# Patient Record
Sex: Male | Born: 1940
Health system: Southern US, Community
[De-identification: ages and names within clinical notes are randomized; demographics above are authoritative.]

## PROBLEM LIST (undated history)

## (undated) DIAGNOSIS — M1710 Unilateral primary osteoarthritis, unspecified knee: Secondary | ICD-10-CM

## (undated) DIAGNOSIS — Q2112 Patent foramen ovale: Secondary | ICD-10-CM

## (undated) DIAGNOSIS — K5900 Constipation, unspecified: Secondary | ICD-10-CM

## (undated) DIAGNOSIS — K922 Gastrointestinal hemorrhage, unspecified: Secondary | ICD-10-CM

## (undated) DIAGNOSIS — M19019 Primary osteoarthritis, unspecified shoulder: Secondary | ICD-10-CM

## (undated) DIAGNOSIS — R55 Syncope and collapse: Secondary | ICD-10-CM

## (undated) DIAGNOSIS — IMO0002 Reserved for concepts with insufficient information to code with codable children: Secondary | ICD-10-CM

## (undated) DIAGNOSIS — C801 Malignant (primary) neoplasm, unspecified: Secondary | ICD-10-CM

## (undated) DIAGNOSIS — G5600 Carpal tunnel syndrome, unspecified upper limb: Secondary | ICD-10-CM

## (undated) DIAGNOSIS — Q211 Atrial septal defect: Secondary | ICD-10-CM

## (undated) DIAGNOSIS — D509 Iron deficiency anemia, unspecified: Secondary | ICD-10-CM

## (undated) DIAGNOSIS — M6281 Muscle weakness (generalized): Secondary | ICD-10-CM

## (undated) DIAGNOSIS — I10 Essential (primary) hypertension: Secondary | ICD-10-CM

## (undated) DIAGNOSIS — G894 Chronic pain syndrome: Secondary | ICD-10-CM

## (undated) DIAGNOSIS — M4712 Other spondylosis with myelopathy, cervical region: Secondary | ICD-10-CM

## (undated) DIAGNOSIS — M47817 Spondylosis without myelopathy or radiculopathy, lumbosacral region: Secondary | ICD-10-CM

## (undated) DIAGNOSIS — E785 Hyperlipidemia, unspecified: Secondary | ICD-10-CM

## (undated) DIAGNOSIS — H409 Unspecified glaucoma: Secondary | ICD-10-CM

## (undated) DIAGNOSIS — F329 Major depressive disorder, single episode, unspecified: Secondary | ICD-10-CM

## (undated) DIAGNOSIS — T884XXA Failed or difficult intubation, initial encounter: Secondary | ICD-10-CM

## (undated) DIAGNOSIS — Z9889 Other specified postprocedural states: Secondary | ICD-10-CM

## (undated) DIAGNOSIS — F32A Depression, unspecified: Secondary | ICD-10-CM

## (undated) DIAGNOSIS — I4821 Permanent atrial fibrillation: Secondary | ICD-10-CM

## (undated) DIAGNOSIS — K2100 Gastro-esophageal reflux disease with esophagitis, without bleeding: Secondary | ICD-10-CM

## (undated) DIAGNOSIS — I639 Cerebral infarction, unspecified: Secondary | ICD-10-CM

## (undated) DIAGNOSIS — M4800 Spinal stenosis, site unspecified: Secondary | ICD-10-CM

## (undated) DIAGNOSIS — M5090 Cervical disc disorder, unspecified, unspecified cervical region: Secondary | ICD-10-CM

## (undated) DIAGNOSIS — I350 Nonrheumatic aortic (valve) stenosis: Secondary | ICD-10-CM

## (undated) DIAGNOSIS — R1313 Dysphagia, pharyngeal phase: Secondary | ICD-10-CM

## (undated) HISTORY — DX: Gastro-esophageal reflux disease with esophagitis, without bleeding: K21.00

## (undated) HISTORY — DX: Unilateral primary osteoarthritis, unspecified knee: M17.10

## (undated) HISTORY — DX: Depression, unspecified: F32.A

## (undated) HISTORY — DX: Hyperlipidemia, unspecified: E78.5

## (undated) HISTORY — DX: Primary osteoarthritis, unspecified shoulder: M19.019

## (undated) HISTORY — DX: Spinal stenosis, site unspecified: M48.00

## (undated) HISTORY — DX: Spondylosis without myelopathy or radiculopathy, lumbosacral region: M47.817

## (undated) HISTORY — DX: Chronic pain syndrome: G89.4

## (undated) HISTORY — DX: Gastrointestinal hemorrhage, unspecified: K92.2

## (undated) HISTORY — DX: Cervical disc disorder, unspecified, unspecified cervical region: M50.90

## (undated) HISTORY — PX: OTHER SURGICAL HISTORY: SHX169

## (undated) HISTORY — DX: Other spondylosis with myelopathy, cervical region: M47.12

## (undated) HISTORY — DX: Muscle weakness (generalized): M62.81

## (undated) HISTORY — DX: Essential (primary) hypertension: I10

## (undated) HISTORY — PX: JOINT REPLACEMENT: SHX530

## (undated) HISTORY — DX: Syncope and collapse: R55

## (undated) HISTORY — DX: Carpal tunnel syndrome, unspecified upper limb: G56.00

## (undated) HISTORY — DX: Reserved for concepts with insufficient information to code with codable children: IMO0002

## (undated) HISTORY — DX: Iron deficiency anemia, unspecified: D50.9

## (undated) HISTORY — PX: SPINE SURGERY: SHX786

## (undated) HISTORY — DX: Constipation, unspecified: K59.00

## (undated) HISTORY — DX: Dysphagia, pharyngeal phase: R13.13

## (undated) HISTORY — DX: Major depressive disorder, single episode, unspecified: F32.9

## (undated) HISTORY — DX: Gastro-esophageal reflux disease with esophagitis: K21.0

## (undated) HISTORY — DX: Unspecified glaucoma: H40.9

---

## 1998-04-06 ENCOUNTER — Encounter: Admission: RE | Admit: 1998-04-06 | Discharge: 1998-04-06 | Payer: Self-pay | Admitting: Family Medicine

## 1998-04-20 ENCOUNTER — Encounter: Admission: RE | Admit: 1998-04-20 | Discharge: 1998-04-20 | Payer: Self-pay | Admitting: Family Medicine

## 1998-06-15 ENCOUNTER — Encounter: Admission: RE | Admit: 1998-06-15 | Discharge: 1998-06-15 | Payer: Self-pay | Admitting: Family Medicine

## 1998-06-24 ENCOUNTER — Encounter: Admission: RE | Admit: 1998-06-24 | Discharge: 1998-06-24 | Payer: Self-pay | Admitting: Family Medicine

## 1998-07-08 ENCOUNTER — Encounter: Admission: RE | Admit: 1998-07-08 | Discharge: 1998-07-08 | Payer: Self-pay | Admitting: Family Medicine

## 1998-07-11 ENCOUNTER — Encounter: Admission: RE | Admit: 1998-07-11 | Discharge: 1998-10-09 | Payer: Self-pay | Admitting: Family Medicine

## 1998-10-07 ENCOUNTER — Encounter: Admission: RE | Admit: 1998-10-07 | Discharge: 1998-10-07 | Payer: Self-pay | Admitting: Family Medicine

## 1998-10-24 ENCOUNTER — Encounter: Admission: RE | Admit: 1998-10-24 | Discharge: 1998-10-24 | Payer: Self-pay | Admitting: Family Medicine

## 1999-07-11 ENCOUNTER — Encounter: Admission: RE | Admit: 1999-07-11 | Discharge: 1999-07-11 | Payer: Self-pay | Admitting: Family Medicine

## 1999-12-18 HISTORY — PX: OTHER SURGICAL HISTORY: SHX169

## 2000-02-02 ENCOUNTER — Encounter: Admission: RE | Admit: 2000-02-02 | Discharge: 2000-02-02 | Payer: Self-pay | Admitting: Family Medicine

## 2000-02-05 ENCOUNTER — Ambulatory Visit (HOSPITAL_COMMUNITY): Admission: RE | Admit: 2000-02-05 | Discharge: 2000-02-05 | Payer: Self-pay | Admitting: Family Medicine

## 2000-02-08 ENCOUNTER — Encounter: Admission: RE | Admit: 2000-02-08 | Discharge: 2000-02-08 | Payer: Self-pay | Admitting: Sports Medicine

## 2000-02-26 ENCOUNTER — Encounter: Payer: Self-pay | Admitting: Sports Medicine

## 2000-02-26 ENCOUNTER — Encounter: Admission: RE | Admit: 2000-02-26 | Discharge: 2000-02-26 | Payer: Self-pay

## 2000-02-28 ENCOUNTER — Encounter: Payer: Self-pay | Admitting: Family Medicine

## 2000-02-28 ENCOUNTER — Emergency Department (HOSPITAL_COMMUNITY): Admission: EM | Admit: 2000-02-28 | Discharge: 2000-02-28 | Payer: Self-pay | Admitting: Emergency Medicine

## 2000-03-01 ENCOUNTER — Encounter: Admission: RE | Admit: 2000-03-01 | Discharge: 2000-03-01 | Payer: Self-pay | Admitting: Family Medicine

## 2000-03-13 ENCOUNTER — Encounter: Payer: Self-pay | Admitting: Family Medicine

## 2000-03-13 ENCOUNTER — Encounter: Admission: RE | Admit: 2000-03-13 | Discharge: 2000-03-13 | Payer: Self-pay | Admitting: Family Medicine

## 2000-04-19 ENCOUNTER — Encounter: Admission: RE | Admit: 2000-04-19 | Discharge: 2000-04-19 | Payer: Self-pay | Admitting: Family Medicine

## 2001-02-28 ENCOUNTER — Encounter: Payer: Self-pay | Admitting: Internal Medicine

## 2001-02-28 ENCOUNTER — Encounter: Admission: RE | Admit: 2001-02-28 | Discharge: 2001-02-28 | Payer: Self-pay | Admitting: Internal Medicine

## 2001-04-15 ENCOUNTER — Encounter: Admission: RE | Admit: 2001-04-15 | Discharge: 2001-04-15 | Payer: Self-pay | Admitting: Sports Medicine

## 2001-04-30 ENCOUNTER — Encounter: Admission: RE | Admit: 2001-04-30 | Discharge: 2001-04-30 | Payer: Self-pay | Admitting: Family Medicine

## 2001-05-14 ENCOUNTER — Encounter: Admission: RE | Admit: 2001-05-14 | Discharge: 2001-05-14 | Payer: Self-pay | Admitting: Family Medicine

## 2002-02-25 ENCOUNTER — Encounter: Admission: RE | Admit: 2002-02-25 | Discharge: 2002-02-25 | Payer: Self-pay | Admitting: Family Medicine

## 2003-03-08 ENCOUNTER — Encounter: Admission: RE | Admit: 2003-03-08 | Discharge: 2003-03-08 | Payer: Self-pay | Admitting: Emergency Medicine

## 2003-03-08 ENCOUNTER — Encounter: Payer: Self-pay | Admitting: Emergency Medicine

## 2003-03-29 ENCOUNTER — Encounter: Payer: Self-pay | Admitting: Emergency Medicine

## 2003-03-29 ENCOUNTER — Encounter: Admission: RE | Admit: 2003-03-29 | Discharge: 2003-03-29 | Payer: Self-pay | Admitting: Emergency Medicine

## 2003-06-09 ENCOUNTER — Encounter: Admission: RE | Admit: 2003-06-09 | Discharge: 2003-06-09 | Payer: Self-pay | Admitting: Family Medicine

## 2003-11-17 ENCOUNTER — Encounter: Admission: RE | Admit: 2003-11-17 | Discharge: 2003-11-17 | Payer: Self-pay | Admitting: Family Medicine

## 2003-11-17 ENCOUNTER — Encounter: Admission: RE | Admit: 2003-11-17 | Discharge: 2003-11-17 | Payer: Self-pay | Admitting: Sports Medicine

## 2003-11-30 ENCOUNTER — Encounter: Admission: RE | Admit: 2003-11-30 | Discharge: 2003-11-30 | Payer: Self-pay | Admitting: Sports Medicine

## 2003-11-30 ENCOUNTER — Ambulatory Visit (HOSPITAL_COMMUNITY): Admission: RE | Admit: 2003-11-30 | Discharge: 2003-11-30 | Payer: Self-pay | Admitting: Sports Medicine

## 2003-12-01 ENCOUNTER — Encounter: Admission: RE | Admit: 2003-12-01 | Discharge: 2003-12-01 | Payer: Self-pay | Admitting: Sports Medicine

## 2003-12-07 ENCOUNTER — Encounter: Admission: RE | Admit: 2003-12-07 | Discharge: 2003-12-07 | Payer: Self-pay | Admitting: Sports Medicine

## 2003-12-08 ENCOUNTER — Encounter: Admission: RE | Admit: 2003-12-08 | Discharge: 2003-12-08 | Payer: Self-pay | Admitting: Sports Medicine

## 2003-12-15 ENCOUNTER — Ambulatory Visit (HOSPITAL_COMMUNITY): Admission: RE | Admit: 2003-12-15 | Discharge: 2003-12-15 | Payer: Self-pay | Admitting: Sports Medicine

## 2003-12-29 ENCOUNTER — Encounter: Admission: RE | Admit: 2003-12-29 | Discharge: 2003-12-29 | Payer: Self-pay | Admitting: Family Medicine

## 2004-02-09 ENCOUNTER — Encounter: Admission: RE | Admit: 2004-02-09 | Discharge: 2004-02-09 | Payer: Self-pay | Admitting: Family Medicine

## 2004-02-17 ENCOUNTER — Encounter: Admission: RE | Admit: 2004-02-17 | Discharge: 2004-02-17 | Payer: Self-pay | Admitting: Neurosurgery

## 2004-04-18 ENCOUNTER — Inpatient Hospital Stay (HOSPITAL_COMMUNITY): Admission: RE | Admit: 2004-04-18 | Discharge: 2004-04-21 | Payer: Self-pay | Admitting: Neurosurgery

## 2004-12-04 ENCOUNTER — Inpatient Hospital Stay (HOSPITAL_COMMUNITY): Admission: EM | Admit: 2004-12-04 | Discharge: 2004-12-07 | Payer: Self-pay | Admitting: Emergency Medicine

## 2004-12-04 ENCOUNTER — Ambulatory Visit: Payer: Self-pay | Admitting: Family Medicine

## 2004-12-13 ENCOUNTER — Ambulatory Visit: Payer: Self-pay | Admitting: Family Medicine

## 2004-12-17 HISTORY — PX: SHOULDER OPEN ROTATOR CUFF REPAIR: SHX2407

## 2005-03-07 ENCOUNTER — Ambulatory Visit: Payer: Self-pay | Admitting: Family Medicine

## 2005-03-22 ENCOUNTER — Ambulatory Visit: Payer: Self-pay | Admitting: Family Medicine

## 2005-03-28 ENCOUNTER — Encounter: Admission: RE | Admit: 2005-03-28 | Discharge: 2005-03-28 | Payer: Self-pay | Admitting: Neurosurgery

## 2005-04-11 ENCOUNTER — Ambulatory Visit: Payer: Self-pay | Admitting: Family Medicine

## 2005-04-23 ENCOUNTER — Ambulatory Visit: Payer: Self-pay | Admitting: Family Medicine

## 2005-05-18 ENCOUNTER — Inpatient Hospital Stay (HOSPITAL_COMMUNITY): Admission: RE | Admit: 2005-05-18 | Discharge: 2005-05-28 | Payer: Self-pay | Admitting: Neurosurgery

## 2005-06-20 ENCOUNTER — Ambulatory Visit: Payer: Self-pay | Admitting: Family Medicine

## 2005-08-15 ENCOUNTER — Ambulatory Visit: Payer: Self-pay | Admitting: Family Medicine

## 2005-09-07 ENCOUNTER — Ambulatory Visit (HOSPITAL_COMMUNITY): Admission: RE | Admit: 2005-09-07 | Discharge: 2005-09-08 | Payer: Self-pay | Admitting: Orthopaedic Surgery

## 2005-09-12 ENCOUNTER — Ambulatory Visit: Payer: Self-pay | Admitting: Family Medicine

## 2005-10-09 ENCOUNTER — Ambulatory Visit (HOSPITAL_COMMUNITY): Admission: RE | Admit: 2005-10-09 | Discharge: 2005-10-09 | Payer: Self-pay | Admitting: Orthopaedic Surgery

## 2005-11-21 ENCOUNTER — Ambulatory Visit: Payer: Self-pay | Admitting: Family Medicine

## 2005-12-03 ENCOUNTER — Ambulatory Visit: Payer: Self-pay | Admitting: Family Medicine

## 2005-12-17 HISTORY — PX: COLONOSCOPY: SHX174

## 2005-12-19 ENCOUNTER — Ambulatory Visit: Payer: Self-pay | Admitting: Family Medicine

## 2006-01-16 ENCOUNTER — Ambulatory Visit: Payer: Self-pay | Admitting: Family Medicine

## 2006-02-06 ENCOUNTER — Ambulatory Visit (HOSPITAL_COMMUNITY): Admission: RE | Admit: 2006-02-06 | Discharge: 2006-02-06 | Payer: Self-pay | Admitting: Gastroenterology

## 2006-02-20 ENCOUNTER — Ambulatory Visit: Payer: Self-pay | Admitting: Family Medicine

## 2006-03-13 ENCOUNTER — Ambulatory Visit: Payer: Self-pay | Admitting: Family Medicine

## 2006-05-09 ENCOUNTER — Ambulatory Visit (HOSPITAL_COMMUNITY): Admission: RE | Admit: 2006-05-09 | Discharge: 2006-05-09 | Payer: Self-pay | Admitting: Anesthesiology

## 2006-05-20 ENCOUNTER — Encounter: Admission: RE | Admit: 2006-05-20 | Discharge: 2006-05-20 | Payer: Self-pay | Admitting: Sports Medicine

## 2006-05-20 ENCOUNTER — Ambulatory Visit: Payer: Self-pay | Admitting: Family Medicine

## 2006-05-22 ENCOUNTER — Ambulatory Visit: Payer: Self-pay | Admitting: Family Medicine

## 2006-06-17 ENCOUNTER — Ambulatory Visit: Payer: Self-pay | Admitting: Family Medicine

## 2006-06-18 ENCOUNTER — Encounter: Admission: RE | Admit: 2006-06-18 | Discharge: 2006-06-18 | Payer: Self-pay | Admitting: Family Medicine

## 2006-07-01 ENCOUNTER — Ambulatory Visit: Payer: Self-pay | Admitting: Family Medicine

## 2006-07-31 ENCOUNTER — Ambulatory Visit: Payer: Self-pay | Admitting: Family Medicine

## 2006-08-21 ENCOUNTER — Ambulatory Visit: Payer: Self-pay | Admitting: Family Medicine

## 2006-08-26 ENCOUNTER — Encounter: Admission: RE | Admit: 2006-08-26 | Discharge: 2006-11-24 | Payer: Self-pay | Admitting: Family Medicine

## 2006-09-12 ENCOUNTER — Encounter: Admission: RE | Admit: 2006-09-12 | Discharge: 2006-09-12 | Payer: Self-pay | Admitting: Neurosurgery

## 2006-10-17 ENCOUNTER — Ambulatory Visit: Payer: Self-pay | Admitting: Sports Medicine

## 2006-10-17 ENCOUNTER — Ambulatory Visit (HOSPITAL_COMMUNITY): Admission: RE | Admit: 2006-10-17 | Discharge: 2006-10-17 | Payer: Self-pay | Admitting: Family Medicine

## 2006-10-25 ENCOUNTER — Encounter: Admission: RE | Admit: 2006-10-25 | Discharge: 2006-10-25 | Payer: Self-pay | Admitting: Neurosurgery

## 2006-11-06 ENCOUNTER — Encounter: Admission: RE | Admit: 2006-11-06 | Discharge: 2006-11-06 | Payer: Self-pay | Admitting: Neurosurgery

## 2006-11-18 ENCOUNTER — Inpatient Hospital Stay (HOSPITAL_COMMUNITY): Admission: RE | Admit: 2006-11-18 | Discharge: 2006-11-22 | Payer: Self-pay | Admitting: Orthopedic Surgery

## 2007-01-01 ENCOUNTER — Encounter (INDEPENDENT_AMBULATORY_CARE_PROVIDER_SITE_OTHER): Payer: Self-pay | Admitting: Family Medicine

## 2007-01-01 ENCOUNTER — Ambulatory Visit: Payer: Self-pay | Admitting: Sports Medicine

## 2007-01-01 LAB — CONVERTED CEMR LAB
BUN: 20 mg/dL (ref 6–23)
CO2: 30 meq/L (ref 19–32)
Glucose, Bld: 77 mg/dL (ref 70–99)
Potassium: 4.8 meq/L (ref 3.5–5.3)
TSH: 1.571 microintl units/mL (ref 0.350–5.50)

## 2007-01-28 ENCOUNTER — Inpatient Hospital Stay (HOSPITAL_COMMUNITY): Admission: AD | Admit: 2007-01-28 | Discharge: 2007-01-31 | Payer: Self-pay | Admitting: Neurosurgery

## 2007-02-06 ENCOUNTER — Ambulatory Visit: Payer: Self-pay | Admitting: Family Medicine

## 2007-02-19 ENCOUNTER — Telehealth: Payer: Self-pay | Admitting: *Deleted

## 2007-03-07 ENCOUNTER — Encounter (INDEPENDENT_AMBULATORY_CARE_PROVIDER_SITE_OTHER): Payer: Self-pay | Admitting: Family Medicine

## 2007-03-07 ENCOUNTER — Ambulatory Visit: Payer: Self-pay | Admitting: Family Medicine

## 2007-03-07 DIAGNOSIS — I10 Essential (primary) hypertension: Secondary | ICD-10-CM

## 2007-03-07 DIAGNOSIS — E7800 Pure hypercholesterolemia, unspecified: Secondary | ICD-10-CM

## 2007-03-07 LAB — CONVERTED CEMR LAB
ALT: 13 units/L (ref 0–53)
BUN: 22 mg/dL (ref 6–23)
CO2: 27 meq/L (ref 19–32)
Cholesterol: 151 mg/dL (ref 0–200)
Creatinine, Ser: 0.77 mg/dL (ref 0.40–1.50)
HDL: 45 mg/dL (ref >39)
Total Bilirubin: 0.2 mg/dL — ABNORMAL LOW (ref 0.3–1.2)
Total CHOL/HDL Ratio: 3.4
Triglycerides: 127 mg/dL (ref <150)
VLDL: 25 mg/dL (ref 0–40)

## 2007-03-13 ENCOUNTER — Telehealth: Payer: Self-pay | Admitting: *Deleted

## 2007-04-08 ENCOUNTER — Ambulatory Visit: Payer: Self-pay | Admitting: Family Medicine

## 2007-04-08 ENCOUNTER — Encounter (INDEPENDENT_AMBULATORY_CARE_PROVIDER_SITE_OTHER): Payer: Self-pay | Admitting: Family Medicine

## 2007-04-08 DIAGNOSIS — Q762 Congenital spondylolisthesis: Secondary | ICD-10-CM

## 2007-04-08 DIAGNOSIS — G8929 Other chronic pain: Secondary | ICD-10-CM | POA: Insufficient documentation

## 2007-04-08 DIAGNOSIS — F411 Generalized anxiety disorder: Secondary | ICD-10-CM

## 2007-04-08 DIAGNOSIS — M159 Polyosteoarthritis, unspecified: Secondary | ICD-10-CM | POA: Insufficient documentation

## 2007-04-08 DIAGNOSIS — N3941 Urge incontinence: Secondary | ICD-10-CM

## 2007-04-08 LAB — CONVERTED CEMR LAB
Bilirubin Urine: NEGATIVE
Nitrite: NEGATIVE
Protein, U semiquant: NEGATIVE
Urobilinogen, UA: 0.2
WBC Urine, dipstick: NEGATIVE

## 2007-04-09 ENCOUNTER — Encounter (INDEPENDENT_AMBULATORY_CARE_PROVIDER_SITE_OTHER): Payer: Self-pay | Admitting: Family Medicine

## 2007-04-09 ENCOUNTER — Telehealth (INDEPENDENT_AMBULATORY_CARE_PROVIDER_SITE_OTHER): Payer: Self-pay | Admitting: Family Medicine

## 2007-04-14 ENCOUNTER — Telehealth: Payer: Self-pay | Admitting: *Deleted

## 2007-04-18 ENCOUNTER — Telehealth (INDEPENDENT_AMBULATORY_CARE_PROVIDER_SITE_OTHER): Payer: Self-pay | Admitting: Family Medicine

## 2007-04-25 ENCOUNTER — Telehealth (INDEPENDENT_AMBULATORY_CARE_PROVIDER_SITE_OTHER): Payer: Self-pay | Admitting: Family Medicine

## 2007-05-05 ENCOUNTER — Encounter (INDEPENDENT_AMBULATORY_CARE_PROVIDER_SITE_OTHER): Payer: Self-pay | Admitting: Family Medicine

## 2007-05-05 ENCOUNTER — Ambulatory Visit: Payer: Self-pay | Admitting: Sports Medicine

## 2007-05-05 ENCOUNTER — Telehealth (INDEPENDENT_AMBULATORY_CARE_PROVIDER_SITE_OTHER): Payer: Self-pay | Admitting: Family Medicine

## 2007-05-05 DIAGNOSIS — IMO0002 Reserved for concepts with insufficient information to code with codable children: Secondary | ICD-10-CM | POA: Insufficient documentation

## 2007-05-06 ENCOUNTER — Ambulatory Visit: Payer: Self-pay | Admitting: Family Medicine

## 2007-05-08 ENCOUNTER — Encounter: Payer: Self-pay | Admitting: *Deleted

## 2007-05-09 ENCOUNTER — Inpatient Hospital Stay (HOSPITAL_COMMUNITY): Admission: RE | Admit: 2007-05-09 | Discharge: 2007-05-12 | Payer: Self-pay | Admitting: Orthopedic Surgery

## 2007-05-22 ENCOUNTER — Telehealth (INDEPENDENT_AMBULATORY_CARE_PROVIDER_SITE_OTHER): Payer: Self-pay | Admitting: Family Medicine

## 2007-05-23 ENCOUNTER — Telehealth: Payer: Self-pay | Admitting: *Deleted

## 2007-06-04 ENCOUNTER — Ambulatory Visit: Payer: Self-pay | Admitting: Family Medicine

## 2007-06-04 DIAGNOSIS — Z96659 Presence of unspecified artificial knee joint: Secondary | ICD-10-CM

## 2007-07-10 ENCOUNTER — Encounter: Admission: RE | Admit: 2007-07-10 | Discharge: 2007-07-10 | Payer: Self-pay | Admitting: Neurosurgery

## 2007-07-14 ENCOUNTER — Telehealth (INDEPENDENT_AMBULATORY_CARE_PROVIDER_SITE_OTHER): Payer: Self-pay | Admitting: Family Medicine

## 2007-07-22 ENCOUNTER — Emergency Department (HOSPITAL_COMMUNITY): Admission: EM | Admit: 2007-07-22 | Discharge: 2007-07-22 | Payer: Self-pay | Admitting: Emergency Medicine

## 2007-08-22 ENCOUNTER — Inpatient Hospital Stay (HOSPITAL_COMMUNITY): Admission: RE | Admit: 2007-08-22 | Discharge: 2007-08-27 | Payer: Self-pay | Admitting: Neurosurgery

## 2007-12-18 HISTORY — PX: OTHER SURGICAL HISTORY: SHX169

## 2008-01-06 ENCOUNTER — Ambulatory Visit (HOSPITAL_BASED_OUTPATIENT_CLINIC_OR_DEPARTMENT_OTHER): Admission: RE | Admit: 2008-01-06 | Discharge: 2008-01-06 | Payer: Self-pay | Admitting: Orthopedic Surgery

## 2008-01-06 HISTORY — PX: OTHER SURGICAL HISTORY: SHX169

## 2008-01-14 ENCOUNTER — Encounter: Admission: RE | Admit: 2008-01-14 | Discharge: 2008-01-14 | Payer: Self-pay | Admitting: Gastroenterology

## 2008-01-29 ENCOUNTER — Encounter: Admission: RE | Admit: 2008-01-29 | Discharge: 2008-02-17 | Payer: Self-pay | Admitting: Neurology

## 2008-03-04 ENCOUNTER — Inpatient Hospital Stay (HOSPITAL_COMMUNITY): Admission: EM | Admit: 2008-03-04 | Discharge: 2008-03-09 | Payer: Self-pay | Admitting: Emergency Medicine

## 2008-03-26 ENCOUNTER — Inpatient Hospital Stay (HOSPITAL_COMMUNITY): Admission: EM | Admit: 2008-03-26 | Discharge: 2008-04-02 | Payer: Self-pay | Admitting: Emergency Medicine

## 2008-03-30 ENCOUNTER — Encounter: Payer: Self-pay | Admitting: Neurosurgery

## 2008-03-31 ENCOUNTER — Ambulatory Visit: Payer: Self-pay | Admitting: Physical Medicine & Rehabilitation

## 2008-04-02 ENCOUNTER — Inpatient Hospital Stay (HOSPITAL_COMMUNITY)
Admission: RE | Admit: 2008-04-02 | Discharge: 2008-04-28 | Payer: Self-pay | Admitting: Physical Medicine & Rehabilitation

## 2008-04-02 ENCOUNTER — Ambulatory Visit: Payer: Self-pay | Admitting: Physical Medicine & Rehabilitation

## 2008-05-18 ENCOUNTER — Ambulatory Visit (HOSPITAL_COMMUNITY): Admission: RE | Admit: 2008-05-18 | Discharge: 2008-05-18 | Payer: Self-pay | Admitting: Neurosurgery

## 2008-05-28 ENCOUNTER — Emergency Department (HOSPITAL_COMMUNITY): Admission: EM | Admit: 2008-05-28 | Discharge: 2008-05-28 | Payer: Self-pay | Admitting: Emergency Medicine

## 2008-09-16 ENCOUNTER — Encounter: Admission: RE | Admit: 2008-09-16 | Discharge: 2008-09-16 | Payer: Self-pay | Admitting: Neurosurgery

## 2008-11-13 ENCOUNTER — Emergency Department (HOSPITAL_COMMUNITY): Admission: EM | Admit: 2008-11-13 | Discharge: 2008-11-13 | Payer: Self-pay | Admitting: Emergency Medicine

## 2008-12-21 ENCOUNTER — Encounter: Admission: RE | Admit: 2008-12-21 | Discharge: 2008-12-21 | Payer: Self-pay | Admitting: *Deleted

## 2009-01-21 ENCOUNTER — Encounter: Admission: RE | Admit: 2009-01-21 | Discharge: 2009-01-21 | Payer: Self-pay | Admitting: *Deleted

## 2009-02-21 ENCOUNTER — Encounter: Admission: RE | Admit: 2009-02-21 | Discharge: 2009-02-21 | Payer: Self-pay | Admitting: *Deleted

## 2009-09-14 ENCOUNTER — Encounter: Admission: RE | Admit: 2009-09-14 | Discharge: 2009-09-14 | Payer: Self-pay | Admitting: Gastroenterology

## 2009-11-04 ENCOUNTER — Encounter: Admission: RE | Admit: 2009-11-04 | Discharge: 2009-11-04 | Payer: Self-pay | Admitting: Internal Medicine

## 2009-12-30 ENCOUNTER — Encounter: Admission: RE | Admit: 2009-12-30 | Discharge: 2009-12-30 | Payer: Self-pay | Admitting: Otolaryngology

## 2010-03-13 ENCOUNTER — Emergency Department (HOSPITAL_COMMUNITY): Admission: EM | Admit: 2010-03-13 | Discharge: 2010-03-13 | Payer: Self-pay | Admitting: Emergency Medicine

## 2010-04-24 ENCOUNTER — Inpatient Hospital Stay (HOSPITAL_COMMUNITY): Admission: EM | Admit: 2010-04-24 | Discharge: 2010-04-26 | Payer: Self-pay | Admitting: Emergency Medicine

## 2010-04-25 ENCOUNTER — Encounter (INDEPENDENT_AMBULATORY_CARE_PROVIDER_SITE_OTHER): Payer: Self-pay | Admitting: Internal Medicine

## 2010-06-17 ENCOUNTER — Encounter: Admission: RE | Admit: 2010-06-17 | Discharge: 2010-06-17 | Payer: Self-pay | Admitting: Internal Medicine

## 2010-12-05 ENCOUNTER — Encounter
Admission: RE | Admit: 2010-12-05 | Discharge: 2010-12-05 | Payer: Self-pay | Source: Home / Self Care | Attending: Neurosurgery | Admitting: Neurosurgery

## 2011-01-06 ENCOUNTER — Other Ambulatory Visit (HOSPITAL_BASED_OUTPATIENT_CLINIC_OR_DEPARTMENT_OTHER): Payer: Self-pay | Admitting: Internal Medicine

## 2011-01-06 DIAGNOSIS — R918 Other nonspecific abnormal finding of lung field: Secondary | ICD-10-CM

## 2011-01-07 ENCOUNTER — Encounter: Payer: Self-pay | Admitting: Neurosurgery

## 2011-01-12 ENCOUNTER — Encounter
Admission: RE | Admit: 2011-01-12 | Discharge: 2011-01-12 | Payer: Self-pay | Source: Home / Self Care | Attending: Otolaryngology | Admitting: Otolaryngology

## 2011-01-31 ENCOUNTER — Other Ambulatory Visit: Payer: Self-pay

## 2011-01-31 ENCOUNTER — Ambulatory Visit
Admission: RE | Admit: 2011-01-31 | Discharge: 2011-01-31 | Disposition: A | Discharge status: Home / Self Care | Payer: PRIVATE HEALTH INSURANCE | Source: Ambulatory Visit | Attending: Internal Medicine | Admitting: Internal Medicine

## 2011-01-31 DIAGNOSIS — R918 Other nonspecific abnormal finding of lung field: Secondary | ICD-10-CM

## 2011-01-31 MED ORDER — IOHEXOL 300 MG/ML  SOLN
75.0000 mL | Freq: Once | INTRAMUSCULAR | Status: AC | PRN
  Administered 2011-01-31: 75 mL via INTRAVENOUS

## 2011-02-05 ENCOUNTER — Encounter: Payer: Self-pay | Admitting: *Deleted

## 2011-03-06 LAB — BLOOD GAS, ARTERIAL
Acid-Base Excess: 4.1 mmol/L — ABNORMAL HIGH (ref 0.0–2.0)
O2 Saturation: 95.9 %
TCO2: 28.2 mmol/L (ref 0–100)
pCO2 arterial: 51.1 mmHg — ABNORMAL HIGH (ref 35.0–45.0)
pO2, Arterial: 112 mmHg — ABNORMAL HIGH (ref 80.0–100.0)

## 2011-03-06 LAB — TYPE AND SCREEN: Antibody Screen: NEGATIVE

## 2011-03-06 LAB — CBC
HCT: 26.9 % — ABNORMAL LOW (ref 39.0–52.0)
HCT: 26.9 % — ABNORMAL LOW (ref 39.0–52.0)
HCT: 27.2 % — ABNORMAL LOW (ref 39.0–52.0)
HCT: 27.6 % — ABNORMAL LOW (ref 39.0–52.0)
HCT: 29.4 % — ABNORMAL LOW (ref 39.0–52.0)
Hemoglobin: 8.5 g/dL — ABNORMAL LOW (ref 13.0–17.0)
Hemoglobin: 9.1 g/dL — ABNORMAL LOW (ref 13.0–17.0)
MCHC: 31.2 g/dL (ref 30.0–36.0)
MCV: 74.3 fL — ABNORMAL LOW (ref 78.0–100.0)
MCV: 74.8 fL — ABNORMAL LOW (ref 78.0–100.0)
MCV: 76.1 fL — ABNORMAL LOW (ref 78.0–100.0)
Platelets: 271 10*3/uL (ref 150–400)
Platelets: 272 10*3/uL (ref 150–400)
Platelets: 297 10*3/uL (ref 150–400)
Platelets: 300 10*3/uL (ref 150–400)
RBC: 3.62 MIL/uL — ABNORMAL LOW (ref 4.22–5.81)
RBC: 3.85 MIL/uL — ABNORMAL LOW (ref 4.22–5.81)
RDW: 17.8 % — ABNORMAL HIGH (ref 11.5–15.5)
RDW: 18.3 % — ABNORMAL HIGH (ref 11.5–15.5)
RDW: 18.3 % — ABNORMAL HIGH (ref 11.5–15.5)
WBC: 5.9 10*3/uL (ref 4.0–10.5)
WBC: 5.9 10*3/uL (ref 4.0–10.5)
WBC: 6.4 10*3/uL (ref 4.0–10.5)

## 2011-03-06 LAB — URINALYSIS, ROUTINE W REFLEX MICROSCOPIC
Bilirubin Urine: NEGATIVE
Nitrite: NEGATIVE
Specific Gravity, Urine: 1.013 (ref 1.005–1.030)
Urobilinogen, UA: 0.2 mg/dL (ref 0.0–1.0)
pH: 5.5 (ref 5.0–8.0)

## 2011-03-06 LAB — DIFFERENTIAL
Eosinophils Relative: 6 % — ABNORMAL HIGH (ref 0–5)
Lymphocytes Relative: 17 % (ref 12–46)
Lymphs Abs: 1 10*3/uL (ref 0.7–4.0)
Monocytes Relative: 10 % (ref 3–12)

## 2011-03-06 LAB — CARDIAC PANEL(CRET KIN+CKTOT+MB+TROPI)
CK, MB: 1.8 ng/mL (ref 0.3–4.0)
Troponin I: 0.01 ng/mL (ref 0.00–0.06)

## 2011-03-06 LAB — POCT CARDIAC MARKERS
Myoglobin, poc: 116 ng/mL (ref 12–200)
Troponin i, poc: <0.05 ng/mL (ref 0.00–0.09)
Troponin i, poc: <0.05 ng/mL (ref 0.00–0.09)

## 2011-03-06 LAB — BASIC METABOLIC PANEL
BUN: 8 mg/dL (ref 6–23)
CO2: 31 mEq/L (ref 19–32)
Calcium: 9.3 mg/dL (ref 8.4–10.5)
Chloride: 102 mEq/L (ref 96–112)
GFR calc Af Amer: >60 mL/min (ref >60)
GFR calc non Af Amer: >60 mL/min (ref >60)
Glucose, Bld: 81 mg/dL (ref 70–99)
Potassium: 3.6 mEq/L (ref 3.5–5.1)
Potassium: 4.7 mEq/L (ref 3.5–5.1)
Sodium: 137 mEq/L (ref 135–145)

## 2011-03-06 LAB — CULTURE, BLOOD (ROUTINE X 2)

## 2011-03-06 LAB — LACTIC ACID, PLASMA: Lactic Acid, Venous: 1.1 mmol/L (ref 0.5–2.2)

## 2011-03-06 LAB — RETICULOCYTES
Retic Count, Absolute: 50.1 10*3/uL (ref 19.0–186.0)
Retic Ct Pct: 1.4 % (ref 0.4–3.1)

## 2011-03-06 LAB — BRAIN NATRIURETIC PEPTIDE: Pro B Natriuretic peptide (BNP): 40.5 pg/mL (ref 0.0–100.0)

## 2011-03-06 LAB — MRSA PCR SCREENING: MRSA by PCR: NEGATIVE

## 2011-03-06 LAB — ETHANOL: Alcohol, Ethyl (B): <5 mg/dL (ref 0–10)

## 2011-03-06 LAB — CK TOTAL AND CKMB (NOT AT ARMC)
CK, MB: 2.3 ng/mL (ref 0.3–4.0)
Relative Index: 2.1 (ref 0.0–2.5)

## 2011-03-06 LAB — HEMOCCULT GUIAC POC 1CARD (OFFICE): Fecal Occult Bld: POSITIVE

## 2011-03-06 LAB — D-DIMER, QUANTITATIVE: D-Dimer, Quant: 0.73 ug/mL-FEU — ABNORMAL HIGH (ref 0.00–0.48)

## 2011-03-06 LAB — FOLATE: Folate: >20 ng/mL

## 2011-04-10 ENCOUNTER — Encounter: Payer: Medicare Other | Attending: Physical Medicine & Rehabilitation | Admitting: Physical Medicine & Rehabilitation

## 2011-04-10 DIAGNOSIS — Z981 Arthrodesis status: Secondary | ICD-10-CM | POA: Insufficient documentation

## 2011-04-10 DIAGNOSIS — M19019 Primary osteoarthritis, unspecified shoulder: Secondary | ICD-10-CM

## 2011-04-10 DIAGNOSIS — H409 Unspecified glaucoma: Secondary | ICD-10-CM | POA: Insufficient documentation

## 2011-04-10 DIAGNOSIS — M5137 Other intervertebral disc degeneration, lumbosacral region: Secondary | ICD-10-CM | POA: Insufficient documentation

## 2011-04-10 DIAGNOSIS — R5381 Other malaise: Secondary | ICD-10-CM | POA: Insufficient documentation

## 2011-04-10 DIAGNOSIS — M51379 Other intervertebral disc degeneration, lumbosacral region without mention of lumbar back pain or lower extremity pain: Secondary | ICD-10-CM | POA: Insufficient documentation

## 2011-04-10 DIAGNOSIS — F3289 Other specified depressive episodes: Secondary | ICD-10-CM | POA: Insufficient documentation

## 2011-04-10 DIAGNOSIS — G894 Chronic pain syndrome: Secondary | ICD-10-CM | POA: Insufficient documentation

## 2011-04-10 DIAGNOSIS — Z79899 Other long term (current) drug therapy: Secondary | ICD-10-CM | POA: Insufficient documentation

## 2011-04-10 DIAGNOSIS — M47817 Spondylosis without myelopathy or radiculopathy, lumbosacral region: Secondary | ICD-10-CM | POA: Insufficient documentation

## 2011-04-10 DIAGNOSIS — M1710 Unilateral primary osteoarthritis, unspecified knee: Secondary | ICD-10-CM

## 2011-04-10 DIAGNOSIS — Z7982 Long term (current) use of aspirin: Secondary | ICD-10-CM | POA: Insufficient documentation

## 2011-04-10 DIAGNOSIS — K219 Gastro-esophageal reflux disease without esophagitis: Secondary | ICD-10-CM | POA: Insufficient documentation

## 2011-04-10 DIAGNOSIS — M545 Low back pain, unspecified: Secondary | ICD-10-CM | POA: Insufficient documentation

## 2011-04-10 DIAGNOSIS — I10 Essential (primary) hypertension: Secondary | ICD-10-CM | POA: Insufficient documentation

## 2011-04-10 DIAGNOSIS — M159 Polyosteoarthritis, unspecified: Secondary | ICD-10-CM | POA: Insufficient documentation

## 2011-04-10 DIAGNOSIS — F329 Major depressive disorder, single episode, unspecified: Secondary | ICD-10-CM | POA: Insufficient documentation

## 2011-04-10 DIAGNOSIS — M47812 Spondylosis without myelopathy or radiculopathy, cervical region: Secondary | ICD-10-CM | POA: Insufficient documentation

## 2011-04-10 DIAGNOSIS — E785 Hyperlipidemia, unspecified: Secondary | ICD-10-CM | POA: Insufficient documentation

## 2011-04-10 NOTE — Group Therapy Note (Signed)
CHIEF COMPLAINT:  Diffuse body pain.  HISTORY OF PRESENT ILLNESS:  This is a 70 year old white male referred to [place] by Dr. Baltazar Najjar regarding his chronic pain syndrome.  The patient has a prolonged history of problems.  He has had 4 neck surgeries by Dr. Channing Mutters, culminating in a cervical fusion from occiput to T1 on March 13, 2010.  He has had prolonged low back pain with a lumbar spine film from 2010, showing multiple level spondylosis and degenerative disk disease with central stenosis L3-L4 and L4-L5 without gross nerve root compression.  He has chronic extraforaminal nerve encroachment bilaterally at L5 and possibly right L4-L3.  There is no change from a prior MRI in 2009.  He has had multiple surgical procedures to the shoulders by Dr. Rennis Chris apparently.  He has had left knee replacement by Dr. Sherlean Foot as well as right knee surgery.  He has been on narcotics for some time.  Most recently, he has been on OxyContin which is now 40 mg q.6 h.  He uses oxycodone/Percocet one q.4 h. p.r.n.  The patient describes the oxycodone listed on our sheet today, but Percocet's noted on his list from [facility].  He is taking Cymbalta currently for pain and mood as well as Valium for spasms p.r.n. as well as to help with sleep.  The patient rates his pain at 5-6/10, described as sharp and tingling.  The pain interferes with general activities, relation with others, enjoyment of life on a moderate-to-severe level.  Pain is worse with walking and any type of daytime activities.  He has problems with sleeping at night also.  The patient is generally a bit of a poor historian during her visit today.  REVIEW OF SYSTEMS:  Notable for the above.  He denies any shortness of breath, chest pain.  He has had reflux in the past, but states it is no longer a problem.  Full 12-point reviews in the written health and history section of the chart.  PAST MEDICAL HISTORY:  Notable for the above as well  as hyperlipidemia, iron deficiency anemia, depression, carpal tunnel syndrome with a study done in 2008, showing severe bilateral carpal tunnel syndrome and I believe he has had surgery for that.  Glaucoma, hypertension, chronic sinusitis, reflux, history of upper GI bleed which is remote for the patient and dysuria.  ALLERGIES:  None.  MEDICATIONS: 1. Aspirin 81 mg daily. 2. Wellbutrin 150 mg SR 1 pill 2 times a day. 3. Cymbalta 60 mg once a day. 4. Diazepam 5 mg twice a day p.r.n. 5. OxyContin 40 mg q.6 h. 6. Simvastatin 10 mg daily. 7. Oxycodone 10 one q.6 h. p.r.n. 8. Tizanidine 4 mg twice a day p.r.n.  SOCIAL HISTORY:  The patient lives alone.  He denies any smoking or drinking.  Currently, he is a previous smoker.  The patient does state that he does try to stretch and do some exercises on a twice a week basis.  FAMILY HISTORY:  Notable for father with hypertension, but otherwise unremarkable.  PHYSICAL EXAMINATION:  VITAL SIGNS:  Blood pressure is 142/93, pulse 86, respiratory rate 18 and he is satting 93% on room air. GENERAL:  The patient is sitting in the chair.  Upon my entering, fairly comfortable, although posture is quite rigid.  He had difficulty standing from a seated position.  His neck range is limited and he is in a fixed forward position.  Low back range of motion is limited to about 40 degrees  of flexion, about 10 degrees of rotation and lateral bending in either size as well as lumbar extension.  All movements seem to cause some pain.  He had little pain with lumbar palpation.  Left hemipelvis may have been a bit elevated over the right.  There was no gross scoliosis, although it had flattening of the lordotic curve. EXTREMITIES:  Strength appeared to be generally 4/5 in the upper extremities, proximal and distal.  He is limited with shoulder movements due to pain and had perhaps a bit of intrinsic muscle weakness on either side.  He complained of palmar  numbness on either hand today.  There was some mild thenar wasting.  Lower extremity strength is grossly 4/5 with some pain inhibition proximally.  Reflexes are diffusely 1+ throughout the upper and lower extremities to absent.  No focal sensory loss in the legs.  The patient definitely had tightness with hamstring stretching and seated slump and the straight leg testing revealed hamstring tightness this morning anything else today.  The patient had some mild crepitus in the knees with flexion and extension, but minimal pain with rotational forces and valgus and varus stresses.  The patient walks very rigid posture with slightly wide base and waddling.  NEUROLOGIC: Cognitively, other then being a bit tangential and losing focus at times, cognition was fair.  He was appropriate behaviorally.  Cranial nerve exam is grossly intact.  ASSESSMENT: 1. Chronic pain syndrome related to multiple issues including     degenerative joint disease of the bilateral shoulders, bilateral     knees as well as significant substantial cervical spondylosis and     stenosis as well as lumbar spondylosis with some stenosis.     Frankly, the patient has become quite deconditioned due to his pain     issues and lack of overall mobility.  Mood I am sure has played a     role here as well. 2. History of depression.  PLAN: 1. I am not left a great deal of options here considering the     extraordinary amount of surgery and procedures as well as treatment     he has been provided already.  The OxyContin CR at a q.6 h.     schedule is less than ideal.  It will be difficult transitioning to     another long-acting opiate.  I think the way to start is to change     his OxyContin to 60 mg q.8 h. in effort to stretch out the interval     period.  At that point, we can look at going to a 12-hour regimen     with another medication.  I told Mr. Rowlette that there certainly     will be some bumps in the road if he wants  to move off the     OxyContin.  He states he does not like how the OxyContin  makes him     feel and he does want to come off of it.  We can stay with the     oxycodone or Percocet for breakthrough pain at the moment.  No     medications were prescribed today as per our policy.  A urine drug     screen was collected and we will review that first.  We will have     him come back prospectively in about 2 weeks to see a nurse     practitioner to initiate the OxyContin as above as well as his  breakthrough pain medication and oxycodone. 2. I did initiate Mobic 7.5 mg daily watching closely for any GI side     effects.  The patient states that he has changed his diet and     reflux is no longer a problem.  He will take the Mobic with food     daily. 3. We will increase tizanidine to 4 mg one q.6 h. p.r.n. 4. I would like him to get into physical therapy as well. 5. We will see him back per above.     Ranelle Oyster, M.D. Electronically Signed    ZTS/MedQ D:  04/10/2011 11:35:07  T:  04/10/2011 23:48:15  Job #:  [id number]  cc:   Maxwell Caul, M.D.

## 2011-04-24 ENCOUNTER — Encounter: Payer: Medicare Other | Attending: Physical Medicine & Rehabilitation | Admitting: Neurosurgery

## 2011-04-24 DIAGNOSIS — R5381 Other malaise: Secondary | ICD-10-CM | POA: Insufficient documentation

## 2011-04-24 DIAGNOSIS — Z7982 Long term (current) use of aspirin: Secondary | ICD-10-CM | POA: Insufficient documentation

## 2011-04-24 DIAGNOSIS — M545 Low back pain, unspecified: Secondary | ICD-10-CM | POA: Insufficient documentation

## 2011-04-24 DIAGNOSIS — G894 Chronic pain syndrome: Secondary | ICD-10-CM | POA: Insufficient documentation

## 2011-04-24 DIAGNOSIS — E785 Hyperlipidemia, unspecified: Secondary | ICD-10-CM | POA: Insufficient documentation

## 2011-04-24 DIAGNOSIS — M51379 Other intervertebral disc degeneration, lumbosacral region without mention of lumbar back pain or lower extremity pain: Secondary | ICD-10-CM | POA: Insufficient documentation

## 2011-04-24 DIAGNOSIS — M47812 Spondylosis without myelopathy or radiculopathy, cervical region: Secondary | ICD-10-CM | POA: Insufficient documentation

## 2011-04-24 DIAGNOSIS — F3289 Other specified depressive episodes: Secondary | ICD-10-CM | POA: Insufficient documentation

## 2011-04-24 DIAGNOSIS — F329 Major depressive disorder, single episode, unspecified: Secondary | ICD-10-CM | POA: Insufficient documentation

## 2011-04-24 DIAGNOSIS — M159 Polyosteoarthritis, unspecified: Secondary | ICD-10-CM | POA: Insufficient documentation

## 2011-04-24 DIAGNOSIS — I10 Essential (primary) hypertension: Secondary | ICD-10-CM | POA: Insufficient documentation

## 2011-04-24 DIAGNOSIS — K219 Gastro-esophageal reflux disease without esophagitis: Secondary | ICD-10-CM | POA: Insufficient documentation

## 2011-04-24 DIAGNOSIS — H409 Unspecified glaucoma: Secondary | ICD-10-CM | POA: Insufficient documentation

## 2011-04-24 DIAGNOSIS — M5137 Other intervertebral disc degeneration, lumbosacral region: Secondary | ICD-10-CM | POA: Insufficient documentation

## 2011-04-24 DIAGNOSIS — M47817 Spondylosis without myelopathy or radiculopathy, lumbosacral region: Secondary | ICD-10-CM | POA: Insufficient documentation

## 2011-04-24 DIAGNOSIS — Z79899 Other long term (current) drug therapy: Secondary | ICD-10-CM | POA: Insufficient documentation

## 2011-04-24 DIAGNOSIS — Z981 Arthrodesis status: Secondary | ICD-10-CM | POA: Insufficient documentation

## 2011-04-25 NOTE — Assessment & Plan Note (Signed)
[name] is a patient who was seen initially last month by Dr. Riley Kill. This is a gentleman who is referred over by his primary care doctor, Dr. Baltazar Najjar for chronic pain syndrome.  The patient has got a long history of surgical problems.  He has had multiple surgeries by Dr. Channing Mutters. He has also had problems with his lumbar spine and he has had multiple surgeries with Dr. Rennis Chris as far as his orthopedic problems.  The patient could not provide a UDS at his last appointment and again today after close to 30 minutes attempts and multiple drinks, he has not been able to provide a urine specimen.  Review of systems is notable for those difficulties described above, otherwise unremarkable.  Past medical history is unchanged.  Currently his medication list is unchanged. 1. Aspirin 81 mg a day. 2. Wellbutrin 150 SR twice a day. 3. Cymbalta 60 mg a day. 4. Diazepam 5 mg twice a day as needed. 5. OxyContin 40 mg q.6 h. 6. Simvastatin 10 mg a day. 7. Oxycodone 10 mg every 6 hours p.r.n. 8. Tizanidine 4 mg twice daily.  Nothing prescribed through this office except for Mobic that Dr. Riley Kill initiated last visit, the patient states he is taking.  The patient has not been to physical therapy, which was one of the recommendations Dr. Riley Kill made at the last appointment.  The patient states he may return to a doctor in [place] as for pain control.  He will attempt to provide a UDS today again before he leaves.  Physical exam shows blood pressure to be 141/84, pulse 93, respirations 18, O2 sats 92% on room air.  His physical exam has not changed.  He cannot flex to more than about 45 degrees or extend.  Rotation is poor. His strength is 4/5 in the upper and lower extremities at best and sensation seems to be somewhat diminished.  His reflexes are 1 to 1+ throughout his upper and lower extremities.  The patient is very rigid especially in cervical spine status post all surgeries.   He is a poor historian and attempts to mumble quite a bit.  ASSESSMENT: 1. Chronic pain syndrome, multiple issues with degenerative joint     disease as well as cervical surgeries and orthopedic surgeries. 2. History of depression.  PLAN:  We will wait and see if the patient can provide a UDS. 1. If so once his return, Dr. Riley Kill may try some medication options     for the patient at the next appointment in a month if he returns to     this clinic. 2. He is recommended continuous Mobic if it tends to help. 3. Again I recommendation for Physical Therapy, the patient declined. 4. We will follow the patient back in 1 month and Dr. Rosalyn Charters clinic     for counseling and possible medication recommendations.     Lineth Thielke L. Blima Dessert    RLW/MedQ D:  04/24/2011 11:13:52  T:  04/25/2011 00:11:24  Job #:  [id number]

## 2011-05-01 NOTE — Discharge Summary (Signed)
[name], [name]                 ACCOUNT NO.:  [id number]   MEDICAL RECORD NO.:  [id number]          PATIENT TYPE:  INP   LOCATION:  3009                         FACILITY:  [facility]   PHYSICIAN:  Danae Orleans. Venetia Maxon, M.D.  DATE OF BIRTH:  [birth year]   DATE OF ADMISSION:  03/26/2008  DATE OF DISCHARGE:  04/02/2008                               DISCHARGE SUMMARY   REASON FOR ADMISSION:  The patient was admitted through the emergency  room with cervical radiculopathy.   HISTORY OF PRESENT ILLNESS:  He is a 70 year old man who underwent  previous cervical surgery and had cervical spondylotic myelopathy.  The  patient was admitted to the hospital with cervical stenosis and  progressive myelopathy.  He underwent a posterior cervical fusion by Dr.  Channing Mutters from C4 to T1 levels and was better on March 31, 2008, and was  transferred to the Inpatient Rehabilitation Service for postoperative  rehabilitation.   ADMISSION DIAGNOSES:  1. Malfunction of neuro device.  2. Cervical spondylotic myelopathy.  3. Status post prior arthrodesis.  4. Hypertension.  5. Status post knee joint replacement.  6. Depressive disorder.  7. Peripheral neuropathy.  8. Obstructive sleep apnea.  9. History of tobacco use.   FINAL DIAGNOSES:  1. Malfunction of neurodevice.  2. Cervical spondylotic myelopathy.  3. Status post prior arthrodesis.  4. Hypertension.  5. Status post knee and joint replacement.  6. Depressive disorder.  7. Peripheral neuropathy.  8. Obstructive sleep apnea.  9. History of tobacco use.      Danae Orleans. Venetia Maxon, M.D.  Electronically Signed     JDS/MEDQ  D:  05/07/2008  T:  05/08/2008  Job:  [id number]

## 2011-05-01 NOTE — Op Note (Signed)
[name], [name]                 ACCOUNT NO.:  [id number]   MEDICAL RECORD NO.:  [id number]          PATIENT TYPE:  INP   LOCATION:  3307                         FACILITY:  [facility]   PHYSICIAN:  Payton Doughty, M.D.      DATE OF BIRTH:  [birth year]   DATE OF PROCEDURE:  03/30/2008  DATE OF DISCHARGE:                               OPERATIVE REPORT   PREOPERATIVE DIAGNOSIS:  Cervical spondylitic myelopathy at C5-C6 and C6-  C7.   POSTOPERATIVE DIAGNOSIS:  Cervical spondylitic myelopathy at C5-C6 and  C6-C7.   PROCEDURE:  C4-T1 posterior cervical fusion and C6-C7 laminectomy.   ANESTHESIA:  General endotracheal, prepped and draped with alcohol wipe.   COMPLICATIONS:  None.   NURSES:  [place].   ASSISTANT:  Stefani Dama, MD   INDICATION FOR PROCEDURE:  This is a 70 year old gentleman who is fused  from the occiput to C4.  He is also fused at C6-C7.  He developed  cervical myelopathy and evidence of loosening at C6-C7, as well as edema  in his cord at C5-C6.  Taken to the operating room, smoothly  anesthetized, intubated, placed supine in the Stryker bed and then  turned prone in the Stryker bed in the Gardner-Wells traction 5 pounds.  Following shave, prep, and drape in usual sterile fashion, skin was  incised from C2 down to T1 and the old construct extending down to C4  was exposed as well as the C6-C7 construct and the fusion that extended  on to C5.  The rod between C3 and C4 was cut and the C4 screws removed  and replaced with Oasis screws.  This Oasis screws at C6-C7 were also  replaced with 4 mm x 12 mm screws and a T1 pedicle screw was placed 3.5  mm x 12 mm.  These were then connected with a rod on each side.  The C5  screws were not placed because the landmarks from C5 were so indistinct  from the bone graft that I did not feel safe to try and place a screw  there.  Once the rod was connected and final tightener applied, total  laminectomy of C6 and C7 was carried  out with undercutting of C5 to  relieve the pressure on that area that had been identified on the MRI.  The lateral masses were then decorticated with a high-speed drill and  packed with BMP on the extender matrix.  This was done bilaterally.  Intraoperative films showed only the top of the construct because of the  patient's body habitus.  Successive layers of 0 Vicryl, 2-0 Vicryl, and  3-0 nylon were used to close.  Betadine and Telfa dressing was applied,  made occlusive with OpSite and the patient returned to the recovery room  in good condition.           ______________________________  Payton Doughty, M.D.     MWR/MEDQ  D:  03/30/2008  T:  03/31/2008  Job:  [id number]

## 2011-05-01 NOTE — H&P (Signed)
[name], [name]                 ACCOUNT NO.:  [id number]   MEDICAL RECORD NO.:  [id number]          PATIENT TYPE:  INP   LOCATION:  NA                           FACILITY:  [facility]   PHYSICIAN:  Ollen Gross, M.D.    DATE OF BIRTH:  June 02, 1941   DATE OF ADMISSION:  DATE OF DISCHARGE:  05/09/2007                              HISTORY & PHYSICAL   DATE OF OFFICE VISIT HISTORY AND PHYSICAL:  Apr 29, 2007.   CHIEF COMPLAINT:  Left knee pain.   HISTORY OF PRESENT ILLNESS:  The patient is a 70 year old male who has  been seen by Dr. Lequita Halt for ongoing knee pain and previously underwent  a right total knee back in December of 2007, doing well with this.  He  presents now to have the opposite knee done which has been on  conservative treatment. Patient is subsequently admitted to the hospital  to undergo left total knee arthroplasty.   ALLERGIES:  No known drug allergies.   CURRENT MEDICATIONS:  1. OxyContin.  2. Percocet.  3. Budeprion.  4. Celebrex.  5. Enalapril/hydrochlorothiazide.  6. Simvastatin.  7. Clonazepam.   PAST MEDICAL HISTORY:  1. Arthritis.  2. Carpal tunnel.  3. Hypercholesterolemia.  4. History of cervical fracture.  5. Depression.  6. Chronic pain.  7. Hypertension.   PAST SURGICAL HISTORY:  Right total knee, December, 2007; right shoulder  surgery x2; left shoulder surgery x1; cervical surgery x2; hydrocele  surgery.   FAMILY HISTORY:  Mother deceased at age 5 with cervical cancer.  Father  deceased at age 17 with lung cancer.   SOCIAL HISTORY:  Single, no children. Denies use of tobacco products or  alcohol products. Stopped smoking in 1986.   REVIEW OF SYSTEMS:  GENERAL:  No fevers, chills or night sweats.  NEURO:  No seizures, syncope, paralysis. RESPIRATORY:  No shortness of breath,  productive cough or hemoptysis.  CARDIOVASCULAR:  No chest pain,  orthopnea.  GI:  No nausea, vomiting, diarrhea or constipation.  GU:  No  dysuria, hematuria,  discharge.  MUSCULOSKELETAL:  Left knee.   PHYSICAL EXAMINATION:  VITAL SIGNS:  Pulse 56, respirations 14, blood  pressure 158/74.  GENERAL:  A 70 year old white male, well-nourished, well-developed, in  no acute distress. He is alert and oriented and cooperative. Overweight.  HEENT:  Normocephalic, atraumatic.  Pupils are equal, round and  reactive.  Oropharynx clear.  NECK:  Very little motion. No bruits.  CHEST:  Clear anteriorly and posteriorly. Chest is without rhonchi,  rales or wheezing.  HEART:  Regular rate and rhythm, no murmur, S1and S2.  ABDOMEN:  Soft, nontender, slightly round, bowel sounds present.  RECTAL/BREAST AND GENITALIA:  Not done. Not pertinent to present  illness.  EXTREMITIES:  Left knee - no effusion, marked crepitus is noted. Tender  along medial and lateral.   IMPRESSION:  Osteoarthritis of the left knee.   PLAN:  Patient admitted to [facility] to undergo a left total  knee replacement and arthroplasty. Surgery will be performed by Dr.  Ollen Gross.  Alexzandrew L. Perkins, P.A.C.      Ollen Gross, M.D.  Electronically Signed    ALP/MEDQ  D:  05/08/2007  T:  05/08/2007  Job:  [id number]   cc:   Alanson Puls, M.D.

## 2011-05-01 NOTE — H&P (Signed)
[name], [name]                 ACCOUNT NO.:  [id number]   MEDICAL RECORD NO.:  [id number]          PATIENT TYPE:  INP   LOCATION:  2860                         FACILITY:  [facility]   PHYSICIAN:  Payton Doughty, M.D.      DATE OF BIRTH:  01-28-1941   DATE OF ADMISSION:  08/22/2007  DATE OF DISCHARGE:                              HISTORY & PHYSICAL   ADMISSION DIAGNOSIS:  Spondylosis C6-7.   This is a now 70 year old gentleman who has had a history of C1  fracture.  He has had C1-C2 instability, occiput C1 instability and has  had a fusion from the occiput to C4.  This was done several years ago.  He has had increasing pain in his neck, attempted an anterior  decompression and fusion at C6-7, could not be accessed, and he is now  admitted for posterior cervical decompression, left-sided foraminotomy  at C6-7 and posterior fusion at 6-7.   MEDICAL HISTORY:  Is remarkable for arthritic disease.  He is on  Pravachol, OxyContin, oxycodone, simvastatin.  He has had a history of  substance difficulties in the past.   SOCIAL HISTORY:  Does not smoke or drink and is on disability from the  city.   His HEENT exam is within normal limits.  He has limited range of motion  in his neck owing to his past fusion.  CHEST:  Is clear.  CARDIAC EXAM:  Is regular rate and rhythm.  ABDOMEN:  Is large but nontender.  No hepatosplenomegaly.  EXTREMITIES:  Without clubbing or cyanosis.  GENITOURINARY EXAM:  Is deferred.  Peripheral pulses are good.  NEUROLOGICALLY:  He is awake, alert and oriented.  Cranial nerves are  intact.  Motor exam shows 5/5 strength throughout the upper extremities.  He does have a lot of give-way weakness, some of it related to shoulder  pathology.  Lower extremity strength is full.  Reflexes are absent in  the upper and lower extremities.  Hoffman's is negative.   Studies have been reviewed above and basically has spondylitic change,  chronic disk at 6-7.   CLINICAL  IMPRESSION:  Cervical spondylosis, and the plan is for a  posterior cervical decompression and fusion at C6-7.  The risks and  benefits of this have been discussed with him, and he wishes to proceed.    .           ______________________________  Payton Doughty, M.D.     MWR/MEDQ  D:  08/22/2007  T:  08/22/2007  Job:  [id number]

## 2011-05-01 NOTE — H&P (Signed)
NAMEIZEYAH, DEIKE                 ACCOUNT NO.:  [id number]   MEDICAL RECORD NO.:  [id number]          PATIENT TYPE:  INP   LOCATION:  5511                         FACILITY:  [facility]   PHYSICIAN:  Lucita Ferrara, MD         DATE OF BIRTH:  October 24, 1941   DATE OF ADMISSION:  03/03/2008  DATE OF DISCHARGE:                              HISTORY & PHYSICAL   PRIMARY CARE PHYSICIAN:  Lenon Curt. Chilton Si, M.D.   CHIEF COMPLAINT:  I cannot move my lower extremities.   HISTORY OF PRESENT ILLNESS:  Mr. Schnelle is a 70 year old gentleman with  chronic back pain who presents to [facility] with a  complaint of progressively worsening lower extremity weakness to the  point where he cannot move his lower extremities bilaterally today.  He  does have a past medical history significant for cervical back pain.  He  has undergone numerous orthopedic procedures with the last one being a  C6-7 laminectomy on admission during August 22, 2007.  Currently, he  states that his weakness is not really accompanied by any pain in his  back, rather his lower extremity muscles have progressively getting  weaker.  He denies any urinary incontinence.  He denies any saddle  numbness.  His sensation in lower extremities are otherwise okay.  Of  note, the patient underwent a colonoscopy today and was ordered  routinely by his primary care doctor.  After the colonoscopy, the  patient fell backwards x1 and forwards x1.  The patient did not injure  his head and did not have any loss of consciousness.  Note, that he has  no other focal neurological deficits in his upper extremities.  He also  denies any deterioration in his speech.  He denies fevers, chills,  myalgias.  Otherwise, a 12-point review of systems is negative.   PAST MEDICAL HISTORY:  1. Osteoarthritic disease.  2. History of substance abuse in the past.  3. Carpal tunnel syndrome.  4. Hypercholesteremia.  5. History of cervical fracture.  6.  Depression.  7. Hypertension.  8. History of chronic pain syndrome.   PAST SURGICAL HISTORY:  1. Laminectomy.  2. Right total knee in December 2007.  3. Right shoulder surgery x2.  4. Left shoulder surgery x1.  5. Cervical surgery x2.  6. Hydrocele surgery.   FAMILY HISTORY:  Mother deceased at age 66 due to cervical cancer.  Father deceased at age 31 due to lung cancer.   SOCIAL HISTORY:  Denies drugs alcohol.  Stopped smoking in 1986.   PHYSICAL EXAMINATION:  VITAL SIGNS:  Blood pressure 121/72, pulse 67,  respirations 20, pulse oximetry 95% on room air.  HEENT:  Normocephalic, atraumatic.  Sclerae anicteric.  PERRLA.  Extraocular movements intact.  NECK:  Supple.  No JVD.  CARDIOVASCULAR:  S1 and S2, regular rate and rhythm.  No murmurs, rubs,  clicks.  ABDOMEN:  Soft, nontender, nondistended.  Positive bowel sounds.  LUNGS:  Clear to auscultation bilaterally.  No rhonchi, rales or  wheezes.  EXTREMITIES:  The patient completely cannot move his lower  extremity.  Upper extremity strength 5/5 bilaterally.  NEUROLOGIC:  The patient is alert and oriented x3.  Otherwise,  sensations are completely intact in lower extremities.   LABORATORY DATA AND X-RAY FINDINGS:  The patient had a total CK which  was normal at 178, creatinine 1.0.  His basic metabolic panel is  unremarkable.  BUN at 23, creatinine 1.0.  Hemoglobin 14.6, hematocrit  43.   The patient had a C-spine which showed multiple previous effusions.  No  definite fracture, but incomplete visualization of the cervicothoracic  junction.  CT of concern for fracture.  Figure B showed advanced  degenerative changes, minimal wedging of the L1, question acute, not  present in the MRI of 2008.   ASSESSMENT:  A 70 year old with subacute, but progressively worsening,  lower extremity weakness to the point where the patient could no longer  ambulate or move his lower extremities.  This was incidentally at the  same time that  the patient underwent a colonoscopy and fell.  He did not  have any head trauma.   PLAN:  Rule out neurogenic causes of weakness.  Rule out myasthenia  gravis or other neuropathic conditions.  MRI of the lumbar thoracic  spine to rule out compression fracture.  Aldolase level, TSH, ESR.  Consider neurology consult for further evaluation ie  tenselin testing.  PT/OT consult.  Interventional radiology consult if truly there is a  compression fracture.  DVT prophylaxis.      Lucita Ferrara, MD  Electronically Signed     RR/MEDQ  D:  03/03/2008  T:  03/04/2008  Job:  [id number]

## 2011-05-01 NOTE — Op Note (Signed)
NAMEJACY, [name] NO.:  [id number]   MEDICAL RECORD NO.:  [id number]          PATIENT TYPE:  INP   LOCATION:  3172                         FACILITY:  [facility]   PHYSICIAN:  Payton Doughty, M.D.      DATE OF BIRTH:  08-05-1941   DATE OF PROCEDURE:  08/22/2007  DATE OF DISCHARGE:                               OPERATIVE REPORT   PREOPERATIVE DIAGNOSIS:  Spondylosis at C6-7.   POSTOPERATIVE DIAGNOSIS:  Spondylosis at C6-7.   OPERATIVE PROCEDURE:  C6-7 laminotomy and foraminotomy on the left side  and 6-7 posterior cervical fusion with Oasis instrumentation.   SURGEON:  Payton Doughty, M.D.   SERVICE:  Neurosurgery.   ANESTHESIA:  General endotracheal.   PREPARATION:  Prepped with sterile Betadine prep and scrubbed with  alcohol wipe.   COMPLICATIONS:  None.   NURSE ASSISTANT:  [place].   DOCTOR ASSISTANT:  Cristi Loron, M.D.   BODY OF TEXT:  This is a 70 year old gentleman who has had an occiput-to-  C4 fusion and has degenerative changes at 6-7.  He was taken to the  operating room and smoothly anesthetized and intubated, placed on the  Stryker bed with the Gardner-Wells tongs and flipped prone on the bed  and pressure points padded.  Following shaving, prepped and draped in  the usual sterile fashion.  The skin was incised from the mid T1 to mid  C4 and the lamina of C7, C6, C5 and the hardware at C4 were exposed;  this exposure was necessary because the patient's body habitus  prohibited imaging much below the C4 level.  By marking C4 with the  instrument and counting down, it was feasible to correctly identify C6  and C7.  Once correctly identified, the lamina were completely dissected  in the subperiosteal plane.  The laminotomy and foraminotomy were  carried out on the left side with a high-speed drill.  Lateral mass  screws were then placed in C6 and C7 using the standard landmarks.  No  cutouts were encountered.  The screws were placed,  connected with a rod  and the final tightener completed.  The lamina were decorticated with  the high-speed drill and BMP on the Actifuse extender matrix was placed  over them.  Successive layers of 0 Vicryl, 2-0 Vicryl and 3-0 nylon were  used to close.  Benzoin and Betadine Telfa dressing were applied and  made occlusive with OpSite.  The patient returned to the recovery room  in good condition.           ______________________________  Payton Doughty, M.D.     MWR/MEDQ  D:  08/22/2007  T:  08/22/2007  Job:  [id number]

## 2011-05-01 NOTE — Op Note (Signed)
NAMEJAEGAR, CROFT                 ACCOUNT NO.:  [id number]   MEDICAL RECORD NO.:  [id number]          PATIENT TYPE:  AMB   LOCATION:  DSC                          FACILITY:  [facility]   PHYSICIAN:  Katy Fitch. Sypher, M.D. DATE OF BIRTH:  1941-10-22   DATE OF PROCEDURE:  01/06/2008  DATE OF DISCHARGE:                               OPERATIVE REPORT   PREOPERATIVE DIAGNOSIS:  Chronic entrapment neuropathy, left median  nerve at carpal tunnel.   POSTOPERATIVE DIAGNOSIS:  Chronic entrapment neuropathy, left median  nerve at carpal tunnel.   OPERATION:  Release of left transverse carpal ligament.   OPERATIONS:  Katy Fitch. Sypher, MD   ASSISTANT:  Annye Rusk PA-C   ANESTHESIA:  A 2% lidocaine median nerve block and palmar block  supplemented by IV sedation, supervising anesthesiologist is Dr.  Sampson Goon.   INDICATIONS:  Brown Dunlap is a very complex 70 year old gentleman who  has had profound spinal degenerative disk disease and multiple spinal  procedures as well as a history of bilateral total knee replacement and  prior left carpal tunnel release.  He has a history of significant hand  numbness.  This was recently worked up with contemporary  electrodiagnostic studies revealing profound median neuropathy on the  right and median neuropathy on the left.   Clinical examination revealed signs of a prior left carpal tunnel  release.  However, there was no change in the contour of the volar  wrist, suggesting that he had not had a proximal ligament release.   The electrodiagnostic studies completed preoperatively at [facility] revealed signs of significant left median neuropathy at the  wrist level.   Due to a failure to respond to nonoperative measures, he is brought to  the operating room at this time for re-exploration of his left carpal  canal.   PROCEDURE:  Vic Esco was brought to the operating room and placed in  a supine position on the operating table.   Following sedation with IV Diprivan and fentanyl, the left arm was  prepped with Betadine soap and solution and  sterilely draped.  Lidocaine 2% was infiltrated the path of the intended incision and into  the volar aspect of the wrist surrounding the median nerve.   After 10 minutes, excellent anesthesia was achieved.   The left arm was then exsanguinated with an Esmarch bandage and an  arterial tourniquet inflated to 250 mmHg due to mild systolic  hypertension.   The procedure commenced with a 3-cm incision paralleling the prior  thenar incision.  The subcutaneous tissues were carefully divided,  taking care to identify the palmar fascia.  Virginal palmar fascia  fibers were incised longitudinally, revealing the common sensory branch  of the median nerve.  These were followed back to the transverse carpal  ligament.  There was a fully intact distal margin of the transverse  carpal ligament that was deeply indenting the median nerve and common  sensory branches.   The confines of the carpal canal were palpated with a Penfield #4  elevator, followed by release of the transverse carpal ligament along  its ulnar border extending into the distal forearm.   This widely opened the carpal canal.  The tenosynovium of the ulnar  bursa was noted to be quite edematous and inflammatory.  The median  nerve was under clear compression at the level of the distal margin of  the transverse carpal ligament.  In the volar forearm, the forearm  fascia had been released previously.  There was no sign of forearm-level  compression.   Bleeding points along the margin of the released ligament were  electrocauterized with bipolar current, followed by repair of the skin  with intradermal 3-0 Prolene suture.   A compressive dressing was applied with a volar plaster splint  maintaining the wrist in 15 degrees dorsiflexion.   There were no apparent complications.      Katy Fitch Sypher, M.D.   Electronically Signed     RVS/MEDQ  D:  01/06/2008  T:  01/06/2008  Job:  [id number]   cc:   Frederica Kuster, MD  Ollen Gross, M.D.

## 2011-05-01 NOTE — Discharge Summary (Signed)
[name], [name]                 ACCOUNT NO.:  [id number]   MEDICAL RECORD NO.:  [id number]          PATIENT TYPE:  IPS   LOCATION:  4009                         FACILITY:  [facility]   PHYSICIAN:  Ranelle Oyster, M.D.DATE OF BIRTH:  Feb 21, 1941   DATE OF ADMISSION:  04/02/2008  DATE OF DISCHARGE:  04/28/2008                               DISCHARGE SUMMARY   DISCHARGE DIAGNOSES:  1. Cervical myelopathy secondary to cord edema and loosening of prior      C6-C7 instrumentation status post C4 through T1 posterior fusion.  2. Depression.  3. Chronic constipation better with bowel program.  4. Urinary retention resolved.  5. Mild hyponatremia resolved.  6. Postop fluid collection.   HISTORY OF PRESENT ILLNESS:  [name] is a 70 year old male with  history of hypertension and multiple cervical surgeries in the past,  last was September 2008, noted to have progressive weakness, last fall  on March 18.  Workup revealed cervical myelopathy with evidence of  loosening of prior fusion C6-C7 with cord edema at C5-C6.  The patient  admitted April 10 and underwent C4-T1 posterior cervical fusion with C6  to C7 lam by Dr. Channing Mutters on April 14.  He has continued with right lower  extremity weakness greater than left lower extremity and bilateral hand  weakness.  Therapies are ongoing and the patient is at 40-50% for  transfers.  Requires total assist for toileting.  Able to sit at the  edge of bed supervision to min assist.  Rehab consult for further  therapies.   PAST MEDICAL HISTORY:  Significant for hypertension, history of C1  fracture with occipital neuralgia and nerve decompression and hydrocele  surgery, right rotator cuff repair, postop I and D secondary to  infection, left rotator cuff repair, left carpal tunnel release, chronic  pain, bilateral total knee replacements, cervical decompression in June  2008 and September 2008 and depression.   ALLERGIES:  No known drug allergies.   FAMILY HISTORY:  Is positive for cancer.   SOCIAL HISTORY:  The patient lives alone in one level home with four  steps at entry.  Friends and family were checking in and assisting for  the past 3-4 weeks prior to admission.  The patient does not use any  tobacco or alcohol.  Used a walker but was noted to have decrease in  mobility a few weeks prior to admission.   HOSPITAL COURSE:  [name] was admitted to rehabilitation on  April 02, 2008, for inpatient therapies to consist of PT, OT daily.  At  time of admission the patient reported issues with constipation.  Initially he required sorbitol as well as soapsuds enema to help with  his severe constipation issues.  Currently bowel program is managed with  use of Senokot-S two p.o. b.i.d. as well as MiraLax 17 grams p.o. daily  at bedtime.  Additionally, he requires dig-stim with enema in the early  evenings to help with complete evacuation of bowels.  The patient was  also noted to have problems voiding past discharge.  Bladder scans  revealed volumes at  400-600 mL.  Secondary to poor mobility and high  bladder volumes a Foley was placed to help decompress bladder.  As the  patient's bowel program improved with daily bowel movement Foley was  discontinued.  PVRs were checked and the patient was noted to be voiding  without any signs of retention.  Last check of CBC shows leukocytosis to  be resolving with hemoglobin 11.9, hematocrit 35.3, white count 11.9,  platelets 346.  Check of lytes were done during this stay.  The patient  was noted to have some downward trend in his sodium on April 29 to 130.  The patient had been started on Septra DS for Citrobacter UTI and  question this as cause of his hyponatremia.  Recheck lytes have been  done during this stay and sodium hyponatremia has resolved.  Last check  of lytes of May 1 revealed sodium 138, potassium 4.5, chloride 100, CO2  34, BUN 13, creatinine 0.83, glucose 108.  Repeat  urine cultures done on  April 30 past Foley discontinuation has shown no growth.  Repeat UA UC  on May 6 does show 15,000 colonies multi species.  The patient's blood  pressures have been checked on b.i.d. basis during this stay.  These  have ranged from 110-130s systolic, 60s-80s diastolic.   The patient was noted to have some decrease in energy level, decreased  endurance with problems in some cord strength reported by physical  therapy on April 29.  CT of neck was done that showed moderately large  postoperative fluid collection at C5, C6, C7 with cross section  measurement of 78 x 94 x 125 mm.  Neurosurgery was consulted for input  on this.  They felt the patient's fluid collection internally, no  surgical interventions recommended.  No new recommendations.  The  patient's incision is noted to be healing well.  Initially was noted to  have some serous drainage.  This has resolved.  Sutures were  discontinued on April 30 without difficulty.  The patient has had issues  with pain control.  Pain management has much improved with adjustments  of meds to help with pain control and also to prevent any sedatives  effects of this.  He has had issues with bilateral shoulder pain  secondary to prior rotator cuff issues.  Left shoulder was injected on  May 11 to help with his symptomatology.  The patient's mood, motivation  has improved.  He has been participating along in therapies currently  and overall endurance levels have improved.  Currently the patient is at  min assist for sliding board transfers with min cues to help set up.  He  requires steady assist with max assist for lower body care, requires  some assist for hygiene with toileting.  Most recent PT notes indicate  the patient able to transfer supine to feeding with supervision setup  did with wheelchair and sliding board at min assist with extra time.  Total assist +2 for lower extremity advancement and facilitation of hip  and  knee flexion and truncal rotation.  These had improved in weight  shifting and advancement of right lower extremity.  PT has also been  working with the patient with active and active assisted extremity  strengthening and ROM exercises both supine on mat.  Currently secondary  to amount of assistance required the family has elected on SNF.  Bed is  available at [facility] and the patient is to be discharged to  this facility on May  13 with progressive PT, OT to continue past  discharge.   DISCHARGE MEDICATIONS:  1. Pepcid 20 mg p.o. b.i.d.  2. Wellbutrin SR 150 mg b.i.d.  3. Klonopin 1 mg p.o. b.i.d.  4. Flomax 0.4 mg p.o. daily at bedtime.  5. Celebrex 200 mg p.o. per day.  6. Vitamin E 800 units p.o. per day.  7. OxyContin CR 40 mg p.o. q.8 h.  8. MiraLax 17 grams in 8 ounces of fluid p.o. daily at bedtime.  9. Senokot-S two p.o. b.i.d.  10.Selsun Blue shampoo applied to red flaky areas on face, scalp      daily.  11.Urecholine 25 mg p.o. t.i.d.  12.Mini enema one per rectum q. 4 p.m.  13.Maalox Plus 30 mL p.o. q.4 h. p.r.n.  14.Trazodone 25-50 mg p.o. daily at bedtime.  15.Ultram 50 mg p.o. q.6 h. p.r.n. pain.  16.Oxycodone 5-10 mg p.o. q. 4 h. p.r.n. pain.  17.Flexeril 5 mg p.o. q.8 h. p.r.n. muscle aches, spasms.  18.BenGay analgesic balm to shoulders p.r.n.   DIET:  Is regular.   ACTIVITY:  Level is with supervision assistance at wheelchair level.  Routine pressure relief measures.   SPECIAL INSTRUCTIONS:  Toilet patient routinely and progressive PT, OT  to continue past discharge.  Continue pressure relief measures.  Cervical collar on at all times.  Routine monitoring of stage 2 pressure  area left gluteal area with change of hydrocolloid 3 to 4 days.  Encourage the patient to boost q. 30 minutes in a chair.   FOLLOWUP:  The patient will follow up with Dr. Channing Mutters for postop check.  Follow up with Dr. Dellis Filbert or LMD at [facility] for routine check.  Follow up with  Dr.  Riley Kill in 4-6 weeks.      Greg Cutter, P.A.      Ranelle Oyster, M.D.  Electronically Signed    PP/MEDQ  D:  04/27/2008  T:  04/28/2008  Job:  [id number]   cc:   Payton Doughty, M.D.  Love, Dr  Hyacinth Meeker, Dr

## 2011-05-01 NOTE — Discharge Summary (Signed)
NAMEZARION, OLIFF NO.:  [id number]   MEDICAL RECORD NO.:  [id number]          PATIENT TYPE:  IPS   LOCATION:  4009                         FACILITY:  [facility]   PHYSICIAN:  Ellwood Dense, M.D.   DATE OF BIRTH:  1941/03/25   DATE OF ADMISSION:  04/02/2008  DATE OF DISCHARGE:                               DISCHARGE SUMMARY   PRIMARY CARE PHYSICIAN:  Dr. Hyacinth Meeker.   NEUROSURGEON:  Dr. Channing Mutters.   NEUROLOGIST:  Dr. Sandria Manly and Dr. Sharene Skeans.   HISTORY OF PRESENT ILLNESS:  Mr. Nephew is a 70 year old Caucasian male  with history of hypertension and multiple cervical surgeries.  His last  surgery was September 2008, with development of progressive weakness and  a fall March 03, 2008.  Workup revealed cervical myelopathy with  evidence of loosening of prior fusion at C6-C7 with cord edema at C5-C6.   The patient was admitted March 26, 2008, and underwent C4-T1 posterior  cervical fusion with C6-C7 laminectomy performed by Dr. Channing Mutters, March 30, 2008.  Postoperatively, he continues with right lower extremity weakness  and bilateral hand weakness.  Therapy has been ongoing.  The patient  requires +2 total to transfer doing 40% to 50% and +2 total for  toileting with occupational therapy.  He is able to sit at the edge of  the bed with supervision to min assist level for 20 minutes.   The patient was evaluated by the rehabilitation physicians and felt to  be an appropriate candidate for inpatient rehabilitation.   REVIEW OF SYSTEMS:  Positive for numbness, weakness, depression, and  wound healing.   PAST MEDICAL HISTORY:  1. Hypertension.  2. History of C1 fracture with occipital neuralgia and nerve      decompression in 2005.  3. Hydrocele surgery.  4. Right rotator cuff repair with postoperative I&D.  5. Left rotator cuff repair.  6. Left carpal tunnel release.  7. Chronic pain.  8. Bilateral total knee replacement.  9. Cervical decompression in 2006, February 2008, and  September 2008.  10.Depression.   FAMILY HISTORY:  Positive for cancer.   SOCIAL HISTORY:  The patient lives alone in 1-level home with 4 steps to  enter.  He does not use alcohol or tobacco.  He has family and church  friends who can check on him at discharge, and have been doing so prior  to this admission.   FUNCTIONAL HISTORY PRIOR TO ADMISSION:  Independent with a walker but  with decreased ambulation the past few weeks.   ALLERGIES:  NO KNOWN DRUG ALLERGIES.   MEDICATIONS PRIOR TO ADMISSION:  1. Flexeril 10 mg t.i.d.  2. Celebrex 200 mg.  3. Bupropion sustained release 150 mg b.i.d.  4. Flomax 0.4 mg daily.  5. OxyContin continuous release 40 mg q.8 h.  6. Klonopin 1 mg b.i.d.  7. Oxycodone 10/650 one tablet q.4 h p.r.n.   LABORATORY DATA:  Recent hemoglobin was 13.3 with hematocrit of 39.5,  platelet count 310,000, white count of 8.9.  Recent sodium is 133,  potassium 4.0, chloride 95, bicarbonate 29, BUN 18, and  creatinine 0.72,  with glucose of 90.  Recent urine culture grew strep viridans.  CT of  the abdomen and pelvis showed small, nonobstructive, bilateral renal  calculi with urinary bladder unremarkable and feces throughout the  colon.   PHYSICAL EXAMINATION:  GENERAL:  Reasonably well-appearing bearded adult  male seated up in a chair, finishing lunch.  He has a hard cervical  collar in place.  VITAL SIGNS:  Blood pressure is 124/70 with pulse of 57, respirations  18, and temperature 97.7.  HEENT:  Well-healing wound with slight drainage in the posterior  cervical region.  CARDIOVASCULAR:  Regular rate and rhythm, S1, S2 without murmurs.  ABDOMEN:  Soft, obese, and nontender with positive bowel sounds.  LUNGS:  Clear to auscultation bilaterally.  NEUROLOGIC:  Alert and oriented x3.  Cranial nerves II through XII were  grossly intact.  EXTREMITIES:  Bilateral upper extremity exam showed 3+ to 4-/5 strength  proximally in the bilateral upper extremities and  3-/5 strength in the  distal bilateral upper extremities.  Sensation was decreased to light  touch.  Strength in the left lower extremity was 4-/5 and the right  lower extremity was 3+/5 with decreased sensation throughout the  bilateral lower extremities.   DIAGNOSES:  1. Cervical myelopathy with recent loosening of prior C6-C7 fusion,      status post repeat C4-T1 posterior fusion and C6-C7 laminectomy.  2. Persistent weakness of the bilateral hands and right leg.   Presently, the patient has deficits in ADLs, transfers, and ambulation  related to the above-noted posterior cervical fusion and cervical  myelopathy.   PLAN:  1. Admit to the rehabilitation unit for daily therapies to include      physical therapy for range of motion, strengthening, bed mobility,      transfers, pre-gait training, gait training,  and equipment      evaluation.  2. Occupational therapy for range of motion, strengthening, ADLs,      cognitive/perceptual training, splinting, and equipment evaluation.  3. Rehab nursing for skin care, wound care, and bowel and bladder      training as necessary.  4. Case management to assess home environment, assist with discharge      planning, and arrange for appropriate follow-up care.  5. Social work to assist family and social support and assist in      discharge planning.  6. Continue regular diet.  7. Check admission labs including CBC and CMET in a.m. April 05, 2008.  8. Oxycodone 5 mg 1-2 tablets p.o. q.4 h p.r.n. for pain.  9. Wellbutrin sustained release 150 mg p.o. b.i.d.  10.Celebrex 200 mg p.o. daily.  11.Senokot-S 2 tablets p.o. nightly.  12.Dulcolax suppository one per rectum q.48 h, if no bowel movement by      this evening.  13.Flexeril 10 mg p.o. t.i.d.  14.Pepcid 20 mg p.o. b.i.d.  15.Flomax 0.4 mg p.o. nightly.  16.Sorbitol 30 mL p.o. nightly p.r.n. for no bowel movements.  17.Decadron 6 mg p.o. q.i.d.  18.Grounds pass with staff and family  p.r.n.  19.Klonopin 1 mg p.o. b.i.d.  20.OxyContin sustained release 40 mg p.o. q.8 h.  21.Vitamin E 800 units p.o. daily.  22.Discontinue IV fluids and normal saline lock site.  23.Continue Foley to straight drainage.  24.Soapsuds enema this p.m. after admission.  25.Cleanse neck wound with normal saline and apply dry dressing daily.  26.Hard cervical collar at all times, and change pads and wash pads      p.r.n.  PROGNOSIS:  Good.   ESTIMATED LENGTH OF STAY:  15-20 days.   GOALS:  Modified independent to standby assist transfers, ADLs,  ambulation with a modified independent longer distance mobility using a  wheelchair.           ______________________________  Ellwood Dense, M.D.     DC/MEDQ  D:  04/02/2008  T:  04/03/2008  Job:  [id number]

## 2011-05-01 NOTE — Consult Note (Signed)
[name], NEDVED NO.:  [id number]   MEDICAL RECORD NO.:  [id number]          PATIENT TYPE:  INP   LOCATION:  5511                         FACILITY:  [facility]   PHYSICIAN:  Payton Doughty, M.D.      DATE OF BIRTH:  29-Sep-1941   DATE OF CONSULTATION:  03/04/2008  DATE OF DISCHARGE:                                 CONSULTATION   This is a 70 year old right-handed white gentleman that I have known for  a number of years.  He had C1 fracture.  He has had an occipital nerve  decompression.  He has had a occiput to C4 fusion posteriorly.  He has  had an anterior decompression fusion at C6-7 and then he has also had a  posterior cervical fusion C6-7 with decompression.  He has had  complaints of lower extremity weakness for some months.  Apparently fell  yesterday after colonoscopy and complained of being unable to move his  legs.  Apparently by the time the neurologist visited with him, his legs  were moving.  MR of the cervical spine showed some spondylosis.  CT of  the cervical spine showed questionable loosening at C6-7 with some  stenosis of his cervical canal.  On the MR, there is a question of some  edema in the cord.   PHYSICAL EXAMINATION:  He is awake, alert and oriented.  Cranial nerves  are intact.  NEUROLOGIC:  Motor exam showed 5/5 strength throughout the upper  extremities.  In the lower extremities, he is about 3+/5 in the hip  flexors on the right.  The left are 4/5.  Knee extensions on the right  are 4+/5, left are 4+/5.  The remainder of his lower extremity strength  is full.  Appropriate reception is intact.  Reflexes were 1 at the  knees, 1 at the ankles.  Toes are indifferent.  His exam is about the  same as it was previously; it is not consistent with a C6-7 myelopathy  as his hands are unaffected.   CT showing screws, seems a bit worrisome but there is no fracture  evident.  I believe the exam is most consistent with lower extremity,  lower  motor neuron lesion.  Recommend an MR of the L-spine.  If the MR  of the L-spine is negative,  can consider posterior cervical  decompression and extension of his fusion up to C5 and down to T1 to  bridge there with a questionable loosening but as his exam is stable, I  doubt if it would help him very much.  I think if he did incur some  edema in the cord from his fall or cord contusion, that time is what is  going to be the best for it.  We will see what the L-spine MR looks  like.           ______________________________  Payton Doughty, M.D.     MWR/MEDQ  D:  03/04/2008  T:  03/04/2008  Job:  [id number]

## 2011-05-01 NOTE — Discharge Summary (Signed)
NAMEESTON, [name]                 ACCOUNT NO.:  [id number]   MEDICAL RECORD NO.:  [id number]          PATIENT TYPE:  INP   LOCATION:  3021                         FACILITY:  [facility]   PHYSICIAN:  Payton Doughty, M.D.      DATE OF BIRTH:  08-May-1941   DATE OF ADMISSION:  08/22/2007  DATE OF DISCHARGE:  08/27/2007                               DISCHARGE SUMMARY   ADMITTING DIAGNOSIS:  Spondylosis at C6-7.   DISCHARGE DIAGNOSIS:  Spondylosis at C6-7.   PROCEDURE:  C6-7 laminectomy and posterior cervical fusion.   SERVICE:  Neurosurgery.   COMPLICATION:  None.   DISCHARGE STATUS:  Alive and well.   BODY OF TEXT:  A 70 year old gentleman whose history and physical is  recounted on the chart.  Had cervical spondylosis at C6-7 that could not  be approached anteriorly.  Her was taken to the operating room.  General  exam is intact.  Neurologic exam showed cervical myelopathy.   He was taken to the operating room after ascertainment of normal  laboratory values and underwent a posterior cervical fusion at C6-7.  Postoperatively he has done well.  He did his usual squawking about  inadequate pain medication but participated readily in physical therapy.  He is now back on his oral OxyContin and Percocet.  His incision is dry  and well-healing.  His strength is full.  His vital signs are stable.   Dictation ended at this point.           ______________________________  Payton Doughty, M.D.     MWR/MEDQ  D:  08/27/2007  T:  08/27/2007  Job:  [id number]

## 2011-05-01 NOTE — Discharge Summary (Signed)
[name], [name]                 ACCOUNT NO.:  [id number]   MEDICAL RECORD NO.:  [id number]          PATIENT TYPE:  INP   LOCATION:  3033                         FACILITY:  [facility]   PHYSICIAN:  Lonia Blood, M.D.       DATE OF BIRTH:  1941-04-22   DATE OF ADMISSION:  03/04/2008  DATE OF DISCHARGE:  03/08/2008                               DISCHARGE SUMMARY   PRIMARY CARE PHYSICIAN:  Bertram Millard. Hyacinth Meeker, M.D.   PRIMARY NEUROSURGEON:  Payton Doughty, M.D.   DISCHARGE DIAGNOSES:  1. Cervical myelopathy.  2. Peripheral neuropathy.  3. Bilateral lower extremity weakness.  4. Lumbar spine spondylosis.  5. Chronic pain syndrome.  6. Constipation, resolved.  7. Obesity.  8. Hypokalemia and dehydration, resolved.  9. Depression.  10.Status post fall.   DISCHARGE MEDICATIONS:  1. OxyContin 40 mg by mouth every 8 hours.  2. Celebrex 200 mg daily.  3. Wellbutrin sustained release 150 mg daily.  4. Percocet 10/650 one tablet by mouth every 4 hours as needed for      pain.  5. Lorazepam 1 mg 2 times a day.  6. Flomax 0.4 mg at bedtime.  7. Aspirin 81 mg daily.  8. Senokot 2 tablets at bedtime.  9. Flexeril 10 mg 3 times a day.   CONDITION ON DISCHARGE:  [name] is discharged in fair condition.  At  the time of discharge, his physical examination indicates bilateral  lower extremity weakness more in the proximal muscle rather than the  distal muscles with slight asymmetry with the right side being weaker  than the left side.  The patient is able to ambulate with the aid of a  walker.  There is no urinary or fecal incontinence.  [name] is to be  scheduled for a C4-T1 posterior decompression surgery by Dr. Channing Mutters within  a couple of weeks.   PROCEDURES DURING THIS ADMISSION:  1. On March 03, 2008, x-ray of the lumbar, thoracic, and cervical      spine, findings of no definite acute fractures, minimal wedging of      the L1 vertebra and some spondylosis of the lumbar spine without  any visible thoracic spine abnormalities.  2. On March 04, 2008, computed tomography scan of the cervical spine      with findings of a solid posterior fusion from C1-C4, anterior and      posterior fusion of C6-C7 with some loosening of the hardware at C6-      C7 without fracture.  3. On March 04, 2008, MRI of the cervical spine, findings of moderate      spinal stenosis, compression of the cord, cord edema at the C6-C7      region.  4. On March 05, 2008, MRI of the lumbar spine, findings of spondylosis      L3-L4, L4-L5, L5-S1 and some minimal L5 disk bulge bilaterally      without clear disk herniation.   CONSULTATION DURING THIS ADMISSION:  The patient was seen in  consultation by Dr. Sharene Skeans, Dr. Sandria Manly, and Dr. Trey Sailors.  For  history  and physical, refer to dictated admission history and physical  that was  done on March 03, 2008 by Dr. Flonnie Overman.   HOSPITAL COURSE:  1. Bilateral lower extremity weakness.  [name] was admitted on      March 03, 2008, after a fall and worsening lower extremity      weakness.  His history and exam indicative of acute on chronic      paraparesis.  There were never any signs of rectal tone problems or      urinary incontinence.  [name] was placed in an Acute Care Unit      of [facility] and he had a full evaluation for possibility      of myopathy as well as possibility of cord compression.  The      patient's MRI of the cervical spine raised the possibility of a      moderate C-spine cord compression at C6-C7 level.  [name] was      seen multiple times by Dr. Trey Sailors from neurosurgery and Dr. Avie Echevaria from neurology.  Throughout this hospitalization, the      patient's weakness did improve some.  For this reason, he is      currently considered for a posterior fusion from C4-T1 in 2 weeks.      He is deemed safe to discharge home with home health physical      therapy and occupational therapy.  2. Dehydration secondary to  colon preparation product and massive      diarrhea.  This problem has resolved with intravenous fluids and      holding the hydrochlorothiazide and enalapril.  3. Chronic pain syndrome.  [name] has been treated with 40 mg of      OxyContin every 8 hours with breakthrough oxycodone for pain.      Lonia Blood, M.D.  Electronically Signed     SL/MEDQ  D:  03/08/2008  T:  03/09/2008  Job:  [id number]   cc:   Payton Doughty, M.D.  Genene Churn. Love, M.D.  Bertram Millard. Hyacinth Meeker, M.D.

## 2011-05-01 NOTE — Op Note (Signed)
[name], [name]                 ACCOUNT NO.:  [id number]   MEDICAL RECORD NO.:  [id number]          PATIENT TYPE:  INP   LOCATION:  0002                         FACILITY:  [facility]   PHYSICIAN:  Ollen Gross, M.D.    DATE OF BIRTH:  [birth year]   DATE OF PROCEDURE:  05/09/2007  DATE OF DISCHARGE:                               OPERATIVE REPORT   PREOPERATIVE DIAGNOSIS:  Osteoarthritis of the left knee.   POSTOPERATIVE DIAGNOSIS:  Osteoarthritis of the left knee.   PROCEDURE:  Left total knee arthroplasty.   SURGEON:  Ollen Gross, M.D.   ASSISTANT:  Avel Peace PA-C   ANESTHESIA:  Spinal.   ESTIMATED BLOOD LOSS:  Minimal.   DRAINS:  None.   TOURNIQUET TIME:  38 minutes at 300 mmHg.   COMPLICATIONS:  None.   CONDITION:  Stable to recovery.   BRIEF CLINICAL NOTE:  [name] is a 70 year old male with severe end-stage  osteoarthritis of the left knee, progressively worsening pain and  dysfunction.  He has had successful right total knee arthroplasty and  presents now for left total knee arthroplasty.   PROCEDURE IN DETAIL:  After successful administration of spinal  anesthetic, a tourniquet is placed high on the left thigh and left lower  extremity prepped and draped in the usual sterile fashion.  Extremity is  wrapped in an Esmarch, knee flexed, tourniquet inflated to 300 mmHg.  Midline incision is made with a 10 blade through subcutaneous tissue to  the level of the extensor mechanism.  Fresh blade is used to make a  medial parapatellar arthrotomy.  Soft tissue over the proximal medial  tibia is subperiosteally elevated to the joint line with the knife and  into the semimembranosus bursa with a Cobb elevator.  Soft tissue  laterally is elevated with attention being paid to avoid the patellar  tendon on the tibial tubercle.  Patella subluxed laterally, knee flexed  90 degrees, and ACL and PCL removed.  Drill is used to create a starting  hole in the distal femur and canal  was thoroughly irrigated.  A 5-degree  left valgus alignment guide is placed, and referencing off the posterior  condyles rotation is marked and the block pinned to remove 10 mm off the  distal femur.  Distal femoral resection is made with an oscillating saw.  Sizing block is placed; size 5 is most appropriate.  Rotation is marked  off the epicondylar axis and the size-5 cutting block placed.  The  anterior, posterior and chamfer cuts are made.   Tibia subluxed forward and the menisci are removed.  Extramedullary  tibial alignment guide is placed, referencing proximally at the medial  aspect of the tibial tubercle and distally along the second metatarsal  axis and tibial crest.  Block is pinned to remove 10 mm of the  nondeficient lateral side which got down to the base of the tibial  defect medially.  Tibial resection is made with an oscillating saw.  Size 4 is the most appropriate tibial component, and the tibia is  prepared with modular drill and keel punch for  a size 4.  Femoral  preparation is completed with the intercondylar cut for a size 5.   A size-4 mobile bearing tibial trial, a size-5 posterior-stabilized  femoral trial, and a 10-mm posterior-stabilized rotating platform insert  trials were placed.  With the 10, full extension is achieved with  excellent varus and valgus balance throughout full range of motion.  The  patella is then everted, thickness measured to 25 mm.  Freehand  resection is taken to 15 mm, a 38 template is placed, lug holes are  drilled, trial patella is placed and it tracks normally.  Osteophytes  are removed off the posterior femur with the trial in place.  All trials  are removed, and then the cut bone surfaces are prepared with pulsatile  lavage.  Cement is mixed and once ready for implantation the size-4  mobile bearing tibial tray, the size-5 posterior-stabilized femur and 38  patella are cemented into place.  The patella is held with a clamp.   Trial 10-mm insert is placed, knee held in full extension, and all  extruded cement removed.  Once the cement is fully hardened, the  permanent 10-mm posterior-stabilized rotating platform insert is placed  into the tibial tray.  Prior to this, we had injected FloSeal onto the  posterior capsule.  FloSeal was injected in medial and lateral gutters  and suprapatellar area.  Tourniquet was released with a total time of 38  minutes.  Pressure is held with a moist sponge and then the sponge  removed and there is minimal bleeding.  Cautery was used to stop any  bleeding identified.  The wound is copiously irrigated with saline  solution and the extensor mechanism closed with interrupted #1 PDS.  Flexion against gravity is 135 degrees.  Subcutaneous is closed with  interrupted 2-0 Vicryl and subcuticular running 4-0 Monocryl.  The  incision is cleaned and dried and Steri-Strips and a bulky sterile  dressing applied.  The patient is placed into a knee immobilizer,  awakened, and transported to recovery in stable condition.      Ollen Gross, M.D.  Electronically Signed     FA/MEDQ  D:  05/09/2007  T:  05/09/2007  Job:  [id number]

## 2011-05-01 NOTE — Consult Note (Signed)
[name], Travis                 ACCOUNT NO.:  [id number]   MEDICAL RECORD NO.:  [id number]          PATIENT TYPE:  INP   LOCATION:  5511                         FACILITY:  [facility]   PHYSICIAN:  [name], Brett TravisDATE OF BIRTH:  1941-08-10   DATE OF CONSULTATION:  03/04/2008  DATE OF DISCHARGE:                                 CONSULTATION   CHIEF COMPLAINT:  I cannot move my lower extremities.Marland Kitchen   HISTORY OF PRESENT CONDITION:  [name] is a 70 year old gentleman  with severe cervical spondylosis and upper cervical fractures from an  industrial accident.   The patient had a routine colonoscopy for constipation (he takes regular  heavy doses of narcotics for pain) which turned out to be normal.   He had episodes of falling while at [facility] for  his procedure, mildly injuring himself.  He made it on home and after he  took a brief nap, he was unable to move his legs.  He was able to crawl  to the phone and called 9-1-1 and was brought to [facility] [employer] where he was evaluated and noted to have significant weakness  in his legs, right greater than left.   The patient has been walking with a walker, was very clumsy.  His  sister, who is at bedside, says that it is obvious when he  gets up on  his legs that the legs tend to splay apart and then his legs will give  way and he will fall.  He has fallen on a few occasions when she has  been with him and she has had to call 9-1-1 when she could not get him  up herself.  This seems to be a progressive process that dates back 3-4  months.   The patient was seen for this similar problem January 16, 2008, in our  office.  Dr. Sandria Manly assessed the patient and he found that the patient had  some atrophy in the intrinsic muscles of his right hand.  In his lower  extremities, he had weakness greater in the left iliopsoas, tibialis  anterior and foot inverters.  The patient had decreased vibration  sense  in the right leg as compared with the left, poor two-point  discrimination in both of his hands, decreased position sense in his  right foot, absent reflexes in his left arm with normal reflexes in his  right arm normal, present patellar reflex on the right but absent on the  left, no ankle reflexes.   The patient appeared to have cervical myelopathy by examination.  MRI  showed enhancement of the spinal cord at C5-6 and C6-7.  He also had  lower extremity paresthesias.  Dr. Sandria Manly was concerned about myelopathy  which was unlikely to be treatable and also was worried about peripheral  neuropathy.   The patient had evidence of mild sensory polyneuropathy and nerve  conduction study December 18, 2005.  He had extensive workup looking for  vitamin B12 deficiency, elevated methylmalonic acid, for vasculitis,  diabetes, hypothyroidism, all of his workup was  negative.  In addition,  he had evaluation for heavy metal intoxication and for autoimmune  disorders to myelin.  Edg-1, GM-1 (IgG and IgM) and anti-MAG antibodies.   PAST MEDICAL HISTORY:  Remarkable for  1. Obesity.  2. Negative for incontinence.  3. Positive for constipation.  4. Positive for prostatism.  5. The patient is in chronic pain in his neck and his back.   REVIEW OF SYSTEMS:  His 12-system review is remarkable for pain in his  lower neck that has been present for about a month, it is not new, and  some pain in his back and abdomen where he fell forward and backwards  yesterday.   The patient has also had carpal tunnel release, has dyslipidemia, had a  cervical fracture as a result of the accident at work, has depression,  hypertension.   CURRENT MEDICATIONS:  1. Aspirin 81 mg daily.  2. Enalapril/hydrochlorothiazide 10/25 one daily.  3. Percocet 10/650 one to two tablets every 8 hours.  4. Zocor 10 mg every 8 hours.  5. Clonazepam 1 mg 1 tablet twice daily.  6. Flomax 0.4 mg after lunch.  7. Lactulose 10  grams 15 mL, 30 mL at nighttime.  8. OxyContin 40 mg twice daily.  9. There is history that he is taking Wellbutrin but I do not see that      as being ordered.   PAST SURGICAL HISTORY:  1. Cervical laminectomy and fusion.  2. Bilateral total knee replacements.  3. Right shoulder surgery x2.  4. Left shoulder surgery x1.  5. Carpal tunnel release.  6. Hydrocele surgery.   FAMILY HISTORY:  Mother died at age 77 due to cervical cancer.  Father  died at age 22 due to lung cancer.   SOCIAL HISTORY:  The patient stopped smoking in 1986.  He does not drink  alcohol nor use recreational drugs.   ALLERGIES:  He has no known allergies to medicines.   PHYSICAL EXAMINATION:  GENERAL APPEARANCE:  Pleasant gentleman in no  acute distress.  VITAL SIGNS:  Temperature 97.8, resting pulse 70, respirations 24, blood  pressure 141/77, oxygen saturation 99%.  HEENT:  He has decreased range of motion to the neck.  He has tenderness  in his back region to any sort of movement.  He has no infection in the  head and neck region.  LUNGS:  Clear to auscultation.  HEART:  No murmurs.  Pulses normal.  ABDOMEN:  Soft, nontender.  Bowel sounds normal.  It is protuberant.  EXTREMITIES:  No edema.  He has normal pulses.  NEUROLOGIC:  Mental status:  The patient is awake, alert, attentive,  appropriate.  No dysphasia or dyspraxia.  CRANIAL NERVES:  Round  reactive pupils.  Visual fields full.  Extraocular movements full and  conjugate.  Symmetric facial strength.  Midline tongue.  Motor  examination:  Upper extremities normal, good fine motor movements, no  pronator drift and good finger apposition.  Motor examination of lower  extremities:  3/5 hip flexors, 4/5 psoas, 4+/5 on the right, normal  strength distally.  On the left 4+ /5 hip flexors, 5/5 psoas and  distally 5/5 on the left.  This includes foot dorsiflexors, plantar  flexors, invertors, evertors.   Sensory examination was inconsistent.   Initially it seems as if he had a  hypesthesia to pinprick on the left side to the nipple region.  On  further assessment, however, it was not persisted.  The patient has  peripheral polyneuropathy.  He does not show signs of radiculopathy or  myelopathy with a sensory level.  He had good proprioceptive sense he  had vibration that was better in his thumbs than in his toes.  He had  good stereognosis bilaterally.  Cerebellar examination:  Good finger-to-  nose, heel-knee-shin went well with the left leg but he could not use  the right leg well enough to do it.  Reflexes were absent.  He had  bilateral neutral plantar responses.   IMPRESSION:  The patient has evidence of right lower extremity weakness.  (784.69,629.52)  I do not think this is related to myelopathic or  radiculopathic changes.  I discussed the case with Dr. Constance Goltz, who is  very concerned about the degree of cervical spinal stenosis and  recommended that we carry out a cervical MRI even though the patient has  normal strength in his upper extremities in order for [place] to make certain  that the cord has not been further injured with his falls.  He also  recommended a CT scan of the C-spine to rule out subtle fracture that  could not be seen with plain films.  We will cancel the MRI of thoracic  and lumbar spine.  I see no reason to carry those out.  There is nothing  else to do to evaluate his neuropathy or his weakness.  He needs  physical therapy with a walker for gait training.  I appreciate the  opportunity to participate in his care.  I will discuss this with my  partner, Dr. Avie Echevaria, and ask him to follow the patient in my  absence.      [name], M.D.  Electronically Signed     WHH/MEDQ  D:  03/04/2008  T:  03/04/2008  Job:  [id number]   cc:   Lonia Blood, M.D.  Lenon Curt Chilton Si, M.D.  Payton Doughty, M.D.  Ollen Gross, M.D.  Bertram Millard. Hyacinth Meeker, M.D.

## 2011-05-04 NOTE — Op Note (Signed)
NAMEJURGEN, GROENEVELD                 ACCOUNT NO.:  [id number]   MEDICAL RECORD NO.:  [id number]          PATIENT TYPE:  AMB   LOCATION:  SDS                          FACILITY:  [facility]   PHYSICIAN:  Claude Manges. Whitfield, M.D.DATE OF BIRTH:  07/30/1940   DATE OF PROCEDURE:  10/09/2005  DATE OF DISCHARGE:                                 OPERATIVE REPORT   PREOPERATIVE DIAGNOSIS:  Draining (sterile) wound right shoulder.   POSTOPERATIVE DIAGNOSIS:  Draining (sterile) wound right shoulder.   PROCEDURE:  Irrigation and debridement right shoulder wound, partial rotator  cuff tear repair.   SURGEON:  Claude Manges. Cleophas Dunker, M.D.   ANESTHESIA:  General anesthesia.   COMPLICATIONS:  None.   HISTORY:  A 70 year old gentleman who is approximately a month status post  arthroscopic subacromial decompression of his right shoulder with a mini  open rotator cuff tear repair.  He had a very large tear requiring a  supplemental DePuy Restore patch.  Approximately 10 days ago, he developed a  reddened area at the inferior aspect of his wound.  This was I&Dd with  subsequent negative cultures nor was there any evidence of white cells  present.  Subsequent cultures were negative.  He had persistent clear yellow  drainage and is now to have an I&D.   DESCRIPTION OF PROCEDURE:  Patient comfortable on the operating table.  Under general orotracheal anesthesia, the patient is placed in a semi-  sitting position with the shoulder frame. The right shoulder was then  prepped with DuraPrep from the base of the neck circumferentially below the  elbow.  Sterile draping was performed.  The previous mini incision  approximately an inch in length was utilized.  The sinus at the very  inferior aspect was elliptically excised.  The wound was incised to the  level of the deltoid fascia which was also incised.  There was still Vicryl  stitch present which were removed.  Self-retaining retractor was inserted.  At that  point, the subacromial space was identified.  The previous rotator  cuff tear repair had failed and there was considerable retraction of the  rotator cuff towards the superior glenoid.  This involved tear of the  subscapularis, the infra- and supraspinatus.  There were multiple Ethibond  stitches that were removed.  The previously inserted patch was not visible  and had undoubtedly dissolved.  All foreign body material was then removed.  The wound was copiously irrigated with saline solution.  There was no  evidence of any purulence.  At that point, the wound appeared to be  perfectly clean.  The edges of the rotator cuff were round and were fixed in  a retracted position.  I could not advance except for posteriorly and  laterally where I could advance the infraspinatus.  The edge was surrounded  and I repaired these with #1 Vicryl suture.  The remaining of the cuff  involved a massive irreparable tear.  I again irrigated the wound and could  not find any further tissue to repair.  There was already degenerative  change in the humeral head.  The wound was again irrigated with saline solution, the deltoid fascia  closed with interrupted #1 Vicryl, the skin closed with skin clips.  Sterile  bulky dressing was applied followed by sling.   PLAN:  Percocet for pain.  Keflex prophylactically.  Office in one week.      Claude Manges. Cleophas Dunker, M.D.  Electronically Signed     PWW/MEDQ  D:  10/09/2005  T:  10/09/2005  Job:  [id number]

## 2011-05-04 NOTE — Discharge Summary (Signed)
[name], [name]                 ACCOUNT NO.:  [id number]   MEDICAL RECORD NO.:  [id number]          PATIENT TYPE:  INP   LOCATION:  1517                         FACILITY:  [facility]   PHYSICIAN:  Ollen Gross, M.D.    DATE OF BIRTH:  09-05-1941   DATE OF ADMISSION:  11/18/2006  DATE OF DISCHARGE:  11/22/2006                               DISCHARGE SUMMARY   ADMITTING DIAGNOSES:  1. Osteoarthritis, right knee.  2. Arthritis.  3. Carpal tunnel.  4. Hypercholesterolemia.  5. History of cervical fracture.  6. Depression.  7. Chronic pain.  8. Hypertension.   DISCHARGE DIAGNOSES:  1. Osteoarthritis, right knee, status post right total knee      replacement arthroplasty.  2. Mild acute blood loss anemia, did not require transfusion.  3. Postop hyponatremia, improved.  4. Arthritis.  5. Carpal tunnel.  6. Hypercholesterolemia.  7. History of cervical fracture.  8. Depression.  9. Chronic pain.  10.Hypertension.   PROCEDURE:  November 18, 2006, right total knee, surgeon Dr. Lequita Halt,  assistant Avel Peace, PA-C.  Anesthesia general, postop Marcaine pain  pump.  Tourniquet time 43 minutes.   CONSULTS:  None.   BRIEF HISTORY:  [name] is a 70 year old male with end-stage arthritis of  the right knee with progressive worsening pain and dysfunction,  refractory to nonoperative management, now presents for total knee  arthroplasty.   LABORATORY DATA:  Preop CBC showed a hemoglobin of 14.2, hematocrit  41.4, white cell count 10.1.  Serial H&H's followed.  Followup H&H after  surgery was 13.0 and last noted H&H was 12.3 and 36.3.  PT/PTT on  admission 13.1 and 33, respectively with an INR of 1.0.  Serial pro  times followed.  Last noted PT/INR 29.3 and 2.6.  Chem panel on  admission, mildly elevated BUN of 39.  Remaining chem panel within  normal limits.  Serial BMETs are followed.  BUN came down to a normal  level of 8.  Sodium dropped from 137 to 131, back up to 133.  Glucose  __________ back down to 136.  Preop UA negative.  Blood group type A  positive.   X-RAYS:  Chest x-ray November 12, 2006, no acute abnormality.  EKG,  October 17, 2006, normal sinus rhythm when compared to October 17, 2006,  no significant change since last tracing confirmed by Dr. Olga Millers.   HOSPITAL COURSE:  The patient admitted to [facility],  tolerated the procedure well, later taken to the recovery room and the  orthopedic floor, started on PCA and p.o. analgesics for pain control  following surgery.  Had a fair amount of pain and some nausea on the  night of surgery and day 1.  Working on pain control.  He had been on  high dose OxyContin 3 times a day and also Percocet preoperatively due  to multiple orthopedic issues.  Belly was a little distended on day 1,  used liquid diet, went light on his foods.  Sodium was a little bit low,  but we changed his fluids.  He had excellent urine output.  By day 2, he  was doing better.  Pain was a little better.  He was still nauseated on  the morning of day 2, but that did improve throughout the day with  antiemetics.  He had a little superficial abrasion on the anterior  aspect of the tibia.  This was noted preoperatively also, so we stopped  the compression device on that leg.  His hemoglobin at that point was  12.3.  He had been weaned over to p.o. medications.  PCA was  discontinued.  Eventually the nausea resolved.  Hep-Lock his fluids.  Slowly progressing with physical therapy initially, and there was some  consideration of whether he would need rehab or skilled nursing  facility; however, by day 3, he turned the corner.  Pain was under  better control, and he started getting up and ambulating about 150 feet.  Dressing was changed on day 2.  Incision looked good.  Continued to  progress with therapy and by day 4, November 22, 2006, he was doing well,  tolerating medications and was ready to go home.   DISCHARGE  PLAN:  1. The patient discharged home on November 22, 2006.  2. Discharge diagnoses, please see above.  3. Discharge medications:  He will continue his OxyContin at home,      Percocet 10, Robaxin 500, and also Coumadin.   DIET:  Resume home diet.   FOLLOW UP:  Two weeks from surgery.  He will call the office and set up  an appointment to follow up with the PA on December 19 or December 20 on  Wednesday or Thursday.  Call for an appointment time.   ACTIVITY:  Weightbearing as tolerated, home health PT, home health  nursing, total knee protocol.   DISPOSITION:  Home.   CONDITION ON DISCHARGE:  Improved.      [name], P.A.      Ollen Gross, M.D.  Electronically Signed    ALP/MEDQ  D:  11/22/2006  T:  11/22/2006  Job:  [id number]   cc:   Alanson Puls, M.D.

## 2011-05-04 NOTE — H&P (Signed)
[name], [name]                 ACCOUNT NO.:  [id number]   MEDICAL RECORD NO.:  [id number]          PATIENT TYPE:  EMS   LOCATION:  MAJO                         FACILITY:  [facility]   PHYSICIAN:  [name], M.D.DATE OF BIRTH:  07/30/1940   DATE OF ADMISSION:  12/04/2004  DATE OF DISCHARGE:                                HISTORY & PHYSICAL   CHIEF COMPLAINT:  Chest tightness.   HISTORY OF PRESENT ILLNESS:  [name] is a 70 year old white male with a  history of hypertension and hyperlipidemia, and obesity, who originally was  on his way to [name]'s office today, when he began having  what he describes as a sensation of his chest and his head feeling big  while in his car.  He said he sat in the car for a while and then went into  [name] office and complained of feeling of jaw tightness and  occasional left arm jerking.  The staff at [name] office called  EMS.  The patient was given several aspirin via EMS and then Nitrol paste  here in the ED at [facility].  The patient says he still feels some chest  discomfort and jaw tightness, but is most concerned about the occasional  left arm twitching.   REVIEW OF SYSTEMS:  CONSTITUTIONAL:  No fever or fatigue.  CARDIOVASCULAR:  Please see above.  RESPIRATORY:  No shortness of breath.  GI:  No nausea,  vomiting or diarrhea.  NEUROLOGIC:  Mild headache during this episode.  ENT:  No upper respiratory symptoms.  EYES:  He did notice some white streaks in  his vision a couple of weeks ago.  He does see Dr. Molly Maduro L. Travis every 6  months.   PAST MEDICAL HISTORY:  1.  Hyperlipidemia.  2.  Obesity.  3.  Borderline personality.  4.  Allergic rhinitis.  5.  Osteoarthritis of the spine.  6.  Diverticulitis of the colon.  7.  Hypertension.  8.  Gastroesophageal reflux.  9.  Chronic cervical pain as well as osteoarthritis of the knees and a      history of occipital neuralgia secondary to entrapment,  status post C1      fracture.   MEDICATIONS:  1.  Colace t.i.d. over the counter.  2.  Hydrochlorothiazide 25 mg p.o. daily.  3.  Lipitor 20 mg half a tab p.o. nightly.  4.  Fosamax daily.  5.  __________  150 mg p.o. b.i.d.  6.  OxyContin 40 mg p.o. t.i.d.  7.  Prevacid 30 mg p.o. daily.  8.  He says he is off Xanax.   ALLERGIES:  No known drug allergies.   PAST SURGICAL HISTORY:  1.  Bilateral TMJ arthroscopies in 1988.  2.  Repair of occipital neuralgia entrapment in 2005.   SOCIAL HISTORY:  He denies any alcohol, drugs or tobacco.  He currently  lives alone.  His sister and her husband are here with him today.  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   FAMILY HISTORY:  No  coronary artery disease, hypertension, diabetes or  cancers.   PHYSICAL EXAMINATION:  VITAL SIGNS:  Temperature 97.5, pulse 66,  respirations 16, blood pressure 149/83 and 100% on room air.  GENERAL:  In no acute distress, a very pleasant patient who appears to have  normal insight and judgment.  HEENT:  Eyes:  Extraocular movements are intact.  Pupils are equal, round  and reactive to light.  Oropharynx is clear.  He does have several teeth  missing.  External ears are normal with clear TMs bilaterally.  NECK:  No thyromegaly, no masses.  HEART:  Regular rate and rhythm with a 3/6 systolic ejection murmur best  heard at the right sternal border.  No lower extremity edema.  Carotids are  negative for bruits.  MUSCULOSKELETAL:  His strength is 5/5 in the upper extremities, hands and  lower extremities.  GI:  He has an obese abdomen, but is soft and nontender with normal bowel  sounds and no organomegaly.  SKIN:  No noticeable rashes.  NEUROLOGIC:  Cranial nerves II-XII intact and biceps reflexes are 1+  bilaterally.   LABORATORIES:  EKG:  Sixty-eight beats per minute, normal sinus rhythm with  no Q wave or ST-T wave changes.   Head CT showed no acute intracranial  abnormality.   Chest x-ray is an AP and shows a borderline-sized heart, but the bases were  not well-visualized.   Cardiac markers were negative x3.  White count was elevated at 16,  hemoglobin 14.5, platelets 217,000.  PT 13.2, INR 1, PTT of 33 and BMET was  normal with a creatinine of 0.8.   ASSESSMENT AND PLAN:  1.  Chest pain with jaw tightness, question if this could be cardiac.  We      will admit for 23-hour observation and do a cardiac panel x2.  His risk      factor do include his age, hypertension and hyperlipidemia.  He is      currently on blood pressure medications and lipid medications.  He is      not on a beta blocker, but we will evaluate this as patient does have a      cardiac component to this pain.  Also consider gastrointestinal.      Unclear if patient is really taking his Prevacid, but we will give a      gastrointestinal cocktail and see if this does help as well.  2.  Arm twitching.  The patient seems very concerned about this.  Consider      causes such as worsening stenosis at C1-C2 and because of his history of      a C1 fracture, it would be odd for this twitching to suddenly start and      there are no signs of weakness or nerve damage in that arm.  3.  Hypertension.  Continue hydrochlorothiazide.  4.  Chronic pain.  We will continue OxyContin and Colace.     CM/MEDQ  D:  12/04/2004  T:  12/05/2004  Job:  [id number]

## 2011-05-04 NOTE — Discharge Summary (Signed)
NAMEZACHREY, [name]                 ACCOUNT NO.:  [id number]   MEDICAL RECORD NO.:  [id number]          PATIENT TYPE:  OBV   LOCATION:  2031                         FACILITY:  [facility]   PHYSICIAN:  Wayne A. Sheffield Slider, M.D.    DATE OF BIRTH:  07/30/1940   DATE OF ADMISSION:  12/04/2004  DATE OF DISCHARGE:  12/07/2004                                 DISCHARGE SUMMARY   DISCHARGE DIAGNOSES:  1.  Gastroesophageal reflux disease.  2.  Abnormal electroencephalogram.  3.  Hypertension.  4.  Hyperlipidemia.  5.  Osteoarthritis.   DISCHARGE MEDICATIONS:  1.  Hydrochlorothiazide 25 mg one tablet p.o. daily.  2.  Lipitor 10 mg p.o. daily.  3.  Aspirin 325 mg p.o. daily.  4.  OxyContin 40 mg p.o. t.i.d.  5.  Lyrica 150 mg p.o. b.i.d.  6.  Mobic 7.5 mg p.o. daily.  7.  Prevacid 30 mg p.o. daily.  8.  Senokot S one p.o. b.i.d.   FOLLOW UP:  The patient has an appointment with Dr. Mardelle Matte at [facility] on December 13, 2004 at 11:00 A.M.  The patient will also have an  appointment scheduled with neurology before discharge.  At the time of  dictation this has not yet been arranged.   PROCEDURES AND DIAGNOSTIC STUDIES:  1.  Electrocardiogram showing no acute ST changes.  2.  Electroencephalogram showing abnormality of moderate generalized      slowing.  3.  Cardiolite showing no evidence of myocardial ischemia or infarction.      Normal wall motion and ejection fraction 61%.  4.  Chest x-ray showing diffuse peribronchial thickening, left basilar      atelectasis and/or scarring.  5.  Head CT scan showing no CT scan evidence for acute intracranial      abnormality.   CONSULTATIONS:  Peter M. Swaziland, M.D., cardiology.   HISTORY OF PRESENT ILLNESS:  Admission history and physical revealed a 70-  year-old white male with history of hypertension, hyperlipidemia and obesity  who originally was on his way to his orthopedic's office when he described a  sensation in his chest that was  abnormal along with having a big feeling  in his head while he sat in his car.  He says when he went into Dr.  Hoy Register office he complained of jaw tightness and occasional left arm  jerking.  The staff at Dr. Hoy Register office called EMS and he was  transported to [facility].  On examination notable points  included patient was afebrile with stable vital signs.  Neurologically his  examination was unrevealing.  Cardiac examination was notable for regular  rate and rhythm with 3/6 systolic ejection murmur heard best at the right  sternal border.  Chest x-ray shows a borderline size heart but the bases  were not well visualized.  Head CT scan showed no acute intracranial  abnormalities.  Electrocardiogram showed no ST changes.  Given the patient's  risk factors and his atypical story he was admitted for rule out myocardial  infarction.   HOSPITAL COURSE:  PROBLEM #1:  GASTROESOPHAGEAL REFLUX DISEASE:  The patient  was admitted for this atypical episode of chest pain.  He was ruled out for  myocardial infarction.  In the process his troponin levels were normal,  however, his CK-MB fractions were slightly elevated with a CK-MB of 11.7.  Given this conflicting set of data with a normal troponin level, abnormal CK-  MB and no acute ST changes on electrocardiogram, cardiology was consulted.  The patient was taken for adenosine Cardiolite which was negative as listed  above.  Notably the patient does have a history of gastroesophageal reflux  disease and while not having similar events during this hospitalization, he  did have some epigastric pressure/sensation that the patient states was  similar to previous episode of gastroesophageal reflux disease.  These were  relieved somewhat with p.o. Maalox.  The patient was restarted on his proton  pump inhibitor and discharged home.  There was no evidence of pleural nor  cardiac cause for this patient's presenting sensations.    PROBLEM #2:  ABNORMAL ELECTROENCEPHALOGRAM:  The patient has no history of  seizure and no history of head trauma preceding this and did not appear to  be postictal on presentation to the emergency room.  The patient's  neurological examination was nonfocal.  His head CT scan was normal and the  patient was sent for electroencephalogram with plan of outpatient  neurological follow up.  The patient's electroencephalogram, as stated  above, had an abnormality with moderate generalized slowing.  Given this was  nonspecific and the patient was thought to be stable, he was discharged home  with anticipated follow up to be scheduled with [facility].   PROBLEM #3:  HYPERTENSION:  The patient has a history of hypertension and  was continued on his home dose of hydrochlorothiazide during this  hospitalization.   PROBLEM #4:  INCREASED LIPIDS:  The patient has a history of increased  lipids.  Fasting lipid panel was checked during this hospitalization.  Total  cholesterol was 152, triglycerides was 68, HDL 54, LDL 84.  Patient was  treated with statins and his statins were continued during this  hospitalization without complications.   PROBLEM #5:  OSTEOARTHRITIS:  The patient has a history of osteoarthritis  for which he is on multiple chronic pain medications.  These were continued  during the hospitalization without complications.   DISCHARGE LABORATORY DATA:  Lipid profile as listed above.  Final cardiac  panel showing CK 331, CK-MB 11.7, troponin-I 0.02. PTT 33.  Pro Time 1.  Initial chemistries on intake were completely within normal limits with the  exception of a glucose of 101.       GSD/MEDQ  D:  12/07/2004  T:  12/08/2004  Job:  [id number]   cc:   Asencion Partridge, M.D.  Fax: [phone]   Peter M. Swaziland, M.D.  [address], [address]  [place], [place] [address]  Fax: [phone]   [facility]

## 2011-05-04 NOTE — H&P (Signed)
NAME:  [name], [name]                           ACCOUNT NO.:  [id number]   MEDICAL RECORD NO.:  [id number]                   PATIENT TYPE:  INP   LOCATION:  3172                                 FACILITY:  [facility]   PHYSICIAN:  Payton Doughty, M.D.                   DATE OF BIRTH:  07/30/1940   DATE OF ADMISSION:  04/18/2004  DATE OF DISCHARGE:                                HISTORY & PHYSICAL   ADMISSION DIAGNOSIS:  Occipital neuralgia.   SERVICE:  Neurosurgery.   HISTORY OF PRESENT ILLNESS:  This is a very nice, now 70 year old, right-  handed white gentleman who in February of 2004 was seen by Dr. Lovell Sheehan for  pain in the occiput.  He had fallen at work and developed a C1 fracture.  His CT scanning demonstrates a nonhealed fracture at C1 on the right side  where the occipital nerve exits.  He is now admitted for exploration of the  right occipital nerve.   PAST MEDICAL HISTORY:  Remarkable for rotator cuff injury.   MEDICATIONS:  1. OxyContin 40 mg b.i.d.  2. An anti-inflammatory.  3. Baclofen 10 mg t.i.d.   ALLERGIES:  None.   PAST SURGICAL HISTORY:  Rotator cuff repair.   FAMILY HISTORY:  Mother died at 11 of cervical cancer.  Father died at 91 of  lung cancer.   SOCIAL HISTORY:  He does not drink does not smoke, and works for the [employer].   PHYSICAL EXAMINATION:  HEENT:  Within normal limits.  He has pain when he  turns his head towards the right, and it is in the distribution of the  occipital nerve.  CHEST:  Clear.  CARDIAC:  Regular rate and rhythm.  ABDOMEN:  Nontender with no hepatosplenomegaly.  EXTREMITIES:  Without clubbing or cyanosis.  GU: Exam deferred.  PERIPHERAL PULSES:  Good.  NEUROLOGIC:  Awake, alert, and oriented.  Cranial nerves are intact.  He has  positive Tinel's right over the C1 posteriorly.  Motor exam demonstrates 5/5  strength throughout the upper and lower extremities.  There is no current  sensory deficit.  Reflexes are 2 at  the biceps, triceps, brachial radialis.  Hoffman's is negative.  Lower extremity reflexes are 1.  Toes are downgoing.   CLINICAL IMPRESSION:  Occipital neuralgia related to his C1 fracture.   PLAN:  Exploration of the right C1 root at the fracture site.  The risks and  benefits of this approach have been discussed with him, and he wishes to  proceed.                                                Payton Doughty, M.D.    MWR/MEDQ  D:  04/18/2004  T:  04/18/2004  Job:  [id number]

## 2011-05-04 NOTE — Op Note (Signed)
[name], [name]                 ACCOUNT NO.:  [id number]   MEDICAL RECORD NO.:  [id number]          PATIENT TYPE:  INP   LOCATION:  2899                         FACILITY:  [facility]   PHYSICIAN:  Payton Doughty, M.D.      DATE OF BIRTH:  07/30/1940   DATE OF PROCEDURE:  05/18/2005  DATE OF DISCHARGE:                                 OPERATIVE REPORT   PREOPERATIVE DIAGNOSIS:  Occiput C1, C1-C2 instability.   POSTOPERATIVE DIAGNOSIS:  Occiput C1, C1-C2 instability.   OPERATION PERFORMED:  Occiput to C4 posterior cervical fusion.   SURGEON:  Payton Doughty, M.D.   ANESTHESIA:  General endotracheal.   PREP:  Sterile Betadine prep and scrub with alcohol wipe.   COMPLICATIONS:  None.   NURSE ASSISTANT:  [place].   DOCTOR ASSISTANT:  Danae Orleans. Venetia Maxon, M.D.   INDICATIONS FOR PROCEDURE:  This is a 70 year old gentleman with an unstable  C1 fracture.   DESCRIPTION OF PROCEDURE:  The patient was taken to the operating room,  smoothly anesthetized, intubated, and placed prone on the operating table in  the [place] headholder.  Following shave, prep and drape in the usual  sterile fashion, skin was infiltrated with 1% lidocaine 1:400,000  epinephrine and the skin was incised from the inion down to the C6.  The  occipital squama top of the ring C1, C2, C3 and C4 were exposed bilaterally  in the subperiosteal plane.  The lateral masses at C3 and C4 were exposed as  was lateral mass at C2.  Using the standard land marks, lateral mass screws  were placed in C2 and C3 and interlaminar screws were placed in C2.  A  suboccipital plate was placed.  Rod was contoured to fit through the screws.  Side connectors were used in the translaminar screws.  The locking caps were  placed and tightened down.  The intraoperative x-ray demonstrated good  placement of the occipital and C2 screws.  C3 and C4 could not be visualized  because of the patient's body habitus.  The lateral masses were decorticated  as  well as the occiput and the bone morphogenic protein was placed on the  collagen matrix with a Vitoss extensor.  The fascia and subcutaneous tissues  were reapproximated with 0  Vicryl in interrupted fashion.  Subcutaneous tissues were reapproximated  with 0 Vicryl in interrupted fashion.  Skin was closed with 3-0 nylon in  running locked fashion.  Betadine Telfa dressing was applied and made  occlusive with OpSite and the patient returned to recovery room in good  condition.      MWR/MEDQ  D:  05/18/2005  T:  05/18/2005  Job:  [id number]

## 2011-05-04 NOTE — Procedures (Signed)
REQUESTING PHYSICIAN:  Melvyn Novas, M.D.   CLINICAL HISTORY:  A 70 year old man with involuntarily jerking movements of  the left arm. EEG is performed for evaluation of possible seizure.   DESCRIPTION:  The dominant rhythm of this tracing is a moderate amplitude  theta rhythm of 7 hertz which predominates posteriorly and appears without  abnormal asymmetry and attenuates with eye opening and closing. Some low  amplitude fast activity is seen frontally appears without abnormal  asymmetry. No focal slowing is noted. No epileptiform discharges are seen.  Drowsiness occurs naturally as evidenced by fragmentation of the background  and generalized attenuation of rhythms, but no findings of stage 2 sleep are  seen. Photic stimulation produced no significant driving responses.  Hyperventilation was not performed.  Single channel devoted to EKG revealed  sinus rhythm throughout with rate of approximately 66 beats per minute.   CONCLUSION:  Abnormal study due to the presence of moderately severe slowing  of the background rhythms, a finding suggestive of diffuse widespread  cerebral dysfunction and consistent with a mild drowsy and/or  encephalopathic state. No focal slowing is noted and no epileptiform  discharges are seen.      QVZ:DGLO  D:  12/06/2004 20:49:05  T:  12/07/2004 14:04:35  Job #:  [id number]

## 2011-05-04 NOTE — Op Note (Signed)
NAMEDEMARQUIS, [name]                 ACCOUNT NO.:  [id number]   MEDICAL RECORD NO.:  [id number]          PATIENT TYPE:  OIB   LOCATION:  5039                         FACILITY:  [facility]   PHYSICIAN:  Claude Manges. Whitfield, M.D.DATE OF BIRTH:  07/30/1940   DATE OF PROCEDURE:  09/07/2005  DATE OF DISCHARGE:                                 OPERATIVE REPORT   PREOPERATIVE DIAGNOSIS:  1.  Rotator cuff tear right shoulder with impingement.  2.  Degenerative joint disease acromioclavicular joint.   POSTOPERATIVE DIAGNOSIS:  1.  Rotator cuff tear right shoulder with impingement.  2.  Degenerative joint disease acromioclavicular joint.  3.  Fraying of anterior glenoid labrum and tear of biceps tendon.   PROCEDURE:  1.  Arthroscopic debridement of shoulder.  2.  Arthroscopic subacromial decompression.  3.  Arthroscopic distal clavicle resection.  4.  Mini-open rotator cuff tear repair with supplemental DePuy restore      patch.   SURGEON:  Claude Manges. Cleophas Dunker, M.D.   ASSISTANT:  Richardean Canal, P.A.-C.   ANESTHESIA:  General orotracheal with supplemental interscalene nerve block.   COMPLICATIONS:  None.   DESCRIPTION OF PROCEDURE:  With the patient comfortable on the operating  table and under general orotracheal anesthesia, the patient was placed in  the semi-sitting position with the shoulder frame.  He did have a  preoperative interscalene nerve block.  The right shoulder was then placed  through a full range of motion without evidence of adhesive capsulitis or  instability.  The shoulder was then prepped with DuraPrep from the base of  the neck circumferentially below the elbow.  Sterile draping was performed.  A marking pen was used to outline the acromion, the coracoid, and the AC  joint.  At a point a fingerbreadth posterior and medial to the posterior  angle of the acromion, a small stab wound was made.  The arthroscope was  easily placed into the shoulder joint.  Diagnostic  arthroscopy revealed  diffuse osteoarthritis of the glenohumeral joint with diffuse chondromalacia  of both the glenoid and the humeral head.  There was a tear of the biceps  tendon approximately an inch from its attachment to the superior glenoid.  A  second portal was established anteriorly and shaving of the biceps tendon  stump was performed.  There was also an obvious large rotator cuff tear as  we could visualize the subacromial space.  There was considerable synovitis  which was also resected.   The arthroscope was then placed in the subacromial space posteriorly and the  cannula placed in the subacromial space anteriorly.  A third portal was  established in the lateral subacromial space.  An arthroscopic debridement  of considerable bursal tissue was performed, there was obvious overhang of  the anterior acromion and this was resected with a 6 mm bur.  There was  considerable synovitis around the [facility] joint and this was debrided with the  ArthroCare Wand and the shaver.  A distal clavicle resection was performed  with the 6 mm bur with what I felt was a very nice resection.  The instruments were removed and the mini-open rotator cuff repair was then  performed.  About 1 to 1 1/2 inch incision was made along the junction of  the anterior acromion and via sharp dissection, carried down through the  subcutaneous tissue.  The needle tip Bovie was used to incise the tissue and  with good good hemostasis.  The deltoid fascia was incised and the  subacromial space was entered.  The rotator cuff tear was identified,  extended from the subscapularis medially to the infraspinatus laterally and  it was probably 2 inches in length.  The edges were rounded.  There was some  further bursal tissue which was resected more posteriorly.  There was  retraction and I had some difficulty advancing it anteriorly, so with finger  dissection, I was able to free up the adhesions posteriorly to allow  further  advancement.  At that point, the edges were debrided, I established some  bleeding bone along the edge of the cuff, and I was able to repair the  subscapularis and the infraspinatus with #1 Ethibond.  There was an area of  approximately an inch in the mid portion of the tear that I repaired with a  Mitek four prong anchor.  Because I thought there was considerable atrophy,  I supplemented the repair with a restore SIS patch suturing it with 2-0  Ethibond.  There was no evidence of impingement.   The wound was then copiously irrigated with saline solution.  The deltoid  fascia was closed anatomically with a running 0 Vicryl, the subcu with 2-0  Vicryl, and the skin with skin clips.  A sterile, bulky dressing was applied  followed by a sling.   PLAN:  23 hour observation, discharge in a.m. on Percocet, office one week.      Claude Manges. Cleophas Dunker, M.D.  Electronically Signed     PWW/MEDQ  D:  09/07/2005  T:  09/08/2005  Job:  [id number]

## 2011-05-04 NOTE — H&P (Signed)
NAMEHUMZA, TALLERICO                 ACCOUNT NO.:  [id number]   MEDICAL RECORD NO.:  [id number]          PATIENT TYPE:  INP   LOCATION:  1517                         FACILITY:  [facility]   PHYSICIAN:  Ollen Gross, M.D.    DATE OF BIRTH:  December 17, 1941   DATE OF ADMISSION:  11/18/2006  DATE OF DISCHARGE:                              HISTORY & PHYSICAL   DATE OF OFFICE VISIT/HISTORY AND PHYSICAL:  November 14, 2006.   DATE OF ADMISSION:  November 18, 2006.   CHIEF COMPLAINT:  Right knee pain.   HISTORY OF PRESENT ILLNESS:  The patient is a 70 year old male who has  been seen by Dr. Homero Fellers Aluisio for a second opinion on ongoing right  knee.  He is known to have significant arthritis for quite some time now  with progressive pain and dysfunction.  X-rays show bone on bone medical  compartment with some patellar femoral spurring.  He does have a little  bit of narrowing of the left knee.  He has reached the point where has  continued problems and could benefit from undergoing surgery.  The risks  and benefits were discussed and the patient was subsequently admitted to  the hospital.   ALLERGIES:  No known drug allergies.   CURRENT MEDICATIONS:  1. OxyContin.  2. Percocet.  3. Celebrex.  4. Clonazepam.  5. Budeprion.  6. Lipitor.  7. Enalapril/hydrochlorothiazide.  8. Fosamax.   PAST MEDICAL HISTORY:  1. Arthritis.  2. Carpal tunnel.  3. Hypercholesterolemia.  4. History of cervical fracture.  5. Depression.  6. Chronic pain.  7. Hypertension.   PAST SURGICAL HISTORY:  Right shoulder surgery x2.  Left shoulder  surgery x1.  Cervical surgery x2.  Hydrocele surgery.   FAMILY HISTORY:  Mother deceased age 54 cervical cancer.  Father  deceased age 15 of lung cancer.   SOCIAL HISTORY:  He is single, no children.  Denies history of tobacco  products, alcohol products.   REVIEW OF SYSTEMS:  GENERAL:  No fevers, chills, night sweats.  NEUROLOGICAL:  No seizures, syncope or  paralysis. RESPIRATORY:  No  productive cough, hemoptysis or shortness of breath.  CARDIOVASCULAR:  No chest pain, angina or orthopnea. GASTROINTESTINAL:  No nausea,  vomiting, diarrhea, constipation, no blood or mucous in the stool.  GENITOURINARY:  No dysuria, hematuria, discharge.  MUSCULOSKELETAL:  Right knee.   PHYSICAL EXAMINATION:  VITAL SIGNS:  Pulse 68, respirations 14, blood  pressure 148/88.  GENERAL APPEARANCE:  70 year old white male, well-nourished, well-  developed, average historian, slightly overweight, no acute distress.  He is alert, oriented and cooperative.  HEENT: Normocephalic, atraumatic. Pupils equal, round, reactive.  Oropharynx clear.  Extraocular movements intact.  NECK:  He has marked reduction in movement, very limited flexion, very  little lateral left and right rotation.  No bruits.  CHEST:  Clear anterior and posterior chest wall.  HEART:  Regular rate and rhythm with a faint early systolic ejection  murmur noted over the left sternal border.  ABDOMEN:  Soft, round, bowel sounds present.  RECTAL, BREASTS, GENITOURINARY: Not done, not pertinent to  present  illness.  EXTREMITIES:  Right knee:  No effusion.  Motor function intact, 5 to 115  on range of motion.  No instability.   IMPRESSION:  Right knee pain.   PLAN:  Patient is admitted to [facility] to undergo a right  total knee arthroplasty.  Surgery will be performed by Dr. Ollen Gross.  He has been seen by his medical physician, Dr. Alanson Puls-  Crawford preoperatively and felt to be stable for the elective  procedure.      Alexzandrew L. Julien Girt, P.A.      Ollen Gross, M.D.  Electronically Signed    ALP/MEDQ  D:  11/17/2006  T:  11/18/2006  Job:  [id number]   cc:   Alanson Puls, M.D.

## 2011-05-04 NOTE — Discharge Summary (Signed)
NAMERIHAN, SCHUELER                 ACCOUNT NO.:  [id number]   MEDICAL RECORD NO.:  [id number]          PATIENT TYPE:  INP   LOCATION:  3034                         FACILITY:  [facility]   PHYSICIAN:  Payton Doughty, M.D.      DATE OF BIRTH:  07/30/1940   DATE OF ADMISSION:  05/18/2005  DATE OF DISCHARGE:  05/28/2005                                 DISCHARGE SUMMARY   ADMISSION DIAGNOSIS:  C1 fracture of the occiput with C1-2 instability and  occiput C1 instability.   PROCEDURE:  Occiput to C4 posterior cervical fusion.   COMPLICATIONS:  None.   DISCHARGE STATUS:  Well.   BODY OF TEXT:  This is a 70 year old right-handed white gentleman whose  history and physical is recounted on the chart. He has had a C1  fracture  and developed instability with intractable neck pain and occipital  neuralgia. Medical history is remarkable for rotator cuff and prior surgery  for decompression of greater occipital nerve.   MEDICATIONS:  OxyContin 40 mg b.i.d., an anti-inflammatory, Baclofen, and  Roxicodone.   General exam showed pain with head movement. Films demonstrated 4 mm of  subluxation of 3 on 4 and on neurologic exam he had a positive Lhermitte's  sign.   He was admitted after obtaining laboratory values and underwent occiput to  C4 fusion. Postoperatively he has done reasonably well. His incision had  some leakage which is anticipated. However, his strength remains full, a  little of transient numbness in his hands which is resolved. He is currently  full strength and after waiting four to five days on oral ciprofloxacin his  incision has stopped draining. He has remained afebrile throughout his  hospital course. Currently, his strength is full, his incision is dry and  well healing. He is being discharged home to the care of his family. He is  to follow up will the [facility] office later this  week for suture removal.       MWR/MEDQ  D:  05/28/2005  T:   05/28/2005  Job:  [id number]

## 2011-05-04 NOTE — Op Note (Signed)
NAMEGIANN, [name]                 ACCOUNT NO.:  [id number]   MEDICAL RECORD NO.:  [id number]          PATIENT TYPE:  INP   LOCATION:  1517                         FACILITY:  [facility]   PHYSICIAN:  Ollen Gross, M.D.    DATE OF BIRTH:  04/15/1941   DATE OF PROCEDURE:  11/18/2006  DATE OF DISCHARGE:                               OPERATIVE REPORT   PREOPERATIVE DIAGNOSIS:  Osteoarthritis, right knee.   POSTOPERATIVE DIAGNOSIS:  Osteoarthritis, right knee.   OPERATION PERFORMED:  Right total knee arthroplasty.   SURGEON:  Ollen Gross, M.D.   ASSISTANT:  Alexzandrew L. Julien Girt, P.A.   ANESTHESIA:  General with postoperative Marcaine pain pump.   ESTIMATED BLOOD LOSS:  Minimal.   DRAINS:  Hemovac x1.   TOURNIQUET TIME:  43 minutes at 350 mmHg.   COMPLICATIONS:  None.   CONDITION:  Stable to recovery.   INDICATIONS FOR PROCEDURE:  [name] is a 70 year old male with end-stage  arthritis of the right knee with progressively worsening pain and  dysfunction.  He has failed nonoperative management and presents for  total knee arthroplasty.   DESCRIPTION OF PROCEDURE:  After successful administration of general  anesthetic a tourniquet was placed high on the right thigh and right  lower extremity prepped and draped in the usual sterile fashion.  Extremity was wrapped in an Esmarch and the knee flexed.  Tourniquet  inflated 300 mmHg.  Midline incision was made with a 10 blade in the  subcutaneous tissue to the level of extensor mechanism.  A fresh blade  was used to make a medial parapatellar arthrotomy.  Soft tissue over the  proximal medial tibia subperiosteally elevated to the joint line with  the knife and into the semimembranosus bursa with the Cobb elevator.  Soft tissue laterally was elevated with attention being paid to avoid  the patellar tendon on tibial tubercle.  The patella was subluxed  laterally, knee flexed 90 degrees.  ACL and PCL removed.  Drill was used  to create  a starting hole in the distal femur and canal was thoroughly  irrigated.  A 5 degree right valgus alignment guide was placed and  referencing off the posterior condyles, rotation was marked and a block  pinned to remove 10 mm off the distal femur.  Distal femoral resection  was made with an oscillating saw.  A size 5 was the most appropriate  femoral component and then the size 5 cutting block is placed with  rotation marked at the epicondylar axis.  The anterior, posterior and  chamfer cuts are made.   Tibia subluxed forward and the menisci are removed.  Extramedullary  tibial alignment guide was placed referencing proximally to the medial  aspect of the tibial tubercle and distally among the second metatarsal  axis and tibial crest.  Blocks pinned to remove 10 mm of the  nondeficient lateral side.  Tibial resection was made with an  oscillating saw. Size 4 is the most appropriate tibial component and the  proximal tibia was prepared with a modular drill and keel punch for the  size 4.  Femoral preparation was completed with the intercondylar cut  for the size 5.   A size 4 mobile bearing tibial trial, size 5 posterior stabilized  femoral trial and a 10 mm posterior stabilized rotating platform insert  trial were placed.  With the 10, full extension was achieved with  excellent varus and valgus balance throughout full range of motion.  Patella was everted and thickness measured to be 26 mm.  Freehand  resection was taken to 15 mm, 41 template was placed, lug holes were  drilled.  Trial patella was placed and it tracks normally.  Osteophytes  were removed off the distal femur with the trial in place.  All trials  were removed and the cut bony surfaces prepared with pulsatile lavage.  Cement was mixed and once ready for implantation, the size 4 mobile  bearing tibial tray, size 5 posterior stabilized femur, a 41 patella  cemented into place and the patella was held with the clamp.  Trial  10  mm insert was placed, knee held in full extension.  All extruded cement  removed.  Once cement was fully hardened, then a permanent 10 mm  posterior stabilized rotating platform insert was placed into the tibial  tray.  Wound was copiously irrigated with saline solution and the  extensor mechanism closed over a Hemovac drain with interrupted #1 PDS.  Flexion against gravity is 130 degrees.  Tourniquet was released with a  total time of 43 minutes.  Subcu was closed with interrupted 2-0 Vicryl,  subcuticular running 4-0 Monocryl.  Catheter for Marcaine pain pump was  placed and the pump was initiated.  Hemovac drains hooked to suction.  Steri-Strips and a bulky sterile dressing were applied.  He was placed  into a knee immobilizer, awakened and transported to recovery in stable  condition.      Ollen Gross, M.D.  Electronically Signed     FA/MEDQ  D:  11/18/2006  T:  11/19/2006  Job:  [id number]

## 2011-05-04 NOTE — Discharge Summary (Signed)
[name], [name]                 ACCOUNT NO.:  [id number]   MEDICAL RECORD NO.:  [id number]          PATIENT TYPE:  INP   LOCATION:  3008                         FACILITY:  [facility]   PHYSICIAN:  Payton Doughty, M.D.      DATE OF BIRTH:  1941/09/09   DATE OF ADMISSION:  01/28/2007  DATE OF DISCHARGE:  01/31/2007                               DISCHARGE SUMMARY   ADMITTING DIAGNOSIS:  Spondylosis with spondylolisthesis, C6-7.   DISCHARGE DIAGNOSIS:  Spondylosis with spondylolisthesis, C6-7.   OPERATIVE PROCEDURE:  Anterior cervical decompression and fusion at C6-  7.   SURGEON:  Payton Doughty, M.D.   SERVICE:  Neurosurgery.   COMPLICATIONS:  None.   DISCHARGE STATUS:  __________.   BODY OF TEXT:  A 70 year old gentleman whose history and physical is  recounted in the chart.  He has had posterior fusion from the occiput  to C4.  He has developed spondylolisthesis at 6-7.  He was admitted  after obtaining normal laboratory values and taken to the operating room  and underwent an attempt at anterior decompression and fusion at C6-7.  I could not access the back at the disk space safely so a bone graft was  placed in the front half of this space and a plate placed across the  construct to prevent further slip.  Postoperatively he has done well.  He did have a chipped tooth from the anesthesia.  As he is up and about  now, full strength, eating and voiding normally.  This patient does have  a tendency to overuse pain medications and he has not done that in the  hospital.  He is ready for discharge.  He is to follow up with me in the  [facility] office in about 2 weeks with an x-ray and  decision will be made whether or not to decompress him posteriorly.  This has been discussed with him.           ______________________________  Payton Doughty, M.D.     MWR/MEDQ  D:  01/31/2007  T:  02/01/2007  Job:  [id number]

## 2011-05-04 NOTE — Op Note (Signed)
[name], [name]                 ACCOUNT NO.:  [id number]   MEDICAL RECORD NO.:  [id number]          PATIENT TYPE:  OIB   LOCATION:  3102                         FACILITY:  [facility]   PHYSICIAN:  Payton Doughty, M.D.      DATE OF BIRTH:  10/06/1941   DATE OF PROCEDURE:  01/28/2007  DATE OF DISCHARGE:                               OPERATIVE REPORT   PREOPERATIVE DIAGNOSIS:  Spondylolisthesis C6 on C7.   POSTOPERATIVE DIAGNOSIS:  Spondylolisthesis C6 on C7.   PROCEDURE:  C6-C7 anterior cervical fusion.   SURGEON:  Payton Doughty, M.D.   ANESTHESIA:  General endotracheal anesthesia.   PREPARATION:  Betadine prep with alcohol wipe.   COMPLICATIONS:  Inability to access the posterior disc space.   BODY OF TEXT:  This is a 70 year old gentleman with cervical spondylosis  and spondylolisthesis of C6 on C7.  He has had a prior fusion from the  occiput of C4.  He was taken to the operating room, smoothly  anesthetized, and intubated, placed prone on the operating table with  the neck as extended as it could be.  Following shave, prep, and drape  in the usual sterile fashion, the skin incision was made one  fingerbreadth above the clavicle.  The platysma was identified,  elevated, divided and undermined.  The sternocleidomastoid was  identified.  It was retracted laterally to the left exposing the trachea  and esophagus, retracted laterally to the right exposing the carotid and  the bones of the anterior cervical spine.  The carotid was identified  and retracted laterally to the left.  Markers were placed and  interoperative x-ray obtained several times, finally resorting to an AP  film to identify the C4-C5 and C5-C6 levels.   An approach was made to the level below the lowermost marker on the AP  film.  The discectomy was attempted but because of severe angulation  caused by the slip and also by the curvature of the spine, it was not  possible to access the disc space.  The clavicle and  the manubrium were  both in the way.  Therefore, it was decided to access as much of the  space as we could, place a bone graft, put a plate across it to achieve  stability, and at some subsequent date reapproach this patient  posteriorly and decompress in that fashion.  A small graft was placed on  the anterior portion of the disc space and a 12 mm plate was placed with  12 mm screws, two in C6 and two in C7. No x-ray was attempted because it  was not feasible to see this on the lateral C-spine.  The wound was  irrigated.  Hemostasis was assured.  Successive layers of 3-0 Vicryl and  4-0 Vicryl were used to close. Benzoin and Steri-Strips were placed and  made occlusive with Telfa and OpSite.  The patient was placed in an  Aspen collar and returned to the recovery room in good condition.          ______________________________  Payton Doughty, M.D.  MWR/MEDQ  D:  01/28/2007  T:  01/28/2007  Job:  [id number]

## 2011-05-04 NOTE — H&P (Signed)
NAMEGULED, [name]                 ACCOUNT NO.:  [id number]   MEDICAL RECORD NO.:  [id number]          PATIENT TYPE:  INP   LOCATION:  3102                         FACILITY:  [facility]   PHYSICIAN:  Payton Doughty, M.D.      DATE OF BIRTH:  Jul 03, 1941   DATE OF ADMISSION:  01/24/2007  DATE OF DISCHARGE:                              HISTORY & PHYSICAL   ADMITTING DIAGNOSIS:  Spondylolisthesis C6-7.   SURGEON:  Dr. Trey Sailors.   SERVICES:  Neurosurgery.   BODY OF TEXT:  This 70 year old, right-hand, gentleman who has had a  long spine history.  He has had an occiput to C5 fusion in the past.  He  has an anterior decompression fusion and he has had a C1 fracture.  He  has had increasing pain in neck, pain down both arms and CT scanning has  demonstrated severe spondylosis and he is now admitted for an anterior  decompression and fusion at C6-7.   MEDICAL HISTORY:  1. Remarkable for chemical dependence.  2. He has had rotator cuff surgery, as well as neck operations.   FAMILY HISTORY:  Mother died at 59 of cervical cancer.  Dad died at 71  of lung cancer.   SOCIAL HISTORY:  He does not smoke.  Does not drink.  He is on  disability from the city.   HEENT EXAM:  Within normal limits.  NECK:  He has reasonable range of motion in his neck.  CHEST:  Clear.  CARDIAC EXAM:  Regular rate and rhythm.  ABDOMEN:  Large, nontender.  No hepatosplenomegaly.  EXTREMITIES:  No clubbing or cyanosis.  GU EXAM:  Deferred.  Peripheral pulses are good.  NEUROLOGICALLY:  He is awake, alert and oriented.  His cranial nerves  are intact.  Motor exam shows 5/5 strength throughout the upper and  lower extremities over the triceps bilaterally.  Sensation is diminished  in the left C7 distribution.  Reflexes are 1 at the biceps, absent at  the triceps bilaterally.  Hoffmann is negative.   CLINICAL IMPRESSION:  Cervical spondylosis with C7 radiculopathy.   PLAN:  Is for anterior decompression and fusion at  C6-7.  The risks and  benefits of this approach have been discussed with him and he wishes to  proceed.           ______________________________  Payton Doughty, M.D.     MWR/MEDQ  D:  01/28/2007  T:  01/29/2007  Job:  [id number]

## 2011-05-04 NOTE — Op Note (Signed)
NAMEGUNTER, CONDE                 ACCOUNT NO.:  [id number]   MEDICAL RECORD NO.:  [id number]          PATIENT TYPE:  AMB   LOCATION:  ENDO                         FACILITY:  [facility]   PHYSICIAN:  Shirley Friar, MDDATE OF BIRTH:  [birth year]   DATE OF PROCEDURE:  02/06/2006  DATE OF DISCHARGE:                                 OPERATIVE REPORT   PROCEDURE:  Colonoscopy.   ENDOSCOPIST:  Shirley Friar, MD   INDICATION:  Heme-positive stool.   MEDICATIONS:  Fentanyl 100 mcg, Versed 10 mg, Phenergan 12.5 mg.   FINDINGS:  Rectal exam was normal.   DESCRIPTION OF PROCEDURE:  An adult adjustable colonoscope was inserted  through a fair-prepped colon and advanced to the cecum, where the ileocecal  valve and appendiceal orifice were identified.  The terminal ileum was  intubated; it was normal in appearance.  The procedure was complicated by  the amount of green thick semi-formed stool throughout the colon that had to  be repeatedly aspirated and irrigated to allow adequate visualization of the  colon.  On careful withdrawal, the colonoscope revealed no mucosal  abnormalities and no polyps; however, small polyps may not have been  visualized due to the patient's prep.  Retroflexion revealed small internal  hemorrhoids.  The colonoscope was withdrawn and confirmed the above  findings.   ASSESSMENT:  1.  Small internal hemorrhoids, otherwise normal colonoscopy.  2.  Normal terminal ileum.   PLAN:  1.  High-fiber diet.  2.  Repeat colonoscopy in 5 years.      Shirley Friar, MD  Electronically Signed     VCS/MEDQ  D:  02/06/2006  T:  02/07/2006  Job:  [id number]   cc:   Asencion Partridge, M.D.  Fax: [phone]

## 2011-05-04 NOTE — Op Note (Signed)
NAME:  [name], [name]                           ACCOUNT NO.:  [id number]   MEDICAL RECORD NO.:  [id number]                   PATIENT TYPE:  INP   LOCATION:  3106                                 FACILITY:  [facility]   PHYSICIAN:  Payton Doughty, M.D.                   DATE OF BIRTH:  07/30/1940   DATE OF PROCEDURE:  04/18/2004  DATE OF DISCHARGE:                                 OPERATIVE REPORT   PREOPERATIVE DIAGNOSIS:  Right occipital neuralgia and C1 fracture.   POSTOPERATIVE DIAGNOSIS:  Right occipital neuralgia and C1 fracture.   OPERATIVE PROCEDURE:  Exploration of the right C1 root and decompression of  the right C1 root.   SURGEON:  Payton Doughty, M.D.   SERVICE:  Neurosurgery   ANESTHESIA:  General endotracheal anesthesia.   PREPARATION:  Betadine and alcohol wipe.   COMPLICATIONS:  None.   ASSISTANT:  Nurse assistant [facility]  Doctor assistant Clydene Fake, M.D.   BODY OF TEXT:  This is a 70 year old right handed white gentleman who has a  C1 fracture in the past and has right occipital neuralgia associated with  this.  He is taken to the operating room, smoothly anesthetized, and  intubated, placed prone on the operating table in the [place] head holder.  Following shave, prep, and drape in the usual sterile fashion, the skin was  incised from the inion down to the spinous process of C2 and the occipital,  and the lamina of C1 and C2 were exposed on the right side.  Working  carefully out over the lamina of C1, the fracture site was identified and  immediately superolaterally to it was identified a neurovascular bundle  which contained the vertebral artery and a root emerging from under the C1  lamina.  The nerve was in the fracture site.  Working through the minimally  osseous union at the fracture site, the root was carefully decompressed from  its overlying bone.  It was very closely adherent to the vertebral artery  and this was also carefully decompressed.  The  Doppler was used to map the  vertebral artery.  Venous bleeding was controlled with lightly packed  thrombin soaked Gelfoam which was removed.  Following complete decompression  of the nerve, the wound was irrigated and hemostasis assured.  The fascia  was reapproximated with 0 Vicryl in an interrupted fashion, the subcutaneous  tissue were reapproximated with 0 Vicryl in an interrupted fashion, the  subcuticular tissues were reapproximated with 3-0 Vicryl in an interrupted  fashion and the skin was closed with 3-0 nylon in a running locked fashion.  Benzoin and Telfa dressing was applied made occlusive with OpSite and the  patient returned to the recovery room in good condition.  Payton Doughty, M.D.    MWR/MEDQ  D:  04/18/2004  T:  04/18/2004  Job:  [id number]

## 2011-05-04 NOTE — H&P (Signed)
NAMEMINNIE, SHI                 ACCOUNT NO.:  [id number]   MEDICAL RECORD NO.:  [id number]          PATIENT TYPE:  INP   LOCATION:  NA                           FACILITY:  [facility]   PHYSICIAN:  Payton Doughty, M.D.      DATE OF BIRTH:  07/30/1940   DATE OF ADMISSION:  05/18/2005  DATE OF DISCHARGE:                                HISTORY & PHYSICAL   ADMISSION DIAGNOSIS:  C1 fracture with developing C1-C2 instability.   DICTATING DOCTOR:  Dr. Channing Mutters.   SERVICE:  Neurosurgery.   DESCRIPTION OF PROCEDURE:  A 70 year old right-handed white gentleman, in  February of 2004 fractured C1 for the second time.  He developed pain in the  occiput and he had fallen at work and developed a C1 fracture.  C2 scanning  demonstrates a nonhealed C1 fracture on the right side.  A year ago, he  underwent exploration of the right greater occipital nerve and did well for  a while but has had progressive increase in his neck pain and films that  show 4 mm of subluxation of C1 on C2 as evidenced by opening of the  predental space and he is now admitted for an occiput to C4 fusion.   His medical history is remarkable for a rotator cuff injury.   MEDICATIONS:  1.  OxyContin 40 mg b.i.d.  2.  An anti-inflammatory.  3.  Baclofen 10 mg t.i.d.  4.  Percocet on a p.r.n. basis for intractable neck pain.   FAMILY HISTORY:  Mom died at 69 of cervical cancer.  Dad died at 30 of lung  cancer.   SOCIAL HISTORY:  He does not smoke and does not drink.  Works for the [employer].   ALLERGIES:  HE HAS NO ALLERGIES.   REVIEW OF SYSTEMS:  Remarkable for neck pain.   PHYSICAL EXAMINATION:  HEENT:  Examination is within normal limits.  He has  pain when he turns his head towards the right and left in the distribution  of the greater occipital nerve. He also has a lot of deep cervical pain with  any movement.  CHEST:  Clear.  CARDIAC:  A regular rate and rhythm.  ABDOMEN:  Nontender with no  hepatosplenomegaly.  EXTREMITIES:  Without clubbing or cyanosis.  GENITOURINARY:  Exam is deferred.  Peripheral pulses are good.  NEUROLOGICAL:  He is awake, alert and oriented.  Cranial nerves are intact.  He has a positive Tinel's over C1 posteriorly.  He also has a profound  Lhermitte's sign with flexion.  Motor exam shows 5/5 strength throughout the  upper and lower extremities.  There is no current sensory deficit.  Reflexes  are now 3 at the biceps, triceps, 2 at the knees, 1 at the ankle.  Toes are  downgoing.  Hoffman's is now positive.   Radiographic results have been reviewed as above.   CLINICAL IMPRESSION:  A Jefferson fracture with developing instability.  He  has early signs of myelopathy.   PLAN:  The plan is for occiput to C4 fusion.  The risks and  benefits of this  approach have been discussed with him and he wishes to proceed.      MWR/MEDQ  D:  05/18/2005  T:  05/18/2005  Job:  [id number]

## 2011-05-04 NOTE — Discharge Summary (Signed)
NAME:  [name], [name]                           ACCOUNT NO.:  [id number]   MEDICAL RECORD NO.:  [id number]                   PATIENT TYPE:  INP   LOCATION:  3038                                 FACILITY:  [facility]   PHYSICIAN:  Payton Doughty, M.D.                   DATE OF BIRTH:  07/30/1940   DATE OF ADMISSION:  04/18/2004  DATE OF DISCHARGE:  04/21/2004                                 DISCHARGE SUMMARY   ADMISSION DIAGNOSIS:  Occipital neuralgia.   DISCHARGE DIAGNOSIS:  Occipital neuralgia.   OPERATIVE PROCEDURE:  Right occipital nerve decompression.   HISTORY:  This is a 70 year old right handed white gentleman whose history  and physical is recounted in the chart. He had a C1 fracture several years  ago. It has healed with residual occipital neuralgia. He is admitted for  exploration of the occipital nerve.   PAST MEDICAL HISTORY:  Remarkable for rotator cuff injury.   MEDICATIONS:  1. OxyContin.  2. Baclofen.   PHYSICAL EXAMINATION:  GENERAL:  Intact.  NEUROLOGICAL:  Demonstrated occipital neuralgia.   HOSPITAL COURSE:  He was admitted after ascertaining normal laboratory  values, taken to the operating room and underwent exploration of the right  occipital nerve. He was found that the nerve was entrapped in the fracture  along side of the vertebral artery. It was dissected free and decompressed.  Following decompression, his occipital neuralgia was markedly improved.  Postoperatively, he was monitored in the ICU for one night. He was asked to  be transferred to the floor after the first night, but because of nursing  shortage, he was kept in the ICU one extra night. He was transferred to the  floor the next day. He has not had any cranial nerve deficits. He is awake,  alert, and oriented, and as noted above, his cranial nerves were intact.  Strength is full. He is eating and voiding normally. He will be discharged  home in the care of his family. His followup will be in the  [facility] office in about 10 days for sutures.                                                Payton Doughty, M.D.    MWR/MEDQ  D:  04/21/2004  T:  04/22/2004  Job:  [id number]

## 2011-05-04 NOTE — Discharge Summary (Signed)
NAMEALTA, SHOBER                 ACCOUNT NO.:  [id number]   MEDICAL RECORD NO.:  [id number]          PATIENT TYPE:  INP   LOCATION:  1608                         [facility]ACILITY:  [facility]   PHYSICIAN:  Ollen Gross, M.D.    DATE OF BIRTH:  02/08/1941   DATE OF ADMISSION:  05/09/2007  DATE OF DISCHARGE:  05/12/2007                               DISCHARGE SUMMARY   ADMITTING DIAGNOSES:  1. Osteoarthritis left knee.  2. Arthritis.  3. Carpal tunnel.  4. Hypercholesterolemia.  5. History of cervical fracture.  6. Depression.  7. Hypertension.  8. Chronic pain.   DISCHARGE DIAGNOSES:  1. Osteoarthritis left knee status post left total knee arthroplasty.  2. Postoperative hyponatremia.  3. Arthritis.  4. Carpal tunnel.  5. Hypercholesterolemia.  6. History of cervical fracture.  7. Depression.  8. Hypertension.  9. Chronic pain.   PROCEDURE:  May 09, 2007:  Left total knee.  Surgeon:  Dr. Lequita Halt.  Assistant:  Avel Peace, PA-C.  Spinal anesthesia.   CONSULTS:  None.   BRIEF HISTORY:  Rudell is a 70 year old male with severe end-stage  arthritis of the left knee, progressive worsening pain and dysfunction,  successful right total knee, now presents for left total knee.   LABORATORY DATA:  Preoperative CBC showed a hemoglobin of 13.6,  hematocrit of 40.8, white cell count 9.3.  Postoperative hemoglobin 12,  drifted down to 11.8, back up to 12.1 with a hematocrit of 35.5.  PT/PTT  preoperatively 13.5 and 35 respectively.  INR 1.0.  Serial pro times  followed.  Last noted PT/INR 333.7 and 3.1.  Chem panel on admission all  within normal limits.  Postoperative hyponatremia, dropped down to 135,  last noted at 132.  Preoperative UA negative.  Blood group/type A  positive.  EKG October 17, 2006:  Normal sinus rhythm, normal EKG.  No  significant change since last tracing.  Confirmed by Dr. Olga Millers.   HOSPITAL COURSE:  The patient admitted to [facility],  tolerated  procedure well, later transferred to recovery room and the  orthopedic floor.  Started on PCA and p.o. analgesic for pain control  following surgery.  Was doing pretty well on the morning of day #1.  Hemoglobin was stable.  Started getting up out of bed and encouraged  p.o. medications.  By day #2 he was doing a little bit better, seen in  rounds on the weekend.  Dressing was changed.  Incision looked good.  Hemoglobin was stable 11.8.  he was weaned off the PCA over to p.o.  medications, weaned off his O2.  From a therapy standpoint he started  getting up and walked about 140 feet.  He was doing actually very well,  progressed very well with his PT, and he was ready to go home by the  following day of May 12, 2007.   DISCHARGE PLAN:  1. The patient discharged home on May 12, 2007.  2. Discharge diagnoses:  Please see above.  3. Discharge medications:  Oxycodone and Coumadin.  Continue his home      medications.  4. Diet:  Resume home diet.  5. Activity:  Weightbearing as tolerated left lower extremity.  Home      health PT, home health nursing, total knee protocol.  6. Followup in 2 weeks.   DISPOSITION:  Home.   CONDITION UPON DISCHARGE:  Improved.      Alexzandrew L. Perkins, P.A.C.      Ollen Gross, M.D.  Electronically Signed    ALP/MEDQ  D:  06/13/2007  T:  06/13/2007  Job:  [id number]   cc:   Alanson Puls, M.D.

## 2011-05-04 NOTE — Consult Note (Signed)
[name], ESPIN NO.:  [id number]   MEDICAL RECORD NO.:  [id number]          PATIENT TYPE:  OBV   LOCATION:  2031                         FACILITY:  [facility]   PHYSICIAN:  Peter M. [place], M.D.  DATE OF BIRTH:  07/30/1940   DATE OF CONSULTATION:  12/05/2004  DATE OF DISCHARGE:                                   CONSULTATION   HISTORY OF PRESENT ILLNESS:  Mr. Ebron is a 70 year old white male with  history of hypertension, hypercholesterolemia who is admitted for evaluation  of chest pain.  Yesterday at noon he developed significant tightness in  his jaws and chest and there was some pain and fullness.  He had no  shortness of breath, nausea, vomiting, or diaphoresis.  He also complained  of jerking in his left arm and leg and became staggering with his gait.  He  presented to the emergency room and his symptoms have now abated but he  still has some chest tightness.  He has no prior cardiac history and  apparently had a negative stress test in 1991.   PAST MEDICAL HISTORY:  1.  Hyperlipidemia.  2.  Degenerative joint disease.  3.  Cervical stenosis.  4.  Hypertension.  5.  Obesity.  6.  Diverticulosis.  7.  Gastroesophageal reflux disease.  8.  History of occipital neuralgia.  9.  Patient is thought to have bilateral TMJ arthroscopy.   CURRENT MEDICATIONS:  1.  HCTZ 25 mg daily.  2.  Zocor 20 mg daily.  3.  Oxycodone 40 mg t.i.d.  4.  Colace 150 mg b.i.d.  5.  Aspirin daily.  6.  Nitroglycerin paste.   ALLERGIES:  He has no known allergies.   SOCIAL HISTORY:  Patient is a maintenance specialist.  He denies tobacco or  alcohol use.   FAMILY HISTORY:  Negative for coronary disease.   REVIEW OF SYSTEMS:  As noted in HPI.   PHYSICAL EXAMINATION:  GENERAL:  Patient is an obese white male in no  apparent distress.  VITAL SIGNS:  Blood pressure 134/70, pulse 60 and regular.  He is afebrile.  Respirations are 20.  Saturations are 96% on room air.  HEENT:  Unremarkable.  NECK:  He has no JVD, adenopathy, or bruits.  LUNGS:  Clear to auscultation, percussion.  CARDIAC:  Regular rate and rhythm with a grade 1/6 systolic murmur heard  best to the right upper sternal border.  There is no S3.  There is no chest  wall pain to palpation.  ABDOMEN:  Obese, soft, and nontender.  Bowel sounds are positive.  There are  no masses.  EXTREMITIES:  Without edema.  Pulses are 2+ and symmetric.  NEUROLOGIC:  Nonfocal.   LABORATORY DATA:  Chest x-ray showed peribronchial thickening, otherwise no  active disease.  Cranial CT was negative.  ECG on December 19 showed diffuse  T-wave flattening.  Repeat today is normal.  White count 16,100, hemoglobin  14.5, hematocrit 42.3, platelets 317,000.  Coags are normal.  Sodium 137,  potassium 4.7, chloride 101, CO2 32, BUN 21, creatinine 0.8, glucose  101.  CPK-MBs were 1.3 and then less than 1.0 on point of care.  Subsequent CPKs  are 48 with 1.6 MB, 324 with 13.6 MB and 331 with 11.7 MB.  Troponins were  less than 0.05 x2 and then 0.01, 0.02, 0.02.   IMPRESSION:  1.  Atypical chest pain.  2.  Nonspecific CK-MB elevation with normal troponins.  3.  Left arm and leg jerking and dysequilibrium, question seizure.  4.  Hypertension.  5.  Hypercholesterolemia.   PLAN:  Would recommend an Adenosine Cardiolite study this admission to rule  out significant coronary disease.  If negative, patient could be discharged  to home.  If positive, would need cardiac catheterization.       PMJ/MEDQ  D:  12/05/2004  T:  12/05/2004  Job:  [id number]   cc:   Asencion Partridge, M.D.  Fax: [phone]

## 2011-05-22 ENCOUNTER — Encounter: Payer: Medicare Other | Attending: Physical Medicine & Rehabilitation | Admitting: Physical Medicine & Rehabilitation

## 2011-05-22 DIAGNOSIS — M25519 Pain in unspecified shoulder: Secondary | ICD-10-CM | POA: Insufficient documentation

## 2011-05-22 DIAGNOSIS — Z9889 Other specified postprocedural states: Secondary | ICD-10-CM | POA: Insufficient documentation

## 2011-05-22 DIAGNOSIS — M542 Cervicalgia: Secondary | ICD-10-CM | POA: Insufficient documentation

## 2011-05-22 DIAGNOSIS — M47817 Spondylosis without myelopathy or radiculopathy, lumbosacral region: Secondary | ICD-10-CM | POA: Insufficient documentation

## 2011-05-22 DIAGNOSIS — F3289 Other specified depressive episodes: Secondary | ICD-10-CM | POA: Insufficient documentation

## 2011-05-22 DIAGNOSIS — M47812 Spondylosis without myelopathy or radiculopathy, cervical region: Secondary | ICD-10-CM

## 2011-05-22 DIAGNOSIS — M1710 Unilateral primary osteoarthritis, unspecified knee: Secondary | ICD-10-CM

## 2011-05-22 DIAGNOSIS — G894 Chronic pain syndrome: Secondary | ICD-10-CM | POA: Insufficient documentation

## 2011-05-22 DIAGNOSIS — M19019 Primary osteoarthritis, unspecified shoulder: Secondary | ICD-10-CM

## 2011-05-22 DIAGNOSIS — F329 Major depressive disorder, single episode, unspecified: Secondary | ICD-10-CM | POA: Insufficient documentation

## 2011-05-23 NOTE — Assessment & Plan Note (Signed)
[name] is back regarding multiple pain complaints.  He just gave [place] a urine sample today, has had problems passing urine on first few times he was here.  He still receives his OxyContin from Dr. Leanord Hawking.  In discussing his pattern of use, he generally uses the OxyContin 4 times a day but not always on a 6-hour schedule.  He is on an oxycodone for breakthrough pain.  Pain seems to be most prominent in his neck and shoulders.  He does have some pain in the back and his thighs as well.  REVIEW OF SYSTEMS:  Notable for the above.  Full 12-point review is in the written health history section of the chart.  SOCIAL HISTORY:  Unchanged.  PHYSICAL EXAMINATION:  VITAL SIGNS:  Blood pressure is 136/71, pulse 75, respiratory rate 18, and he is saturating 94% on room air. GENERAL:  The patient is pleasant. MUSCULOSKELETAL:  He remains rigid in both cervical and lumbar spine today.  He walks with a limp favoring the left leg, perhaps more than the right.  He lacks general flexibility and range of motion in low back and lower extremity musculature.  Shoulders are limited also. HEART:  Regular. CHEST:  Clear. ABDOMEN:  Soft and nontender.  ASSESSMENT: 1. Chronic pain syndrome related to multiple cervical surgeries,     lumbar spondylosis, multiple shoulder and knee surgeries as well. 2. History of depression.  PLAN: 1. I asked the patient while we were awaiting for his urine drug     screen to come back to stretch out his OxyContin to q.8 h.  Once     his urine drug screen returns and is consistent with his medication     schedule, we will look at starting him on a longer acting agent     perhaps Opana q.12 h schedule.  We discussed at length the     pathophysiology regarding his chronic pain and cycling of his pain     with the frequent use of his oxycodone and his OxyContin.  The     patient understood and was appreciative. 2. The patient was given Voltaren gel to use on his neck t.i.d. which     he has not attempted before. 3. We will stay with the Mobic and tizanidine as prescribed. 4. We will see him back here in about a month.     Ranelle Oyster, M.D. Electronically Signed    ZTS/MedQ D:  05/22/2011 13:11:33  T:  05/23/2011 00:40:03  Job #:  [id number]  cc:   Maxwell Caul, M.D. Fax: [phone]

## 2011-06-18 ENCOUNTER — Encounter: Payer: Medicare Other | Attending: Neurosurgery | Admitting: Neurosurgery

## 2011-06-19 ENCOUNTER — Encounter: Payer: Medicare Other | Admitting: Neurosurgery

## 2011-06-19 DIAGNOSIS — G8929 Other chronic pain: Secondary | ICD-10-CM | POA: Insufficient documentation

## 2011-06-19 DIAGNOSIS — IMO0002 Reserved for concepts with insufficient information to code with codable children: Secondary | ICD-10-CM

## 2011-06-19 DIAGNOSIS — F3289 Other specified depressive episodes: Secondary | ICD-10-CM | POA: Insufficient documentation

## 2011-06-19 DIAGNOSIS — M542 Cervicalgia: Secondary | ICD-10-CM | POA: Insufficient documentation

## 2011-06-19 DIAGNOSIS — F329 Major depressive disorder, single episode, unspecified: Secondary | ICD-10-CM | POA: Insufficient documentation

## 2011-06-19 NOTE — Assessment & Plan Note (Signed)
ACCOUNT:  [id number].  This patient is fairly new to clinic, saw Dr. Riley Kill last month for his multiple pain complaints.  The patient did complete a drug screen that was positive for his diazepam and OxyContin that he was on prior to coming here.  Dr. Riley Kill continued him on his OxyContin CR 60 mg one p.o. q.8 h. and oxycodone IR 10 mg one p.o. q.6 h. and the patient states he is doing well with that and does not want to change it at this time even though Dr. Riley Kill had mentioned about switching him to Opana q.12.  He rates his pain about 6.  It is the same as in his right knee and neck.  Rest, heat, and medication tend to help pain the same 24 hours a day.  Functionality, he is unemployed.  REVIEW OF SYSTEMS:  Notable for those difficulties as described above, otherwise unremarkable.  PAST MEDICAL HISTORY:  Unchanged since his first visit.  SOCIAL HISTORY:  Unchanged since his first visit.  FAMILY HISTORY:  Unchanged since his first visit.  PHYSICAL EXAM:  VITAL SIGNS:  His blood pressure is 153/76, pulse 56, respirations 18, O2 sats 91 on room air. NEUROLOGIC:  His motor strength is good in the lower extremities.  He is slightly weak in the upper extremities due to pain and he is very rigid with his cervical spine movement. CONSTITUTIONAL:  He is mildly obese.  He is oriented x3, but his speech is somewhat garbled.  He has almost an expressive aphasia at times today.  I am not sure if that is medicine related or if it is stress and depression with him.  ASSESSMENT: 1. Chronic pain, cervicalgia, status post cervical surgeries. 2. Depression.  PLAN: 1. We will continue his oxycodone IR 10 mg one p.o. q.6 h., #90 with     no refill. 2. OxyContin CR 60 mg one p.o. q.8 h., #90 with no refill. 3. He will continue his other medicines as prescribed.  He will follow     up with Dr. Riley Kill in 1 month.  His questions were encouraged and     answered.     Jamyron Redd L. Blima Dessert Electronically Signed    RLW/MedQ D:  06/18/2011 12:23:11  T:  06/19/2011 01:30:57  Job #:  [id number]

## 2011-07-16 ENCOUNTER — Encounter: Payer: Medicare Other | Attending: Neurosurgery | Admitting: Neurosurgery

## 2011-07-16 DIAGNOSIS — F329 Major depressive disorder, single episode, unspecified: Secondary | ICD-10-CM

## 2011-07-16 DIAGNOSIS — F3289 Other specified depressive episodes: Secondary | ICD-10-CM | POA: Insufficient documentation

## 2011-07-16 DIAGNOSIS — M542 Cervicalgia: Secondary | ICD-10-CM

## 2011-07-16 DIAGNOSIS — G8929 Other chronic pain: Secondary | ICD-10-CM | POA: Insufficient documentation

## 2011-07-16 DIAGNOSIS — G894 Chronic pain syndrome: Secondary | ICD-10-CM

## 2011-07-16 DIAGNOSIS — Z981 Arthrodesis status: Secondary | ICD-10-CM | POA: Insufficient documentation

## 2011-07-17 NOTE — Assessment & Plan Note (Signed)
ACCOUNT:  [id number].  HISTORY:  This is a patient of Dr.  Riley Kill has been seen for multiple pain complaints as well as cervicalgia with a history of several cervical fusions.  He also has knee pain, it is unchanged from previous. He rates his pain today at about 6, it is a stabbing type pain.  He did tell me about his original injury and he treated with Dr. Channing Mutters, and he may have to return to a neurosurgeon at some point, but it may not be Dr. Channing Mutters because he is now in [place].  Pain is worse during the day. Sleep patterns are fair.  Pain is relieved with rest.  He did not really indicate what aggravates the pain.  Mobility, he climb steps and drives. Functionality, he is retired.  REVIEW OF SYSTEMS:  Notable for difficulties described above, otherwise within normal limits.  PAST MEDICAL HISTORY:  Unchanged.  SOCIAL HISTORY:  He is divorced.  Lives alone.  FAMILY HISTORY:  Unchanged.  PHYSICAL EXAMINATION:  His blood pressure 122/78, pulse 59, respirations 16, O2 sats 92% on room air.  His motor strength is 5/5 in the upper extremities.  He has got good iliopsoas, quadriceps strength.  His sensation is intact.  Constitutionally, he is within normal limits. Alert and oriented x3.  He does have a cane with him for ambulation. His Oswestry score is 20.  ASSESSMENT: 1. Chronic pain, cervicalgia status post several surgeries in cervical     spine. 2. Depression.  PLAN: 1. Refill oxycodone IR 10 mg one p.o. q.6 h p.r.n., 90 with no refill. 2. OxyContin CR 60 mg one p.o. q.8 h, 90 with no refill. 3. He will continue on his other medicines as prescribed and he will     follow up with Dr. Riley Kill in 1 month.     Aceyn Kathol L. Blima Dessert Electronically Signed    RLW/MedQ D:  07/16/2011 14:09:23  T:  07/17/2011 00:22:47  Job #:  [id number]

## 2011-08-13 ENCOUNTER — Encounter: Payer: Medicare Other | Attending: Physical Medicine & Rehabilitation | Admitting: Physical Medicine & Rehabilitation

## 2011-08-13 DIAGNOSIS — F3289 Other specified depressive episodes: Secondary | ICD-10-CM | POA: Insufficient documentation

## 2011-08-13 DIAGNOSIS — M47812 Spondylosis without myelopathy or radiculopathy, cervical region: Secondary | ICD-10-CM

## 2011-08-13 DIAGNOSIS — M47817 Spondylosis without myelopathy or radiculopathy, lumbosacral region: Secondary | ICD-10-CM

## 2011-08-13 DIAGNOSIS — IMO0001 Reserved for inherently not codable concepts without codable children: Secondary | ICD-10-CM

## 2011-08-13 DIAGNOSIS — M542 Cervicalgia: Secondary | ICD-10-CM | POA: Insufficient documentation

## 2011-08-13 DIAGNOSIS — F329 Major depressive disorder, single episode, unspecified: Secondary | ICD-10-CM | POA: Insufficient documentation

## 2011-08-13 DIAGNOSIS — M1710 Unilateral primary osteoarthritis, unspecified knee: Secondary | ICD-10-CM

## 2011-08-13 DIAGNOSIS — G8929 Other chronic pain: Secondary | ICD-10-CM | POA: Insufficient documentation

## 2011-08-13 NOTE — Assessment & Plan Note (Signed)
[name] is back regarding his multiple pain complaints.  His biggest problem today is his neck which seems to have flared up.  He was at his brother's funeral and was helping there apparently and strained the neck.  The right side of the neck has been very painful, shooting up into the head.  It has been about 3 weeks.  He has not noticed a significant improvement.  He continues on his other meds we have prescribed.  REVIEW OF SYSTEMS:  Notable for the above.  He still has some spasms in the low back as well as the neck.  A full 12-point review is in the written health and history section.  SOCIAL HISTORY:  Unchanged.  He lives alone.  PHYSICAL EXAMINATION:  VITAL SIGNS:  Blood pressure is 115/49, pulse 51, respiratory rate 16, satting 94% on room air. GENERAL:  The patient is pleasant. MUSCULOSKELETAL:  The patient sits rigidly and has limited range of motion in the cervical spine in all planes.  He had tenderness and spasm in the mid trap as well as the border of the splenius capitis muscles. These areas have been pressed, and reproduced his pain.  Strength in upper extremities near 4 to 5 out of 5 as well as the lower extremities today.  ASSESSMENT: 1. Chronic pain, cervicalgia status post multiple cervical surgeries.     The patient with current myofascial pain on the right side. 2. Depression.  PLAN: 1. After informed consent, I injected the trapezius and splenius     capitis each with 2 mL of 1% lidocaine.  The patient tolerated it     well. 2.  Refill OxyContin 60 mg q.8 hours and oxycodone IR 10 mg     q.6 hours p.r.n. #90 respectively. 2. Discussed heat, ice, appropriate modalities including massage.  He     uses Zanaflex for muscle spasms. 3. He will see nurse practitioner in 1 month.     Ranelle Oyster, M.D. Electronically Signed    ZTS/MedQ D:  08/13/2011 13:49:08  T:  08/13/2011 21:25:20  Job #:  [id number]

## 2011-09-07 LAB — I-STAT 8, (EC8 V) (CONVERTED LAB)
Acid-Base Excess: 6 — ABNORMAL HIGH
Glucose, Bld: 92
TCO2: 32
pCO2, Ven: 45.3
pH, Ven: 7.442 — ABNORMAL HIGH

## 2011-09-07 LAB — POCT HEMOGLOBIN-HEMACUE: Hemoglobin: 13.2

## 2011-09-10 LAB — I-STAT 8, (EC8 V) (CONVERTED LAB)
BUN: 23
Glucose, Bld: 77
HCT: 43
Hemoglobin: 14.6
Operator id: 257131
Potassium: 3.8
Sodium: 137
TCO2: 30

## 2011-09-10 LAB — BASIC METABOLIC PANEL
CO2: 31
Calcium: 8.7
Chloride: 105
GFR calc Af Amer: >60
Glucose, Bld: 97
Sodium: 141

## 2011-09-10 LAB — CBC
HCT: 33.1 — ABNORMAL LOW
Hemoglobin: 11.1 — ABNORMAL LOW
MCHC: 33.7
MCV: 89.1
RBC: 3.71 — ABNORMAL LOW
RDW: 14.2

## 2011-09-10 LAB — ALDOLASE: Aldolase: 7.8 U/L (ref <=8.1)

## 2011-09-11 ENCOUNTER — Encounter: Payer: Medicare Other | Attending: Neurosurgery | Admitting: Neurosurgery

## 2011-09-11 DIAGNOSIS — M961 Postlaminectomy syndrome, not elsewhere classified: Secondary | ICD-10-CM

## 2011-09-11 DIAGNOSIS — F329 Major depressive disorder, single episode, unspecified: Secondary | ICD-10-CM | POA: Insufficient documentation

## 2011-09-11 DIAGNOSIS — F3289 Other specified depressive episodes: Secondary | ICD-10-CM | POA: Insufficient documentation

## 2011-09-11 DIAGNOSIS — M542 Cervicalgia: Secondary | ICD-10-CM | POA: Insufficient documentation

## 2011-09-11 DIAGNOSIS — G8928 Other chronic postprocedural pain: Secondary | ICD-10-CM

## 2011-09-11 LAB — URINALYSIS, ROUTINE W REFLEX MICROSCOPIC
Bilirubin Urine: NEGATIVE
Glucose, UA: NEGATIVE
Glucose, UA: NEGATIVE
Ketones, ur: NEGATIVE
Ketones, ur: NEGATIVE
Ketones, ur: NEGATIVE
Ketones, ur: NEGATIVE
Nitrite: NEGATIVE
Nitrite: NEGATIVE
Protein, ur: NEGATIVE
Protein, ur: NEGATIVE
Protein, ur: NEGATIVE
Protein, ur: NEGATIVE
Urobilinogen, UA: 0.2
Urobilinogen, UA: 1
pH: 7.5

## 2011-09-11 LAB — COMPREHENSIVE METABOLIC PANEL
ALT: 18
ALT: 86 — ABNORMAL HIGH
ALT: 95 — ABNORMAL HIGH
AST: 20
AST: 32
Albumin: 4
Albumin: 2.9 — ABNORMAL LOW
Alkaline Phosphatase: 90
Alkaline Phosphatase: 63
Alkaline Phosphatase: 63
BUN: 18
BUN: 17
BUN: 19
CO2: 29
CO2: 29
CO2: 30
CO2: 30
Calcium: 9.7
Calcium: 9
Chloride: 95 — ABNORMAL LOW
Chloride: 96
Chloride: 97
Creatinine, Ser: 0.72
Creatinine, Ser: 0.74
GFR calc Af Amer: >60
GFR calc Af Amer: >60
GFR calc non Af Amer: >60
GFR calc non Af Amer: >60
GFR calc non Af Amer: >60
GFR calc non Af Amer: >60
Glucose, Bld: 90
Glucose, Bld: 99
Potassium: 4
Potassium: 4.1
Potassium: 4.9
Sodium: 137
Sodium: 135
Sodium: 136
Total Bilirubin: 0.6
Total Bilirubin: 0.4
Total Bilirubin: 0.7
Total Protein: 7.1
Total Protein: 7.9
Total Protein: 5.6 — ABNORMAL LOW
Total Protein: 5.9 — ABNORMAL LOW

## 2011-09-11 LAB — CBC
HCT: 39.5
HCT: 34.2 — ABNORMAL LOW
HCT: 35.3 — ABNORMAL LOW
Hemoglobin: 13.3
Hemoglobin: 11.7 — ABNORMAL LOW
MCHC: 33.6
MCHC: 33.7
MCHC: 33.8
MCHC: 34.2
MCV: 87.8
MCV: 88.2
MCV: 87.8
Platelets: 310
Platelets: 328
Platelets: 346
RBC: 4.5
RBC: 4.54
RBC: 3.85 — ABNORMAL LOW
RBC: 3.94 — ABNORMAL LOW
RDW: 14.3
RDW: 14.4
RDW: 15.1
WBC: 8.9
WBC: 11.1 — ABNORMAL HIGH
WBC: 15.4 — ABNORMAL HIGH

## 2011-09-11 LAB — BASIC METABOLIC PANEL
BUN: 18
BUN: 14
Chloride: 103
Chloride: 98
GFR calc Af Amer: >60
GFR calc non Af Amer: >60
Glucose, Bld: 123 — ABNORMAL HIGH
Potassium: 3.9
Potassium: 4.1
Sodium: 137

## 2011-09-11 LAB — COMPREHENSIVE METABOLIC PANEL WITH GFR
ALT: 18
AST: 21
Albumin: 3.7
Calcium: 9.6
Creatinine, Ser: 0.72
GFR calc Af Amer: >60
Sodium: 133 — ABNORMAL LOW

## 2011-09-11 LAB — DIFFERENTIAL
Basophils Absolute: 0.1
Basophils Absolute: 0
Basophils Absolute: 0.2 — ABNORMAL HIGH
Basophils Relative: 0
Basophils Relative: 1
Basophils Relative: 0
Basophils Relative: 0
Eosinophils Absolute: 0.3
Eosinophils Absolute: 0
Eosinophils Absolute: 0
Eosinophils Relative: 3
Eosinophils Relative: 0
Lymphocytes Relative: 16
Lymphocytes Relative: 12
Lymphs Abs: 1.5
Lymphs Abs: 1.1
Monocytes Absolute: 0.7
Monocytes Absolute: 0.9
Monocytes Absolute: 0.6
Monocytes Relative: 10
Monocytes Relative: 9
Monocytes Relative: 5
Monocytes Relative: 6
Neutro Abs: 5.1
Neutro Abs: 6.2
Neutro Abs: 13.5 — ABNORMAL HIGH
Neutro Abs: 8.6 — ABNORMAL HIGH
Neutrophils Relative %: 70
Neutrophils Relative %: 88 — ABNORMAL HIGH

## 2011-09-11 LAB — URINE MICROSCOPIC-ADD ON

## 2011-09-11 LAB — URINE CULTURE
Colony Count: >=100000
Colony Count: NO GROWTH
Culture: NO GROWTH
Culture: NO GROWTH
Special Requests: NEGATIVE

## 2011-09-11 LAB — TYPE AND SCREEN
ABO/RH(D): A POS
Antibody Screen: NEGATIVE

## 2011-09-11 LAB — APTT: aPTT: 36

## 2011-09-11 LAB — LIPASE, BLOOD: Lipase: 14

## 2011-09-11 NOTE — Assessment & Plan Note (Signed)
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  His neck pain is unchanged.  He has got a history of several cervical surgeries that are resolved.  Limited range of motion and chronic pain.  He rates the pain is 9.  Sharp pain with aching sensation.  General activity level is 8 or 9.  Pain is worse during the morning and during the day.  Sleep patterns are not indicated.  Pain is worse with walking and sitting.  Medication tends to help.  He uses a cane for ambulation.  Can climb steps and driving.  Walk about 15 minutes at a time.  He is retired.  REVIEW OF SYSTEMS:  Notable for difficulties described above as well as some numbness, poor appetite, no signs of aberrant behavior, no suicidal thoughts.  Last UDS was consistent.  Pill counts correct.  PAST MEDICAL HISTORY:  Unchanged.  SOCIAL HISTORY:  Unchanged.  FAMILY HISTORY:  Unchanged.  PHYSICAL EXAMINATION:  His blood pressure is 164/89, pulse 45, respirations 18, O2 sats 94% on room air.  His motor strength is somewhat diminished in the upper and lower extremities.  4/5 in upper and lower extremities.  He gives away to pain.  Neurologic deficit.  His sensation is somewhat diminished in the upper extremity.  He has very limited range of motion with cervical spine.  Constitutionally, he is within normal limits.  He is alert and oriented x3.  Appear somewhat depressed.  He does walk with an unsteady gait.  ASSESSMENT: 1. Chronic pain cervicalgia spells post several cervical surgeries. 2. Depression.  PLAN: 1. Refill oxycodone IR 10 mg one q.6 h. p.r.n., 90 with no refill. 2. Pravastatin 60 mg one p.o. q.8 h. #90 with no refill. 3. Questions encouraged and answered.  We will see him back in a     month.     Ashaad Gaertner L. Blima Dessert Electronically Signed    RLW/MedQ D:  09/11/2011 13:11:51  T:  09/11/2011 14:09:10  Job #:  [id number]

## 2011-09-28 LAB — PROTIME-INR
INR: 1
Prothrombin Time: 13.7

## 2011-09-28 LAB — DIFFERENTIAL
Basophils Absolute: 0
Basophils Relative: 1
Neutro Abs: 5.3
Neutrophils Relative %: 66

## 2011-09-28 LAB — COMPREHENSIVE METABOLIC PANEL
ALT: 17
CO2: 30
Calcium: 10.2
Chloride: 101
Creatinine, Ser: 0.78
GFR calc non Af Amer: >60
Glucose, Bld: 81
Sodium: 138
Total Bilirubin: 0.6

## 2011-09-28 LAB — CBC
Hemoglobin: 13.5
MCHC: 34
MCV: 87.6
RBC: 4.53

## 2011-09-28 LAB — URINALYSIS, ROUTINE W REFLEX MICROSCOPIC
Bilirubin Urine: NEGATIVE
Glucose, UA: NEGATIVE
Hgb urine dipstick: NEGATIVE
Ketones, ur: 15 — AB
pH: 7

## 2011-09-28 LAB — APTT: aPTT: 41 — ABNORMAL HIGH

## 2011-10-23 ENCOUNTER — Encounter: Payer: Medicare Other | Attending: Neurosurgery | Admitting: Neurosurgery

## 2011-10-23 DIAGNOSIS — F329 Major depressive disorder, single episode, unspecified: Secondary | ICD-10-CM | POA: Insufficient documentation

## 2011-10-23 DIAGNOSIS — M25569 Pain in unspecified knee: Secondary | ICD-10-CM | POA: Insufficient documentation

## 2011-10-23 DIAGNOSIS — M542 Cervicalgia: Secondary | ICD-10-CM

## 2011-10-23 DIAGNOSIS — G8928 Other chronic postprocedural pain: Secondary | ICD-10-CM | POA: Insufficient documentation

## 2011-10-23 DIAGNOSIS — F3289 Other specified depressive episodes: Secondary | ICD-10-CM | POA: Insufficient documentation

## 2011-10-23 NOTE — Assessment & Plan Note (Signed)
Account [id number].  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 He has had multiple neck surgeries and is still having cervicalgia pain from that.  The patient rates his pain at an 8.  It is a burning to dull type pain.  His activity level is 4-5.  He does not indicate when the pain is worse, what worsens, or helps.  Mobility-wise, he uses a cane to ambulate.  Does not drive or climb steps.  Functionally, he is not working.  REVIEW OF SYSTEMS:  Notable for the difficulties described above as well as some limb swelling, poor appetite, otherwise within normal limits.  PAST MEDICAL HISTORY/SOCIAL HISTORY/FAMILY HISTORY:  Unchanged.  PHYSICAL EXAMINATION:  VITAL SIGNS:  His blood pressure is 183/82.  He is encouraged to see his primary care doctor, pulse 47, respirations 12, O2 sats 97.  His motor strength and sensation are somewhat diminished.  Constitutionally, he is disheveled.  He is alert and oriented x3.  His gait is with a limp.  ASSESSMENT: 1. Chronic cervicalgia and post cervical surgery. 2. Depression.  PLAN: 1. Refill Mobic 7.5, one p.o. q.a.m. with food, 30 with 5 refills. 2. OxyContin 60 mg CR 1 p.o. q.8 h., 90, with no refill. 3. Oxycodone IR 10 mg 1 p.o. q.6 h. p.r.n., 90 with no refill.  His questions were encouraged and answered.  We will see him back here in a month.     [name] L. Blima Dessert Electronically Signed    RLW/MedQ D:  10/23/2011 14:17:55  T:  10/23/2011 20:59:46  Job #:  [id number]

## 2011-11-20 ENCOUNTER — Encounter: Payer: Medicare Other | Attending: Neurosurgery | Admitting: Neurosurgery

## 2011-11-20 DIAGNOSIS — M542 Cervicalgia: Secondary | ICD-10-CM

## 2011-11-20 DIAGNOSIS — M25569 Pain in unspecified knee: Secondary | ICD-10-CM | POA: Insufficient documentation

## 2011-11-20 DIAGNOSIS — R6884 Jaw pain: Secondary | ICD-10-CM | POA: Insufficient documentation

## 2011-11-20 DIAGNOSIS — F3289 Other specified depressive episodes: Secondary | ICD-10-CM | POA: Insufficient documentation

## 2011-11-20 DIAGNOSIS — F329 Major depressive disorder, single episode, unspecified: Secondary | ICD-10-CM | POA: Insufficient documentation

## 2011-11-20 DIAGNOSIS — G8929 Other chronic pain: Secondary | ICD-10-CM | POA: Insufficient documentation

## 2011-11-21 NOTE — Assessment & Plan Note (Signed)
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 Hermelinda Medicus, seen for chronic knee and neck pain. He does state that he has some jaw pain on the right and his primary care is instructed him to see a dentist about that and he is going to make that appointment.  He rates his average pain at 8.  It is a sharp tingling pain.  General activity level is 7.  Pain is worse in the morning.  Sleep patterns are poor.  Walking aggravates.  Medication tends to help.  He uses a cane for assistance.  He climb steps and drives.  Functionally, he is unemployed.  REVIEW OF SYSTEMS:  Notable for difficulties as described above as well as some abdominal pain.  No suicidal thoughts or aberrant behaviors. Last pill count, UDS collected today.  PAST MEDICAL HISTORY:  Unchanged.  SOCIAL HISTORY:  Unchanged.  FAMILY HISTORY:  Unchanged.  PHYSICAL EXAM:  VITAL SIGNS:  His blood pressure is 106/44, pulse 60, respirations 16, O2 sats 93 on room air. NEUROLOGIC:  His motor strength, sensation are intact in upper and lower extremities.  He does move with very stiff cervical spine.  Movement, he has a slight limp to his gait.  ASSESSMENT: 1. Chronic cervicalgia with post cervical surgery. 2. Depression.  PLAN: 1. Refill OxyContin 60 mg CR 1 p.o. q.8 hours, #90 with no refill. 2. Oxycodone IR 10 mg 1 p.o. q.6 hours p.r.n., #90 with no refills.     His questions were encouraged and answered.  We will see him in a     month.     Cybele Maule L. Blima Dessert Electronically Signed    RLW/MedQ D:  11/20/2011 11:01:34  T:  11/21/2011 01:18:34  Job #:  [id number]

## 2011-12-21 ENCOUNTER — Encounter: Payer: Medicare Other | Attending: Neurosurgery | Admitting: Neurosurgery

## 2011-12-21 DIAGNOSIS — R259 Unspecified abnormal involuntary movements: Secondary | ICD-10-CM | POA: Insufficient documentation

## 2011-12-21 DIAGNOSIS — M542 Cervicalgia: Secondary | ICD-10-CM | POA: Insufficient documentation

## 2011-12-21 DIAGNOSIS — M47817 Spondylosis without myelopathy or radiculopathy, lumbosacral region: Secondary | ICD-10-CM | POA: Diagnosis not present

## 2011-12-21 DIAGNOSIS — F329 Major depressive disorder, single episode, unspecified: Secondary | ICD-10-CM | POA: Diagnosis not present

## 2011-12-21 DIAGNOSIS — G8929 Other chronic pain: Secondary | ICD-10-CM | POA: Insufficient documentation

## 2011-12-21 DIAGNOSIS — G894 Chronic pain syndrome: Secondary | ICD-10-CM | POA: Diagnosis not present

## 2011-12-21 DIAGNOSIS — M1710 Unilateral primary osteoarthritis, unspecified knee: Secondary | ICD-10-CM | POA: Diagnosis not present

## 2011-12-21 DIAGNOSIS — M79609 Pain in unspecified limb: Secondary | ICD-10-CM | POA: Diagnosis not present

## 2011-12-21 DIAGNOSIS — M47812 Spondylosis without myelopathy or radiculopathy, cervical region: Secondary | ICD-10-CM | POA: Diagnosis not present

## 2011-12-21 DIAGNOSIS — F3289 Other specified depressive episodes: Secondary | ICD-10-CM | POA: Insufficient documentation

## 2011-12-21 DIAGNOSIS — Z79899 Other long term (current) drug therapy: Secondary | ICD-10-CM | POA: Diagnosis not present

## 2011-12-21 DIAGNOSIS — Z5181 Encounter for therapeutic drug level monitoring: Secondary | ICD-10-CM | POA: Diagnosis not present

## 2011-12-22 NOTE — Assessment & Plan Note (Signed)
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 Riley Kill, seen for chronic neck and knee pain.  He reports no change in his pain it is 7, it is a tingling, aching type pain.  He does report his activity level, what worsens pain; rest and medication helps.  He walks independently.  He does climb steps and drive.  Functionally he is unemployed.  REVIEW OF SYSTEMS:  Notable for difficulties as described above as well as some loss of taste or smell, sensory and depression.  No suicidal thoughts or aberrant behaviors.  Last pill count, UDS consistent.  Past medical history, social history, and family history unchanged.  PHYSICAL EXAMINATION:  VITAL SIGNS:  His blood pressure 154/79, pulse 62, respirations 16, O2 sats 98 on room air. MUSCULOSKELETAL:  His motor strength, sensation intact in the upper and lower extremities.  He is very stiff with his neck movement. NEUROLOGIC:  He is alert and oriented x3.  He has a slight limp.  ASSESSMENT: 1. Chronic cervicalgia, post cervical surgeries. 2. Depression.  PLAN: 1. Refill oxycodone 10 mg 1 p.o. q.6 hours p.r.n. 90 with no refill. 2. OxyContin 60 mg 1 p.o. q.8 hours 90 with no refill.  His questions     were encouraged and answered.  We will see him in a month.     Ludean Duhart L. Blima Dessert Electronically Signed    RLW/MedQ D:  12/21/2011 13:28:40  T:  12/22/2011 05:20:46  Job #:  [id number]

## 2012-01-08 DIAGNOSIS — M25519 Pain in unspecified shoulder: Secondary | ICD-10-CM | POA: Diagnosis not present

## 2012-01-08 DIAGNOSIS — M12519 Traumatic arthropathy, unspecified shoulder: Secondary | ICD-10-CM | POA: Diagnosis not present

## 2012-01-14 ENCOUNTER — Encounter: Payer: Medicare Other | Attending: Physical Medicine & Rehabilitation | Admitting: Physical Medicine & Rehabilitation

## 2012-01-14 DIAGNOSIS — R259 Unspecified abnormal involuntary movements: Secondary | ICD-10-CM | POA: Insufficient documentation

## 2012-01-14 DIAGNOSIS — G8929 Other chronic pain: Secondary | ICD-10-CM | POA: Diagnosis not present

## 2012-01-14 DIAGNOSIS — G894 Chronic pain syndrome: Secondary | ICD-10-CM

## 2012-01-14 DIAGNOSIS — F329 Major depressive disorder, single episode, unspecified: Secondary | ICD-10-CM | POA: Diagnosis not present

## 2012-01-14 DIAGNOSIS — M1710 Unilateral primary osteoarthritis, unspecified knee: Secondary | ICD-10-CM

## 2012-01-14 DIAGNOSIS — M79609 Pain in unspecified limb: Secondary | ICD-10-CM | POA: Diagnosis not present

## 2012-01-14 DIAGNOSIS — R51 Headache: Secondary | ICD-10-CM | POA: Insufficient documentation

## 2012-01-14 DIAGNOSIS — M542 Cervicalgia: Secondary | ICD-10-CM | POA: Insufficient documentation

## 2012-01-14 DIAGNOSIS — M47817 Spondylosis without myelopathy or radiculopathy, lumbosacral region: Secondary | ICD-10-CM

## 2012-01-14 DIAGNOSIS — F3289 Other specified depressive episodes: Secondary | ICD-10-CM | POA: Insufficient documentation

## 2012-01-14 DIAGNOSIS — M47812 Spondylosis without myelopathy or radiculopathy, cervical region: Secondary | ICD-10-CM

## 2012-01-14 NOTE — Assessment & Plan Note (Signed)
[name] is back regarding his multiple pain complaints.  He states over the last 4 months, he feels that he has lost lot of his range of motion, mobility.  He complains of some knots or cramping in the right thigh posteriorly.  Complains of stinging and burning over the head.  He states he used to walk tried to stay active in the yard and is unable to do that currently.  His pain is about 9/10.  Pain is described as stabbing and tingling.  Sleep is poor.  Pain is worse with walking and standing right now.  REVIEW OF SYSTEMS:  Notable for the above.  Full 12-point review is in the written health and history section of the chart.  SOCIAL HISTORY:  The patient lives alone.  He stopped smoking in 1985.  PHYSICAL EXAMINATION:  VITAL SIGNS:  Blood pressure is 143/77, pulse 63, respiratory rate 18, he is satting 94% on room air. GENERAL:  The patient is pleasant, fairly alert.  Neck remains rigid. He tends to lean towards the right in his resting posture. MUSCULOSKELETAL:  I tried the range of the neck today and he is really limited in all planes with significant scar tissue noted posteriorly. Strength in the upper extremities remains 4/5 grossly with pain inhibition perhaps at the right shoulder more than left.  He does move his neck by really turning his torso and rotating at the shoulders. Lower extremity strength is grossly 5/5.  He had some mild hamstring tightness bilaterally.  Reflexes are 1+.  Sensory exam is grossly intact except for distally at the toes. NEUROLOGIC:  Cognitively, he is generally appropriate, although he does tend to wander from topic to topic on exam today.  ASSESSMENT: 1. Chronic cervicalgia status post multiple cervical spine surgeries.     Likely scar tissue component with radiculopathy as well.  They have     myofascial components also. 2. Right leg pain and spasms.  I think this is out of deconditioning     and loss of his flexibility.  The patient has had low  back problems     in the past as well. 3. Depression.  PLAN: 1. Initiate Topamax 25 mg at bedtime for cervicalgia and headaches.     Increase to 50 mg after 5 days. 2. Consider physical therapy to work on cervical and lumbar range of     motion as well as lower extremity stretching.  I discussed     hamstring stretching with the patient today. 3. Refilled OxyContin 60 mg q.8 hours #90 and oxycodone IR 10 mg, #90. 4. I will see the patient back in 2 months.  He will see nursing in 1     month.     Ranelle Oyster, M.D. Electronically Signed    ZTS/MedQ D:  01/14/2012 11:08:27  T:  01/14/2012 19:54:25  Job #:  [id number]

## 2012-01-18 ENCOUNTER — Ambulatory Visit: Payer: Medicare Other | Admitting: Neurosurgery

## 2012-01-21 DIAGNOSIS — J312 Chronic pharyngitis: Secondary | ICD-10-CM | POA: Diagnosis not present

## 2012-01-21 DIAGNOSIS — R221 Localized swelling, mass and lump, neck: Secondary | ICD-10-CM | POA: Diagnosis not present

## 2012-01-21 DIAGNOSIS — R220 Localized swelling, mass and lump, head: Secondary | ICD-10-CM | POA: Diagnosis not present

## 2012-01-24 DIAGNOSIS — R490 Dysphonia: Secondary | ICD-10-CM | POA: Diagnosis not present

## 2012-02-05 ENCOUNTER — Other Ambulatory Visit: Payer: Self-pay | Admitting: Physical Medicine & Rehabilitation

## 2012-02-05 ENCOUNTER — Inpatient Hospital Stay: Admission: RE | Admit: 2012-02-05 | Payer: Medicare Other | Source: Ambulatory Visit

## 2012-02-05 DIAGNOSIS — M545 Low back pain: Secondary | ICD-10-CM

## 2012-02-06 ENCOUNTER — Other Ambulatory Visit: Payer: Medicare Other

## 2012-02-07 DIAGNOSIS — H26499 Other secondary cataract, unspecified eye: Secondary | ICD-10-CM | POA: Diagnosis not present

## 2012-02-07 DIAGNOSIS — Z961 Presence of intraocular lens: Secondary | ICD-10-CM | POA: Diagnosis not present

## 2012-02-07 DIAGNOSIS — H40019 Open angle with borderline findings, low risk, unspecified eye: Secondary | ICD-10-CM | POA: Diagnosis not present

## 2012-02-07 DIAGNOSIS — H1045 Other chronic allergic conjunctivitis: Secondary | ICD-10-CM | POA: Diagnosis not present

## 2012-02-11 ENCOUNTER — Telehealth: Payer: Self-pay | Admitting: Physical Medicine & Rehabilitation

## 2012-02-11 DIAGNOSIS — M47812 Spondylosis without myelopathy or radiculopathy, cervical region: Secondary | ICD-10-CM

## 2012-02-11 NOTE — Telephone Encounter (Signed)
Message copied by Marsa Aris on Mon Feb 11, 2012 11:53 AM ------      Message from: Delaine Lame      Created: Fri Feb 08, 2012 10:15 AM      Regarding: RE: MRI order       Dr. Riley Kill, Can you please place an order for this MRI?  We will get it scheduled.  Thanks.            ----- Message -----         From: Charleen Kirks, RN         Sent: 02/08/2012  10:06 AM           To: April Estill Batten      Subject: MRI order                                                Per Dr Riley Kill, order MRI of C-spine w/o contrast Dx 721.0, worsening of neck and head pain. This needs done before he can be referred to Dr Channing Mutters, pt aware he needs to have this done first.

## 2012-02-13 ENCOUNTER — Encounter: Payer: Self-pay | Admitting: *Deleted

## 2012-02-13 ENCOUNTER — Encounter: Payer: Medicare Other | Attending: Physical Medicine & Rehabilitation | Admitting: *Deleted

## 2012-02-13 ENCOUNTER — Other Ambulatory Visit: Payer: Self-pay | Admitting: Internal Medicine

## 2012-02-13 VITALS — BP 157/63 | HR 70 | Resp 18 | Ht 66.0 in | Wt 225.0 lb

## 2012-02-13 DIAGNOSIS — M542 Cervicalgia: Secondary | ICD-10-CM | POA: Insufficient documentation

## 2012-02-13 DIAGNOSIS — F329 Major depressive disorder, single episode, unspecified: Secondary | ICD-10-CM | POA: Diagnosis not present

## 2012-02-13 DIAGNOSIS — M79609 Pain in unspecified limb: Secondary | ICD-10-CM | POA: Insufficient documentation

## 2012-02-13 DIAGNOSIS — R259 Unspecified abnormal involuntary movements: Secondary | ICD-10-CM | POA: Diagnosis not present

## 2012-02-13 DIAGNOSIS — M47817 Spondylosis without myelopathy or radiculopathy, lumbosacral region: Secondary | ICD-10-CM | POA: Diagnosis not present

## 2012-02-13 DIAGNOSIS — G8929 Other chronic pain: Secondary | ICD-10-CM | POA: Insufficient documentation

## 2012-02-13 DIAGNOSIS — F3289 Other specified depressive episodes: Secondary | ICD-10-CM | POA: Insufficient documentation

## 2012-02-13 DIAGNOSIS — Z9889 Other specified postprocedural states: Secondary | ICD-10-CM | POA: Diagnosis not present

## 2012-02-13 DIAGNOSIS — G894 Chronic pain syndrome: Secondary | ICD-10-CM

## 2012-02-13 DIAGNOSIS — M47812 Spondylosis without myelopathy or radiculopathy, cervical region: Secondary | ICD-10-CM

## 2012-02-13 MED ORDER — OXYCODONE HCL 5 MG PO CAPS: 10.0000 mg | ORAL_CAPSULE | Freq: Four times a day (QID) | ORAL | Status: DC | PRN

## 2012-02-13 MED ORDER — DICLOFENAC SODIUM 1 % TD GEL: 1.0000 "application " | Freq: Three times a day (TID) | TRANSDERMAL | Status: DC

## 2012-02-13 MED ORDER — OXYCODONE HCL 30 MG PO TB12: 60.0000 mg | ORAL_TABLET | Freq: Three times a day (TID) | ORAL | Status: DC

## 2012-02-13 NOTE — Progress Notes (Signed)
Awaiting scheduling of C-spine MRI so he can have consult w/ Dr Channing Mutters about his increased neck and head pain.

## 2012-02-14 ENCOUNTER — Other Ambulatory Visit: Payer: Self-pay | Admitting: Physical Medicine & Rehabilitation

## 2012-02-14 DIAGNOSIS — M47812 Spondylosis without myelopathy or radiculopathy, cervical region: Secondary | ICD-10-CM

## 2012-02-15 ENCOUNTER — Ambulatory Visit
Admission: RE | Admit: 2012-02-15 | Discharge: 2012-02-15 | Disposition: A | Discharge status: Home / Self Care | Payer: Medicare Other | Source: Ambulatory Visit | Attending: Internal Medicine | Admitting: Internal Medicine

## 2012-02-15 DIAGNOSIS — M5030 Other cervical disc degeneration, unspecified cervical region: Secondary | ICD-10-CM | POA: Diagnosis not present

## 2012-02-15 DIAGNOSIS — G9589 Other specified diseases of spinal cord: Secondary | ICD-10-CM | POA: Diagnosis not present

## 2012-02-15 DIAGNOSIS — M47812 Spondylosis without myelopathy or radiculopathy, cervical region: Secondary | ICD-10-CM | POA: Diagnosis not present

## 2012-02-25 DIAGNOSIS — R634 Abnormal weight loss: Secondary | ICD-10-CM | POA: Diagnosis not present

## 2012-02-25 DIAGNOSIS — I10 Essential (primary) hypertension: Secondary | ICD-10-CM | POA: Diagnosis not present

## 2012-02-25 DIAGNOSIS — Z125 Encounter for screening for malignant neoplasm of prostate: Secondary | ICD-10-CM | POA: Diagnosis not present

## 2012-02-25 DIAGNOSIS — D509 Iron deficiency anemia, unspecified: Secondary | ICD-10-CM | POA: Diagnosis not present

## 2012-02-25 DIAGNOSIS — R39198 Other difficulties with micturition: Secondary | ICD-10-CM | POA: Diagnosis not present

## 2012-03-26 ENCOUNTER — Encounter: Payer: Medicare Other | Attending: Neurosurgery | Admitting: Physical Medicine & Rehabilitation

## 2012-03-26 ENCOUNTER — Encounter: Payer: Self-pay | Admitting: Physical Medicine & Rehabilitation

## 2012-03-26 VITALS — BP 159/82 | HR 62 | Resp 18 | Ht 66.0 in | Wt 237.2 lb

## 2012-03-26 DIAGNOSIS — M961 Postlaminectomy syndrome, not elsewhere classified: Secondary | ICD-10-CM | POA: Diagnosis not present

## 2012-03-26 DIAGNOSIS — F3289 Other specified depressive episodes: Secondary | ICD-10-CM | POA: Insufficient documentation

## 2012-03-26 DIAGNOSIS — Z96659 Presence of unspecified artificial knee joint: Secondary | ICD-10-CM | POA: Diagnosis not present

## 2012-03-26 DIAGNOSIS — F329 Major depressive disorder, single episode, unspecified: Secondary | ICD-10-CM

## 2012-03-26 DIAGNOSIS — M19019 Primary osteoarthritis, unspecified shoulder: Secondary | ICD-10-CM

## 2012-03-26 DIAGNOSIS — F32A Depression, unspecified: Secondary | ICD-10-CM

## 2012-03-26 MED ORDER — OXYCODONE HCL 5 MG PO CAPS: 10.0000 mg | ORAL_CAPSULE | Freq: Four times a day (QID) | ORAL | Status: DC | PRN

## 2012-03-26 MED ORDER — OXYCODONE HCL 30 MG PO TB12: 60.0000 mg | ORAL_TABLET | Freq: Three times a day (TID) | ORAL | Status: DC

## 2012-03-26 NOTE — Patient Instructions (Signed)
Continue your exercises as you have been shown before

## 2012-03-26 NOTE — Progress Notes (Signed)
Subjective:    Patient ID: [name], male    DOB: [birth year], 71 y.o.   MRN: [id number]  HPI [name] is back regarding his multiple pain issues.  He had an MRI of his c-spine which shows cervical myelomalacia at C6-7 due to his chronic slippage of C6 on 7.  He's fused from C2 to T1 posteriorly and C6-7 anteriorly.  He has seen Trey Sailors yet regarding these findings.  We made the referral a month ago.   He states the pain is about the same.  He feels that symptoms have been exacerbated by a recent URI.  He feels that he's had persistent discharge in his throat and a bad "taste" in his mouth. The patient states that his PCP hasn't come up with any answers.   He didn't tolerate the topamax, as it made him confused.  He stopped this.  Pain Inventory Average Pain 8 Pain Right Now 7 My pain is constant and aching  In the last 24 hours, has pain interfered with the following? General activity 9 Relation with others 9 Enjoyment of life 9 What TIME of day is your pain at its worst? all of the time Sleep (in general) Fair  Pain is worse with: bending Pain improves with: rest, heat/ice and medication Relief from Meds: 3  Mobility use a cane ability to climb steps?  yes do you drive?  yes  Function disabled: date disabled 2008  Neuro/Psych numbness tingling trouble walking  Prior Studies Any changes since last visit?  yes CT/MRI  Physicians involved in your care Any changes since last visit?  no     Review of Systems  Musculoskeletal: Positive for gait problem.       Neck and knee pain  Neurological: Positive for numbness.  All other systems reviewed and are negative.       Objective:   Physical Exam  Constitutional: He is oriented to person, place, and time. He appears well-developed and well-nourished.  HENT:  Head: Normocephalic and atraumatic.       Voice raspy/hoarse.  Occasionally clearing throat.  Eyes: Conjunctivae and EOM are normal. Pupils are equal,  round, and reactive to light.  Neck: Normal range of motion.  Cardiovascular: Normal rate and regular rhythm.   Pulmonary/Chest: Effort normal and breath sounds normal.  Abdominal: Soft. Bowel sounds are normal.  Musculoskeletal:       Right shoulder pain with palpation particularly in the lateral  and anteriorly.  Chronic deformities noted from prior surgeries. Pain with resisted ER/IR/abduction right greater than left.  Neurological: He is alert and oriented to person, place, and time. No cranial nerve deficit.  Reflex Scores:      Tricep reflexes are 1+ on the right side and 1+ on the left side.      Bicep reflexes are 1+ on the right side and 1+ on the left side.      Brachioradialis reflexes are 1+ on the right side and 1+ on the left side.      Patellar reflexes are 1+ on the right side and 1+ on the left side.      Achilles reflexes are 1+ on the right side and 1+ on the left side.      Strength fairly well preserved in all 4 limbs. Scattered sensory loss in anterior thighs.   Psychiatric: His speech is normal. He is slowed. Cognition and memory are normal. He expresses impulsivity. He exhibits a depressed mood.  Assessment & Plan:  ASSESSMENT:  1. Chronic cervicalgia status post multiple cervical spine surgeries.  Likely scar tissue component with some neuropathic pain as well. There are  myofascial components also.  2. Right leg pain and spasms. I think this is out of deconditioning  and loss of his flexibility. The patient has had low back problems  in the past as well.  3. Depression.  4. Shoulder pain is more likely to be related to degenerative arthritis and multiple surgical interventions than to his c-spine base on exam and imaging.  PLAN:  1. Recommended symptomatic relief for post nasal drip/allergy. His symptoms have nothing to do with his c-spine. 2. He will resume home exercises to work on cervical and lumbar range of  motion as well as lower extremity  stretching. I discussed  Stretching again with the patient today.  3. Refilled OxyContin 60 mg q.8 hours #90 and oxycodone IR 10 mg, #90.  4. I will see the patient back in 3 months. He will see nursing in 1 month and my PA the next month after. 5. Pt will call Dr. Channing Mutters for a f/u appt to discuss his MRI

## 2012-03-27 ENCOUNTER — Encounter: Payer: Self-pay | Admitting: Physical Medicine & Rehabilitation

## 2012-04-24 ENCOUNTER — Encounter: Payer: Self-pay | Admitting: *Deleted

## 2012-04-24 ENCOUNTER — Encounter: Payer: Medicare Other | Attending: Physical Medicine & Rehabilitation | Admitting: *Deleted

## 2012-04-24 VITALS — BP 117/41 | HR 74 | Resp 16 | Ht 70.0 in | Wt 242.0 lb

## 2012-04-24 DIAGNOSIS — M79609 Pain in unspecified limb: Secondary | ICD-10-CM | POA: Diagnosis not present

## 2012-04-24 DIAGNOSIS — F329 Major depressive disorder, single episode, unspecified: Secondary | ICD-10-CM

## 2012-04-24 DIAGNOSIS — M542 Cervicalgia: Secondary | ICD-10-CM | POA: Insufficient documentation

## 2012-04-24 DIAGNOSIS — M961 Postlaminectomy syndrome, not elsewhere classified: Secondary | ICD-10-CM | POA: Diagnosis not present

## 2012-04-24 DIAGNOSIS — M25519 Pain in unspecified shoulder: Secondary | ICD-10-CM | POA: Diagnosis not present

## 2012-04-24 DIAGNOSIS — M19019 Primary osteoarthritis, unspecified shoulder: Secondary | ICD-10-CM | POA: Diagnosis not present

## 2012-04-24 DIAGNOSIS — M62838 Other muscle spasm: Secondary | ICD-10-CM | POA: Diagnosis not present

## 2012-04-24 DIAGNOSIS — F3289 Other specified depressive episodes: Secondary | ICD-10-CM | POA: Insufficient documentation

## 2012-04-24 DIAGNOSIS — Z96659 Presence of unspecified artificial knee joint: Secondary | ICD-10-CM | POA: Diagnosis not present

## 2012-04-24 DIAGNOSIS — G9589 Other specified diseases of spinal cord: Secondary | ICD-10-CM | POA: Diagnosis not present

## 2012-04-24 MED ORDER — OXYCODONE HCL 5 MG PO CAPS: 10.0000 mg | ORAL_CAPSULE | Freq: Four times a day (QID) | ORAL | Status: DC | PRN

## 2012-04-24 MED ORDER — OXYCODONE HCL 30 MG PO TB12: 60.0000 mg | ORAL_TABLET | Freq: Three times a day (TID) | ORAL | Status: DC

## 2012-04-24 NOTE — Progress Notes (Signed)
Pt comes in today stating that his medication isn't working and he doesn't understand why we won't help him. I advised him that we are doing all that we can and don't want him to feel as if we don't care about his condition. When he comes in next month he will see Dr. Riley Kill and we will see if we can't figure out something for this pt.

## 2012-05-06 ENCOUNTER — Telehealth: Payer: Self-pay

## 2012-05-06 NOTE — Telephone Encounter (Signed)
Pt called to let us know he had his MRI and is in so much pain it is making him choke.  Unclear if he wanted more medication or just FYI.  May need to call to clarify.

## 2012-05-07 ENCOUNTER — Other Ambulatory Visit: Payer: Self-pay | Admitting: *Deleted

## 2012-05-07 ENCOUNTER — Telehealth: Payer: Self-pay | Admitting: Physical Medicine & Rehabilitation

## 2012-05-07 MED ORDER — MELOXICAM 7.5 MG PO TABS: 7.5000 mg | ORAL_TABLET | ORAL | Status: DC

## 2012-05-07 NOTE — Telephone Encounter (Signed)
Lm for pt to clarify what he was calling in regards to.

## 2012-05-07 NOTE — Telephone Encounter (Signed)
Returning call about MRI  

## 2012-05-07 NOTE — Telephone Encounter (Signed)
Lm for pt regarding MRI.

## 2012-05-26 ENCOUNTER — Ambulatory Visit: Payer: Medicare Other | Admitting: Physical Medicine & Rehabilitation

## 2012-05-27 ENCOUNTER — Encounter: Payer: Self-pay | Admitting: Physical Medicine and Rehabilitation

## 2012-05-27 ENCOUNTER — Encounter: Payer: Medicare Other | Attending: Physical Medicine & Rehabilitation | Admitting: Physical Medicine and Rehabilitation

## 2012-05-27 VITALS — BP 192/70 | HR 60 | Resp 16 | Ht 66.0 in | Wt 237.8 lb

## 2012-05-27 DIAGNOSIS — M542 Cervicalgia: Secondary | ICD-10-CM | POA: Diagnosis not present

## 2012-05-27 DIAGNOSIS — Z981 Arthrodesis status: Secondary | ICD-10-CM | POA: Insufficient documentation

## 2012-05-27 DIAGNOSIS — M19019 Primary osteoarthritis, unspecified shoulder: Secondary | ICD-10-CM

## 2012-05-27 DIAGNOSIS — G8929 Other chronic pain: Secondary | ICD-10-CM | POA: Insufficient documentation

## 2012-05-27 DIAGNOSIS — Z96659 Presence of unspecified artificial knee joint: Secondary | ICD-10-CM | POA: Diagnosis not present

## 2012-05-27 DIAGNOSIS — R51 Headache: Secondary | ICD-10-CM | POA: Diagnosis not present

## 2012-05-27 DIAGNOSIS — F329 Major depressive disorder, single episode, unspecified: Secondary | ICD-10-CM

## 2012-05-27 DIAGNOSIS — F3289 Other specified depressive episodes: Secondary | ICD-10-CM

## 2012-05-27 DIAGNOSIS — M961 Postlaminectomy syndrome, not elsewhere classified: Secondary | ICD-10-CM | POA: Diagnosis not present

## 2012-05-27 DIAGNOSIS — M25519 Pain in unspecified shoulder: Secondary | ICD-10-CM | POA: Insufficient documentation

## 2012-05-27 MED ORDER — OXYCODONE HCL 5 MG PO CAPS: 10.0000 mg | ORAL_CAPSULE | Freq: Four times a day (QID) | ORAL | Status: DC | PRN

## 2012-05-27 MED ORDER — OXYCODONE HCL 30 MG PO TB12: 60.0000 mg | ORAL_TABLET | Freq: Three times a day (TID) | ORAL | Status: DC

## 2012-05-27 NOTE — Progress Notes (Signed)
Subjective:    Patient ID: [name], male    DOB: 08-07-1941, 71 y.o.   MRN: [id number]  HPI The patient complains about chronic pain on the top of his head and behind his right ear.  The patient also complains about some neck pain.  The problem has been stable. The patient states that his pain medication is controlling his pain very well. He also reports that he will follow up with his neurosurgeon Dr. Channing Mutters soon. He states that he is walking regularly and doing some exercises regularly.  Pain Inventory Average Pain 9 Pain Right Now 9 My pain is constant  In the last 24 hours, has pain interfered with the following? General activity 9 Relation with others 9 Enjoyment of life 9 What TIME of day is your pain at its worst? daytime Sleep (in general) Good  Pain is worse with: walking and bending Pain improves with: rest Relief from Meds: 6  Mobility use a cane do you drive?  yes  Function I need assistance with the following:  household duties  Neuro/Psych weakness  Prior Studies Any changes since last visit?  no  Physicians involved in your care Any changes since last visit?  no   Family History  Problem Relation Age of Onset  . Cancer Mother 30    cervical cancer  . Cancer Father 36    lung  cancer   History   Social History  . Marital Status: Single    Spouse Name: N/A    Number of Children: N/A  . Years of Education: N/A   Social History Main Topics  . Smoking status: Former Games developer  . Smokeless tobacco: Former Neurosurgeon    Quit date: 02/12/1985  . Alcohol Use: No  . Drug Use: No  . Sexually Active: None   Other Topics Concern  . None   Social History Narrative  . None   Past Surgical History  Procedure Date  . Spine surgery     neck fusion  . Joint replacement     both knees   Past Medical History  Diagnosis Date  . Cervical spondylosis without myelopathy   . Lumbosacral spondylosis without myelopathy   . Primary localized  osteoarthrosis, lower leg   . Primary localized osteoarthrosis, shoulder region   . Cervicalgia   . Degeneration of thoracic or thoracolumbar intervertebral disc   . Depression   . Chronic pain syndrome    BP 192/70  Pulse 60  Resp 16  Ht 5\' 6"  (1.676 m)  Wt 237 lb 12.8 oz (107.865 kg)  BMI 38.38 kg/m2  SpO2 96%    Review of Systems  Constitutional: Positive for unexpected weight change.  Musculoskeletal: Positive for arthralgias and gait problem.  Neurological: Positive for weakness.  All other systems reviewed and are negative.       Objective:   Physical Exam  Constitutional: He is oriented to person, place, and time. He appears well-developed and well-nourished.       obese  HENT:  Head: Normocephalic.  Musculoskeletal: He exhibits tenderness.  Neurological: He is alert and oriented to person, place, and time.  Skin: Skin is warm and dry.  Psychiatric: He has a normal mood and affect.   Symmetric normal motor tone is noted throughout. Normal muscle bulk. Muscle testing reveals 5/5 muscle strength of the upper extremity, except his right UE 4+/5, and 5/5 of the lower extremity.  ROM of cervical spine is severely  Restricted, basically rotation to both sides  is non existent. Fine motor movements are normal in both hands.  DTR in the upper and lower extremity are present and symmetric 2+. No clonus is noted.  Patient arises from chair without difficulty. Wide based gait with a cane.        Assessment & Plan:  1. Chronic cervicalgia status post multiple cervical spine surgeries.  Likely scar tissue component with some neuropathic pain as well. There are  myofascial components also.   3. Depression.  4. Shoulder pain is more likely to be related to degenerative arthritis and multiple surgical interventions than to his c-spine base on exam and imaging.  PLAN:   He will resume home exercises. He will continue his walking program.  3. Refilled OxyContin 60 mg q.8  hours #90 and oxycodone IR 10 mg, #90.  4.  He will see  PA the next month .  5. Pt has scheduled a f/u appt with Dr. Channing Mutters to discuss his MRI

## 2012-05-27 NOTE — Patient Instructions (Signed)
Continue with medication, continue with walking program. Continue with your exercises.

## 2012-06-10 DIAGNOSIS — H9209 Otalgia, unspecified ear: Secondary | ICD-10-CM | POA: Diagnosis not present

## 2012-06-26 ENCOUNTER — Encounter: Payer: Medicare Other | Attending: Physical Medicine & Rehabilitation | Admitting: Physical Medicine and Rehabilitation

## 2012-06-26 ENCOUNTER — Encounter: Payer: Self-pay | Admitting: Physical Medicine and Rehabilitation

## 2012-06-26 VITALS — BP 154/56 | HR 52 | Resp 16 | Ht 66.0 in | Wt 236.0 lb

## 2012-06-26 DIAGNOSIS — Q762 Congenital spondylolisthesis: Secondary | ICD-10-CM | POA: Diagnosis not present

## 2012-06-26 DIAGNOSIS — Z96659 Presence of unspecified artificial knee joint: Secondary | ICD-10-CM

## 2012-06-26 DIAGNOSIS — Z5181 Encounter for therapeutic drug level monitoring: Secondary | ICD-10-CM | POA: Diagnosis not present

## 2012-06-26 DIAGNOSIS — M542 Cervicalgia: Secondary | ICD-10-CM | POA: Diagnosis not present

## 2012-06-26 DIAGNOSIS — F329 Major depressive disorder, single episode, unspecified: Secondary | ICD-10-CM | POA: Diagnosis not present

## 2012-06-26 DIAGNOSIS — M19019 Primary osteoarthritis, unspecified shoulder: Secondary | ICD-10-CM

## 2012-06-26 DIAGNOSIS — G8929 Other chronic pain: Secondary | ICD-10-CM | POA: Diagnosis not present

## 2012-06-26 DIAGNOSIS — Z79899 Other long term (current) drug therapy: Secondary | ICD-10-CM | POA: Diagnosis not present

## 2012-06-26 DIAGNOSIS — M961 Postlaminectomy syndrome, not elsewhere classified: Secondary | ICD-10-CM | POA: Diagnosis not present

## 2012-06-26 MED ORDER — OXYCODONE HCL 5 MG PO CAPS: 10.0000 mg | ORAL_CAPSULE | Freq: Four times a day (QID) | ORAL | Status: DC | PRN

## 2012-06-26 MED ORDER — OXYCODONE HCL 30 MG PO TB12: 60.0000 mg | ORAL_TABLET | Freq: Three times a day (TID) | ORAL | Status: DC

## 2012-06-26 NOTE — Progress Notes (Signed)
Subjective:    Patient ID: [name], male    DOB: [birth year], 71 y.o.   MRN: [id number]  HPI The patient complains about chronic pain on the top of his head and behind his right ear. The patient also complains about some neck pain.  The problem has been stable. The patient states that his pain medication is controlling his pain very well. He also reports that he will follow up with his neurosurgeon Dr. Channing Mutters soon. He states that he is walking regularly and doing some exercises regularly.   Pain Inventory Average Pain 8 Pain Right Now 7 My pain is sharp and stabbing  In the last 24 hours, has pain interfered with the following? General activity 8 Relation with others 9 Enjoyment of life 9 What TIME of day is your pain at its worst? daytime Sleep (in general) Fair  Pain is worse with: some activites Pain improves with: rest and medication Relief from Meds: 6  Mobility use a cane how many minutes can you walk? 5-10 ability to climb steps?  yes do you drive?  yes  Function disabled: date disabled   Neuro/Psych weakness loss of taste or smell  Prior Studies Any changes since last visit?  no  Physicians involved in your care Any changes since last visit?  no   Family History  Problem Relation Age of Onset  . Cancer Mother 30    cervical cancer  . Cancer Father 36    lung  cancer   History   Social History  . Marital Status: Single    Spouse Name: N/A    Number of Children: N/A  . Years of Education: N/A   Social History Main Topics  . Smoking status: Former Games developer  . Smokeless tobacco: Former Neurosurgeon    Quit date: 02/12/1985  . Alcohol Use: No  . Drug Use: No  . Sexually Active: None   Other Topics Concern  . None   Social History Narrative  . None   Past Surgical History  Procedure Date  . Spine surgery     neck fusion  . Joint replacement     both knees   Past Medical History  Diagnosis Date  . Cervical spondylosis without myelopathy   .  Lumbosacral spondylosis without myelopathy   . Primary localized osteoarthrosis, lower leg   . Primary localized osteoarthrosis, shoulder region   . Cervicalgia   . Degeneration of thoracic or thoracolumbar intervertebral disc   . Depression   . Chronic pain syndrome    BP 154/56  Pulse 52  Resp 16  Ht 5\' 6"  (1.676 m)  Wt 236 lb (107.049 kg)  BMI 38.09 kg/m2  SpO2 96%   Review of Systems  Constitutional:       Loss of sense of taste  Musculoskeletal:       Neck pain, knee pain   Neurological: Positive for weakness.  All other systems reviewed and are negative.       Objective:   Physical Exam Constitutional: He is oriented to person, place, and time. He appears well-developed and well-nourished.  obese  HENT:  Head: Normocephalic.  Musculoskeletal: He exhibits tenderness.  Neurological: He is alert and oriented to person, place, and time.  Skin: Skin is warm and dry.  Psychiatric: He has a normal mood and affect.   Symmetric normal motor tone is noted throughout. Normal muscle bulk. Muscle testing reveals 5/5 muscle strength of the upper extremity, except his right UE 4+/5, and 5/5  of the lower extremity. ROM of cervical spine is severely Restricted, basically rotation to both sides is non existent. Fine motor movements are normal in both hands.  DTR in the upper and lower extremity are present and symmetric 2+. No clonus is noted.  Patient arises from chair without difficulty. Wide based gait with a cane.         Assessment & Plan:  1. Chronic cervicalgia status post multiple cervical spine surgeries.  2. Spondylolisthesis C6-7    PLAN:  He will resume home exercises. He will continue his walking program.  3. Refilled OxyContin 60 mg q.8 hours #90 and oxycodone IR 10 mg, #90.  4. He will see Dr. Riley Kill the next month .  5. Pt has scheduled a f/u appt with Dr. Channing Mutters to discuss his MRI on 08/06/2012

## 2012-06-26 NOTE — Patient Instructions (Signed)
Advised patient to follow up with neurosurgeon.

## 2012-07-02 ENCOUNTER — Telehealth: Payer: Self-pay | Admitting: Physical Medicine & Rehabilitation

## 2012-07-02 NOTE — Telephone Encounter (Signed)
Patient having a lot of trouble, please call.

## 2012-07-02 NOTE — Telephone Encounter (Signed)
Pt states that he is having headaches for quite some time after hitting his head on his dresser. Has a hard time walking and his neck is hurting more. Swallowing is a challenge. Please advise.

## 2012-07-02 NOTE — Telephone Encounter (Signed)
He might have to follow up with the ED,if his symptoms are so severe, he has spondylolisthesis in his neck.

## 2012-07-02 NOTE — Telephone Encounter (Signed)
Pt aware.

## 2012-07-03 ENCOUNTER — Encounter (HOSPITAL_COMMUNITY): Payer: Self-pay | Admitting: *Deleted

## 2012-07-03 ENCOUNTER — Emergency Department (HOSPITAL_COMMUNITY): Payer: Medicare Other

## 2012-07-03 ENCOUNTER — Emergency Department (HOSPITAL_COMMUNITY)
Admission: EM | Admit: 2012-07-03 | Discharge: 2012-07-03 | Disposition: A | Discharge status: Home / Self Care | Payer: Medicare Other | Attending: Emergency Medicine | Admitting: Emergency Medicine

## 2012-07-03 ENCOUNTER — Telehealth: Payer: Self-pay | Admitting: Physical Medicine & Rehabilitation

## 2012-07-03 DIAGNOSIS — I6789 Other cerebrovascular disease: Secondary | ICD-10-CM | POA: Diagnosis not present

## 2012-07-03 DIAGNOSIS — Z79899 Other long term (current) drug therapy: Secondary | ICD-10-CM | POA: Insufficient documentation

## 2012-07-03 DIAGNOSIS — F329 Major depressive disorder, single episode, unspecified: Secondary | ICD-10-CM | POA: Insufficient documentation

## 2012-07-03 DIAGNOSIS — M47817 Spondylosis without myelopathy or radiculopathy, lumbosacral region: Secondary | ICD-10-CM | POA: Insufficient documentation

## 2012-07-03 DIAGNOSIS — M47812 Spondylosis without myelopathy or radiculopathy, cervical region: Secondary | ICD-10-CM | POA: Insufficient documentation

## 2012-07-03 DIAGNOSIS — F3289 Other specified depressive episodes: Secondary | ICD-10-CM | POA: Insufficient documentation

## 2012-07-03 DIAGNOSIS — R51 Headache: Secondary | ICD-10-CM | POA: Insufficient documentation

## 2012-07-03 DIAGNOSIS — G319 Degenerative disease of nervous system, unspecified: Secondary | ICD-10-CM | POA: Diagnosis not present

## 2012-07-03 NOTE — ED Notes (Addendum)
Head hurts whenever I turn my neck or open my mouth, symptoms been going on for awhile, numbness and tingling left hand

## 2012-07-03 NOTE — ED Provider Notes (Signed)
History     CSN: [id number]  Arrival date & time 07/03/12  1408   First MD Initiated Contact with Patient 07/03/12 1446      Chief Complaint  Patient presents with  . Headache    The history is provided by the patient.   the patient reports occasional sharp pain across the vertex of his scalp over the past week.  He reports this occurs when he opens his mouth that she would sometimes when he turns his head in certain positions.  The discomfort is transient.  He denies loss consciousness or recent head trauma.  He denies use of anticoagulants.  He has no weakness of his upper lower extremities.  He has chronic back pain he reports no change in this.  He denies chest pain shortness of breath.  Has had no visual changes.  Nothing worsens the symptoms except as described above.  Nothing improves his symptoms.  His symptoms are intermittent and mild in severity when they occur although they are transient.  Past Medical History  Diagnosis Date  . Cervical spondylosis without myelopathy   . Lumbosacral spondylosis without myelopathy   . Primary localized osteoarthrosis, lower leg   . Primary localized osteoarthrosis, shoulder region   . Cervicalgia   . Degeneration of thoracic or thoracolumbar intervertebral disc   . Depression   . Chronic pain syndrome     Past Surgical History  Procedure Date  . Spine surgery     neck fusion  . Joint replacement     both knees    Family History  Problem Relation Age of Onset  . Cancer Mother 30    cervical cancer  . Cancer Father 59    lung  cancer    History  Substance Use Topics  . Smoking status: Former Games developer  . Smokeless tobacco: Former Neurosurgeon    Quit date: 02/12/1985  . Alcohol Use: No      Review of Systems  All other systems reviewed and are negative.    Allergies  Zanaflex  Home Medications   Current Outpatient Rx  Name Route Sig Dispense Refill  . ASPIRIN 81 MG PO TABS Oral Take 81 mg by mouth daily.    .  BUPROPION HCL ER (SR) 150 MG PO TB12 Oral Take 150 mg by mouth 2 (two) times daily.      Marland Kitchen DIAZEPAM 5 MG PO TABS Oral Take 1 tablet by mouth every 6 (six) hours as needed. anxiety    . DICLOFENAC SODIUM 1 % TD GEL Topical Apply 1 application topically 3 (three) times daily. Applies to knees    . DULOXETINE HCL 60 MG PO CPEP Oral Take 60 mg by mouth daily.    . MELOXICAM 7.5 MG PO TABS Oral Take 7.5 mg by mouth every morning.    . NEBIVOLOL HCL 5 MG PO TABS Oral Take 5 mg by mouth daily.    . OXYCODONE HCL 5 MG PO CAPS Oral Take 5 mg by mouth every 6 (six) hours as needed.    . OXYCODONE HCL ER 30 MG PO TB12 Oral Take 60 mg by mouth every 8 (eight) hours.    Marland Kitchen SIMVASTATIN 10 MG PO TABS Oral Take 10 mg by mouth at bedtime.    . SUCRALFATE 1 G PO TABS Oral Take 1 g by mouth 2 (two) times daily.    . TOPIRAMATE 25 MG PO TABS Oral Take 50 mg by mouth at bedtime.      BP  147/66  Pulse 70  Temp 97.7 F (36.5 C) (Oral)  Resp 16  SpO2 94%  Physical Exam  Nursing note and vitals reviewed. Constitutional: He is oriented to person, place, and time. He appears well-developed and well-nourished.  HENT:  Head: Normocephalic and atraumatic.  Eyes: EOM are normal.  Neck: Normal range of motion.  Cardiovascular: Normal rate, regular rhythm, normal heart sounds and intact distal pulses.   Pulmonary/Chest: Effort normal and breath sounds normal. No respiratory distress.  Abdominal: Soft. He exhibits no distension. There is no tenderness.  Musculoskeletal: Normal range of motion.  Neurological: He is alert and oriented to person, place, and time.  Skin: Skin is warm and dry.  Psychiatric: He has a normal mood and affect. Judgment normal.    ED Course  Procedures   Labs Reviewed - No data to display Ct Head Wo Contrast  07/03/2012  *RADIOLOGY REPORT*  Clinical Data: Severe headache.  Fall 4 months ago  CT HEAD WITHOUT CONTRAST  Technique:  Contiguous axial images were obtained from the base of the  skull through the vertex without contrast.  Comparison: CT head 04/25/2010  Findings: Frontal lobe atrophy bilaterally is similar.  Negative for hydrocephalus.  Mild chronic hypodensity in the cerebral white matter bilaterally.  No acute infarct or mass.  No intracranial hemorrhage.  Cervical fusion has have been performed with posterior rods extending to the occipital bone  IMPRESSION: Frontal lobe atrophy.  Mild chronic microvascular ischemic change. No acute abnormality.  Original Report Authenticated By: Camelia Phenes, M.D.    I personally reviewed the imaging tests through PACS system  I reviewed available ER/hospitalization records thought the EMR   1. Headache       MDM  The patient's pain is very atypical.  A CT scan of his head is normal.  He has no upper lower extremity neurologic deficits.  This does not appear to be a stroke.  I'm not sure what the cause of his intermittent head pain is.  The patient will followup with his primary care physician and his pain physician regarding this.        Lyanne Co, MD 07/03/12 1539

## 2012-07-03 NOTE — Telephone Encounter (Signed)
Patient has called again needing someone to call him back.

## 2012-07-03 NOTE — Telephone Encounter (Signed)
L/m returning pts call.

## 2012-07-03 NOTE — ED Notes (Signed)
Pt states he has had multiple neck surgeries over the past 5 years. Pt states he feel recently while he slip in the shower and hit the top of his head on his dresser. Pt states he has started to have headache and pain when he turns he neck to right. Pt denies any chest pain, blurred vision, or dizziness. Pt states his is allow having pain in his throat. Pt states he has not had any memory impairment just a headache. Pt states the left arm numbleness is not new it is persistent since his neck surgeries.

## 2012-07-04 NOTE — Telephone Encounter (Signed)
L/m returning pts call.

## 2012-07-25 ENCOUNTER — Encounter
Payer: Medicare Other | Attending: Physical Medicine and Rehabilitation | Admitting: Physical Medicine and Rehabilitation

## 2012-07-25 ENCOUNTER — Encounter: Payer: Self-pay | Admitting: Physical Medicine and Rehabilitation

## 2012-07-25 VITALS — BP 142/82 | HR 67 | Resp 16 | Ht 66.0 in | Wt 234.0 lb

## 2012-07-25 DIAGNOSIS — Z9181 History of falling: Secondary | ICD-10-CM | POA: Insufficient documentation

## 2012-07-25 DIAGNOSIS — R279 Unspecified lack of coordination: Secondary | ICD-10-CM | POA: Diagnosis not present

## 2012-07-25 DIAGNOSIS — Z96659 Presence of unspecified artificial knee joint: Secondary | ICD-10-CM | POA: Diagnosis not present

## 2012-07-25 DIAGNOSIS — M4312 Spondylolisthesis, cervical region: Secondary | ICD-10-CM

## 2012-07-25 DIAGNOSIS — G8929 Other chronic pain: Secondary | ICD-10-CM | POA: Diagnosis not present

## 2012-07-25 DIAGNOSIS — R51 Headache: Secondary | ICD-10-CM | POA: Insufficient documentation

## 2012-07-25 DIAGNOSIS — M961 Postlaminectomy syndrome, not elsewhere classified: Secondary | ICD-10-CM | POA: Diagnosis not present

## 2012-07-25 DIAGNOSIS — M4310 Spondylolisthesis, site unspecified: Secondary | ICD-10-CM

## 2012-07-25 DIAGNOSIS — Q762 Congenital spondylolisthesis: Secondary | ICD-10-CM | POA: Insufficient documentation

## 2012-07-25 DIAGNOSIS — M542 Cervicalgia: Secondary | ICD-10-CM | POA: Insufficient documentation

## 2012-07-25 MED ORDER — OXYCODONE HCL 30 MG PO TB12: 60.0000 mg | ORAL_TABLET | Freq: Three times a day (TID) | ORAL | Status: DC

## 2012-07-25 MED ORDER — OXYCODONE HCL 5 MG PO CAPS: 5.0000 mg | ORAL_CAPSULE | Freq: Four times a day (QID) | ORAL | Status: DC | PRN

## 2012-07-25 NOTE — Patient Instructions (Signed)
Follow up with Dr Roy.

## 2012-07-25 NOTE — Progress Notes (Signed)
Subjective:    Patient ID: [name], male    DOB: Sep 03, 1941, 71 y.o.   MRN: [id number]  HPI The patient complains about chronic pain on the top of his head and behind his right ear. The patient also complains about some neck pain.  The problem has been stable. The patient states that his pain medication is controlling his pain.He reports that he has fallen again, because of balance problems. He also reports that he will follow up with his neurosurgeon Dr. Channing Mutters on the 21st of August.  Pain Inventory Average Pain 8 Pain Right Now 8 My pain is sharp and dull  In the last 24 hours, has pain interfered with the following? General activity 9 Relation with others 9 Enjoyment of life 9 What TIME of day is your pain at its worst? day and evening Sleep (in general) Poor  Pain is worse with: bending and some activites Pain improves with: rest and medication Relief from Meds: 7  Mobility how many minutes can you walk? 15-20 ability to climb steps?  yes do you drive?  yes Do you have any goals in this area?  yes  Function disabled: date disabled  Do you have any goals in this area?  yes  Neuro/Psych weakness numbness loss of taste or smell  Prior Studies Any changes since last visit?  no  Physicians involved in your care Any changes since last visit?  no   Family History  Problem Relation Age of Onset  . Cancer Mother 30    cervical cancer  . Cancer Father 69    lung  cancer   History   Social History  . Marital Status: Single    Spouse Name: N/A    Number of Children: N/A  . Years of Education: N/A   Social History Main Topics  . Smoking status: Former Games developer  . Smokeless tobacco: Former Neurosurgeon    Quit date: 02/12/1985  . Alcohol Use: No  . Drug Use: No  . Sexually Active: None   Other Topics Concern  . None   Social History Narrative  . None   Past Surgical History  Procedure Date  . Spine surgery     neck fusion  . Joint replacement     both  knees   Past Medical History  Diagnosis Date  . Cervical spondylosis without myelopathy   . Lumbosacral spondylosis without myelopathy   . Primary localized osteoarthrosis, lower leg   . Primary localized osteoarthrosis, shoulder region   . Cervicalgia   . Degeneration of thoracic or thoracolumbar intervertebral disc   . Depression   . Chronic pain syndrome    BP 142/82  Pulse 67  Resp 16  Ht 5\' 6"  (1.676 m)  Wt 234 lb (106.142 kg)  BMI 37.77 kg/m2  SpO2 92%     Review of Systems  HENT: Positive for neck pain.   Musculoskeletal: Positive for myalgias and arthralgias.  Neurological: Positive for weakness and numbness.  All other systems reviewed and are negative.       Objective:   Physical Exam Constitutional: He is oriented to person, place, and time. He appears well-developed and well-nourished.  obese  HENT:  Head: Normocephalic.  Musculoskeletal: He exhibits tenderness.  Neurological: He is alert and oriented to person, place, and time.  Skin: Skin is warm and dry.  Psychiatric: He has a normal mood and affect.  Symmetric normal motor tone is noted throughout. Normal muscle bulk. Muscle testing reveals 5/5 muscle  strength of the upper extremity, except his right UE 4+/5, and 5/5 of the lower extremity. ROM of cervical spine is severely Restricted, basically rotation to both sides is non existent. Fine motor movements are normal in both hands.  DTR in the upper and lower extremity are present and symmetric 2+. No clonus is noted.  Patient arises from chair with difficulty. Wide based gait with a cane.         Assessment & Plan:  1. Chronic cervicalgia status post multiple cervical spine surgeries.  2. Spondylolisthesis C6-7  PLAN:  He will resume home exercises. He will continue his walking program.  3. Refilled OxyContin 60 mg q.8 hours #90 and oxycodone IR 10 mg, #90.  4.  Pt has scheduled a f/u appt with Dr. Channing Mutters to discuss his MRI on 08/06/2012 Follow  up in one month

## 2012-08-06 DIAGNOSIS — M4712 Other spondylosis with myelopathy, cervical region: Secondary | ICD-10-CM | POA: Diagnosis not present

## 2012-08-13 ENCOUNTER — Telehealth: Payer: Self-pay | Admitting: Physical Medicine & Rehabilitation

## 2012-08-13 NOTE — Telephone Encounter (Signed)
Having issues swallowing feels like he is choking , and increased leg pain.  Needs Clydie Braun to call him.

## 2012-08-13 NOTE — Telephone Encounter (Signed)
Lm advising patient to call office or go to emergency room regarding issue.

## 2012-08-22 ENCOUNTER — Encounter
Payer: Medicare Other | Attending: Physical Medicine and Rehabilitation | Admitting: Physical Medicine and Rehabilitation

## 2012-08-22 ENCOUNTER — Encounter: Payer: Self-pay | Admitting: Physical Medicine and Rehabilitation

## 2012-08-22 VITALS — BP 128/76 | HR 66 | Resp 18 | Ht 68.0 in | Wt 234.0 lb

## 2012-08-22 DIAGNOSIS — G8929 Other chronic pain: Secondary | ICD-10-CM | POA: Insufficient documentation

## 2012-08-22 DIAGNOSIS — R269 Unspecified abnormalities of gait and mobility: Secondary | ICD-10-CM | POA: Insufficient documentation

## 2012-08-22 DIAGNOSIS — M47812 Spondylosis without myelopathy or radiculopathy, cervical region: Secondary | ICD-10-CM | POA: Insufficient documentation

## 2012-08-22 DIAGNOSIS — M62838 Other muscle spasm: Secondary | ICD-10-CM | POA: Insufficient documentation

## 2012-08-22 DIAGNOSIS — M961 Postlaminectomy syndrome, not elsewhere classified: Secondary | ICD-10-CM

## 2012-08-22 DIAGNOSIS — M542 Cervicalgia: Secondary | ICD-10-CM | POA: Diagnosis not present

## 2012-08-22 MED ORDER — OXYCODONE HCL 5 MG PO CAPS: 5.0000 mg | ORAL_CAPSULE | Freq: Four times a day (QID) | ORAL | Status: DC | PRN

## 2012-08-22 MED ORDER — OXYCODONE HCL 30 MG PO TB12: 60.0000 mg | ORAL_TABLET | Freq: Three times a day (TID) | ORAL | Status: DC

## 2012-08-22 MED ORDER — DIAZEPAM 5 MG PO TABS: 5.0000 mg | ORAL_TABLET | Freq: Four times a day (QID) | ORAL | Status: DC | PRN

## 2012-08-22 NOTE — Patient Instructions (Signed)
Continue with walking and exercising as tolerated, and be careful when walking.

## 2012-08-22 NOTE — Progress Notes (Signed)
Subjective:    Patient ID: [name], male    DOB: 22-Jan-1941, 71 y.o.   MRN: [id number]  HPI The patient complains about chronic pain on the top of his head and behind his right ear. The patient also complains about some neck pain.  The problem has been stable. The patient states that his pain medication is controlling his pain.He reports that he has followed up with his neurosurgeon Dr. Channing Mutters on the 21st of August, the patient is very frustrated, because Dr. Channing Mutters just told him that he should see him again in 6 month, and that he is not considering any further surgery at this point.  Pain Inventory Average Pain 9 Pain Right Now 8 My pain is constant  In the last 24 hours, has pain interfered with the following? General activity 7 Relation with others 7 Enjoyment of life 9 What TIME of day is your pain at its worst? daytime Sleep (in general) Poor  Pain is worse with: walking Pain improves with: rest and medication Relief from Meds: 5  Mobility walk with assistance use a cane do you drive?  yes  Function retired  Neuro/Psych numbness trouble walking loss of taste or smell  Prior Studies Any changes since last visit?  no  Physicians involved in your care Any changes since last visit?  no   Family History  Problem Relation Age of Onset  . Cancer Mother 30    cervical cancer  . Cancer Father 52    lung  cancer   History   Social History  . Marital Status: Single    Spouse Name: N/A    Number of Children: N/A  . Years of Education: N/A   Social History Main Topics  . Smoking status: Former Games developer  . Smokeless tobacco: Former Neurosurgeon    Quit date: 02/12/1985  . Alcohol Use: No  . Drug Use: No  . Sexually Active: None   Other Topics Concern  . None   Social History Narrative  . None   Past Surgical History  Procedure Date  . Spine surgery     neck fusion  . Joint replacement     both knees   Past Medical History  Diagnosis Date  . Cervical  spondylosis without myelopathy   . Lumbosacral spondylosis without myelopathy   . Primary localized osteoarthrosis, lower leg   . Primary localized osteoarthrosis, shoulder region   . Cervicalgia   . Degeneration of thoracic or thoracolumbar intervertebral disc   . Depression   . Chronic pain syndrome         Review of Systems  HENT: Positive for neck pain and neck stiffness.   Eyes: Negative.   Respiratory: Negative.   Cardiovascular: Negative.   Gastrointestinal: Negative.   Genitourinary: Negative.   Musculoskeletal: Positive for gait problem.  Skin: Positive for rash.  Neurological: Positive for weakness.  Hematological: Negative.        Objective:   Physical Exam Constitutional: He is oriented to person, place, and time. He appears well-developed and well-nourished.  obese  HENT:  Head: Normocephalic.  Musculoskeletal: He exhibits tenderness.  Neurological: He is alert and oriented to person, place, and time.  Skin: Skin is warm and dry.  Psychiatric: He has a normal mood and affect.  Symmetric normal motor tone is noted throughout. Normal muscle bulk. Muscle testing reveals 5/5 muscle strength of the upper extremity, except his right UE 4+/5, and 5/5 of the lower extremity. ROM of cervical spine is  severely Restricted, basically rotation to both sides is non existent. Fine motor movements are normal in both hands.  DTR in the upper and lower extremity are present and symmetric 2+. No clonus is noted.  Patient arises from chair with difficulty. Wide based gait with a cane.         Assessment & Plan:  1. Chronic cervicalgia status post multiple cervical spine surgeries.  2. Spondylolisthesis C6-7  PLAN:  He will resume home exercises. He will continue his walking program.  3. Refilled OxyContin 60 mg q.8 hours #90 and oxycodone IR 10 mg, #90, refilled his Valium 5mg  #30, which helps with his muscle spasms, he uses this medication very rarely.  4. Pt saw Dr. Channing Mutters  to discuss his MRI on 08/06/2012 , the patient states, that Dr. Channing Mutters is not considering other treatment than pain management at this point. Patient is considering getting a second opinion. The patient will talk to his PCP on Monday. The patient is very frustrated. Follow up in one month

## 2012-08-25 DIAGNOSIS — R1313 Dysphagia, pharyngeal phase: Secondary | ICD-10-CM | POA: Diagnosis not present

## 2012-08-25 DIAGNOSIS — M4712 Other spondylosis with myelopathy, cervical region: Secondary | ICD-10-CM | POA: Diagnosis not present

## 2012-09-08 ENCOUNTER — Telehealth: Payer: Self-pay | Admitting: *Deleted

## 2012-09-08 DIAGNOSIS — M961 Postlaminectomy syndrome, not elsewhere classified: Secondary | ICD-10-CM

## 2012-09-08 DIAGNOSIS — M25519 Pain in unspecified shoulder: Secondary | ICD-10-CM | POA: Diagnosis not present

## 2012-09-08 DIAGNOSIS — M4312 Spondylolisthesis, cervical region: Secondary | ICD-10-CM

## 2012-09-08 NOTE — Telephone Encounter (Signed)
The patient told me at his last visit that he is considering a second opinion, see last note, if he wants a referral, I can order one

## 2012-09-08 NOTE — Telephone Encounter (Signed)
Has a question for Clydie Braun. Please call.

## 2012-09-08 NOTE — Telephone Encounter (Signed)
The last time he saw Clydie Braun she told him that he needed a second opinion. He is wanting Clydie Braun to refer him to Dr. Danielle Dess.   Would you be willing to do this referral?

## 2012-09-08 NOTE — Telephone Encounter (Signed)
He would like the referral. °

## 2012-09-09 NOTE — Telephone Encounter (Signed)
Did the referral to neurosurgery

## 2012-09-18 ENCOUNTER — Other Ambulatory Visit: Payer: Self-pay | Admitting: *Deleted

## 2012-09-18 ENCOUNTER — Encounter
Payer: Medicare Other | Attending: Physical Medicine and Rehabilitation | Admitting: Physical Medicine and Rehabilitation

## 2012-09-18 ENCOUNTER — Encounter: Payer: Self-pay | Admitting: Physical Medicine and Rehabilitation

## 2012-09-18 VITALS — BP 112/54 | HR 59 | Resp 16 | Ht 68.0 in | Wt 234.0 lb

## 2012-09-18 DIAGNOSIS — M542 Cervicalgia: Secondary | ICD-10-CM | POA: Diagnosis not present

## 2012-09-18 DIAGNOSIS — M961 Postlaminectomy syndrome, not elsewhere classified: Secondary | ICD-10-CM | POA: Diagnosis not present

## 2012-09-18 DIAGNOSIS — Q762 Congenital spondylolisthesis: Secondary | ICD-10-CM | POA: Insufficient documentation

## 2012-09-18 DIAGNOSIS — G8929 Other chronic pain: Secondary | ICD-10-CM | POA: Diagnosis not present

## 2012-09-18 MED ORDER — OXYCODONE HCL 30 MG PO TB12: 60.0000 mg | ORAL_TABLET | Freq: Three times a day (TID) | ORAL | Status: DC

## 2012-09-18 MED ORDER — OXYCODONE HCL 5 MG PO CAPS: 5.0000 mg | ORAL_CAPSULE | Freq: Four times a day (QID) | ORAL | Status: DC | PRN

## 2012-09-18 NOTE — Patient Instructions (Signed)
Follow up with a neurosurgeon for a second opinion, continue with medication

## 2012-09-18 NOTE — Progress Notes (Signed)
Subjective:    Patient ID: [name], male    DOB: [birth year], 71 y.o.   MRN: [id number]  HPI The patient complains about chronic pain on the top of his head and behind his right ear. The patient also complains about some neck pain.  The problem has been stable. The patient states that his pain medication is controlling his pain.He reports that he has followed up with his neurosurgeon Dr. Channing Mutters on the 21st of August, the patient is very frustrated, because Dr. Channing Mutters just told him that he should see him again in 6 month, and that he is not considering any further surgery at this point.  Pain Inventory Average Pain 10 Pain Right Now 6 My pain is aching  In the last 24 hours, has pain interfered with the following? General activity 9 Relation with others 8 Enjoyment of life 8 What TIME of day is your pain at its worst? daytime Sleep (in general) Fair  Pain is worse with: walking Pain improves with: rest Relief from Meds: 0  Mobility use a cane ability to climb steps?  no do you drive?  yes  Function disabled: date disabled  I need assistance with the following:  shopping  Neuro/Psych weakness loss of taste or smell  Prior Studies Any changes since last visit?  no  Physicians involved in your care Any changes since last visit?  no   Family History  Problem Relation Age of Onset  . Cancer Mother 30    cervical cancer  . Cancer Father 100    lung  cancer   History   Social History  . Marital Status: Single    Spouse Name: N/A    Number of Children: N/A  . Years of Education: N/A   Social History Main Topics  . Smoking status: Former Games developer  . Smokeless tobacco: Former Neurosurgeon    Quit date: 02/12/1985  . Alcohol Use: No  . Drug Use: No  . Sexually Active: None   Other Topics Concern  . None   Social History Narrative  . None   Past Surgical History  Procedure Date  . Spine surgery     neck fusion  . Joint replacement     both knees   Past Medical  History  Diagnosis Date  . Cervical spondylosis without myelopathy   . Lumbosacral spondylosis without myelopathy   . Primary localized osteoarthrosis, lower leg   . Primary localized osteoarthrosis, shoulder region   . Cervicalgia   . Degeneration of thoracic or thoracolumbar intervertebral disc   . Depression   . Chronic pain syndrome    BP 112/54  Pulse 59  Resp 16  Ht 5\' 8"  (1.727 m)  Wt 234 lb (106.142 kg)  BMI 35.58 kg/m2  SpO2 92%      Review of Systems  Constitutional: Negative.   HENT: Positive for neck pain and neck stiffness.   Eyes: Negative.   Respiratory: Negative.   Cardiovascular: Negative.   Gastrointestinal: Negative.   Genitourinary: Negative.   Musculoskeletal: Positive for myalgias, back pain and arthralgias.  Skin: Negative.   Neurological: Negative.   Hematological: Negative.   Psychiatric/Behavioral: Negative.        Objective:   Physical Exam Constitutional: He is oriented to person, place, and time. He appears well-developed and well-nourished.  obese  HENT:  Head: Normocephalic.  Musculoskeletal: He exhibits tenderness.  Neurological: He is alert and oriented to person, place, and time.  Skin: Skin is warm and dry.  Psychiatric: He has a normal mood and affect.  Symmetric normal motor tone is noted throughout. Normal muscle bulk. Muscle testing reveals 5/5 muscle strength of the upper extremity, except his right UE 4+/5, and 5/5 of the lower extremity. ROM of cervical spine is severely Restricted, basically rotation to both sides is non existent. Fine motor movements are normal in both hands.  DTR in the upper and lower extremity are present and symmetric 2+. No clonus is noted.  Patient arises from chair with difficulty. Wide based gait with a cane, difficulty with balance.         Assessment & Plan:  1. Chronic cervicalgia status post multiple cervical spine surgeries.  2. Spondylolisthesis C6-7  PLAN:  He will resume home  exercises. He will continue his walking program.  3. Refilled OxyContin 60 mg q.8 hours #90 and oxycodone IR 10 mg, #90, refilled his Valium 5mg  #30, which helps with his muscle spasms, he uses this medication very rarely.  4. Pt saw Dr. Channing Mutters to discuss his MRI on 08/06/2012 , the patient states, that Dr. Channing Mutters is not considering other treatment than pain management at this point. Patient is considering getting a second opinion, I referred him to another neurosurgeon, the referral was not send over yet, I told the front desk stuff, that they should make sure that the referral was send over today. Follow up in one month

## 2012-09-29 DIAGNOSIS — M4712 Other spondylosis with myelopathy, cervical region: Secondary | ICD-10-CM | POA: Diagnosis not present

## 2012-10-16 ENCOUNTER — Encounter
Payer: Medicare Other | Attending: Physical Medicine and Rehabilitation | Admitting: Physical Medicine and Rehabilitation

## 2012-10-16 ENCOUNTER — Encounter: Payer: Self-pay | Admitting: Physical Medicine and Rehabilitation

## 2012-10-16 VITALS — BP 150/77 | HR 65 | Resp 14 | Ht 68.0 in | Wt 250.0 lb

## 2012-10-16 DIAGNOSIS — M961 Postlaminectomy syndrome, not elsewhere classified: Secondary | ICD-10-CM

## 2012-10-16 DIAGNOSIS — R51 Headache: Secondary | ICD-10-CM | POA: Insufficient documentation

## 2012-10-16 DIAGNOSIS — G8929 Other chronic pain: Secondary | ICD-10-CM | POA: Insufficient documentation

## 2012-10-16 DIAGNOSIS — M542 Cervicalgia: Secondary | ICD-10-CM

## 2012-10-16 DIAGNOSIS — Q762 Congenital spondylolisthesis: Secondary | ICD-10-CM | POA: Diagnosis not present

## 2012-10-16 MED ORDER — OXYCODONE HCL ER 30 MG PO T12A: 2.0000 | EXTENDED_RELEASE_TABLET | Freq: Three times a day (TID) | ORAL | Status: DC

## 2012-10-16 MED ORDER — OXYCODONE HCL 5 MG PO CAPS: 5.0000 mg | ORAL_CAPSULE | Freq: Four times a day (QID) | ORAL | Status: DC | PRN

## 2012-10-16 NOTE — Progress Notes (Signed)
Subjective:    Patient ID: [name], male    DOB: [birth year], 71 y.o.   MRN: [id number]  HPI The patient complains about chronic pain on the top of his head and behind his right ear. The patient also complains about some neck pain.  The problem has been stable. The patient states that his pain medication is controlling his pain.He reports that he has followed up with his neurosurgeon Dr. Channing Mutters on the 21st of August, the patient is very frustrated, because Dr. Channing Mutters just told him that he should see him again in 6 month, and that he is not considering any further surgery at this point. I referred him to another neurosurgeon for a second opinion, he will see Dr. Danielle Dess on Nov. 6th.  Pain Inventory Average Pain 9 Pain Right Now 9 My pain is constant  In the last 24 hours, has pain interfered with the following? General activity 8 Relation with others 8 Enjoyment of life 8 What TIME of day is your pain at its worst? morning Sleep (in general) Poor  Pain is worse with: walking and bending Pain improves with: rest and medication Relief from Meds: 5  Mobility walk without assistance use a cane how many minutes can you walk? 15-20 do you drive?  yes  Function disabled: date disabled   Neuro/Psych No problems in this area  Prior Studies Any changes since last visit?  no  Physicians involved in your care Any changes since last visit?  no   Family History  Problem Relation Age of Onset  . Cancer Mother 30    cervical cancer  . Cancer Father 44    lung  cancer   History   Social History  . Marital Status: Single    Spouse Name: N/A    Number of Children: N/A  . Years of Education: N/A   Social History Main Topics  . Smoking status: Former Games developer  . Smokeless tobacco: Former Neurosurgeon    Quit date: 02/12/1985  . Alcohol Use: No  . Drug Use: No  . Sexually Active: None   Other Topics Concern  . None   Social History Narrative  . None   Past Surgical History    Procedure Date  . Spine surgery     neck fusion  . Joint replacement     both knees   Past Medical History  Diagnosis Date  . Cervical spondylosis without myelopathy   . Lumbosacral spondylosis without myelopathy   . Primary localized osteoarthrosis, lower leg   . Primary localized osteoarthrosis, shoulder region   . Cervicalgia   . Degeneration of thoracic or thoracolumbar intervertebral disc   . Depression   . Chronic pain syndrome    BP 150/77  Pulse 65  Resp 14  Ht 5\' 8"  (1.727 m)  Wt 250 lb (113.399 kg)  BMI 38.01 kg/m2  SpO2 95%    Review of Systems  HENT: Positive for neck pain.   Musculoskeletal:       Knee pain, both  All other systems reviewed and are negative.       Objective:   Physical Exam Constitutional: He is oriented to person, place, and time. He appears well-developed and well-nourished.  obese  HENT:  Head: Normocephalic.  Musculoskeletal: He exhibits tenderness.  Neurological: He is alert and oriented to person, place, and time.  Skin: Skin is warm and dry.  Psychiatric: He has a normal mood and affect.  Symmetric normal motor tone is noted throughout.  Normal muscle bulk. Muscle testing reveals 5/5 muscle strength of the upper extremity, except his right UE 4+/5, and 5/5 of the lower extremity. ROM of cervical spine is severely Restricted, basically rotation to both sides is non existent. Fine motor movements are normal in both hands.  DTR in the upper and lower extremity are present and symmetric 2+. No clonus is noted.  Patient arises from chair with difficulty. Wide based gait with a cane, difficulty with balance.         Assessment & Plan:  1. Chronic cervicalgia status post multiple cervical spine surgeries.  2. Spondylolisthesis C6-7  PLAN:  He will resume home exercises. He will continue his walking program.  3. Refilled OxyContin 30 mg, 2 tablets q.8 hours #180 and oxycodone IR 10 mg, #90  4. Pt saw Dr. Channing Mutters to discuss his MRI  on 08/06/2012 , the patient states, that Dr. Channing Mutters is not considering other treatment than pain management at this point. Patient is considering getting a second opinion, I referred him to another neurosurgeon,he will see Dr. Danielle Dess for a second opinion on Nov. 6th. The patient was short on his pain medication,( approximately 3 days ), he has no explanation for this, he states, that he is taking the medication as prescribed. He reports, that there was some trouble to get his medication last time, the one pharmacy did not have enough tablets, he had to go somewhere else, and did not count his meds when he got them. I advised the patient to take his meds as prescribed, and that he should stretch out his medication, so that he does not run out of them completely before he gets his refill.  Follow up in one month

## 2012-10-16 NOTE — Patient Instructions (Signed)
Continue with your exercises as tolerated 

## 2012-10-17 ENCOUNTER — Encounter: Payer: Medicare Other | Admitting: Physical Medicine and Rehabilitation

## 2012-10-22 DIAGNOSIS — M4712 Other spondylosis with myelopathy, cervical region: Secondary | ICD-10-CM | POA: Diagnosis not present

## 2012-10-24 ENCOUNTER — Telehealth: Payer: Self-pay

## 2012-10-24 NOTE — Telephone Encounter (Signed)
Lm advising patient [facility] has medication.

## 2012-10-24 NOTE — Telephone Encounter (Signed)
Patient states he has been to 6 different pharmacies trying to refill his Oxycontin 30mg . None of the pharmacies have this in stock.

## 2012-11-12 ENCOUNTER — Encounter: Payer: Self-pay | Admitting: Physical Medicine and Rehabilitation

## 2012-11-12 ENCOUNTER — Encounter
Payer: Medicare Other | Attending: Physical Medicine and Rehabilitation | Admitting: Physical Medicine and Rehabilitation

## 2012-11-12 VITALS — BP 161/84 | HR 77 | Resp 14 | Ht 68.0 in | Wt 252.0 lb

## 2012-11-12 DIAGNOSIS — Z96659 Presence of unspecified artificial knee joint: Secondary | ICD-10-CM | POA: Insufficient documentation

## 2012-11-12 DIAGNOSIS — M961 Postlaminectomy syndrome, not elsewhere classified: Secondary | ICD-10-CM | POA: Diagnosis not present

## 2012-11-12 DIAGNOSIS — M542 Cervicalgia: Secondary | ICD-10-CM | POA: Diagnosis not present

## 2012-11-12 DIAGNOSIS — Q762 Congenital spondylolisthesis: Secondary | ICD-10-CM | POA: Insufficient documentation

## 2012-11-12 DIAGNOSIS — R51 Headache: Secondary | ICD-10-CM | POA: Insufficient documentation

## 2012-11-12 DIAGNOSIS — G8929 Other chronic pain: Secondary | ICD-10-CM | POA: Diagnosis not present

## 2012-11-12 MED ORDER — OXYCODONE HCL 5 MG PO CAPS: 5.0000 mg | ORAL_CAPSULE | Freq: Four times a day (QID) | ORAL | Status: DC | PRN

## 2012-11-12 MED ORDER — OXYCODONE HCL ER 30 MG PO T12A: 2.0000 | EXTENDED_RELEASE_TABLET | Freq: Three times a day (TID) | ORAL | Status: DC

## 2012-11-12 NOTE — Patient Instructions (Signed)
Stay as active as pain permits 

## 2012-11-12 NOTE — Progress Notes (Signed)
Subjective:    Patient ID: [name], male    DOB: 1941/05/02, 71 y.o.   MRN: [id number]  HPI The patient complains about chronic pain on the top of his head and behind his right ear. The patient also complains about some neck pain.  The problem has been stable. The patient states that his pain medication is controlling his pain.He reports that he has followed up with his neurosurgeon Dr. Channing Mutters on the 21st of August, the patient is very frustrated, because Dr. Channing Mutters just told him that he should see him again in 6 month, and that he is not considering any further surgery at this point. I referred him to another neurosurgeon for a second opinion, he will saw Dr. Danielle Dess on Nov. 6th., who told the patient that he also thinks, that he is not a surgical candidate at this time.  Pain Inventory Average Pain 9 Pain Right Now 7 My pain is constant  In the last 24 hours, has pain interfered with the following? General activity 8 Relation with others 9 Enjoyment of life 9 What TIME of day is your pain at its worst? all the time Sleep (in general) Poor  Pain is worse with: some activites Pain improves with: rest and medication Relief from Meds: 4  Mobility how many minutes can you walk? 10 ability to climb steps?  no do you drive?  yes Do you have any goals in this area?  yes  Function disabled: date disabled  I need assistance with the following:  household duties and shopping  Neuro/Psych loss of taste or smell  Prior Studies Any changes since last visit?  no  Physicians involved in your care Any changes since last visit?  no   Family History  Problem Relation Age of Onset  . Cancer Mother 30    cervical cancer  . Cancer Father 96    lung  cancer   History   Social History  . Marital Status: Single    Spouse Name: N/A    Number of Children: N/A  . Years of Education: N/A   Social History Main Topics  . Smoking status: Former Games developer  . Smokeless tobacco: Former Neurosurgeon      Quit date: 02/12/1985  . Alcohol Use: No  . Drug Use: No  . Sexually Active: None   Other Topics Concern  . None   Social History Narrative  . None   Past Surgical History  Procedure Date  . Spine surgery     neck fusion  . Joint replacement     both knees   Past Medical History  Diagnosis Date  . Cervical spondylosis without myelopathy   . Lumbosacral spondylosis without myelopathy   . Primary localized osteoarthrosis, lower leg   . Primary localized osteoarthrosis, shoulder region   . Cervicalgia   . Degeneration of thoracic or thoracolumbar intervertebral disc   . Depression   . Chronic pain syndrome    BP 161/84  Pulse 77  Resp 14  Ht 5\' 8"  (1.727 m)  Wt 252 lb (114.306 kg)  BMI 38.32 kg/m2  SpO2 96%     Review of Systems  Constitutional: Positive for appetite change.  HENT: Positive for neck pain.   Musculoskeletal: Positive for myalgias, back pain and arthralgias.  All other systems reviewed and are negative.       Objective:   Physical Exam Constitutional: He is oriented to person, place, and time. He appears well-developed and well-nourished.  obese  HENT:  Head: Normocephalic.  Musculoskeletal: He exhibits tenderness.  Neurological: He is alert and oriented to person, place, and time.  Skin: Skin is warm and dry.  Psychiatric: He has a normal mood and affect.  Symmetric normal motor tone is noted throughout. Normal muscle bulk. Muscle testing reveals 5/5 muscle strength of the upper extremity, except his right UE 4+/5, and 5/5 of the lower extremity. ROM of cervical spine is severely Restricted, basically rotation to both sides is non existent. Fine motor movements are normal in both hands.  DTR in the upper and lower extremity are present and symmetric 2+. No clonus is noted.  Patient arises from chair with difficulty. Wide based gait with a cane, difficulty with balance.         Assessment & Plan:  1. Chronic cervicalgia status post  multiple cervical spine surgeries.  2. Spondylolisthesis C6-7  PLAN:  He will resume home exercises. He will continue his walking program.  3. Refilled OxyContin 30 mg, 2 tablets q.8 hours #180 and oxycodone IR 10 mg, #90  4. Pt saw Dr. Channing Mutters to discuss his MRI on 08/06/2012 , the patient states, that Dr. Channing Mutters is not considering other treatment than pain management at this point. Patient is considering getting a second opinion, I referred him to another neurosurgeon,he saw Dr. Danielle Dess for a second opinion on Nov. 6th., who also determined, that he is not a surgical candidate at this tim  The patient was short on his pain medication,( approximately 3 days ), he has no explanation for this, he states, that he is taking the medication as prescribed. He reports, that there was some trouble to get his medication last time, the one pharmacy did not have enough tablets, he had to go somewhere else, and did not count his meds when he got them. I advised the patient to take his meds as prescribed, and that he should stretch out his medication, so that he does not run out of them completely before he gets his refill. Patient brought some papers to fill out until next visit. Follow up in one month

## 2012-11-20 ENCOUNTER — Other Ambulatory Visit: Payer: Self-pay | Admitting: Physical Medicine & Rehabilitation

## 2012-12-01 DIAGNOSIS — IMO0002 Reserved for concepts with insufficient information to code with codable children: Secondary | ICD-10-CM | POA: Diagnosis not present

## 2012-12-15 ENCOUNTER — Encounter
Payer: Medicare Other | Attending: Physical Medicine and Rehabilitation | Admitting: Physical Medicine and Rehabilitation

## 2012-12-15 ENCOUNTER — Encounter: Payer: Self-pay | Admitting: Physical Medicine and Rehabilitation

## 2012-12-15 VITALS — BP 177/69 | HR 75 | Resp 14 | Ht 68.0 in | Wt 225.0 lb

## 2012-12-15 DIAGNOSIS — M25569 Pain in unspecified knee: Secondary | ICD-10-CM

## 2012-12-15 DIAGNOSIS — R51 Headache: Secondary | ICD-10-CM | POA: Insufficient documentation

## 2012-12-15 DIAGNOSIS — Z5181 Encounter for therapeutic drug level monitoring: Secondary | ICD-10-CM | POA: Diagnosis not present

## 2012-12-15 DIAGNOSIS — M542 Cervicalgia: Secondary | ICD-10-CM | POA: Diagnosis not present

## 2012-12-15 DIAGNOSIS — G8929 Other chronic pain: Secondary | ICD-10-CM | POA: Insufficient documentation

## 2012-12-15 DIAGNOSIS — Q762 Congenital spondylolisthesis: Secondary | ICD-10-CM | POA: Diagnosis not present

## 2012-12-15 DIAGNOSIS — M4804 Spinal stenosis, thoracic region: Secondary | ICD-10-CM | POA: Diagnosis not present

## 2012-12-15 DIAGNOSIS — M4802 Spinal stenosis, cervical region: Secondary | ICD-10-CM | POA: Insufficient documentation

## 2012-12-15 DIAGNOSIS — Z96659 Presence of unspecified artificial knee joint: Secondary | ICD-10-CM | POA: Diagnosis not present

## 2012-12-15 DIAGNOSIS — Z981 Arthrodesis status: Secondary | ICD-10-CM | POA: Diagnosis not present

## 2012-12-15 DIAGNOSIS — M961 Postlaminectomy syndrome, not elsewhere classified: Secondary | ICD-10-CM

## 2012-12-15 MED ORDER — OXYCODONE HCL ER 30 MG PO T12A: 2.0000 | EXTENDED_RELEASE_TABLET | Freq: Three times a day (TID) | ORAL | Status: DC

## 2012-12-15 MED ORDER — OXYCODONE HCL 5 MG PO CAPS: 5.0000 mg | ORAL_CAPSULE | Freq: Four times a day (QID) | ORAL | Status: DC | PRN

## 2012-12-15 NOTE — Patient Instructions (Signed)
Continue with your exercise program and staying active as pain permits.

## 2012-12-15 NOTE — Progress Notes (Signed)
Subjective:    Patient ID: [name], male    DOB: Jan 27, 1941, 71 y.o.   MRN: [id number]  HPI The patient complains about chronic pain on the top of his head and behind his right ear. The patient also complains about some neck pain.  The problem has been stable. The patient states that his pain medication is controlling his pain.He reports that he has followed up with his neurosurgeon Dr. Channing Mutters on the 21st of August, the patient is very frustrated, because Dr. Channing Mutters just told him that he should see him again in 6 month, and that he is not considering any further surgery at this point. I referred him to another neurosurgeon for a second opinion, he will saw Dr. Danielle Dess on Nov. 6th., who told the patient that he also thinks, that he is not a surgical candidate at this time.  Pain Inventory Average Pain 8 Pain Right Now 7 My pain is aching  In the last 24 hours, has pain interfered with the following? General activity 9 Relation with others 9 Enjoyment of life 8 What TIME of day is your pain at its worst? all the time Sleep (in general) Fair  Pain is worse with: walking Pain improves with: rest and medication Relief from Meds: 5  Mobility use a cane ability to climb steps?  yes do you drive?  yes Do you have any goals in this area?  yes  Function retired I need assistance with the following:  household duties  Neuro/Psych numbness  Prior Studies Any changes since last visit?  no  Physicians involved in your care Any changes since last visit?  no   Family History  Problem Relation Age of Onset  . Cancer Mother 30    cervical cancer  . Cancer Father 60    lung  cancer   History   Social History  . Marital Status: Single    Spouse Name: N/A    Number of Children: N/A  . Years of Education: N/A   Social History Main Topics  . Smoking status: Former Games developer  . Smokeless tobacco: Former Neurosurgeon    Quit date: 02/12/1985  . Alcohol Use: No  . Drug Use: No  . Sexually  Active: None   Other Topics Concern  . None   Social History Narrative  . None   Past Surgical History  Procedure Date  . Spine surgery     neck fusion  . Joint replacement     both knees   Past Medical History  Diagnosis Date  . Cervical spondylosis without myelopathy   . Lumbosacral spondylosis without myelopathy   . Primary localized osteoarthrosis, lower leg   . Primary localized osteoarthrosis, shoulder region   . Cervicalgia   . Degeneration of thoracic or thoracolumbar intervertebral disc   . Depression   . Chronic pain syndrome    BP 177/69  Pulse 75  Resp 14  Ht 5\' 8"  (1.727 m)  Wt 225 lb (102.059 kg)  BMI 34.21 kg/m2  SpO2 93%    Review of Systems  Cardiovascular: Positive for leg swelling.  Gastrointestinal: Positive for abdominal pain.  Musculoskeletal: Positive for back pain and gait problem.  Neurological: Positive for numbness.  All other systems reviewed and are negative.       Objective:   Physical Exam Constitutional: He is oriented to person, place, and time. He appears well-developed and well-nourished.  obese  HENT:  Head: Normocephalic.  Musculoskeletal: He exhibits tenderness.  Neurological: He  is alert and oriented to person, place, and time.  Skin: Skin is warm and dry.  Psychiatric: He has a normal mood and affect.  Symmetric normal motor tone is noted throughout. Normal muscle bulk. Muscle testing reveals 5/5 muscle strength of the upper extremity, except his right UE 4+/5, and 5/5 of the lower extremity. ROM of cervical spine is severely Restricted, basically rotation to both sides is non existent. Fine motor movements are normal in both hands.  DTR in the upper and lower extremity are present and symmetric 2+. No clonus is noted.  Patient arises from chair with difficulty. Wide based gait with a cane, difficulty with balance.         Assessment & Plan:  1. Chronic cervicalgia status post multiple cervical spine surgeries.    2. Spondylolisthesis C6-7, MRI from Feb 2013 showed : Chronic myelomalacia in the spinal cord at C6-7. 6 mm anterior slip C6-7 with mild spinal stenosis.  Spondylosis and mild spinal stenosis at C7-T1 and T1-2.  Increased fluid signal in the disc space at C5-6 suggesting  possible incomplete posterior fusion at this level, as suggested on  a prior CT.  PLAN:  He will resume home exercises. He will continue his walking program.  3. Refill OxyContin 30 mg, 2 tablets q.8 hours #180 and oxycodone IR 10 mg, #90 , when patient gives a sample for a UDS . 4. Pt saw Dr. Channing Mutters to discuss his MRI on 08/06/2012 , the patient states, that Dr. Channing Mutters is not considering other treatment than pain management at this point. Patient is considering getting a second opinion, I referred him to another neurosurgeon,he saw Dr. Danielle Dess for a second opinion on Nov. 6th., who also determined, that he is not a surgical candidate at this time The patient was short on his pain medication,( approximately 3 days ), he has no explanation for this, he states, that he is taking the medication as prescribed. He reports, that there was some trouble to get his medication last time, the one pharmacy did not have enough tablets, he had to go somewhere else, and did not count his meds when he got them. I advised the patient to take his meds as prescribed, and that he should stretch out his medication, so that he does not run out of them completely before he gets his refill. Patient brought some papers to fill out until next visit.  Follow up in one month

## 2012-12-16 ENCOUNTER — Telehealth: Payer: Self-pay

## 2012-12-16 ENCOUNTER — Other Ambulatory Visit: Payer: Self-pay | Admitting: Physical Medicine and Rehabilitation

## 2012-12-16 NOTE — Telephone Encounter (Signed)
Patient called to follow up on letter to insurance company he requested a month ago.  Please call.

## 2012-12-18 NOTE — Telephone Encounter (Signed)
Filled out today

## 2012-12-18 NOTE — Telephone Encounter (Signed)
Faxed to his pharmacy, and message left for him on personal voicemail.

## 2012-12-18 NOTE — Telephone Encounter (Signed)
Do you want to refill his valium?

## 2012-12-18 NOTE — Telephone Encounter (Signed)
Mr Ballin is calling about the letter he gave you from his insurance company about his oxy. Do you still have that or has it been dealt with?

## 2012-12-19 DIAGNOSIS — IMO0002 Reserved for concepts with insufficient information to code with codable children: Secondary | ICD-10-CM | POA: Diagnosis not present

## 2012-12-19 DIAGNOSIS — R070 Pain in throat: Secondary | ICD-10-CM | POA: Diagnosis not present

## 2012-12-23 DIAGNOSIS — R490 Dysphonia: Secondary | ICD-10-CM | POA: Diagnosis not present

## 2012-12-23 DIAGNOSIS — R1310 Dysphagia, unspecified: Secondary | ICD-10-CM | POA: Diagnosis not present

## 2012-12-25 ENCOUNTER — Emergency Department (HOSPITAL_COMMUNITY)
Admission: EM | Admit: 2012-12-25 | Discharge: 2012-12-25 | Disposition: A | Discharge status: Home / Self Care | Payer: Medicare Other | Attending: Emergency Medicine | Admitting: Emergency Medicine

## 2012-12-25 ENCOUNTER — Encounter (HOSPITAL_COMMUNITY): Payer: Self-pay | Admitting: *Deleted

## 2012-12-25 ENCOUNTER — Emergency Department (HOSPITAL_COMMUNITY): Payer: Medicare Other

## 2012-12-25 DIAGNOSIS — T1490XA Injury, unspecified, initial encounter: Secondary | ICD-10-CM | POA: Diagnosis not present

## 2012-12-25 DIAGNOSIS — W010XXA Fall on same level from slipping, tripping and stumbling without subsequent striking against object, initial encounter: Secondary | ICD-10-CM | POA: Insufficient documentation

## 2012-12-25 DIAGNOSIS — S0180XA Unspecified open wound of other part of head, initial encounter: Secondary | ICD-10-CM | POA: Insufficient documentation

## 2012-12-25 DIAGNOSIS — F329 Major depressive disorder, single episode, unspecified: Secondary | ICD-10-CM | POA: Insufficient documentation

## 2012-12-25 DIAGNOSIS — Z8781 Personal history of (healed) traumatic fracture: Secondary | ICD-10-CM | POA: Insufficient documentation

## 2012-12-25 DIAGNOSIS — Z7982 Long term (current) use of aspirin: Secondary | ICD-10-CM | POA: Diagnosis not present

## 2012-12-25 DIAGNOSIS — S1190XA Unspecified open wound of unspecified part of neck, initial encounter: Secondary | ICD-10-CM | POA: Diagnosis not present

## 2012-12-25 DIAGNOSIS — G894 Chronic pain syndrome: Secondary | ICD-10-CM | POA: Diagnosis not present

## 2012-12-25 DIAGNOSIS — Z79899 Other long term (current) drug therapy: Secondary | ICD-10-CM | POA: Insufficient documentation

## 2012-12-25 DIAGNOSIS — S0100XA Unspecified open wound of scalp, initial encounter: Secondary | ICD-10-CM | POA: Diagnosis not present

## 2012-12-25 DIAGNOSIS — Z87891 Personal history of nicotine dependence: Secondary | ICD-10-CM | POA: Insufficient documentation

## 2012-12-25 DIAGNOSIS — Z8739 Personal history of other diseases of the musculoskeletal system and connective tissue: Secondary | ICD-10-CM | POA: Diagnosis not present

## 2012-12-25 DIAGNOSIS — IMO0002 Reserved for concepts with insufficient information to code with codable children: Secondary | ICD-10-CM | POA: Diagnosis not present

## 2012-12-25 DIAGNOSIS — Y939 Activity, unspecified: Secondary | ICD-10-CM | POA: Insufficient documentation

## 2012-12-25 DIAGNOSIS — S0990XA Unspecified injury of head, initial encounter: Secondary | ICD-10-CM

## 2012-12-25 DIAGNOSIS — S0181XA Laceration without foreign body of other part of head, initial encounter: Secondary | ICD-10-CM

## 2012-12-25 DIAGNOSIS — W19XXXA Unspecified fall, initial encounter: Secondary | ICD-10-CM

## 2012-12-25 DIAGNOSIS — F3289 Other specified depressive episodes: Secondary | ICD-10-CM | POA: Insufficient documentation

## 2012-12-25 DIAGNOSIS — S0190XA Unspecified open wound of unspecified part of head, initial encounter: Secondary | ICD-10-CM | POA: Diagnosis not present

## 2012-12-25 DIAGNOSIS — Y929 Unspecified place or not applicable: Secondary | ICD-10-CM | POA: Insufficient documentation

## 2012-12-25 MED ORDER — LORAZEPAM 2 MG/ML IJ SOLN
1.0000 mg | Freq: Once | INTRAMUSCULAR | Status: DC
  Filled 2012-12-25: qty 1

## 2012-12-25 NOTE — ED Notes (Signed)
Pt took pain medication from home, Silverio Lay, MD aware

## 2012-12-25 NOTE — ED Notes (Signed)
Pt in from home via [facility], per EMS pt fell while walking up wheelchair ramp that has L shape metal outline along the ramp, the pt tripped & fell hitting head with no LOC, pt arrives to ED with laceration L side of head, bleeding controlled, pt A&O x4, follows commands, speaks in complete sentences, pt denies SOB & CP in route, pt hx of neck fracture with sx performed here

## 2012-12-25 NOTE — ED Provider Notes (Signed)
History     CSN: [id number]  Arrival date & time 12/25/12  1404   First MD Initiated Contact with Patient 12/25/12 1413      Chief Complaint  Patient presents with  . Fall    (Consider location/radiation/quality/duration/timing/severity/associated sxs/prior treatment) The history is provided by the patient.  DICKEY CAAMANO is a 72 y.o. male hx of cervical fracture s/p fusion, lumbar fusion here with s/p fall. He was walking and his foot got caught and tripped and fell landed on the left side of his face. No syncope or chest pain. He was noted to have a laceration on left forehead head and his tetanus is up-to-date. Not on coumadin or plavix, just asa 81 mg daily.    Past Medical History  Diagnosis Date  . Cervical spondylosis without myelopathy   . Lumbosacral spondylosis without myelopathy   . Primary localized osteoarthrosis, lower leg   . Primary localized osteoarthrosis, shoulder region   . Cervicalgia   . Degeneration of thoracic or thoracolumbar intervertebral disc   . Depression   . Chronic pain syndrome     Past Surgical History  Procedure Date  . Spine surgery     neck fusion  . Joint replacement     both knees    Family History  Problem Relation Age of Onset  . Cancer Mother 30    cervical cancer  . Cancer Father 43    lung  cancer    History  Substance Use Topics  . Smoking status: Former Games developer  . Smokeless tobacco: Former Neurosurgeon    Quit date: 02/12/1985  . Alcohol Use: No      Review of Systems  Skin: Positive for wound.  Neurological: Positive for headaches.  All other systems reviewed and are negative.    Allergies  Zanaflex  Home Medications   Current Outpatient Rx  Name  Route  Sig  Dispense  Refill  . ASPIRIN 81 MG PO TABS   Oral   Take 81 mg by mouth daily.         . BUPROPION HCL ER (SR) 150 MG PO TB12   Oral   Take 150 mg by mouth 2 (two) times daily.           Marland Kitchen DIAZEPAM 5 MG PO TABS   Oral   Take 1 tablet (5 mg  total) by mouth every 6 (six) hours as needed. anxiety   30 tablet   1   . DICLOFENAC SODIUM 1 % TD GEL   Topical   Apply 1 application topically 3 (three) times daily. Applies to knees         . DULOXETINE HCL 60 MG PO CPEP   Oral   Take 60 mg by mouth daily.         . NEBIVOLOL HCL 5 MG PO TABS   Oral   Take 5 mg by mouth daily.         Marland Kitchen OMEPRAZOLE 20 MG PO CPDR   Oral   Take 1 capsule by mouth Daily.         . OXYCODONE HCL 5 MG PO CAPS   Oral   Take 1 capsule (5 mg total) by mouth every 6 (six) hours as needed.   90 capsule   0   . OXYCODONE HCL ER 30 MG PO TB12   Oral   Take 2 tablets (60 mg total) by mouth every 8 (eight) hours.   180 tablet   0   .  SIMVASTATIN 10 MG PO TABS   Oral   Take 10 mg by mouth at bedtime.         . SUCRALFATE 1 G PO TABS   Oral   Take 1 g by mouth 2 (two) times daily.           BP 166/80  Pulse 63  Temp 98.2 F (36.8 C) (Oral)  Resp 18  SpO2 94%  Physical Exam  Nursing note and vitals reviewed. Constitutional: He is oriented to person, place, and time. He appears well-developed.       Boarded and collared.   HENT:  Head: Normocephalic.    Mouth/Throat: Oropharynx is clear and moist.       2 inch laceration on L forehead that is semicircular with a skin flap.   Eyes: Conjunctivae normal are normal. Pupils are equal, round, and reactive to light.  Neck: Normal range of motion. Neck supple.       No midline tenderness. + cervical fusion scar well healed.   Cardiovascular: Normal rate, regular rhythm and normal heart sounds.   Pulmonary/Chest: Effort normal and breath sounds normal. No respiratory distress. He has no wheezes.  Abdominal: Soft. Bowel sounds are normal. He exhibits no distension. There is no tenderness. There is no rebound.  Musculoskeletal: Normal range of motion.  Neurological: He is alert and oriented to person, place, and time. No cranial nerve deficit. Coordination normal.       Nl  strength and sensation throughout.   Skin: Skin is warm and dry.  Psychiatric: He has a normal mood and affect. His behavior is normal. Judgment and thought content normal.    ED Course  Procedures (including critical care time)  Labs Reviewed - No data to display Ct Head Wo Contrast  12/25/2012  *RADIOLOGY REPORT*  Clinical Data:  Fall striking head, no loss of consciousness, left side laceration, past history of neck fracture with surgery  CT HEAD WITHOUT CONTRAST CT CERVICAL SPINE WITHOUT CONTRAST  Technique:  Multidetector CT imaging of the head and cervical spine was performed following the standard protocol without intravenous contrast.  Multiplanar CT image reconstructions of the cervical spine were also generated.  Comparison:  CT head 07/03/2012, CT cervical myelogram 12/05/2010  CT HEAD  Findings: Beam hardening artifacts from orthopedic hardware at occiput, obscuring portions of the posterior fossa. Generalized atrophy. Normal ventricular morphology. No midline shift or mass effect. No definite intracranial hemorrhage, mass lesion or evidence of acute infarction. Small left frontal scalp hematoma. Slight nasal septal deviation to the right. Bones unremarkable. Visualized paranasal sinuses clear.  IMPRESSION: No acute intracranial abnormalities.  CT CERVICAL SPINE  Findings: Visualized skull base intact. Bones demineralized. Prior posterior fusion occiput through C3. Additional posterior fusion C4 through T1. Prior anterior fusion C6-C7. Chronic anterolisthesis at C6-C7 7 mm diameter, grossly unchanged. Cyst within odontoid process unchanged. Deformity of the odontoid process and anterior C1 question sequela of prior trauma. Prevertebral soft tissues normal thickness. Scattered disc space narrowing and endplate spur formation upper thoracic spine. Hardware appears grossly intact. No definite acute fracture, subluxation or bone destruction. Visualized lung apices clear. Scattered atherosclerotic  calcifications.  IMPRESSION: Extensive postsurgical degenerative changes of the cervical spine with question of post-traumatic deformities of C1 and C2. No definite acute cervical spine abnormalities identified.   Original Report Authenticated By: Ulyses Southward, M.D.    Ct Cervical Spine Wo Contrast  12/25/2012  *RADIOLOGY REPORT*  Clinical Data:  Fall striking head, no loss of consciousness,  left side laceration, past history of neck fracture with surgery  CT HEAD WITHOUT CONTRAST CT CERVICAL SPINE WITHOUT CONTRAST  Technique:  Multidetector CT imaging of the head and cervical spine was performed following the standard protocol without intravenous contrast.  Multiplanar CT image reconstructions of the cervical spine were also generated.  Comparison:  CT head 07/03/2012, CT cervical myelogram 12/05/2010  CT HEAD  Findings: Beam hardening artifacts from orthopedic hardware at occiput, obscuring portions of the posterior fossa. Generalized atrophy. Normal ventricular morphology. No midline shift or mass effect. No definite intracranial hemorrhage, mass lesion or evidence of acute infarction. Small left frontal scalp hematoma. Slight nasal septal deviation to the right. Bones unremarkable. Visualized paranasal sinuses clear.  IMPRESSION: No acute intracranial abnormalities.  CT CERVICAL SPINE  Findings: Visualized skull base intact. Bones demineralized. Prior posterior fusion occiput through C3. Additional posterior fusion C4 through T1. Prior anterior fusion C6-C7. Chronic anterolisthesis at C6-C7 7 mm diameter, grossly unchanged. Cyst within odontoid process unchanged. Deformity of the odontoid process and anterior C1 question sequela of prior trauma. Prevertebral soft tissues normal thickness. Scattered disc space narrowing and endplate spur formation upper thoracic spine. Hardware appears grossly intact. No definite acute fracture, subluxation or bone destruction. Visualized lung apices clear. Scattered  atherosclerotic calcifications.  IMPRESSION: Extensive postsurgical degenerative changes of the cervical spine with question of post-traumatic deformities of C1 and C2. No definite acute cervical spine abnormalities identified.   Original Report Authenticated By: Ulyses Southward, M.D.      No diagnosis found.    MDM  JAYZIAH BANKHEAD is a 72 y.o. male here s/p fall with laceration. Will get CT head/cervical spine. Will suture laceration and reassess. Patient took his own pain meds in the ED. Tetanus is up to date.   3:25 PM CT head nl. CT cervical spine showed post surgical changes, C1-C2 showed old fractures. No acute fractures on current CT. The PA will suture the laceration. Please see separate note. I cleared his cervical collar but he said that his neck has been stiff for awhile. I offered to d/c home on soft brace but he doesn't want it and he has it at home and doesn't like to wear it. I recommend that he f/u with his neurosurgeon. Return precautions given. He will return in 5-7 days for suture removal.        Richardean Canal, MD 12/25/12 [phone]

## 2012-12-25 NOTE — ED Notes (Signed)
Pt removed from LSB by Silverio Lay, MD, pt remains in c collar

## 2012-12-25 NOTE — ED Provider Notes (Signed)
LACERATION REPAIR Performed by: Wynetta Emery Authorized by: Wynetta Emery Consent: Verbal consent obtained. Risks and benefits: risks, benefits and alternatives were discussed Consent given by: patient Patient identity confirmed: Wrist band  Prepped and Draped in normal sterile fashion  Tetanus: Up to date  Laceration Location: Left for head  Laceration Length: 6 cm  Anesthesia: Local   Local anesthetic: 2% with epinephrine  Anesthetic total: 8 ml  Irrigation method: syringe  Amount of cleaning: copious   Wound explored to depth in good light on a bloodless field with no foreign bodies seen or palpated.   Skin closure: 5-0 polypropylene   Number of sutures: 13   Technique: Simple interrupted   Patient tolerance: Patient tolerated the procedure well with no immediate complications.  Antibx ointment applied. Instructions for care discussed verbally and patient provided with additional written instructions for homecare and f/u.  Wynetta Emery, PA-C 12/25/12 1706

## 2012-12-26 NOTE — ED Provider Notes (Signed)
Medical screening examination/treatment/procedure(s) were conducted as a shared visit with non-physician practitioner(s) and myself.  I personally evaluated the patient during the encounter  See my note for the H&P. She actually did 10 5-0 sutures and 3 4-0 sutures (for stronger closure).   Richardean Canal, MD 12/26/12 [phone]

## 2012-12-29 DIAGNOSIS — S0190XA Unspecified open wound of unspecified part of head, initial encounter: Secondary | ICD-10-CM | POA: Diagnosis not present

## 2012-12-31 ENCOUNTER — Telehealth: Payer: Self-pay

## 2012-12-31 DIAGNOSIS — M545 Low back pain: Secondary | ICD-10-CM | POA: Diagnosis not present

## 2012-12-31 DIAGNOSIS — I10 Essential (primary) hypertension: Secondary | ICD-10-CM | POA: Diagnosis not present

## 2012-12-31 DIAGNOSIS — S0190XA Unspecified open wound of unspecified part of head, initial encounter: Secondary | ICD-10-CM | POA: Diagnosis not present

## 2012-12-31 DIAGNOSIS — J312 Chronic pharyngitis: Secondary | ICD-10-CM | POA: Diagnosis not present

## 2012-12-31 NOTE — Telephone Encounter (Signed)
Patient stopped by the office with a script from [facility] for valium.  He has recently fallen and was given this.  He does have a contract with our office and we have prescribed this in the past, so he wanted to know if we could just prescribe it again?  Please advise.

## 2013-01-01 NOTE — Telephone Encounter (Signed)
Left message advising patient we would not fill valium due to fall risk.

## 2013-01-01 NOTE — Telephone Encounter (Signed)
I will not prescribe, with the narcotics he already has a fall risk, also with his diagnosis, the valium might increase his fall risk, even more

## 2013-01-07 DIAGNOSIS — Z4802 Encounter for removal of sutures: Secondary | ICD-10-CM | POA: Diagnosis not present

## 2013-01-13 ENCOUNTER — Encounter
Payer: Medicare Other | Attending: Physical Medicine and Rehabilitation | Admitting: Physical Medicine and Rehabilitation

## 2013-01-13 DIAGNOSIS — Q762 Congenital spondylolisthesis: Secondary | ICD-10-CM | POA: Insufficient documentation

## 2013-01-13 DIAGNOSIS — Z96659 Presence of unspecified artificial knee joint: Secondary | ICD-10-CM | POA: Insufficient documentation

## 2013-01-13 DIAGNOSIS — M47814 Spondylosis without myelopathy or radiculopathy, thoracic region: Secondary | ICD-10-CM | POA: Insufficient documentation

## 2013-01-13 DIAGNOSIS — R51 Headache: Secondary | ICD-10-CM | POA: Insufficient documentation

## 2013-01-13 DIAGNOSIS — M542 Cervicalgia: Secondary | ICD-10-CM | POA: Insufficient documentation

## 2013-01-13 DIAGNOSIS — M4712 Other spondylosis with myelopathy, cervical region: Secondary | ICD-10-CM | POA: Insufficient documentation

## 2013-01-13 DIAGNOSIS — G8929 Other chronic pain: Secondary | ICD-10-CM | POA: Insufficient documentation

## 2013-01-14 ENCOUNTER — Encounter: Payer: Self-pay | Admitting: Physical Medicine and Rehabilitation

## 2013-01-14 ENCOUNTER — Encounter (HOSPITAL_BASED_OUTPATIENT_CLINIC_OR_DEPARTMENT_OTHER): Payer: Medicare Other | Admitting: Physical Medicine and Rehabilitation

## 2013-01-14 VITALS — BP 160/77 | HR 54 | Resp 14 | Ht 68.0 in | Wt 232.0 lb

## 2013-01-14 DIAGNOSIS — G8929 Other chronic pain: Secondary | ICD-10-CM | POA: Diagnosis not present

## 2013-01-14 DIAGNOSIS — Q762 Congenital spondylolisthesis: Secondary | ICD-10-CM | POA: Diagnosis not present

## 2013-01-14 DIAGNOSIS — M47814 Spondylosis without myelopathy or radiculopathy, thoracic region: Secondary | ICD-10-CM | POA: Diagnosis not present

## 2013-01-14 DIAGNOSIS — Z96659 Presence of unspecified artificial knee joint: Secondary | ICD-10-CM | POA: Diagnosis not present

## 2013-01-14 DIAGNOSIS — M4712 Other spondylosis with myelopathy, cervical region: Secondary | ICD-10-CM | POA: Diagnosis not present

## 2013-01-14 DIAGNOSIS — M542 Cervicalgia: Secondary | ICD-10-CM | POA: Diagnosis not present

## 2013-01-14 DIAGNOSIS — M4312 Spondylolisthesis, cervical region: Secondary | ICD-10-CM

## 2013-01-14 DIAGNOSIS — R51 Headache: Secondary | ICD-10-CM | POA: Diagnosis not present

## 2013-01-14 DIAGNOSIS — M961 Postlaminectomy syndrome, not elsewhere classified: Secondary | ICD-10-CM | POA: Diagnosis not present

## 2013-01-14 DIAGNOSIS — M4310 Spondylolisthesis, site unspecified: Secondary | ICD-10-CM

## 2013-01-14 MED ORDER — OXYCODONE HCL ER 30 MG PO T12A: 60.0000 mg | EXTENDED_RELEASE_TABLET | Freq: Three times a day (TID) | ORAL | Status: DC

## 2013-01-14 MED ORDER — OXYCODONE HCL 5 MG PO CAPS: 5.0000 mg | ORAL_CAPSULE | Freq: Four times a day (QID) | ORAL | Status: DC | PRN

## 2013-01-14 NOTE — Progress Notes (Signed)
Subjective:    Patient ID: [name], male    DOB: Jul 26, 1941, 72 y.o.   MRN: [id number]  HPI The patient complains about chronic pain on the top of his head and behind his right ear. The patient also complains about some neck pain.  The problem has been stable. The patient states that his pain medication is controlling his pain.He reports that he has followed up with his neurosurgeon Dr. Channing Mutters on the 21st of August, the patient is very frustrated, because Dr. Channing Mutters just told him that he should see him again in 6 month, and that he is not considering any further surgery at this point. I referred him to another neurosurgeon for a second opinion, he saw Dr. Danielle Dess on Nov. 6th., who told the patient that he also thinks, that he is not a surgical candidate at this time. The patient states, that he fell and hit his head, he was seen by ED, he had a laceration on his head, he is now back to his baseline concerning his symptoms.  Pain Inventory Average Pain 7 Pain Right Now 6 My pain is stabbing  In the last 24 hours, has pain interfered with the following? General activity 7 Relation with others 6 Enjoyment of life 6 What TIME of day is your pain at its worst? daytime Sleep (in general) Fair  Pain is worse with: bending Pain improves with: rest Relief from Meds: 7  Mobility use a cane ability to climb steps?  yes do you drive?  yes Do you have any goals in this area?  yes  Function retired  Neuro/Psych loss of taste or smell  Prior Studies Any changes since last visit?  no  Physicians involved in your care Any changes since last visit?  no   Family History  Problem Relation Age of Onset  . Cancer Mother 30    cervical cancer  . Cancer Father 53    lung  cancer   History   Social History  . Marital Status: Single    Spouse Name: N/A    Number of Children: N/A  . Years of Education: N/A   Social History Main Topics  . Smoking status: Former Games developer  . Smokeless  tobacco: Former Neurosurgeon    Quit date: 02/12/1985  . Alcohol Use: No  . Drug Use: No  . Sexually Active: None   Other Topics Concern  . None   Social History Narrative  . None   Past Surgical History  Procedure Date  . Spine surgery     neck fusion  . Joint replacement     both knees   Past Medical History  Diagnosis Date  . Cervical spondylosis without myelopathy   . Lumbosacral spondylosis without myelopathy   . Primary localized osteoarthrosis, lower leg   . Primary localized osteoarthrosis, shoulder region   . Cervicalgia   . Degeneration of thoracic or thoracolumbar intervertebral disc   . Depression   . Chronic pain syndrome    BP 160/77  Pulse 54  Resp 14  Ht 5\' 8"  (1.727 m)  Wt 232 lb (105.235 kg)  BMI 35.28 kg/m2  SpO2 96%     Review of Systems  Constitutional: Positive for appetite change.  HENT: Positive for neck pain.   Respiratory: Positive for shortness of breath.   Gastrointestinal: Positive for abdominal pain.  Musculoskeletal: Positive for back pain.  All other systems reviewed and are negative.       Objective:   Physical  Exam Constitutional: He is oriented to person, place, and time. He appears well-developed and well-nourished.  obese  HENT:  Head: Normocephalic.  Musculoskeletal: He exhibits tenderness.  Neurological: He is alert and oriented to person, place, and time.  Skin: Skin is warm and dry.  Psychiatric: He has a normal mood and affect.  Symmetric normal motor tone is noted throughout. Normal muscle bulk. Muscle testing reveals 5/5 muscle strength of the upper extremity, except his right UE 4+/5, and 5/5 of the lower extremity. ROM of cervical spine is severely Restricted, basically rotation to both sides is non existent. Fine motor movements are normal in both hands.  DTR in the upper and lower extremity are present and symmetric 2+. No clonus is noted.  Patient arises from chair with difficulty. Wide based gait with a cane,  difficulty with balance.         Assessment & Plan:  1. Chronic cervicalgia status post multiple cervical spine surgeries.  2. Spondylolisthesis C6-7, MRI from Feb 2013 showed : Chronic myelomalacia in the spinal cord at C6-7. 6 mm anterior slip C6-7 with mild spinal stenosis.  Spondylosis and mild spinal stenosis at C7-T1 and T1-2.  Increased fluid signal in the disc space at C5-6 suggesting  possible incomplete posterior fusion at this level, as suggested on  a prior CT.  PLAN:  He will resume home exercises. He will continue his walking program.  3. Refill OxyContin 30 mg, 2 tablets q.8 hours #180 and oxycodone IR 10 mg, #90 , when patient gives a sample for a UDS .  4. Pt saw Dr. Channing Mutters to discuss his MRI on 08/06/2012 , the patient states, that Dr. Channing Mutters is not considering other treatment than pain management at this point. Patient is considering getting a second opinion, I referred him to another neurosurgeon,he saw Dr. Danielle Dess for a second opinion on Nov. 6th., who also determined, that he is not a surgical candidate at this time   Follow up in one month

## 2013-01-14 NOTE — Patient Instructions (Signed)
Continue with staying as active as pain permits 

## 2013-01-21 ENCOUNTER — Telehealth: Payer: Self-pay

## 2013-01-21 NOTE — Telephone Encounter (Signed)
Patient called regarding his oxycodone.  It has been denied by insurance and he needs prior authorization.  He was advised to pay out of pocket since he will be out before the prior Berkley Harvey is approved.  He agree.  Prior auth initiated.

## 2013-02-03 ENCOUNTER — Telehealth: Payer: Self-pay

## 2013-02-03 DIAGNOSIS — Z96659 Presence of unspecified artificial knee joint: Secondary | ICD-10-CM | POA: Diagnosis not present

## 2013-02-03 NOTE — Telephone Encounter (Signed)
Patient called complaining about his head hurting.  Advised patient to contact PCP or our office.

## 2013-02-09 ENCOUNTER — Telehealth: Payer: Self-pay

## 2013-02-09 NOTE — Telephone Encounter (Signed)
Patient called and requested we call [facility].  Per pharmacy patient needs a new script for this month.  His insurance will not pay for more than 120.  Is it ok to refill this or do you want to change the directions.

## 2013-02-09 NOTE — Telephone Encounter (Signed)
Samantha a pharmacist at [facility] called with questions regarding patients oxycontin RX.  Please call.

## 2013-02-10 ENCOUNTER — Encounter: Payer: Self-pay | Admitting: Physical Medicine and Rehabilitation

## 2013-02-10 ENCOUNTER — Encounter
Payer: Medicare Other | Attending: Physical Medicine and Rehabilitation | Admitting: Physical Medicine and Rehabilitation

## 2013-02-10 VITALS — BP 164/104 | HR 63 | Resp 14 | Ht 68.0 in | Wt 230.0 lb

## 2013-02-10 DIAGNOSIS — G8929 Other chronic pain: Secondary | ICD-10-CM | POA: Diagnosis not present

## 2013-02-10 DIAGNOSIS — M545 Low back pain, unspecified: Secondary | ICD-10-CM | POA: Diagnosis not present

## 2013-02-10 DIAGNOSIS — M961 Postlaminectomy syndrome, not elsewhere classified: Secondary | ICD-10-CM | POA: Diagnosis not present

## 2013-02-10 DIAGNOSIS — Z961 Presence of intraocular lens: Secondary | ICD-10-CM | POA: Diagnosis not present

## 2013-02-10 DIAGNOSIS — H1045 Other chronic allergic conjunctivitis: Secondary | ICD-10-CM | POA: Diagnosis not present

## 2013-02-10 DIAGNOSIS — Q762 Congenital spondylolisthesis: Secondary | ICD-10-CM | POA: Insufficient documentation

## 2013-02-10 DIAGNOSIS — M542 Cervicalgia: Secondary | ICD-10-CM | POA: Diagnosis not present

## 2013-02-10 DIAGNOSIS — G894 Chronic pain syndrome: Secondary | ICD-10-CM

## 2013-02-10 MED ORDER — OXYCODONE HCL ER 30 MG PO T12A: 60.0000 mg | EXTENDED_RELEASE_TABLET | Freq: Three times a day (TID) | ORAL | Status: DC

## 2013-02-10 MED ORDER — OXYCODONE HCL 5 MG PO CAPS: 5.0000 mg | ORAL_CAPSULE | Freq: Four times a day (QID) | ORAL | Status: DC | PRN

## 2013-02-10 NOTE — Patient Instructions (Signed)
Try to stay as active as tolerated 

## 2013-02-10 NOTE — Progress Notes (Signed)
Subjective:    Patient ID: [name], male    DOB: 21-Apr-1941, 72 y.o.   MRN: [id number]  HPI The patient complains about chronic pain on the top of his head and behind his right ear. The patient also complains about some neck pain.  The problem has been stable. The patient states that his pain medication is controlling his pain.He reports that he has followed up with his neurosurgeon Dr. Channing Mutters on the 21st of August, the patient is very frustrated, because Dr. Channing Mutters just told him that he should see him again in 6 month, and that he is not considering any further surgery at this point. I referred him to another neurosurgeon for a second opinion, he saw Dr. Danielle Dess on Nov. 6th., who told the patient that he also thinks, that he is not a surgical candidate at this time. The patient states, that he fell and hit his head, he was seen by ED, he had a laceration on his head, he is now back to his baseline concerning his symptoms.  Pain Inventory Average Pain 7 Pain Right Now 8 My pain is burning and aching  In the last 24 hours, has pain interfered with the following? General activity 8 Relation with others 9 Enjoyment of life 3 What TIME of day is your pain at its worst? all the time Sleep (in general) Fair  Pain is worse with: some activites Pain improves with: rest and medication Relief from Meds: 6  Mobility walk without assistance use a cane ability to climb steps?  yes do you drive?  yes Do you have any goals in this area?  yes  Function disabled: date disabled see chart Do you have any goals in this area?  yes  Neuro/Psych weakness anxiety loss of taste or smell  Prior Studies Any changes since last visit?  no  Physicians involved in your care Any changes since last visit?  no   Family History  Problem Relation Age of Onset  . Cancer Mother 30    cervical cancer  . Cancer Father 69    lung  cancer   History   Social History  . Marital Status: Single    Spouse  Name: N/A    Number of Children: N/A  . Years of Education: N/A   Social History Main Topics  . Smoking status: Former Games developer  . Smokeless tobacco: Former Neurosurgeon    Quit date: 02/12/1985  . Alcohol Use: No  . Drug Use: No  . Sexually Active: None   Other Topics Concern  . None   Social History Narrative  . None   Past Surgical History  Procedure Laterality Date  . Spine surgery      neck fusion  . Joint replacement      both knees   Past Medical History  Diagnosis Date  . Cervical spondylosis without myelopathy   . Lumbosacral spondylosis without myelopathy   . Primary localized osteoarthrosis, lower leg   . Primary localized osteoarthrosis, shoulder region   . Cervicalgia   . Degeneration of thoracic or thoracolumbar intervertebral disc   . Depression   . Chronic pain syndrome    BP 164/104  Pulse 63  Resp 14  Ht 5\' 8"  (1.727 m)  Wt 230 lb (104.327 kg)  BMI 34.98 kg/m2  SpO2 95%     Review of Systems  Constitutional: Positive for appetite change.  HENT: Positive for neck pain.   Cardiovascular: Positive for leg swelling.  Neurological: Positive  for weakness.  Psychiatric/Behavioral: The patient is nervous/anxious.   All other systems reviewed and are negative.       Objective:   Physical Exam Constitutional: He is oriented to person, place, and time. He appears well-developed and well-nourished.  obese  HENT:  Head: Normocephalic.  Musculoskeletal: He exhibits tenderness.  Neurological: He is alert and oriented to person, place, and time.  Skin: Skin is warm and dry.  Psychiatric: He has a normal mood and affect.  Symmetric normal motor tone is noted throughout. Normal muscle bulk. Muscle testing reveals 5/5 muscle strength of the upper extremity, except his right UE 4+/5, and 5/5 of the lower extremity. ROM of cervical spine is severely Restricted, basically rotation to both sides is non existent. Fine motor movements are normal in both hands.  DTR  in the upper and lower extremity are present and symmetric 2+. No clonus is noted.  Patient arises from chair with difficulty. Wide based gait with a cane, difficulty with balance.         Assessment & Plan:  1. Chronic cervicalgia status post multiple cervical spine surgeries.  2. Spondylolisthesis C6-7, MRI from Feb 2013 showed : Chronic myelomalacia in the spinal cord at C6-7. 6 mm anterior slip C6-7 with mild spinal stenosis.  Spondylosis and mild spinal stenosis at C7-T1 and T1-2.  Increased fluid signal in the disc space at C5-6 suggesting  possible incomplete posterior fusion at this level, as suggested on  a prior CT. 3. LBP , radiating into his right LE in his posterior thigh down to his knee, wants referral to Dr. Danielle Dess for an evaluation of these symptoms. I wrote a referral. PLAN:  He will resume home exercises. He will continue his walking program.   3. Refill OxyContin 30 mg, 2 tablets q.8 hours #180 and oxycodone IR 10 mg, #90. Patient had some difficulty at his pharmacy to get those filled last month, they only filled the Oxycontin for 20 days, they told him they can only give him 120, but the patient got a letter from his insurance, after I filled out the necessary paper work, that they approve the 180 per month. Hopefully this is straighten out now.  4. Pt saw Dr. Channing Mutters to discuss his MRI on 08/06/2012 , the patient states, that Dr. Channing Mutters is not considering other treatment than pain management at this point. Patient is considering getting a second opinion, I referred him to another neurosurgeon,he saw Dr. Danielle Dess for a second opinion on Nov. 6th., who also determined, that he is not a surgical candidate at this time  Follow up in one month

## 2013-02-10 NOTE — Telephone Encounter (Signed)
Per insurance patient cannot get this medication filled until 02/13/13.  Pharmacy is aware and is going to get an override for this since he was only given a 20 day supply.  Per insurance this medication is approved until the end of the year.  Patient and pharmacy aware.

## 2013-02-10 NOTE — Telephone Encounter (Signed)
We filled something out and sent it to his insurance at one point, that he needs and has been on the dosage he is on for a long time, as far as I know the insurance has approved that. Does he have an appointment ? If so, we can refill.

## 2013-02-12 ENCOUNTER — Ambulatory Visit: Payer: Medicare Other | Admitting: Physical Medicine and Rehabilitation

## 2013-02-17 ENCOUNTER — Telehealth: Payer: Self-pay | Admitting: Physical Medicine and Rehabilitation

## 2013-02-19 ENCOUNTER — Telehealth: Payer: Self-pay | Admitting: *Deleted

## 2013-02-19 NOTE — Telephone Encounter (Signed)
Rambling message.  Uncertain what he is asking for or if he is asking for anything. He was talking about how many times he called [employer].  I called and he just wanted to talk. He did not want anything. Just having problems with other doctors and his back. We moved his appointment with Clydie Braun to an earlier date.

## 2013-02-23 ENCOUNTER — Encounter
Payer: Medicare Other | Attending: Physical Medicine and Rehabilitation | Admitting: Physical Medicine and Rehabilitation

## 2013-02-23 ENCOUNTER — Encounter: Payer: Self-pay | Admitting: Physical Medicine and Rehabilitation

## 2013-02-23 VITALS — BP 147/99 | HR 90 | Resp 16 | Ht 68.0 in | Wt 227.2 lb

## 2013-02-23 DIAGNOSIS — M79609 Pain in unspecified limb: Secondary | ICD-10-CM | POA: Insufficient documentation

## 2013-02-23 DIAGNOSIS — M542 Cervicalgia: Secondary | ICD-10-CM | POA: Diagnosis not present

## 2013-02-23 DIAGNOSIS — M4712 Other spondylosis with myelopathy, cervical region: Secondary | ICD-10-CM | POA: Insufficient documentation

## 2013-02-23 DIAGNOSIS — M25569 Pain in unspecified knee: Secondary | ICD-10-CM | POA: Diagnosis not present

## 2013-02-23 DIAGNOSIS — Q762 Congenital spondylolisthesis: Secondary | ICD-10-CM | POA: Insufficient documentation

## 2013-02-23 DIAGNOSIS — G8929 Other chronic pain: Secondary | ICD-10-CM | POA: Diagnosis not present

## 2013-02-23 DIAGNOSIS — M545 Low back pain, unspecified: Secondary | ICD-10-CM | POA: Insufficient documentation

## 2013-02-23 MED ORDER — OXYCODONE HCL ER 30 MG PO T12A: 60.0000 mg | EXTENDED_RELEASE_TABLET | Freq: Three times a day (TID) | ORAL | Status: DC

## 2013-02-23 MED ORDER — OXYCODONE HCL 5 MG PO CAPS: 5.0000 mg | ORAL_CAPSULE | Freq: Four times a day (QID) | ORAL | Status: DC | PRN

## 2013-02-23 NOTE — Progress Notes (Signed)
Subjective:    Patient ID: [name], male    DOB: 1941-11-02, 72 y.o.   MRN: [id number]  HPI The patient complains about chronic pain on the top of his head and behind his right ear. The patient also complains about some neck pain.  The problem has been stable. The patient states that his pain medication is controlling his pain.He reports that he has followed up with his neurosurgeon Dr. Channing Mutters on the 21st of August, the patient is very frustrated, because Dr. Channing Mutters just told him that he should see him again in 6 month, and that he is not considering any further surgery at this point. I referred him to another neurosurgeon for a second opinion, he saw Dr. Danielle Dess on Nov. 6th., who told the patient that he also thinks, that he is not a surgical candidate at this time. The patient states, that he fell and hit his head, he was seen by ED, he had a laceration on his head, he is now back to his baseline concerning his symptoms.  Pain Inventory  Average Pain 7  Pain Right Now 8  My pain is burning and aching  In the last 24 hours, has pain interfered with the following?  General activity 8  Relation with others 9  Enjoyment of life 3  What TIME of day is your pain at its worst? all the time  Sleep (in general) Fair  Pain is worse with: some activites  Pain improves with: rest and medication  Relief from Meds: 6  Mobility  walk without assistance  use a cane  ability to climb steps? yes  do you drive? yes  Do you have any goals in this area? yes  Function  disabled: date disabled see chart  Do you have any goals in this area? yes  Neuro/Psych  weakness  anxiety  loss of taste or smell  Prior Studies  Any changes since last visit? no  Physicians involved in your care  Any changes since last visit? no  Family History  Problem Relation Age of Onset  . Cancer Mother 30    cervical cancer  . Cancer Father 27    lung  cancer   History   Social History  . Marital Status: Single   Spouse Name: N/A    Number of Children: N/A  . Years of Education: N/A   Social History Main Topics  . Smoking status: Former Games developer  . Smokeless tobacco: Former Neurosurgeon    Quit date: 02/12/1985  . Alcohol Use: No  . Drug Use: No  . Sexually Active: None   Other Topics Concern  . None   Social History Narrative  . None   Past Surgical History  Procedure Laterality Date  . Spine surgery      neck fusion  . Joint replacement      both knees   Past Medical History  Diagnosis Date  . Cervical spondylosis without myelopathy   . Lumbosacral spondylosis without myelopathy   . Primary localized osteoarthrosis, lower leg   . Primary localized osteoarthrosis, shoulder region   . Cervicalgia   . Degeneration of thoracic or thoracolumbar intervertebral disc   . Depression   . Chronic pain syndrome    BP 147/99  Pulse 90  Resp 16  Ht 5\' 8"  (1.727 m)  Wt 227 lb 3.2 oz (103.057 kg)  BMI 34.55 kg/m2     Review of Systems  HENT: Positive for neck pain.   Musculoskeletal: Positive  for back pain.  All other systems reviewed and are negative.       Objective:   Physical Exam Constitutional: He is oriented to person, place, and time. He appears well-developed and well-nourished.  obese  HENT:  Head: Normocephalic.  Musculoskeletal: He exhibits tenderness.  Neurological: He is alert and oriented to person, place, and time.  Skin: Skin is warm and dry.  Psychiatric: He has a normal mood and affect.  Symmetric normal motor tone is noted throughout. Normal muscle bulk. Muscle testing reveals 5/5 muscle strength of the upper extremity, except his right UE 4+/5, and 5/5 of the lower extremity. ROM of cervical spine is severely Restricted, basically rotation to both sides is non existent. Fine motor movements are normal in both hands.  DTR in the upper and lower extremity are present and symmetric 2+. No clonus is noted.  Patient arises from chair with difficulty. Wide based gait  with a cane, difficulty with balance.         Assessment & Plan:  1. Chronic cervicalgia status post multiple cervical spine surgeries.  2. Spondylolisthesis C6-7, MRI from Feb 2013 showed : Chronic myelomalacia in the spinal cord at C6-7. 6 mm anterior slip C6-7 with mild spinal stenosis.  Spondylosis and mild spinal stenosis at C7-T1 and T1-2.  Increased fluid signal in the disc space at C5-6 suggesting  possible incomplete posterior fusion at this level, as suggested on  a prior CT.  3. LBP , radiating into his right LE in his posterior thigh down to his knee, wants referral to Dr. Danielle Dess for an evaluation of these symptoms. I wrote a referral.  PLAN:  He will resume home exercises. He will continue his walking program.  3. Refill OxyContin 30 mg, 2 tablets q.8 hours #180 and oxycodone IR 5 mg, #90. Patient had some difficulty at his pharmacy to get those filled last month, they only filled the Oxycontin for 20 days, they told him they can only give him 120, but the patient got a letter from his insurance, after I filled out the necessary paper work, that they approve the 180 per month. Hopefully this is straighten out now.  4. Pt saw Dr. Channing Mutters to discuss his MRI on 08/06/2012 , the patient states, that Dr. Channing Mutters is not considering other treatment than pain management at this point. Patient is considering getting a second opinion, I referred him to another neurosurgeon,he saw Dr. Danielle Dess for a second opinion on Nov. 6th., who also determined, that he is not a surgical candidate at this time  Follow up in one month

## 2013-02-23 NOTE — Patient Instructions (Signed)
Try to stay as active as tolerated 

## 2013-03-09 ENCOUNTER — Ambulatory Visit: Payer: Medicare Other | Admitting: Physical Medicine and Rehabilitation

## 2013-03-09 DIAGNOSIS — H9209 Otalgia, unspecified ear: Secondary | ICD-10-CM | POA: Diagnosis not present

## 2013-03-12 ENCOUNTER — Other Ambulatory Visit: Payer: Medicare Other

## 2013-03-12 ENCOUNTER — Other Ambulatory Visit: Payer: Self-pay | Admitting: *Deleted

## 2013-03-12 DIAGNOSIS — R739 Hyperglycemia, unspecified: Secondary | ICD-10-CM

## 2013-03-12 DIAGNOSIS — R5381 Other malaise: Secondary | ICD-10-CM

## 2013-03-12 DIAGNOSIS — D509 Iron deficiency anemia, unspecified: Secondary | ICD-10-CM

## 2013-03-12 DIAGNOSIS — I10 Essential (primary) hypertension: Secondary | ICD-10-CM

## 2013-03-12 DIAGNOSIS — Z0000 Encounter for general adult medical examination without abnormal findings: Secondary | ICD-10-CM | POA: Diagnosis not present

## 2013-03-12 DIAGNOSIS — R7309 Other abnormal glucose: Secondary | ICD-10-CM | POA: Diagnosis not present

## 2013-03-13 LAB — CBC WITH DIFFERENTIAL/PLATELET
Basophils Absolute: 0 10*3/uL (ref 0.0–0.2)
Eosinophils Absolute: 0.4 10*3/uL (ref 0.0–0.4)
Hemoglobin: 12.7 g/dL (ref 12.6–17.7)
Lymphs: 21 % (ref 14–46)
MCH: 30.2 pg (ref 26.6–33.0)
MCHC: 33.3 g/dL (ref 31.5–35.7)
MCV: 91 fL (ref 79–97)
Monocytes Absolute: 0.6 10*3/uL (ref 0.1–0.9)
Neutrophils Absolute: 4.5 10*3/uL (ref 1.4–7.0)

## 2013-03-13 LAB — COMPREHENSIVE METABOLIC PANEL
ALT: 17 IU/L (ref 0–44)
AST: 23 IU/L (ref 0–40)
Albumin/Globulin Ratio: 1.8 (ref 1.1–2.5)
Alkaline Phosphatase: 100 IU/L (ref 39–117)
BUN/Creatinine Ratio: 16 (ref 10–22)
Chloride: 101 mmol/L (ref 97–108)
GFR calc Af Amer: 106 mL/min/{1.73_m2} (ref >59)
GFR calc non Af Amer: 92 mL/min/{1.73_m2} (ref >59)
Glucose: 88 mg/dL (ref 65–99)
Potassium: 4.8 mmol/L (ref 3.5–5.2)
Sodium: 141 mmol/L (ref 134–144)
Total Bilirubin: 0.4 mg/dL (ref 0.0–1.2)
Total Protein: 6.6 g/dL (ref 6.0–8.5)

## 2013-03-13 LAB — HEMOGLOBIN A1C: Hgb A1c MFr Bld: 5.3 % (ref 4.8–5.6)

## 2013-03-14 ENCOUNTER — Other Ambulatory Visit (HOSPITAL_BASED_OUTPATIENT_CLINIC_OR_DEPARTMENT_OTHER): Payer: Self-pay | Admitting: Internal Medicine

## 2013-03-20 ENCOUNTER — Emergency Department (HOSPITAL_COMMUNITY)
Admission: EM | Admit: 2013-03-20 | Discharge: 2013-03-20 | Disposition: A | Discharge status: Home / Self Care | Payer: Medicare Other | Attending: Emergency Medicine | Admitting: Emergency Medicine

## 2013-03-20 ENCOUNTER — Encounter (HOSPITAL_COMMUNITY): Payer: Self-pay | Admitting: Emergency Medicine

## 2013-03-20 ENCOUNTER — Emergency Department (HOSPITAL_COMMUNITY): Payer: Medicare Other

## 2013-03-20 DIAGNOSIS — W19XXXA Unspecified fall, initial encounter: Secondary | ICD-10-CM | POA: Insufficient documentation

## 2013-03-20 DIAGNOSIS — M542 Cervicalgia: Secondary | ICD-10-CM | POA: Insufficient documentation

## 2013-03-20 DIAGNOSIS — Z87891 Personal history of nicotine dependence: Secondary | ICD-10-CM | POA: Insufficient documentation

## 2013-03-20 DIAGNOSIS — G894 Chronic pain syndrome: Secondary | ICD-10-CM | POA: Diagnosis not present

## 2013-03-20 DIAGNOSIS — Z8739 Personal history of other diseases of the musculoskeletal system and connective tissue: Secondary | ICD-10-CM | POA: Diagnosis not present

## 2013-03-20 DIAGNOSIS — R51 Headache: Secondary | ICD-10-CM | POA: Diagnosis not present

## 2013-03-20 DIAGNOSIS — S060X0A Concussion without loss of consciousness, initial encounter: Secondary | ICD-10-CM

## 2013-03-20 DIAGNOSIS — F3289 Other specified depressive episodes: Secondary | ICD-10-CM | POA: Insufficient documentation

## 2013-03-20 DIAGNOSIS — W1809XA Striking against other object with subsequent fall, initial encounter: Secondary | ICD-10-CM | POA: Insufficient documentation

## 2013-03-20 DIAGNOSIS — G8929 Other chronic pain: Secondary | ICD-10-CM | POA: Diagnosis not present

## 2013-03-20 DIAGNOSIS — Z7982 Long term (current) use of aspirin: Secondary | ICD-10-CM | POA: Diagnosis not present

## 2013-03-20 DIAGNOSIS — S060X9A Concussion with loss of consciousness of unspecified duration, initial encounter: Secondary | ICD-10-CM | POA: Diagnosis not present

## 2013-03-20 DIAGNOSIS — Y939 Activity, unspecified: Secondary | ICD-10-CM | POA: Insufficient documentation

## 2013-03-20 DIAGNOSIS — F329 Major depressive disorder, single episode, unspecified: Secondary | ICD-10-CM | POA: Insufficient documentation

## 2013-03-20 DIAGNOSIS — Z79899 Other long term (current) drug therapy: Secondary | ICD-10-CM | POA: Insufficient documentation

## 2013-03-20 DIAGNOSIS — Y929 Unspecified place or not applicable: Secondary | ICD-10-CM | POA: Insufficient documentation

## 2013-03-20 NOTE — ED Notes (Signed)
Patient c/o headache and neck pain.  Patient states he has a plate, pins, and rods in his neck.

## 2013-03-20 NOTE — ED Notes (Signed)
Patient states that he has plates in his head.Neck fixed in a neutral position and was able to turn head 3 degress to the left or right. Patient fell and about 12 months ago. Larey Seat again apprx 3 months ago and hit forehead. now positioning of head in downward position.Denies HA.

## 2013-03-20 NOTE — ED Provider Notes (Signed)
History     CSN: [id number]  Arrival date & time 03/20/13  1529   First MD Initiated Contact with Patient 03/20/13 1615      Chief Complaint  Patient presents with  . Headache  . Neck Pain    (Consider location/radiation/quality/duration/timing/severity/associated sxs/prior treatment) The history is provided by the patient.  [name] is a 72 y.o. male history of chronic neck pain status post rods here presenting with headaches and neck pain. He fell over the last couple of months last fall was 2 months ago. However he has occasional headaches and is worse when he moves his neck. The neck pain is chronic but got worse and went to a urgent care and was sent here for evaluation.  Was pain when he walks but denies any weakness or numbness or urinary incontinence. No fevers or chills or back pain.    Past Medical History  Diagnosis Date  . Cervical spondylosis without myelopathy   . Lumbosacral spondylosis without myelopathy   . Primary localized osteoarthrosis, lower leg   . Primary localized osteoarthrosis, shoulder region   . Cervicalgia   . Degeneration of thoracic or thoracolumbar intervertebral disc   . Depression   . Chronic pain syndrome     Past Surgical History  Procedure Laterality Date  . Spine surgery      neck fusion  . Joint replacement      both knees    Family History  Problem Relation Age of Onset  . Cancer Mother 30    cervical cancer  . Cancer Father 69    lung  cancer    History  Substance Use Topics  . Smoking status: Former Games developer  . Smokeless tobacco: Former Neurosurgeon    Quit date: 02/12/1985  . Alcohol Use: No      Review of Systems  HENT: Positive for neck pain.   Neurological: Positive for headaches.  All other systems reviewed and are negative.    Allergies  Zanaflex  Home Medications   Current Outpatient Rx  Name  Route  Sig  Dispense  Refill  . aspirin 81 MG tablet   Oral   Take 81 mg by mouth daily.         Marland Kitchen  buPROPion (WELLBUTRIN SR) 150 MG 12 hr tablet   Oral   Take 150 mg by mouth 2 (two) times daily.           . diclofenac sodium (VOLTAREN) 1 % GEL   Topical   Apply 1 application topically 3 (three) times daily. Applies to knees         . DULoxetine (CYMBALTA) 60 MG capsule   Oral   Take 60 mg by mouth daily.         . meloxicam (MOBIC) 7.5 MG tablet               . mupirocin ointment (BACTROBAN) 2 %               . nebivolol (BYSTOLIC) 5 MG tablet   Oral   Take 5 mg by mouth daily.         Marland Kitchen omeprazole (PRILOSEC) 20 MG capsule   Oral   Take 1 capsule by mouth Daily.         Marland Kitchen oxycodone (OXY-IR) 5 MG capsule   Oral   Take 5 mg by mouth every 6 (six) hours as needed for pain.         . OxyCODONE HCl  ER (OXYCONTIN) 30 MG T12A   Oral   Take 60 mg by mouth 3 (three) times daily.   180 each   0   . simvastatin (ZOCOR) 10 MG tablet   Oral   Take 10 mg by mouth at bedtime.         . sucralfate (CARAFATE) 1 G tablet   Oral   Take 1 g by mouth 2 (two) times daily.           BP 142/69  Pulse 60  Temp(Src) 98.1 F (36.7 C) (Oral)  Resp 20  SpO2 96%  Physical Exam  Nursing note and vitals reviewed. Constitutional: He is oriented to person, place, and time.  Slightly uncomfortable   HENT:  Head: Normocephalic and atraumatic.  Mouth/Throat: Oropharynx is clear and moist.  No obvious signs of trauma   Eyes: Conjunctivae are normal. Pupils are equal, round, and reactive to light.  Neck:  Midline scar healed. Dec ROM of neck from pain. No bony tenderness   Cardiovascular: Normal rate, regular rhythm and normal heart sounds.   Pulmonary/Chest: Effort normal and breath sounds normal. No respiratory distress. He has no wheezes. He has no rales.  Abdominal: Soft. Bowel sounds are normal. He exhibits no distension. There is no tenderness. There is no rebound and no guarding.  Musculoskeletal: Normal range of motion. He exhibits no edema and no  tenderness.  No midline lumbar tenderness   Neurological: He is alert and oriented to person, place, and time.  Nl sensation and 5/5 strength throughout. Nl gait, neg rhomberg   Skin: Skin is warm and dry.  Psychiatric: He has a normal mood and affect. His behavior is normal. Judgment and thought content normal.    ED Course  Procedures (including critical care time)  Labs Reviewed - No data to display Ct Head Wo Contrast  03/20/2013  *RADIOLOGY REPORT*  Clinical Data:  Head and neck pain.  No known injury.  CT HEAD WITHOUT CONTRAST CT CERVICAL SPINE WITHOUT CONTRAST  Technique:  Multidetector CT imaging of the head and cervical spine was performed following the standard protocol without intravenous contrast.  Multiplanar CT image reconstructions of the cervical spine were also generated.  Comparison:  12/25/2012  CT HEAD  Findings: There is diffuse patchy low density throughout the subcortical and periventricular white matter consistent with chronic small vessel ischemic change.  There is prominence of the sulci and ventricles consistent with brain atrophy.  There is no evidence for acute brain infarct, hemorrhage or mass.  The calvarium appears intact.  IMPRESSION:  1.  No acute intracranial abnormalities. 2.  Small vessel ischemic disease and brain atrophy.  CT CERVICAL SPINE  Findings: Prior hardware fusion of the occiput through C2-C7. Additional posterior fusion of C4-T1 noted.  Anterior fusion of C6 and C7 noted.  Chronic anterolisthesis of C6 on C7 noted which measures 7-8 mm.  This is unchanged from previous examination. Stable deformity involving the odontoid and anterior C1. Prevertebral soft tissue space appears within normal limits. No acute fracture or subluxation identified.  IMPRESSION:  1.  Extensive postsurgical change within the cervical spine. 2.  No acute findings identified.   Original Report Authenticated By: Signa Kell, M.D.    Ct Cervical Spine Wo Contrast  03/20/2013   *RADIOLOGY REPORT*  Clinical Data:  Head and neck pain.  No known injury.  CT HEAD WITHOUT CONTRAST CT CERVICAL SPINE WITHOUT CONTRAST  Technique:  Multidetector CT imaging of the head and cervical spine was  performed following the standard protocol without intravenous contrast.  Multiplanar CT image reconstructions of the cervical spine were also generated.  Comparison:  12/25/2012  CT HEAD  Findings: There is diffuse patchy low density throughout the subcortical and periventricular white matter consistent with chronic small vessel ischemic change.  There is prominence of the sulci and ventricles consistent with brain atrophy.  There is no evidence for acute brain infarct, hemorrhage or mass.  The calvarium appears intact.  IMPRESSION:  1.  No acute intracranial abnormalities. 2.  Small vessel ischemic disease and brain atrophy.  CT CERVICAL SPINE  Findings: Prior hardware fusion of the occiput through C2-C7. Additional posterior fusion of C4-T1 noted.  Anterior fusion of C6 and C7 noted.  Chronic anterolisthesis of C6 on C7 noted which measures 7-8 mm.  This is unchanged from previous examination. Stable deformity involving the odontoid and anterior C1. Prevertebral soft tissue space appears within normal limits. No acute fracture or subluxation identified.  IMPRESSION:  1.  Extensive postsurgical change within the cervical spine. 2.  No acute findings identified.   Original Report Authenticated By: Signa Kell, M.D.      No diagnosis found.    MDM  [name] is a 72 y.o. male here with neck pain, headache. Likely concussion from previous falls exacerbation chronic pain. Will get CT head/neck to r/o subdural bleed and possible rod complications. No neuro deficit to warrant MRI. He refused pain meds here, just want imaging.   7:53 PM CT head/neck showed no acute changes. He wants to see another neurosurgeon for second opinion. Gave him the oncall neurosurgeon name. He has pain meds at home. Likely  has concussion with exacerbation of chronic pain. No neuro deficits.        Richardean Canal, MD 03/20/13 [phone]

## 2013-03-24 ENCOUNTER — Encounter: Payer: Self-pay | Admitting: Physical Medicine & Rehabilitation

## 2013-03-24 ENCOUNTER — Encounter: Payer: Medicare Other | Attending: Physical Medicine and Rehabilitation | Admitting: Physical Medicine & Rehabilitation

## 2013-03-24 VITALS — BP 143/100 | HR 54 | Resp 16 | Ht 67.0 in | Wt 222.0 lb

## 2013-03-24 DIAGNOSIS — Z96659 Presence of unspecified artificial knee joint: Secondary | ICD-10-CM | POA: Diagnosis not present

## 2013-03-24 DIAGNOSIS — F3289 Other specified depressive episodes: Secondary | ICD-10-CM | POA: Diagnosis not present

## 2013-03-24 DIAGNOSIS — M961 Postlaminectomy syndrome, not elsewhere classified: Secondary | ICD-10-CM | POA: Diagnosis not present

## 2013-03-24 DIAGNOSIS — R269 Unspecified abnormalities of gait and mobility: Secondary | ICD-10-CM | POA: Diagnosis not present

## 2013-03-24 DIAGNOSIS — F411 Generalized anxiety disorder: Secondary | ICD-10-CM

## 2013-03-24 DIAGNOSIS — M19019 Primary osteoarthritis, unspecified shoulder: Secondary | ICD-10-CM | POA: Diagnosis not present

## 2013-03-24 DIAGNOSIS — F329 Major depressive disorder, single episode, unspecified: Secondary | ICD-10-CM | POA: Insufficient documentation

## 2013-03-24 MED ORDER — OXYCODONE HCL 5 MG PO CAPS: 5.0000 mg | ORAL_CAPSULE | Freq: Four times a day (QID) | ORAL | Status: DC | PRN

## 2013-03-24 MED ORDER — BACLOFEN 10 MG PO TABS: 10.0000 mg | ORAL_TABLET | Freq: Every day | ORAL | Status: DC

## 2013-03-24 MED ORDER — OXYCODONE HCL ER 30 MG PO T12A: 60.0000 mg | EXTENDED_RELEASE_TABLET | Freq: Three times a day (TID) | ORAL | Status: DC

## 2013-03-24 NOTE — Progress Notes (Signed)
Subjective:    Patient ID: [name], male    DOB: 1941-08-03, 72 y.o.   MRN: [id number]  HPI  [name] is back regarding his chronic neck and shoulder girdle pain. He has been seeing my PA recently for follow up. He has returned to Dr. Channing Mutters for NS opinion and has also seen Dr. Danielle Dess both of whom had nothing further to offer.  He was in the ED Friday for increased dizziness and neck pain. He had a fall about 3-4 months ago where he suffered a contusion to the head which required wound care. He denies passing out. He states that his dizziness predated the fall and has not increased since the fall.  He does express a sensation of tightness or fullness in his neck which seems to compromise his balance. He has some difficulty in describing it quite frankly. The symptoms seem to be worse at night and in the morning when he first awakens.   I reviewed the CT of his head and neck from January and last week, and there are no obvious causes or sequelae from his falls. He does cerebral atrophy and multiple chronic changes related to his cervical stenosis and multiple surgeries.    Pain Inventory Average Pain 9 Pain Right Now 8 My pain is aching  In the last 24 hours, has pain interfered with the following? General activity 8 Relation with others 9 Enjoyment of life 8 What TIME of day is your pain at its worst? daytime Sleep (in general) Poor  Pain is worse with: some activites Pain improves with: rest, medication and injections Relief from Meds: n/a  Mobility use a cane how many minutes can you walk? 30 min do you drive?  yes  Function disabled: date disabled n/a I need assistance with the following:  household duties Do you have any goals in this area?  yes  Neuro/Psych weakness  Prior Studies Any changes since last visit?  no  Physicians involved in your care Any changes since last visit?  no   Family History  Problem Relation Age of Onset  . Cancer Mother 30    cervical  cancer  . Cancer Father 62    lung  cancer   History   Social History  . Marital Status: Single    Spouse Name: N/A    Number of Children: N/A  . Years of Education: N/A   Social History Main Topics  . Smoking status: Former Games developer  . Smokeless tobacco: Former Neurosurgeon    Quit date: 02/12/1985  . Alcohol Use: No  . Drug Use: No  . Sexually Active: None   Other Topics Concern  . None   Social History Narrative  . None   Past Surgical History  Procedure Laterality Date  . Spine surgery      neck fusion  . Joint replacement      both knees   Past Medical History  Diagnosis Date  . Cervical spondylosis without myelopathy   . Lumbosacral spondylosis without myelopathy   . Primary localized osteoarthrosis, lower leg   . Primary localized osteoarthrosis, shoulder region   . Cervicalgia   . Degeneration of thoracic or thoracolumbar intervertebral disc   . Depression   . Chronic pain syndrome    BP 143/100  Pulse 54  Resp 16  Ht 5\' 7"  (1.702 m)  Wt 222 lb (100.699 kg)  BMI 34.76 kg/m2  SpO2 96%      Review of Systems  Gastrointestinal: Positive for  abdominal pain.  Neurological: Positive for weakness.  All other systems reviewed and are negative.       Objective:   Physical Exam Constitutional: He is oriented to person, place, and time. He appears well-developed and well-nourished.  HENT:  Head: Normocephalic and atraumatic.  Voice raspy/hoarse. Occasionally clearing throat.  Eyes: Conjunctivae and EOM are normal. Pupils are equal, round, and reactive to light.  Neck: Normal range of motion.  Cardiovascular: Normal rate and regular rhythm.  Pulmonary/Chest: Effort normal and breath sounds normal.  Abdominal: Soft. Bowel sounds are normal.  Musculoskeletal:  Right shoulder pain with palpation particularly in the lateral and anteriorly. Chronic deformities noted from prior surgeries. Pain with resisted ER/IR/abduction right greater than left. Neck rom is  severely limited.  Neurological: He is alert and oriented to person, place, and time. No cranial nerve deficit. i saw no nystagmus to any field, i could not turn head however.  Reflex Scores:  Tricep reflexes are 1+ on the right side and 1+ on the left side.  Bicep reflexes are 1+ on the right side and 1+ on the left side.  Brachioradialis reflexes are 1+ on the right side and 1+ on the left side.  Patellar reflexes are 1+ on the right side and 1+ on the left side.  Achilles reflexes are 1+ on the right side and 1+ on the left side. Strength fairly well preserved in all 4 limbs. Scattered sensory loss in anterior thighs.  Romberg test was positive. Unable to scan well given limited cervical rom.  Psychiatric: His speech is normal. He is slowed. Cognition and memory are normal. He expresses impulsivity. He is irritable at times.   Assessment & Plan:   ASSESSMENT:  1. Chronic cervicalgia status post multiple cervical spine surgeries.  Likely scar tissue component with some neuropathic pain as well. There are  myofascial components also.  2.Recent falls with potential concussion in January. I see no signs of BPPV on exam. I think his gait disorder is multifactorial related to the above as well as his multiple other arthridities and medications.  3. Depression/anxiety. 4. Shoulder pain is more likely to be related to degenerative arthritis and multiple surgical interventions than to his c-spine base on exam and imaging.   PLAN:  1. Had a long discussion regarding the etiology of his symptoms. There is not a "smoking gun" here, as the cause of his problems are multifactorial and need to be addressed in that manner. 2 I will send him to neurorehab for balance assessment and recommendations. His gait problems are multifactorial.  3. Refilled OxyContin 60 mg q.8 hours #90 and oxycodone IR 10 mg, #90.  4. I will have him follow up with my PA in one month.   5. Pt has reviewed options with Dr. Channing Mutters and  Dr. Danielle Dess who had nothing to recommend from a surgical standpoint.  6. Will add baclofen low dose to be used at night for neck spasms. I do not want him taking it throughout the day. Will observe for tolerance and effect.

## 2013-03-24 NOTE — Patient Instructions (Signed)
CALL ME WITH ANY PROBLEMS OR QUESTIONS (#[phone]).  HAVE A GOOD DAY  

## 2013-03-30 ENCOUNTER — Emergency Department (HOSPITAL_COMMUNITY)
Admission: EM | Admit: 2013-03-30 | Discharge: 2013-03-30 | Disposition: A | Discharge status: Home / Self Care | Payer: Medicare Other | Attending: Emergency Medicine | Admitting: Emergency Medicine

## 2013-03-30 ENCOUNTER — Other Ambulatory Visit: Payer: Self-pay | Admitting: *Deleted

## 2013-03-30 ENCOUNTER — Encounter (HOSPITAL_COMMUNITY): Payer: Self-pay | Admitting: Emergency Medicine

## 2013-03-30 DIAGNOSIS — F329 Major depressive disorder, single episode, unspecified: Secondary | ICD-10-CM | POA: Insufficient documentation

## 2013-03-30 DIAGNOSIS — M47812 Spondylosis without myelopathy or radiculopathy, cervical region: Secondary | ICD-10-CM | POA: Insufficient documentation

## 2013-03-30 DIAGNOSIS — Z7982 Long term (current) use of aspirin: Secondary | ICD-10-CM | POA: Insufficient documentation

## 2013-03-30 DIAGNOSIS — M19019 Primary osteoarthritis, unspecified shoulder: Secondary | ICD-10-CM | POA: Insufficient documentation

## 2013-03-30 DIAGNOSIS — M47817 Spondylosis without myelopathy or radiculopathy, lumbosacral region: Secondary | ICD-10-CM | POA: Insufficient documentation

## 2013-03-30 DIAGNOSIS — Z981 Arthrodesis status: Secondary | ICD-10-CM | POA: Insufficient documentation

## 2013-03-30 DIAGNOSIS — Z87891 Personal history of nicotine dependence: Secondary | ICD-10-CM | POA: Insufficient documentation

## 2013-03-30 DIAGNOSIS — G8929 Other chronic pain: Secondary | ICD-10-CM

## 2013-03-30 DIAGNOSIS — M1710 Unilateral primary osteoarthritis, unspecified knee: Secondary | ICD-10-CM | POA: Insufficient documentation

## 2013-03-30 DIAGNOSIS — F3289 Other specified depressive episodes: Secondary | ICD-10-CM | POA: Insufficient documentation

## 2013-03-30 DIAGNOSIS — Z96659 Presence of unspecified artificial knee joint: Secondary | ICD-10-CM | POA: Diagnosis not present

## 2013-03-30 DIAGNOSIS — Z79899 Other long term (current) drug therapy: Secondary | ICD-10-CM | POA: Insufficient documentation

## 2013-03-30 DIAGNOSIS — M542 Cervicalgia: Secondary | ICD-10-CM | POA: Insufficient documentation

## 2013-03-30 DIAGNOSIS — IMO0002 Reserved for concepts with insufficient information to code with codable children: Secondary | ICD-10-CM | POA: Insufficient documentation

## 2013-03-30 MED ORDER — NEBIVOLOL HCL 10 MG PO TABS: ORAL_TABLET | ORAL | Status: DC

## 2013-03-30 NOTE — ED Notes (Signed)
Dr Clarene Duke at bedside.  Pt demanding an MRI.  Pt states " I am about to leave if I cant get an MRI"  Pt left out room on his cell phone stating "Is someone out there to pick me up, I want you to witness when I fall because they not giving me an MRI."  Pt up for dischrged but left before signing papers.  Pt left without discharge vitals

## 2013-03-30 NOTE — ED Provider Notes (Signed)
History     CSN: [id number]  Arrival date & time 03/30/13  1418   First MD Initiated Contact with Patient 03/30/13 1850      Chief Complaint  Patient presents with  . Neck Pain     HPI Pt was seen at 1900.  Per pt, c/o gradual onset and persistence of constant acute flair of his chronic neck "pain" for the past several months.  Denies any change in his usual chronic pain pattern.  Pain worsens with palpation of the area and body position changes. Denies incont/retention of bowel or bladder, no saddle anesthesia, no focal motor weakness, no tingling/numbness in extremities, no fevers, no injury, no CP/SOB.   The symptoms have been associated with no other complaints. The patient has a significant history of similar symptoms previously, recently being evaluated for this complaint and multiple prior evals for same.     Past Medical History  Diagnosis Date  . Cervical spondylosis without myelopathy   . Lumbosacral spondylosis without myelopathy   . Primary localized osteoarthrosis, lower leg   . Primary localized osteoarthrosis, shoulder region   . Cervicalgia   . Degeneration of thoracic or thoracolumbar intervertebral disc   . Depression   . Chronic pain syndrome   . Chronic neck pain     Past Surgical History  Procedure Laterality Date  . Spine surgery      neck fusion  . Joint replacement      both knees    Family History  Problem Relation Age of Onset  . Cancer Mother 30    cervical cancer  . Cancer Father 20    lung  cancer    History  Substance Use Topics  . Smoking status: Former Games developer  . Smokeless tobacco: Former Neurosurgeon    Quit date: 02/12/1985  . Alcohol Use: No      Review of Systems ROS: Statement: All systems negative except as marked or noted in the HPI; Constitutional: Negative for fever and chills. ; ; Eyes: Negative for eye pain, redness and discharge. ; ; ENMT: Negative for ear pain, hoarseness, nasal congestion, sinus pressure and sore throat.  ; ; Cardiovascular: Negative for chest pain, palpitations, diaphoresis, dyspnea and peripheral edema. ; ; Respiratory: Negative for cough, wheezing and stridor. ; ; Gastrointestinal: Negative for nausea, vomiting, diarrhea, abdominal pain, blood in stool, hematemesis, jaundice and rectal bleeding. . ; ; Genitourinary: Negative for dysuria, flank pain and hematuria. ; ; Musculoskeletal: +chronic neck pain. Negative for back pain. Negative for swelling and trauma.; ; Skin: Negative for pruritus, rash, abrasions, blisters, bruising and skin lesion.; ; Neuro: Negative for headache, lightheadedness and neck stiffness. Negative for weakness, altered level of consciousness , altered mental status, extremity weakness, paresthesias, involuntary movement, seizure and syncope.       Allergies  Zanaflex  Home Medications   Current Outpatient Rx  Name  Route  Sig  Dispense  Refill  . aspirin 81 MG tablet   Oral   Take 81 mg by mouth daily.         . baclofen (LIORESAL) 10 MG tablet   Oral   Take 1 tablet (10 mg total) by mouth at bedtime.   30 each   3   . buPROPion (WELLBUTRIN SR) 150 MG 12 hr tablet   Oral   Take 150 mg by mouth 2 (two) times daily.           . cholecalciferol (VITAMIN D) 1000 UNITS tablet   Oral  Take 1,000 Units by mouth daily.         . diclofenac sodium (VOLTAREN) 1 % GEL   Topical   Apply 1 application topically 3 (three) times daily. Applies to knees         . DULoxetine (CYMBALTA) 60 MG capsule   Oral   Take 60 mg by mouth daily.         . meloxicam (MOBIC) 7.5 MG tablet      7.5 mg.          . nebivolol (BYSTOLIC) 5 MG tablet   Oral   Take 10 mg by mouth daily.          Marland Kitchen omega-3 acid ethyl esters (LOVAZA) 1 G capsule   Oral   Take 2 g by mouth daily.         Marland Kitchen omeprazole (PRILOSEC) 20 MG capsule   Oral   Take 1 capsule by mouth Daily.         Marland Kitchen oxycodone (OXY-IR) 5 MG capsule   Oral   Take 1 capsule (5 mg total) by mouth every  6 (six) hours as needed for pain.   30 capsule   0   . OxyCODONE HCl ER (OXYCONTIN) 30 MG T12A   Oral   Take 60 mg by mouth 3 (three) times daily.   180 each   0   . simvastatin (ZOCOR) 10 MG tablet   Oral   Take 10 mg by mouth at bedtime.         . sucralfate (CARAFATE) 1 G tablet   Oral   Take 1 g by mouth 2 (two) times daily.         . vitamin E 400 UNIT capsule   Oral   Take 400 Units by mouth daily.         . nebivolol (BYSTOLIC) 10 MG tablet      Take one tablet once a day for blood pressure   30 tablet   5     BP 183/95  Pulse 54  Temp(Src) 98.6 F (37 C) (Oral)  Resp 18  SpO2 94%  Physical Exam 1905: Physical examination:  Nursing notes reviewed; Vital signs and O2 SAT reviewed;  Constitutional: Well developed, Well nourished, Well hydrated, In no acute distress; Head:  Normocephalic, atraumatic; Eyes: EOMI, PERRL, No scleral icterus; ENMT: Mouth and pharynx normal, Mucous membranes moist; Neck: +mm spasm, decreased ROM per baseline.; Cardiovascular: Regular rate and rhythm; Respiratory: Breath sounds clear & equal bilaterally, No wheezes.  Speaking full sentences with ease, Normal respiratory effort/excursion; Chest: No deformity, Movement normal; Abdomen: Nondistended;; Extremities: Pulses normal, No obvious deformity.; Neuro: AA&Ox3, Major CN grossly intact.  Speech clear. Walking around ED exam room, gait steady. No gross focal motor or sensory deficits in extremities.; Skin: Color normal, Warm, Dry.   ED Course  Procedures     MDM  MDM Reviewed: previous chart and nursing note Reviewed previous: CT scan and MRI     1915:  Pt states he has enough pain meds at home, is just here today "for an MRI and another opinion."  EPIC chart reviewed:  Pt has had regular appts with his pain management MD (most recently 6 days ago), and has been eval by 2 Neurosurgeon's that have told him there is no indication for surgery at this time. No indication today  for emergent MRI. Pt then stated "well, I'm just going home then."  Does not want any pain meds or  muscle relaxants while he is here.  Long hx of chronic pain with multiple ED visits for same.  Pt endorses acute flair of his usual long standing chronic pain today, no change from his usual chronic pain pattern.  Pt encouraged to f/u with his PMD and Pain Management doctor for good continuity of care and control of his chronic pain.  Verb understanding.        Laray Anger, DO 04/02/13 1520

## 2013-04-04 ENCOUNTER — Encounter (HOSPITAL_COMMUNITY): Payer: Self-pay | Admitting: Physical Medicine and Rehabilitation

## 2013-04-04 ENCOUNTER — Emergency Department (HOSPITAL_COMMUNITY)
Admission: EM | Admit: 2013-04-04 | Discharge: 2013-04-04 | Disposition: A | Discharge status: Home / Self Care | Payer: Medicare Other | Attending: Emergency Medicine | Admitting: Emergency Medicine

## 2013-04-04 DIAGNOSIS — M47817 Spondylosis without myelopathy or radiculopathy, lumbosacral region: Secondary | ICD-10-CM | POA: Diagnosis not present

## 2013-04-04 DIAGNOSIS — Z79899 Other long term (current) drug therapy: Secondary | ICD-10-CM | POA: Diagnosis not present

## 2013-04-04 DIAGNOSIS — G894 Chronic pain syndrome: Secondary | ICD-10-CM | POA: Insufficient documentation

## 2013-04-04 DIAGNOSIS — Z7982 Long term (current) use of aspirin: Secondary | ICD-10-CM | POA: Insufficient documentation

## 2013-04-04 DIAGNOSIS — M19019 Primary osteoarthritis, unspecified shoulder: Secondary | ICD-10-CM | POA: Insufficient documentation

## 2013-04-04 DIAGNOSIS — H9202 Otalgia, left ear: Secondary | ICD-10-CM

## 2013-04-04 DIAGNOSIS — H938X9 Other specified disorders of ear, unspecified ear: Secondary | ICD-10-CM | POA: Insufficient documentation

## 2013-04-04 DIAGNOSIS — I10 Essential (primary) hypertension: Secondary | ICD-10-CM | POA: Diagnosis not present

## 2013-04-04 DIAGNOSIS — F3289 Other specified depressive episodes: Secondary | ICD-10-CM | POA: Insufficient documentation

## 2013-04-04 DIAGNOSIS — M542 Cervicalgia: Secondary | ICD-10-CM | POA: Diagnosis not present

## 2013-04-04 DIAGNOSIS — F329 Major depressive disorder, single episode, unspecified: Secondary | ICD-10-CM | POA: Insufficient documentation

## 2013-04-04 DIAGNOSIS — Z87891 Personal history of nicotine dependence: Secondary | ICD-10-CM | POA: Insufficient documentation

## 2013-04-04 DIAGNOSIS — Z981 Arthrodesis status: Secondary | ICD-10-CM | POA: Diagnosis not present

## 2013-04-04 DIAGNOSIS — M1710 Unilateral primary osteoarthritis, unspecified knee: Secondary | ICD-10-CM | POA: Insufficient documentation

## 2013-04-04 DIAGNOSIS — IMO0002 Reserved for concepts with insufficient information to code with codable children: Secondary | ICD-10-CM | POA: Diagnosis not present

## 2013-04-04 DIAGNOSIS — M47812 Spondylosis without myelopathy or radiculopathy, cervical region: Secondary | ICD-10-CM | POA: Insufficient documentation

## 2013-04-04 DIAGNOSIS — R011 Cardiac murmur, unspecified: Secondary | ICD-10-CM | POA: Diagnosis not present

## 2013-04-04 NOTE — ED Provider Notes (Signed)
Medical screening examination/treatment/procedure(s) were conducted as a shared visit with non-physician practitioner(s) and myself.  I personally evaluated the patient during the encounter.  Left ear canal appears patent with no obvious foreign body noted, left eardrum appears clear.  Hurman Horn, MD 04/10/13 [phone]

## 2013-04-04 NOTE — ED Notes (Signed)
Pt presents to department for evaluation of L ear pain. States he thinks rubber piece from earphone stuck in ear. Denies hearing issues. No signs of acute distress.

## 2013-04-04 NOTE — ED Provider Notes (Signed)
History     CSN: [id number]  Arrival date & time 04/04/13  1127   First MD Initiated Contact with Patient 04/04/13 1149      Chief Complaint  Patient presents with  . Otalgia    (Consider location/radiation/quality/duration/timing/severity/associated sxs/prior treatment) HPI  72 year old male presents for evaluation of possible fb in L ear.  Pt report he usually wears a blue-tooth device on his L ear.  Yesterday he noticed that the rubber piece from the earphone of the blue tooth device was missing.  He asked one of his friend to check his ear sts the notice a clear piece in his ear.  His friend attempt to removing it with a pair of tweezers but pt refused, wanting to come to ER to have it removed instead.  He denies having any hearing changes, ear pain, discharge, headache, neck pain, cp, sob, lightheadedness, dizziness, diaphroresis or rash.  Slept on L side of ear last night without problem.    Past Medical History  Diagnosis Date  . Cervical spondylosis without myelopathy   . Lumbosacral spondylosis without myelopathy   . Primary localized osteoarthrosis, lower leg   . Primary localized osteoarthrosis, shoulder region   . Cervicalgia   . Degeneration of thoracic or thoracolumbar intervertebral disc   . Depression   . Chronic pain syndrome   . Chronic neck pain     Past Surgical History  Procedure Laterality Date  . Spine surgery      neck fusion  . Joint replacement      both knees    Family History  Problem Relation Age of Onset  . Cancer Mother 30    cervical cancer  . Cancer Father 26    lung  cancer    History  Substance Use Topics  . Smoking status: Former Games developer  . Smokeless tobacco: Former Neurosurgeon    Quit date: 02/12/1985  . Alcohol Use: No      Review of Systems  Constitutional: Negative for fever.  HENT: Negative for ear pain, neck pain and tinnitus.   Respiratory: Negative for chest tightness and shortness of breath.   Cardiovascular: Negative  for chest pain.  Skin: Negative for rash and wound.    Allergies  Zanaflex  Home Medications   Current Outpatient Rx  Name  Route  Sig  Dispense  Refill  . aspirin EC 81 MG tablet   Oral   Take 81 mg by mouth daily.         . baclofen (LIORESAL) 10 MG tablet   Oral   Take 10 mg by mouth at bedtime.         Marland Kitchen buPROPion (WELLBUTRIN SR) 150 MG 12 hr tablet   Oral   Take 150 mg by mouth 2 (two) times daily.          . cholecalciferol (VITAMIN D) 1000 UNITS tablet   Oral   Take 1,000 Units by mouth daily.         . diclofenac sodium (VOLTAREN) 1 % GEL   Topical   Apply 1 application topically 3 (three) times daily. Applies to knees         . DULoxetine (CYMBALTA) 60 MG capsule   Oral   Take 60 mg by mouth daily.         . meloxicam (MOBIC) 7.5 MG tablet   Oral   Take 7.5 mg by mouth daily as needed for pain.         Marland Kitchen omega-3  acid ethyl esters (LOVAZA) 1 G capsule   Oral   Take 2 g by mouth daily.         Marland Kitchen omeprazole (PRILOSEC) 20 MG capsule   Oral   Take 20 mg by mouth daily.         Marland Kitchen oxycodone (OXY-IR) 5 MG capsule   Oral   Take 1 capsule (5 mg total) by mouth every 6 (six) hours as needed for pain.   30 capsule   0   . OxyCODONE HCl ER (OXYCONTIN) 30 MG T12A   Oral   Take 60 mg by mouth 3 (three) times daily.         . simvastatin (ZOCOR) 10 MG tablet   Oral   Take 10 mg by mouth at bedtime.         . sucralfate (CARAFATE) 1 G tablet   Oral   Take 1 g by mouth 2 (two) times daily.         . vitamin E 400 UNIT capsule   Oral   Take 400 Units by mouth daily.         . nebivolol (BYSTOLIC) 10 MG tablet      Take one tablet once a day for blood pressure   30 tablet   5   . nebivolol (BYSTOLIC) 5 MG tablet   Oral   Take 10 mg by mouth daily.            BP 193/97  Pulse 72  Temp(Src) 97.9 F (36.6 C)  Resp 20  SpO2 94%  Physical Exam  Nursing note and vitals reviewed. Constitutional: He appears  well-developed and well-nourished.  HENT:  Head: Normocephalic and atraumatic.  Right Ear: External ear normal.  Left Ear: External ear normal.  Ear: bilateral external and internal ear canal without any signs of infection, swelling, or rash.  TM clear and non perforated bilaterally.  No evidence of FB noted, no cerumen build up.    Eyes: Conjunctivae are normal.  Neck: Neck supple. No JVD present.  Cardiovascular: Normal rate and regular rhythm.   Murmur (3/6 systolic heart murmur best heard at 2nd intercostal space on R chest radiates to back.  ) heard. Pulmonary/Chest: Effort normal and breath sounds normal. No respiratory distress.  Abdominal: Soft.  Lymphadenopathy:    He has no cervical adenopathy.  Neurological: He is alert.  Skin: No rash noted.    ED Course  Procedures (including critical care time)  12:47 PM Pt was concern of retain fb in L ear.  Exam unremarkable.  I suspect fb may have fallen off prior to arrival. No other sxs to suggested referred pain, or active cardio/pulmonary issue.  He BP is elevated at 193/97 however no associated complaints worrisome for end organ damage.  Pt aware that he has high BP.  Did take his BP medication this morning.  I have consulted with my attending, who will also evaluate pt.    1:21 PM My attending has evaluated pt and felt he's stable for discharge.  Pt to f/u with PCP for further management of his elevated BP.  Return precaution discussed.    Labs Reviewed - No data to display No results found.   1. Ear discomfort, left       MDM  BP 153/78  Pulse 57  Temp(Src) 97.9 F (36.6 C)  Resp 18  SpO2 97%         Fayrene Helper, PA-C 04/04/13 1324

## 2013-04-07 ENCOUNTER — Telehealth: Payer: Self-pay

## 2013-04-07 NOTE — Telephone Encounter (Signed)
Patient came to the office to follow up on message.  Everything was clarified.

## 2013-04-07 NOTE — Telephone Encounter (Signed)
Patient called about medication refills with dates 60 and 24 but did not give names.

## 2013-04-08 ENCOUNTER — Ambulatory Visit: Payer: Medicare Other | Attending: Physical Medicine & Rehabilitation | Admitting: Physical Therapy

## 2013-04-08 DIAGNOSIS — IMO0001 Reserved for inherently not codable concepts without codable children: Secondary | ICD-10-CM | POA: Insufficient documentation

## 2013-04-08 DIAGNOSIS — R269 Unspecified abnormalities of gait and mobility: Secondary | ICD-10-CM | POA: Insufficient documentation

## 2013-04-15 ENCOUNTER — Ambulatory Visit: Payer: Medicare Other | Admitting: *Deleted

## 2013-04-15 DIAGNOSIS — IMO0001 Reserved for inherently not codable concepts without codable children: Secondary | ICD-10-CM | POA: Diagnosis not present

## 2013-04-15 DIAGNOSIS — R269 Unspecified abnormalities of gait and mobility: Secondary | ICD-10-CM | POA: Diagnosis not present

## 2013-04-15 NOTE — Progress Notes (Signed)
Updated correction to medications ordered at this visit 03/24/13.  Oxycontin 30 mg # 180 and Oxy IR 5 mg # 30 tablets given due to Mr [facility] having #56 tablets left from previous rx. (He usually gets # 90 5 mg Oxy ir)

## 2013-04-21 ENCOUNTER — Encounter: Payer: Medicare Other | Attending: Physical Medicine and Rehabilitation | Admitting: Physical Medicine & Rehabilitation

## 2013-04-21 ENCOUNTER — Encounter: Payer: Self-pay | Admitting: Physical Medicine & Rehabilitation

## 2013-04-21 VITALS — BP 172/72 | HR 55 | Resp 14 | Ht 67.0 in | Wt 225.0 lb

## 2013-04-21 DIAGNOSIS — F3289 Other specified depressive episodes: Secondary | ICD-10-CM

## 2013-04-21 DIAGNOSIS — M19019 Primary osteoarthritis, unspecified shoulder: Secondary | ICD-10-CM

## 2013-04-21 DIAGNOSIS — M961 Postlaminectomy syndrome, not elsewhere classified: Secondary | ICD-10-CM | POA: Diagnosis not present

## 2013-04-21 DIAGNOSIS — Z79899 Other long term (current) drug therapy: Secondary | ICD-10-CM

## 2013-04-21 DIAGNOSIS — Z5181 Encounter for therapeutic drug level monitoring: Secondary | ICD-10-CM | POA: Diagnosis not present

## 2013-04-21 DIAGNOSIS — R269 Unspecified abnormalities of gait and mobility: Secondary | ICD-10-CM | POA: Diagnosis not present

## 2013-04-21 DIAGNOSIS — M25569 Pain in unspecified knee: Secondary | ICD-10-CM | POA: Diagnosis not present

## 2013-04-21 DIAGNOSIS — F329 Major depressive disorder, single episode, unspecified: Secondary | ICD-10-CM

## 2013-04-21 DIAGNOSIS — M542 Cervicalgia: Secondary | ICD-10-CM | POA: Diagnosis not present

## 2013-04-21 MED ORDER — BACLOFEN 10 MG PO TABS: 5.0000 mg | ORAL_TABLET | Freq: Two times a day (BID) | ORAL | Status: DC | PRN

## 2013-04-21 MED ORDER — OXYCODONE HCL 5 MG PO CAPS: 5.0000 mg | ORAL_CAPSULE | Freq: Four times a day (QID) | ORAL | Status: DC | PRN

## 2013-04-21 MED ORDER — OXYCODONE HCL ER 30 MG PO T12A: 60.0000 mg | EXTENDED_RELEASE_TABLET | Freq: Three times a day (TID) | ORAL | Status: DC

## 2013-04-21 NOTE — Progress Notes (Signed)
Subjective:    Patient ID: [name], male    DOB: December 05, 1941, 72 y.o.   MRN: [id number]  HPI  Malikhi is back regarding his chronic neck pain. He felt that the baclofen was helpful for his spasms in the neck and even in his lower body.   He has been to outpt PT for physical balance and vestibular assessment. He felt that initially, it was helpful. They worked on his neck last Wednesday and his neck tightened up and his pain increased. He cancelled his next visit which was today.   He continues on his narcotics which hold his pain at a tolerable level.    Pain Inventory Average Pain 7 Pain Right Now 8 My pain is burning and tingling  In the last 24 hours, has pain interfered with the following? General activity 8 Relation with others 8 Enjoyment of life 9 What TIME of day is your pain at its worst? all the time Sleep (in general) Fair  Pain is worse with: some activites Pain improves with: rest and medication Relief from Meds: 5  Mobility use a cane how many minutes can you walk? 10-15 ability to climb steps?  yes do you drive?  yes Do you have any goals in this area?  yes  Function retired I need assistance with the following:  shopping  Neuro/Psych trouble walking  Prior Studies Any changes since last visit?  no  Physicians involved in your care Any changes since last visit?  no   Family History  Problem Relation Age of Onset  . Cancer Mother 30    cervical cancer  . Cancer Father 66    lung  cancer   History   Social History  . Marital Status: Single    Spouse Name: N/A    Number of Children: N/A  . Years of Education: N/A   Social History Main Topics  . Smoking status: Former Games developer  . Smokeless tobacco: Former Neurosurgeon    Quit date: 02/12/1985  . Alcohol Use: No  . Drug Use: No  . Sexually Active: None   Other Topics Concern  . None   Social History Narrative  . None   Past Surgical History  Procedure Laterality Date  . Spine surgery       neck fusion  . Joint replacement      both knees   Past Medical History  Diagnosis Date  . Cervical spondylosis without myelopathy   . Lumbosacral spondylosis without myelopathy   . Primary localized osteoarthrosis, lower leg   . Primary localized osteoarthrosis, shoulder region   . Cervicalgia   . Degeneration of thoracic or thoracolumbar intervertebral disc   . Depression   . Chronic pain syndrome   . Chronic neck pain    BP 172/72  Pulse 55  Resp 14  Ht 5\' 7"  (1.702 m)  Wt 225 lb (102.059 kg)  BMI 35.23 kg/m2  SpO2 95%     Review of Systems  HENT: Positive for neck pain.   Musculoskeletal: Positive for back pain.  All other systems reviewed and are negative.       Objective:   Physical Exam Constitutional: He is oriented to person, place, and time. He appears well-developed and well-nourished.  HENT:  Head: Normocephalic and atraumatic.  Voice raspy/hoarse. Occasionally clearing throat.  Eyes: Conjunctivae and EOM are normal. Pupils are equal, round, and reactive to light.  Neck: Normal range of motion.  Cardiovascular: Normal rate and regular rhythm.  Pulmonary/Chest: Effort  normal and breath sounds normal.  Abdominal: Soft. Bowel sounds are normal.  Musculoskeletal:  Right shoulder pain with palpation particularly in the lateral and anteriorly. Chronic deformities noted from prior surgeries. Pain with ER/IR/abduction right greater than left. Neck rom is severely limited.  Neurological: He is alert and oriented to person, place, and time. No cranial nerve deficit. i saw no nystagmus to any field, i could not turn head however.  Reflex Scores:  Tricep reflexes are 1+ on the right side and 1+ on the left side.  Bicep reflexes are 1+ on the right side and 1+ on the left side.  Brachioradialis reflexes are 1+ on the right side and 1+ on the left side.  Patellar reflexes are 1+ on the right side and 1+ on the left side.  Achilles reflexes are 1+ on the right  side and 1+ on the left side. Strength fairly well preserved in all 4 limbs.   sensory loss in anterior thighs. Romberg test was positive. Seemed to be scanning better. No nystagmus seen Psychiatric: His speech is normal. He is slowed. Cognition and memory are normal. He expresses impulsivity. He is less irritable today.   Assessment & Plan:   ASSESSMENT:  1. Chronic cervicalgia status post multiple cervical spine surgeries.  Likely scar tissue component with some neuropathic pain as well. There are  myofascial components also.  2.Recent falls with potential concussion in January. I see no signs of BPPV on exam. I think his gait disorder is multifactorial related to the above as well as his multiple other arthridities and medications.  3. Depression/anxiety.  4. Shoulder pain is more likely to be related to degenerative arthritis and multiple surgical interventions than to his c-spine base on exam and imaging.   PLAN:  1. He has had an esophageal work up in the past. 2 I encouraged him to return to neurorehab for balance assessment and recommendations. His gait problems are multifactorial. They will need to work on exercises without excessive cervical ROM. 3. Refilled OxyContin 30 mg q.8 hours #180 and oxycodone IR 10 mg, #90.  4. I will have him follow up with my PA in one month.  5. Pt has reviewed options with Dr. Channing Mutters and Dr. Danielle Dess who had nothing to recommend from a surgical standpoint. He continues to look for a specific, singular cause. We have described at length that the issues are multifactorial.  6. Increase baclofen low dose to be used at night for neck spasms. I do not want him taking it throughout the day. Will observe for tolerance and effect

## 2013-04-21 NOTE — Patient Instructions (Signed)
I WOULD LIKE FOR YOU TO RETURN TO REHAB.  WORK ON GENTLE NECK RANGE OF MOTION AS MUCH AS YOU CAN TOLERATE.

## 2013-04-22 ENCOUNTER — Ambulatory Visit: Payer: Medicare Other | Admitting: *Deleted

## 2013-04-29 ENCOUNTER — Ambulatory Visit: Payer: Medicare Other | Admitting: *Deleted

## 2013-05-06 ENCOUNTER — Ambulatory Visit: Payer: Medicare Other | Attending: Physical Medicine & Rehabilitation | Admitting: *Deleted

## 2013-05-06 DIAGNOSIS — R269 Unspecified abnormalities of gait and mobility: Secondary | ICD-10-CM | POA: Insufficient documentation

## 2013-05-06 DIAGNOSIS — IMO0001 Reserved for inherently not codable concepts without codable children: Secondary | ICD-10-CM | POA: Insufficient documentation

## 2013-05-07 ENCOUNTER — Other Ambulatory Visit (HOSPITAL_BASED_OUTPATIENT_CLINIC_OR_DEPARTMENT_OTHER): Payer: Self-pay | Admitting: Internal Medicine

## 2013-05-19 ENCOUNTER — Telehealth: Payer: Self-pay | Admitting: *Deleted

## 2013-05-19 DIAGNOSIS — M542 Cervicalgia: Secondary | ICD-10-CM

## 2013-05-19 DIAGNOSIS — Z79899 Other long term (current) drug therapy: Secondary | ICD-10-CM

## 2013-05-19 DIAGNOSIS — F329 Major depressive disorder, single episode, unspecified: Secondary | ICD-10-CM

## 2013-05-19 DIAGNOSIS — Z5181 Encounter for therapeutic drug level monitoring: Secondary | ICD-10-CM

## 2013-05-19 DIAGNOSIS — R269 Unspecified abnormalities of gait and mobility: Secondary | ICD-10-CM

## 2013-05-19 DIAGNOSIS — M25569 Pain in unspecified knee: Secondary | ICD-10-CM

## 2013-05-19 DIAGNOSIS — M961 Postlaminectomy syndrome, not elsewhere classified: Secondary | ICD-10-CM

## 2013-05-19 NOTE — Telephone Encounter (Signed)
He may pick it up tomorrow. Just remind me. thanks

## 2013-05-19 NOTE — Telephone Encounter (Signed)
Refill oxycodone 5 mg.  (last rx 04/21/13 # 90 1 q 6 hr prn)  Isn't this too early?  Note says follow up with PA in one month, but schedule says PA in 2 month 06/22/13. Please advise

## 2013-05-20 MED ORDER — OXYCODONE HCL 5 MG PO CAPS: 5.0000 mg | ORAL_CAPSULE | Freq: Four times a day (QID) | ORAL | Status: DC | PRN

## 2013-05-20 NOTE — Telephone Encounter (Signed)
Refill printed to be signed.

## 2013-05-20 NOTE — Telephone Encounter (Signed)
Patient aware script is ready for pick up 

## 2013-06-02 ENCOUNTER — Encounter: Payer: Self-pay | Admitting: Internal Medicine

## 2013-06-04 ENCOUNTER — Telehealth: Payer: Self-pay | Admitting: *Deleted

## 2013-06-04 DIAGNOSIS — Z79899 Other long term (current) drug therapy: Secondary | ICD-10-CM

## 2013-06-04 DIAGNOSIS — M961 Postlaminectomy syndrome, not elsewhere classified: Secondary | ICD-10-CM

## 2013-06-04 DIAGNOSIS — M25569 Pain in unspecified knee: Secondary | ICD-10-CM

## 2013-06-04 DIAGNOSIS — Z5181 Encounter for therapeutic drug level monitoring: Secondary | ICD-10-CM

## 2013-06-04 DIAGNOSIS — M542 Cervicalgia: Secondary | ICD-10-CM

## 2013-06-04 DIAGNOSIS — R269 Unspecified abnormalities of gait and mobility: Secondary | ICD-10-CM

## 2013-06-04 DIAGNOSIS — F329 Major depressive disorder, single episode, unspecified: Secondary | ICD-10-CM

## 2013-06-04 NOTE — Telephone Encounter (Signed)
Requesting refill on Oxycontin.  Last fill 04/21/13  I called him back to clarify if it was oxycontin or oxycodone (filled 05/20/13) he was asking about. I had to leave message to [facility]

## 2013-06-05 ENCOUNTER — Encounter: Payer: Self-pay | Admitting: *Deleted

## 2013-06-05 ENCOUNTER — Other Ambulatory Visit: Payer: Self-pay | Admitting: *Deleted

## 2013-06-05 MED ORDER — OXYCODONE HCL ER 30 MG PO T12A: 60.0000 mg | EXTENDED_RELEASE_TABLET | Freq: Three times a day (TID) | ORAL | Status: DC

## 2013-06-05 NOTE — Telephone Encounter (Signed)
RX printed for Dr Riley Kill to sign.

## 2013-06-05 NOTE — Telephone Encounter (Signed)
Patient aware script is ready for pick up 

## 2013-06-08 DIAGNOSIS — M25519 Pain in unspecified shoulder: Secondary | ICD-10-CM | POA: Diagnosis not present

## 2013-06-09 ENCOUNTER — Encounter: Payer: Medicare Other | Admitting: Internal Medicine

## 2013-06-22 ENCOUNTER — Ambulatory Visit: Payer: Medicare Other | Admitting: Physical Medicine and Rehabilitation

## 2013-06-25 ENCOUNTER — Encounter
Payer: Medicare Other | Attending: Physical Medicine and Rehabilitation | Admitting: Physical Medicine and Rehabilitation

## 2013-06-30 ENCOUNTER — Other Ambulatory Visit: Payer: Self-pay | Admitting: Internal Medicine

## 2014-01-18 DIAGNOSIS — M542 Cervicalgia: Secondary | ICD-10-CM | POA: Diagnosis not present

## 2014-01-18 DIAGNOSIS — M4802 Spinal stenosis, cervical region: Secondary | ICD-10-CM | POA: Diagnosis not present

## 2014-01-18 DIAGNOSIS — Z981 Arthrodesis status: Secondary | ICD-10-CM | POA: Diagnosis not present

## 2014-02-16 DIAGNOSIS — H1045 Other chronic allergic conjunctivitis: Secondary | ICD-10-CM | POA: Diagnosis not present

## 2014-02-16 DIAGNOSIS — H04129 Dry eye syndrome of unspecified lacrimal gland: Secondary | ICD-10-CM | POA: Diagnosis not present

## 2014-02-16 DIAGNOSIS — H40019 Open angle with borderline findings, low risk, unspecified eye: Secondary | ICD-10-CM | POA: Diagnosis not present

## 2014-02-16 DIAGNOSIS — Z961 Presence of intraocular lens: Secondary | ICD-10-CM | POA: Diagnosis not present

## 2014-03-26 DIAGNOSIS — M25519 Pain in unspecified shoulder: Secondary | ICD-10-CM | POA: Diagnosis not present

## 2014-06-14 DIAGNOSIS — Z0000 Encounter for general adult medical examination without abnormal findings: Secondary | ICD-10-CM | POA: Diagnosis not present

## 2014-12-02 DIAGNOSIS — M12811 Other specific arthropathies, not elsewhere classified, right shoulder: Secondary | ICD-10-CM | POA: Diagnosis not present

## 2014-12-02 DIAGNOSIS — M12812 Other specific arthropathies, not elsewhere classified, left shoulder: Secondary | ICD-10-CM | POA: Diagnosis not present

## 2015-06-06 DIAGNOSIS — M549 Dorsalgia, unspecified: Secondary | ICD-10-CM | POA: Diagnosis not present

## 2015-06-06 DIAGNOSIS — R079 Chest pain, unspecified: Secondary | ICD-10-CM | POA: Diagnosis not present

## 2015-06-06 DIAGNOSIS — Z8739 Personal history of other diseases of the musculoskeletal system and connective tissue: Secondary | ICD-10-CM | POA: Diagnosis not present

## 2015-06-06 DIAGNOSIS — Z041 Encounter for examination and observation following transport accident: Secondary | ICD-10-CM | POA: Diagnosis not present

## 2015-06-06 DIAGNOSIS — S0990XA Unspecified injury of head, initial encounter: Secondary | ICD-10-CM | POA: Diagnosis not present

## 2015-06-06 DIAGNOSIS — M542 Cervicalgia: Secondary | ICD-10-CM | POA: Diagnosis not present

## 2015-06-06 DIAGNOSIS — R0781 Pleurodynia: Secondary | ICD-10-CM | POA: Diagnosis not present

## 2015-06-06 DIAGNOSIS — R51 Headache: Secondary | ICD-10-CM | POA: Diagnosis not present

## 2015-08-05 DIAGNOSIS — Z961 Presence of intraocular lens: Secondary | ICD-10-CM | POA: Diagnosis not present

## 2015-08-05 DIAGNOSIS — H04123 Dry eye syndrome of bilateral lacrimal glands: Secondary | ICD-10-CM | POA: Diagnosis not present

## 2015-08-05 DIAGNOSIS — H01025 Squamous blepharitis left lower eyelid: Secondary | ICD-10-CM | POA: Diagnosis not present

## 2015-08-05 DIAGNOSIS — H01024 Squamous blepharitis left upper eyelid: Secondary | ICD-10-CM | POA: Diagnosis not present

## 2015-08-05 DIAGNOSIS — H01021 Squamous blepharitis right upper eyelid: Secondary | ICD-10-CM | POA: Diagnosis not present

## 2015-08-05 DIAGNOSIS — H01022 Squamous blepharitis right lower eyelid: Secondary | ICD-10-CM | POA: Diagnosis not present

## 2015-08-05 DIAGNOSIS — H10413 Chronic giant papillary conjunctivitis, bilateral: Secondary | ICD-10-CM | POA: Diagnosis not present

## 2015-08-30 ENCOUNTER — Encounter: Payer: Self-pay | Admitting: Diagnostic Neuroimaging

## 2015-08-30 ENCOUNTER — Ambulatory Visit (INDEPENDENT_AMBULATORY_CARE_PROVIDER_SITE_OTHER): Payer: Medicare Other | Admitting: Diagnostic Neuroimaging

## 2015-08-30 VITALS — BP 144/88 | HR 52 | Ht 68.5 in | Wt 204.0 lb

## 2015-08-30 DIAGNOSIS — R413 Other amnesia: Secondary | ICD-10-CM

## 2015-08-30 NOTE — Progress Notes (Signed)
[facility]  PATIENT: [name] DOB: [birth year]  REFERRING CLINICIAN: F Salman HISTORY FROM: patient  REASON FOR VISIT: new consult    HISTORICAL  CHIEF COMPLAINT:  Chief Complaint  Patient presents with  . Dementia    rm 6, New Patient, unsteady gait, dizziness    HISTORY OF PRESENT ILLNESS:   74 year old right-handed male referred to neurology clinic today for evaluation of possible dementia. Patient presents to clinic alone. I asked patient why he was referred here and patient gave a long-winded, tangential response which did not answer my question. He mentioned that his [place] doctor told him that there would be a 30 day waiting period and therefore he requested referral to a different clinic. I asked patient if he had any memory problems and he adamantly denied. Patient was somewhat belligerent towards my nurse when she administered MMSE testing. I asked patient if he spends time with any family or friends. He states he has a girlfriend and also sees his sister who lives below him in their building on a daily basis. I asked if he could return to another follow-up visit with them and he became agitated towards me.  Patient also was quite defensive about his driving privileges even before I brought up the issue of driving.  Patient was accusatory towards referring clinic doctor and referring PA.    REVIEW OF SYSTEMS: Full 14 system review of systems performed and notable only for joint pain headache weight loss fatigue chest pain murmur swelling in legs.  ALLERGIES: Allergies  Allergen Reactions  . Zanaflex [Tizanidine Hcl]     unknown    HOME MEDICATIONS: Outpatient Prescriptions Prior to Visit  Medication Sig Dispense Refill  . buPROPion (WELLBUTRIN SR) 150 MG 12 hr tablet Take 150 mg by mouth 2 (two) times daily.     Marland Kitchen omeprazole (PRILOSEC) 20 MG capsule TAKE 2 CAPSULES DAILY TO REDUCE STOMACH ACID AND HELP HEARTBURN 60 capsule 6  . simvastatin  (ZOCOR) 10 MG tablet Take 10 mg by mouth at bedtime.    . sucralfate (CARAFATE) 1 G tablet TAKE 1 TABLET BY MOUTH BEFORE MEALS AND AT BEDTIME TO PROTECT STOMACH AND ESOPHAGUS 120 tablet 3  . aspirin EC 81 MG tablet Take 81 mg by mouth daily.    . baclofen (LIORESAL) 10 MG tablet Take 0.5-1 tablets (5-10 mg total) by mouth every 12 (twelve) hours as needed. (Patient not taking: Reported on 08/30/2015) 60 each 2  . cholecalciferol (VITAMIN D) 1000 UNITS tablet Take 1,000 Units by mouth daily.    . diclofenac sodium (VOLTAREN) 1 % GEL Apply 1 application topically 3 (three) times daily. Applies to knees    . DULoxetine (CYMBALTA) 60 MG capsule Take 60 mg by mouth daily.    . meloxicam (MOBIC) 7.5 MG tablet Take 7.5 mg by mouth daily as needed for pain.    . nebivolol (BYSTOLIC) 5 MG tablet Take 10 mg by mouth daily.     Marland Kitchen omega-3 acid ethyl esters (LOVAZA) 1 G capsule Take 2 g by mouth daily.    Marland Kitchen oxycodone (OXY-IR) 5 MG capsule Take 1 capsule (5 mg total) by mouth every 6 (six) hours as needed for pain. (Patient not taking: Reported on 08/30/2015) 90 capsule 0  . OxyCODONE HCl ER (OXYCONTIN) 30 MG T12A Take 60 mg by mouth 3 (three) times daily. (Patient not taking: Reported on 08/30/2015) 180 each 0  . vitamin E 400 UNIT capsule Take 400 Units by mouth daily.  No facility-administered medications prior to visit.    PAST MEDICAL HISTORY: Past Medical History  Diagnosis Date  . Cervical spondylosis without myelopathy   . Lumbosacral spondylosis without myelopathy   . Primary localized osteoarthrosis, lower leg   . Primary localized osteoarthrosis, shoulder region   . Cervicalgia   . Degeneration of thoracic or thoracolumbar intervertebral disc   . Depression   . Chronic pain syndrome   . Chronic neck pain   . Impacted cerumen 02/26/2013  . Acute pain due to trauma   . Encounter for removal of sutures 01/07/2013  . Other and unspecified open wound of head without mention of complication     . Throat pain   . Abrasion or friction burn of other, multiple, and unspecified sites, without mention of infection   . Other dysphagia   . Otalgia, unspecified   . Chronic pharyngitis   . Other diseases of vocal cords   . Retention of urine, unspecified   . Syncope and collapse   . Dysphagia, pharyngeal phase   . Unspecified sinusitis (chronic)   . Lumbago   . Iron deficiency anemia, unspecified   . Nonspecific abnormal results of pulmonary system function study   . Depressive disorder, not elsewhere classified   . Reflux esophagitis   . Spinal stenosis, unspecified region other than cervical   . Backache, unspecified   . Lumbosacral spondylosis without myelopathy   . Cervical spondylosis with myelopathy   . Hemorrhage of gastrointestinal tract, unspecified   . Pain in joint, shoulder region   . Other and unspecified disc disorder of cervical region   . Unspecified constipation   . Muscle weakness (generalized)   . Abdominal pain, generalized   . Carpal tunnel syndrome   . Unspecified glaucoma   . Dysuria   . Other and unspecified hyperlipidemia   . Unspecified essential hypertension   . Unspecified arthropathy, lower leg   . Other malaise and fatigue     PAST SURGICAL HISTORY: Past Surgical History  Procedure Laterality Date  . Spine surgery      neck fusion  . Joint replacement      both knees  . Rotator cuff left shoulder  2001    DR MURPHY   . Shoulder open rotator cuff repair  2006    DR [facility]  . Left knee replacement      DR ALUSIO  . Left transverse carpal ligament  01/06/2008  . Cerviacal spine (4x)      DR MARK ROY   . Bilateral cataract surgery  2009    DR GROAT   . Colonoscopy  2007    DR HENSEL     FAMILY HISTORY: Family History  Problem Relation Age of Onset  . Cancer Mother 30    cervical cancer  . Cancer Father 85    lung  cancer    SOCIAL HISTORY:  Social History   Social History  . Marital Status: Single    Spouse Name:  N/A  . Number of Children: 0  . Years of Education: GED   Occupational History  . retired     10 years in service, [employer] History Main Topics  . Smoking status: Former Smoker    Quit date: 08/29/1985  . Smokeless tobacco: Former Systems developer    Quit date: 02/12/1985  . Alcohol Use: No  . Drug Use: No  . Sexual Activity: Not on file   Other Topics Concern  . Not on file  Social History Narrative   Lives alone   caffeine use - none     PHYSICAL EXAM  GENERAL EXAM/CONSTITUTIONAL: Vitals:  Filed Vitals:   08/30/15 0938  BP: 144/88  Pulse: 52  Height: 5' 8.5" (1.74 m)  Weight: 204 lb (92.534 kg)     Body mass index is 30.56 kg/(m^2).  Visual Acuity Screening   Right eye Left eye Both eyes  Without correction: 20/40 20/30   With correction:        Patient is in no distress; well developed, nourished and groomed  DECR ROM IN NECK  CARDIOVASCULAR:  Examination of carotid arteries is normal; no carotid bruits  Regular rate and rhythm --> + SYSTOLIC MURMUR  Examination of peripheral vascular system by observation and palpation is normal  EYES:  Ophthalmoscopic exam of optic discs and posterior segments is normal; no papilledema or hemorrhages  MUSCULOSKELETAL:  Gait, strength, tone, movements noted in Neurologic exam below  NEUROLOGIC: MENTAL STATUS:  MMSE - Mini Mental State Exam 08/30/2015  Not completed: Unable to complete;Refused  Orientation to time 2  Orientation to Place 4  Registration 3    awake, alert, oriented to person; "winter, august, Tuesday, 2016", "[place], [facility] [place]"   PERSEVERATIVE ON SOME TOPICS; TANGENTIAL AT OTHER TIMES  HE DOES NOT KNOW WHY HE IS HERE  DECLINED FURTHER MMSE TESTING  recent and remote memory intact BY CONVERSATION  normal attention and concentration BY CONVERSATION  language fluent, comprehension intact, naming intact  fund of knowledge appropriate  CRANIAL NERVE:   2nd -  no papilledema on fundoscopic exam  2nd, 3rd, 4th, 6th - pupils equal and reactive to light, visual fields full to confrontation, extraocular muscles intact, no nystagmus  5th - facial sensation symmetric  7th - facial strength symmetric  8th - hearing intact  9th - palate elevates symmetrically, uvula midline  11th - shoulder shrug symmetric  12th - tongue protrusion midline  MOTOR:   normal bulk and tone, full strength in the BUE, BLE  SENSORY:   normal and symmetric to light touch, pinprick, temperature, vibration   COORDINATION:   finger-nose-finger, fine finger movements normal  REFLEXES:   deep tendon reflexes TRACE and symmetric  GAIT/STATION:   narrow based gait; ANTALGIC GAIT    DIAGNOSTIC DATA (LABS, IMAGING, TESTING) - I reviewed patient records, labs, notes, testing and imaging myself where available.  Lab Results  Component Value Date   WBC 6.9 03/12/2013   HGB 12.7 03/12/2013   HCT 38.1 03/12/2013   MCV 91 03/12/2013   PLT 297 04/26/2010      Component Value Date/Time   NA 141 03/12/2013 0937   NA 138 04/26/2010 0515   K 4.8 03/12/2013 0937   CL 101 03/12/2013 0937   CO2 30* 03/12/2013 0937   GLUCOSE 88 03/12/2013 0937   GLUCOSE 81 04/26/2010 0515   BUN 12 03/12/2013 0937   BUN 8 04/26/2010 0515   CREATININE 0.77 03/12/2013 0937   CALCIUM 9.3 03/12/2013 0937   PROT 6.6 03/12/2013 0937   PROT 6.3 04/14/2008 1415   ALBUMIN 2.9* 04/14/2008 1415   AST 23 03/12/2013 0937   ALT 17 03/12/2013 0937   ALKPHOS 100 03/12/2013 0937   BILITOT 0.4 03/12/2013 0937   GFRNONAA 92 03/12/2013 0937   GFRAA 106 03/12/2013 0937   Lab Results  Component Value Date   CHOL 151 03/07/2007   HDL 45 03/07/2007   LDLCALC 81 03/07/2007   TRIG 127 03/07/2007  CHOLHDL 3.4 Ratio 03/07/2007   Lab Results  Component Value Date   HGBA1C 5.3 03/12/2013   Lab Results  Component Value Date   VITAMINB12 287 04/25/2010   Lab Results  Component Value Date    TSH 1.319 *Test methodology is 3rd generation TSH* 04/24/2010    03/20/13 CT head  1. No acute intracranial abnormalities. 2. Small vessel ischemic disease and brain atrophy.    ASSESSMENT AND PLAN  74 y.o. year old male here with memory and cognitive deficits, but alone for this visit. Need to obtain collateral history for further evaluation. Patient not cooperative for evaluation.    Dx: probable dementia + medication side effect + mood disorder   PLAN: - need to obtain collateral history as patient not cooperative for comprehensive mental status / cognitive testing today - may need to develop plan for safety and supervision in coordination with PCP and patient's family - offered MRI testing --> patient declined - offered follow up appointment and encouraged him to bring his family or friends --> patient declined  Return if symptoms worsen or fail to improve, for return to PCP.    Penni Bombard, MD 12/19/1115, 35:67 AM Certified in Neurology, Neurophysiology and Neuroimaging  [facility] [address] [place], [place] [address] [phone]

## 2015-08-30 NOTE — Patient Instructions (Signed)
FOLLOW UP WITH PCP (DR. SALMAN).

## 2015-12-28 DIAGNOSIS — M12812 Other specific arthropathies, not elsewhere classified, left shoulder: Secondary | ICD-10-CM | POA: Diagnosis not present

## 2015-12-28 DIAGNOSIS — M12811 Other specific arthropathies, not elsewhere classified, right shoulder: Secondary | ICD-10-CM | POA: Diagnosis not present

## 2016-01-31 ENCOUNTER — Emergency Department (HOSPITAL_COMMUNITY)
Admission: EM | Admit: 2016-01-31 | Discharge: 2016-01-31 | Disposition: A | Discharge status: Home / Self Care | Payer: Medicare Other | Attending: Emergency Medicine | Admitting: Emergency Medicine

## 2016-01-31 ENCOUNTER — Encounter (HOSPITAL_COMMUNITY): Payer: Self-pay

## 2016-01-31 DIAGNOSIS — F329 Major depressive disorder, single episode, unspecified: Secondary | ICD-10-CM | POA: Diagnosis not present

## 2016-01-31 DIAGNOSIS — M542 Cervicalgia: Secondary | ICD-10-CM | POA: Insufficient documentation

## 2016-01-31 DIAGNOSIS — Z7982 Long term (current) use of aspirin: Secondary | ICD-10-CM | POA: Insufficient documentation

## 2016-01-31 DIAGNOSIS — Z79899 Other long term (current) drug therapy: Secondary | ICD-10-CM | POA: Insufficient documentation

## 2016-01-31 DIAGNOSIS — Z87891 Personal history of nicotine dependence: Secondary | ICD-10-CM | POA: Diagnosis not present

## 2016-01-31 DIAGNOSIS — M47817 Spondylosis without myelopathy or radiculopathy, lumbosacral region: Secondary | ICD-10-CM | POA: Insufficient documentation

## 2016-01-31 DIAGNOSIS — G8929 Other chronic pain: Secondary | ICD-10-CM | POA: Diagnosis not present

## 2016-01-31 DIAGNOSIS — E785 Hyperlipidemia, unspecified: Secondary | ICD-10-CM | POA: Insufficient documentation

## 2016-01-31 DIAGNOSIS — Z862 Personal history of diseases of the blood and blood-forming organs and certain disorders involving the immune mechanism: Secondary | ICD-10-CM | POA: Insufficient documentation

## 2016-01-31 DIAGNOSIS — M19019 Primary osteoarthritis, unspecified shoulder: Secondary | ICD-10-CM | POA: Insufficient documentation

## 2016-01-31 DIAGNOSIS — M549 Dorsalgia, unspecified: Secondary | ICD-10-CM | POA: Diagnosis not present

## 2016-01-31 DIAGNOSIS — M47812 Spondylosis without myelopathy or radiculopathy, cervical region: Secondary | ICD-10-CM | POA: Insufficient documentation

## 2016-01-31 DIAGNOSIS — Z791 Long term (current) use of non-steroidal anti-inflammatories (NSAID): Secondary | ICD-10-CM | POA: Insufficient documentation

## 2016-01-31 DIAGNOSIS — I10 Essential (primary) hypertension: Secondary | ICD-10-CM | POA: Insufficient documentation

## 2016-01-31 DIAGNOSIS — K210 Gastro-esophageal reflux disease with esophagitis: Secondary | ICD-10-CM | POA: Insufficient documentation

## 2016-01-31 DIAGNOSIS — G894 Chronic pain syndrome: Secondary | ICD-10-CM | POA: Insufficient documentation

## 2016-01-31 DIAGNOSIS — M1710 Unilateral primary osteoarthritis, unspecified knee: Secondary | ICD-10-CM | POA: Insufficient documentation

## 2016-01-31 MED ORDER — KETOROLAC TROMETHAMINE 60 MG/2ML IM SOLN
60.0000 mg | Freq: Once | INTRAMUSCULAR | Status: AC
  Administered 2016-01-31: 60 mg via INTRAMUSCULAR
  Filled 2016-01-31: qty 2

## 2016-01-31 NOTE — ED Provider Notes (Signed)
CSN: [id number]     Arrival date & time 01/31/16  [id number] History  By signing my name below, I, Eustaquio Maize, attest that this documentation has been prepared under the direction and in the presence of Shantasia Hunnell, MD. Electronically Signed: Eustaquio Maize, ED Scribe. 01/31/2016. 3:56 AM.   Chief Complaint  Patient presents with  . Pain  . Neck Pain  . Arm Pain   Patient is a 75 y.o. male presenting with neck injury. The history is provided by the patient. No language interpreter was used.  Neck Injury This is a chronic problem. The current episode started more than 1 week ago (6-7 years ago). The problem occurs constantly. The problem has not changed since onset.Pertinent negatives include no chest pain, no abdominal pain, no headaches and no shortness of breath. Nothing aggravates the symptoms. Nothing relieves the symptoms. Treatments tried: methadone 4 times a day. The treatment provided no relief.     HPI Comments: [name] is a 75 y.o. male brought in by ambulance, with hx chronic pain who presents to the Emergency Department complaining of gradual onset, constant, worsening, neck pain that began earlier today. No recent injury, trauma, or fall. Pt has hx of chronic neck pain for the past 6-7 years. He states that he has been taking Methadone for his pain and usually takes it 4 times per day. He reports that he has been on steroids in the past for his neck but is not on it currently. Denies weakness, numbness, tingling, or any other associated symptoms.   Past Medical History  Diagnosis Date  . Cervical spondylosis without myelopathy   . Lumbosacral spondylosis without myelopathy   . Primary localized osteoarthrosis, lower leg   . Primary localized osteoarthrosis, shoulder region   . Cervicalgia   . Degeneration of thoracic or thoracolumbar intervertebral disc   . Depression   . Chronic pain syndrome   . Chronic neck pain   . Impacted cerumen 02/26/2013  . Acute pain due to  trauma   . Encounter for removal of sutures 01/07/2013  . Other and unspecified open wound of head without mention of complication   . Throat pain   . Abrasion or friction burn of other, multiple, and unspecified sites, without mention of infection   . Other dysphagia   . Otalgia, unspecified   . Chronic pharyngitis   . Other diseases of vocal cords   . Retention of urine, unspecified   . Syncope and collapse   . Dysphagia, pharyngeal phase   . Unspecified sinusitis (chronic)   . Lumbago   . Iron deficiency anemia, unspecified   . Nonspecific abnormal results of pulmonary system function study   . Depressive disorder, not elsewhere classified   . Reflux esophagitis   . Spinal stenosis, unspecified region other than cervical   . Backache, unspecified   . Lumbosacral spondylosis without myelopathy   . Cervical spondylosis with myelopathy   . Hemorrhage of gastrointestinal tract, unspecified   . Pain in joint, shoulder region   . Other and unspecified disc disorder of cervical region   . Unspecified constipation   . Muscle weakness (generalized)   . Abdominal pain, generalized   . Carpal tunnel syndrome   . Unspecified glaucoma   . Dysuria   . Other and unspecified hyperlipidemia   . Unspecified essential hypertension   . Unspecified arthropathy, lower leg   . Other malaise and fatigue    Past Surgical History  Procedure Laterality Date  .  Spine surgery      neck fusion  . Joint replacement      both knees  . Rotator cuff left shoulder  2001    DR MURPHY   . Shoulder open rotator cuff repair  2006    DR [facility]  . Left knee replacement      DR ALUSIO  . Left transverse carpal ligament  01/06/2008  . Cerviacal spine (4x)      DR MARK ROY   . Bilateral cataract surgery  2009    DR GROAT   . Colonoscopy  2007    DR HENSEL    Family History  Problem Relation Age of Onset  . Cancer Mother 30    cervical cancer  . Cancer Father 48    lung  cancer   Social  History  Substance Use Topics  . Smoking status: Former Smoker    Quit date: 08/29/1985  . Smokeless tobacco: Former Systems developer    Quit date: 02/12/1985  . Alcohol Use: No    Review of Systems  Constitutional: Negative for diaphoresis.  Respiratory: Negative for chest tightness, shortness of breath and wheezing.   Cardiovascular: Negative for chest pain and leg swelling.  Gastrointestinal: Negative for nausea, vomiting and abdominal pain.  Musculoskeletal: Positive for neck pain. Negative for myalgias, back pain, joint swelling, gait problem and neck stiffness.  Neurological: Negative for dizziness, tremors, seizures, syncope, facial asymmetry, speech difficulty, weakness, numbness and headaches.  All other systems reviewed and are negative.  Allergies  Zanaflex  Home Medications   Prior to Admission medications   Medication Sig Start Date End Date Taking? Authorizing Provider  aspirin EC 81 MG tablet Take 81 mg by mouth daily.    Historical Provider, MD  baclofen (LIORESAL) 10 MG tablet Take 0.5-1 tablets (5-10 mg total) by mouth every 12 (twelve) hours as needed. Patient not taking: Reported on 08/30/2015 04/21/13   Meredith Staggers, MD  buPROPion [facility] SR) 150 MG 12 hr tablet Take 150 mg by mouth 2 (two) times daily.     Historical Provider, MD  cholecalciferol (VITAMIN D) 1000 UNITS tablet Take 1,000 Units by mouth daily.    Historical Provider, MD  diclofenac sodium (VOLTAREN) 1 % GEL Apply 1 application topically 3 (three) times daily. Applies to knees 02/13/12   Meredith Staggers, MD  DULoxetine (CYMBALTA) 60 MG capsule Take 60 mg by mouth daily.    Historical Provider, MD  furosemide (LASIX) 20 MG tablet Take 20 mg by mouth.    Historical Provider, MD  meloxicam (MOBIC) 7.5 MG tablet Take 7.5 mg by mouth daily as needed for pain.    Historical Provider, MD  methadone (DOLOPHINE) 10 MG tablet Take 10 mg by mouth every 6 (six) hours.    Historical Provider, MD  METOPROLOL TARTRATE  PO Take by mouth. Dose , freq unknown    Historical Provider, MD  nebivolol (BYSTOLIC) 5 MG tablet Take 10 mg by mouth daily.     Historical Provider, MD  omega-3 acid ethyl esters (LOVAZA) 1 G capsule Take 2 g by mouth daily.    Historical Provider, MD  omeprazole (PRILOSEC) 20 MG capsule TAKE 2 CAPSULES DAILY TO REDUCE STOMACH ACID AND HELP HEARTBURN 05/07/13   [name], MD  oxycodone (OXY-IR) 5 MG capsule Take 1 capsule (5 mg total) by mouth every 6 (six) hours as needed for pain. Patient not taking: Reported on 08/30/2015 05/20/13   Meredith Staggers, MD  OxyCODONE HCl  ER (OXYCONTIN) 30 MG T12A Take 60 mg by mouth 3 (three) times daily. Patient not taking: Reported on 08/30/2015 06/05/13   Meredith Staggers, MD  simvastatin (ZOCOR) 10 MG tablet Take 10 mg by mouth at bedtime.    Historical Provider, MD  sucralfate (CARAFATE) 1 G tablet TAKE 1 TABLET BY MOUTH BEFORE MEALS AND AT BEDTIME TO PROTECT STOMACH AND ESOPHAGUS 06/30/13   Lauree Chandler, NP  topiramate (TOPAMAX) 25 MG capsule Take 50 mg by mouth 2 (two) times daily.    Historical Provider, MD  vitamin E 400 UNIT capsule Take 400 Units by mouth daily.    Historical Provider, MD   BP 154/62 mmHg  Pulse 57  Resp 14  SpO2 96%   Physical Exam  Constitutional: He is oriented to person, place, and time. He appears well-developed and well-nourished. No distress.  HENT:  Head: Normocephalic and atraumatic.  Mouth/Throat: Mucous membranes are normal. No oropharyngeal exudate.  Eyes: Conjunctivae and EOM are normal. Pupils are equal, round, and reactive to light.  Pinpoint pupils  Neck: Normal range of motion. Neck supple. No tracheal deviation present.  Cardiovascular: Normal rate, regular rhythm and normal heart sounds.   Pulmonary/Chest: Effort normal and breath sounds normal. No respiratory distress. He has no wheezes. He has no rales.  Abdominal: Soft. Bowel sounds are normal. There is no tenderness. There is no rebound and no  guarding.  Musculoskeletal: Normal range of motion.  Neurological: He is alert and oriented to person, place, and time. He has normal reflexes.  DTRs are intact in BLEs  Skin: Skin is warm and dry.  Psychiatric: He has a normal mood and affect. His behavior is normal.  Nursing note and vitals reviewed.   ED Course  Procedures (including critical care time)  DIAGNOSTIC STUDIES: Oxygen Saturation is 96% on RA, normal by my interpretation.    COORDINATION OF CARE: 3:56 AM-Discussed treatment plan with pt at bedside and pt agreed to plan.   Labs Review Labs Reviewed - No data to display  Imaging Review No results found.   EKG Interpretation None      MDM   Final diagnoses:  None   Per DEA database is taking methadone 10 mg QID recently filled 120 tabs.  States there is no change in the pain and it is the same as in the previous 6-7 years.  When asked what is different he states nothing.  Stable for discharge at this time.  Follow up with your pain management specialist.    I personally performed the services described in this documentation, which was scribed in my presence. The recorded information has been reviewed and is accurate.       Veatrice Kells, MD 01/31/16 [phone]

## 2016-01-31 NOTE — Discharge Instructions (Signed)

## 2016-01-31 NOTE — ED Notes (Signed)
Bed: [facility] Expected date:  Expected time:  Means of arrival:  Comments: 75 yo M  Neck and shoulder pain

## 2016-01-31 NOTE — ED Notes (Addendum)
According to EMS, pt c/o of neck pain, arm pain, and difficulty ambulating. Pt denies recent injury/trauma or fall. Pt has a foul strong odor of urine radiating from his jeans.  Pt requires standby assist when transition from EMS gurney to ED gurney. Pt denies alcohol consumption. Pt arrives to ED A+OX4, speaking in complete sentences. Pt seen at the [place] on 2/6.

## 2016-01-31 NOTE — ED Notes (Signed)
Pt appears to be sleeping at present time.

## 2016-02-02 DIAGNOSIS — E784 Other hyperlipidemia: Secondary | ICD-10-CM | POA: Diagnosis not present

## 2016-02-02 DIAGNOSIS — Q211 Atrial septal defect: Secondary | ICD-10-CM | POA: Diagnosis not present

## 2016-02-02 DIAGNOSIS — G2581 Restless legs syndrome: Secondary | ICD-10-CM | POA: Diagnosis not present

## 2016-02-02 DIAGNOSIS — K219 Gastro-esophageal reflux disease without esophagitis: Secondary | ICD-10-CM | POA: Diagnosis not present

## 2016-02-02 DIAGNOSIS — M6281 Muscle weakness (generalized): Secondary | ICD-10-CM | POA: Diagnosis not present

## 2016-02-02 DIAGNOSIS — I638 Other cerebral infarction: Secondary | ICD-10-CM | POA: Diagnosis not present

## 2016-02-02 DIAGNOSIS — F419 Anxiety disorder, unspecified: Secondary | ICD-10-CM | POA: Diagnosis present

## 2016-02-02 DIAGNOSIS — E568 Deficiency of other vitamins: Secondary | ICD-10-CM | POA: Diagnosis not present

## 2016-02-02 DIAGNOSIS — F329 Major depressive disorder, single episode, unspecified: Secondary | ICD-10-CM | POA: Diagnosis present

## 2016-02-02 DIAGNOSIS — R51 Headache: Secondary | ICD-10-CM | POA: Diagnosis not present

## 2016-02-02 DIAGNOSIS — I10 Essential (primary) hypertension: Secondary | ICD-10-CM | POA: Diagnosis not present

## 2016-02-02 DIAGNOSIS — E785 Hyperlipidemia, unspecified: Secondary | ICD-10-CM | POA: Diagnosis not present

## 2016-02-02 DIAGNOSIS — R911 Solitary pulmonary nodule: Secondary | ICD-10-CM | POA: Diagnosis not present

## 2016-02-02 DIAGNOSIS — G894 Chronic pain syndrome: Secondary | ICD-10-CM | POA: Diagnosis present

## 2016-02-02 DIAGNOSIS — R001 Bradycardia, unspecified: Secondary | ICD-10-CM | POA: Diagnosis not present

## 2016-02-02 DIAGNOSIS — M961 Postlaminectomy syndrome, not elsewhere classified: Secondary | ICD-10-CM | POA: Diagnosis present

## 2016-02-02 DIAGNOSIS — I639 Cerebral infarction, unspecified: Secondary | ICD-10-CM | POA: Diagnosis not present

## 2016-02-02 DIAGNOSIS — I350 Nonrheumatic aortic (valve) stenosis: Secondary | ICD-10-CM | POA: Diagnosis present

## 2016-02-02 DIAGNOSIS — Z87891 Personal history of nicotine dependence: Secondary | ICD-10-CM | POA: Diagnosis not present

## 2016-02-02 DIAGNOSIS — M436 Torticollis: Secondary | ICD-10-CM | POA: Diagnosis present

## 2016-02-02 DIAGNOSIS — R451 Restlessness and agitation: Secondary | ICD-10-CM | POA: Diagnosis not present

## 2016-02-02 DIAGNOSIS — R609 Edema, unspecified: Secondary | ICD-10-CM | POA: Diagnosis not present

## 2016-02-02 DIAGNOSIS — R278 Other lack of coordination: Secondary | ICD-10-CM | POA: Diagnosis not present

## 2016-02-02 DIAGNOSIS — R2689 Other abnormalities of gait and mobility: Secondary | ICD-10-CM | POA: Diagnosis not present

## 2016-02-02 DIAGNOSIS — M4802 Spinal stenosis, cervical region: Secondary | ICD-10-CM | POA: Diagnosis not present

## 2016-02-02 DIAGNOSIS — G464 Cerebellar stroke syndrome: Secondary | ICD-10-CM | POA: Diagnosis not present

## 2016-02-02 DIAGNOSIS — E876 Hypokalemia: Secondary | ICD-10-CM | POA: Diagnosis present

## 2016-02-02 DIAGNOSIS — G8929 Other chronic pain: Secondary | ICD-10-CM | POA: Diagnosis not present

## 2016-02-02 DIAGNOSIS — G4089 Other seizures: Secondary | ICD-10-CM | POA: Diagnosis not present

## 2016-02-02 DIAGNOSIS — K5909 Other constipation: Secondary | ICD-10-CM | POA: Diagnosis not present

## 2016-02-02 DIAGNOSIS — M4310 Spondylolisthesis, site unspecified: Secondary | ICD-10-CM | POA: Diagnosis present

## 2016-02-02 DIAGNOSIS — I6930 Unspecified sequelae of cerebral infarction: Secondary | ICD-10-CM | POA: Diagnosis not present

## 2016-02-02 DIAGNOSIS — R252 Cramp and spasm: Secondary | ICD-10-CM | POA: Diagnosis not present

## 2016-02-02 DIAGNOSIS — R079 Chest pain, unspecified: Secondary | ICD-10-CM | POA: Diagnosis not present

## 2016-02-15 DIAGNOSIS — E7800 Pure hypercholesterolemia, unspecified: Secondary | ICD-10-CM | POA: Diagnosis not present

## 2016-02-15 DIAGNOSIS — R2689 Other abnormalities of gait and mobility: Secondary | ICD-10-CM | POA: Diagnosis not present

## 2016-02-15 DIAGNOSIS — G8929 Other chronic pain: Secondary | ICD-10-CM | POA: Diagnosis not present

## 2016-02-15 DIAGNOSIS — F329 Major depressive disorder, single episode, unspecified: Secondary | ICD-10-CM | POA: Diagnosis not present

## 2016-02-15 DIAGNOSIS — R609 Edema, unspecified: Secondary | ICD-10-CM | POA: Diagnosis not present

## 2016-02-15 DIAGNOSIS — I10 Essential (primary) hypertension: Secondary | ICD-10-CM | POA: Diagnosis not present

## 2016-02-15 DIAGNOSIS — E784 Other hyperlipidemia: Secondary | ICD-10-CM | POA: Diagnosis not present

## 2016-02-15 DIAGNOSIS — G464 Cerebellar stroke syndrome: Secondary | ICD-10-CM | POA: Diagnosis not present

## 2016-02-15 DIAGNOSIS — K5909 Other constipation: Secondary | ICD-10-CM | POA: Diagnosis not present

## 2016-02-15 DIAGNOSIS — M542 Cervicalgia: Secondary | ICD-10-CM | POA: Diagnosis not present

## 2016-02-15 DIAGNOSIS — I638 Other cerebral infarction: Secondary | ICD-10-CM | POA: Diagnosis not present

## 2016-02-15 DIAGNOSIS — G2581 Restless legs syndrome: Secondary | ICD-10-CM | POA: Diagnosis not present

## 2016-02-15 DIAGNOSIS — G4089 Other seizures: Secondary | ICD-10-CM | POA: Diagnosis not present

## 2016-02-15 DIAGNOSIS — M6281 Muscle weakness (generalized): Secondary | ICD-10-CM | POA: Diagnosis not present

## 2016-02-15 DIAGNOSIS — K219 Gastro-esophageal reflux disease without esophagitis: Secondary | ICD-10-CM | POA: Diagnosis not present

## 2016-02-15 DIAGNOSIS — R278 Other lack of coordination: Secondary | ICD-10-CM | POA: Diagnosis not present

## 2016-02-15 DIAGNOSIS — I639 Cerebral infarction, unspecified: Secondary | ICD-10-CM | POA: Diagnosis not present

## 2016-02-15 DIAGNOSIS — M4802 Spinal stenosis, cervical region: Secondary | ICD-10-CM | POA: Diagnosis not present

## 2016-02-15 DIAGNOSIS — R001 Bradycardia, unspecified: Secondary | ICD-10-CM | POA: Diagnosis not present

## 2016-02-15 DIAGNOSIS — R911 Solitary pulmonary nodule: Secondary | ICD-10-CM | POA: Diagnosis not present

## 2016-02-15 DIAGNOSIS — I6930 Unspecified sequelae of cerebral infarction: Secondary | ICD-10-CM | POA: Diagnosis not present

## 2016-02-15 DIAGNOSIS — E568 Deficiency of other vitamins: Secondary | ICD-10-CM | POA: Diagnosis not present

## 2016-02-16 DIAGNOSIS — I10 Essential (primary) hypertension: Secondary | ICD-10-CM | POA: Diagnosis not present

## 2016-02-16 DIAGNOSIS — I639 Cerebral infarction, unspecified: Secondary | ICD-10-CM | POA: Diagnosis not present

## 2016-02-16 DIAGNOSIS — R911 Solitary pulmonary nodule: Secondary | ICD-10-CM | POA: Diagnosis not present

## 2016-02-16 DIAGNOSIS — E7800 Pure hypercholesterolemia, unspecified: Secondary | ICD-10-CM | POA: Diagnosis not present

## 2016-02-16 DIAGNOSIS — M542 Cervicalgia: Secondary | ICD-10-CM | POA: Diagnosis not present

## 2016-03-06 ENCOUNTER — Institutional Professional Consult (permissible substitution): Payer: PRIVATE HEALTH INSURANCE | Admitting: Diagnostic Neuroimaging

## 2016-05-10 DIAGNOSIS — Z7982 Long term (current) use of aspirin: Secondary | ICD-10-CM | POA: Diagnosis not present

## 2016-05-10 DIAGNOSIS — I10 Essential (primary) hypertension: Secondary | ICD-10-CM | POA: Diagnosis not present

## 2016-05-10 DIAGNOSIS — Z87891 Personal history of nicotine dependence: Secondary | ICD-10-CM | POA: Diagnosis not present

## 2016-05-10 DIAGNOSIS — E785 Hyperlipidemia, unspecified: Secondary | ICD-10-CM | POA: Diagnosis not present

## 2016-05-10 DIAGNOSIS — R6884 Jaw pain: Secondary | ICD-10-CM | POA: Diagnosis not present

## 2016-07-02 DIAGNOSIS — R531 Weakness: Secondary | ICD-10-CM | POA: Diagnosis not present

## 2016-07-02 DIAGNOSIS — I10 Essential (primary) hypertension: Secondary | ICD-10-CM | POA: Diagnosis not present

## 2016-07-02 DIAGNOSIS — R51 Headache: Secondary | ICD-10-CM | POA: Diagnosis not present

## 2016-07-02 DIAGNOSIS — Z7982 Long term (current) use of aspirin: Secondary | ICD-10-CM | POA: Diagnosis not present

## 2016-07-02 DIAGNOSIS — M542 Cervicalgia: Secondary | ICD-10-CM | POA: Diagnosis not present

## 2016-07-02 DIAGNOSIS — G8929 Other chronic pain: Secondary | ICD-10-CM | POA: Diagnosis not present

## 2016-07-02 DIAGNOSIS — Z8739 Personal history of other diseases of the musculoskeletal system and connective tissue: Secondary | ICD-10-CM | POA: Diagnosis not present

## 2016-07-02 DIAGNOSIS — E785 Hyperlipidemia, unspecified: Secondary | ICD-10-CM | POA: Diagnosis not present

## 2016-07-02 DIAGNOSIS — Z87891 Personal history of nicotine dependence: Secondary | ICD-10-CM | POA: Diagnosis not present

## 2016-09-12 DIAGNOSIS — M12812 Other specific arthropathies, not elsewhere classified, left shoulder: Secondary | ICD-10-CM | POA: Diagnosis not present

## 2016-09-12 DIAGNOSIS — M12811 Other specific arthropathies, not elsewhere classified, right shoulder: Secondary | ICD-10-CM | POA: Diagnosis not present

## 2016-09-14 DIAGNOSIS — Z96653 Presence of artificial knee joint, bilateral: Secondary | ICD-10-CM | POA: Diagnosis not present

## 2016-09-14 DIAGNOSIS — Z96652 Presence of left artificial knee joint: Secondary | ICD-10-CM | POA: Diagnosis not present

## 2016-09-14 DIAGNOSIS — Z471 Aftercare following joint replacement surgery: Secondary | ICD-10-CM | POA: Diagnosis not present

## 2016-09-14 DIAGNOSIS — Z96651 Presence of right artificial knee joint: Secondary | ICD-10-CM | POA: Diagnosis not present

## 2016-09-19 DIAGNOSIS — Z87891 Personal history of nicotine dependence: Secondary | ICD-10-CM | POA: Diagnosis not present

## 2016-09-19 DIAGNOSIS — Z79891 Long term (current) use of opiate analgesic: Secondary | ICD-10-CM | POA: Diagnosis not present

## 2016-09-19 DIAGNOSIS — M5033 Other cervical disc degeneration, cervicothoracic region: Secondary | ICD-10-CM | POA: Diagnosis not present

## 2016-09-19 DIAGNOSIS — M546 Pain in thoracic spine: Secondary | ICD-10-CM | POA: Diagnosis not present

## 2016-09-19 DIAGNOSIS — M542 Cervicalgia: Secondary | ICD-10-CM | POA: Diagnosis not present

## 2016-09-19 DIAGNOSIS — M5442 Lumbago with sciatica, left side: Secondary | ICD-10-CM | POA: Diagnosis not present

## 2016-09-19 DIAGNOSIS — E785 Hyperlipidemia, unspecified: Secondary | ICD-10-CM | POA: Diagnosis not present

## 2016-09-19 DIAGNOSIS — I10 Essential (primary) hypertension: Secondary | ICD-10-CM | POA: Diagnosis not present

## 2016-09-19 DIAGNOSIS — M5441 Lumbago with sciatica, right side: Secondary | ICD-10-CM | POA: Diagnosis not present

## 2016-09-19 DIAGNOSIS — M5135 Other intervertebral disc degeneration, thoracolumbar region: Secondary | ICD-10-CM | POA: Diagnosis not present

## 2016-09-19 DIAGNOSIS — Z9889 Other specified postprocedural states: Secondary | ICD-10-CM | POA: Diagnosis not present

## 2016-09-19 DIAGNOSIS — Z7982 Long term (current) use of aspirin: Secondary | ICD-10-CM | POA: Diagnosis not present

## 2016-09-19 DIAGNOSIS — M545 Low back pain: Secondary | ICD-10-CM | POA: Diagnosis not present

## 2016-09-19 DIAGNOSIS — Z8673 Personal history of transient ischemic attack (TIA), and cerebral infarction without residual deficits: Secondary | ICD-10-CM | POA: Diagnosis not present

## 2016-09-19 DIAGNOSIS — M1288 Other specific arthropathies, not elsewhere classified, other specified site: Secondary | ICD-10-CM | POA: Diagnosis not present

## 2016-10-18 DIAGNOSIS — H01021 Squamous blepharitis right upper eyelid: Secondary | ICD-10-CM | POA: Diagnosis not present

## 2016-10-18 DIAGNOSIS — H10413 Chronic giant papillary conjunctivitis, bilateral: Secondary | ICD-10-CM | POA: Diagnosis not present

## 2016-10-18 DIAGNOSIS — H01022 Squamous blepharitis right lower eyelid: Secondary | ICD-10-CM | POA: Diagnosis not present

## 2016-10-18 DIAGNOSIS — Z961 Presence of intraocular lens: Secondary | ICD-10-CM | POA: Diagnosis not present

## 2016-10-18 DIAGNOSIS — H40013 Open angle with borderline findings, low risk, bilateral: Secondary | ICD-10-CM | POA: Diagnosis not present

## 2016-10-18 DIAGNOSIS — H01025 Squamous blepharitis left lower eyelid: Secondary | ICD-10-CM | POA: Diagnosis not present

## 2016-10-18 DIAGNOSIS — H04123 Dry eye syndrome of bilateral lacrimal glands: Secondary | ICD-10-CM | POA: Diagnosis not present

## 2016-10-18 DIAGNOSIS — H01024 Squamous blepharitis left upper eyelid: Secondary | ICD-10-CM | POA: Diagnosis not present

## 2016-11-20 DIAGNOSIS — M9901 Segmental and somatic dysfunction of cervical region: Secondary | ICD-10-CM | POA: Diagnosis not present

## 2016-11-20 DIAGNOSIS — M9902 Segmental and somatic dysfunction of thoracic region: Secondary | ICD-10-CM | POA: Diagnosis not present

## 2016-11-20 DIAGNOSIS — M791 Myalgia: Secondary | ICD-10-CM | POA: Diagnosis not present

## 2016-11-20 DIAGNOSIS — M542 Cervicalgia: Secondary | ICD-10-CM | POA: Diagnosis not present

## 2016-11-22 DIAGNOSIS — M542 Cervicalgia: Secondary | ICD-10-CM | POA: Diagnosis not present

## 2016-11-22 DIAGNOSIS — M9902 Segmental and somatic dysfunction of thoracic region: Secondary | ICD-10-CM | POA: Diagnosis not present

## 2016-11-22 DIAGNOSIS — M9901 Segmental and somatic dysfunction of cervical region: Secondary | ICD-10-CM | POA: Diagnosis not present

## 2016-11-22 DIAGNOSIS — M791 Myalgia: Secondary | ICD-10-CM | POA: Diagnosis not present

## 2016-11-26 DIAGNOSIS — M9902 Segmental and somatic dysfunction of thoracic region: Secondary | ICD-10-CM | POA: Diagnosis not present

## 2016-11-26 DIAGNOSIS — M542 Cervicalgia: Secondary | ICD-10-CM | POA: Diagnosis not present

## 2016-11-26 DIAGNOSIS — M9901 Segmental and somatic dysfunction of cervical region: Secondary | ICD-10-CM | POA: Diagnosis not present

## 2016-11-26 DIAGNOSIS — M791 Myalgia: Secondary | ICD-10-CM | POA: Diagnosis not present

## 2016-11-27 DIAGNOSIS — M9901 Segmental and somatic dysfunction of cervical region: Secondary | ICD-10-CM | POA: Diagnosis not present

## 2016-11-27 DIAGNOSIS — M9902 Segmental and somatic dysfunction of thoracic region: Secondary | ICD-10-CM | POA: Diagnosis not present

## 2016-11-27 DIAGNOSIS — M791 Myalgia: Secondary | ICD-10-CM | POA: Diagnosis not present

## 2016-11-27 DIAGNOSIS — M542 Cervicalgia: Secondary | ICD-10-CM | POA: Diagnosis not present

## 2016-11-29 DIAGNOSIS — M542 Cervicalgia: Secondary | ICD-10-CM | POA: Diagnosis not present

## 2016-11-29 DIAGNOSIS — M791 Myalgia: Secondary | ICD-10-CM | POA: Diagnosis not present

## 2016-11-29 DIAGNOSIS — M9901 Segmental and somatic dysfunction of cervical region: Secondary | ICD-10-CM | POA: Diagnosis not present

## 2016-11-29 DIAGNOSIS — M9902 Segmental and somatic dysfunction of thoracic region: Secondary | ICD-10-CM | POA: Diagnosis not present

## 2016-12-03 DIAGNOSIS — M791 Myalgia: Secondary | ICD-10-CM | POA: Diagnosis not present

## 2016-12-03 DIAGNOSIS — M9901 Segmental and somatic dysfunction of cervical region: Secondary | ICD-10-CM | POA: Diagnosis not present

## 2016-12-03 DIAGNOSIS — M9902 Segmental and somatic dysfunction of thoracic region: Secondary | ICD-10-CM | POA: Diagnosis not present

## 2016-12-03 DIAGNOSIS — M542 Cervicalgia: Secondary | ICD-10-CM | POA: Diagnosis not present

## 2016-12-04 DIAGNOSIS — M542 Cervicalgia: Secondary | ICD-10-CM | POA: Diagnosis not present

## 2016-12-04 DIAGNOSIS — M9902 Segmental and somatic dysfunction of thoracic region: Secondary | ICD-10-CM | POA: Diagnosis not present

## 2016-12-04 DIAGNOSIS — M9901 Segmental and somatic dysfunction of cervical region: Secondary | ICD-10-CM | POA: Diagnosis not present

## 2016-12-04 DIAGNOSIS — M791 Myalgia: Secondary | ICD-10-CM | POA: Diagnosis not present

## 2017-04-17 DIAGNOSIS — M12811 Other specific arthropathies, not elsewhere classified, right shoulder: Secondary | ICD-10-CM | POA: Diagnosis not present

## 2017-04-17 DIAGNOSIS — M12812 Other specific arthropathies, not elsewhere classified, left shoulder: Secondary | ICD-10-CM | POA: Diagnosis not present

## 2017-08-08 DIAGNOSIS — N529 Male erectile dysfunction, unspecified: Secondary | ICD-10-CM | POA: Diagnosis not present

## 2017-08-08 DIAGNOSIS — M1990 Unspecified osteoarthritis, unspecified site: Secondary | ICD-10-CM | POA: Diagnosis not present

## 2017-08-08 DIAGNOSIS — L219 Seborrheic dermatitis, unspecified: Secondary | ICD-10-CM | POA: Diagnosis not present

## 2017-08-08 DIAGNOSIS — L711 Rhinophyma: Secondary | ICD-10-CM | POA: Diagnosis not present

## 2017-08-12 DIAGNOSIS — N401 Enlarged prostate with lower urinary tract symptoms: Secondary | ICD-10-CM | POA: Diagnosis not present

## 2017-08-12 DIAGNOSIS — E785 Hyperlipidemia, unspecified: Secondary | ICD-10-CM | POA: Diagnosis not present

## 2017-08-12 DIAGNOSIS — Z0001 Encounter for general adult medical examination with abnormal findings: Secondary | ICD-10-CM | POA: Diagnosis not present

## 2017-08-12 DIAGNOSIS — I208 Other forms of angina pectoris: Secondary | ICD-10-CM | POA: Diagnosis not present

## 2017-08-12 DIAGNOSIS — H8113 Benign paroxysmal vertigo, bilateral: Secondary | ICD-10-CM | POA: Diagnosis not present

## 2017-08-12 DIAGNOSIS — Z9849 Cataract extraction status, unspecified eye: Secondary | ICD-10-CM | POA: Diagnosis not present

## 2017-08-12 DIAGNOSIS — I10 Essential (primary) hypertension: Secondary | ICD-10-CM | POA: Diagnosis not present

## 2017-08-12 DIAGNOSIS — M5011 Cervical disc disorder with radiculopathy,  high cervical region: Secondary | ICD-10-CM | POA: Diagnosis not present

## 2017-08-12 DIAGNOSIS — N139 Obstructive and reflux uropathy, unspecified: Secondary | ICD-10-CM | POA: Diagnosis not present

## 2017-08-12 DIAGNOSIS — Z125 Encounter for screening for malignant neoplasm of prostate: Secondary | ICD-10-CM | POA: Diagnosis not present

## 2017-08-20 DIAGNOSIS — M1990 Unspecified osteoarthritis, unspecified site: Secondary | ICD-10-CM | POA: Diagnosis not present

## 2017-08-20 DIAGNOSIS — M5011 Cervical disc disorder with radiculopathy,  high cervical region: Secondary | ICD-10-CM | POA: Diagnosis not present

## 2017-08-20 DIAGNOSIS — G894 Chronic pain syndrome: Secondary | ICD-10-CM | POA: Diagnosis not present

## 2017-08-20 DIAGNOSIS — J309 Allergic rhinitis, unspecified: Secondary | ICD-10-CM | POA: Diagnosis not present

## 2017-09-03 DIAGNOSIS — Z1211 Encounter for screening for malignant neoplasm of colon: Secondary | ICD-10-CM | POA: Diagnosis not present

## 2017-09-03 DIAGNOSIS — Z1212 Encounter for screening for malignant neoplasm of rectum: Secondary | ICD-10-CM | POA: Diagnosis not present

## 2017-09-04 DIAGNOSIS — F39 Unspecified mood [affective] disorder: Secondary | ICD-10-CM | POA: Diagnosis not present

## 2017-09-04 DIAGNOSIS — G894 Chronic pain syndrome: Secondary | ICD-10-CM | POA: Diagnosis not present

## 2017-09-04 DIAGNOSIS — M1990 Unspecified osteoarthritis, unspecified site: Secondary | ICD-10-CM | POA: Diagnosis not present

## 2017-09-11 DIAGNOSIS — G894 Chronic pain syndrome: Secondary | ICD-10-CM | POA: Diagnosis not present

## 2017-09-11 DIAGNOSIS — F39 Unspecified mood [affective] disorder: Secondary | ICD-10-CM | POA: Diagnosis not present

## 2017-09-11 DIAGNOSIS — Z1211 Encounter for screening for malignant neoplasm of colon: Secondary | ICD-10-CM | POA: Diagnosis not present

## 2017-09-16 DIAGNOSIS — R195 Other fecal abnormalities: Secondary | ICD-10-CM | POA: Diagnosis not present

## 2017-09-19 DIAGNOSIS — M542 Cervicalgia: Secondary | ICD-10-CM | POA: Diagnosis not present

## 2017-09-19 DIAGNOSIS — G894 Chronic pain syndrome: Secondary | ICD-10-CM | POA: Diagnosis not present

## 2017-09-27 DIAGNOSIS — M542 Cervicalgia: Secondary | ICD-10-CM | POA: Diagnosis not present

## 2017-09-27 DIAGNOSIS — G894 Chronic pain syndrome: Secondary | ICD-10-CM | POA: Diagnosis not present

## 2017-10-03 DIAGNOSIS — H8113 Benign paroxysmal vertigo, bilateral: Secondary | ICD-10-CM | POA: Diagnosis not present

## 2017-10-03 DIAGNOSIS — F39 Unspecified mood [affective] disorder: Secondary | ICD-10-CM | POA: Diagnosis not present

## 2017-10-16 ENCOUNTER — Ambulatory Visit
Admission: RE | Admit: 2017-10-16 | Discharge: 2017-10-16 | Disposition: A | Discharge status: Home / Self Care | Payer: Medicare Other | Source: Ambulatory Visit | Attending: Family Medicine | Admitting: Family Medicine

## 2017-10-16 ENCOUNTER — Other Ambulatory Visit: Payer: Self-pay | Admitting: Family Medicine

## 2017-10-16 DIAGNOSIS — R52 Pain, unspecified: Secondary | ICD-10-CM

## 2017-10-16 DIAGNOSIS — F39 Unspecified mood [affective] disorder: Secondary | ICD-10-CM | POA: Diagnosis not present

## 2017-10-16 DIAGNOSIS — G894 Chronic pain syndrome: Secondary | ICD-10-CM | POA: Diagnosis not present

## 2017-10-16 DIAGNOSIS — M542 Cervicalgia: Secondary | ICD-10-CM | POA: Diagnosis not present

## 2017-10-16 DIAGNOSIS — M5011 Cervical disc disorder with radiculopathy,  high cervical region: Secondary | ICD-10-CM | POA: Diagnosis not present

## 2017-10-17 ENCOUNTER — Other Ambulatory Visit: Payer: Self-pay | Admitting: Gastroenterology

## 2017-10-18 DIAGNOSIS — G894 Chronic pain syndrome: Secondary | ICD-10-CM | POA: Diagnosis not present

## 2017-10-18 DIAGNOSIS — L989 Disorder of the skin and subcutaneous tissue, unspecified: Secondary | ICD-10-CM | POA: Diagnosis not present

## 2017-10-18 DIAGNOSIS — C44319 Basal cell carcinoma of skin of other parts of face: Secondary | ICD-10-CM | POA: Diagnosis not present

## 2017-10-18 DIAGNOSIS — M542 Cervicalgia: Secondary | ICD-10-CM | POA: Diagnosis not present

## 2017-11-01 DIAGNOSIS — F39 Unspecified mood [affective] disorder: Secondary | ICD-10-CM | POA: Diagnosis not present

## 2017-11-01 DIAGNOSIS — C44319 Basal cell carcinoma of skin of other parts of face: Secondary | ICD-10-CM | POA: Diagnosis not present

## 2017-11-01 DIAGNOSIS — G894 Chronic pain syndrome: Secondary | ICD-10-CM | POA: Diagnosis not present

## 2017-11-12 DIAGNOSIS — C44319 Basal cell carcinoma of skin of other parts of face: Secondary | ICD-10-CM | POA: Diagnosis not present

## 2017-11-14 ENCOUNTER — Other Ambulatory Visit: Payer: Self-pay | Admitting: Gastroenterology

## 2017-11-15 DIAGNOSIS — R300 Dysuria: Secondary | ICD-10-CM | POA: Diagnosis not present

## 2017-11-15 DIAGNOSIS — N390 Urinary tract infection, site not specified: Secondary | ICD-10-CM | POA: Diagnosis not present

## 2017-11-15 DIAGNOSIS — M2550 Pain in unspecified joint: Secondary | ICD-10-CM | POA: Diagnosis not present

## 2017-11-15 DIAGNOSIS — C44319 Basal cell carcinoma of skin of other parts of face: Secondary | ICD-10-CM | POA: Diagnosis not present

## 2017-11-19 ENCOUNTER — Other Ambulatory Visit: Payer: Self-pay

## 2017-11-19 ENCOUNTER — Ambulatory Visit: Payer: PRIVATE HEALTH INSURANCE | Admitting: Family Medicine

## 2017-11-19 ENCOUNTER — Encounter (HOSPITAL_COMMUNITY): Payer: Self-pay | Admitting: *Deleted

## 2017-11-19 NOTE — Progress Notes (Addendum)
Anesthesia Chart Review: SAME DAY WORK-UP (ENDO).  Patient is a 76 year old male scheduled for colonoscopy on 11/20/17 by Dr. Wilford Corner. Patient reports history of blood in his stool.  History includes DIFFICULT AIRWAY (anterior/posterior cervical fusion; reported "needs smaller tube"), former smoker (quit '86), HTN, HLD, CVA 01/2016 (transferred from [place] to [facility] with; right cerebellar vermis infarct; no tPA due to outside time window; CTA showed possible stenosis or occlusion of distal right PICA; TEE showed PFO), chronic pain syndrome, depression, iron deficiency anemia, syncope (entered in 2014), chronic sinusitis/pharyngitis, reflux esophagitis, muscle weakness (generalized), glaucoma, bilateral TKA (right '07, left '08), C1 fracture with right occipital neuralgia (s/p multiple surgeries; right C1 decompression 04/18/04, occiput-C4 posterior fusion 05/18/05; C6-7 ACDF 01/28/07; C6-7 posterior fusion 08/22/07; C4-T1 posterior fusion and C6-7 laminectomy 03/30/08). Review of records in [place] show a PFO, mild-moderate AS with no AR by TTE but restricted AV opening with moderate AR by TEE, and a LLL lung nodule on chest CTA (unclear if this has been re-evaluated at the [facility] since I cannot access records without a release).   He gets some care through the [facility], but has an appointment to get established with Dr. Elsie Stain on 11/25/17. He denied seeing a cardiologist.   He reported an EKG from April 2018 at the [facility], but we cannot obtain copy without a signed release.  TEE 02/14/16 [facility] Care Everywhere): SUMMARY Global LV systolic function is preserved The right ventricle is normal in size and function. The left atrial appendage is normal. No thrombus is detected in the left atrial appendage. A patent foramen ovale is present. Injection of agitated saline showed right-to-left interatrial shunt. Diffuse thickening of the aortic valve with restricted cusp opening. [No  gradients seen on report] There is moderate aortic regurgitation. There is no pericardial effusion. There is no comparison study available.  TTE 02/03/16 ([place]): SUMMARY The left ventricular size is normal. Left ventricular systolic function is normal. LV ejection fraction = 60-65%. Left ventricular filling pattern is impaired. The left ventricular wall motion is normal. The right ventricle is normal in size and function. Diffuse calcification of the aortic valve. Aortic valve peak pressure gradient is 39 mmHg. Aortic valve mean pressure gradient is 23 mmHg. There is mild to moderate aortic stenosis. There is no aortic regurgitation. SV(LVOT): 93.6 ml Ao max PG: 39.3 mmHg Ao mean PG: 22.9 mmHg Ao V2 VTI: 67.9 cm AVA (VTI): 1.4 cm2 LV V1 VTI: 30.8 cm AS Dimensionless Index (VTI): 0.45 AVAi(VTI) cm^2/m^2: 0.66 cm2 SV index(LVOT): 44.8 ml/m2 There is trace mitral regurgitation. There is no pericardial effusion. Injection of agitated saline showed no right-to-left shunt. There is no comparison study available.  According to UP TO DATE:  Aortic Regurgitation Serial Evaluation: For mild AR, echocardiograms every three to fiver years For moderate AR, echocardiograms every one to two years For severe AR, echocardiograms every 6-12 months; more frequently if there appears to be progressive LV dilatation.  Aortic Stenosis Serial Evaluation: "We agree with the 2014 [place] of [employer] valvular guideline recommendations for serial echocardiography in patients with aortic stenosis (AS). In patients with Stage B mild AS (transvalvular velocity 2-2.9 m/s), echocardiography is recommended every three to five years. In patients with Stage B moderate AS (transvalvular velocity 3-3.9 m/s), echocardiography is recommended every one to two years. In patients with asymptomatic Stage C1 severe AS (transvalvular velocity 4 m/s or higher),  echocardiography is recommended every 6 to 12 months."  Nuclear stress test 12/06/04: IMPRESSION: 1. No evidence of myocardial ischemia or infarction. 2. Normal wall motion. 3. QGS ejection fraction 61%.  CTA head/neck 02/03/16 ([place]): 1.Right inferomedial cerebellar infarct, likely subacute, and partially obscured by streak artifact from adjacent fixation hardware. No evidence of acute hemorrhage. There is mild local mass effect with some effacement of the fourth ventricle but no hydrocephalus. 2.Poor opacification of the distal right PICA, potentially reflecting stenosis and/or distal occlusion. Evaluation limited by streak artifact. 3.Atherosclerotic calcification of the right vertebral artery origin with less than 50% stenosis.  4.Atherosclerotic calcification of the bilateral carotid bifurcations without significant stenosis.  CT C-spine 09/19/16 [facility] Care Everywhere): Result Impression: No evidence of acute fracture or traumatic malalignment of the cervical spine. There is no change in the cervical alignment with a fusion spanning occiput through T1. No advanced canal stenosis is evident. The exam is limited by the artifact from the metal.  CTA Chest 02/12/16 ([place]): 1.No pulmonary emboli identified. 2.Incomplete contrast opacification of the left atrial appendage may simply relate to incomplete mixture of the contrast within the blood pool. However, a thrombus is also possible. Consider correlation with echocardiography. 3.Aortic valvular calcifications. Consider correlation with echocardiography. 4.There is a 5 mm nodule in the superior segment of the left lower lobe in a patient with a prior history of smoking. Recommend correlation with any prior outside chest CT studies and/or follow-up with CT chest study in 12 months. Findings and recommendations regarding the pulmonary nodule in addition to the first three conclusions were  discussed with Dr. Loletha Grayer by Dr. [place] Locks on 02/13/2016 10:37 AM.  He denied CXR within the past year. Last EKG in [place] is from 04/25/10 and showed SB.   Patient is a SDW. Per PAT RN phone call, patient denied SOB and chest pain. He denied being under the care of a cardiologist. Discussed above with anesthesiologist Dr. Suella Broad. If patient is not symptomatic of his aortic valve disease and otherwise labs and exam findings stable then it is anticipated that he can proceed with colonoscopy as scheduled. He will need an updated EKG prior to his procedure. Labs per GI. 2009 anesthesia record requested from [facility] HIM, but is still pending. (Of note, patient had aortic valve disease, PFO, and lung nodule found during 01/2016 hospitalization at [facility]. Patient denied seeing a cardiologist and did not recall recent chest imaging. Since I don't have access to [facility] records--and cannot get without a signed release--I have no way of knowing what has been followed up on or if [facility] findings were forwarded to the [facility]. He is scheduled to get established with Dr. Damita Dunnings on 11/25/17. I will send him a staff message in hopes he can follow-up with patient and help arrange follow-up either here or at the [facility]Myra Gianotti, PA-C [facility] Short Stay Center/Anesthesiology Phone [phone] 11/19/2017 3:42 PM  Addendum: Anesthesia record from 03/30/08 received. Oral intubation using Stylet and awake fiberoptic scope (transtracheal block) used to place an 8.0 ET tube.  George Hugh [facility] Short Stay Center/Anesthesiology Phone [phone] 11/20/2017 4:41 PM

## 2017-11-19 NOTE — Progress Notes (Signed)
Pt denies SOB, chest pain, and being under the care of a cardiologist. Pt denies having a cardiac cath. Pt stated that he was instructed to stop taking Aspirin " last dose was 3 days ago." Pt made aware to stop taking vitamins, fish oil and herbal medication (Turmeric, Collagen, Plant caps, Nitro Beets). Do not take any NSAIDs ie: Ibuprofen, Advil, Naproxen (Aleve), Motrin, BC and Goody Powder or any medication containing Aspirin. Pt stated that an EKG was performed in April at the [facility] in [place]; request DOS. Pt denies having a chest x ray within the last year. Pt verbalized understanding of all pre-op instructions. Anesthesia asked to review pt echo in C/E.

## 2017-11-20 ENCOUNTER — Encounter (HOSPITAL_COMMUNITY)
Admission: RE | Disposition: A | Discharge status: Home / Self Care | Payer: Self-pay | Source: Ambulatory Visit | Attending: Gastroenterology

## 2017-11-20 ENCOUNTER — Encounter (HOSPITAL_COMMUNITY): Payer: Self-pay | Admitting: *Deleted

## 2017-11-20 ENCOUNTER — Ambulatory Visit (HOSPITAL_COMMUNITY): Payer: Medicare Other | Admitting: Vascular Surgery

## 2017-11-20 ENCOUNTER — Ambulatory Visit (HOSPITAL_COMMUNITY)
Admission: RE | Admit: 2017-11-20 | Discharge: 2017-11-20 | Disposition: A | Discharge status: Home / Self Care | Payer: Medicare Other | Source: Ambulatory Visit | Attending: Gastroenterology | Admitting: Gastroenterology

## 2017-11-20 DIAGNOSIS — I340 Nonrheumatic mitral (valve) insufficiency: Secondary | ICD-10-CM | POA: Insufficient documentation

## 2017-11-20 DIAGNOSIS — E785 Hyperlipidemia, unspecified: Secondary | ICD-10-CM | POA: Diagnosis not present

## 2017-11-20 DIAGNOSIS — Z7982 Long term (current) use of aspirin: Secondary | ICD-10-CM | POA: Insufficient documentation

## 2017-11-20 DIAGNOSIS — Z79899 Other long term (current) drug therapy: Secondary | ICD-10-CM | POA: Diagnosis not present

## 2017-11-20 DIAGNOSIS — K210 Gastro-esophageal reflux disease with esophagitis: Secondary | ICD-10-CM | POA: Diagnosis not present

## 2017-11-20 DIAGNOSIS — Z7951 Long term (current) use of inhaled steroids: Secondary | ICD-10-CM | POA: Diagnosis not present

## 2017-11-20 DIAGNOSIS — K640 First degree hemorrhoids: Secondary | ICD-10-CM | POA: Diagnosis not present

## 2017-11-20 DIAGNOSIS — Z87891 Personal history of nicotine dependence: Secondary | ICD-10-CM | POA: Insufficient documentation

## 2017-11-20 DIAGNOSIS — I10 Essential (primary) hypertension: Secondary | ICD-10-CM | POA: Diagnosis not present

## 2017-11-20 DIAGNOSIS — E7800 Pure hypercholesterolemia, unspecified: Secondary | ICD-10-CM | POA: Diagnosis not present

## 2017-11-20 DIAGNOSIS — Q439 Congenital malformation of intestine, unspecified: Secondary | ICD-10-CM | POA: Diagnosis not present

## 2017-11-20 DIAGNOSIS — Z8673 Personal history of transient ischemic attack (TIA), and cerebral infarction without residual deficits: Secondary | ICD-10-CM | POA: Diagnosis not present

## 2017-11-20 DIAGNOSIS — K621 Rectal polyp: Secondary | ICD-10-CM | POA: Diagnosis not present

## 2017-11-20 DIAGNOSIS — Z96653 Presence of artificial knee joint, bilateral: Secondary | ICD-10-CM | POA: Insufficient documentation

## 2017-11-20 DIAGNOSIS — F329 Major depressive disorder, single episode, unspecified: Secondary | ICD-10-CM | POA: Insufficient documentation

## 2017-11-20 DIAGNOSIS — R195 Other fecal abnormalities: Secondary | ICD-10-CM | POA: Diagnosis present

## 2017-11-20 HISTORY — DX: Malignant (primary) neoplasm, unspecified: C80.1

## 2017-11-20 HISTORY — DX: Cerebral infarction, unspecified: I63.9

## 2017-11-20 HISTORY — PX: COLONOSCOPY WITH PROPOFOL: SHX5780

## 2017-11-20 HISTORY — DX: Failed or difficult intubation, initial encounter: T88.4XXA

## 2017-11-20 SURGERY — COLONOSCOPY WITH PROPOFOL
Anesthesia: Monitor Anesthesia Care

## 2017-11-20 MED ORDER — LACTATED RINGERS IV SOLN
INTRAVENOUS | Status: DC | PRN
  Administered 2017-11-20: 08:00:00 via INTRAVENOUS

## 2017-11-20 MED ORDER — LACTATED RINGERS IV SOLN
INTRAVENOUS | Status: DC
  Administered 2017-11-20: 08:00:00 via INTRAVENOUS

## 2017-11-20 MED ORDER — PROPOFOL 500 MG/50ML IV EMUL
INTRAVENOUS | Status: DC | PRN
  Administered 2017-11-20: 100 ug/kg/min via INTRAVENOUS
  Administered 2017-11-20: 75 ug/kg/min via INTRAVENOUS

## 2017-11-20 MED ORDER — ONDANSETRON HCL 4 MG/2ML IJ SOLN
INTRAMUSCULAR | Status: DC | PRN
  Administered 2017-11-20: 4 mg via INTRAVENOUS

## 2017-11-20 MED ORDER — PROPOFOL 10 MG/ML IV BOLUS
INTRAVENOUS | Status: DC | PRN
  Administered 2017-11-20 (×2): 20 mg via INTRAVENOUS

## 2017-11-20 MED ORDER — SODIUM CHLORIDE 0.9 % IV SOLN: INTRAVENOUS | Status: DC

## 2017-11-20 SURGICAL SUPPLY — 22 items

## 2017-11-20 NOTE — Op Note (Signed)
[facility] Patient Name: [name] Procedure Date : 11/20/2017 MRN: [id number] Attending MD: Lear Ng , MD Date of Birth: 09/15/1941 CSN: [id number] Age: 76 Admit Type: Outpatient Procedure:                Colonoscopy Indications:              Last colonoscopy: March 2009, Positive Cologuard                            test Providers:                Lear Ng, MD, Cleda Daub, RN,                            William Dalton, Technician Referring MD:             Maebelle Munroe. Darron Doom, MD Medicines:                Propofol per Anesthesia, Monitored Anesthesia Care Complications:            No immediate complications. Estimated Blood Loss:     Estimated blood loss was minimal. Procedure:                Pre-Anesthesia Assessment:                           - Prior to the procedure, a History and Physical                            was performed, and patient medications and                            allergies were reviewed. The patient's tolerance of                            previous anesthesia was also reviewed. The risks                            and benefits of the procedure and the sedation                            options and risks were discussed with the patient.                            All questions were answered, and informed consent                            was obtained. Prior Anticoagulants: The patient has                            taken no previous anticoagulant or antiplatelet                            agents. ASA Grade Assessment: II - A patient with  mild systemic disease. After reviewing the risks                            and benefits, the patient was deemed in                            satisfactory condition to undergo the procedure.                           After obtaining informed consent, the colonoscope                            was passed under direct vision. Throughout the          procedure, the patient's blood pressure, pulse, and                            oxygen saturations were monitored continuously. The                            EC-[id number] ([id number]) scope was introduced through                            the anus and advanced to the the cecum, identified                            by appendiceal orifice and ileocecal valve. The                            colonoscopy was extremely difficult due to                            significant looping and a tortuous colon.                            Successful completion of the procedure was aided by                            straightening and shortening the scope to obtain                            bowel loop reduction and applying abdominal                            pressure. The patient tolerated the procedure well.                            The quality of the bowel preparation was fair. The                            ileocecal valve, appendiceal orifice, and rectum                            were photographed. Scope In: 9:24:57 AM Scope Out: 9:57:13 AM  Scope Withdrawal Time: 0 hours 10 minutes 29 seconds  Total Procedure Duration: 0 hours 32 minutes 16 seconds  Findings:      The perianal and digital rectal examinations were normal.      Two semi-sessile polyps were found in the rectum. The polyps were 1 to 2       mm in size. These polyps were removed with a cold biopsy forceps.       Resection and retrieval were complete. Estimated blood loss was minimal.      Internal hemorrhoids were found during retroflexion. The hemorrhoids       were small and Grade I (internal hemorrhoids that do not prolapse). Impression:               - Preparation of the colon was fair.                           - Two 1 to 2 mm polyps in the rectum, removed with                            a cold biopsy forceps. Resected and retrieved.                           - Internal hemorrhoids. Moderate Sedation:      N/A - MAC  procedure Recommendation:           - Await pathology results.                           - Patient has a contact number available for                            emergencies. The signs and symptoms of potential                            delayed complications were discussed with the                            patient. Return to normal activities tomorrow.                            Written discharge instructions were provided to the                            patient.                           - High fiber diet.                           - Repeat colonoscopy is not recommended for                            surveillance.                           - Continue present medications. Procedure Code(s):        --- Professional ---  45380, Colonoscopy, flexible; with biopsy, single                            or multiple Diagnosis Code(s):        --- Professional ---                           R19.5, Other fecal abnormalities                           K62.1, Rectal polyp                           K64.0, First degree hemorrhoids CPT copyright 2016 American Medical Association. All rights reserved. The codes documented in this report are preliminary and upon coder review may  be revised to meet current compliance requirements. Lear Ng, MD 11/20/2017 10:16:30 AM This report has been signed electronically. Number of Addenda: 0

## 2017-11-20 NOTE — H&P (Signed)
Date of Initial H&P: 11/14/17  History reviewed, patient examined, no change in status, stable for surgery.

## 2017-11-20 NOTE — Interval H&P Note (Signed)
History and Physical Interval Note:  11/20/2017 8:38 AM  [name]  has presented today for surgery, with the diagnosis of positive cologuard test  The various methods of treatment have been discussed with the patient and family. After consideration of risks, benefits and other options for treatment, the patient has consented to  Procedure(s): COLONOSCOPY WITH PROPOFOL (N/A) as a surgical intervention .  The patient's history has been reviewed, patient examined, no change in status, stable for surgery.  I have reviewed the patient's chart and labs.  Questions were answered to the patient's satisfaction.     [place] C.

## 2017-11-20 NOTE — Discharge Instructions (Signed)
Monitored Anesthesia Care, Care After These instructions provide you with information about caring for yourself after your procedure. Your health care provider may also give you more specific instructions. Your treatment has been planned according to current medical practices, but problems sometimes occur. Call your health care provider if you have any problems or questions after your procedure. What can I expect after the procedure? After your procedure, it is common to:  Feel sleepy for several hours.  Feel clumsy and have poor balance for several hours.  Feel forgetful about what happened after the procedure.  Have poor judgment for several hours.  Feel nauseous or vomit.  Have a sore throat if you had a breathing tube during the procedure.  Follow these instructions at home: For at least 24 hours after the procedure:   Do not: ? Participate in activities in which you could fall or become injured. ? Drive. ? Use heavy machinery. ? Drink alcohol. ? Take sleeping pills or medicines that cause drowsiness. ? Make important decisions or sign legal documents. ? Take care of children on your own.  Rest. Eating and drinking  Follow the diet that is recommended by your health care provider.  If you vomit, drink water, juice, or soup when you can drink without vomiting.  Make sure you have little or no nausea before eating solid foods. General instructions  Have a responsible adult stay with you until you are awake and alert.  Take over-the-counter and prescription medicines only as told by your health care provider.  If you smoke, do not smoke without supervision.  Keep all follow-up visits as told by your health care provider. This is important. Contact a health care provider if:  You keep feeling nauseous or you keep vomiting.  You feel light-headed.  You develop a rash.  You have a fever. Get help right away if:  You have trouble breathing. This information is  not intended to replace advice given to you by your health care provider. Make sure you discuss any questions you have with your health care provider. Document Released: 03/25/2016 Document Revised: 07/25/2016 Document Reviewed: 03/25/2016 Elsevier Interactive Patient Education  2018 [place] HAD AN ENDOSCOPIC PROCEDURE TODAY: Refer to the procedure report and other information in the discharge instructions given to you for any specific questions about what was found during the examination. If this information does not answer your questions, please call [facility] office at [phone] to clarify.   YOU SHOULD EXPECT: Some feelings of bloating in the abdomen. Passage of more gas than usual. Walking can help get rid of the air that was put into your GI tract during the procedure and reduce the bloating. If you had a lower endoscopy (such as a colonoscopy or flexible sigmoidoscopy) you may notice spotting of blood in your stool or on the toilet paper. Some abdominal soreness may be present for a day or two, also.  DIET: Your first meal following the procedure should be a light meal and then it is ok to progress to your normal diet. A half-sandwich or bowl of soup is an example of a good first meal. Heavy or fried foods are harder to digest and may make you feel nauseous or bloated. Drink plenty of fluids but you should avoid alcoholic beverages for 24 hours. If you had a esophageal dilation, please see attached instructions for diet.   ACTIVITY: Your care partner should take you home directly after the procedure. You should plan to  take it easy, moving slowly for the rest of the day. You can resume normal activity the day after the procedure however YOU SHOULD NOT DRIVE, use power tools, machinery or perform tasks that involve climbing or major physical exertion for 24 hours (because of the sedation medicines used during the test).   SYMPTOMS TO REPORT IMMEDIATELY: A gastroenterologist can  be reached at any hour. Please call [phone]  for any of the following symptoms:  Following lower endoscopy (colonoscopy, flexible sigmoidoscopy) Excessive amounts of blood in the stool  Significant tenderness, worsening of abdominal pains  Swelling of the abdomen that is new, acute  Fever of 100 or higher    FOLLOW UP:  If any biopsies were taken you will be contacted by phone or by letter within the next 1-3 weeks. Call [phone]  if you have not heard about the biopsies in 3 weeks.  Please also call with any specific questions about appointments or follow up tests.

## 2017-11-20 NOTE — Transfer of Care (Signed)
Immediate Anesthesia Transfer of Care Note  Patient: [name]  Procedure(s) Performed: COLONOSCOPY WITH PROPOFOL (N/A )  Patient Location: Endoscopy Unit  Anesthesia Type:MAC  Level of Consciousness: awake, oriented and patient cooperative  Airway & Oxygen Therapy: Patient Spontanous Breathing and Patient connected to face mask oxygen  Post-op Assessment: Report given to RN and Post -op Vital signs reviewed and stable  Post vital signs: Reviewed  Last Vitals:  Vitals:   11/20/17 0730  BP: 125/80  Pulse: 85  Resp: 18  Temp: 36.8 C  SpO2: 95%    Last Pain:  Vitals:   11/20/17 0730  TempSrc: Oral         Complications: No apparent anesthesia complications

## 2017-11-20 NOTE — Anesthesia Procedure Notes (Signed)
Procedure Name: MAC Date/Time: 11/20/2017 9:10 AM Performed by: Jenne Campus, CRNA Pre-anesthesia Checklist: Patient identified, Emergency Drugs available, Suction available and Patient being monitored Oxygen Delivery Method: Simple face mask

## 2017-11-20 NOTE — Anesthesia Preprocedure Evaluation (Addendum)
Anesthesia Evaluation  Patient identified by MRN, date of birth, ID band Patient awake    Reviewed: Allergy & Precautions, NPO status , Patient's Chart, lab work & pertinent test results  History of Anesthesia Complications (+) DIFFICULT AIRWAY and POST - OP SPINAL HEADACHE  Airway Mallampati: I  TM Distance: >3 FB Neck ROM: Limited  Mouth opening: Limited Mouth Opening  Dental  (+) Teeth Intact, Dental Advisory Given   Pulmonary former smoker,    Pulmonary exam normal breath sounds clear to auscultation       Cardiovascular hypertension, Pt. on medications Normal cardiovascular exam Rhythm:Regular Rate:Normal     Neuro/Psych Anxiety Depression CVA    GI/Hepatic   Endo/Other    Renal/GU      Musculoskeletal   Abdominal   Peds  Hematology   Anesthesia Other Findings   Reproductive/Obstetrics                            Anesthesia Physical Anesthesia Plan  ASA: III  Anesthesia Plan: MAC   Post-op Pain Management:    Induction: Intravenous  PONV Risk Score and Plan: 1 and Ondansetron and Treatment may vary due to age or medical condition  Airway Management Planned: Simple Face Mask  Additional Equipment:   Intra-op Plan:   Post-operative Plan:   Informed Consent: I have reviewed the patients History and Physical, chart, labs and discussed the procedure including the risks, benefits and alternatives for the proposed anesthesia with the patient or authorized representative who has indicated his/her understanding and acceptance.     Plan Discussed with: CRNA and Surgeon  Anesthesia Plan Comments:         Anesthesia Quick Evaluation

## 2017-11-21 NOTE — Anesthesia Postprocedure Evaluation (Signed)
Anesthesia Post Note  Patient: [name]  Procedure(s) Performed: COLONOSCOPY WITH PROPOFOL (N/A )     Patient location during evaluation: PACU Anesthesia Type: MAC Level of consciousness: awake and alert Pain management: pain level controlled Vital Signs Assessment: post-procedure vital signs reviewed and stable Respiratory status: spontaneous breathing, nonlabored ventilation, respiratory function stable and patient connected to nasal cannula oxygen Cardiovascular status: stable and blood pressure returned to baseline Postop Assessment: no apparent nausea or vomiting Anesthetic complications: no    Last Vitals:  Vitals:   11/20/17 1005 11/20/17 1015  BP: (!) 117/55 (!) 105/45  Pulse: 84 82  Resp: (!) 22 16  Temp: 36.4 C   SpO2: 99% 96%    Last Pain:  Vitals:   11/20/17 1005  TempSrc: Oral                 Tomiko Schoon DAVID

## 2017-11-22 DIAGNOSIS — N390 Urinary tract infection, site not specified: Secondary | ICD-10-CM | POA: Diagnosis not present

## 2017-11-22 DIAGNOSIS — C44319 Basal cell carcinoma of skin of other parts of face: Secondary | ICD-10-CM | POA: Diagnosis not present

## 2017-11-25 ENCOUNTER — Ambulatory Visit: Payer: PRIVATE HEALTH INSURANCE | Admitting: Family Medicine

## 2017-11-27 DIAGNOSIS — C44310 Basal cell carcinoma of skin of unspecified parts of face: Secondary | ICD-10-CM | POA: Diagnosis not present

## 2017-11-27 DIAGNOSIS — G8929 Other chronic pain: Secondary | ICD-10-CM | POA: Diagnosis not present

## 2017-11-27 DIAGNOSIS — L219 Seborrheic dermatitis, unspecified: Secondary | ICD-10-CM | POA: Diagnosis not present

## 2017-11-27 DIAGNOSIS — R102 Pelvic and perineal pain: Secondary | ICD-10-CM | POA: Diagnosis not present

## 2017-12-02 ENCOUNTER — Telehealth (HOSPITAL_COMMUNITY): Payer: Self-pay | Admitting: Vascular Surgery

## 2017-12-02 NOTE — Progress Notes (Signed)
Anesthesia follow-up: [name] is s/p colonoscopy under anesthesia care on 11/20/17. He was scheduled to see Dr. Damita Dunnings on 11/25/17 as a new patient; however, appointment was cancelled due to inclement weather. As outlined in my 11/19/17 (with 11/20/17 update) note, patient had some abnormal findings during his 01/2016 hospitalization at [facility] including finding of aortic stenosis (aortic regurgitation ?), PFO and LLL lung nodule. It was unclear to me if results had been discussed with him and if follow-up had been arranged. I had inquired if Dr. Damita Dunnings could address when he met patient, but since appointment is no longer scheduled I called and spoke with [name] myself. He reported his PCP was "Dr. Darron Doom." (Based on his description of the location, I believe it is Dr. Horald Pollen with [place]). Fortunately, he did confirm that he is being followed by a cardiologist (for a "hole in his heart" and for "valve" issues) at the [facility]. He could not tell me the cardiologist's name, but says he has an appointment on 12/09/17 for routine follow-up. He said he also has an appointment on 12/04/17 for suture removal (following ear procedure) at Dr. Dennie Fetters office. He was not sure if he has had a chest CT this year. I dicussed that he had a lung nodule on the 01/2016 chest CTA at [facility] with one year follow-up recommended, so he should confirm with his PCP or cardiologist if this has been re-evaluated. I gave him my work number if his PCP or cardiologist need to call me and get more information.   George Hugh [facility] Stay [facility]/Anesthesiology Phone [phone] 12/02/2017 3:33 PM

## 2017-12-04 ENCOUNTER — Ambulatory Visit
Admission: RE | Admit: 2017-12-04 | Discharge: 2017-12-04 | Disposition: A | Discharge status: Home / Self Care | Payer: Medicare Other | Source: Ambulatory Visit | Attending: Family Medicine | Admitting: Family Medicine

## 2017-12-04 ENCOUNTER — Other Ambulatory Visit: Payer: Self-pay | Admitting: Family Medicine

## 2017-12-04 DIAGNOSIS — M545 Low back pain: Secondary | ICD-10-CM

## 2017-12-04 DIAGNOSIS — M12812 Other specific arthropathies, not elsewhere classified, left shoulder: Secondary | ICD-10-CM | POA: Diagnosis not present

## 2017-12-04 DIAGNOSIS — M25552 Pain in left hip: Secondary | ICD-10-CM

## 2017-12-04 DIAGNOSIS — M5136 Other intervertebral disc degeneration, lumbar region: Secondary | ICD-10-CM | POA: Diagnosis not present

## 2017-12-04 DIAGNOSIS — N529 Male erectile dysfunction, unspecified: Secondary | ICD-10-CM | POA: Diagnosis not present

## 2017-12-04 DIAGNOSIS — I10 Essential (primary) hypertension: Secondary | ICD-10-CM | POA: Diagnosis not present

## 2017-12-04 DIAGNOSIS — R5383 Other fatigue: Secondary | ICD-10-CM | POA: Diagnosis not present

## 2017-12-04 DIAGNOSIS — G8929 Other chronic pain: Secondary | ICD-10-CM | POA: Diagnosis not present

## 2017-12-04 DIAGNOSIS — D649 Anemia, unspecified: Secondary | ICD-10-CM | POA: Diagnosis not present

## 2017-12-20 DIAGNOSIS — F411 Generalized anxiety disorder: Secondary | ICD-10-CM | POA: Diagnosis not present

## 2017-12-20 DIAGNOSIS — F39 Unspecified mood [affective] disorder: Secondary | ICD-10-CM | POA: Diagnosis not present

## 2017-12-20 DIAGNOSIS — F259 Schizoaffective disorder, unspecified: Secondary | ICD-10-CM | POA: Diagnosis not present

## 2017-12-23 DIAGNOSIS — F39 Unspecified mood [affective] disorder: Secondary | ICD-10-CM | POA: Diagnosis not present

## 2017-12-23 DIAGNOSIS — F259 Schizoaffective disorder, unspecified: Secondary | ICD-10-CM | POA: Diagnosis not present

## 2017-12-27 DIAGNOSIS — I10 Essential (primary) hypertension: Secondary | ICD-10-CM | POA: Diagnosis not present

## 2017-12-27 DIAGNOSIS — M5416 Radiculopathy, lumbar region: Secondary | ICD-10-CM | POA: Diagnosis not present

## 2017-12-27 DIAGNOSIS — G8929 Other chronic pain: Secondary | ICD-10-CM | POA: Diagnosis not present

## 2017-12-27 DIAGNOSIS — F259 Schizoaffective disorder, unspecified: Secondary | ICD-10-CM | POA: Diagnosis not present

## 2017-12-30 DIAGNOSIS — F39 Unspecified mood [affective] disorder: Secondary | ICD-10-CM | POA: Diagnosis not present

## 2017-12-30 DIAGNOSIS — M5416 Radiculopathy, lumbar region: Secondary | ICD-10-CM | POA: Diagnosis not present

## 2017-12-30 DIAGNOSIS — G8929 Other chronic pain: Secondary | ICD-10-CM | POA: Diagnosis not present

## 2018-01-01 DIAGNOSIS — M5136 Other intervertebral disc degeneration, lumbar region: Secondary | ICD-10-CM | POA: Diagnosis not present

## 2018-01-01 DIAGNOSIS — M48061 Spinal stenosis, lumbar region without neurogenic claudication: Secondary | ICD-10-CM | POA: Diagnosis not present

## 2018-01-10 ENCOUNTER — Other Ambulatory Visit: Payer: Self-pay | Admitting: Orthopedic Surgery

## 2018-01-10 DIAGNOSIS — M48061 Spinal stenosis, lumbar region without neurogenic claudication: Secondary | ICD-10-CM

## 2018-01-16 DIAGNOSIS — F259 Schizoaffective disorder, unspecified: Secondary | ICD-10-CM | POA: Diagnosis not present

## 2018-01-16 DIAGNOSIS — G8929 Other chronic pain: Secondary | ICD-10-CM | POA: Diagnosis not present

## 2018-01-16 DIAGNOSIS — M5416 Radiculopathy, lumbar region: Secondary | ICD-10-CM | POA: Diagnosis not present

## 2018-01-16 DIAGNOSIS — M542 Cervicalgia: Secondary | ICD-10-CM | POA: Diagnosis not present

## 2018-01-23 DIAGNOSIS — M5416 Radiculopathy, lumbar region: Secondary | ICD-10-CM | POA: Diagnosis not present

## 2018-01-23 DIAGNOSIS — I10 Essential (primary) hypertension: Secondary | ICD-10-CM | POA: Diagnosis not present

## 2018-01-23 DIAGNOSIS — G8929 Other chronic pain: Secondary | ICD-10-CM | POA: Diagnosis not present

## 2018-01-23 DIAGNOSIS — F39 Unspecified mood [affective] disorder: Secondary | ICD-10-CM | POA: Diagnosis not present

## 2018-01-27 ENCOUNTER — Ambulatory Visit
Admission: RE | Admit: 2018-01-27 | Discharge: 2018-01-27 | Disposition: A | Discharge status: Home / Self Care | Payer: Medicare Other | Source: Ambulatory Visit | Attending: Orthopedic Surgery | Admitting: Orthopedic Surgery

## 2018-01-27 DIAGNOSIS — M5126 Other intervertebral disc displacement, lumbar region: Secondary | ICD-10-CM | POA: Diagnosis not present

## 2018-01-27 DIAGNOSIS — M48061 Spinal stenosis, lumbar region without neurogenic claudication: Secondary | ICD-10-CM

## 2018-01-27 MED ORDER — DIAZEPAM 5 MG PO TABS
5.0000 mg | ORAL_TABLET | Freq: Once | ORAL | Status: AC
  Administered 2018-01-27: 10 mg via ORAL

## 2018-01-27 MED ORDER — IOPAMIDOL (ISOVUE-M 200) INJECTION 41%
10.0000 mL | Freq: Once | INTRAMUSCULAR | Status: AC
  Administered 2018-01-27: 10 mL via INTRATHECAL

## 2018-01-27 NOTE — Discharge Instructions (Signed)
Myelogram Discharge Instructions  1. Go home and rest quietly for the next 24 hours.  It is important to lie flat for the next 24 hours.  Get up only to go to the restroom.  You may lie in the bed or on a couch on your back, your stomach, your left side or your right side.  You may have one pillow under your head.  You may have pillows between your knees while you are on your side or under your knees while you are on your back.  2. DO NOT drive today.  Recline the seat as far back as it will go, while still wearing your seat belt, on the way home.  3. You may get up to go to the bathroom as needed.  You may sit up for 10 minutes to eat.  You may resume your normal diet and medications unless otherwise indicated.  Drink lots of extra fluids today and tomorrow.  4. The incidence of headache, nausea, or vomiting is about 5% (one in 20 patients).  If you develop a headache, lie flat and drink plenty of fluids until the headache goes away.  Caffeinated beverages may be helpful.  If you develop severe nausea and vomiting or a headache that does not go away with flat bed rest, call [phone].  5. You may resume normal activities after your 24 hours of bed rest is over; however, do not exert yourself strongly or do any heavy lifting tomorrow. If when you get up you have a headache when standing, go back to bed and force fluids for another 24 hours.  6. Call your physician for a follow-up appointment.  The results of your myelogram will be sent directly to your physician by the following day.  7. If you have any questions or if complications develop after you arrive home, please call [phone].  Discharge instructions have been explained to the patient.  The patient, or the person responsible for the patient, fully understands these instructions.  YOU MAY RESTART YOUR [facility] TOMORROW 01/28/2018 AT 10:30AM.

## 2018-01-29 DIAGNOSIS — Z79899 Other long term (current) drug therapy: Secondary | ICD-10-CM | POA: Diagnosis not present

## 2018-01-29 DIAGNOSIS — M542 Cervicalgia: Secondary | ICD-10-CM | POA: Diagnosis not present

## 2018-02-01 DIAGNOSIS — I350 Nonrheumatic aortic (valve) stenosis: Secondary | ICD-10-CM | POA: Diagnosis not present

## 2018-02-01 DIAGNOSIS — F39 Unspecified mood [affective] disorder: Secondary | ICD-10-CM | POA: Diagnosis not present

## 2018-02-01 DIAGNOSIS — T148XXA Other injury of unspecified body region, initial encounter: Secondary | ICD-10-CM | POA: Diagnosis not present

## 2018-02-01 DIAGNOSIS — M5416 Radiculopathy, lumbar region: Secondary | ICD-10-CM | POA: Diagnosis not present

## 2018-02-04 DIAGNOSIS — I350 Nonrheumatic aortic (valve) stenosis: Secondary | ICD-10-CM | POA: Diagnosis not present

## 2018-02-04 DIAGNOSIS — G8929 Other chronic pain: Secondary | ICD-10-CM | POA: Diagnosis not present

## 2018-02-04 DIAGNOSIS — F39 Unspecified mood [affective] disorder: Secondary | ICD-10-CM | POA: Diagnosis not present

## 2018-02-04 DIAGNOSIS — L219 Seborrheic dermatitis, unspecified: Secondary | ICD-10-CM | POA: Diagnosis not present

## 2018-02-04 DIAGNOSIS — R42 Dizziness and giddiness: Secondary | ICD-10-CM | POA: Diagnosis not present

## 2018-02-04 DIAGNOSIS — L309 Dermatitis, unspecified: Secondary | ICD-10-CM | POA: Diagnosis not present

## 2018-02-04 DIAGNOSIS — L989 Disorder of the skin and subcutaneous tissue, unspecified: Secondary | ICD-10-CM | POA: Diagnosis not present

## 2018-02-07 DIAGNOSIS — M48061 Spinal stenosis, lumbar region without neurogenic claudication: Secondary | ICD-10-CM | POA: Diagnosis not present

## 2018-02-07 DIAGNOSIS — M542 Cervicalgia: Secondary | ICD-10-CM | POA: Diagnosis not present

## 2018-02-12 DIAGNOSIS — M542 Cervicalgia: Secondary | ICD-10-CM | POA: Diagnosis not present

## 2018-02-12 DIAGNOSIS — G8929 Other chronic pain: Secondary | ICD-10-CM | POA: Diagnosis not present

## 2018-02-12 DIAGNOSIS — M545 Low back pain: Secondary | ICD-10-CM | POA: Diagnosis not present

## 2018-02-12 DIAGNOSIS — Z79899 Other long term (current) drug therapy: Secondary | ICD-10-CM | POA: Diagnosis not present

## 2018-02-13 DIAGNOSIS — I350 Nonrheumatic aortic (valve) stenosis: Secondary | ICD-10-CM | POA: Diagnosis not present

## 2018-02-13 DIAGNOSIS — F39 Unspecified mood [affective] disorder: Secondary | ICD-10-CM | POA: Diagnosis not present

## 2018-02-13 DIAGNOSIS — G8929 Other chronic pain: Secondary | ICD-10-CM | POA: Diagnosis not present

## 2018-02-13 DIAGNOSIS — M5416 Radiculopathy, lumbar region: Secondary | ICD-10-CM | POA: Diagnosis not present

## 2018-02-13 DIAGNOSIS — E291 Testicular hypofunction: Secondary | ICD-10-CM | POA: Diagnosis not present

## 2018-02-20 DIAGNOSIS — E291 Testicular hypofunction: Secondary | ICD-10-CM | POA: Diagnosis not present

## 2018-02-20 DIAGNOSIS — N529 Male erectile dysfunction, unspecified: Secondary | ICD-10-CM | POA: Diagnosis not present

## 2018-02-20 DIAGNOSIS — M5416 Radiculopathy, lumbar region: Secondary | ICD-10-CM | POA: Diagnosis not present

## 2018-02-20 DIAGNOSIS — I10 Essential (primary) hypertension: Secondary | ICD-10-CM | POA: Diagnosis not present

## 2018-03-04 DIAGNOSIS — M48061 Spinal stenosis, lumbar region without neurogenic claudication: Secondary | ICD-10-CM | POA: Diagnosis not present

## 2018-03-04 DIAGNOSIS — M542 Cervicalgia: Secondary | ICD-10-CM | POA: Diagnosis not present

## 2018-03-06 DIAGNOSIS — R635 Abnormal weight gain: Secondary | ICD-10-CM | POA: Diagnosis not present

## 2018-03-06 DIAGNOSIS — I10 Essential (primary) hypertension: Secondary | ICD-10-CM | POA: Diagnosis not present

## 2018-03-06 DIAGNOSIS — E291 Testicular hypofunction: Secondary | ICD-10-CM | POA: Diagnosis not present

## 2018-03-06 DIAGNOSIS — M545 Low back pain: Secondary | ICD-10-CM | POA: Diagnosis not present

## 2018-03-12 DIAGNOSIS — M542 Cervicalgia: Secondary | ICD-10-CM | POA: Diagnosis not present

## 2018-03-12 DIAGNOSIS — M545 Low back pain: Secondary | ICD-10-CM | POA: Diagnosis not present

## 2018-03-12 DIAGNOSIS — G8929 Other chronic pain: Secondary | ICD-10-CM | POA: Diagnosis not present

## 2018-03-12 DIAGNOSIS — Z79899 Other long term (current) drug therapy: Secondary | ICD-10-CM | POA: Diagnosis not present

## 2018-03-25 DIAGNOSIS — F39 Unspecified mood [affective] disorder: Secondary | ICD-10-CM | POA: Diagnosis not present

## 2018-03-25 DIAGNOSIS — E291 Testicular hypofunction: Secondary | ICD-10-CM | POA: Diagnosis not present

## 2018-03-25 DIAGNOSIS — I350 Nonrheumatic aortic (valve) stenosis: Secondary | ICD-10-CM | POA: Diagnosis not present

## 2018-03-26 DIAGNOSIS — M4712 Other spondylosis with myelopathy, cervical region: Secondary | ICD-10-CM | POA: Diagnosis not present

## 2018-03-27 DIAGNOSIS — I350 Nonrheumatic aortic (valve) stenosis: Secondary | ICD-10-CM | POA: Diagnosis not present

## 2018-03-27 DIAGNOSIS — E291 Testicular hypofunction: Secondary | ICD-10-CM | POA: Diagnosis not present

## 2018-03-27 DIAGNOSIS — L249 Irritant contact dermatitis, unspecified cause: Secondary | ICD-10-CM | POA: Diagnosis not present

## 2018-03-27 DIAGNOSIS — G8929 Other chronic pain: Secondary | ICD-10-CM | POA: Diagnosis not present

## 2018-04-10 DIAGNOSIS — M545 Low back pain: Secondary | ICD-10-CM | POA: Diagnosis not present

## 2018-04-10 DIAGNOSIS — Z79899 Other long term (current) drug therapy: Secondary | ICD-10-CM | POA: Diagnosis not present

## 2018-04-10 DIAGNOSIS — M542 Cervicalgia: Secondary | ICD-10-CM | POA: Diagnosis not present

## 2018-04-10 DIAGNOSIS — M1990 Unspecified osteoarthritis, unspecified site: Secondary | ICD-10-CM | POA: Diagnosis not present

## 2018-04-23 DIAGNOSIS — F39 Unspecified mood [affective] disorder: Secondary | ICD-10-CM | POA: Diagnosis not present

## 2018-04-23 DIAGNOSIS — N529 Male erectile dysfunction, unspecified: Secondary | ICD-10-CM | POA: Diagnosis not present

## 2018-04-23 DIAGNOSIS — G8929 Other chronic pain: Secondary | ICD-10-CM | POA: Diagnosis not present

## 2018-04-23 DIAGNOSIS — I10 Essential (primary) hypertension: Secondary | ICD-10-CM | POA: Diagnosis not present

## 2018-04-29 DIAGNOSIS — G8929 Other chronic pain: Secondary | ICD-10-CM | POA: Diagnosis not present

## 2018-04-29 DIAGNOSIS — M5090 Cervical disc disorder, unspecified, unspecified cervical region: Secondary | ICD-10-CM | POA: Diagnosis not present

## 2018-04-29 DIAGNOSIS — M1990 Unspecified osteoarthritis, unspecified site: Secondary | ICD-10-CM | POA: Diagnosis not present

## 2018-04-29 DIAGNOSIS — M545 Low back pain: Secondary | ICD-10-CM | POA: Diagnosis not present

## 2018-04-30 DIAGNOSIS — M19112 Post-traumatic osteoarthritis, left shoulder: Secondary | ICD-10-CM | POA: Diagnosis not present

## 2018-04-30 DIAGNOSIS — M25512 Pain in left shoulder: Secondary | ICD-10-CM | POA: Diagnosis not present

## 2018-04-30 DIAGNOSIS — M25511 Pain in right shoulder: Secondary | ICD-10-CM | POA: Diagnosis not present

## 2018-04-30 DIAGNOSIS — M19111 Post-traumatic osteoarthritis, right shoulder: Secondary | ICD-10-CM | POA: Diagnosis not present

## 2018-05-02 DIAGNOSIS — F39 Unspecified mood [affective] disorder: Secondary | ICD-10-CM | POA: Diagnosis not present

## 2018-05-02 DIAGNOSIS — T148XXA Other injury of unspecified body region, initial encounter: Secondary | ICD-10-CM | POA: Diagnosis not present

## 2018-05-02 DIAGNOSIS — I350 Nonrheumatic aortic (valve) stenosis: Secondary | ICD-10-CM | POA: Diagnosis not present

## 2018-05-02 DIAGNOSIS — E291 Testicular hypofunction: Secondary | ICD-10-CM | POA: Diagnosis not present

## 2018-05-14 DIAGNOSIS — E669 Obesity, unspecified: Secondary | ICD-10-CM | POA: Diagnosis not present

## 2018-05-14 DIAGNOSIS — R0689 Other abnormalities of breathing: Secondary | ICD-10-CM | POA: Diagnosis not present

## 2018-05-14 DIAGNOSIS — Z8673 Personal history of transient ischemic attack (TIA), and cerebral infarction without residual deficits: Secondary | ICD-10-CM | POA: Diagnosis not present

## 2018-05-14 DIAGNOSIS — G894 Chronic pain syndrome: Secondary | ICD-10-CM | POA: Diagnosis not present

## 2018-05-14 DIAGNOSIS — R0683 Snoring: Secondary | ICD-10-CM | POA: Diagnosis not present

## 2018-05-14 DIAGNOSIS — Z87891 Personal history of nicotine dependence: Secondary | ICD-10-CM | POA: Diagnosis not present

## 2018-05-14 DIAGNOSIS — I4891 Unspecified atrial fibrillation: Secondary | ICD-10-CM | POA: Diagnosis not present

## 2018-05-14 DIAGNOSIS — G2581 Restless legs syndrome: Secondary | ICD-10-CM | POA: Diagnosis not present

## 2018-05-14 DIAGNOSIS — G4700 Insomnia, unspecified: Secondary | ICD-10-CM | POA: Diagnosis not present

## 2018-05-16 DIAGNOSIS — I10 Essential (primary) hypertension: Secondary | ICD-10-CM | POA: Diagnosis not present

## 2018-05-16 DIAGNOSIS — E538 Deficiency of other specified B group vitamins: Secondary | ICD-10-CM | POA: Diagnosis not present

## 2018-05-16 DIAGNOSIS — L570 Actinic keratosis: Secondary | ICD-10-CM | POA: Diagnosis not present

## 2018-05-16 DIAGNOSIS — Z113 Encounter for screening for infections with a predominantly sexual mode of transmission: Secondary | ICD-10-CM | POA: Diagnosis not present

## 2018-05-16 DIAGNOSIS — E291 Testicular hypofunction: Secondary | ICD-10-CM | POA: Diagnosis not present

## 2018-05-16 DIAGNOSIS — R7989 Other specified abnormal findings of blood chemistry: Secondary | ICD-10-CM | POA: Diagnosis not present

## 2018-05-16 DIAGNOSIS — D510 Vitamin B12 deficiency anemia due to intrinsic factor deficiency: Secondary | ICD-10-CM | POA: Diagnosis not present

## 2018-05-16 DIAGNOSIS — I350 Nonrheumatic aortic (valve) stenosis: Secondary | ICD-10-CM | POA: Diagnosis not present

## 2018-05-17 HISTORY — PX: CARDIAC VALVE REPLACEMENT: SHX585

## 2018-05-29 DIAGNOSIS — M47816 Spondylosis without myelopathy or radiculopathy, lumbar region: Secondary | ICD-10-CM | POA: Diagnosis not present

## 2018-05-29 DIAGNOSIS — M47812 Spondylosis without myelopathy or radiculopathy, cervical region: Secondary | ICD-10-CM | POA: Diagnosis not present

## 2018-05-29 DIAGNOSIS — G959 Disease of spinal cord, unspecified: Secondary | ICD-10-CM | POA: Diagnosis not present

## 2018-06-03 DIAGNOSIS — M1990 Unspecified osteoarthritis, unspecified site: Secondary | ICD-10-CM | POA: Diagnosis not present

## 2018-06-03 DIAGNOSIS — Z79899 Other long term (current) drug therapy: Secondary | ICD-10-CM | POA: Diagnosis not present

## 2018-06-03 DIAGNOSIS — M5090 Cervical disc disorder, unspecified, unspecified cervical region: Secondary | ICD-10-CM | POA: Diagnosis not present

## 2018-06-03 DIAGNOSIS — M545 Low back pain: Secondary | ICD-10-CM | POA: Diagnosis not present

## 2018-06-03 DIAGNOSIS — G8929 Other chronic pain: Secondary | ICD-10-CM | POA: Diagnosis not present

## 2018-06-24 DIAGNOSIS — D510 Vitamin B12 deficiency anemia due to intrinsic factor deficiency: Secondary | ICD-10-CM | POA: Diagnosis not present

## 2018-06-24 DIAGNOSIS — G8929 Other chronic pain: Secondary | ICD-10-CM | POA: Diagnosis not present

## 2018-06-24 DIAGNOSIS — E291 Testicular hypofunction: Secondary | ICD-10-CM | POA: Diagnosis not present

## 2018-06-24 DIAGNOSIS — F39 Unspecified mood [affective] disorder: Secondary | ICD-10-CM | POA: Diagnosis not present

## 2018-06-25 DIAGNOSIS — M545 Low back pain: Secondary | ICD-10-CM | POA: Diagnosis not present

## 2018-07-02 DIAGNOSIS — I959 Hypotension, unspecified: Secondary | ICD-10-CM | POA: Diagnosis not present

## 2018-07-02 DIAGNOSIS — E861 Hypovolemia: Secondary | ICD-10-CM | POA: Diagnosis not present

## 2018-07-02 DIAGNOSIS — R0602 Shortness of breath: Secondary | ICD-10-CM | POA: Diagnosis not present

## 2018-07-02 DIAGNOSIS — Z952 Presence of prosthetic heart valve: Secondary | ICD-10-CM | POA: Diagnosis not present

## 2018-07-02 DIAGNOSIS — R000 Tachycardia, unspecified: Secondary | ICD-10-CM | POA: Diagnosis not present

## 2018-07-02 DIAGNOSIS — I10 Essential (primary) hypertension: Secondary | ICD-10-CM | POA: Diagnosis not present

## 2018-07-02 DIAGNOSIS — N179 Acute kidney failure, unspecified: Secondary | ICD-10-CM | POA: Diagnosis not present

## 2018-07-02 DIAGNOSIS — D649 Anemia, unspecified: Secondary | ICD-10-CM | POA: Diagnosis not present

## 2018-07-02 DIAGNOSIS — R42 Dizziness and giddiness: Secondary | ICD-10-CM | POA: Diagnosis not present

## 2018-07-02 DIAGNOSIS — E785 Hyperlipidemia, unspecified: Secondary | ICD-10-CM | POA: Diagnosis not present

## 2018-07-02 DIAGNOSIS — I9589 Other hypotension: Secondary | ICD-10-CM | POA: Diagnosis not present

## 2018-07-02 DIAGNOSIS — Z7901 Long term (current) use of anticoagulants: Secondary | ICD-10-CM | POA: Diagnosis not present

## 2018-07-02 DIAGNOSIS — R531 Weakness: Secondary | ICD-10-CM | POA: Diagnosis not present

## 2018-07-02 DIAGNOSIS — R0902 Hypoxemia: Secondary | ICD-10-CM | POA: Diagnosis not present

## 2018-07-02 DIAGNOSIS — Z87891 Personal history of nicotine dependence: Secondary | ICD-10-CM | POA: Diagnosis not present

## 2018-07-02 DIAGNOSIS — Z79899 Other long term (current) drug therapy: Secondary | ICD-10-CM | POA: Diagnosis not present

## 2018-07-02 DIAGNOSIS — I4891 Unspecified atrial fibrillation: Secondary | ICD-10-CM | POA: Diagnosis not present

## 2018-07-04 DIAGNOSIS — F259 Schizoaffective disorder, unspecified: Secondary | ICD-10-CM | POA: Diagnosis not present

## 2018-07-04 DIAGNOSIS — E785 Hyperlipidemia, unspecified: Secondary | ICD-10-CM | POA: Diagnosis not present

## 2018-07-04 DIAGNOSIS — M5090 Cervical disc disorder, unspecified, unspecified cervical region: Secondary | ICD-10-CM | POA: Diagnosis not present

## 2018-07-04 DIAGNOSIS — G894 Chronic pain syndrome: Secondary | ICD-10-CM | POA: Diagnosis not present

## 2018-07-04 DIAGNOSIS — M1990 Unspecified osteoarthritis, unspecified site: Secondary | ICD-10-CM | POA: Diagnosis not present

## 2018-07-04 DIAGNOSIS — I959 Hypotension, unspecified: Secondary | ICD-10-CM | POA: Diagnosis not present

## 2018-07-04 DIAGNOSIS — I359 Nonrheumatic aortic valve disorder, unspecified: Secondary | ICD-10-CM | POA: Diagnosis not present

## 2018-07-04 DIAGNOSIS — Z79899 Other long term (current) drug therapy: Secondary | ICD-10-CM | POA: Diagnosis not present

## 2018-07-08 DIAGNOSIS — E119 Type 2 diabetes mellitus without complications: Secondary | ICD-10-CM | POA: Diagnosis not present

## 2018-07-08 DIAGNOSIS — I350 Nonrheumatic aortic (valve) stenosis: Secondary | ICD-10-CM | POA: Diagnosis not present

## 2018-07-08 DIAGNOSIS — E291 Testicular hypofunction: Secondary | ICD-10-CM | POA: Diagnosis not present

## 2018-07-08 DIAGNOSIS — M542 Cervicalgia: Secondary | ICD-10-CM | POA: Diagnosis not present

## 2018-07-08 DIAGNOSIS — D649 Anemia, unspecified: Secondary | ICD-10-CM | POA: Diagnosis not present

## 2018-07-08 DIAGNOSIS — M545 Low back pain: Secondary | ICD-10-CM | POA: Diagnosis not present

## 2018-07-21 DIAGNOSIS — M542 Cervicalgia: Secondary | ICD-10-CM | POA: Diagnosis not present

## 2018-07-21 DIAGNOSIS — M5090 Cervical disc disorder, unspecified, unspecified cervical region: Secondary | ICD-10-CM | POA: Diagnosis not present

## 2018-07-21 DIAGNOSIS — Z79899 Other long term (current) drug therapy: Secondary | ICD-10-CM | POA: Diagnosis not present

## 2018-07-21 DIAGNOSIS — M1990 Unspecified osteoarthritis, unspecified site: Secondary | ICD-10-CM | POA: Diagnosis not present

## 2018-07-25 DIAGNOSIS — M545 Low back pain: Secondary | ICD-10-CM | POA: Diagnosis not present

## 2018-07-25 DIAGNOSIS — D510 Vitamin B12 deficiency anemia due to intrinsic factor deficiency: Secondary | ICD-10-CM | POA: Diagnosis not present

## 2018-07-25 DIAGNOSIS — F39 Unspecified mood [affective] disorder: Secondary | ICD-10-CM | POA: Diagnosis not present

## 2018-07-25 DIAGNOSIS — G8929 Other chronic pain: Secondary | ICD-10-CM | POA: Diagnosis not present

## 2018-07-25 DIAGNOSIS — E291 Testicular hypofunction: Secondary | ICD-10-CM | POA: Diagnosis not present

## 2018-08-01 DIAGNOSIS — M1990 Unspecified osteoarthritis, unspecified site: Secondary | ICD-10-CM | POA: Diagnosis not present

## 2018-08-01 DIAGNOSIS — G894 Chronic pain syndrome: Secondary | ICD-10-CM | POA: Diagnosis not present

## 2018-08-01 DIAGNOSIS — M5090 Cervical disc disorder, unspecified, unspecified cervical region: Secondary | ICD-10-CM | POA: Diagnosis not present

## 2018-08-01 DIAGNOSIS — Z79899 Other long term (current) drug therapy: Secondary | ICD-10-CM | POA: Diagnosis not present

## 2018-08-05 DIAGNOSIS — M545 Low back pain: Secondary | ICD-10-CM | POA: Diagnosis not present

## 2018-08-05 DIAGNOSIS — E8770 Fluid overload, unspecified: Secondary | ICD-10-CM | POA: Diagnosis not present

## 2018-08-05 DIAGNOSIS — I10 Essential (primary) hypertension: Secondary | ICD-10-CM | POA: Diagnosis not present

## 2018-08-05 DIAGNOSIS — M542 Cervicalgia: Secondary | ICD-10-CM | POA: Diagnosis not present

## 2018-08-05 DIAGNOSIS — Z23 Encounter for immunization: Secondary | ICD-10-CM | POA: Diagnosis not present

## 2018-08-05 DIAGNOSIS — D649 Anemia, unspecified: Secondary | ICD-10-CM | POA: Diagnosis not present

## 2018-08-05 DIAGNOSIS — R946 Abnormal results of thyroid function studies: Secondary | ICD-10-CM | POA: Diagnosis not present

## 2018-08-05 DIAGNOSIS — Z3141 Encounter for fertility testing: Secondary | ICD-10-CM | POA: Diagnosis not present

## 2018-08-05 DIAGNOSIS — R600 Localized edema: Secondary | ICD-10-CM | POA: Diagnosis not present

## 2018-08-05 DIAGNOSIS — I5040 Unspecified combined systolic (congestive) and diastolic (congestive) heart failure: Secondary | ICD-10-CM | POA: Diagnosis not present

## 2018-08-05 DIAGNOSIS — G8929 Other chronic pain: Secondary | ICD-10-CM | POA: Diagnosis not present

## 2018-08-11 DIAGNOSIS — F39 Unspecified mood [affective] disorder: Secondary | ICD-10-CM | POA: Diagnosis not present

## 2018-08-11 DIAGNOSIS — N390 Urinary tract infection, site not specified: Secondary | ICD-10-CM | POA: Diagnosis not present

## 2018-08-11 DIAGNOSIS — M542 Cervicalgia: Secondary | ICD-10-CM | POA: Diagnosis not present

## 2018-08-11 DIAGNOSIS — R293 Abnormal posture: Secondary | ICD-10-CM | POA: Diagnosis not present

## 2018-08-11 DIAGNOSIS — M545 Low back pain: Secondary | ICD-10-CM | POA: Diagnosis not present

## 2018-08-11 DIAGNOSIS — M5416 Radiculopathy, lumbar region: Secondary | ICD-10-CM | POA: Diagnosis not present

## 2018-08-11 DIAGNOSIS — R079 Chest pain, unspecified: Secondary | ICD-10-CM | POA: Diagnosis not present

## 2018-08-11 DIAGNOSIS — M2560 Stiffness of unspecified joint, not elsewhere classified: Secondary | ICD-10-CM | POA: Diagnosis not present

## 2018-08-12 DIAGNOSIS — N390 Urinary tract infection, site not specified: Secondary | ICD-10-CM | POA: Diagnosis not present

## 2018-08-12 DIAGNOSIS — R079 Chest pain, unspecified: Secondary | ICD-10-CM | POA: Diagnosis not present

## 2018-08-13 DIAGNOSIS — R293 Abnormal posture: Secondary | ICD-10-CM | POA: Diagnosis not present

## 2018-08-13 DIAGNOSIS — M545 Low back pain: Secondary | ICD-10-CM | POA: Diagnosis not present

## 2018-08-13 DIAGNOSIS — M542 Cervicalgia: Secondary | ICD-10-CM | POA: Diagnosis not present

## 2018-08-13 DIAGNOSIS — M2560 Stiffness of unspecified joint, not elsewhere classified: Secondary | ICD-10-CM | POA: Diagnosis not present

## 2018-08-15 DIAGNOSIS — R293 Abnormal posture: Secondary | ICD-10-CM | POA: Diagnosis not present

## 2018-08-15 DIAGNOSIS — M2560 Stiffness of unspecified joint, not elsewhere classified: Secondary | ICD-10-CM | POA: Diagnosis not present

## 2018-08-15 DIAGNOSIS — M545 Low back pain: Secondary | ICD-10-CM | POA: Diagnosis not present

## 2018-08-15 DIAGNOSIS — M542 Cervicalgia: Secondary | ICD-10-CM | POA: Diagnosis not present

## 2018-08-18 DIAGNOSIS — K5900 Constipation, unspecified: Secondary | ICD-10-CM | POA: Diagnosis not present

## 2018-08-18 DIAGNOSIS — R110 Nausea: Secondary | ICD-10-CM | POA: Diagnosis not present

## 2018-08-18 DIAGNOSIS — N10 Acute tubulo-interstitial nephritis: Secondary | ICD-10-CM | POA: Diagnosis not present

## 2018-08-19 DIAGNOSIS — N10 Acute tubulo-interstitial nephritis: Secondary | ICD-10-CM | POA: Diagnosis not present

## 2018-08-19 DIAGNOSIS — M542 Cervicalgia: Secondary | ICD-10-CM | POA: Diagnosis not present

## 2018-08-19 DIAGNOSIS — G894 Chronic pain syndrome: Secondary | ICD-10-CM | POA: Diagnosis not present

## 2018-08-19 DIAGNOSIS — L989 Disorder of the skin and subcutaneous tissue, unspecified: Secondary | ICD-10-CM | POA: Diagnosis not present

## 2018-08-19 DIAGNOSIS — M5090 Cervical disc disorder, unspecified, unspecified cervical region: Secondary | ICD-10-CM | POA: Diagnosis not present

## 2018-08-19 DIAGNOSIS — I10 Essential (primary) hypertension: Secondary | ICD-10-CM | POA: Diagnosis not present

## 2018-08-19 DIAGNOSIS — N390 Urinary tract infection, site not specified: Secondary | ICD-10-CM | POA: Diagnosis not present

## 2018-08-19 DIAGNOSIS — Z79899 Other long term (current) drug therapy: Secondary | ICD-10-CM | POA: Diagnosis not present

## 2018-08-19 DIAGNOSIS — F259 Schizoaffective disorder, unspecified: Secondary | ICD-10-CM | POA: Diagnosis not present

## 2018-08-19 DIAGNOSIS — R82998 Other abnormal findings in urine: Secondary | ICD-10-CM | POA: Diagnosis not present

## 2018-08-21 ENCOUNTER — Other Ambulatory Visit: Payer: Self-pay | Admitting: Family Medicine

## 2018-08-21 DIAGNOSIS — R1084 Generalized abdominal pain: Secondary | ICD-10-CM

## 2018-08-22 DIAGNOSIS — M542 Cervicalgia: Secondary | ICD-10-CM | POA: Diagnosis not present

## 2018-08-22 DIAGNOSIS — M545 Low back pain: Secondary | ICD-10-CM | POA: Diagnosis not present

## 2018-08-22 DIAGNOSIS — R293 Abnormal posture: Secondary | ICD-10-CM | POA: Diagnosis not present

## 2018-08-22 DIAGNOSIS — M2560 Stiffness of unspecified joint, not elsewhere classified: Secondary | ICD-10-CM | POA: Diagnosis not present

## 2018-08-25 DIAGNOSIS — D649 Anemia, unspecified: Secondary | ICD-10-CM | POA: Diagnosis not present

## 2018-08-25 DIAGNOSIS — F39 Unspecified mood [affective] disorder: Secondary | ICD-10-CM | POA: Diagnosis not present

## 2018-08-25 DIAGNOSIS — F424 Excoriation (skin-picking) disorder: Secondary | ICD-10-CM | POA: Diagnosis not present

## 2018-08-25 DIAGNOSIS — G8929 Other chronic pain: Secondary | ICD-10-CM | POA: Diagnosis not present

## 2018-08-28 DIAGNOSIS — R109 Unspecified abdominal pain: Secondary | ICD-10-CM | POA: Diagnosis not present

## 2018-08-28 DIAGNOSIS — R110 Nausea: Secondary | ICD-10-CM | POA: Diagnosis not present

## 2018-08-28 DIAGNOSIS — T50905A Adverse effect of unspecified drugs, medicaments and biological substances, initial encounter: Secondary | ICD-10-CM | POA: Diagnosis not present

## 2018-08-28 DIAGNOSIS — F515 Nightmare disorder: Secondary | ICD-10-CM | POA: Diagnosis not present

## 2018-08-29 ENCOUNTER — Ambulatory Visit
Admission: RE | Admit: 2018-08-29 | Discharge: 2018-08-29 | Disposition: A | Discharge status: Home / Self Care | Payer: Medicare Other | Source: Ambulatory Visit | Attending: Family Medicine | Admitting: Family Medicine

## 2018-08-29 DIAGNOSIS — R1084 Generalized abdominal pain: Secondary | ICD-10-CM

## 2018-08-29 DIAGNOSIS — M542 Cervicalgia: Secondary | ICD-10-CM | POA: Diagnosis not present

## 2018-08-29 DIAGNOSIS — M2560 Stiffness of unspecified joint, not elsewhere classified: Secondary | ICD-10-CM | POA: Diagnosis not present

## 2018-08-29 DIAGNOSIS — M545 Low back pain: Secondary | ICD-10-CM | POA: Diagnosis not present

## 2018-08-29 DIAGNOSIS — R293 Abnormal posture: Secondary | ICD-10-CM | POA: Diagnosis not present

## 2018-09-01 DIAGNOSIS — M545 Low back pain: Secondary | ICD-10-CM | POA: Diagnosis not present

## 2018-09-01 DIAGNOSIS — M2560 Stiffness of unspecified joint, not elsewhere classified: Secondary | ICD-10-CM | POA: Diagnosis not present

## 2018-09-01 DIAGNOSIS — R293 Abnormal posture: Secondary | ICD-10-CM | POA: Diagnosis not present

## 2018-09-01 DIAGNOSIS — M542 Cervicalgia: Secondary | ICD-10-CM | POA: Diagnosis not present

## 2018-09-02 ENCOUNTER — Other Ambulatory Visit: Payer: Self-pay | Admitting: Family Medicine

## 2018-09-02 ENCOUNTER — Ambulatory Visit
Admission: RE | Admit: 2018-09-02 | Discharge: 2018-09-02 | Disposition: A | Discharge status: Home / Self Care | Payer: Medicare Other | Source: Ambulatory Visit | Attending: Family Medicine | Admitting: Family Medicine

## 2018-09-02 DIAGNOSIS — R52 Pain, unspecified: Secondary | ICD-10-CM

## 2018-09-02 DIAGNOSIS — M542 Cervicalgia: Secondary | ICD-10-CM | POA: Diagnosis not present

## 2018-09-02 DIAGNOSIS — R109 Unspecified abdominal pain: Secondary | ICD-10-CM | POA: Diagnosis not present

## 2018-09-03 DIAGNOSIS — M2560 Stiffness of unspecified joint, not elsewhere classified: Secondary | ICD-10-CM | POA: Diagnosis not present

## 2018-09-03 DIAGNOSIS — M545 Low back pain: Secondary | ICD-10-CM | POA: Diagnosis not present

## 2018-09-03 DIAGNOSIS — R293 Abnormal posture: Secondary | ICD-10-CM | POA: Diagnosis not present

## 2018-09-03 DIAGNOSIS — M542 Cervicalgia: Secondary | ICD-10-CM | POA: Diagnosis not present

## 2018-09-08 DIAGNOSIS — R293 Abnormal posture: Secondary | ICD-10-CM | POA: Diagnosis not present

## 2018-09-08 DIAGNOSIS — M48061 Spinal stenosis, lumbar region without neurogenic claudication: Secondary | ICD-10-CM | POA: Diagnosis not present

## 2018-09-08 DIAGNOSIS — M545 Low back pain: Secondary | ICD-10-CM | POA: Diagnosis not present

## 2018-09-08 DIAGNOSIS — M542 Cervicalgia: Secondary | ICD-10-CM | POA: Diagnosis not present

## 2018-09-08 DIAGNOSIS — M2560 Stiffness of unspecified joint, not elsewhere classified: Secondary | ICD-10-CM | POA: Diagnosis not present

## 2018-09-10 DIAGNOSIS — M2560 Stiffness of unspecified joint, not elsewhere classified: Secondary | ICD-10-CM | POA: Diagnosis not present

## 2018-09-10 DIAGNOSIS — R293 Abnormal posture: Secondary | ICD-10-CM | POA: Diagnosis not present

## 2018-09-10 DIAGNOSIS — M542 Cervicalgia: Secondary | ICD-10-CM | POA: Diagnosis not present

## 2018-09-10 DIAGNOSIS — M545 Low back pain: Secondary | ICD-10-CM | POA: Diagnosis not present

## 2018-09-12 DIAGNOSIS — M2560 Stiffness of unspecified joint, not elsewhere classified: Secondary | ICD-10-CM | POA: Diagnosis not present

## 2018-09-12 DIAGNOSIS — R293 Abnormal posture: Secondary | ICD-10-CM | POA: Diagnosis not present

## 2018-09-12 DIAGNOSIS — M542 Cervicalgia: Secondary | ICD-10-CM | POA: Diagnosis not present

## 2018-09-12 DIAGNOSIS — M545 Low back pain: Secondary | ICD-10-CM | POA: Diagnosis not present

## 2018-09-17 DIAGNOSIS — Z79899 Other long term (current) drug therapy: Secondary | ICD-10-CM | POA: Diagnosis not present

## 2018-09-17 DIAGNOSIS — M1990 Unspecified osteoarthritis, unspecified site: Secondary | ICD-10-CM | POA: Diagnosis not present

## 2018-09-17 DIAGNOSIS — M5090 Cervical disc disorder, unspecified, unspecified cervical region: Secondary | ICD-10-CM | POA: Diagnosis not present

## 2018-09-17 DIAGNOSIS — G894 Chronic pain syndrome: Secondary | ICD-10-CM | POA: Diagnosis not present

## 2018-09-18 ENCOUNTER — Emergency Department (HOSPITAL_COMMUNITY): Payer: Medicare Other

## 2018-09-18 ENCOUNTER — Other Ambulatory Visit: Payer: Self-pay

## 2018-09-18 ENCOUNTER — Inpatient Hospital Stay (HOSPITAL_COMMUNITY)
Admission: EM | Admit: 2018-09-18 | Discharge: 2018-09-20 | DRG: 292 | Disposition: A | Discharge status: Home / Self Care | Payer: Medicare Other | Attending: Cardiology | Admitting: Cardiology

## 2018-09-18 ENCOUNTER — Encounter (HOSPITAL_COMMUNITY): Payer: Self-pay

## 2018-09-18 DIAGNOSIS — I509 Heart failure, unspecified: Secondary | ICD-10-CM | POA: Diagnosis not present

## 2018-09-18 DIAGNOSIS — M19019 Primary osteoarthritis, unspecified shoulder: Secondary | ICD-10-CM | POA: Diagnosis present

## 2018-09-18 DIAGNOSIS — I4821 Permanent atrial fibrillation: Secondary | ICD-10-CM | POA: Diagnosis present

## 2018-09-18 DIAGNOSIS — I493 Ventricular premature depolarization: Secondary | ICD-10-CM | POA: Diagnosis present

## 2018-09-18 DIAGNOSIS — Z87891 Personal history of nicotine dependence: Secondary | ICD-10-CM

## 2018-09-18 DIAGNOSIS — D509 Iron deficiency anemia, unspecified: Secondary | ICD-10-CM | POA: Diagnosis present

## 2018-09-18 DIAGNOSIS — M48061 Spinal stenosis, lumbar region without neurogenic claudication: Secondary | ICD-10-CM | POA: Diagnosis not present

## 2018-09-18 DIAGNOSIS — Q211 Atrial septal defect: Secondary | ICD-10-CM | POA: Diagnosis not present

## 2018-09-18 DIAGNOSIS — R7989 Other specified abnormal findings of blood chemistry: Secondary | ICD-10-CM | POA: Diagnosis present

## 2018-09-18 DIAGNOSIS — M4802 Spinal stenosis, cervical region: Secondary | ICD-10-CM | POA: Diagnosis present

## 2018-09-18 DIAGNOSIS — I110 Hypertensive heart disease with heart failure: Principal | ICD-10-CM | POA: Diagnosis present

## 2018-09-18 DIAGNOSIS — G2581 Restless legs syndrome: Secondary | ICD-10-CM | POA: Diagnosis present

## 2018-09-18 DIAGNOSIS — R0602 Shortness of breath: Secondary | ICD-10-CM | POA: Diagnosis not present

## 2018-09-18 DIAGNOSIS — I4892 Unspecified atrial flutter: Secondary | ICD-10-CM | POA: Diagnosis present

## 2018-09-18 DIAGNOSIS — Z953 Presence of xenogenic heart valve: Secondary | ICD-10-CM

## 2018-09-18 DIAGNOSIS — Z8673 Personal history of transient ischemic attack (TIA), and cerebral infarction without residual deficits: Secondary | ICD-10-CM

## 2018-09-18 DIAGNOSIS — Z888 Allergy status to other drugs, medicaments and biological substances status: Secondary | ICD-10-CM

## 2018-09-18 DIAGNOSIS — G894 Chronic pain syndrome: Secondary | ICD-10-CM | POA: Diagnosis present

## 2018-09-18 DIAGNOSIS — I272 Pulmonary hypertension, unspecified: Secondary | ICD-10-CM | POA: Diagnosis present

## 2018-09-18 DIAGNOSIS — Z7901 Long term (current) use of anticoagulants: Secondary | ICD-10-CM

## 2018-09-18 DIAGNOSIS — Z6838 Body mass index (BMI) 38.0-38.9, adult: Secondary | ICD-10-CM

## 2018-09-18 DIAGNOSIS — M542 Cervicalgia: Secondary | ICD-10-CM | POA: Diagnosis present

## 2018-09-18 DIAGNOSIS — I2510 Atherosclerotic heart disease of native coronary artery without angina pectoris: Secondary | ICD-10-CM | POA: Diagnosis present

## 2018-09-18 DIAGNOSIS — R1314 Dysphagia, pharyngoesophageal phase: Secondary | ICD-10-CM | POA: Diagnosis present

## 2018-09-18 DIAGNOSIS — E785 Hyperlipidemia, unspecified: Secondary | ICD-10-CM | POA: Diagnosis present

## 2018-09-18 DIAGNOSIS — H409 Unspecified glaucoma: Secondary | ICD-10-CM | POA: Diagnosis present

## 2018-09-18 DIAGNOSIS — J329 Chronic sinusitis, unspecified: Secondary | ICD-10-CM | POA: Diagnosis present

## 2018-09-18 DIAGNOSIS — R0902 Hypoxemia: Secondary | ICD-10-CM | POA: Diagnosis not present

## 2018-09-18 DIAGNOSIS — I350 Nonrheumatic aortic (valve) stenosis: Secondary | ICD-10-CM | POA: Diagnosis present

## 2018-09-18 DIAGNOSIS — I4891 Unspecified atrial fibrillation: Secondary | ICD-10-CM | POA: Diagnosis not present

## 2018-09-18 DIAGNOSIS — R079 Chest pain, unspecified: Secondary | ICD-10-CM | POA: Diagnosis present

## 2018-09-18 DIAGNOSIS — I5033 Acute on chronic diastolic (congestive) heart failure: Secondary | ICD-10-CM | POA: Diagnosis present

## 2018-09-18 DIAGNOSIS — R791 Abnormal coagulation profile: Secondary | ICD-10-CM | POA: Diagnosis present

## 2018-09-18 DIAGNOSIS — J312 Chronic pharyngitis: Secondary | ICD-10-CM | POA: Diagnosis present

## 2018-09-18 DIAGNOSIS — I499 Cardiac arrhythmia, unspecified: Secondary | ICD-10-CM | POA: Diagnosis not present

## 2018-09-18 DIAGNOSIS — R0600 Dyspnea, unspecified: Secondary | ICD-10-CM

## 2018-09-18 DIAGNOSIS — E669 Obesity, unspecified: Secondary | ICD-10-CM | POA: Diagnosis present

## 2018-09-18 DIAGNOSIS — R000 Tachycardia, unspecified: Secondary | ICD-10-CM | POA: Diagnosis not present

## 2018-09-18 DIAGNOSIS — M5134 Other intervertebral disc degeneration, thoracic region: Secondary | ICD-10-CM | POA: Diagnosis present

## 2018-09-18 DIAGNOSIS — I200 Unstable angina: Secondary | ICD-10-CM | POA: Diagnosis not present

## 2018-09-18 DIAGNOSIS — M549 Dorsalgia, unspecified: Secondary | ICD-10-CM | POA: Diagnosis present

## 2018-09-18 DIAGNOSIS — Z952 Presence of prosthetic heart valve: Secondary | ICD-10-CM

## 2018-09-18 DIAGNOSIS — Z79899 Other long term (current) drug therapy: Secondary | ICD-10-CM

## 2018-09-18 DIAGNOSIS — Z981 Arthrodesis status: Secondary | ICD-10-CM

## 2018-09-18 DIAGNOSIS — I10 Essential (primary) hypertension: Secondary | ICD-10-CM | POA: Diagnosis not present

## 2018-09-18 DIAGNOSIS — Z9114 Patient's other noncompliance with medication regimen: Secondary | ICD-10-CM

## 2018-09-18 DIAGNOSIS — F329 Major depressive disorder, single episode, unspecified: Secondary | ICD-10-CM | POA: Diagnosis present

## 2018-09-18 DIAGNOSIS — M5416 Radiculopathy, lumbar region: Secondary | ICD-10-CM | POA: Diagnosis not present

## 2018-09-18 DIAGNOSIS — Z7982 Long term (current) use of aspirin: Secondary | ICD-10-CM

## 2018-09-18 DIAGNOSIS — M4716 Other spondylosis with myelopathy, lumbar region: Secondary | ICD-10-CM | POA: Diagnosis present

## 2018-09-18 DIAGNOSIS — G5600 Carpal tunnel syndrome, unspecified upper limb: Secondary | ICD-10-CM | POA: Diagnosis present

## 2018-09-18 DIAGNOSIS — Z96653 Presence of artificial knee joint, bilateral: Secondary | ICD-10-CM | POA: Diagnosis present

## 2018-09-18 HISTORY — DX: Permanent atrial fibrillation: I48.21

## 2018-09-18 HISTORY — DX: Atrial septal defect: Q21.1

## 2018-09-18 HISTORY — DX: Nonrheumatic aortic (valve) stenosis: I35.0

## 2018-09-18 HISTORY — DX: Other specified postprocedural states: Z98.890

## 2018-09-18 HISTORY — DX: Patent foramen ovale: Q21.12

## 2018-09-18 LAB — BASIC METABOLIC PANEL
Anion gap: 8 (ref 5–15)
BUN: 26 mg/dL — AB (ref 8–23)
CALCIUM: 9.1 mg/dL (ref 8.9–10.3)
CO2: 27 mmol/L (ref 22–32)
CREATININE: 1.15 mg/dL (ref 0.61–1.24)
Chloride: 102 mmol/L (ref 98–111)
GFR calc Af Amer: >60 mL/min (ref >60)
GFR, EST NON AFRICAN AMERICAN: 60 mL/min — AB (ref >60)
GLUCOSE: 130 mg/dL — AB (ref 70–99)
POTASSIUM: 4.5 mmol/L (ref 3.5–5.1)
SODIUM: 137 mmol/L (ref 135–145)

## 2018-09-18 LAB — CBC
HCT: 37.8 % — ABNORMAL LOW (ref 39.0–52.0)
HEMOGLOBIN: 11.1 g/dL — AB (ref 13.0–17.0)
MCH: 23.9 pg — AB (ref 26.0–34.0)
MCHC: 29.4 g/dL — AB (ref 30.0–36.0)
MCV: 81.3 fL (ref 78.0–100.0)
Platelets: 231 10*3/uL (ref 150–400)
RBC: 4.65 MIL/uL (ref 4.22–5.81)
RDW: 20.5 % — AB (ref 11.5–15.5)
WBC: 8 10*3/uL (ref 4.0–10.5)

## 2018-09-18 LAB — TROPONIN I
Troponin I: <0.03 ng/mL (ref <0.03)
Troponin I: <0.03 ng/mL (ref <0.03)

## 2018-09-18 LAB — MAGNESIUM: MAGNESIUM: 2 mg/dL (ref 1.7–2.4)

## 2018-09-18 LAB — BRAIN NATRIURETIC PEPTIDE: B NATRIURETIC PEPTIDE 5: 705.9 pg/mL — AB (ref 0.0–100.0)

## 2018-09-18 LAB — D-DIMER, QUANTITATIVE (NOT AT ARMC): D DIMER QUANT: 0.71 ug{FEU}/mL — AB (ref 0.00–0.50)

## 2018-09-18 MED ORDER — BUPROPION HCL ER (SR) 150 MG PO TB12
150.0000 mg | ORAL_TABLET | Freq: Two times a day (BID) | ORAL | Status: DC
  Administered 2018-09-18 – 2018-09-20 (×4): 150 mg via ORAL
  Filled 2018-09-18 (×4): qty 1

## 2018-09-18 MED ORDER — OXYCODONE HCL 5 MG PO TABS
5.0000 mg | ORAL_TABLET | Freq: Four times a day (QID) | ORAL | Status: DC | PRN
  Administered 2018-09-19 – 2018-09-20 (×5): 5 mg via ORAL
  Filled 2018-09-18 (×5): qty 1

## 2018-09-18 MED ORDER — ONDANSETRON HCL 4 MG PO TABS: 8.0000 mg | ORAL_TABLET | Freq: Three times a day (TID) | ORAL | Status: DC | PRN

## 2018-09-18 MED ORDER — LISINOPRIL 20 MG PO TABS
30.0000 mg | ORAL_TABLET | Freq: Every day | ORAL | Status: DC
  Administered 2018-09-19 – 2018-09-20 (×2): 30 mg via ORAL
  Filled 2018-09-18 (×2): qty 1

## 2018-09-18 MED ORDER — OXYCODONE-ACETAMINOPHEN 5-325 MG PO TABS
1.0000 | ORAL_TABLET | Freq: Four times a day (QID) | ORAL | Status: DC | PRN
  Administered 2018-09-19 – 2018-09-20 (×5): 1 via ORAL
  Filled 2018-09-18 (×5): qty 1

## 2018-09-18 MED ORDER — FUROSEMIDE 10 MG/ML IJ SOLN
40.0000 mg | Freq: Two times a day (BID) | INTRAMUSCULAR | Status: DC
  Administered 2018-09-19 – 2018-09-20 (×3): 40 mg via INTRAVENOUS
  Filled 2018-09-18 (×3): qty 4

## 2018-09-18 MED ORDER — FLUTICASONE PROPIONATE 50 MCG/ACT NA SUSP
1.0000 | Freq: Every day | NASAL | Status: DC
  Administered 2018-09-18 – 2018-09-20 (×3): 1 via NASAL
  Filled 2018-09-18: qty 16

## 2018-09-18 MED ORDER — METOPROLOL SUCCINATE ER 25 MG PO TB24
25.0000 mg | ORAL_TABLET | Freq: Two times a day (BID) | ORAL | Status: DC
  Administered 2018-09-18 – 2018-09-20 (×4): 25 mg via ORAL
  Filled 2018-09-18 (×4): qty 1

## 2018-09-18 MED ORDER — METHOCARBAMOL 500 MG PO TABS
500.0000 mg | ORAL_TABLET | Freq: Two times a day (BID) | ORAL | Status: DC
  Administered 2018-09-19 – 2018-09-20 (×3): 500 mg via ORAL
  Filled 2018-09-18 (×4): qty 1

## 2018-09-18 MED ORDER — TOPIRAMATE 25 MG PO CPSP: 25.0000 mg | ORAL_CAPSULE | Freq: Two times a day (BID) | ORAL | Status: DC

## 2018-09-18 MED ORDER — SIMVASTATIN 40 MG PO TABS
40.0000 mg | ORAL_TABLET | Freq: Every day | ORAL | Status: DC
  Administered 2018-09-18 – 2018-09-19 (×2): 40 mg via ORAL
  Filled 2018-09-18 (×2): qty 1

## 2018-09-18 MED ORDER — ROPINIROLE HCL 0.5 MG PO TABS
0.5000 mg | ORAL_TABLET | Freq: Two times a day (BID) | ORAL | Status: DC
  Administered 2018-09-18 – 2018-09-20 (×4): 0.5 mg via ORAL
  Filled 2018-09-18 (×5): qty 1

## 2018-09-18 MED ORDER — APIXABAN 5 MG PO TABS
5.0000 mg | ORAL_TABLET | Freq: Two times a day (BID) | ORAL | Status: DC
  Administered 2018-09-18 – 2018-09-20 (×4): 5 mg via ORAL
  Filled 2018-09-18 (×4): qty 1

## 2018-09-18 MED ORDER — SODIUM CHLORIDE 0.9 % IV SOLN: 250.0000 mL | INTRAVENOUS | Status: DC | PRN

## 2018-09-18 MED ORDER — SODIUM CHLORIDE 0.9% FLUSH: 3.0000 mL | INTRAVENOUS | Status: DC | PRN

## 2018-09-18 MED ORDER — GABAPENTIN 300 MG PO CAPS
300.0000 mg | ORAL_CAPSULE | Freq: Three times a day (TID) | ORAL | Status: DC
  Administered 2018-09-18 – 2018-09-20 (×5): 300 mg via ORAL
  Filled 2018-09-18 (×5): qty 1

## 2018-09-18 MED ORDER — SODIUM CHLORIDE 0.9% FLUSH
3.0000 mL | Freq: Two times a day (BID) | INTRAVENOUS | Status: DC
  Administered 2018-09-18 – 2018-09-20 (×3): 3 mL via INTRAVENOUS

## 2018-09-18 MED ORDER — OXYCODONE-ACETAMINOPHEN 10-325 MG PO TABS: 1.0000 | ORAL_TABLET | Freq: Four times a day (QID) | ORAL | Status: DC | PRN

## 2018-09-18 MED ORDER — POLYETHYLENE GLYCOL 3350 17 G PO PACK
17.0000 g | PACK | Freq: Every day | ORAL | Status: DC
  Administered 2018-09-19 – 2018-09-20 (×2): 17 g via ORAL
  Filled 2018-09-18 (×2): qty 1

## 2018-09-18 NOTE — ED Provider Notes (Addendum)
MSE was initiated and I personally evaluated the patient and placed orders (if any) at  2:41 PM on September 18, 2018.  The patient appears stable so that the remainder of the MSE may be completed by another provider.\   [name] is a 77 y.o. M who presents with cc of SOB.  He is status post TAVR at [facility] back in July.  Patient states that over the past 2 days he has had worsening exertional dyspnea and PND.  He went to his PCPs office he found that he was in rapid heart rate and potential A. fib.  He has no history of such and was sent to the emergency department.  Patient is on Eliquis by review of EMR at [facility].  He is also on aspirin.  He denies chest pain or peripheral edema that is new.    On exam patient is easily winded with exertion including sitting up to listen to his lungs. Crackles in the bilateral lung bases. Rhythm seems regular with interval PVC or PAC interruptions. Bilateral pitting peripheral edema.  Margarita Mail, PA-C 09/18/18 1504    Margarita Mail, PA-C 09/18/18 1504    Little, Wenda Overland, MD 09/19/18 [phone]

## 2018-09-18 NOTE — ED Notes (Signed)
Pt complaining of chest pain.

## 2018-09-18 NOTE — ED Provider Notes (Signed)
[facility] EMERGENCY DEPARTMENT Provider Note   CSN: [id number] Arrival date & time: 09/18/18  1424     History   Chief Complaint Chief Complaint  Patient presents with  . Shortness of Breath  pt Is a vague historian  HPI [name] is a 77 y.o. male.  HPI Planes of shortness of breath gradual onset 3 or 4 days ago.  Shortness of breath is worse with sitting up and with minimal exertion improved with lying supine.  He states he had a burning in his chest 2 days ago which resolved spontaneously.  He denies cough denies fever.  No pain presently.  Brought by EMS.  He was seen by his primary care physician Dr. Doristine Section this morning.  Treated with aspirin at her office.  EMS treated patient with supplemental oxygen. Past Medical History:  Diagnosis Date  . Abdominal pain, generalized   . Abrasion or friction burn of other, multiple, and unspecified sites, without mention of infection   . Acute pain due to trauma   . Aortic valve disease    mild-mod AS by 01/2016 TTE, restricted AV opening with mod AR 01/2016 TEE  . Backache, unspecified   . Cancer ([place])   . Carpal tunnel syndrome   . Cervical spondylosis with myelopathy   . Cervical spondylosis without myelopathy   . Cervicalgia   . Chronic neck pain   . Chronic pain syndrome   . Chronic pharyngitis   . Degeneration of thoracic or thoracolumbar intervertebral disc   . Depression   . Depressive disorder, not elsewhere classified   . Difficult intubation    needs smaller tube; awake oral fiberoptic scope 8.0 ETT 03/30/08  . Dysphagia, pharyngeal phase   . Dysuria   . Encounter for removal of sutures 01/07/2013  . Hemorrhage of gastrointestinal tract, unspecified   . Impacted cerumen 02/26/2013  . Iron deficiency anemia, unspecified   . Lumbago   . Lumbosacral spondylosis without myelopathy   . Lumbosacral spondylosis without myelopathy   . Muscle weakness (generalized)   . Nonspecific abnormal  results of pulmonary system function study   . Otalgia, unspecified   . Other and unspecified disc disorder of cervical region   . Other and unspecified hyperlipidemia   . Other and unspecified open wound of head without mention of complication   . Other diseases of vocal cords   . Other dysphagia   . Other malaise and fatigue   . Pain in joint, shoulder region   . PFO (patent foramen ovale)    PFO present per 02/14/16 TEE report [facility])  . Primary localized osteoarthrosis, lower leg   . Primary localized osteoarthrosis, shoulder region   . Reflux esophagitis   . Retention of urine, unspecified   . Spinal stenosis, unspecified region other than cervical   . Stroke ([place])    01/2016  . Syncope and collapse   . Throat pain   . Unspecified arthropathy, lower leg   . Unspecified constipation   . Unspecified essential hypertension   . Unspecified glaucoma(365.9)   . Unspecified sinusitis (chronic)     Patient Active Problem List   Diagnosis Date Noted  . Heme positive stool 11/20/2017  . Gait disorder 03/24/2013  . Cervical post-laminectomy syndrome 03/26/2012  . Depression 03/26/2012  . Degenerative arthritis of shoulder region 03/26/2012  . KNEE REPLACEMENT, BILATERAL, HX OF 06/04/2007  . CELLULITIS/ABSCESS, ARM 05/05/2007  . ANXIETY 04/08/2007  . PAIN, CHRONIC NEC 04/08/2007  .  OSTEOARTHROSIS, GENERALIZED, MULTIPLE SITES 04/08/2007  . SPONDYLOLISTHESIS 04/08/2007  . INCONTINENCE, URGE 04/08/2007  . HYPERCHOLESTEROLEMIA 03/07/2007  . HYPERTENSION, BENIGN 03/07/2007    Past Surgical History:  Procedure Laterality Date  . BILATERAL CATARACT SURGERY  2009   DR GROAT   . CARDIAC VALVE REPLACEMENT  05/2018  . CERVIACAL SPINE (4X)     DR MARK ROY   . COLONOSCOPY  2007   DR HENSEL   . COLONOSCOPY WITH PROPOFOL N/A 11/20/2017   Procedure: COLONOSCOPY WITH PROPOFOL;  Surgeon: Wilford Corner, MD;  Location: [place];  Service: Endoscopy;  Laterality: N/A;  . JOINT  REPLACEMENT     both knees  . LEFT KNEE REPLACEMENT     DR Maureen Ralphs  . LEFT TRANSVERSE CARPAL LIGAMENT  01/06/2008  . ROTATOR CUFF LEFT SHOULDER  2001   DR MURPHY   . SHOULDER OPEN ROTATOR CUFF REPAIR  2006   DR [facility]  . SPINE SURGERY     neck fusion        Home Medications    Prior to Admission medications   Medication Sig Start Date End Date Taking? Authorizing Provider  aspirin EC 81 MG tablet Take 81 mg by mouth daily.   Yes [provider]  buPROPion (WELLBUTRIN SR) 150 MG 12 hr tablet Take 150 mg by mouth 2 (two) times daily.    Yes [provider]  COLLAGEN PO Take 6 capsules by mouth daily.   Yes [provider]  furosemide (LASIX) 20 MG tablet Take 20 mg by mouth daily.    Yes [provider]  lisinopril (PRINIVIL,ZESTRIL) 20 MG tablet Take 20 mg by mouth daily.   Yes [provider]  Misc Natural Products (BETA-SITOSTEROL PLANT STEROLS) CAPS Take 1 capsule by mouth daily.   Yes [provider]  Omega-3 Fatty Acids (OMEGA 3 PO) Take 1 capsule by mouth 2 (two) times daily.   Yes [provider]  OVER THE COUNTER MEDICATION Nitro beets otc supplement - 1 rounded teaspoon once daily   Yes [provider]  polyethylene glycol (MIRALAX / GLYCOLAX) packet Take 17 g by mouth daily.   Yes [provider]  rOPINIRole (REQUIP) 0.5 MG tablet Take 0.5 mg by mouth 2 (two) times daily.   Yes [provider]  simvastatin (ZOCOR) 40 MG tablet Take 40 mg by mouth at bedtime.   Yes [provider]  TURMERIC PO Take 1-2 tablets by mouth daily.   Yes [provider]  ELIQUIS 5 MG TABS tablet Take 5 mg by mouth 2 (two) times daily. 07/13/18   [provider]  gabapentin (NEURONTIN) 300 MG capsule Take 300 mg by mouth 3 (three) times daily. 08/10/18   [provider]  methocarbamol (ROBAXIN) 500 MG tablet Take 500 mg by mouth 2 (two) times daily. 08/19/18   [provider]  omeprazole (PRILOSEC) 20 MG capsule TAKE 2 CAPSULES DAILY TO REDUCE STOMACH ACID AND HELP HEARTBURN Patient not taking: Reported on 09/18/2018 05/07/13   [name], MD  ondansetron (ZOFRAN) 8 MG tablet Take 8 mg by mouth every 8 (eight) hours as needed. for nausea 08/19/18   [provider]  oxyCODONE-acetaminophen (PERCOCET) 10-325 MG tablet Take 1 tablet by mouth every 6 (six) hours as needed for pain.    [provider]  topiramate (TOPAMAX) 25 MG capsule Take 25 mg by mouth 2 (two) times daily.     [provider]    Family History Family  History  Problem Relation Age of Onset  . Cancer Mother 30       cervical cancer  . Cancer Father 50       lung  cancer    Social History Social History   Tobacco Use  . Smoking status: Former Smoker    Last attempt to quit: 08/29/1985    Years since quitting: 33.0  . Smokeless tobacco: Former Systems developer    Quit date: 02/12/1985  Substance Use Topics  . Alcohol use: No  . Drug use: No     Allergies   Duloxetine; Pregabalin; and Zanaflex [tizanidine hcl]   Review of Systems Review of Systems  Constitutional: Negative.   HENT: Negative.   Respiratory: Positive for shortness of breath.   Cardiovascular: Positive for chest pain and leg swelling.       Bilateral leg edema, chronic  Gastrointestinal: Negative.        Denies rectal bleeding or blood per stool or black stools  Musculoskeletal: Negative.   Skin: Negative.   Allergic/Immunologic: Negative.   Neurological: Negative.   Psychiatric/Behavioral: Negative.   All other systems reviewed and are negative.    Physical Exam Updated Vital Signs BP (!) 153/81   Pulse (!) 107   Temp 97.9 F (36.6 C) (Oral)   Resp 14   SpO2 98%   Physical Exam  Constitutional: No distress.  Chronically ill-appearing  HENT:  Head: Normocephalic and atraumatic.  Eyes: Pupils are equal, round, and reactive to light. Conjunctivae are normal.  Neck:  Neck supple. No JVD present. No tracheal deviation present. No thyromegaly present.  Posterior surgical scar  Cardiovascular: Regular rhythm.  No murmur heard. Tachycardic  Pulmonary/Chest: Effort normal.  Rales at bases bilaterally  Abdominal: Soft. Bowel sounds are normal. He exhibits no distension. There is no tenderness.  Obese  Musculoskeletal: Normal range of motion. He exhibits edema. He exhibits no tenderness.  1+ pretibial pitting edema bilaterally  Neurological: He is alert. Coordination normal.  Skin: Skin is warm and dry. No rash noted.  Psychiatric: He has a normal mood and affect.  Nursing note and vitals reviewed.    ED Treatments / Results  Labs (all labs ordered are listed, but only abnormal results are displayed) Labs Reviewed  CBC - Abnormal; Notable for the following components:      Result Value   Hemoglobin 11.1 (*)    HCT 37.8 (*)    MCH 23.9 (*)    MCHC 29.4 (*)    RDW 20.5 (*)    All other components within normal limits  BASIC METABOLIC PANEL  MAGNESIUM  BRAIN NATRIURETIC PEPTIDE  TROPONIN I  D-DIMER, QUANTITATIVE (NOT AT [facility])    EKG EKG Interpretation  Date/Time:  Thursday September 18 2018 14:29:08 EDT Ventricular Rate:  121 PR Interval:    QRS Duration: 81 QT Interval:  333 QTC Calculation: 473 R Axis:   -7 Text Interpretation:  atrial flutter Ventricular premature complex Anterior infarct, old SINCE LAST TRACING HEART RATE HAS INCREASED Confirmed by Orlie Dakin [phone]) on 09/18/2018 3:02:40 PM Also confirmed by Orlie Dakin [phone]), editor Philomena Doheny [phone])  on 09/18/2018 3:18:33 PM   Radiology No results found.  Procedures Procedures (including critical care time)  Medications Ordered in ED Medications - No data to display   Initial Impression / Assessment and Plan / ED Course  I have reviewed the triage vital signs and the nursing notes.  Pertinent labs & imaging results that were available during my care of  the  patient were reviewed by me and considered in my medical decision making (see chart for details).     Of note pharmacy tech obtained history the patient has not had Eliquis filled since July 30 and is not likely taking it   Nasal prong oxygen discontinued by me.  At 3:40 PM pulse oximetry on room air 95%, normal patient states he is breathing normally   Chest x-ray viewed by me   Results for orders placed or performed during the hospital encounter of 77/41/28  Basic metabolic panel  Result Value Ref Range   Sodium 137 135 - 145 mmol/L   Potassium 4.5 3.5 - 5.1 mmol/L   Chloride 102 98 - 111 mmol/L   CO2 27 22 - 32 mmol/L   Glucose, Bld 130 (H) 70 - 99 mg/dL   BUN 26 (H) 8 - 23 mg/dL   Creatinine, Ser 1.15 0.61 - 1.24 mg/dL   Calcium 9.1 8.9 - 10.3 mg/dL   GFR calc non Af Amer 60 (L) >60 mL/min   GFR calc Af Amer >60 >60 mL/min   Anion gap 8 5 - 15  Magnesium  Result Value Ref Range   Magnesium 2.0 1.7 - 2.4 mg/dL  CBC  Result Value Ref Range   WBC 8.0 4.0 - 10.5 K/uL   RBC 4.65 4.22 - 5.81 MIL/uL   Hemoglobin 11.1 (L) 13.0 - 17.0 g/dL   HCT 37.8 (L) 39.0 - 52.0 %   MCV 81.3 78.0 - 100.0 fL   MCH 23.9 (L) 26.0 - 34.0 pg   MCHC 29.4 (L) 30.0 - 36.0 g/dL   RDW 20.5 (H) 11.5 - 15.5 %   Platelets 231 150 - 400 K/uL  Brain natriuretic peptide  Result Value Ref Range   B Natriuretic Peptide 705.9 (H) 0.0 - 100.0 pg/mL  D-dimer, quantitative (not at [facility])  Result Value Ref Range   D-Dimer, Quant 0.71 (H) 0.00 - 0.50 ug/mL-FEU   Dg Cervical Spine Complete  Result Date: 09/03/2018 CLINICAL DATA:  C/o ROM in extension more limited after falling 1 month prior / neck pain / concern for hardware stability or FX / jdh [id number] EXAM: CERVICAL SPINE - COMPLETE 4+ VIEW COMPARISON:  05/29/2018 FINDINGS: Craniocervical fusion noted with posterolateral facet screw and rod fixation at C3-4 extending up to the occiput; and separate posterolateral rod and screw fixation extending at C4-C5-C6-C7,  and with anterior plate and screw fixator at C6-7. Solid interbody and facet fusion at these levels, with flowing bony fusion extending up to the occiput along the posterior elements. As before, the spinous fused with 4 mm of anterolisthesis at C2-3. No new subluxation is identified. The degree of spurring and fusion makes assessment of the neural foramina difficult, and blurs intervertebral margins. There is quite likely narrowing of the neural foramina at C4-5, C5-6, and C6-7 although cross-sectional imaging would probably depict this better. No acute cervical spine findings. The odontoid and lateral masses of C1 are not well shown on the attempted odontoid view. The patient was not able to significantly open his mouth and the odontoid is obscured by the patient's hardware. IMPRESSION: 1. Similar appearance of fusion of all levels in the cervical spine and extending to the occiput. The spinous fused with 4 mm of anterolisthesis at C2-3 which is unchanged. No appreciable hardware failure. 2. I cannot exclude multilevel foraminal narrowing including C4-5, C5-6, and C6-7. 3. The odontoid is not well seen on the dedicated odontoid views, this is multifactorial. Electronically  Signed   By: Van Clines M.D.   On: 09/03/2018 08:15   US Abdomen Complete  Result Date: 08/29/2018 CLINICAL DATA:  Generalized abdominal pain and distension. EXAM: ABDOMEN ULTRASOUND COMPLETE COMPARISON:  Abdomen and pelvis CT dated 03/26/2008. FINDINGS: Gallbladder: No gallstones or wall thickening visualized. No sonographic Murphy sign noted by sonographer. Common bile duct: Diameter: 5.1 mm Liver: No focal lesion identified. Within normal limits in parenchymal echogenicity. Portal vein is patent on color Doppler imaging with normal direction of blood flow towards the liver. IVC: No abnormality visualized. Pancreas: Obscured by overlying bowel gas. Spleen: Size and appearance within normal limits. Right Kidney: Length: 11.3 cm.  Echogenicity within normal limits. No mass or hydronephrosis visualized. Left Kidney: Length: 11.9 cm. Echogenicity within normal limits. No mass or hydronephrosis visualized. 1.1 cm exophytic simple cyst. Abdominal aorta: No aneurysm visualized. Other findings: No free peritoneal fluid. IMPRESSION: No significant abnormality. Electronically Signed   By: Claudie Revering M.D.   On: 08/29/2018 14:35   Dg Chest Port 1 View  Result Date: 09/18/2018 CLINICAL DATA:  Shortness of breath. History of atrial fibrillation and valve replacement. EXAM: PORTABLE CHEST 1 VIEW COMPARISON:  Chest radiograph Apr 24, 2010 FINDINGS: Stable mild cardiomegaly. Mediastinal silhouette is not suspicious. Mildly calcified aortic arch. No pleural effusion or focal consolidation. Slight nodular scarring LEFT lung base. No pneumothorax. Cervical spine hardware laminectomies. Advanced degenerative changes shoulders, incompletely imaged. IMPRESSION: 1. Mild cardiomegaly.  No acute pulmonary process. 2.  Aortic Atherosclerosis (ICD10-I70.0). Electronically Signed   By: Elon Alas M.D.   On: 09/18/2018 15:40    LabWork shows age-adjusted d-dimer which is normal.  Lab work also consistent with mild anemia pretest clinical suspicion for pulmonary embolism is low.  BNP consistent with mild congestive heart failure clinically patient likely in mild congestive heart failure.  I'm concerned that he has been noncompliant with his Eliquis .  I have consulted [place] heart care cardiology service who will evaluate patient in the ED for overnight stay. Final Clinical Impressions(s) / ED Diagnoses  Dx #dyspnea #2anemia #3 medication non compliance  Final diagnoses:  None    ED Discharge Orders    None       Orlie Dakin, MD 09/18/18 [phone]

## 2018-09-18 NOTE — H&P (Addendum)
History & Physical    Patient ID: [name] MRN: [id number], DOB/AGE: [id number]   Admit date: 09/18/2018   Primary Physician: Delorise Shiner, MD Primary Cardiologist: Thayer Dallas / Elby Beck [phone])  Patient Profile    77 y/o ? with a history of severe aortic stenosis status post TAVR, permanent atrial fibrillation, prior stroke, degenerative disc disease with chronic neck and back pain status post multiple surgeries, nonobstructive CAD, hyperlipidemia, hypertension, and PFO, who presented to the emergency department on October 3 with a 3 to 4-day history of neck and chest tightness associated with dyspnea.  Past Medical History    Past Medical History:  Diagnosis Date  . Abdominal pain, generalized   . Abrasion or friction burn of other, multiple, and unspecified sites, without mention of infection   . Acute pain due to trauma   . Backache, unspecified   . Cancer ([place])   . Carpal tunnel syndrome   . Cervical spondylosis with myelopathy   . Cervical spondylosis without myelopathy   . Cervicalgia   . Chronic neck pain   . Chronic pain syndrome   . Chronic pharyngitis   . Degeneration of thoracic or thoracolumbar intervertebral disc   . Depression   . Depressive disorder, not elsewhere classified   . Difficult intubation    needs smaller tube; awake oral fiberoptic scope 8.0 ETT 03/30/08  . Dysphagia, pharyngeal phase   . Dysuria   . Encounter for removal of sutures 01/07/2013  . Hemorrhage of gastrointestinal tract, unspecified   . History of cardiac cath    a. 03/2018 Cath: LAD and LCX with mild luminal irregularities.  No significant disease.  . Impacted cerumen 02/26/2013  . Iron deficiency anemia, unspecified   . Lumbago   . Lumbosacral spondylosis without myelopathy   . Lumbosacral spondylosis without myelopathy   . Muscle weakness (generalized)   . Nonspecific abnormal results of pulmonary system function study   . Otalgia, unspecified   . Other and  unspecified disc disorder of cervical region   . Other and unspecified hyperlipidemia   . Other and unspecified open wound of head without mention of complication   . Other diseases of vocal cords   . Other dysphagia   . Other malaise and fatigue   . Pain in joint, shoulder region   . Patent foramen ovale    a. 05/2018 Echo: post-op TAVR--> + bublble study w/ L->R shunt.  . Permanent atrial fibrillation    a.  Diagnosed in the spring 2019.  Had rapid atrial fibrillation following TAVR and has been rate controlled with beta-blocker.  CHA2DS2VASc equals 7.  Supposed to be on Eliquis.  Marland Kitchen PFO (patent foramen ovale)    PFO present per 02/14/16 TEE report [facility])  . Primary localized osteoarthrosis, lower leg   . Primary localized osteoarthrosis, shoulder region   . Reflux esophagitis   . Retention of urine, unspecified   . Severe aortic stenosis    a. mild-mod AS by 01/2016 TTE, restricted AV opening with mod AR 01/2016 TEE; b. 10/2017 Echo: EF 60-65%, sev Ca2+ AoV, mean grad 76mmHg; c. 05/2018 s/p TAVR ([facility]); d. 06/2018 Echo: EF 55-60%, well-seated prosth AoV, peak velocity 156cm/s. AoV gradient 19mmHg.  Marland Kitchen Spinal stenosis, unspecified region other than cervical   . Stroke ([place])    01/2016  . Syncope and collapse   . Throat pain   . Unspecified arthropathy, lower leg   . Unspecified constipation   . Unspecified essential hypertension   .  Unspecified glaucoma(365.9)   . Unspecified sinusitis (chronic)     Past Surgical History:  Procedure Laterality Date  . BILATERAL CATARACT SURGERY  2009   DR GROAT   . CARDIAC VALVE REPLACEMENT  05/2018  . CERVIACAL SPINE (4X)     DR Yarrow Linhart ROY   . COLONOSCOPY  2007   DR HENSEL   . COLONOSCOPY WITH PROPOFOL N/A 11/20/2017   Procedure: COLONOSCOPY WITH PROPOFOL;  Surgeon: Wilford Corner, MD;  Location: [place];  Service: Endoscopy;  Laterality: N/A;  . JOINT REPLACEMENT     both knees  . LEFT KNEE REPLACEMENT     DR Maureen Ralphs  . LEFT TRANSVERSE  CARPAL LIGAMENT  01/06/2008  . ROTATOR CUFF LEFT SHOULDER  2001   DR MURPHY   . SHOULDER OPEN ROTATOR CUFF REPAIR  2006   DR [facility]  . SPINE SURGERY     neck fusion     Allergies  Allergies  Allergen Reactions  . Duloxetine Other (See Comments)    Increased depression, irritability  . Pregabalin Other (See Comments)    Flashbacks, Bad dreams, Aggressive behavior, swelling  . Zanaflex [Tizanidine Hcl] Anxiety    History of Present Illness    77 year old male with the above complex past medical history including severe aortic stenosis status post TAVR, permanent atrial fibrillation, prior stroke, degenerative disc disease with chronic neck and back pain status post multiple surgeries, nonobstructive CAD, hyperlipidemia, hypertension, and patent foramen ovale.  Patient was experiencing progressive dyspnea on exertion and was evaluated in the [place] in late 2018 with an echocardiogram, which showed normal LV function and a severely calcified aortic valve with a peak velocity of 3.74 m/s and a mean gradient of 35 mmHg.  He subsequently underwent diagnostic catheterization in April 2019 which showed mild pulmonary hypertension with a PA pressure 45/22 and a wedge pressure of 16.  Coronary arteries were patent with very mild luminal irregularities in the LAD and left circumflex.  He was referred to [facility] and was noted to be in atrial fibrillation at that time.  He was placed on Eliquis and then subsequently underwent T[facility]R on June 6 at [facility].  Postoperative course was comp gated by rapid atrial fibrillation which was subsequently rate controlled with metoprolol and Eliquis was eventually resumed.  A follow-up echocardiogram in July showed normal LV function with a properly functioning prosthetic aortic valve and a mean gradient of 5 mmHg.  Patient says he has noticed minimal improvement in exercise tolerance since his TAVR, saying he can now walk about 30 to 40 feet prior to  retiring.  He does not routinely exercise and activity is limited in general in the setting of chronic back and neck pain.  He was in his usual state of health until about 3 to 4 days ago, when he began to experience a constant neck and upper chest tightness that became worse when he tried to extend his neck.  Extension of the neck also caused dyspnea.  As this persisted for several days, he decided to come to the emergency room today.  Here, he is in atrial fibrillation with rates in the 90s to low 100s.  Initial troponin normal.  D-dimer mildly elevated but when adjusted for age normal.  BNP elevated at 705.9.  Chest x-ray without acute cardiopulmonary disease.  Currently is resting lying relatively flat in the stretcher.  He denies any chest pain or dyspnea at rest but then notes neck and chest discomfort when he extends  his neck.  He says his weight is actually down about 13 pounds over the past 3 weeks.  He has not been trying to lose weight.  He has not had any PND, orthopnea, or edema at home recently.  Home Medications    Prior to Admission medications   Medication Sig Start Date End Date Taking? Authorizing Provider  aspirin EC 81 MG tablet Take 81 mg by mouth daily.   Yes [provider]  buPROPion (WELLBUTRIN SR) 150 MG 12 hr tablet Take 150 mg by mouth 2 (two) times daily.    Yes [provider]  COLLAGEN PO Take 6 capsules by mouth daily.   Yes [provider]  cyanocobalamin (,VITAMIN B-12,) 1000 MCG/ML injection Inject 1,000 mcg into the muscle every 30 (thirty) days. 07/24/18  Yes [provider]  fluticasone (FLONASE) 50 MCG/ACT nasal spray Place 1 spray into both nostrils daily. 07/30/18  Yes [provider]  furosemide (LASIX) 20 MG tablet Take 20 mg by mouth daily.    Yes [provider]  gabapentin (NEURONTIN) 300 MG capsule Take 300 mg by mouth 3 (three) times daily. 08/10/18  Yes [provider]  lisinopril  (PRINIVIL,ZESTRIL) 30 MG tablet Take 30 mg by mouth daily.    Yes [provider]  methocarbamol (ROBAXIN) 500 MG tablet Take 500 mg by mouth 2 (two) times daily. 08/19/18  Yes [provider]  metoprolol succinate (TOPROL-XL) 25 MG 24 hr tablet Take 12.5 mg by mouth 2 (two) times daily. 09/03/18  Yes [provider]  Misc Natural Products (BETA-SITOSTEROL PLANT STEROLS) CAPS Take 1 capsule by mouth daily.   Yes [provider]  Omega-3 Fatty Acids (OMEGA 3 PO) Take 1 capsule by mouth 2 (two) times daily.   Yes [provider]  ondansetron (ZOFRAN) 8 MG tablet Take 8 mg by mouth every 8 (eight) hours as needed. for nausea 08/19/18  Yes [provider]  OVER THE COUNTER MEDICATION Nitro beets otc supplement - 1 rounded teaspoon once daily   Yes [provider]  oxyCODONE-acetaminophen (PERCOCET) 10-325 MG tablet Take 1 tablet by mouth 4 (four) times daily as needed for pain.    Yes [provider]  polyethylene glycol (MIRALAX / GLYCOLAX) packet Take 17 g by mouth daily.   Yes [provider]  rOPINIRole (REQUIP) 0.5 MG tablet Take 0.5 mg by mouth 2 (two) times daily.   Yes [provider]  silver sulfADIAZINE (SILVADENE) 1 % cream Apply 1 application topically 2 (two) times daily.   Yes [provider]  simvastatin (ZOCOR) 40 MG tablet Take 40 mg by mouth at bedtime.   Yes [provider]  TURMERIC PO Take 1-2 tablets by mouth daily.   Yes [provider]  ELIQUIS 5 MG TABS tablet Take 5 mg by mouth 2 (two) times daily. 07/13/18   [provider]  omeprazole (PRILOSEC) 20 MG capsule TAKE 2 CAPSULES DAILY TO REDUCE STOMACH ACID AND HELP HEARTBURN Patient not taking: Reported on 09/18/2018 05/07/13   [name], MD  topiramate (TOPAMAX) 25 MG capsule Take 25 mg by mouth 2 (two) times daily.     [provider]    Family History    Family History  Problem Relation Age  of Onset  . Cancer Mother 30       cervical cancer  . Cancer Father 68       lung  cancer   He indicated that his mother  is deceased. He indicated that his father is deceased. He indicated that his sister is alive.   Social History    Social History   Socioeconomic History  . Marital status: Single    Spouse name: Not on file  . Number of children: 0  . Years of education: GED  . Highest education level: Not on file  Occupational History  . Occupation: retired    Comment: 10 years in service, [employer]  . Financial resource strain: Not on file  . Food insecurity:    Worry: Not on file    Inability: Not on file  . Transportation needs:    Medical: Not on file    Non-medical: Not on file  Tobacco Use  . Smoking status: Former Smoker    Last attempt to quit: 08/29/1985    Years since quitting: 33.0  . Smokeless tobacco: Former Systems developer    Quit date: 02/12/1985  Substance and Sexual Activity  . Alcohol use: No  . Drug use: No  . Sexual activity: Not on file  Lifestyle  . Physical activity:    Days per week: Not on file    Minutes per session: Not on file  . Stress: Not on file  Relationships  . Social connections:    Talks on phone: Not on file    Gets together: Not on file    Attends religious service: Not on file    Active member of club or organization: Not on file    Attends meetings of clubs or organizations: Not on file    Relationship status: Not on file  . Intimate partner violence:    Fear of current or ex partner: Not on file    Emotionally abused: Not on file    Physically abused: Not on file    Forced sexual activity: Not on file  Other Topics Concern  . Not on file  Social History Narrative   Lives alone   caffeine use - none     Review of Systems    General:  No chills, fever, night sweats or weight changes.  Cardiovascular:  +++ chest pain, +++ dyspnea on exertion, no edema, orthopnea, palpitations, paroxysmal nocturnal  dyspnea. Dermatological: No rash, lesions/masses Respiratory: No cough, +++ dyspnea Urologic: No hematuria, dysuria Abdominal:   No nausea, vomiting, diarrhea, bright red blood per rectum, melena, or hematemesis Musculoskeletal: Chronic neck and back pain. Neurologic:  No visual changes, wkns, changes in mental status. All other systems reviewed and are otherwise negative except as noted above.  Physical Exam    Blood pressure 117/64, pulse (!) 103, temperature 97.9 F (36.6 C), temperature source Oral, resp. rate 16, SpO2 95 %.  General: Pleasant, NAD Psych: Normal affect. Neuro: Alert and oriented X 3. Moves all extremities spontaneously. HEENT: Normal  Neck: Supple without bruits.  JVP very difficult to gauge secondary to body habitus. Lungs:  Resp regular and unlabored, CTA. Heart: Irregularly irregular, no s3, s4, or murmurs. Abdomen: Soft, non-tender, non-distended, BS + x 4.  Extremities: No clubbing, cyanosis or edema. DP/PT/Radials 2+ and equal bilaterally.  Labs     Recent Labs    09/18/18 1501  TROPONINI <0.03   Lab Results  Component Value Date   WBC 8.0 09/18/2018   HGB 11.1 (L) 09/18/2018   HCT 37.8 (L) 09/18/2018   MCV 81.3 09/18/2018   PLT 231 09/18/2018    Recent Labs  Lab 09/18/18 1503  NA 137  K 4.5  CL  102  CO2 27  BUN 26*  CREATININE 1.15  CALCIUM 9.1  GLUCOSE 130*   Lab Results  Component Value Date   CHOL 151 03/07/2007   HDL 45 03/07/2007   LDLCALC 81 03/07/2007   TRIG 127 03/07/2007   Lab Results  Component Value Date   DDIMER 0.71 (H) 09/18/2018     Radiology Studies    Dg Cervical Spine Complete  Result Date: 09/03/2018 CLINICAL DATA:  C/o ROM in extension more limited after falling 1 month prior / neck pain / concern for hardware stability or FX / jdh 315 EXAM: CERVICAL SPINE - COMPLETE 4+ VIEW COMPARISON:  05/29/2018 FINDINGS: Craniocervical fusion noted with posterolateral facet screw and rod fixation at C3-4 extending  up to the occiput; and separate posterolateral rod and screw fixation extending at C4-C5-C6-C7, and with anterior plate and screw fixator at C6-7. Solid interbody and facet fusion at these levels, with flowing bony fusion extending up to the occiput along the posterior elements. As before, the spinous fused with 4 mm of anterolisthesis at C2-3. No new subluxation is identified. The degree of spurring and fusion makes assessment of the neural foramina difficult, and blurs intervertebral margins. There is quite likely narrowing of the neural foramina at C4-5, C5-6, and C6-7 although cross-sectional imaging would probably depict this better. No acute cervical spine findings. The odontoid and lateral masses of C1 are not well shown on the attempted odontoid view. The patient was not able to significantly open his mouth and the odontoid is obscured by the patient's hardware. IMPRESSION: 1. Similar appearance of fusion of all levels in the cervical spine and extending to the occiput. The spinous fused with 4 mm of anterolisthesis at C2-3 which is unchanged. No appreciable hardware failure. 2. I cannot exclude multilevel foraminal narrowing including C4-5, C5-6, and C6-7. 3. The odontoid is not well seen on the dedicated odontoid views, this is multifactorial. Electronically Signed   By: Van Clines M.D.   On: 09/03/2018 08:15   US Abdomen Complete  Result Date: 08/29/2018 CLINICAL DATA:  Generalized abdominal pain and distension. EXAM: ABDOMEN ULTRASOUND COMPLETE COMPARISON:  Abdomen and pelvis CT dated 03/26/2008. FINDINGS: Gallbladder: No gallstones or wall thickening visualized. No sonographic Murphy sign noted by sonographer. Common bile duct: Diameter: 5.1 mm Liver: No focal lesion identified. Within normal limits in parenchymal echogenicity. Portal vein is patent on color Doppler imaging with normal direction of blood flow towards the liver. IVC: No abnormality visualized. Pancreas: Obscured by overlying  bowel gas. Spleen: Size and appearance within normal limits. Right Kidney: Length: 11.3 cm. Echogenicity within normal limits. No mass or hydronephrosis visualized. Left Kidney: Length: 11.9 cm. Echogenicity within normal limits. No mass or hydronephrosis visualized. 1.1 cm exophytic simple cyst. Abdominal aorta: No aneurysm visualized. Other findings: No free peritoneal fluid. IMPRESSION: No significant abnormality. Electronically Signed   By: Claudie Revering M.D.   On: 08/29/2018 14:35   Dg Chest Port 1 View  Result Date: 09/18/2018 CLINICAL DATA:  Shortness of breath. History of atrial fibrillation and valve replacement. EXAM: PORTABLE CHEST 1 VIEW COMPARISON:  Chest radiograph Apr 24, 2010 FINDINGS: Stable mild cardiomegaly. Mediastinal silhouette is not suspicious. Mildly calcified aortic arch. No pleural effusion or focal consolidation. Slight nodular scarring LEFT lung base. No pneumothorax. Cervical spine hardware laminectomies. Advanced degenerative changes shoulders, incompletely imaged. IMPRESSION: 1. Mild cardiomegaly.  No acute pulmonary process. 2.  Aortic Atherosclerosis (ICD10-I70.0). Electronically Signed   By: Elon Alas M.D.   On:  09/18/2018 15:40    ECG & Cardiac Imaging    Coarse afib, 121, no acute st/t changes  Assessment & Plan    1.  Chest and neck pain: Patient presents with constant chest and neck tightness which has been worse with extension of his neck and associated with dyspnea over the past 3 to 4 days.  He also has chronic dyspnea on exertion which improved slightly following TAVR in June.  He previously underwent diagnostic catheterization earlier this year in April which showed minimal luminal irregularities in the LAD and circumflex.  Initial troponin is normal.  ECG notable for rapid atrial fibrillation but no acute ST or T changes.  We will observe tonight, especially in the setting of mild volume overload and rapid A. fib.  Provided the troponins remain normal,  would not pursue additional ischemic evaluation.  2.  Acute on chronic diastolic congestive heart failure: Patient with worsening dyspnea over the past 3 to 4 days.  BNP elevated.  Chest x-ray clear.  Body habitus prevents adequate evaluation of neck veins.  No edema.  He is on Lasix 20 mg daily at home.  We will give him 40 mg IV twice daily and hopefully can switch him back to orals tomorrow.  Will adjust beta-blocker dose to improve heart rate.  Most recent echo in July showed normal LV function.  3.  Aortic stenosis: Status post TAVR in June.  Follow-up echo in July showed a mean gradient of only 5 mmHg.  This is followed at [facility].  4.  Essential hypertension: Stable.  5.  Permanent atrial fibrillation: Patient was diagnosed with atrial for ablation earlier this year and then had rapid A. fib postop TAVR.  He has been rate controlled with beta-blocker therapy but did present with a rapid rate.  We will increase his Toprol-XL.  He is also supposed to be on Eliquis in the setting of a CHA2DS2VASc of 7 but it appears he has not been taking it for the past month.  This was accidental.  We will resume.  As he has not noted significant improvement following TAVR, it is likely that A. fib is contributing to his symptoms.  He will need outpatient cardiology follow-up at the [facility] with consideration for cardioversion in 3 to 4 weeks.  6.  Chronic neck and back pain: Likely contributing to current neck and chest symptoms.  Continue home medications.  7.  Restless leg syndrome: Continue home medication.  8.  Hyperlipidemia: Continue statin therapy.  Signed, Murray Hodgkins, NP 09/18/2018, 7:40 PM    Personally seen and examined. Agree with above.  77 year old with recent TAVR, atrial fibrillation pre-and post with restrictive neck physiology here with shortness of breath.  Admits that he only felt mild improvement in dyspnea on exertion post aortic valve replacement.  BNP  is mildly elevated.  On exam, [place], wide neck, unable to appreciate JVD, irregular rhythm, heart rate 86 on telemetry currently, no significant murmur heard on exam (mean gradient 5 mmHg), lungs with no significant crackles or wheezes, obese, trace edema bilateral lower extremities  Lab work, EKG personally reviewed and interpreted-atrial fibrillation with rapid ventricular response. Assessment: 77 year old post TAVR with persistent atrial fibrillation rapid ventricular response, acute diastolic heart failure, restrictive neck physiology, obesity Plan: Diurese with IV Lasix 40 mg twice daily, he was taking 20 mg once a day at home.  May need a higher dosage at discharge.  Continue to encourage exercise, weight loss.  We will increase  his metoprolol to help control his rate with atrial fibrillation.  We will also restart his Eliquis which he has not been taking.  After taking his Eliquis for 3 weeks with adequate rate control, one may consider DC cardioversion.  I think that transesophageal echocardiogram would be challenging given his neck physiology.  He would likely need antiarrhythmic in order to stay in normal rhythm.  We will continue to monitor. Candee Furbish, MD

## 2018-09-18 NOTE — ED Triage Notes (Signed)
Pt arrived via [facility] from PCP office c/o SOB x24 hours. Pt states discomfort felt from chest to throat, increasing with exertion. Pt has hx of valve replacement and A-Fib. Initial EMS Bp 202/110, 2nd check 160/106. rr 20-24, Sp02 92%ra, 96% 2LPM [place]. 324 ASA given PTA. Pt is alert and oriented x4 at time of triage. EDP at bedside upon pt arrival to ED.

## 2018-09-19 ENCOUNTER — Other Ambulatory Visit: Payer: Self-pay

## 2018-09-19 LAB — BASIC METABOLIC PANEL
Anion gap: 10 (ref 5–15)
BUN: 22 mg/dL (ref 8–23)
CO2: 25 mmol/L (ref 22–32)
Calcium: 8.7 mg/dL — ABNORMAL LOW (ref 8.9–10.3)
Chloride: 102 mmol/L (ref 98–111)
Creatinine, Ser: 0.88 mg/dL (ref 0.61–1.24)
GFR calc Af Amer: >60 mL/min (ref >60)
GFR calc non Af Amer: >60 mL/min (ref >60)
Glucose, Bld: 116 mg/dL — ABNORMAL HIGH (ref 70–99)
Potassium: 4.5 mmol/L (ref 3.5–5.1)
Sodium: 137 mmol/L (ref 135–145)

## 2018-09-19 LAB — TROPONIN I
Troponin I: <0.03 ng/mL (ref <0.03)
Troponin I: <0.03 ng/mL (ref <0.03)

## 2018-09-19 NOTE — Progress Notes (Addendum)
Progress Note  Patient Name: [name] Date of Encounter: 09/19/2018  Primary Cardiologist: No primary care provider on file.  [name] ([facility])  Subjective   [name] upper chest/neck tightness when he stretches his head up.  He feels like this is related to muscle strain.  He has no substernal chest discomfort.  He still has shortness of breath with exertion but he feels like his bendopnea is a little better today.  He says he got a good night sleep last night.  Inpatient Medications    Scheduled Meds: . apixaban  5 mg Oral BID  . buPROPion  150 mg Oral BID  . fluticasone  1 spray Each Nare Daily  . furosemide  40 mg Intravenous BID  . gabapentin  300 mg Oral TID  . lisinopril  30 mg Oral Daily  . methocarbamol  500 mg Oral BID  . metoprolol succinate  25 mg Oral BID  . polyethylene glycol  17 g Oral Daily  . rOPINIRole  0.5 mg Oral BID  . simvastatin  40 mg Oral QHS  . sodium chloride flush  3 mL Intravenous Q12H   Continuous Infusions: . sodium chloride     PRN Meds: sodium chloride, ondansetron, oxyCODONE-acetaminophen **AND** oxyCODONE, sodium chloride flush   Vital Signs    Vitals:   09/18/18 2052 09/18/18 2139 09/19/18 0201 09/19/18 0556  BP: 123/79 133/87 99/76 132/75  Pulse: 81 91 83 79  Resp:  (!) 22 (!) 22 20  Temp:  97.8 F (36.6 C) (!) 97.5 F (36.4 C) 97.6 F (36.4 C)  TempSrc:  Oral Oral Oral  SpO2: 96% 95% 98% 95%  Weight:  119.4 kg  119 kg  Height:  5\' 9"  (1.753 m)      Intake/Output Summary (Last 24 hours) at 09/19/2018 0832 Last data filed at 09/19/2018 0600 Gross per 24 hour  Intake 200 ml  Output 600 ml  Net -400 ml   Filed Weights   09/18/18 2139 09/19/18 0556  Weight: 119.4 kg 119 kg    Telemetry    Atrial fibrillation with rates in the 80s.  11 beat run of NSVT versus aberrancy this morning.- Personally Reviewed  ECG    No new tracings- Personally Reviewed  Physical Exam   GEN:  Obese male, no  acute distress.   Neck: No JVD Cardiac:  Irregularly irregular rhythm, no murmurs, rubs, or gallops.  Respiratory: Clear to auscultation bilaterally. GI: Soft, nontender, non-distended  MS: No edema; decreased range of motion of the neck Neuro:  Nonfocal  Psych: Normal affect   Labs    Chemistry Recent Labs  Lab 09/18/18 1503 09/19/18 0353  NA 137 137  K 4.5 4.5  CL 102 102  CO2 27 25  GLUCOSE 130* 116*  BUN 26* 22  CREATININE 1.15 0.88  CALCIUM 9.1 8.7*  GFRNONAA 60* >60  GFRAA >60 >60  ANIONGAP 8 10     Hematology Recent Labs  Lab 09/18/18 1503  WBC 8.0  RBC 4.65  HGB 11.1*  HCT 37.8*  MCV 81.3  MCH 23.9*  MCHC 29.4*  RDW 20.5*  PLT 231    Cardiac Enzymes Recent Labs  Lab 09/18/18 1501 09/18/18 2158 09/19/18 0353  TROPONINI <0.03 <0.03 <0.03   No results for input(s): TROPIPOC in the last 168 hours.   BNP Recent Labs  Lab 09/18/18 1503  BNP 705.9*     DDimer  Recent Labs  Lab 09/18/18 1501  DDIMER 0.71*  Radiology    Dg Chest Port 1 View  Result Date: 09/18/2018 CLINICAL DATA:  Shortness of breath. History of atrial fibrillation and valve replacement. EXAM: PORTABLE CHEST 1 VIEW COMPARISON:  Chest radiograph Apr 24, 2010 FINDINGS: Stable mild cardiomegaly. Mediastinal silhouette is not suspicious. Mildly calcified aortic arch. No pleural effusion or focal consolidation. Slight nodular scarring LEFT lung base. No pneumothorax. Cervical spine hardware laminectomies. Advanced degenerative changes shoulders, incompletely imaged. IMPRESSION: 1. Mild cardiomegaly.  No acute pulmonary process. 2.  Aortic Atherosclerosis (ICD10-I70.0). Electronically Signed   By: [name] M.D.   On: 09/18/2018 15:40    Cardiac Studies   TTE  07/03/18  SUMMARY The left ventricular size is normal. Mild left ventricular hypertrophy  Left ventricular systolic function is normal. LV ejection fraction = 55-60%.  Left ventricular filling pattern is  indeterminate. The left ventricular wall motion is normal. The right ventricle is normal in size and function. The left atrium is mildly to moderately dilated. The right atrium is mildly dilated. Injection of agitated saline showed no right-to-left shunt. S/p Aortic Valve Replacement with 34 mm Evolut R transcatheter heart valve. The prosthetic aortic valve is well-seated with normal function No paravalvular regurgitation. The peak aortic valve velocity 156 cm/s. Aortic valve mean pressure gradient is 5 mmHg. There is mild mitral regurgitation. There is mild tricuspid regurgitation. The aortic root is normal size. There is no pericardial effusion. As compared to prior study, there is probably no significant change. - FINDINGS:  LEFT VENTRICLE The left ventricular size is normal. Mild left ventricular hypertrophy. Left  ventricular systolic function is normal. LV ejection fraction = 55-60%. Left  ventricular filling pattern is indeterminate. The left ventricular wall  motion is normal.  Patient Profile     77 y.o. male with a history of severe aortic stenosis status post TAVR 05/2018, permanent atrial fibrillation, prior stroke, degenerative disc disease with chronic neck and back pain status post multiple surgeries, nonobstructive CAD, hyperlipidemia, hypertension, and PFO resented for constant chest and neck pain and also worsening dyspnea over past 3-4 days.  BNP elevated.  Assessment & Plan    Chest and neck pain -Patient continues to have atypical upper chest/anterior neck tightness with movement.  No substernal or exertional chest discomfort. -Troponins negative -His upper chest and neck tightness may be associated with his chronic back pain or muscle strain  Acute on chronic diastolic CHF -Patient has had chronic dyspnea which improved only slightly after TAVR in June.  Has had 3-4 days of worsening dyspnea. -BNP elevated, 705.  Chest x-ray clear. -On Lasix 20 mg daily at  home. -Has been started on Lasix 40 mg IV twice daily.  First dose [facility] at [facility] was not given until this morning. -1.2 L urine output -I feel that his symptoms and volume overload may be driven by uncontrolled A. fib rates.  Heart rates are now better controlled.  We will give Lasix IV for diuresis today and plan to discharge tomorrow.  He may need an increased home dose of Lasix. -Follow-up with the [facility] cardiology  Permanent atrial fibrillation/flutter -Has been rate controlled with beta-blocker but had rapid rate on presentation. -Toprol-XL dose was increased from 12.5 mg twice daily to 25 mg twice daily for better rate control.  Upon talking to him the patient was confused about his home dosing.  He says something about having been on 50 mg twice a day and was decreased to 12.5 mg and he is not really  sure what he is been taking. -On Eliquis for stroke risk reduction with CHA2DS2VASc of 7, however it was noted that the patient was not taking his Eliquis for the last month, but this morning the patient says he checked with his family at home and it appears he had been taking his Eliquis but it was only once a day. -Eliquis 5 mg twice daily has been resumed. -It is felt that since he has not had significant improvement in his symptoms post TAVR, it is likely that A. fib is contributing. -He is not likely a good candidate for TEE due to his decreased neck range of motion. -Plan for outpatient follow-up at the [facility] with consideration for cardioversion in 3-4 weeks -The patient says that he will call after discharge for follow-up at [facility].  S/P TAVR in 05/2018 -Follow-up echo in July showed a mean gradient of only 5 mmHg.  This is followed at [facility].  Essential hypertension -Blood pressure is well controlled.  Continue current therapy with lisinopril 30 mg, beta-blocker, Lasix  Hyperlipidemia -On simvastatin 40 mg daily   For questions or  updates, please contact [place] Please consult [email or web address] for contact info under      Signed, Daune Perch, NP  09/19/2018, 8:32 AM    ---------------------------------------------------------------------------   History and all data above reviewed.  Patient examined.  I agree with the findings as above.  Constance Hackenberg Guillen is doing well when I saw him, and he had diuresed well, with better rate control.   GEN: no acute distress.   Neck: JVP difficult to assess Cardiac:  Irregularly irregular rhythm, no murmurs, rubs, or gallops. Respiratory: Clear to auscultation bilaterally. GI: Soft, nontender, non-distended  MS: No edema; decreased range of motion of the neck Neuro:  Nonfocal  Psych: Normal affect  All available labs, radiology testing, previous records reviewed. Agree with documented assessment and plan of my colleague as stated above with the following additions or changes:  Active Problems:   Acute on chronic diastolic CHF (congestive heart failure) ([place])    Plan: agree as noted by my colleague above. Continue diuresis and rate control.  Elouise Munroe, MD [facility] 12:38 AM  09/20/2018

## 2018-09-20 DIAGNOSIS — I4821 Permanent atrial fibrillation: Secondary | ICD-10-CM

## 2018-09-20 DIAGNOSIS — I5033 Acute on chronic diastolic (congestive) heart failure: Secondary | ICD-10-CM

## 2018-09-20 DIAGNOSIS — Z952 Presence of prosthetic heart valve: Secondary | ICD-10-CM

## 2018-09-20 LAB — BASIC METABOLIC PANEL
Anion gap: 9 (ref 5–15)
BUN: 29 mg/dL — ABNORMAL HIGH (ref 8–23)
CO2: 31 mmol/L (ref 22–32)
Calcium: 9.2 mg/dL (ref 8.9–10.3)
Chloride: 97 mmol/L — ABNORMAL LOW (ref 98–111)
Creatinine, Ser: 1.04 mg/dL (ref 0.61–1.24)
GFR calc Af Amer: >60 mL/min (ref >60)
GFR calc non Af Amer: >60 mL/min (ref >60)
Glucose, Bld: 82 mg/dL (ref 70–99)
Potassium: 4.1 mmol/L (ref 3.5–5.1)
Sodium: 137 mmol/L (ref 135–145)

## 2018-09-20 MED ORDER — POTASSIUM CHLORIDE CRYS ER 20 MEQ PO TBCR: 20.0000 meq | EXTENDED_RELEASE_TABLET | Freq: Every day | ORAL | 5 refills | Status: DC

## 2018-09-20 MED ORDER — METOPROLOL SUCCINATE ER 25 MG PO TB24: 25.0000 mg | ORAL_TABLET | Freq: Two times a day (BID) | ORAL | 5 refills | Status: DC

## 2018-09-20 MED ORDER — FUROSEMIDE 20 MG PO TABS: ORAL_TABLET | ORAL | 5 refills | Status: DC

## 2018-09-20 NOTE — Discharge Summary (Signed)
Discharge Summary    Patient ID: [name],  MRN: [id number], DOB/AGE: 76-May-1942 76 y.o.  Admit date: 09/18/2018 Discharge date: 09/20/2018  Primary Care Provider: Delorise Shiner Primary Cardiologist: [name] / Elby Beck [id number])  Discharge Diagnoses    Principal Problem:   Acute on chronic diastolic CHF (congestive heart failure) ([place]) Active Problems:   S/P TAVR (transcatheter aortic valve replacement)   Permanent atrial fibrillation   History of Present Illness     [name] is a 77 y.o. male with past medical history of severe AS (s/p TAVR in 05/2018), permanent atrial fibrillation, HTN, HLD, nonobstructive CAD, known PFO, and prior CVA who presented to [facility] ED on 09/18/2018 for evaluation of chest discomfort and shortness of breath.  He reported being in his usual state of health until 4 days prior to admission when he developed a constant discomfort along his neck and upper chest which was worse with extending his neck.  While in the ED, he was noted to be in atrial fibrillation with heart rate in the 90's to low 100's. Initial troponin was normal and d-dimer was mildly elevated but normal when adjusted for age. BNP was elevated to 705 but CXR showed no acute cardiopulmonary disease. He was started on IV Lasix 40 mg twice daily for diuresis and was admitted for observation with cyclic troponin values pending.  Hospital Course     Consultants: None   The following morning, he reported still having chest/neck discomfort when moving his head but denied any exertional symptoms. Reported his respiratory status had improved but was not back to baseline. Troponin values remained negative and given that his recent cardiac catheterization showed nonobstructive CAD, no further ischemic evaluation was pursued.  He was therefore continued on Lasix for an additional day.   On 09/20/2018, he reported much improvement in his symptoms. He was overall -2.9 L throughout  admission and weight had declined by 5 lbs to a discharge weight of 257 lbs. He was last examined by Dr. Aundra Dubin and deemed stable for discharge. His PTA Lasix was increased to 40mg  in AM/20mg  in PM and Toprol-XL was increased from 12.5mg  BID to 25mg  BID for improved rate-control. He has been informed to follow-up with [facility] within the next 2 weeks. Was discharged home in stable condition.    _____________  Discharge Vitals Blood pressure (!) 113/91, pulse 90, temperature (!) 97.5 F (36.4 C), temperature source Oral, resp. rate 18, height 5\' 9"  (1.753 m), weight 116.9 kg, SpO2 97 %.  Filed Weights   09/18/18 2139 09/19/18 0556 09/20/18 0333  Weight: 119.4 kg 119 kg 116.9 kg    Labs & Radiologic Studies     CBC Recent Labs    09/18/18 1503  WBC 8.0  HGB 11.1*  HCT 37.8*  MCV 81.3  PLT 557   Basic Metabolic Panel Recent Labs    09/18/18 1503 09/19/18 0353 09/20/18 0631  NA 137 137 137  K 4.5 4.5 4.1  CL 102 102 97*  CO2 27 25 31   GLUCOSE 130* 116* 82  BUN 26* 22 29*  CREATININE 1.15 0.88 1.04  CALCIUM 9.1 8.7* 9.2  MG 2.0  --   --    Liver Function Tests No results for input(s): AST, ALT, ALKPHOS, BILITOT, PROT, ALBUMIN in the last 72 hours. No results for input(s): LIPASE, AMYLASE in the last 72 hours. Cardiac Enzymes Recent Labs    09/18/18 2158 09/19/18 0353 09/19/18 0857  TROPONINI <  0.03 <0.03 <0.03   BNP Invalid input(s): POCBNP D-Dimer Recent Labs    09/18/18 1501  DDIMER 0.71*   Hemoglobin A1C No results for input(s): HGBA1C in the last 72 hours. Fasting Lipid Panel No results for input(s): CHOL, HDL, LDLCALC, TRIG, CHOLHDL, LDLDIRECT in the last 72 hours. Thyroid Function Tests No results for input(s): TSH, T4TOTAL, T3FREE, THYROIDAB in the last 72 hours.  Invalid input(s): FREET3  Dg Cervical Spine Complete  Result Date: 09/03/2018 CLINICAL DATA:  C/o ROM in extension more limited after falling 1 month prior / neck pain / concern  for hardware stability or FX / jdh 315 EXAM: CERVICAL SPINE - COMPLETE 4+ VIEW COMPARISON:  05/29/2018 FINDINGS: Craniocervical fusion noted with posterolateral facet screw and rod fixation at C3-4 extending up to the occiput; and separate posterolateral rod and screw fixation extending at C4-C5-C6-C7, and with anterior plate and screw fixator at C6-7. Solid interbody and facet fusion at these levels, with flowing bony fusion extending up to the occiput along the posterior elements. As before, the spinous fused with 4 mm of anterolisthesis at C2-3. No new subluxation is identified. The degree of spurring and fusion makes assessment of the neural foramina difficult, and blurs intervertebral margins. There is quite likely narrowing of the neural foramina at C4-5, C5-6, and C6-7 although cross-sectional imaging would probably depict this better. No acute cervical spine findings. The odontoid and lateral masses of C1 are not well shown on the attempted odontoid view. The patient was not able to significantly open his mouth and the odontoid is obscured by the patient's hardware. IMPRESSION: 1. Similar appearance of fusion of all levels in the cervical spine and extending to the occiput. The spinous fused with 4 mm of anterolisthesis at C2-3 which is unchanged. No appreciable hardware failure. 2. I cannot exclude multilevel foraminal narrowing including C4-5, C5-6, and C6-7. 3. The odontoid is not well seen on the dedicated odontoid views, this is multifactorial. Electronically Signed   By: Van Clines M.D.   On: 09/03/2018 08:15   US Abdomen Complete  Result Date: 08/29/2018 CLINICAL DATA:  Generalized abdominal pain and distension. EXAM: ABDOMEN ULTRASOUND COMPLETE COMPARISON:  Abdomen and pelvis CT dated 03/26/2008. FINDINGS: Gallbladder: No gallstones or wall thickening visualized. No sonographic Murphy sign noted by sonographer. Common bile duct: Diameter: 5.1 mm Liver: No focal lesion identified. Within  normal limits in parenchymal echogenicity. Portal vein is patent on color Doppler imaging with normal direction of blood flow towards the liver. IVC: No abnormality visualized. Pancreas: Obscured by overlying bowel gas. Spleen: Size and appearance within normal limits. Right Kidney: Length: 11.3 cm. Echogenicity within normal limits. No mass or hydronephrosis visualized. Left Kidney: Length: 11.9 cm. Echogenicity within normal limits. No mass or hydronephrosis visualized. 1.1 cm exophytic simple cyst. Abdominal aorta: No aneurysm visualized. Other findings: No free peritoneal fluid. IMPRESSION: No significant abnormality. Electronically Signed   By: Claudie Revering M.D.   On: 08/29/2018 14:35   Dg Chest Port 1 View  Result Date: 09/18/2018 CLINICAL DATA:  Shortness of breath. History of atrial fibrillation and valve replacement. EXAM: PORTABLE CHEST 1 VIEW COMPARISON:  Chest radiograph Apr 24, 2010 FINDINGS: Stable mild cardiomegaly. Mediastinal silhouette is not suspicious. Mildly calcified aortic arch. No pleural effusion or focal consolidation. Slight nodular scarring LEFT lung base. No pneumothorax. Cervical spine hardware laminectomies. Advanced degenerative changes shoulders, incompletely imaged. IMPRESSION: 1. Mild cardiomegaly.  No acute pulmonary process. 2.  Aortic Atherosclerosis (ICD10-I70.0). Electronically Signed   By: Sandie Ano  Bloomer M.D.   On: 09/18/2018 15:40     Diagnostic Studies/Procedures    None Performed this admission.   Disposition   Pt is being discharged home today in good condition.  Follow-up Plans & Appointments    Follow-up Information    Cl[facility], [facility]. Schedule an appointment as soon as possible for a visit in 2 week(s).   Contact information: [place] [address] [phone]          Discharge Instructions    Diet - low sodium heart healthy   Complete by:  As directed    Increase activity slowly   Complete  by:  As directed       Discharge Medications     Medication List    TAKE these medications   aspirin EC 81 MG tablet Take 81 mg by mouth daily.   BETA-SITOSTEROL PLANT STEROLS Caps Take 1 capsule by mouth daily.   buPROPion 150 MG 12 hr tablet Commonly known as:  WELLBUTRIN SR Take 150 mg by mouth 2 (two) times daily.   COLLAGEN PO Take 6 capsules by mouth daily.   cyanocobalamin 1000 MCG/ML injection Commonly known as:  (VITAMIN B-12) Inject 1,000 mcg into the muscle every 30 (thirty) days.   ELIQUIS 5 MG Tabs tablet Generic drug:  apixaban Take 5 mg by mouth 2 (two) times daily.   fluticasone 50 MCG/ACT nasal spray Commonly known as:  FLONASE Place 1 spray into both nostrils daily.   furosemide 20 MG tablet Commonly known as:  LASIX Take 2 tablets (40mg ) in AM and 1 tablet (20mg  in PM) What changed:    how much to take  how to take this  when to take this  additional instructions   gabapentin 300 MG capsule Commonly known as:  NEURONTIN Take 300 mg by mouth 3 (three) times daily.   lisinopril 30 MG tablet Commonly known as:  PRINIVIL,ZESTRIL Take 30 mg by mouth daily.   methocarbamol 500 MG tablet Commonly known as:  ROBAXIN Take 500 mg by mouth 2 (two) times daily.   metoprolol succinate 25 MG 24 hr tablet Commonly known as:  TOPROL-XL Take 1 tablet (25 mg total) by mouth 2 (two) times daily. What changed:  how much to take   OMEGA 3 PO Take 1 capsule by mouth 2 (two) times daily.   omeprazole 20 MG capsule Commonly known as:  PRILOSEC TAKE 2 CAPSULES DAILY TO REDUCE STOMACH ACID AND HELP HEARTBURN   ondansetron 8 MG tablet Commonly known as:  ZOFRAN Take 8 mg by mouth every 8 (eight) hours as needed. for nausea   OVER THE COUNTER MEDICATION Nitro beets otc supplement - 1 rounded teaspoon once daily   oxyCODONE-acetaminophen 10-325 MG tablet Commonly known as:  PERCOCET Take 1 tablet by mouth 4 (four) times daily as needed for  pain.   polyethylene glycol packet Commonly known as:  MIRALAX / GLYCOLAX Take 17 g by mouth daily.   potassium chloride SA 20 MEQ tablet Commonly known as:  K-DUR,KLOR-CON Take 1 tablet (20 mEq total) by mouth daily.   rOPINIRole 0.5 MG tablet Commonly known as:  REQUIP Take 0.5 mg by mouth 2 (two) times daily.   silver sulfADIAZINE 1 % cream Commonly known as:  SILVADENE Apply 1 application topically 2 (two) times daily.   simvastatin 40 MG tablet Commonly known as:  ZOCOR Take 40 mg by mouth at bedtime.   topiramate 25 MG capsule Commonly known as:  TOPAMAX  Take 25 mg by mouth 2 (two) times daily.   TURMERIC PO Take 1-2 tablets by mouth daily.         Allergies Allergies  Allergen Reactions  . Duloxetine Other (See Comments)    Increased depression, irritability  . Pregabalin Other (See Comments)    Flashbacks, Bad dreams, Aggressive behavior, swelling  . Zanaflex [Tizanidine Hcl] Anxiety    Acute coronary syndrome (MI, NSTEMI, STEMI, etc) this admission?: No.   Outstanding Labs/Studies   BMET within the next 2 weeks.   Duration of Discharge Encounter   Greater than 30 minutes including physician time.  Signed, Erma Heritage, PA-C 09/20/2018, 2:53 PM

## 2018-09-20 NOTE — Progress Notes (Signed)
Patient ID: [name], male   DOB: 06-24-1941, 77 y.o.   MRN: [id number]   Progress Note  Patient Name: [name] Date of Encounter: 09/20/2018  Primary Cardiologist: No primary care provider on file.  [name])  Subjective   No complaints this morning.  Feels better.  Good diuresis yesterday.   Inpatient Medications    Scheduled Meds: . apixaban  5 mg Oral BID  . buPROPion  150 mg Oral BID  . fluticasone  1 spray Each Nare Daily  . furosemide  40 mg Intravenous BID  . gabapentin  300 mg Oral TID  . lisinopril  30 mg Oral Daily  . methocarbamol  500 mg Oral BID  . metoprolol succinate  25 mg Oral BID  . polyethylene glycol  17 g Oral Daily  . rOPINIRole  0.5 mg Oral BID  . simvastatin  40 mg Oral QHS  . sodium chloride flush  3 mL Intravenous Q12H   Continuous Infusions: . sodium chloride     PRN Meds: sodium chloride, ondansetron, oxyCODONE-acetaminophen **AND** oxyCODONE, sodium chloride flush   Vital Signs    Vitals:   09/19/18 1939 09/19/18 2251 09/20/18 0333 09/20/18 0930  BP: 99/62 (!) 143/79 133/81 111/69  Pulse: 97 74 81 100  Resp: 16  16   Temp: 97.8 F (36.6 C)  (!) 97 F (36.1 C)   TempSrc: Oral     SpO2: 95%  94% 97%  Weight:   116.9 kg   Height:        Intake/Output Summary (Last 24 hours) at 09/20/2018 1250 Last data filed at 09/20/2018 0948 Gross per 24 hour  Intake 480 ml  Output 1300 ml  Net -820 ml   Filed Weights   09/18/18 2139 09/19/18 0556 09/20/18 0333  Weight: 119.4 kg 119 kg 116.9 kg    Telemetry    Atrial fibrillation in 80s, personally reviewed.   ECG    No new tracings- Personally Reviewed  Physical Exam   General: NAD Neck: Thick, JVP difficult but does not appear elevated, no thyromegaly or thyroid nodule.  Lungs: Clear to auscultation bilaterally with normal respiratory effort. CV: Nondisplaced PMI.  Heart irregular S1/S2, no S3/S4, no murmur.  No peripheral edema.   Abdomen:  Soft, nontender, no hepatosplenomegaly, no distention.  Skin: Intact without lesions or rashes.  Neurologic: Alert and oriented x 3.  Psych: Normal affect. Extremities: No clubbing or cyanosis.  HEENT: Normal.   Labs    Chemistry Recent Labs  Lab 09/18/18 1503 09/19/18 0353 09/20/18 0631  NA 137 137 137  K 4.5 4.5 4.1  CL 102 102 97*  CO2 27 25 31   GLUCOSE 130* 116* 82  BUN 26* 22 29*  CREATININE 1.15 0.88 1.04  CALCIUM 9.1 8.7* 9.2  GFRNONAA 60* >60 >60  GFRAA >60 >60 >60  ANIONGAP 8 10 9      Hematology Recent Labs  Lab 09/18/18 1503  WBC 8.0  RBC 4.65  HGB 11.1*  HCT 37.8*  MCV 81.3  MCH 23.9*  MCHC 29.4*  RDW 20.5*  PLT 231    Cardiac Enzymes Recent Labs  Lab 09/18/18 1501 09/18/18 2158 09/19/18 0353 09/19/18 0857  TROPONINI <0.03 <0.03 <0.03 <0.03   No results for input(s): TROPIPOC in the last 168 hours.   BNP Recent Labs  Lab 09/18/18 1503  BNP 705.9*     DDimer  Recent Labs  Lab 09/18/18 1501  DDIMER 0.71*  Radiology    Dg Chest Port 1 View  Result Date: 09/18/2018 CLINICAL DATA:  Shortness of breath. History of atrial fibrillation and valve replacement. EXAM: PORTABLE CHEST 1 VIEW COMPARISON:  Chest radiograph Apr 24, 2010 FINDINGS: Stable mild cardiomegaly. Mediastinal silhouette is not suspicious. Mildly calcified aortic arch. No pleural effusion or focal consolidation. Slight nodular scarring LEFT lung base. No pneumothorax. Cervical spine hardware laminectomies. Advanced degenerative changes shoulders, incompletely imaged. IMPRESSION: 1. Mild cardiomegaly.  No acute pulmonary process. 2.  Aortic Atherosclerosis (ICD10-I70.0). Electronically Signed   By: Elon Alas M.D.   On: 09/18/2018 15:40    Cardiac Studies   TTE  07/03/18  SUMMARY The left ventricular size is normal. Mild left ventricular hypertrophy  Left ventricular systolic function is normal. LV ejection fraction = 55-60%.  Left ventricular filling  pattern is indeterminate. The left ventricular wall motion is normal. The right ventricle is normal in size and function. The left atrium is mildly to moderately dilated. The right atrium is mildly dilated. Injection of agitated saline showed no right-to-left shunt. S/p Aortic Valve Replacement with 34 mm Evolut R transcatheter heart valve. The prosthetic aortic valve is well-seated with normal function No paravalvular regurgitation. The peak aortic valve velocity 156 cm/s. Aortic valve mean pressure gradient is 5 mmHg. There is mild mitral regurgitation. There is mild tricuspid regurgitation. The aortic root is normal size. There is no pericardial effusion. As compared to prior study, there is probably no significant change. - FINDINGS:  LEFT VENTRICLE The left ventricular size is normal. Mild left ventricular hypertrophy. Left  ventricular systolic function is normal. LV ejection fraction = 55-60%. Left  ventricular filling pattern is indeterminate. The left ventricular wall  motion is normal.  Patient Profile     77 y.o. male with a history of severe aortic stenosis status post TAVR 05/2018, permanent atrial fibrillation, prior stroke, degenerative disc disease with chronic neck and back pain status post multiple surgeries, nonobstructive CAD, hyperlipidemia, hypertension, and PFO resented for constant chest and neck pain and also worsening dyspnea over past 3-4 days.  BNP elevated.  Assessment & Plan    Chest and neck pain: Troponin negative, atypical.  Now resolved.   Acute on chronic diastolic CHF: Patient has had chronic dyspnea which improved only slightly after TAVR in June.  Volume overloaded on admission, has been diuresed with IV Lasix.  Echo in 7/19 with normal EF.  Breathing better, volume status improved.  - Think he can transition to po Lasix, would use Lasix 40 qam/20 qpm.   Chronic atrial fibrillation/flutter: Has been rate controlled with beta-blocker but had  rapid rate on presentation. -Toprol-XL dose was increased from 12.5 mg twice daily to 25 mg twice daily for better rate control.   - Eliquis 5 mg twice daily has been resumed. - Plan for outpatient follow-up at [facility] with consideration for cardioversion in 3-4 weeks (poor candidate for TEE with minimal neck range of motion.  -The patient says that he will call after discharge for follow-up at [facility].  S/P TAVR in 05/2018: Follow-up echo in July showed a mean gradient of only 5 mmHg.  This is followed at [facility].  Essential hypertension: Blood pressure is well controlled.  Continue current therapy with lisinopril, beta-blocker, Lasix  Hyperlipidemia: On simvastatin 40 mg daily  Patient may go home today.  Will followup at [facility].  Meds for discharge: Lasix 40qam/20qpm, apixaban 5 bid, lisinopril 30 daily, Toprol XL 25 bid, KCl  20 daily, simvastatin 40 daily.    For questions or updates, please contact [place] Please consult [email or web address] for contact info under      Signed, Loralie Champagne, MD  09/20/2018, 12:50 PM

## 2018-09-20 NOTE — Plan of Care (Signed)
  Problem: Education: Goal: Ability to demonstrate management of disease process will improve Outcome: Progressing   Problem: Activity: Goal: Capacity to carry out activities will improve Outcome: Progressing   

## 2018-09-20 NOTE — Progress Notes (Signed)
Discussed discharge instructions with patient including medications and follow appointment recommendation.   Sent paper work home with patient.   No further questions asked.

## 2018-09-20 NOTE — Discharge Instructions (Signed)

## 2018-09-25 DIAGNOSIS — E291 Testicular hypofunction: Secondary | ICD-10-CM | POA: Diagnosis not present

## 2018-09-25 DIAGNOSIS — I4891 Unspecified atrial fibrillation: Secondary | ICD-10-CM | POA: Diagnosis not present

## 2018-09-25 DIAGNOSIS — F39 Unspecified mood [affective] disorder: Secondary | ICD-10-CM | POA: Diagnosis not present

## 2018-09-25 DIAGNOSIS — I5020 Unspecified systolic (congestive) heart failure: Secondary | ICD-10-CM | POA: Diagnosis not present

## 2018-10-02 DIAGNOSIS — M48061 Spinal stenosis, lumbar region without neurogenic claudication: Secondary | ICD-10-CM | POA: Diagnosis not present

## 2018-10-02 DIAGNOSIS — M5416 Radiculopathy, lumbar region: Secondary | ICD-10-CM | POA: Diagnosis not present

## 2018-10-02 DIAGNOSIS — M47896 Other spondylosis, lumbar region: Secondary | ICD-10-CM | POA: Diagnosis not present

## 2018-10-13 ENCOUNTER — Emergency Department (HOSPITAL_COMMUNITY): Payer: Medicare Other

## 2018-10-13 ENCOUNTER — Other Ambulatory Visit: Payer: Self-pay

## 2018-10-13 ENCOUNTER — Encounter (HOSPITAL_COMMUNITY): Payer: Self-pay | Admitting: Emergency Medicine

## 2018-10-13 ENCOUNTER — Inpatient Hospital Stay (HOSPITAL_COMMUNITY)
Admission: EM | Admit: 2018-10-13 | Discharge: 2018-10-16 | DRG: 377 | Disposition: A | Discharge status: Home Health | Payer: Medicare Other | Attending: Internal Medicine | Admitting: Internal Medicine

## 2018-10-13 DIAGNOSIS — K921 Melena: Secondary | ICD-10-CM | POA: Diagnosis not present

## 2018-10-13 DIAGNOSIS — I129 Hypertensive chronic kidney disease with stage 1 through stage 4 chronic kidney disease, or unspecified chronic kidney disease: Secondary | ICD-10-CM | POA: Diagnosis present

## 2018-10-13 DIAGNOSIS — R410 Disorientation, unspecified: Secondary | ICD-10-CM | POA: Diagnosis not present

## 2018-10-13 DIAGNOSIS — E7800 Pure hypercholesterolemia, unspecified: Secondary | ICD-10-CM | POA: Diagnosis not present

## 2018-10-13 DIAGNOSIS — G9340 Encephalopathy, unspecified: Secondary | ICD-10-CM | POA: Diagnosis not present

## 2018-10-13 DIAGNOSIS — G8929 Other chronic pain: Secondary | ICD-10-CM | POA: Diagnosis not present

## 2018-10-13 DIAGNOSIS — D62 Acute posthemorrhagic anemia: Secondary | ICD-10-CM | POA: Diagnosis present

## 2018-10-13 DIAGNOSIS — Z79899 Other long term (current) drug therapy: Secondary | ICD-10-CM

## 2018-10-13 DIAGNOSIS — F329 Major depressive disorder, single episode, unspecified: Secondary | ICD-10-CM | POA: Diagnosis not present

## 2018-10-13 DIAGNOSIS — N179 Acute kidney failure, unspecified: Secondary | ICD-10-CM | POA: Diagnosis not present

## 2018-10-13 DIAGNOSIS — M25512 Pain in left shoulder: Secondary | ICD-10-CM

## 2018-10-13 DIAGNOSIS — Z952 Presence of prosthetic heart valve: Secondary | ICD-10-CM | POA: Diagnosis not present

## 2018-10-13 DIAGNOSIS — R42 Dizziness and giddiness: Secondary | ICD-10-CM | POA: Diagnosis not present

## 2018-10-13 DIAGNOSIS — H409 Unspecified glaucoma: Secondary | ICD-10-CM | POA: Diagnosis present

## 2018-10-13 DIAGNOSIS — Z801 Family history of malignant neoplasm of trachea, bronchus and lung: Secondary | ICD-10-CM

## 2018-10-13 DIAGNOSIS — F0390 Unspecified dementia without behavioral disturbance: Secondary | ICD-10-CM | POA: Diagnosis not present

## 2018-10-13 DIAGNOSIS — I4821 Permanent atrial fibrillation: Secondary | ICD-10-CM | POA: Diagnosis present

## 2018-10-13 DIAGNOSIS — Z888 Allergy status to other drugs, medicaments and biological substances status: Secondary | ICD-10-CM | POA: Diagnosis not present

## 2018-10-13 DIAGNOSIS — G9341 Metabolic encephalopathy: Secondary | ICD-10-CM | POA: Diagnosis present

## 2018-10-13 DIAGNOSIS — N183 Chronic kidney disease, stage 3 (moderate): Secondary | ICD-10-CM | POA: Diagnosis present

## 2018-10-13 DIAGNOSIS — Z981 Arthrodesis status: Secondary | ICD-10-CM

## 2018-10-13 DIAGNOSIS — Z7951 Long term (current) use of inhaled steroids: Secondary | ICD-10-CM | POA: Diagnosis not present

## 2018-10-13 DIAGNOSIS — K922 Gastrointestinal hemorrhage, unspecified: Secondary | ICD-10-CM | POA: Diagnosis not present

## 2018-10-13 DIAGNOSIS — Z7901 Long term (current) use of anticoagulants: Secondary | ICD-10-CM

## 2018-10-13 DIAGNOSIS — I10 Essential (primary) hypertension: Secondary | ICD-10-CM | POA: Diagnosis not present

## 2018-10-13 DIAGNOSIS — Z87891 Personal history of nicotine dependence: Secondary | ICD-10-CM

## 2018-10-13 DIAGNOSIS — K254 Chronic or unspecified gastric ulcer with hemorrhage: Principal | ICD-10-CM | POA: Diagnosis present

## 2018-10-13 DIAGNOSIS — G894 Chronic pain syndrome: Secondary | ICD-10-CM | POA: Diagnosis present

## 2018-10-13 DIAGNOSIS — R4182 Altered mental status, unspecified: Secondary | ICD-10-CM | POA: Diagnosis not present

## 2018-10-13 DIAGNOSIS — E785 Hyperlipidemia, unspecified: Secondary | ICD-10-CM | POA: Diagnosis not present

## 2018-10-13 DIAGNOSIS — Z7982 Long term (current) use of aspirin: Secondary | ICD-10-CM | POA: Diagnosis not present

## 2018-10-13 DIAGNOSIS — M19012 Primary osteoarthritis, left shoulder: Secondary | ICD-10-CM | POA: Diagnosis not present

## 2018-10-13 DIAGNOSIS — Z96653 Presence of artificial knee joint, bilateral: Secondary | ICD-10-CM | POA: Diagnosis present

## 2018-10-13 DIAGNOSIS — F32A Depression, unspecified: Secondary | ICD-10-CM | POA: Diagnosis present

## 2018-10-13 DIAGNOSIS — R404 Transient alteration of awareness: Secondary | ICD-10-CM | POA: Diagnosis not present

## 2018-10-13 DIAGNOSIS — Z8673 Personal history of transient ischemic attack (TIA), and cerebral infarction without residual deficits: Secondary | ICD-10-CM | POA: Diagnosis not present

## 2018-10-13 DIAGNOSIS — I350 Nonrheumatic aortic (valve) stenosis: Secondary | ICD-10-CM | POA: Diagnosis not present

## 2018-10-13 DIAGNOSIS — K259 Gastric ulcer, unspecified as acute or chronic, without hemorrhage or perforation: Secondary | ICD-10-CM | POA: Diagnosis not present

## 2018-10-13 DIAGNOSIS — Z808 Family history of malignant neoplasm of other organs or systems: Secondary | ICD-10-CM

## 2018-10-13 DIAGNOSIS — K2971 Gastritis, unspecified, with bleeding: Secondary | ICD-10-CM

## 2018-10-13 DIAGNOSIS — I959 Hypotension, unspecified: Secondary | ICD-10-CM | POA: Diagnosis not present

## 2018-10-13 DIAGNOSIS — K2951 Unspecified chronic gastritis with bleeding: Secondary | ICD-10-CM | POA: Diagnosis not present

## 2018-10-13 DIAGNOSIS — R4781 Slurred speech: Secondary | ICD-10-CM | POA: Diagnosis not present

## 2018-10-13 LAB — COMPREHENSIVE METABOLIC PANEL
ALBUMIN: 3.6 g/dL (ref 3.5–5.0)
ALT: 17 U/L (ref 0–44)
ANION GAP: 8 (ref 5–15)
AST: 23 U/L (ref 15–41)
Alkaline Phosphatase: 56 U/L (ref 38–126)
BILIRUBIN TOTAL: 0.5 mg/dL (ref 0.3–1.2)
BUN: 68 mg/dL — AB (ref 8–23)
CO2: 23 mmol/L (ref 22–32)
Calcium: 9.1 mg/dL (ref 8.9–10.3)
Chloride: 110 mmol/L (ref 98–111)
Creatinine, Ser: 2.26 mg/dL — ABNORMAL HIGH (ref 0.61–1.24)
GFR calc Af Amer: 31 mL/min — ABNORMAL LOW (ref >60)
GFR calc non Af Amer: 26 mL/min — ABNORMAL LOW (ref >60)
Glucose, Bld: 88 mg/dL (ref 70–99)
POTASSIUM: 5 mmol/L (ref 3.5–5.1)
SODIUM: 141 mmol/L (ref 135–145)
Total Protein: 7 g/dL (ref 6.5–8.1)

## 2018-10-13 LAB — CBC WITH DIFFERENTIAL/PLATELET
BASOS PCT: 2 %
Basophils Absolute: 0.2 10*3/uL — ABNORMAL HIGH (ref 0.0–0.1)
EOS ABS: 0.3 10*3/uL (ref 0.0–0.5)
Eosinophils Relative: 4 %
HEMATOCRIT: 27.2 % — AB (ref 39.0–52.0)
HEMOGLOBIN: 7.9 g/dL — AB (ref 13.0–17.0)
LYMPHS ABS: 0.8 10*3/uL (ref 0.7–4.0)
LYMPHS PCT: 11 %
MCH: 24.9 pg — ABNORMAL LOW (ref 26.0–34.0)
MCHC: 29 g/dL — AB (ref 30.0–36.0)
MCV: 85.8 fL (ref 80.0–100.0)
MONOS PCT: 10 %
Monocytes Absolute: 0.8 10*3/uL (ref 0.1–1.0)
NEUTROS ABS: 5.8 10*3/uL (ref 1.7–7.7)
NEUTROS PCT: 73 %
NRBC: 0 % (ref 0.0–0.2)
NRBC: 0 /100{WBCs}
Platelets: 288 10*3/uL (ref 150–400)
RBC: 3.17 MIL/uL — ABNORMAL LOW (ref 4.22–5.81)
RDW: 23 % — AB (ref 11.5–15.5)
WBC: 7.9 10*3/uL (ref 4.0–10.5)

## 2018-10-13 LAB — URINALYSIS, ROUTINE W REFLEX MICROSCOPIC
Bilirubin Urine: NEGATIVE
GLUCOSE, UA: NEGATIVE mg/dL
HGB URINE DIPSTICK: NEGATIVE
KETONES UR: NEGATIVE mg/dL
NITRITE: NEGATIVE
PROTEIN: NEGATIVE mg/dL
Specific Gravity, Urine: 1.015 (ref 1.005–1.030)
pH: 5 (ref 5.0–8.0)

## 2018-10-13 LAB — I-STAT VENOUS BLOOD GAS, ED
Acid-base deficit: 2 mmol/L (ref 0.0–2.0)
BICARBONATE: 25 mmol/L (ref 20.0–28.0)
O2 SAT: 58 %
PCO2 VEN: 52.7 mmHg (ref 44.0–60.0)
PO2 VEN: 34 mmHg (ref 32.0–45.0)
TCO2: 27 mmol/L (ref 22–32)
pH, Ven: 7.284 (ref 7.250–7.430)

## 2018-10-13 LAB — CBC
HCT: 26.4 % — ABNORMAL LOW (ref 39.0–52.0)
HEMOGLOBIN: 7.5 g/dL — AB (ref 13.0–17.0)
MCH: 24.1 pg — ABNORMAL LOW (ref 26.0–34.0)
MCHC: 28.4 g/dL — AB (ref 30.0–36.0)
MCV: 84.9 fL (ref 80.0–100.0)
NRBC: 0 % (ref 0.0–0.2)
Platelets: 278 10*3/uL (ref 150–400)
RBC: 3.11 MIL/uL — ABNORMAL LOW (ref 4.22–5.81)
RDW: 22.6 % — AB (ref 11.5–15.5)
WBC: 7.2 10*3/uL (ref 4.0–10.5)

## 2018-10-13 LAB — I-STAT CHEM 8, ED
BUN: 64 mg/dL — AB (ref 8–23)
CALCIUM ION: 1.13 mmol/L — AB (ref 1.15–1.40)
CHLORIDE: 108 mmol/L (ref 98–111)
Creatinine, Ser: 2.4 mg/dL — ABNORMAL HIGH (ref 0.61–1.24)
Glucose, Bld: 87 mg/dL (ref 70–99)
HEMATOCRIT: 26 % — AB (ref 39.0–52.0)
Hemoglobin: 8.8 g/dL — ABNORMAL LOW (ref 13.0–17.0)
Potassium: 4.9 mmol/L (ref 3.5–5.1)
Sodium: 142 mmol/L (ref 135–145)
TCO2: 24 mmol/L (ref 22–32)

## 2018-10-13 LAB — PROTIME-INR
INR: 1.45
PROTHROMBIN TIME: 17.5 s — AB (ref 11.4–15.2)

## 2018-10-13 LAB — I-STAT CG4 LACTIC ACID, ED: Lactic Acid, Venous: 1.49 mmol/L (ref 0.5–1.9)

## 2018-10-13 LAB — ETHANOL: Alcohol, Ethyl (B): <10 mg/dL (ref <10)

## 2018-10-13 LAB — AMMONIA: AMMONIA: 28 umol/L (ref 9–35)

## 2018-10-13 LAB — POC OCCULT BLOOD, ED: Fecal Occult Bld: POSITIVE — AB

## 2018-10-13 MED ORDER — ASPIRIN EC 81 MG PO TBEC
81.0000 mg | DELAYED_RELEASE_TABLET | Freq: Every day | ORAL | Status: DC
  Filled 2018-10-13: qty 1

## 2018-10-13 MED ORDER — ACETAMINOPHEN 650 MG RE SUPP: 650.0000 mg | Freq: Four times a day (QID) | RECTAL | Status: DC | PRN

## 2018-10-13 MED ORDER — SODIUM CHLORIDE 0.9 % IV BOLUS
1000.0000 mL | Freq: Once | INTRAVENOUS | Status: AC
  Administered 2018-10-13: 1000 mL via INTRAVENOUS

## 2018-10-13 MED ORDER — PANTOPRAZOLE SODIUM 40 MG IV SOLR: 40.0000 mg | Freq: Once | INTRAVENOUS | Status: DC

## 2018-10-13 MED ORDER — PANTOPRAZOLE SODIUM 40 MG IV SOLR
40.0000 mg | Freq: Once | INTRAVENOUS | Status: DC
  Filled 2018-10-13: qty 40

## 2018-10-13 MED ORDER — PANTOPRAZOLE SODIUM 40 MG IV SOLR
40.0000 mg | Freq: Once | INTRAVENOUS | Status: AC
  Administered 2018-10-13: 40 mg via INTRAVENOUS
  Filled 2018-10-13: qty 40

## 2018-10-13 MED ORDER — OXYCODONE-ACETAMINOPHEN 10-325 MG PO TABS: 1.0000 | ORAL_TABLET | Freq: Four times a day (QID) | ORAL | Status: DC | PRN

## 2018-10-13 MED ORDER — NALOXONE HCL 0.4 MG/ML IJ SOLN: 0.1000 mg | Freq: Once | INTRAMUSCULAR | Status: DC

## 2018-10-13 MED ORDER — METHOCARBAMOL 500 MG PO TABS
500.0000 mg | ORAL_TABLET | Freq: Two times a day (BID) | ORAL | Status: DC
  Administered 2018-10-13: 500 mg via ORAL
  Filled 2018-10-13 (×2): qty 1

## 2018-10-13 MED ORDER — ROPINIROLE HCL 0.25 MG PO TABS
0.2500 mg | ORAL_TABLET | Freq: Two times a day (BID) | ORAL | Status: DC
  Administered 2018-10-13 – 2018-10-16 (×6): 0.25 mg via ORAL
  Filled 2018-10-13 (×7): qty 1

## 2018-10-13 MED ORDER — ACETAMINOPHEN 325 MG PO TABS
650.0000 mg | ORAL_TABLET | Freq: Four times a day (QID) | ORAL | Status: DC | PRN
  Filled 2018-10-13: qty 2

## 2018-10-13 MED ORDER — QUETIAPINE FUMARATE 25 MG PO TABS
25.0000 mg | ORAL_TABLET | Freq: Every day | ORAL | Status: DC
  Administered 2018-10-13 – 2018-10-15 (×3): 25 mg via ORAL
  Filled 2018-10-13 (×3): qty 1

## 2018-10-13 MED ORDER — OXYCODONE-ACETAMINOPHEN 5-325 MG PO TABS: 1.0000 | ORAL_TABLET | Freq: Four times a day (QID) | ORAL | Status: DC | PRN

## 2018-10-13 MED ORDER — LACTATED RINGERS IV SOLN
INTRAVENOUS | Status: DC
  Administered 2018-10-13 – 2018-10-14 (×3): via INTRAVENOUS

## 2018-10-13 MED ORDER — FLUTICASONE PROPIONATE 50 MCG/ACT NA SUSP
1.0000 | Freq: Every day | NASAL | Status: DC
  Administered 2018-10-14 – 2018-10-16 (×3): 1 via NASAL
  Filled 2018-10-13: qty 16

## 2018-10-13 MED ORDER — HALOPERIDOL LACTATE 5 MG/ML IJ SOLN
2.5000 mg | Freq: Once | INTRAMUSCULAR | Status: AC
  Administered 2018-10-13: 2.5 mg via INTRAVENOUS
  Filled 2018-10-13: qty 1

## 2018-10-13 MED ORDER — METOPROLOL SUCCINATE ER 25 MG PO TB24
25.0000 mg | ORAL_TABLET | Freq: Two times a day (BID) | ORAL | Status: DC
  Administered 2018-10-13 – 2018-10-15 (×5): 25 mg via ORAL
  Filled 2018-10-13 (×6): qty 1

## 2018-10-13 MED ORDER — BUPROPION HCL ER (XL) 150 MG PO TB24
150.0000 mg | ORAL_TABLET | Freq: Every day | ORAL | Status: DC
  Administered 2018-10-14 – 2018-10-16 (×3): 150 mg via ORAL
  Filled 2018-10-13 (×3): qty 1

## 2018-10-13 MED ORDER — GABAPENTIN 300 MG PO CAPS
300.0000 mg | ORAL_CAPSULE | Freq: Three times a day (TID) | ORAL | Status: DC
  Administered 2018-10-13: 300 mg via ORAL
  Filled 2018-10-13 (×2): qty 1

## 2018-10-13 MED ORDER — OXYCODONE HCL 5 MG PO TABS
5.0000 mg | ORAL_TABLET | Freq: Four times a day (QID) | ORAL | Status: DC | PRN
  Administered 2018-10-13: 5 mg via ORAL
  Filled 2018-10-13: qty 1

## 2018-10-13 MED ORDER — ONDANSETRON HCL 4 MG PO TABS: 4.0000 mg | ORAL_TABLET | Freq: Four times a day (QID) | ORAL | Status: DC | PRN

## 2018-10-13 MED ORDER — ONDANSETRON HCL 4 MG/2ML IJ SOLN: 4.0000 mg | Freq: Four times a day (QID) | INTRAMUSCULAR | Status: DC | PRN

## 2018-10-13 MED ORDER — SIMVASTATIN 40 MG PO TABS
40.0000 mg | ORAL_TABLET | Freq: Every day | ORAL | Status: DC
  Administered 2018-10-13 – 2018-10-15 (×3): 40 mg via ORAL
  Filled 2018-10-13 (×3): qty 1

## 2018-10-13 MED ORDER — SODIUM CHLORIDE 0.9% FLUSH
3.0000 mL | Freq: Two times a day (BID) | INTRAVENOUS | Status: DC
  Administered 2018-10-13 – 2018-10-15 (×5): 3 mL via INTRAVENOUS

## 2018-10-13 NOTE — ED Triage Notes (Signed)
Pt arrives by [facility] from a dentist office who called out due to pt having altered mental status. Pt reports he woke up yesterday morning and was having trouble walking and felt off balance. Pt reports black stools that he noticed yesterday for the first time. Pt states his sister also told him yesterday that something was wrong with his speech. Pt drove him self to the dentist today parking his car in an usual place not in a parking spot- went into the dentist and was acting usual and kept falling asleep. Pt arrives to ED with garbled speech but alert and ox4.

## 2018-10-13 NOTE — ED Provider Notes (Signed)
[name] Provider Note   CSN: [id number] Arrival date & time: 10/13/18  1206     History   Chief Complaint Chief Complaint  Patient presents with  . Altered Mental Status    HPI [name] is a 77 y.o. male.  77 yo M with a cc of ams.  Patient felt "funny" yesterday.  Had one dark stool.  Acting strange at dinner last night.  Went to the dentist this morning, felt was acting bizarrely and called EMS.  Patient is unable to describe what funny meant.  He repeats strong over and over again.  He does not think that he started taking any new medications.  Does not think that he has had a history of A. fib before.  Is on Eliquis for his heart.  Level 5 caveat altered mental status.  Denies chest pain, sob, fevers, chills, sick contacts.      The history is provided by the patient.  Illness  This is a new problem. The current episode started yesterday. The problem occurs constantly. The problem has been gradually worsening. Pertinent negatives include no chest pain, no abdominal pain, no headaches and no shortness of breath. Nothing aggravates the symptoms. Nothing relieves the symptoms. He has tried nothing for the symptoms. The treatment provided no relief.    Past Medical History:  Diagnosis Date  . Abdominal pain, generalized   . Abrasion or friction burn of other, multiple, and unspecified sites, without mention of infection   . Acute pain due to trauma   . Backache, unspecified   . Cancer ([place])   . Carpal tunnel syndrome   . Cervical spondylosis with myelopathy   . Cervical spondylosis without myelopathy   . Cervicalgia   . Chronic neck pain   . Chronic pain syndrome   . Chronic pharyngitis   . Degeneration of thoracic or thoracolumbar intervertebral disc   . Depression   . Depressive disorder, not elsewhere classified   . Difficult intubation    needs smaller tube; awake oral fiberoptic scope 8.0 ETT 03/30/08  . Dysphagia,  pharyngeal phase   . Dysuria   . Encounter for removal of sutures 01/07/2013  . Hemorrhage of gastrointestinal tract, unspecified   . History of cardiac cath    a. 03/2018 Cath: LAD and LCX with mild luminal irregularities.  No significant disease.  . Impacted cerumen 02/26/2013  . Iron deficiency anemia, unspecified   . Lumbago   . Lumbosacral spondylosis without myelopathy   . Lumbosacral spondylosis without myelopathy   . Muscle weakness (generalized)   . Nonspecific abnormal results of pulmonary system function study   . Otalgia, unspecified   . Other and unspecified disc disorder of cervical region   . Other and unspecified hyperlipidemia   . Other and unspecified open wound of head without mention of complication   . Other diseases of vocal cords   . Other dysphagia   . Other malaise and fatigue   . Pain in joint, shoulder region   . Patent foramen ovale    a. 05/2018 Echo: post-op TAVR--> + bublble study w/ L->R shunt.  . Permanent atrial fibrillation    a.  Diagnosed in the spring 2019.  Had rapid atrial fibrillation following TAVR and has been rate controlled with beta-blocker.  CHA2DS2VASc equals 7.  Supposed to be on Eliquis.  Marland Kitchen PFO (patent foramen ovale)    PFO present per 02/14/16 TEE report [facility])  . Primary localized osteoarthrosis, lower  leg   . Primary localized osteoarthrosis, shoulder region   . Reflux esophagitis   . Retention of urine, unspecified   . Severe aortic stenosis    a. mild-mod AS by 01/2016 TTE, restricted AV opening with mod AR 01/2016 TEE; b. 10/2017 Echo: EF 60-65%, sev Ca2+ AoV, mean grad 57mmHg; c. 05/2018 s/p TAVR ([facility]); d. 06/2018 Echo: EF 55-60%, well-seated prosth AoV, peak velocity 156cm/s. AoV gradient 3mmHg.  Marland Kitchen Spinal stenosis, unspecified region other than cervical   . Stroke ([place])    01/2016  . Syncope and collapse   . Throat pain   . Unspecified arthropathy, lower leg   . Unspecified constipation   . Unspecified essential hypertension     . Unspecified glaucoma(365.9)   . Unspecified sinusitis (chronic)     Patient Active Problem List   Diagnosis Date Noted  . S/P TAVR (transcatheter aortic valve replacement) 09/20/2018  . Permanent atrial fibrillation 09/20/2018  . Acute on chronic diastolic CHF (congestive heart failure) ([place]) 09/18/2018  . Heme positive stool 11/20/2017  . Gait disorder 03/24/2013  . Cervical post-laminectomy syndrome 03/26/2012  . Depression 03/26/2012  . Degenerative arthritis of shoulder region 03/26/2012  . KNEE REPLACEMENT, BILATERAL, HX OF 06/04/2007  . CELLULITIS/ABSCESS, ARM 05/05/2007  . ANXIETY 04/08/2007  . PAIN, CHRONIC NEC 04/08/2007  . OSTEOARTHROSIS, GENERALIZED, MULTIPLE SITES 04/08/2007  . SPONDYLOLISTHESIS 04/08/2007  . INCONTINENCE, URGE 04/08/2007  . HYPERCHOLESTEROLEMIA 03/07/2007  . HYPERTENSION, BENIGN 03/07/2007    Past Surgical History:  Procedure Laterality Date  . BILATERAL CATARACT SURGERY  2009   DR GROAT   . CARDIAC VALVE REPLACEMENT  05/2018  . CERVIACAL SPINE (4X)     DR MARK ROY   . COLONOSCOPY  2007   DR HENSEL   . COLONOSCOPY WITH PROPOFOL N/A 11/20/2017   Procedure: COLONOSCOPY WITH PROPOFOL;  Surgeon: Wilford Corner, MD;  Location: [place];  Service: Endoscopy;  Laterality: N/A;  . JOINT REPLACEMENT     both knees  . LEFT KNEE REPLACEMENT     DR Maureen Ralphs  . LEFT TRANSVERSE CARPAL LIGAMENT  01/06/2008  . ROTATOR CUFF LEFT SHOULDER  2001   DR MURPHY   . SHOULDER OPEN ROTATOR CUFF REPAIR  2006   DR [facility]  . SPINE SURGERY     neck fusion        Home Medications    Prior to Admission medications   Medication Sig Start Date End Date Taking? Authorizing Provider  aspirin EC 81 MG tablet Take 81 mg by mouth daily.    [provider]  buPROPion (WELLBUTRIN SR) 150 MG 12 hr tablet Take 150 mg by mouth 2 (two) times daily.     [provider]  buPROPion (WELLBUTRIN XL) 150 MG 24 hr tablet Take 150 mg by mouth daily.  09/11/18   [provider]  COLLAGEN PO Take 6 capsules by mouth daily.    [provider]  cyanocobalamin (,VITAMIN B-12,) 1000 MCG/ML injection Inject 1,000 mcg into the muscle every 30 (thirty) days. 07/24/18   [provider]  ELIQUIS 5 MG TABS tablet Take 5 mg by mouth 2 (two) times daily. 07/13/18   [provider]  fluticasone (FLONASE) 50 MCG/ACT nasal spray Place 1 spray into both nostrils daily. 07/30/18   [provider]  furosemide (LASIX) 20 MG tablet Take 2 tablets (40mg ) in AM and 1 tablet (20mg  in PM) 09/20/18   Strader, [place] M, PA-C  gabapentin (NEURONTIN) 300 MG capsule Take 300 mg  by mouth 3 (three) times daily. 08/10/18   [provider]  lisinopril (PRINIVIL,ZESTRIL) 30 MG tablet Take 30 mg by mouth daily.     [provider]  methocarbamol (ROBAXIN) 500 MG tablet Take 500 mg by mouth 2 (two) times daily. 08/19/18   [provider]  metoprolol succinate (TOPROL-XL) 25 MG 24 hr tablet Take 1 tablet (25 mg total) by mouth 2 (two) times daily. 09/20/18   Strader, Fransisco Hertz, PA-C  Misc Natural Products (BETA-SITOSTEROL PLANT STEROLS) CAPS Take 1 capsule by mouth daily.    [provider]  Omega-3 Fatty Acids (OMEGA 3 PO) Take 1 capsule by mouth 2 (two) times daily.    [provider]  omeprazole (PRILOSEC) 20 MG capsule TAKE 2 CAPSULES DAILY TO REDUCE STOMACH ACID AND HELP HEARTBURN Patient not taking: Reported on 09/18/2018 05/07/13   [name], MD  ondansetron (ZOFRAN) 8 MG tablet Take 8 mg by mouth every 8 (eight) hours as needed. for nausea 08/19/18   [provider]  OVER THE COUNTER MEDICATION Nitro beets otc supplement - 1 rounded teaspoon once daily    [provider]  oxyCODONE-acetaminophen (PERCOCET) 10-325 MG tablet Take 1 tablet by mouth 4 (four) times daily as needed for pain.     [provider]  polyethylene glycol (MIRALAX / GLYCOLAX) packet Take 17 g by  mouth daily.    [provider]  potassium chloride (K-DUR,KLOR-CON) 20 MEQ tablet Take 1 tablet (20 mEq total) by mouth daily. 09/20/18   Strader, Fransisco Hertz, PA-C  Potassium Chloride ER 20 MEQ TBCR Take 1 tablet by mouth daily. 09/20/18   [provider]  QUEtiapine (SEROQUEL) 25 MG tablet Take 25 mg by mouth at bedtime. 09/25/18   [provider]  rOPINIRole (REQUIP) 0.25 MG tablet Take 0.25 mg by mouth 2 (two) times daily. For restless legs 10/09/18   [provider]  rOPINIRole (REQUIP) 0.5 MG tablet Take 0.5 mg by mouth 2 (two) times daily.    [provider]  sildenafil (VIAGRA) 50 MG tablet Take 50 mg by mouth See admin instructions. Take one tablet (50 mg) by mouth as directed. 09/26/18   [provider]  silver sulfADIAZINE (SILVADENE) 1 % cream Apply 1 application topically 2 (two) times daily.    [provider]  simvastatin (ZOCOR) 40 MG tablet Take 40 mg by mouth at bedtime.    [provider]  topiramate (TOPAMAX) 25 MG capsule Take 25 mg by mouth 2 (two) times daily.     [provider]  topiramate (TOPAMAX) 25 MG tablet Take 25 mg by mouth 2 (two) times daily. 09/21/18   [provider]  TURMERIC PO Take 1-2 tablets by mouth daily.    [provider]    Family History Family History  Problem Relation Age of Onset  . Cancer Mother 30       cervical cancer  . Cancer Father 38       lung  cancer    Social History Social History   Tobacco Use  . Smoking status: Former Smoker    Last attempt to quit: 08/29/1985    Years since quitting: 33.1  . Smokeless tobacco: Former Systems developer    Quit date: 02/12/1985  Substance Use Topics  . Alcohol use: No  . Drug use: No     Allergies   Duloxetine; Pregabalin; and Zanaflex [tizanidine hcl]   Review of Systems Review of Systems  Constitutional: Positive for  activity change. Negative for chills and fever.  HENT: Negative for congestion  and facial swelling.   Eyes: Negative for discharge and visual disturbance.  Respiratory: Negative for shortness of breath.   Cardiovascular: Negative for chest pain and palpitations.  Gastrointestinal: Negative for abdominal pain, diarrhea and vomiting.  Musculoskeletal: Negative for arthralgias and myalgias.  Skin: Negative for color change and rash.  Neurological: Negative for tremors, syncope and headaches.  Psychiatric/Behavioral: Negative for confusion and dysphoric mood.     Physical Exam Updated Vital Signs BP 102/76   Pulse 99   Temp 98 F (36.7 C)   Resp 17   SpO2 99%   Physical Exam  Constitutional: He appears well-developed and well-nourished.  HENT:  Head: Normocephalic and atraumatic.  Eyes: Pupils are equal, round, and reactive to light. EOM are normal.  Neck: Normal range of motion. Neck supple. No JVD present.  Cardiovascular: Normal rate. An irregularly irregular rhythm present. Exam reveals no gallop and no friction rub.  No murmur heard. Pulmonary/Chest: No respiratory distress. He has no wheezes.  Abdominal: He exhibits no distension. There is no rebound and no guarding.  Musculoskeletal: Normal range of motion.  Neurological: He is alert.  Confused with scenario.    Skin: No rash noted. No pallor.  Psychiatric: He has a normal mood and affect. His behavior is normal.  Nursing note and vitals reviewed.    ED Treatments / Results  Labs (all labs ordered are listed, but only abnormal results are displayed) Labs Reviewed  COMPREHENSIVE METABOLIC PANEL - Abnormal; Notable for the following components:      Result Value   BUN 68 (*)    Creatinine, Ser 2.26 (*)    GFR calc non Af Amer 26 (*)    GFR calc Af Amer 31 (*)    All other components within normal limits  CBC WITH DIFFERENTIAL/PLATELET - Abnormal; Notable for the following components:   RBC 3.17 (*)    Hemoglobin 7.9 (*)    HCT 27.2 (*)    MCH 24.9 (*)    MCHC 29.0 (*)    RDW 23.0 (*)      Basophils Absolute 0.2 (*)    All other components within normal limits  PROTIME-INR - Abnormal; Notable for the following components:   Prothrombin Time 17.5 (*)    All other components within normal limits  I-STAT CHEM 8, ED - Abnormal; Notable for the following components:   BUN 64 (*)    Creatinine, Ser 2.40 (*)    Calcium, Ion 1.13 (*)    Hemoglobin 8.8 (*)    HCT 26.0 (*)    All other components within normal limits  POC OCCULT BLOOD, ED - Abnormal; Notable for the following components:   Fecal Occult Bld POSITIVE (*)    All other components within normal limits  URINE CULTURE  AMMONIA  ETHANOL  URINALYSIS, ROUTINE W REFLEX MICROSCOPIC  CBG MONITORING, ED  I-STAT CG4 LACTIC ACID, ED  I-STAT VENOUS BLOOD GAS, ED  TYPE AND SCREEN    EKG EKG Interpretation  Date/Time:  Monday October 13 2018 12:18:29 EDT Ventricular Rate:  100 PR Interval:    QRS Duration: 87 QT Interval:  329 QTC Calculation: 414 R Axis:   69 Text Interpretation:  Atrial fibrillation Ventricular premature complex Minimal ST depression Baseline wander in lead(s) II V5 No significant change since last tracing Confirmed by Deno Etienne [phone]) on 10/13/2018 12:37:08 PM   Radiology Dg Chest 2 View  Result Date:  10/13/2018 CLINICAL DATA:  Altered mental status EXAM: CHEST - 2 VIEW COMPARISON:  Chest x-ray of September 18, 2018 FINDINGS: The lungs are mildly hyperinflated. There is no focal infiltrate. There is no pleural effusion. The heart and pulmonary vascularity are normal. There is an ascending aortic stent graft in place. There is calcification in the wall of the aortic arch. There is multilevel degenerative disc disease of the thoracic spine. IMPRESSION: Chronic bronchitic changes.  No pneumonia nor CHF. Thoracic aortic atherosclerosis. Electronically Signed   By: David  Martinique M.D.   On: 10/13/2018 13:24   Ct Head Wo Contrast  Result Date: 10/13/2018 CLINICAL DATA:  Confusion with unsteady gait and  dizziness EXAM: CT HEAD WITHOUT CONTRAST TECHNIQUE: Contiguous axial images were obtained from the base of the skull through the vertex without intravenous contrast. COMPARISON:  March 20, 2013 FINDINGS: Brain: Moderate diffuse atrophy is stable. There is artifact in the posterior fossa due to postoperative metal fixation in the occipital region midline and toward the left. There is no demonstrable intracranial mass, hemorrhage, extra-axial fluid collection, or midline shift. There is mild small vessel disease in the centra semiovale bilaterally, stable. No acute infarct is demonstrable on this study. Vascular: There is no appreciable hyperdense vessel. There is calcification in each carotid siphon region and in the distal vertebral arteries bilaterally. Skull: Postoperative changes are noted involving the posterior occipital region, midline and toward the left. Bony calvarium otherwise appears intact. Sinuses/Orbits: There is mucosal thickening in several ethmoid air cells. Other visualized paranasal sinuses are clear. Frontal sinuses are hypoplastic. Orbits appear symmetric bilaterally. Other: Visualized mastoid air cells are clear. IMPRESSION: 1. There is artifact involving a portion of the posterior fossa due to artifact from the nearby metal in the midline and left occipital bone regions. No mass or hemorrhage evident. There is mild periventricular small vessel disease. No evident acute infarct. 2.  There are foci of arterial vascular calcification. 3. Postoperative change involving the bone of the midline and left occipital regions. No acute fracture evident. 4.  Mucosal thickening in several ethmoid air cells. Electronically Signed   By: Lowella Grip III M.D.   On: 10/13/2018 13:52    Procedures Procedures (including critical care time)  Medications Ordered in ED Medications  sodium chloride 0.9 % bolus 1,000 mL (1,000 mLs Intravenous New Bag/Given 10/13/18 1350)  pantoprazole (PROTONIX)  injection 40 mg (40 mg Intravenous Given 10/13/18 1457)     Initial Impression / Assessment and Plan / ED Course  I have reviewed the triage vital signs and the nursing notes.  Pertinent labs & imaging results that were available during my care of the patient were reviewed by me and considered in my medical decision making (see chart for details).     77 yo M with a cc of AMS.  Going on since yesterday, worsening today.  AKI on labwork.  Anemia down 2 Units from 3 weeks ago. Rectal exam with dark stool.  CT head negative.  Given protonix.  Will discuss with GI.  With ongoing mental status change and AKI, will admit.     I discussed the case with Dr. Penelope Coop.  He will evaluate the patient at bedside.  The patients results and plan were reviewed and discussed.   Any x-rays performed were independently reviewed by myself.   Differential diagnosis were considered with the presenting HPI.  Medications  sodium chloride 0.9 % bolus 1,000 mL (1,000 mLs Intravenous New Bag/Given 10/13/18 1350)  pantoprazole (PROTONIX) injection  40 mg (40 mg Intravenous Given 10/13/18 1457)    Vitals:   10/13/18 1300 10/13/18 1315 10/13/18 1345 10/13/18 1430  BP: (!) 79/56 121/65 113/81 102/76  Pulse:  100 (!) 105 99  Resp: 15 15 11 17   Temp: 98 F (36.7 C)     SpO2:  100% 100% 99%    Final diagnoses:  AKI (acute kidney injury) ([place])  Acute encephalopathy  Upper GI bleed    Admission/ observation were discussed with the admitting physician, patient and/or family and they are comfortable with the plan.    Final Clinical Impressions(s) / ED Diagnoses   Final diagnoses:  AKI (acute kidney injury) ([place])  Acute encephalopathy  Upper GI bleed    ED Discharge Orders    None       Deno Etienne, DO 10/13/18 1525

## 2018-10-13 NOTE — Consult Note (Signed)
Subjective:   HPI  The patient is a 77 year old male who presented to the emergency room with a complaint of feeling funny yesterday and today. He was going to go to the dentist today but because he was feeling funny came to the emergency room where he was evaluated. He was acting bizarre and seemed confused. He states that for the last couple of days he's noticed some dark black stools. He was checked in the ER where he was found to be heme positive and blood work was done noting a drop in hemoglobin and hematocrit from a few weeks before. He denies hematemesis. He denies abdominal pain. He had a colonoscopy in December 2018 by Dr. Michail Sermon with findings of 2 small polyps.. He has never had an ulcer to his knowledge. He does not seem confused to me at this time. I spoke to the ER physician. A CT of the head was done and no evidence of stroke seen. The etiology of this altered mental status is unclear. He has been on eliquis. He states he did not take a dose today but did take 1 yesterday.  Review of Systems Denies chest pain or shortness of breath  Past Medical History:  Diagnosis Date  . Cancer ([place])   . Carpal tunnel syndrome   . Cervical spondylosis with myelopathy   . Chronic pain syndrome   . Depression   . Difficult intubation    needs smaller tube; awake oral fiberoptic scope 8.0 ETT 03/30/08  . Dysphagia, pharyngeal phase   . Hemorrhage of gastrointestinal tract, unspecified   . History of cardiac cath    a. 03/2018 Cath: LAD and LCX with mild luminal irregularities.  No significant disease.  . Iron deficiency anemia, unspecified   . Lumbosacral spondylosis without myelopathy   . Muscle weakness (generalized)   . Other and unspecified disc disorder of cervical region   . Other and unspecified hyperlipidemia   . Patent foramen ovale    a. 05/2018 Echo: post-op TAVR--> + bublble study w/ L->R shunt.  . Permanent atrial fibrillation    a.  Diagnosed in the spring 2019.  Had rapid  atrial fibrillation following TAVR and has been rate controlled with beta-blocker.  CHA2DS2VASc equals 7.  Supposed to be on Eliquis.  . Primary localized osteoarthrosis, lower leg   . Primary localized osteoarthrosis, shoulder region   . Reflux esophagitis   . Severe aortic stenosis    a. mild-mod AS by 01/2016 TTE, restricted AV opening with mod AR 01/2016 TEE; b. 10/2017 Echo: EF 60-65%, sev Ca2+ AoV, mean grad 53mmHg; c. 05/2018 s/p TAVR ([facility]); d. 06/2018 Echo: EF 55-60%, well-seated prosth AoV, peak velocity 156cm/s. AoV gradient 46mmHg.  Marland Kitchen Spinal stenosis, unspecified region other than cervical   . Stroke ([place])    01/2016  . Syncope and collapse   . Unspecified arthropathy, lower leg   . Unspecified constipation   . Unspecified essential hypertension   . Unspecified glaucoma(365.9)    Past Surgical History:  Procedure Laterality Date  . BILATERAL CATARACT SURGERY  2009   DR GROAT   . CARDIAC VALVE REPLACEMENT  05/2018  . CERVIACAL SPINE (4X)     DR MARK ROY   . COLONOSCOPY  2007   DR HENSEL   . COLONOSCOPY WITH PROPOFOL N/A 11/20/2017   Procedure: COLONOSCOPY WITH PROPOFOL;  Surgeon: Wilford Corner, MD;  Location: [facility];  Service: Endoscopy;  Laterality: N/A;  . JOINT REPLACEMENT     both knees  .  LEFT KNEE REPLACEMENT     DR Maureen Ralphs  . LEFT TRANSVERSE CARPAL LIGAMENT  01/06/2008  . ROTATOR CUFF LEFT SHOULDER  2001   DR MURPHY   . SHOULDER OPEN ROTATOR CUFF REPAIR  2006   DR [facility]  . SPINE SURGERY     neck fusion   Social History   Socioeconomic History  . Marital status: Single    Spouse name: Not on file  . Number of children: 0  . Years of education: GED  . Highest education level: Not on file  Occupational History  . Occupation: retired    Comment: 10 years in service, [employer]  . Financial resource strain: Not on file  . Food insecurity:    Worry: Not on file    Inability: Not on file  . Transportation needs:    Medical: Not on  file    Non-medical: Not on file  Tobacco Use  . Smoking status: Former Smoker    Last attempt to quit: 08/29/1985    Years since quitting: 33.1  . Smokeless tobacco: Former Systems developer    Quit date: 02/12/1985  Substance and Sexual Activity  . Alcohol use: No  . Drug use: No  . Sexual activity: Not on file  Lifestyle  . Physical activity:    Days per week: Not on file    Minutes per session: Not on file  . Stress: Not on file  Relationships  . Social connections:    Talks on phone: Not on file    Gets together: Not on file    Attends religious service: Not on file    Active member of club or organization: Not on file    Attends meetings of clubs or organizations: Not on file    Relationship status: Not on file  . Intimate partner violence:    Fear of current or ex partner: Not on file    Emotionally abused: Not on file    Physically abused: Not on file    Forced sexual activity: Not on file  Other Topics Concern  . Not on file  Social History Narrative   Lives alone   caffeine use - none   family history includes Cancer (age of onset: 23) in his mother; Cancer (age of onset: 45) in his father. No current facility-administered medications for this encounter.   Current Outpatient Medications:  .  apixaban (ELIQUIS) 5 MG TABS tablet, Take 5 mg by mouth 2 (two) times daily., Disp: , Rfl:  .  aspirin EC 81 MG tablet, Take 81 mg by mouth daily., Disp: , Rfl:  .  buPROPion (WELLBUTRIN XL) 150 MG 24 hr tablet, Take 150 mg by mouth daily., Disp: , Rfl: 3 .  COLLAGEN PO, Take 6 capsules by mouth daily., Disp: , Rfl:  .  cyanocobalamin (,VITAMIN B-12,) 1000 MCG/ML injection, Inject 1,000 mcg into the muscle every 30 (thirty) days., Disp: , Rfl: 11 .  fluticasone (FLONASE) 50 MCG/ACT nasal spray, Place 1 spray into both nostrils daily., Disp: , Rfl: 2 .  furosemide (LASIX) 20 MG tablet, Take 2 tablets (40mg ) in AM and 1 tablet (20mg  in PM), Disp: 90 tablet, Rfl: 5 .  gabapentin (NEURONTIN)  300 MG capsule, Take 300 mg by mouth 3 (three) times daily., Disp: , Rfl: 11 .  lisinopril (PRINIVIL,ZESTRIL) 30 MG tablet, Take 30 mg by mouth daily. , Disp: , Rfl:  .  methocarbamol (ROBAXIN) 500 MG tablet, Take 500 mg by mouth 2 (two)  times daily., Disp: , Rfl: 0 .  metoprolol succinate (TOPROL-XL) 25 MG 24 hr tablet, Take 1 tablet (25 mg total) by mouth 2 (two) times daily., Disp: 60 tablet, Rfl: 5 .  Misc Natural Products (BETA-SITOSTEROL PLANT STEROLS) CAPS, Take 1 capsule by mouth daily., Disp: , Rfl:  .  Omega-3 Fatty Acids (OMEGA 3 PO), Take 1 capsule by mouth 2 (two) times daily., Disp: , Rfl:  .  omeprazole (PRILOSEC) 20 MG capsule, TAKE 2 CAPSULES DAILY TO REDUCE STOMACH ACID AND HELP HEARTBURN (Patient not taking: Reported on 09/18/2018), Disp: 60 capsule, Rfl: 6 .  ondansetron (ZOFRAN) 8 MG tablet, Take 8 mg by mouth every 8 (eight) hours as needed. for nausea, Disp: , Rfl: 0 .  OVER THE COUNTER MEDICATION, Nitro beets otc supplement - 1 rounded teaspoon once daily, Disp: , Rfl:  .  oxyCODONE-acetaminophen (PERCOCET) 10-325 MG tablet, Take 1 tablet by mouth 4 (four) times daily as needed for pain. , Disp: , Rfl:  .  polyethylene glycol (MIRALAX / GLYCOLAX) packet, Take 17 g by mouth daily., Disp: , Rfl:  .  potassium chloride (K-DUR,KLOR-CON) 20 MEQ tablet, Take 1 tablet (20 mEq total) by mouth daily., Disp: 30 tablet, Rfl: 5 .  Potassium Chloride ER 20 MEQ TBCR, Take 1 tablet by mouth daily., Disp: , Rfl: 5 .  QUEtiapine (SEROQUEL) 25 MG tablet, Take 25 mg by mouth at bedtime., Disp: , Rfl: 3 .  rOPINIRole (REQUIP) 0.25 MG tablet, Take 0.25 mg by mouth 2 (two) times daily. For restless legs, Disp: , Rfl: 3 .  rOPINIRole (REQUIP) 0.5 MG tablet, Take 0.5 mg by mouth 2 (two) times daily., Disp: , Rfl:  .  sildenafil (VIAGRA) 50 MG tablet, Take 50 mg by mouth See admin instructions. Take one tablet (50 mg) by mouth as directed., Disp: , Rfl: 0 .  silver sulfADIAZINE (SILVADENE) 1 % cream,  Apply 1 application topically 2 (two) times daily., Disp: , Rfl:  .  simvastatin (ZOCOR) 40 MG tablet, Take 40 mg by mouth at bedtime., Disp: , Rfl:  .  topiramate (TOPAMAX) 25 MG capsule, Take 25 mg by mouth 2 (two) times daily. , Disp: , Rfl:  .  topiramate (TOPAMAX) 25 MG tablet, Take 25 mg by mouth 2 (two) times daily., Disp: , Rfl: 3 .  TURMERIC PO, Take 1-2 tablets by mouth daily., Disp: , Rfl:  Allergies  Allergen Reactions  . Duloxetine Other (See Comments)    Increased depression, irritability  . Pregabalin Other (See Comments)    Flashbacks, Bad dreams, Aggressive behavior, swelling  . Zanaflex [Tizanidine Hcl] Anxiety     Objective:     BP 102/76   Pulse 99   Temp 98 F (36.7 C)   Resp 17   SpO2 99%   No distress  He is alert  He does not seem confused talking to him right now.  Heart regular no murmurs heard  Lungs clear  Abdomen soft and nontender  Laboratory No components found for: D1    Assessment:     Change in mental status etiology unclear  Melena  Anemia        Plan:     The patient is going to be admitted to the medicine service. Further evaluation of mental status will be carried out. From a GI standpoint given the melena and drop in hemoglobin and hematocrit I would recommend doing an EGD. I talked to the patient about this. We will plan to do  this tomorrow. Hold Eliquis. Lab Results  Component Value Date   HGB 8.8 (L) 10/13/2018   HGB 7.9 (L) 10/13/2018   HGB 11.1 (L) 09/18/2018   HCT 26.0 (L) 10/13/2018   HCT 27.2 (L) 10/13/2018   HCT 37.8 (L) 09/18/2018   ALKPHOS 56 10/13/2018   ALKPHOS 100 03/12/2013   ALKPHOS 70 04/14/2008   AST 23 10/13/2018   AST 23 03/12/2013   AST 21 04/14/2008   ALT 17 10/13/2018   ALT 17 03/12/2013   ALT 38 04/14/2008

## 2018-10-13 NOTE — ED Notes (Signed)
Pt transported to floor on TELE, all belongings taken with patient to floor.  Patient's firearm is stored with security.  Belongings slip is in bag, reference number: [id number]

## 2018-10-13 NOTE — ED Notes (Signed)
Pt transported to xray 

## 2018-10-13 NOTE — H&P (Signed)
History and Physical    TOMIO KIRK JQB:[id number] DOB: 06/04/1941 DOA: 10/13/2018  PCP: Delorise Shiner, MD Consultants:  Michail Sermon - GI Patient coming from:  Home  Chief Complaint: AMS  HPI: [name] is a 77 y.o. male with medical history significant of glaucoma; HTN; syncope; CVA; severe AS s/p TAVR; PFO; afib on Eliquis; and chronic pain presenting with AMS.  He reports feeling confused and fuzzy headed for several days.  He is able to report that he went to his dentist today.  He denies pain, specifically chest and abdominal pain.  He acknowledges dark bloody stools for about 2 days.  He is unable to provide additional history due to ongoing AMS.   ED Course:  Uncertain baseline cognition.  Confused for a few days.  "felt funny", dark stools.  He parked his car funny, went to his dentist and was odd and they sent him here.  He had a gun with him.  Dark stools, AKI, Hgb decreased.  Has not given any Narcan but will give.  GI, Dr. Penelope Coop, to see.    Given IV Protonix 40 mg and IVF.  Review of Systems: As per HPI; otherwise review of systems reviewed and negative.  This is likely inaccurate due to his AMS.  PMH, PSH, FH, and SH were reviewed in Epic  Past Medical History:  Diagnosis Date  . Cancer ([place])   . Carpal tunnel syndrome   . Cervical spondylosis with myelopathy   . Chronic pain syndrome   . Depression   . Difficult intubation    needs smaller tube; awake oral fiberoptic scope 8.0 ETT 03/30/08  . Dysphagia, pharyngeal phase   . Hemorrhage of gastrointestinal tract, unspecified   . History of cardiac cath    a. 03/2018 Cath: LAD and LCX with mild luminal irregularities.  No significant disease.  . Iron deficiency anemia, unspecified   . Lumbosacral spondylosis without myelopathy   . Muscle weakness (generalized)   . Other and unspecified disc disorder of cervical region   . Other and unspecified hyperlipidemia   . Patent foramen ovale    a. 05/2018 Echo: post-op  TAVR--> + bublble study w/ L->R shunt.  . Permanent atrial fibrillation    a.  Diagnosed in the spring 2019.  Had rapid atrial fibrillation following TAVR and has been rate controlled with beta-blocker.  CHA2DS2VASc equals 7.  Supposed to be on Eliquis.  . Primary localized osteoarthrosis, lower leg   . Primary localized osteoarthrosis, shoulder region   . Reflux esophagitis   . Severe aortic stenosis    a. mild-mod AS by 01/2016 TTE, restricted AV opening with mod AR 01/2016 TEE; b. 10/2017 Echo: EF 60-65%, sev Ca2+ AoV, mean grad 53mmHg; c. 05/2018 s/p TAVR ([facility]); d. 06/2018 Echo: EF 55-60%, well-seated prosth AoV, peak velocity 156cm/s. AoV gradient 54mmHg.  Marland Kitchen Spinal stenosis, unspecified region other than cervical   . Stroke ([place])    01/2016  . Syncope and collapse   . Unspecified arthropathy, lower leg   . Unspecified constipation   . Unspecified essential hypertension   . Unspecified glaucoma(365.9)     Past Surgical History:  Procedure Laterality Date  . BILATERAL CATARACT SURGERY  2009   DR GROAT   . CARDIAC VALVE REPLACEMENT  05/2018  . CERVIACAL SPINE (4X)     DR MARK ROY   . COLONOSCOPY  2007   DR HENSEL   . COLONOSCOPY WITH PROPOFOL N/A 11/20/2017   Procedure: COLONOSCOPY WITH PROPOFOL;  Surgeon: Wilford Corner, MD;  Location: [place];  Service: Endoscopy;  Laterality: N/A;  . JOINT REPLACEMENT     both knees  . LEFT KNEE REPLACEMENT     DR Maureen Ralphs  . LEFT TRANSVERSE CARPAL LIGAMENT  01/06/2008  . ROTATOR CUFF LEFT SHOULDER  2001   DR MURPHY   . SHOULDER OPEN ROTATOR CUFF REPAIR  2006   DR [facility]  . SPINE SURGERY     neck fusion    Social History   Socioeconomic History  . Marital status: Single    Spouse name: Not on file  . Number of children: 0  . Years of education: GED  . Highest education level: Not on file  Occupational History  . Occupation: retired    Comment: 10 years in service, [employer]  . Financial resource strain: Not  on file  . Food insecurity:    Worry: Not on file    Inability: Not on file  . Transportation needs:    Medical: Not on file    Non-medical: Not on file  Tobacco Use  . Smoking status: Former Smoker    Last attempt to quit: 08/29/1985    Years since quitting: 33.1  . Smokeless tobacco: Former Systems developer    Quit date: 02/12/1985  Substance and Sexual Activity  . Alcohol use: No  . Drug use: No  . Sexual activity: Not on file  Lifestyle  . Physical activity:    Days per week: Not on file    Minutes per session: Not on file  . Stress: Not on file  Relationships  . Social connections:    Talks on phone: Not on file    Gets together: Not on file    Attends religious service: Not on file    Active member of club or organization: Not on file    Attends meetings of clubs or organizations: Not on file    Relationship status: Not on file  . Intimate partner violence:    Fear of current or ex partner: Not on file    Emotionally abused: Not on file    Physically abused: Not on file    Forced sexual activity: Not on file  Other Topics Concern  . Not on file  Social History Narrative   Lives alone   caffeine use - none    Allergies  Allergen Reactions  . Duloxetine Other (See Comments)    Increased depression, irritability  . Pregabalin Other (See Comments)    Flashbacks, Bad dreams, Aggressive behavior, swelling  . Zanaflex [Tizanidine Hcl] Anxiety    Family History  Problem Relation Age of Onset  . Cancer Mother 30       cervical cancer  . Cancer Father 33       lung  cancer    Prior to Admission medications   Medication Sig Start Date End Date Taking? Authorizing Provider  aspirin EC 81 MG tablet Take 81 mg by mouth daily.    [provider]  buPROPion (WELLBUTRIN SR) 150 MG 12 hr tablet Take 150 mg by mouth 2 (two) times daily.     [provider]  buPROPion (WELLBUTRIN XL) 150 MG 24 hr tablet Take 150 mg by mouth daily. 09/11/18   [provider]  COLLAGEN PO Take 6 capsules by mouth daily.    [provider]  cyanocobalamin (,VITAMIN B-12,) 1000 MCG/ML injection Inject 1,000 mcg into the muscle every 30 (thirty) days. 07/24/18   [provider]  ELIQUIS 5 MG TABS tablet Take 5 mg by mouth 2 (two) times daily. 07/13/18   [provider]  fluticasone (FLONASE) 50 MCG/ACT nasal spray Place 1 spray into both nostrils daily. 07/30/18   [provider]  furosemide (LASIX) 20 MG tablet Take 2 tablets (40mg ) in AM and 1 tablet (20mg  in PM) 09/20/18   Strader, [place] M, PA-C  gabapentin (NEURONTIN) 300 MG capsule Take 300 mg by mouth 3 (three) times daily. 08/10/18   [provider]  lisinopril (PRINIVIL,ZESTRIL) 30 MG tablet Take 30 mg by mouth daily.     [provider]  methocarbamol (ROBAXIN) 500 MG tablet Take 500 mg by mouth 2 (two) times daily. 08/19/18   [provider]  metoprolol succinate (TOPROL-XL) 25 MG 24 hr tablet Take 1 tablet (25 mg total) by mouth 2 (two) times daily. 09/20/18   Strader, Fransisco Hertz, PA-C  Misc Natural Products (BETA-SITOSTEROL PLANT STEROLS) CAPS Take 1 capsule by mouth daily.    [provider]  Omega-3 Fatty Acids (OMEGA 3 PO) Take 1 capsule by mouth 2 (two) times daily.    [provider]  omeprazole (PRILOSEC) 20 MG capsule TAKE 2 CAPSULES DAILY TO REDUCE STOMACH ACID AND HELP HEARTBURN Patient not taking: Reported on 09/18/2018 05/07/13   [name], MD  ondansetron (ZOFRAN) 8 MG tablet Take 8 mg by mouth every 8 (eight) hours as needed. for nausea 08/19/18   [provider]  OVER THE COUNTER MEDICATION Nitro beets otc supplement - 1 rounded teaspoon once daily    [provider]  oxyCODONE-acetaminophen (PERCOCET) 10-325 MG tablet Take 1 tablet by mouth 4 (four) times daily as needed for pain.     [provider]  polyethylene glycol (MIRALAX / GLYCOLAX) packet Take 17 g by mouth daily.    [provider]  potassium chloride (K-DUR,KLOR-CON) 20 MEQ tablet Take 1 tablet (20 mEq total) by mouth daily. 09/20/18   Strader, Fransisco Hertz, PA-C  Potassium Chloride ER 20 MEQ TBCR Take 1 tablet by mouth daily. 09/20/18   [provider]  QUEtiapine (SEROQUEL) 25 MG tablet Take 25 mg by mouth at bedtime. 09/25/18   [provider]  rOPINIRole (REQUIP) 0.25 MG tablet Take 0.25 mg by mouth 2 (two) times daily. For restless legs 10/09/18   [provider]  rOPINIRole (REQUIP) 0.5 MG tablet Take 0.5 mg by mouth 2 (two) times daily.    [provider]  sildenafil (VIAGRA) 50 MG tablet Take 50 mg by mouth See admin instructions. Take one tablet (50 mg) by mouth as directed. 09/26/18   [provider]  silver sulfADIAZINE (SILVADENE) 1 % cream Apply 1 application topically 2 (two) times daily.    [provider]  simvastatin (ZOCOR) 40 MG tablet Take 40 mg by mouth at bedtime.    [provider]  topiramate (TOPAMAX) 25 MG capsule Take 25 mg by mouth 2 (two) times daily.     [provider]  topiramate (TOPAMAX) 25 MG tablet Take 25 mg by mouth 2 (two) times daily. 09/21/18   [provider]  TURMERIC PO Take 1-2 tablets by mouth daily.    [provider]    Physical Exam: Vitals:   10/13/18 1345 10/13/18 1430 10/13/18 1515 10/13/18 1544  BP: 113/81 102/76 123/72 93/80  Pulse: (!) 105 99 (!) 29 (!) 102  Resp: 11 17 19 20   Temp:      SpO2: 100%  99% (!) 72% 97%     General:  Appears calm and comfortable and is NAD Eyes:  EOMI, normal lids, iris ENT:  grossly normal hearing, lips & tongue, mmm; appropriate dentition Neck:  no LAD, masses or thyromegaly Cardiovascular:  Irregularly irregular, rate slightly tachycardic, no m/r/g. No LE edema.  Respiratory:   CTA bilaterally with no wheezes/rales/rhonchi.  Normal respiratory effort. Abdomen:  soft, NT, ND, NABS Skin:  no rash or induration seen on limited  exam Musculoskeletal:  grossly normal tone BUE/BLE, good ROM, no bony abnormality Psychiatric:  Somewhat unusual mood and affect, speech halting and uncertain, AOx2 Neurologic:  CN 2-12 grossly intact, moves all extremities in coordinated fashion, sensation intact - possibly inaccurate due to AMS    Radiological Exams on Admission: Dg Chest 2 View  Result Date: 10/13/2018 CLINICAL DATA:  Altered mental status EXAM: CHEST - 2 VIEW COMPARISON:  Chest x-ray of September 18, 2018 FINDINGS: The lungs are mildly hyperinflated. There is no focal infiltrate. There is no pleural effusion. The heart and pulmonary vascularity are normal. There is an ascending aortic stent graft in place. There is calcification in the wall of the aortic arch. There is multilevel degenerative disc disease of the thoracic spine. IMPRESSION: Chronic bronchitic changes.  No pneumonia nor CHF. Thoracic aortic atherosclerosis. Electronically Signed   By: David  Martinique M.D.   On: 10/13/2018 13:24   Ct Head Wo Contrast  Result Date: 10/13/2018 CLINICAL DATA:  Confusion with unsteady gait and dizziness EXAM: CT HEAD WITHOUT CONTRAST TECHNIQUE: Contiguous axial images were obtained from the base of the skull through the vertex without intravenous contrast. COMPARISON:  March 20, 2013 FINDINGS: Brain: Moderate diffuse atrophy is stable. There is artifact in the posterior fossa due to postoperative metal fixation in the occipital region midline and toward the left. There is no demonstrable intracranial mass, hemorrhage, extra-axial fluid collection, or midline shift. There is mild small vessel disease in the centra semiovale bilaterally, stable. No acute infarct is demonstrable on this study. Vascular: There is no appreciable hyperdense vessel. There is calcification in each carotid siphon region and in the distal vertebral arteries bilaterally. Skull: Postoperative changes are noted involving the posterior occipital region, midline and toward  the left. Bony calvarium otherwise appears intact. Sinuses/Orbits: There is mucosal thickening in several ethmoid air cells. Other visualized paranasal sinuses are clear. Frontal sinuses are hypoplastic. Orbits appear symmetric bilaterally. Other: Visualized mastoid air cells are clear. IMPRESSION: 1. There is artifact involving a portion of the posterior fossa due to artifact from the nearby metal in the midline and left occipital bone regions. No mass or hemorrhage evident. There is mild periventricular small vessel disease. No evident acute infarct. 2.  There are foci of arterial vascular calcification. 3. Postoperative change involving the bone of the midline and left occipital regions. No acute fracture evident. 4.  Mucosal thickening in several ethmoid air cells. Electronically Signed   By: Lowella Grip III M.D.   On: 10/13/2018 13:52    EKG: Independently reviewed.  Afib with rate 100; nonspecific ST changes with no evidence of acute ischemia   Labs on Admission: I have personally reviewed the available labs and imaging studies at the time of the admission.  Pertinent labs:   VBG: 7.284/52.7  INR 1.45 Heme positive BUN 68/Creatinine 2.26/GFR 26; 29/1.04/>60 on 10/5 Lactate 1.49 WBC 7.9 Hgb 7.9; 11.1 on 10/3 UA: trace LE; rare bacteria ETOH <10  Assessment/Plan Principal Problem:   GI bleeding Active Problems:  HYPERCHOLESTEROLEMIA   HYPERTENSION, BENIGN   Depression   S/P TAVR (transcatheter aortic valve replacement)   Permanent atrial fibrillation   Acute metabolic encephalopathy   Acute renal failure (ARF) (HCC)   GI bleeding -Patient's confusion may be caused by hypoperfusion due to anemia secondary to upper GI bleeding.  -Patient has history of hemorrhoids on C-scope in 12/18 but does not appear to have had a prior EGD -Most likely UGI bleeding due to dark stools, but he has no abdominal TTP; possible small bowel etiology, as well  -His Hgb decreased from 11.1 on  10/3 to 7.9 today.  -The patient is currently mildly tachycardic with soft blood pressure, suggesting acute volume loss.  -Type and screen ordered, will trend CBC - will admit to tele bed - GI consulted by ED, and Dr. Penelope Coop has seen the patient - EGD planned for tomorrow AM - Will allow patient to have clear liquids today - LR at 125 mL/hr - Start IV pantoprazole 40 mg bid - Zofran IV for nausea - Avoid NSAIDs and SQ heparin - Maintain IV access (2 large bore IVs if possible). - Transfuse as needed to maintain Hgb >7  Acute encephalopathy -Possibly due to hypoperfusion in the setting of anemia/GIB and/or uremia -Another possible etiology is overmedication - the patient has chronic pain and takes Percocet 10/325 q6h for this issue; I have asked Dr. Tyrone Nine to administer Narcan to see if this improves her mental status -CT without acute changes -Will monitor with ongoing hydration -Will continue Percocet but space to q8h prn severe pain -He also has known depression and his baseline is not known at this time  Acute renal failure -Normal baseline renal function -Current creatinine is 2.26, GFR 26 -Suspect prerenal azotemia in the setting of GI bleed -Will hydrate and follow -Hold Lisinopril  HTN -Hold Lisinopril -resume Toprol tonight, if BP will support   HLD -Continue Zocor  Afib, on AC; also s/p TAVR -Hold Eliquis in the setting of GI bleeding -Rate controlled with Toprol, will continue  Depression -Continue Wellbutrin, Seroquel, Topamax  DVT prophylaxis: SCDs Code Status:  Full  Family Communication: None present  Disposition Plan:  Home once clinically improved Consults called: GI  Admission status: Admit - It is my clinical opinion that admission to INPATIENT is reasonable and necessary because of the expectation that this patient will require hospital care that crosses at least 2 midnights to treat this condition based on the medical complexity of the problems  presented.  Given the aforementioned information, the predictability of an adverse outcome is felt to be significant.     Karmen Bongo MD [facility]  If note is complete, please contact covering daytime or nighttime physician. [email or web address] Password [email or web address]  10/13/2018, 4:14 PM

## 2018-10-14 ENCOUNTER — Encounter (HOSPITAL_COMMUNITY)
Admission: EM | Disposition: A | Discharge status: Home Health | Payer: Self-pay | Source: Home / Self Care | Attending: Internal Medicine

## 2018-10-14 ENCOUNTER — Inpatient Hospital Stay (HOSPITAL_COMMUNITY): Payer: Medicare Other | Admitting: Certified Registered Nurse Anesthetist

## 2018-10-14 ENCOUNTER — Encounter (HOSPITAL_COMMUNITY): Payer: Self-pay

## 2018-10-14 DIAGNOSIS — I4821 Permanent atrial fibrillation: Secondary | ICD-10-CM

## 2018-10-14 DIAGNOSIS — G9341 Metabolic encephalopathy: Secondary | ICD-10-CM

## 2018-10-14 DIAGNOSIS — I10 Essential (primary) hypertension: Secondary | ICD-10-CM

## 2018-10-14 DIAGNOSIS — K922 Gastrointestinal hemorrhage, unspecified: Secondary | ICD-10-CM

## 2018-10-14 DIAGNOSIS — N179 Acute kidney failure, unspecified: Secondary | ICD-10-CM

## 2018-10-14 DIAGNOSIS — Z952 Presence of prosthetic heart valve: Secondary | ICD-10-CM

## 2018-10-14 HISTORY — PX: BIOPSY: SHX5522

## 2018-10-14 HISTORY — PX: ESOPHAGOGASTRODUODENOSCOPY (EGD) WITH PROPOFOL: SHX5813

## 2018-10-14 LAB — HEMOGLOBIN AND HEMATOCRIT, BLOOD
HCT: 32.9 % — ABNORMAL LOW (ref 39.0–52.0)
HCT: 32.4 % — ABNORMAL LOW (ref 39.0–52.0)
HEMOGLOBIN: 9.9 g/dL — AB (ref 13.0–17.0)
Hemoglobin: 9.6 g/dL — ABNORMAL LOW (ref 13.0–17.0)

## 2018-10-14 LAB — TYPE AND SCREEN
ABO/RH(D): A POS
Antibody Screen: NEGATIVE
UNIT DIVISION: 0
UNIT DIVISION: 0

## 2018-10-14 LAB — CBC
HCT: 23.6 % — ABNORMAL LOW (ref 39.0–52.0)
HEMOGLOBIN: 6.9 g/dL — AB (ref 13.0–17.0)
MCH: 24.5 pg — ABNORMAL LOW (ref 26.0–34.0)
MCHC: 29.2 g/dL — AB (ref 30.0–36.0)
MCV: 83.7 fL (ref 80.0–100.0)
Platelets: 258 10*3/uL (ref 150–400)
RBC: 2.82 MIL/uL — AB (ref 4.22–5.81)
RDW: 22.4 % — ABNORMAL HIGH (ref 11.5–15.5)
WBC: 6.5 10*3/uL (ref 4.0–10.5)
nRBC: 0 % (ref 0.0–0.2)

## 2018-10-14 LAB — BASIC METABOLIC PANEL
ANION GAP: 6 (ref 5–15)
BUN: 43 mg/dL — AB (ref 8–23)
CO2: 25 mmol/L (ref 22–32)
Calcium: 8.8 mg/dL — ABNORMAL LOW (ref 8.9–10.3)
Chloride: 112 mmol/L — ABNORMAL HIGH (ref 98–111)
Creatinine, Ser: 1.49 mg/dL — ABNORMAL HIGH (ref 0.61–1.24)
GFR calc Af Amer: 51 mL/min — ABNORMAL LOW (ref >60)
GFR calc non Af Amer: 44 mL/min — ABNORMAL LOW (ref >60)
Glucose, Bld: 78 mg/dL (ref 70–99)
POTASSIUM: 4.5 mmol/L (ref 3.5–5.1)
Sodium: 143 mmol/L (ref 135–145)

## 2018-10-14 LAB — BPAM RBC
Blood Product Expiration Date: 201911222359
Blood Product Expiration Date: 201911272359
ISSUE DATE / TIME: 201910262153
UNIT TYPE AND RH: 6200
Unit Type and Rh: 6200

## 2018-10-14 LAB — PREPARE RBC (CROSSMATCH)

## 2018-10-14 LAB — URINE CULTURE: Culture: NO GROWTH

## 2018-10-14 SURGERY — ESOPHAGOGASTRODUODENOSCOPY (EGD) WITH PROPOFOL
Anesthesia: Monitor Anesthesia Care

## 2018-10-14 MED ORDER — SODIUM CHLORIDE 0.9 % IV SOLN: INTRAVENOUS | Status: DC

## 2018-10-14 MED ORDER — HALOPERIDOL LACTATE 5 MG/ML IJ SOLN
2.5000 mg | Freq: Once | INTRAMUSCULAR | Status: AC
  Administered 2018-10-14: 2.5 mg via INTRAVENOUS
  Filled 2018-10-14: qty 1

## 2018-10-14 MED ORDER — DIPHENHYDRAMINE HCL 50 MG/ML IJ SOLN
25.0000 mg | Freq: Four times a day (QID) | INTRAMUSCULAR | Status: DC | PRN
  Administered 2018-10-14: 25 mg via INTRAVENOUS
  Filled 2018-10-14 (×2): qty 1

## 2018-10-14 MED ORDER — METOPROLOL TARTRATE 5 MG/5ML IV SOLN: 5.0000 mg | INTRAVENOUS | Status: DC | PRN

## 2018-10-14 MED ORDER — PROPOFOL 10 MG/ML IV BOLUS
INTRAVENOUS | Status: DC | PRN
  Administered 2018-10-14: 20 mg via INTRAVENOUS

## 2018-10-14 MED ORDER — SODIUM CHLORIDE 0.9% IV SOLUTION: Freq: Once | INTRAVENOUS | Status: DC

## 2018-10-14 MED ORDER — FENTANYL CITRATE (PF) 100 MCG/2ML IJ SOLN
INTRAMUSCULAR | Status: DC | PRN
  Administered 2018-10-14 (×2): 25 ug via INTRAVENOUS

## 2018-10-14 MED ORDER — PANTOPRAZOLE SODIUM 40 MG IV SOLR
40.0000 mg | Freq: Two times a day (BID) | INTRAVENOUS | Status: DC
  Administered 2018-10-14 – 2018-10-16 (×5): 40 mg via INTRAVENOUS
  Filled 2018-10-14 (×5): qty 40

## 2018-10-14 MED ORDER — FUROSEMIDE 10 MG/ML IJ SOLN: 20.0000 mg | Freq: Once | INTRAMUSCULAR | Status: DC

## 2018-10-14 MED ORDER — LACTATED RINGERS IV SOLN
INTRAVENOUS | Status: DC
  Administered 2018-10-14: 1000 mL via INTRAVENOUS
  Administered 2018-10-14: 11:00:00 via INTRAVENOUS

## 2018-10-14 MED ORDER — METOPROLOL TARTRATE 5 MG/5ML IV SOLN
INTRAVENOUS | Status: AC
  Filled 2018-10-14: qty 5

## 2018-10-14 MED ORDER — PROPOFOL 500 MG/50ML IV EMUL
INTRAVENOUS | Status: DC | PRN
  Administered 2018-10-14: 50 ug/kg/min via INTRAVENOUS

## 2018-10-14 MED ORDER — FUROSEMIDE 10 MG/ML IJ SOLN
40.0000 mg | Freq: Once | INTRAMUSCULAR | Status: AC
  Administered 2018-10-14: 40 mg via INTRAVENOUS
  Filled 2018-10-14: qty 4

## 2018-10-14 MED ORDER — HALOPERIDOL LACTATE 5 MG/ML IJ SOLN
2.0000 mg | Freq: Four times a day (QID) | INTRAMUSCULAR | Status: DC | PRN
  Administered 2018-10-15: 2 mg via INTRAVENOUS
  Filled 2018-10-14 (×2): qty 1

## 2018-10-14 MED ORDER — LORAZEPAM 2 MG/ML IJ SOLN
1.0000 mg | Freq: Once | INTRAMUSCULAR | Status: AC
  Administered 2018-10-14: 1 mg via INTRAVENOUS
  Filled 2018-10-14: qty 1

## 2018-10-14 MED ORDER — OXYCODONE-ACETAMINOPHEN 5-325 MG PO TABS
1.0000 | ORAL_TABLET | Freq: Four times a day (QID) | ORAL | Status: DC | PRN
  Administered 2018-10-14 – 2018-10-15 (×2): 1 via ORAL
  Filled 2018-10-14 (×2): qty 1

## 2018-10-14 SURGICAL SUPPLY — 15 items

## 2018-10-14 NOTE — Progress Notes (Signed)
MDA at bedside giving verbal orders for blood transfusion rate.

## 2018-10-14 NOTE — Transfer of Care (Signed)
Immediate Anesthesia Transfer of Care Note  Patient: [name]  Procedure(s) Performed: ESOPHAGOGASTRODUODENOSCOPY (EGD) WITH PROPOFOL (N/A ) BIOPSY  Patient Location: Endoscopy Unit  Anesthesia Type:MAC  Level of Consciousness: awake, alert , oriented and patient cooperative  Airway & Oxygen Therapy: Patient Spontanous Breathing and Patient connected to nasal cannula oxygen  Post-op Assessment: Report given to RN and Post -op Vital signs reviewed and stable  Post vital signs: Reviewed and stable  Last Vitals:  Vitals Value Taken Time  BP 109/83 10/14/2018  2:10 PM  Temp 37 C 10/14/2018  2:09 PM  Pulse 107 10/14/2018  2:12 PM  Resp 24 10/14/2018  2:12 PM  SpO2 91 % 10/14/2018  2:12 PM  Vitals shown include unvalidated device data.  Last Pain:  Vitals:   10/14/18 1409  TempSrc: Axillary  PainSc:          Complications: No apparent anesthesia complications

## 2018-10-14 NOTE — Progress Notes (Signed)
RN went to pts. room to check blood bank number on pts. arm band, pt did not have on a blood bank arm band. Rn notified blood bank who told RN that pt would need a new type and screen ordered., Dr Candiss Norse notified and new order placed for type and screen.

## 2018-10-14 NOTE — Op Note (Signed)
[facility] Patient Name: [name] Procedure Date : 10/14/2018 MRN: [id number] Attending MD: Wonda Horner , MD Date of Birth: May 20, 1941 CSN: [id number] Age: 77 Admit Type: Inpatient Procedure:                Upper GI endoscopy Indications:              Acute post hemorrhagic anemia, Melena Providers:                Wonda Horner, MD, Burtis Junes, RN, Alan Mulder,                            Technician Referring MD:              Medicines:                Propofol per Anesthesia Complications:            No immediate complications. Estimated Blood Loss:     Estimated blood loss was minimal. Procedure:                Pre-Anesthesia Assessment:                           - Prior to the procedure, a History and Physical                            was performed, and patient medications and                            allergies were reviewed. The patient's tolerance of                            previous anesthesia was also reviewed. The risks                            and benefits of the procedure and the sedation                            options and risks were discussed with the patient.                            All questions were answered, and informed consent                            was obtained. Prior Anticoagulants: The patient has                            taken Eliquis (apixaban), last dose was 2 days                            prior to procedure. ASA Grade Assessment: III - A                            patient with severe systemic disease. After  reviewing the risks and benefits, the patient was                            deemed in satisfactory condition to undergo the                            procedure.                           After obtaining informed consent, the endoscope was                            passed under direct vision. Throughout the                            procedure, the patient's blood pressure, pulse, and                             oxygen saturations were monitored continuously. The                            GIF-H190 ([id number]) Olympus adult EGD was introduced                            through the mouth, and advanced to the second part                            of duodenum. The upper GI endoscopy was                            accomplished without difficulty. The patient                            tolerated the procedure well. Scope In: Scope Out: Findings:      The examined esophagus was normal.      One cratered gastric ulcer with no stigmata of bleeding was found in the       cardia and in the gastric fundus. The lesion was 10 mm in largest       dimension. Biopsies were taken with a cold forceps for histology.      The examined duodenum was normal. Impression:               - Normal esophagus.                           - Gastric ulcer with no stigmata of bleeding.                            Biopsied.                           - Normal examined duodenum. Recommendation:           - Clear liquid diet.                           -  Continue present medications.                           - Await pathology results. Procedure Code(s):        --- Professional ---                           [phone], Esophagogastroduodenoscopy, flexible,                            transoral; with biopsy, single or multiple Diagnosis Code(s):        --- Professional ---                           K25.9, Gastric ulcer, unspecified as acute or                            chronic, without hemorrhage or perforation                           D62, Acute posthemorrhagic anemia                           K92.1, Melena (includes Hematochezia) CPT copyright 2018 American Medical Association. All rights reserved. The codes documented in this report are preliminary and upon coder review may  be revised to meet current compliance requirements. Wonda Horner, MD 10/14/2018 2:12:52 PM This report has been signed  electronically. Number of Addenda: 0

## 2018-10-14 NOTE — Progress Notes (Signed)
PROGRESS NOTE                                                                                                                                                                                                             Patient Demographics:    [name], is a 77 y.o. male, DOB - 10-Feb-1941, VOZ:[id number]  Admit date - 10/13/2018   Admitting Physician Karmen Bongo, MD  Outpatient Primary MD for the patient is Delorise Shiner, MD  LOS - 1  Chief Complaint  Patient presents with  . Altered Mental Status       Brief Narrative   [name] is a 77 y.o. male with medical history significant of glaucoma; HTN; syncope; CVA; severe AS s/p TAVR; PFO; afib on Eliquis; and chronic pain presenting with AMS.  He reports feeling confused and fuzzy headed for several days.  He is able to report that he went to his dentist today.  He denies pain, specifically chest and abdominal pain.  He acknowledges dark bloody stools for about 2 days.  He is unable to provide additional history due to ongoing AMS, in the ER he was found to have Hemoccult positive stool acute anemia.  GI was consulted and we were requested to admit.   Subjective:    [name] today has, No headache, No chest pain, No abdominal pain - No Nausea, No new weakness tingling or numbness, No Cough - SOB. Slightly confused.   Assessment  & Plan :     1.  Acute upper GI bleed related acute blood loss anemia.  History of melanotic stools, anemia, elevated BUN all pointing towards upper GI bleed, was on aspirin and Eliquis both held, continue IV PPI, type cross and transfuse 2 units of packed RBC on 10/14/2018 and continue to monitor H&H.  GI following may need EGD, is n.p.o.  2.  Encephalopathy.  This is acute on chronic.  He has underlying dementia, he saw neurology for cognitive deficits in 2016, he likely has mild metabolic encephalopathy due to worsening anemia.  Transfuse, supportive care, avoid narcotics and  benzodiazepines.  Use Haldol as needed if needed.  3.  ARF on CKD 3.  Baseline creatinine around 1.2.  Due to #1 above.  Transfuse and monitor avoid nephrotoxins.  4.  Dyslipidemia on Zocor will resume once taking oral diet.  5.  Hypertension.  Hold ACE inhibitor due to ARF and low blood pressures, low-dose beta-blocker to continue if tolerated by blood pressure.  6.  Chronic atrial fibrillation status post TAVR.  On Eliquis and aspirin, unfortunately both had to be held due to #1 above, continue beta-blocker, [place] vas 2 score of at least 3, will resume both medications once H&H has stabilized.  7. Depression and dementia.  Continue Seroquel, Topamax and Wellbutrin combination.  8.  Deconditioning.  Start PT once H&H has stabilized.    Family Communication  :  None  Code Status :  Full  Disposition Plan  :  Tele  Consults  :  GI  Procedures  :      DVT Prophylaxis  :   SCDs    Lab Results  Component Value Date   PLT 258 10/14/2018    Diet :  Diet Order            Diet NPO time specified  Diet effective midnight               Inpatient Medications Scheduled Meds: . sodium chloride   Intravenous Once  . sodium chloride   Intravenous Once  . aspirin EC  81 mg Oral Daily  . buPROPion  150 mg Oral Daily  . fluticasone  1 spray Each Nare Daily  . furosemide  20 mg Intravenous Once  . gabapentin  300 mg Oral TID  . methocarbamol  500 mg Oral BID  . metoprolol succinate  25 mg Oral BID  . naLOXone (NARCAN)  injection  0.1 mg Intravenous Once  . pantoprazole  40 mg Intravenous Once  . QUEtiapine  25 mg Oral QHS  . rOPINIRole  0.25 mg Oral BID  . simvastatin  40 mg Oral QHS  . sodium chloride flush  3 mL Intravenous Q12H   Continuous Infusions: . lactated ringers 125 mL/hr at 10/14/18 0602   PRN Meds:.acetaminophen **OR** acetaminophen, diphenhydrAMINE, ondansetron **OR** ondansetron (ZOFRAN) IV, oxyCODONE-acetaminophen **AND** oxyCODONE  Antibiotics  :     Anti-infectives (From admission, onward)   None          Objective:   Vitals:   10/13/18 1544 10/13/18 1930 10/13/18 2056 10/14/18 0551  BP: 93/80 117/62 112/62 98/76  Pulse: (!) 102 (!) 105 (!) 112 (!) 103  Resp: 20 18 18 18   Temp:   98.4 F (36.9 C) 98.9 F (37.2 C)  SpO2: 97% 96% 98% 98%    Wt Readings from Last 3 Encounters:  09/20/18 116.9 kg  11/20/17 113.4 kg  08/30/15 92.5 kg     Intake/Output Summary (Last 24 hours) at 10/14/2018 0945 Last data filed at 10/13/2018 2307 Gross per 24 hour  Intake 1003.01 ml  Output -  Net 1003.01 ml     Physical Exam  Awake but minimally confused, able to answer basic questions and follow basic commands, No new F.N deficits, Normal affect [name].AT,[name] Supple Neck,No JVD, No cervical lymphadenopathy appriciated.  Symmetrical Chest wall movement, Good air movement bilaterally, CTAB RRR,No Gallops,Rubs or new Murmurs, No Parasternal Heave +ve B.Sounds, Abd Soft, No tenderness, No organomegaly appriciated, No rebound - guarding or rigidity. No Cyanosis, Clubbing or edema, No new Rash or bruise      Data Review:    CBC Recent Labs  Lab 10/13/18 1256 10/13/18 1306 10/13/18 2149 10/14/18 0510  WBC 7.9  --  7.2 6.5  HGB 7.9* 8.8* 7.5* 6.9*  HCT 27.2* 26.0* 26.4* 23.6*  PLT 288  --  278 258  MCV 85.8  --  84.9 83.7  MCH 24.9*  --  24.1* 24.5*  MCHC 29.0*  --  28.4* 29.2*  RDW 23.0*  --  22.6* 22.4*  LYMPHSABS 0.8  --   --   --   MONOABS 0.8  --   --   --   EOSABS 0.3  --   --   --   BASOSABS 0.2*  --   --   --     Chemistries  Recent Labs  Lab 10/13/18 1256 10/13/18 1306 10/14/18 0510  NA 141 142 143  K 5.0 4.9 4.5  CL 110 108 112*  CO2 23  --  25  GLUCOSE 88 87 78  BUN 68* 64* 43*  CREATININE 2.26* 2.40* 1.49*  CALCIUM 9.1  --  8.8*  AST 23  --   --   ALT 17  --   --   ALKPHOS 56  --   --   BILITOT 0.5  --   --     ------------------------------------------------------------------------------------------------------------------ No results for input(s): CHOL, HDL, LDLCALC, TRIG, CHOLHDL, LDLDIRECT in the last 72 hours.  Lab Results  Component Value Date   HGBA1C 5.3 03/12/2013   ------------------------------------------------------------------------------------------------------------------ No results for input(s): TSH, T4TOTAL, T3FREE, THYROIDAB in the last 72 hours.  Invalid input(s): FREET3 ------------------------------------------------------------------------------------------------------------------ No results for input(s): VITAMINB12, FOLATE, FERRITIN, TIBC, IRON, RETICCTPCT in the last 72 hours.  Coagulation profile Recent Labs  Lab 10/13/18 1424  INR 1.45    No results for input(s): DDIMER in the last 72 hours.  Cardiac Enzymes No results for input(s): CKMB, TROPONINI, MYOGLOBIN in the last 168 hours.  Invalid input(s): CK ------------------------------------------------------------------------------------------------------------------    Component Value Date/Time   BNP 705.9 (H) 09/18/2018 1503    Micro Results No results found for this or any previous visit (from the past 240 hour(s)).  Radiology Reports Dg Chest 2 View  Result Date: 10/13/2018 CLINICAL DATA:  Altered mental status EXAM: CHEST - 2 VIEW COMPARISON:  Chest x-ray of September 18, 2018 FINDINGS: The lungs are mildly hyperinflated. There is no focal infiltrate. There is no pleural effusion. The heart and pulmonary vascularity are normal. There is an ascending aortic stent graft in place. There is calcification in the wall of the aortic arch. There is multilevel degenerative disc disease of the thoracic spine. IMPRESSION: Chronic bronchitic changes.  No pneumonia nor CHF. Thoracic aortic atherosclerosis. Electronically Signed   By: David  Martinique M.D.   On: 10/13/2018 13:24   Ct Head Wo Contrast  Result Date:  10/13/2018 CLINICAL DATA:  Confusion with unsteady gait and dizziness EXAM: CT HEAD WITHOUT CONTRAST TECHNIQUE: Contiguous axial images were obtained from the base of the skull through the vertex without intravenous contrast. COMPARISON:  March 20, 2013 FINDINGS: Brain: Moderate diffuse atrophy is stable. There is artifact in the posterior fossa due to postoperative metal fixation in the occipital region midline and toward the left. There is no demonstrable intracranial mass, hemorrhage, extra-axial fluid collection, or midline shift. There is mild small vessel disease in the centra semiovale bilaterally, stable. No acute infarct is demonstrable on this study. Vascular: There is no appreciable hyperdense vessel. There is calcification in each carotid siphon region and in the distal vertebral arteries bilaterally. Skull: Postoperative changes are noted involving the posterior occipital region, midline and toward the left. Bony calvarium otherwise appears intact. Sinuses/Orbits: There is mucosal thickening in several ethmoid air cells. Other visualized paranasal sinuses are clear. Frontal sinuses are hypoplastic. Orbits appear symmetric bilaterally. Other: Visualized mastoid air cells are clear. IMPRESSION: 1. There is artifact involving a portion of the posterior fossa due  to artifact from the nearby metal in the midline and left occipital bone regions. No mass or hemorrhage evident. There is mild periventricular small vessel disease. No evident acute infarct. 2.  There are foci of arterial vascular calcification. 3. Postoperative change involving the bone of the midline and left occipital regions. No acute fracture evident. 4.  Mucosal thickening in several ethmoid air cells. Electronically Signed   By: Lowella Grip III M.D.   On: 10/13/2018 13:52   Dg Chest Port 1 View  Result Date: 09/18/2018 CLINICAL DATA:  Shortness of breath. History of atrial fibrillation and valve replacement. EXAM: PORTABLE CHEST 1  VIEW COMPARISON:  Chest radiograph Apr 24, 2010 FINDINGS: Stable mild cardiomegaly. Mediastinal silhouette is not suspicious. Mildly calcified aortic arch. No pleural effusion or focal consolidation. Slight nodular scarring LEFT lung base. No pneumothorax. Cervical spine hardware laminectomies. Advanced degenerative changes shoulders, incompletely imaged. IMPRESSION: 1. Mild cardiomegaly.  No acute pulmonary process. 2.  Aortic Atherosclerosis (ICD10-I70.0). Electronically Signed   By: Elon Alas M.D.   On: 09/18/2018 15:40    Time Spent in minutes  30   Lala Lund M.D on 10/14/2018 at 9:45 AM  To page go to [email or web address] - password [facility]

## 2018-10-14 NOTE — H&P (Signed)
The patient presents to the endoscopy unit for EGD to evaluate melena and anemia  Physical: He is in no distress  Heart regular rhythm  Lungs clear  Abdomen soft and nontender  Impression: Melena and anemia  Plan: EGD

## 2018-10-14 NOTE — Anesthesia Preprocedure Evaluation (Addendum)
Anesthesia Evaluation  Patient identified by MRN, date of birth, ID band Patient awake    Reviewed: Allergy & Precautions, NPO status , Patient's Chart, lab work & pertinent test results  History of Anesthesia Complications (+) DIFFICULT AIRWAY and history of anesthetic complications  Airway Mallampati: II  TM Distance: >3 FB Neck ROM: Full    Dental no notable dental hx. (+) Teeth Intact, Dental Advisory Given   Pulmonary former smoker,    Pulmonary exam normal breath sounds clear to auscultation       Cardiovascular hypertension, Normal cardiovascular exam+ dysrhythmias Atrial Fibrillation + Valvular Problems/Murmurs (s/p TAVR 06/2018) AS  Rhythm:Irregular Rate:Normal  LHC 03/2018 LAD and LCX with mild luminal irregularities.  No significant disease.  TTE 06/2018: EF 55-60%, well-seated prosth AoV, peak velocity 156cm/s. AoV gradient 20mHg.   Neuro/Psych PSYCHIATRIC DISORDERS Anxiety Depression    GI/Hepatic negative GI ROS, Neg liver ROS,   Endo/Other  negative endocrine ROS  Renal/GU negative Renal ROS  negative genitourinary   Musculoskeletal  (+) Arthritis ,   Abdominal   Peds  Hematology  (+) Blood dyscrasia, anemia ,   Anesthesia Other Findings Hgb 6.9  Reproductive/Obstetrics                           Anesthesia Physical Anesthesia Plan  ASA: IV  Anesthesia Plan: MAC   Post-op Pain Management:    Induction: Intravenous  PONV Risk Score and Plan: 1 and Treatment may vary due to age or medical condition and Propofol infusion  Airway Management Planned: Natural Airway and Simple Face Mask  Additional Equipment:   Intra-op Plan:   Post-operative Plan:   Informed Consent: I have reviewed the patients History and Physical, chart, labs and discussed the procedure including the risks, benefits and alternatives for the proposed anesthesia with the patient or authorized  representative who has indicated his/her understanding and acceptance.   Dental advisory given  Plan Discussed with: CRNA  Anesthesia Plan Comments:         Anesthesia Quick Evaluation

## 2018-10-15 ENCOUNTER — Inpatient Hospital Stay (HOSPITAL_COMMUNITY): Payer: Medicare Other

## 2018-10-15 ENCOUNTER — Encounter (HOSPITAL_COMMUNITY): Payer: Self-pay | Admitting: Gastroenterology

## 2018-10-15 DIAGNOSIS — E7800 Pure hypercholesterolemia, unspecified: Secondary | ICD-10-CM

## 2018-10-15 DIAGNOSIS — G8929 Other chronic pain: Secondary | ICD-10-CM

## 2018-10-15 DIAGNOSIS — M25512 Pain in left shoulder: Secondary | ICD-10-CM

## 2018-10-15 LAB — BASIC METABOLIC PANEL
Anion gap: 6 (ref 5–15)
BUN: 23 mg/dL (ref 8–23)
CALCIUM: 9.3 mg/dL (ref 8.9–10.3)
CO2: 29 mmol/L (ref 22–32)
Chloride: 106 mmol/L (ref 98–111)
Creatinine, Ser: 1.28 mg/dL — ABNORMAL HIGH (ref 0.61–1.24)
GFR, EST NON AFRICAN AMERICAN: 53 mL/min — AB (ref >60)
Glucose, Bld: 82 mg/dL (ref 70–99)
Potassium: 4 mmol/L (ref 3.5–5.1)
Sodium: 141 mmol/L (ref 135–145)

## 2018-10-15 LAB — BPAM RBC
Blood Product Expiration Date: 201911272359
Blood Product Expiration Date: 201911272359
ISSUE DATE / TIME: 201910291221
ISSUE DATE / TIME: 201910291324
UNIT TYPE AND RH: 6200
Unit Type and Rh: 6200

## 2018-10-15 LAB — TYPE AND SCREEN
ABO/RH(D): A POS
Antibody Screen: NEGATIVE
Unit division: 0
Unit division: 0

## 2018-10-15 LAB — CBC
HCT: 32.6 % — ABNORMAL LOW (ref 39.0–52.0)
HEMOGLOBIN: 9.7 g/dL — AB (ref 13.0–17.0)
MCH: 24.9 pg — ABNORMAL LOW (ref 26.0–34.0)
MCHC: 29.8 g/dL — AB (ref 30.0–36.0)
MCV: 83.8 fL (ref 80.0–100.0)
Platelets: 266 10*3/uL (ref 150–400)
RBC: 3.89 MIL/uL — ABNORMAL LOW (ref 4.22–5.81)
RDW: 21.2 % — AB (ref 11.5–15.5)
WBC: 7.2 10*3/uL (ref 4.0–10.5)
nRBC: 0 % (ref 0.0–0.2)

## 2018-10-15 MED ORDER — FENTANYL CITRATE (PF) 100 MCG/2ML IJ SOLN
25.0000 ug | INTRAMUSCULAR | Status: DC | PRN
  Administered 2018-10-15 – 2018-10-16 (×4): 25 ug via INTRAVENOUS
  Filled 2018-10-15 (×5): qty 2

## 2018-10-15 MED ORDER — OXYCODONE-ACETAMINOPHEN 5-325 MG PO TABS
1.0000 | ORAL_TABLET | Freq: Four times a day (QID) | ORAL | Status: DC | PRN
  Administered 2018-10-15: 2 via ORAL
  Filled 2018-10-15: qty 2

## 2018-10-15 MED ORDER — ORAL CARE MOUTH RINSE
15.0000 mL | Freq: Two times a day (BID) | OROMUCOSAL | Status: DC
  Administered 2018-10-15: 15 mL via OROMUCOSAL

## 2018-10-15 NOTE — Anesthesia Postprocedure Evaluation (Signed)
Anesthesia Post Note  Patient: [name]  Procedure(s) Performed: ESOPHAGOGASTRODUODENOSCOPY (EGD) WITH PROPOFOL (N/A ) BIOPSY     Patient location during evaluation: Endoscopy Anesthesia Type: MAC Level of consciousness: awake and alert Pain management: pain level controlled Vital Signs Assessment: post-procedure vital signs reviewed and stable Respiratory status: spontaneous breathing, nonlabored ventilation, respiratory function stable and patient connected to nasal cannula oxygen Cardiovascular status: blood pressure returned to baseline and stable Postop Assessment: no apparent nausea or vomiting Anesthetic complications: no    Last Vitals:  Vitals:   10/15/18 1044 10/15/18 1603  BP: 110/64 118/83  Pulse: (!) 102 (!) 112  Resp:    Temp:  (!) 36.4 C  SpO2:  95%    Last Pain:  Vitals:   10/15/18 1510  TempSrc:   PainSc: 2                  Ethie Curless L Marleta Lapierre

## 2018-10-15 NOTE — Progress Notes (Signed)
PROGRESS NOTE                                                                                                                                                                                                             Patient Demographics:    [name], is a 77 y.o. male, DOB - February 09, 1941, [id number]  Admit date - 10/13/2018   Admitting Physician Karmen Bongo, MD  Outpatient Primary MD for the patient is Delorise Shiner, MD  LOS - 2  Chief Complaint  Patient presents with  . Altered Mental Status       Brief Narrative   [name] is a 77 y.o. male with medical history significant of glaucoma; HTN; syncope; CVA; severe AS s/p TAVR; PFO; afib on Eliquis; and chronic pain presenting with AMS.  He reports feeling confused and fuzzy headed for several days.  He is able to report that he went to his dentist today.  He denies pain, specifically chest and abdominal pain.  He acknowledges dark bloody stools for about 2 days.  He is unable to provide additional history due to ongoing AMS, in the ER he was found to have Hemoccult positive stool acute anemia.  GI was consulted and we were requested to admit.   Subjective:   Slightly confused. Patient in bed, appears comfortable, denies any headache, no fever, no chest pain or pressure, no shortness of breath , no abdominal pain. No focal weakness. Chronic L.shoulder pain.   Assessment  & Plan :     1.  Acute upper GI bleed related acute blood loss anemia.  History of melanotic stools, anemia, elevated BUN all pointing towards upper GI bleed, was on aspirin and Eliquis both held, transfused 2 units of packed RBC on 10/14/2018 with stable H&H, monitor. EGD shows no acute bleed but did have a Gastric Ulcer, on IV PPI.  2.  Encephalopathy.  This is acute on chronic.  He has underlying dementia, he saw neurology for cognitive deficits in 2016, he likely has mild metabolic encephalopathy due to worsening anemia.  Transfuse,  supportive care, avoid narcotics and benzodiazepines.  Use Haldol as needed if needed.  3.  ARF on CKD 3.  Baseline creatinine around 1.2.  Due to #1 above.  Transfuse and monitor avoid nephrotoxins.  4.  Dyslipidemia on Zocor will resume once taking oral diet.  5.  Hypertension.  Hold ACE inhibitor due to ARF and low blood pressures, low-dose beta-blocker to continue if tolerated by blood  pressure.  6.  Chronic atrial fibrillation status post TAVR.  On Eliquis and aspirin, unfortunately both had to be held due to #1 above, continue beta-blocker, [place] vas 2 score of at least 3, will resume both medications once H&H has stabilized.  7. Depression and dementia.  Continue Seroquel, Topamax and Wellbutrin combination.  8.  Deconditioning.  Start PT once H&H has stabilized.  9. Chr. L shoulder pain - in no distress, PRN pain Meds, check X Ray.  Family Communication  :  None  Code Status :  Full  Disposition Plan  :  Tele  Consults  :  GI  Procedures  :    EGD - Gastric Ulcer    DVT Prophylaxis  :   SCDs    Lab Results  Component Value Date   PLT 266 10/15/2018    Diet :  Diet Order            Diet regular Room service appropriate? Yes; Fluid consistency: Thin  Diet effective now               Inpatient Medications Scheduled Meds: . sodium chloride   Intravenous Once  . buPROPion  150 mg Oral Daily  . fluticasone  1 spray Each Nare Daily  . mouth rinse  15 mL Mouth Rinse BID  . metoprolol succinate  25 mg Oral BID  . naLOXone (NARCAN)  injection  0.1 mg Intravenous Once  . pantoprazole  40 mg Intravenous Q12H  . QUEtiapine  25 mg Oral QHS  . rOPINIRole  0.25 mg Oral BID  . simvastatin  40 mg Oral QHS  . sodium chloride flush  3 mL Intravenous Q12H   Continuous Infusions:  PRN Meds:.acetaminophen **OR** [DISCONTINUED] acetaminophen, diphenhydrAMINE, fentaNYL (SUBLIMAZE) injection, haloperidol lactate, metoprolol tartrate, [DISCONTINUED] ondansetron **OR**  ondansetron (ZOFRAN) IV, oxyCODONE-acetaminophen  Antibiotics  :   Anti-infectives (From admission, onward)   None          Objective:   Vitals:   10/15/18 0553 10/15/18 0712 10/15/18 0714 10/15/18 1044  BP: (!) 163/135 (!) 55/45 132/75 110/64  Pulse: (!) 104 97 99 (!) 102  Resp:      Temp:      TempSrc:      SpO2:      Height:        Wt Readings from Last 3 Encounters:  09/20/18 116.9 kg  11/20/17 113.4 kg  08/30/15 92.5 kg     Intake/Output Summary (Last 24 hours) at 10/15/2018 1351 Last data filed at 10/14/2018 2200 Gross per 24 hour  Intake 592.12 ml  Output 2000 ml  Net -1407.88 ml     Physical Exam  Awake mildly confused, oriented to place, No new F.N deficits, in no distress [place].AT,[name] Supple Neck,No JVD, No cervical lymphadenopathy appriciated.  Symmetrical Chest wall movement, Good air movement bilaterally, CTAB RRR,No Gallops, Rubs or new Murmurs, No Parasternal Heave +ve B.Sounds, Abd Soft, No tenderness, No organomegaly appriciated, No rebound - guarding or rigidity. No Cyanosis, Clubbing or edema, No new Rash or bruise      Data Review:    CBC Recent Labs  Lab 10/13/18 1256  10/13/18 2149 10/14/18 0510 10/14/18 1612 10/14/18 2113 10/15/18 0339  WBC 7.9  --  7.2 6.5  --   --  7.2  HGB 7.9*   < > 7.5* 6.9* 9.6* 9.9* 9.7*  HCT 27.2*   < > 26.4* 23.6* 32.9* 32.4* 32.6*  PLT 288  --  278 258  --   --  266  MCV 85.8  --  84.9 83.7  --   --  83.8  MCH 24.9*  --  24.1* 24.5*  --   --  24.9*  MCHC 29.0*  --  28.4* 29.2*  --   --  29.8*  RDW 23.0*  --  22.6* 22.4*  --   --  21.2*  LYMPHSABS 0.8  --   --   --   --   --   --   MONOABS 0.8  --   --   --   --   --   --   EOSABS 0.3  --   --   --   --   --   --   BASOSABS 0.2*  --   --   --   --   --   --    < > = values in this interval not displayed.    Chemistries  Recent Labs  Lab 10/13/18 1256 10/13/18 1306 10/14/18 0510 10/15/18 0339  NA 141 142 143 141  K 5.0 4.9 4.5 4.0    CL 110 108 112* 106  CO2 23  --  25 29  GLUCOSE 88 87 78 82  BUN 68* 64* 43* 23  CREATININE 2.26* 2.40* 1.49* 1.28*  CALCIUM 9.1  --  8.8* 9.3  AST 23  --   --   --   ALT 17  --   --   --   ALKPHOS 56  --   --   --   BILITOT 0.5  --   --   --    ------------------------------------------------------------------------------------------------------------------ No results for input(s): CHOL, HDL, LDLCALC, TRIG, CHOLHDL, LDLDIRECT in the last 72 hours.  Lab Results  Component Value Date   HGBA1C 5.3 03/12/2013   ------------------------------------------------------------------------------------------------------------------ No results for input(s): TSH, T4TOTAL, T3FREE, THYROIDAB in the last 72 hours.  Invalid input(s): FREET3 ------------------------------------------------------------------------------------------------------------------ No results for input(s): VITAMINB12, FOLATE, FERRITIN, TIBC, IRON, RETICCTPCT in the last 72 hours.  Coagulation profile Recent Labs  Lab 10/13/18 1424  INR 1.45    No results for input(s): DDIMER in the last 72 hours.  Cardiac Enzymes No results for input(s): CKMB, TROPONINI, MYOGLOBIN in the last 168 hours.  Invalid input(s): CK ------------------------------------------------------------------------------------------------------------------    Component Value Date/Time   BNP 705.9 (H) 09/18/2018 1503    Micro Results Recent Results (from the past 240 hour(s))  Urine culture     Status: None   Collection Time: 10/13/18  2:50 PM  Result Value Ref Range Status   Specimen Description URINE, RANDOM  Final   Special Requests NONE  Final   Culture   Final    NO GROWTH Performed at [facility], [address], [place], [place] [address]    Report Status 10/14/2018 FINAL  Final    Radiology Reports Dg Chest 2 View  Result Date: 10/13/2018 CLINICAL DATA:  Altered mental status EXAM: CHEST - 2 VIEW COMPARISON:  Chest  x-ray of September 18, 2018 FINDINGS: The lungs are mildly hyperinflated. There is no focal infiltrate. There is no pleural effusion. The heart and pulmonary vascularity are normal. There is an ascending aortic stent graft in place. There is calcification in the wall of the aortic arch. There is multilevel degenerative disc disease of the thoracic spine. IMPRESSION: Chronic bronchitic changes.  No pneumonia nor CHF. Thoracic aortic atherosclerosis. Electronically Signed   By: David  Martinique M.D.   On: 10/13/2018 13:24   Ct Head Wo Contrast  Result Date: 10/13/2018 CLINICAL DATA:  Confusion with unsteady gait and dizziness EXAM: CT HEAD WITHOUT CONTRAST TECHNIQUE: Contiguous axial images were obtained from the base of the skull through the vertex without intravenous contrast. COMPARISON:  March 20, 2013 FINDINGS: Brain: Moderate diffuse atrophy is stable. There is artifact in the posterior fossa due to postoperative metal fixation in the occipital region midline and toward the left. There is no demonstrable intracranial mass, hemorrhage, extra-axial fluid collection, or midline shift. There is mild small vessel disease in the centra semiovale bilaterally, stable. No acute infarct is demonstrable on this study. Vascular: There is no appreciable hyperdense vessel. There is calcification in each carotid siphon region and in the distal vertebral arteries bilaterally. Skull: Postoperative changes are noted involving the posterior occipital region, midline and toward the left. Bony calvarium otherwise appears intact. Sinuses/Orbits: There is mucosal thickening in several ethmoid air cells. Other visualized paranasal sinuses are clear. Frontal sinuses are hypoplastic. Orbits appear symmetric bilaterally. Other: Visualized mastoid air cells are clear. IMPRESSION: 1. There is artifact involving a portion of the posterior fossa due to artifact from the nearby metal in the midline and left occipital bone regions. No mass or  hemorrhage evident. There is mild periventricular small vessel disease. No evident acute infarct. 2.  There are foci of arterial vascular calcification. 3. Postoperative change involving the bone of the midline and left occipital regions. No acute fracture evident. 4.  Mucosal thickening in several ethmoid air cells. Electronically Signed   By: Lowella Grip III M.D.   On: 10/13/2018 13:52   Dg Chest Port 1 View  Result Date: 09/18/2018 CLINICAL DATA:  Shortness of breath. History of atrial fibrillation and valve replacement. EXAM: PORTABLE CHEST 1 VIEW COMPARISON:  Chest radiograph Apr 24, 2010 FINDINGS: Stable mild cardiomegaly. Mediastinal silhouette is not suspicious. Mildly calcified aortic arch. No pleural effusion or focal consolidation. Slight nodular scarring LEFT lung base. No pneumothorax. Cervical spine hardware laminectomies. Advanced degenerative changes shoulders, incompletely imaged. IMPRESSION: 1. Mild cardiomegaly.  No acute pulmonary process. 2.  Aortic Atherosclerosis (ICD10-I70.0). Electronically Signed   By: Elon Alas M.D.   On: 09/18/2018 15:40    Time Spent in minutes  30   Lala Lund M.D on 10/15/2018 at 1:51 PM  To page go to [email or web address] - password [facility]

## 2018-10-15 NOTE — Progress Notes (Signed)
[facility] Progress Note  Subjective: No abdominal pain. No sign of active GI bleeding. Tolerated clears.  Objective: Vital signs in last 24 hours: Temp:  [96.3 F (35.7 C)-98.7 F (37.1 C)] 98.7 F (37.1 C) (10/30 0552) Pulse Rate:  [92-114] 102 (10/30 1044) Resp:  [17-22] 18 (10/30 0552) BP: (55-163)/(45-141) 110/64 (10/30 1044) SpO2:  [92 %-100 %] 92 % (10/30 0552) Weight change:    PE: No distress Abdomen soft  Lab Results: Results for orders placed or performed during the hospital encounter of 10/13/18 (from the past 24 hour(s))  Hemoglobin and hematocrit, blood     Status: Abnormal   Collection Time: 10/14/18  4:12 PM  Result Value Ref Range   Hemoglobin 9.6 (L) 13.0 - 17.0 g/dL   HCT 32.9 (L) 39.0 - 52.0 %  Hemoglobin and hematocrit, blood     Status: Abnormal   Collection Time: 10/14/18  9:13 PM  Result Value Ref Range   Hemoglobin 9.9 (L) 13.0 - 17.0 g/dL   HCT 32.4 (L) 39.0 - 52.0 %  CBC     Status: Abnormal   Collection Time: 10/15/18  3:39 AM  Result Value Ref Range   WBC 7.2 4.0 - 10.5 K/uL   RBC 3.89 (L) 4.22 - 5.81 MIL/uL   Hemoglobin 9.7 (L) 13.0 - 17.0 g/dL   HCT 32.6 (L) 39.0 - 52.0 %   MCV 83.8 80.0 - 100.0 fL   MCH 24.9 (L) 26.0 - 34.0 pg   MCHC 29.8 (L) 30.0 - 36.0 g/dL   RDW 21.2 (H) 11.5 - 15.5 %   Platelets 266 150 - 400 K/uL   nRBC 0.0 0.0 - 0.2 %  Basic metabolic panel     Status: Abnormal   Collection Time: 10/15/18  3:39 AM  Result Value Ref Range   Sodium 141 135 - 145 mmol/L   Potassium 4.0 3.5 - 5.1 mmol/L   Chloride 106 98 - 111 mmol/L   CO2 29 22 - 32 mmol/L   Glucose, Bld 82 70 - 99 mg/dL   BUN 23 8 - 23 mg/dL   Creatinine, Ser 1.28 (H) 0.61 - 1.24 mg/dL   Calcium 9.3 8.9 - 10.3 mg/dL   GFR calc non Af Amer 53 (L) >60 mL/min   GFR calc Af Amer >60 >60 mL/min   Anion gap 6 5 - 15    Studies/Results: No results found.    Assessment: Gastric ulcer. Path pending.  Plan:   Continue current treatment.  Advance diet.    SAM F Edda Orea 10/15/2018, 11:40 AM  Pager: [phone] If no answer or after 5 PM call [phone] Lab Results  Component Value Date   HGB 9.7 (L) 10/15/2018   HGB 9.9 (L) 10/14/2018   HGB 9.6 (L) 10/14/2018   HCT 32.6 (L) 10/15/2018   HCT 32.4 (L) 10/14/2018   HCT 32.9 (L) 10/14/2018   ALKPHOS 56 10/13/2018   ALKPHOS 100 03/12/2013   ALKPHOS 70 04/14/2008   AST 23 10/13/2018   AST 23 03/12/2013   AST 21 04/14/2008   ALT 17 10/13/2018   ALT 17 03/12/2013   ALT 38 04/14/2008

## 2018-10-15 NOTE — Evaluation (Signed)
Physical Therapy Evaluation Patient Details Name: [name] MRN: [id number] DOB: December 25, 1940 Today's Date: 10/15/2018   History of Present Illness  [name] is a 77 y.o. male with medical history significant of glaucoma; HTN; syncope; CVA; severe AS s/p TAVR; PFO; afib on Eliquis; and chronic pain presenting with AMS.  Pt found to have and upper GI bleed with ABLA.  Clinical Impression  Pt admitted with/for AMS with ABLA for upper GIB.  Pt currently limited functionally due to the problems listed below.  (see problems list.)  Pt will benefit from PT to maximize function and safety to be able to get home safely with available assist from his sister.     Follow Up Recommendations Home health PT;Supervision - Intermittent    Equipment Recommendations  None recommended by PT    Recommendations for Other Services       Precautions / Restrictions Precautions Precautions: Fall      Mobility  Bed Mobility Overal bed mobility: Needs Assistance Bed Mobility: Supine to Sit     Supine to sit: Min guard     General bed mobility comments: head mildly raised,  no assist needed  Transfers Overall transfer level: Needs assistance Equipment used: Rolling walker (2 wheeled) Transfers: Sit to/from Stand Sit to Stand: Min assist            Ambulation/Gait Ambulation/Gait assistance: Min guard Gait Distance (Feet): 300 Feet Assistive device: Rolling walker (2 wheeled) Gait Pattern/deviations: Step-through pattern Gait velocity: slower Gait velocity interpretation: <1.8 ft/sec, indicate of risk for recurrent falls General Gait Details: generally steady with RW sats at 93% with HR 105 bpm  Stairs            Wheelchair Mobility    Modified Rankin (Stroke Patients Only)       Balance Overall balance assessment: Needs assistance Sitting-balance support: No upper extremity supported Sitting balance-Leahy Scale: Fair     Standing balance support: Single  extremity supported;Bilateral upper extremity supported Standing balance-Leahy Scale: Poor Standing balance comment: still reliant on AD                             Pertinent Vitals/Pain Pain Assessment: Faces Faces Pain Scale: Hurts a little bit Pain Location: left shoulder Pain Descriptors / Indicators: Sore Pain Intervention(s): Monitored during session    Home Living Family/patient expects to be discharged to:: Private residence Living Arrangements: Other relatives;Other (Comment)(is staying with his sister who lives a few houses down.) Available Help at Discharge: Family;Available PRN/intermittently;Other (Comment)(sister will supervise more than usual initially) Type of Home: House Home Access: Level entry     Home Layout: One level Home Equipment: Walker - 2 wheels;Cane - single point;Shower seat      Prior Function Level of Independence: Independent with assistive device(s);Independent         Comments: per pt, no AD in the house and cane outside.     Hand Dominance        Extremity/Trunk Assessment   Upper Extremity Assessment Upper Extremity Assessment: Defer to OT evaluation    Lower Extremity Assessment Lower Extremity Assessment: Overall WFL for tasks assessed(mild weakness proximally bil)       Communication   Communication: No difficulties  Cognition Arousal/Alertness: Awake/alert Behavior During Therapy: WFL for tasks assessed/performed Overall Cognitive Status: (not tested formally)  General Comments      Exercises     Assessment/Plan    PT Assessment Patient needs continued PT services  PT Problem List Decreased strength;Decreased activity tolerance;Decreased balance;Decreased mobility       PT Treatment Interventions Gait training;Functional mobility training;Therapeutic activities;Balance training;Patient/family education;DME instruction;Stair training    PT  Goals (Current goals can be found in the Care Plan section)  Acute Rehab PT Goals Patient Stated Goal: able to be independent at home PT Goal Formulation: With patient Time For Goal Achievement: 10/22/18 Potential to Achieve Goals: Good    Frequency Min 3X/week   Barriers to discharge        Co-evaluation               AM-PAC PT "6 Clicks" Daily Activity  Outcome Measure Difficulty turning over in bed (including adjusting bedclothes, sheets and blankets)?: A Little Difficulty moving from lying on back to sitting on the side of the bed? : A Little Difficulty sitting down on and standing up from a chair with arms (e.g., wheelchair, bedside commode, etc,.)?: Unable Help needed moving to and from a bed to chair (including a wheelchair)?: A Little Help needed walking in hospital room?: A Little Help needed climbing 3-5 steps with a railing? : A Little 6 Click Score: 16    End of Session   Activity Tolerance: Patient tolerated treatment well Patient left: in chair;with call bell/phone within reach;with family/visitor present Nurse Communication: Mobility status PT Visit Diagnosis: Unsteadiness on feet (R26.81);Other abnormalities of gait and mobility (R26.89)    Time: 1528-1600 PT Time Calculation (min) (ACUTE ONLY): 32 min   Charges:   PT Evaluation $PT Eval Moderate Complexity: 1 Mod PT Treatments $Gait Training: 8-22 mins        10/15/2018  Aptos Bing, PT Acute Rehabilitation Services [phone]  (pager) [phone]  (office)  Eliseo Gum Brently Voorhis 10/15/2018, 4:34 PM

## 2018-10-16 DIAGNOSIS — G9340 Encephalopathy, unspecified: Secondary | ICD-10-CM

## 2018-10-16 LAB — CBC
HCT: 32.6 % — ABNORMAL LOW (ref 39.0–52.0)
Hemoglobin: 9.7 g/dL — ABNORMAL LOW (ref 13.0–17.0)
MCH: 25 pg — AB (ref 26.0–34.0)
MCHC: 29.8 g/dL — ABNORMAL LOW (ref 30.0–36.0)
MCV: 84 fL (ref 80.0–100.0)
PLATELETS: 293 10*3/uL (ref 150–400)
RBC: 3.88 MIL/uL — ABNORMAL LOW (ref 4.22–5.81)
RDW: 20.8 % — AB (ref 11.5–15.5)
WBC: 7.9 10*3/uL (ref 4.0–10.5)
nRBC: 0 % (ref 0.0–0.2)

## 2018-10-16 LAB — BASIC METABOLIC PANEL
Anion gap: 6 (ref 5–15)
BUN: 25 mg/dL — AB (ref 8–23)
CALCIUM: 8.9 mg/dL (ref 8.9–10.3)
CO2: 27 mmol/L (ref 22–32)
Chloride: 106 mmol/L (ref 98–111)
Creatinine, Ser: 1.4 mg/dL — ABNORMAL HIGH (ref 0.61–1.24)
GFR calc Af Amer: 55 mL/min — ABNORMAL LOW (ref >60)
GFR calc non Af Amer: 47 mL/min — ABNORMAL LOW (ref >60)
GLUCOSE: 98 mg/dL (ref 70–99)
POTASSIUM: 3.9 mmol/L (ref 3.5–5.1)
Sodium: 139 mmol/L (ref 135–145)

## 2018-10-16 MED ORDER — PANTOPRAZOLE SODIUM 40 MG PO TBEC: 40.0000 mg | DELAYED_RELEASE_TABLET | Freq: Two times a day (BID) | ORAL | Status: DC

## 2018-10-16 MED ORDER — OXYCODONE-ACETAMINOPHEN 5-325 MG PO TABS
1.0000 | ORAL_TABLET | ORAL | Status: DC | PRN
  Administered 2018-10-16: 2 via ORAL
  Filled 2018-10-16: qty 2

## 2018-10-16 MED ORDER — SODIUM CHLORIDE 0.9 % IV BOLUS: 500.0000 mL | Freq: Once | INTRAVENOUS | Status: DC

## 2018-10-16 MED ORDER — OXYCODONE-ACETAMINOPHEN 5-325 MG PO TABS: 1.0000 | ORAL_TABLET | ORAL | Status: DC | PRN

## 2018-10-16 MED ORDER — PANTOPRAZOLE SODIUM 40 MG PO TBEC: 40.0000 mg | DELAYED_RELEASE_TABLET | Freq: Two times a day (BID) | ORAL | 0 refills | Status: DC

## 2018-10-16 MED ORDER — FENTANYL CITRATE (PF) 100 MCG/2ML IJ SOLN
50.0000 ug | Freq: Once | INTRAMUSCULAR | Status: DC
  Filled 2018-10-16: qty 2

## 2018-10-16 MED ORDER — OXYCODONE-ACETAMINOPHEN 10-325 MG PO TABS: 1.0000 | ORAL_TABLET | Freq: Four times a day (QID) | ORAL | 0 refills | Status: DC | PRN

## 2018-10-16 MED ORDER — SODIUM CHLORIDE 0.9 % IV BOLUS
1000.0000 mL | Freq: Once | INTRAVENOUS | Status: AC
  Administered 2018-10-16: 1000 mL via INTRAVENOUS

## 2018-10-16 MED ORDER — APIXABAN 5 MG PO TABS: 5.0000 mg | ORAL_TABLET | Freq: Two times a day (BID) | ORAL | Status: DC

## 2018-10-16 MED ORDER — LACTATED RINGERS IV SOLN
INTRAVENOUS | Status: DC
  Administered 2018-10-16: 06:00:00 via INTRAVENOUS

## 2018-10-16 NOTE — Progress Notes (Signed)
NCM has made multiple calls to pt's sister  regarding pt's d/c,voice messages were left....awaiting call back.  Pt states his plan is to d/c back to sister's home. Whitman Hero RN,BSN,CM

## 2018-10-16 NOTE — Consult Note (Signed)
   [facility] CM Inpatient Consult   10/16/2018  [name] 1941-01-08 [id number]  Patient screened for readmission less than 30 days in [facility] and assessed for [facility] services. Patient is in the [place] of the [place] [facility] services under patient's Medicare plan.   Met with the patient sitting up in chair regarding services.  Patient states, "I am in pain right now and it is taking so long to get my pain medicine [grimacing]."  Inquiry regarding pain and then he states, "well they can't give it to me because my blood pressure was too low."  Explained services and left a brochure at the bedside. Will follow up as time permits tomorrow and if patient is still hospitalized.  He states he will look over it when he gets back to bed.  Please place a [facility] Care Management consult or for questions contact:   Natividad Brood, RN BSN [facility] Liaison  [phone] business mobile phone Toll free office [phone]

## 2018-10-16 NOTE — Progress Notes (Signed)
Nsg Discharge Note  Admit Date:  10/13/2018 Discharge date: 10/16/2018   [name] to be D/C'd Home with sister per MD order.  AVS completed.  Copy for chart, and copy for patient. Patient Handgun returned to patient with the help of security as patient was getting in to personal vehicle to leave. Patient/caregiver able to verbalize understanding.  Discharge Medication: Allergies as of 10/16/2018      Reactions   Ativan [lorazepam]    delirium   Duloxetine Other (See Comments)   Increased depression, irritability   Pregabalin Other (See Comments)   Flashbacks, Bad dreams, Aggressive behavior, swelling   Zanaflex [tizanidine Hcl] Anxiety      Medication List    STOP taking these medications   aspirin EC 81 MG tablet   lisinopril 30 MG tablet Commonly known as:  PRINIVIL,ZESTRIL   sildenafil 50 MG tablet Commonly known as:  VIAGRA     TAKE these medications   apixaban 5 MG Tabs tablet Commonly known as:  ELIQUIS Take 1 tablet (5 mg total) by mouth 2 (two) times daily. Start taking on:  10/20/2018 What changed:  These instructions start on 10/20/2018. If you are unsure what to do until then, ask your doctor or other care provider.   BETA-SITOSTEROL PLANT STEROLS Caps Take 1 capsule by mouth daily.   buPROPion 150 MG 24 hr tablet Commonly known as:  WELLBUTRIN XL Take 150 mg by mouth daily.   COLLAGEN PO Take 6 capsules by mouth daily.   cyanocobalamin 1000 MCG/ML injection Commonly known as:  (VITAMIN B-12) Inject 1,000 mcg into the muscle every 30 (thirty) days.   fluticasone 50 MCG/ACT nasal spray Commonly known as:  FLONASE Place 1 spray into both nostrils daily.   furosemide 20 MG tablet Commonly known as:  LASIX Take 2 tablets (40mg ) in AM and 1 tablet (20mg  in PM) What changed:    how much to take  how to take this  when to take this  additional instructions   gabapentin 300 MG capsule Commonly known as:  NEURONTIN Take 300 mg by mouth 3  (three) times daily.   methocarbamol 500 MG tablet Commonly known as:  ROBAXIN Take 500 mg by mouth 2 (two) times daily.   metoprolol succinate 25 MG 24 hr tablet Commonly known as:  TOPROL-XL Take 1 tablet (25 mg total) by mouth 2 (two) times daily.   OMEGA 3 PO Take 1 capsule by mouth 2 (two) times daily.   ondansetron 8 MG tablet Commonly known as:  ZOFRAN Take 8 mg by mouth every 8 (eight) hours as needed for nausea or vomiting.   OVER THE COUNTER MEDICATION Nitro beets otc supplement - 1 rounded teaspoon once daily   oxyCODONE-acetaminophen 10-325 MG tablet Commonly known as:  PERCOCET Take 1 tablet by mouth 4 (four) times daily as needed for pain.   pantoprazole 40 MG tablet Commonly known as:  PROTONIX Take 1 tablet (40 mg total) by mouth 2 (two) times daily.   polyethylene glycol packet Commonly known as:  MIRALAX / GLYCOLAX Take 17 g by mouth daily.   potassium chloride SA 20 MEQ tablet Commonly known as:  K-DUR,KLOR-CON Take 1 tablet (20 mEq total) by mouth daily.   QUEtiapine 25 MG tablet Commonly known as:  SEROQUEL Take 25 mg by mouth at bedtime.   rOPINIRole 0.25 MG tablet Commonly known as:  REQUIP Take 0.25 mg by mouth 2 (two) times daily. For restless legs   silver sulfADIAZINE 1 %  cream Commonly known as:  SILVADENE Apply 1 application topically 2 (two) times daily.   simvastatin 40 MG tablet Commonly known as:  ZOCOR Take 40 mg by mouth at bedtime.   topiramate 25 MG tablet Commonly known as:  TOPAMAX Take 25 mg by mouth 2 (two) times daily.   TURMERIC PO Take 1-2 capsules by mouth daily.            Durable Medical Equipment  (From admission, onward)         Start     Ordered   10/16/18 1144  For home use only DME Walker rolling  Once    Comments:  5 wheel  Question:  Patient needs a walker to treat with the following condition  Answer:  Weakness   10/16/18 1143          Discharge Assessment:  Skin clean, dry and  intact without evidence of skin break down, no evidence of skin tears noted. IV catheter discontinued intact. Site without signs and symptoms of complications - no redness or edema noted at insertion site, patient denies c/o pain - only slight tenderness at site.  Dressing with slight pressure applied.  D/c Instructions-Education: Discharge instructions given to patient/family with verbalized understanding. D/c education completed with patient/family including follow up instructions, medication list, d/c activities limitations if indicated, with other d/c instructions as indicated by MD - patient able to verbalize understanding, all questions fully answered. Patient instructed to return to ED, call 911, or call MD for any changes in condition.  Patient escorted via [facility], and D/C home via private auto  N[place] N Emer Onnen, RN 10/16/2018 3:39 PM

## 2018-10-16 NOTE — Discharge Instructions (Signed)
Follow with Primary MD Delorise Shiner, MD in 7 days   Get CBC, CMP, 2 view Chest X ray -  checked  by Primary MD or SNF MD in 5-7 days   Activity: As tolerated with Full fall precautions use walker/cane & assistance as needed  Disposition SNF  Diet: Heart Healthy with feeding assistance and aspiration precautions as needed.  For Heart failure patients - Check your Weight same time everyday, if you gain over 2 pounds, or you develop in leg swelling, experience more shortness of breath or chest pain, call your Primary MD immediately. Follow Cardiac Low Salt Diet and 1.5 lit/day fluid restriction.  Special Instructions: If you have smoked or chewed Tobacco  in the last 2 yrs please stop smoking, stop any regular Alcohol  and or any Recreational drug use.  On your next visit with your primary care physician please Get Medicines reviewed and adjusted.  Please request your Prim.MD to go over all Hospital Tests and Procedure/Radiological results at the follow up, please get all Hospital records sent to your Prim MD by signing hospital release before you go home.  If you experience worsening of your admission symptoms, develop shortness of breath, life threatening emergency, suicidal or homicidal thoughts you must seek medical attention immediately by calling 911 or calling your MD immediately  if symptoms less severe.  You Must read complete instructions/literature along with all the possible adverse reactions/side effects for all the Medicines you take and that have been prescribed to you. Take any new Medicines after you have completely understood and accpet all the possible adverse reactions/side effects.

## 2018-10-16 NOTE — Care Management Note (Signed)
Case Management Note  Patient Details  Name: [name] MRN: [id number] Date of Birth: 23-Dec-1940  Subjective/Objective:  GI bleed.   Resides with sister, [name].             Algernon Huxley (Sister)       [phone]          PCP: Delorise Shiner  Action/Plan: NCM finally spoke with pt's sister @ bedside via phone and made her aware of pt's d/c plan, transition to home with home health services.  [name] stated she will provide pt's  transportation to home. Expected Discharge Date:  10/16/18               Expected Discharge Plan:  [place]  In-House Referral:     Discharge planning Services  CM Consult  Post Acute Care Choice:    Choice offered to:  Patient  DME Arranged:  N/A(Ptstates has rolling walker) DME Agency:  NA  HH Arranged:  RN, PT, Nurse's Aide HH Agency:  [facility]  Status of Service:  Completed, signed off  If discussed at [place] of Stay Meetings, dates discussed:    Additional Comments:  Sharin Mons, RN 10/16/2018, 2:33 PM

## 2018-10-16 NOTE — Discharge Summary (Addendum)
[name] XFG:[id number] DOB: February 09, 1941 DOA: 10/13/2018  PCP: Delorise Shiner, MD  Admit date: 10/13/2018  Discharge date: 10/16/2018  Admitted From: Home  Disposition: Home   Recommendations for Outpatient Follow-up:   Follow up with PCP in 1-2 weeks  PCP Please obtain BMP/CBC, 2 view CXR in 1week,  (see Discharge instructions)   PCP Please follow up on the following pending results:    Home Health: PT, RN Equipment/Devices: Walker Consultations: None Discharge Condition: Fair   CODE STATUS: Full   Diet Recommendation: Heart Healthy    Chief Complaint  Patient presents with  . Altered Mental Status     Brief history of present illness from the day of admission and additional interim summary    Brett Philbrook Manessis a 77 y.o.malewith medical history significant ofglaucoma; HTN; syncope; CVA; severe AS s/p TAVR; PFO; afib on Eliquis; and chronic pain presenting with AMS.He reports feeling confused and fuzzy headed for several days. He is able to report that he went to his dentist today. He denies pain, specifically chest and abdominal pain. He acknowledges dark bloody stools for about 2 days. He is unable to provide additional history due to ongoing AMS, in the ER he was found to have Hemoccult positive stool acute anemia.  GI was consulted and we were requested to admit.                                                                  Hospital Course    1.  Acute upper GI bleed related acute blood loss anemia.  History of melanotic stools, anemia, elevated BUN all pointing towards upper GI bleed, was on aspirin and Eliquis both held, transfused 2 units of packed RBC on 10/14/2018 with stable H&H, monitor. EGD showed no acute bleeding but did have evidence of gastric ulcer, his aspirin and Eliquis  were held, he was placed on IV PPI, no further signs of ongoing bleeding H&H stable, at this time he will be transitioned to oral PPI and discharged to SNF.  Discontinue aspirin indefinitely resume Eliquis from 10/20/2018, discussed with Dr. Narda Amber.  Should follow with Dr. Winnifred Friar for biopsy results from his gastric ulcer site in 1 to 2 weeks.  2.  Encephalopathy.  This is acute on chronic.  He has underlying dementia, he saw neurology for cognitive deficits in 2016 refused various testing at that time, he likely has mild metabolic encephalopathy due to worsening anemia.  Transfused, narcotics and benzodiazepines were minimized, he is likely close to his baseline now with minimal delirium off and on, will be discharged to SNF, request SNF to continue minimizing benzodiazepine use and narcotic use as much as possible.  3.  ARF on CKD 3.  Baseline creatinine around 1.2.  Due to #1 above.  Improved after hydration  and transfusion, continue to hold ACE inhibitor, repeat BMP in a week at SNF.  4.  Dyslipidemia on Zocor will resume once taking oral diet.  5.  Hypertension.  Hold ACE inhibitor due to ARF and low blood pressures, low-dose beta-blocker to continue monitor blood pressure and renal function at SNF and adjust medications as needed.  6.  Chronic atrial fibrillation status post TAVR.  On Eliquis and aspirin, unfortunately both had to be held due to #1 above, continue beta-blocker, [place] vas 2 score of at least 3, since he had gastric ulcer on EGD will discontinue aspirin indefinitely and less his cardiologist thinks otherwise, resume Eliquis on 10/20/2018, discussed with GI physician Dr. Penelope Coop.  7. Depression and dementia.  Continue Seroquel, Topamax and Wellbutrin combination.  8.  Deconditioning.    Received PT, family unable to take good care at home, he and his sister are adamant that he should go home with home PT which will be arranged.  9. Chr. L shoulder pain - in no distress, PRN pain  medications continued, x-ray stable with changes suggestive of chronic rotator cuff injury.   Discharge diagnosis     Principal Problem:   GI bleeding Active Problems:   HYPERCHOLESTEROLEMIA   HYPERTENSION, BENIGN   Depression   S/P TAVR (transcatheter aortic valve replacement)   Permanent atrial fibrillation   Acute metabolic encephalopathy   Acute renal failure (ARF) (HCC)   Acute encephalopathy    Discharge instructions    Discharge Instructions    Diet - low sodium heart healthy   Complete by:  As directed    Discharge instructions   Complete by:  As directed    Follow with Primary MD Delorise Shiner, MD in 7 days   Get CBC, CMP, 2 view Chest X ray -  checked  by Primary MD or SNF MD in 5-7 days   Activity: As tolerated with Full fall precautions use walker/cane & assistance as needed  Disposition SNF  Diet: Heart Healthy with feeding assistance and aspiration precautions as needed.  For Heart failure patients - Check your Weight same time everyday, if you gain over 2 pounds, or you develop in leg swelling, experience more shortness of breath or chest pain, call your Primary MD immediately. Follow Cardiac Low Salt Diet and 1.5 lit/day fluid restriction.  Special Instructions: If you have smoked or chewed Tobacco  in the last 2 yrs please stop smoking, stop any regular Alcohol  and or any Recreational drug use.  On your next visit with your primary care physician please Get Medicines reviewed and adjusted.  Please request your Prim.MD to go over all Hospital Tests and Procedure/Radiological results at the follow up, please get all Hospital records sent to your Prim MD by signing hospital release before you go home.  If you experience worsening of your admission symptoms, develop shortness of breath, life threatening emergency, suicidal or homicidal thoughts you must seek medical attention immediately by calling 911 or calling your MD immediately  if symptoms less  severe.  You Must read complete instructions/literature along with all the possible adverse reactions/side effects for all the Medicines you take and that have been prescribed to you. Take any new Medicines after you have completely understood and accpet all the possible adverse reactions/side effects.   Increase activity slowly   Complete by:  As directed       Discharge Medications   Allergies as of 10/16/2018      Reactions   Ativan [lorazepam]  delirium   Duloxetine Other (See Comments)   Increased depression, irritability   Pregabalin Other (See Comments)   Flashbacks, Bad dreams, Aggressive behavior, swelling   Zanaflex [tizanidine Hcl] Anxiety      Medication List    STOP taking these medications   aspirin EC 81 MG tablet   lisinopril 30 MG tablet Commonly known as:  PRINIVIL,ZESTRIL   sildenafil 50 MG tablet Commonly known as:  VIAGRA     TAKE these medications   apixaban 5 MG Tabs tablet Commonly known as:  ELIQUIS Take 1 tablet (5 mg total) by mouth 2 (two) times daily. Start taking on:  10/20/2018 What changed:  These instructions start on 10/20/2018. If you are unsure what to do until then, ask your doctor or other care provider.   BETA-SITOSTEROL PLANT STEROLS Caps Take 1 capsule by mouth daily.   buPROPion 150 MG 24 hr tablet Commonly known as:  WELLBUTRIN XL Take 150 mg by mouth daily.   COLLAGEN PO Take 6 capsules by mouth daily.   cyanocobalamin 1000 MCG/ML injection Commonly known as:  (VITAMIN B-12) Inject 1,000 mcg into the muscle every 30 (thirty) days.   fluticasone 50 MCG/ACT nasal spray Commonly known as:  FLONASE Place 1 spray into both nostrils daily.   furosemide 20 MG tablet Commonly known as:  LASIX Take 2 tablets (40mg ) in AM and 1 tablet (20mg  in PM) What changed:    how much to take  how to take this  when to take this  additional instructions   gabapentin 300 MG capsule Commonly known as:  NEURONTIN Take 300 mg  by mouth 3 (three) times daily.   methocarbamol 500 MG tablet Commonly known as:  ROBAXIN Take 500 mg by mouth 2 (two) times daily.   metoprolol succinate 25 MG 24 hr tablet Commonly known as:  TOPROL-XL Take 1 tablet (25 mg total) by mouth 2 (two) times daily.   OMEGA 3 PO Take 1 capsule by mouth 2 (two) times daily.   ondansetron 8 MG tablet Commonly known as:  ZOFRAN Take 8 mg by mouth every 8 (eight) hours as needed for nausea or vomiting.   OVER THE COUNTER MEDICATION Nitro beets otc supplement - 1 rounded teaspoon once daily   oxyCODONE-acetaminophen 10-325 MG tablet Commonly known as:  PERCOCET Take 1 tablet by mouth 4 (four) times daily as needed for pain.   pantoprazole 40 MG tablet Commonly known as:  PROTONIX Take 1 tablet (40 mg total) by mouth 2 (two) times daily.   polyethylene glycol packet Commonly known as:  MIRALAX / GLYCOLAX Take 17 g by mouth daily.   potassium chloride SA 20 MEQ tablet Commonly known as:  K-DUR,KLOR-CON Take 1 tablet (20 mEq total) by mouth daily.   QUEtiapine 25 MG tablet Commonly known as:  SEROQUEL Take 25 mg by mouth at bedtime.   rOPINIRole 0.25 MG tablet Commonly known as:  REQUIP Take 0.25 mg by mouth 2 (two) times daily. For restless legs   silver sulfADIAZINE 1 % cream Commonly known as:  SILVADENE Apply 1 application topically 2 (two) times daily.   simvastatin 40 MG tablet Commonly known as:  ZOCOR Take 40 mg by mouth at bedtime.   topiramate 25 MG tablet Commonly known as:  TOPAMAX Take 25 mg by mouth 2 (two) times daily.   TURMERIC PO Take 1-2 capsules by mouth daily.       Follow-up Information    Delorise Shiner, MD. Schedule an appointment as  soon as possible for a visit in 1 week(s).   Specialty:  Family Medicine Contact information: [place] [address] [phone]        Wonda Horner, MD. Schedule an appointment as soon as possible for a visit in 1 week(s).   Specialty:   Gastroenterology Contact information: [address] [place] [address] [phone]           Major procedures and Radiology Reports - PLEASE review detailed and final reports thoroughly  -     EGD - Gastric Ulcer   Dg Chest 2 View  Result Date: 10/13/2018 CLINICAL DATA:  Altered mental status EXAM: CHEST - 2 VIEW COMPARISON:  Chest x-ray of September 18, 2018 FINDINGS: The lungs are mildly hyperinflated. There is no focal infiltrate. There is no pleural effusion. The heart and pulmonary vascularity are normal. There is an ascending aortic stent graft in place. There is calcification in the wall of the aortic arch. There is multilevel degenerative disc disease of the thoracic spine. IMPRESSION: Chronic bronchitic changes.  No pneumonia nor CHF. Thoracic aortic atherosclerosis. Electronically Signed   By: David  Martinique M.D.   On: 10/13/2018 13:24   Ct Head Wo Contrast  Result Date: 10/13/2018 CLINICAL DATA:  Confusion with unsteady gait and dizziness EXAM: CT HEAD WITHOUT CONTRAST TECHNIQUE: Contiguous axial images were obtained from the base of the skull through the vertex without intravenous contrast. COMPARISON:  March 20, 2013 FINDINGS: Brain: Moderate diffuse atrophy is stable. There is artifact in the posterior fossa due to postoperative metal fixation in the occipital region midline and toward the left. There is no demonstrable intracranial mass, hemorrhage, extra-axial fluid collection, or midline shift. There is mild small vessel disease in the centra semiovale bilaterally, stable. No acute infarct is demonstrable on this study. Vascular: There is no appreciable hyperdense vessel. There is calcification in each carotid siphon region and in the distal vertebral arteries bilaterally. Skull: Postoperative changes are noted involving the posterior occipital region, midline and toward the left. Bony calvarium otherwise appears intact. Sinuses/Orbits: There is mucosal  thickening in several ethmoid air cells. Other visualized paranasal sinuses are clear. Frontal sinuses are hypoplastic. Orbits appear symmetric bilaterally. Other: Visualized mastoid air cells are clear. IMPRESSION: 1. There is artifact involving a portion of the posterior fossa due to artifact from the nearby metal in the midline and left occipital bone regions. No mass or hemorrhage evident. There is mild periventricular small vessel disease. No evident acute infarct. 2.  There are foci of arterial vascular calcification. 3. Postoperative change involving the bone of the midline and left occipital regions. No acute fracture evident. 4.  Mucosal thickening in several ethmoid air cells. Electronically Signed   By: Lowella Grip III M.D.   On: 10/13/2018 13:52   Dg Chest Port 1 View  Result Date: 09/18/2018 CLINICAL DATA:  Shortness of breath. History of atrial fibrillation and valve replacement. EXAM: PORTABLE CHEST 1 VIEW COMPARISON:  Chest radiograph Apr 24, 2010 FINDINGS: Stable mild cardiomegaly. Mediastinal silhouette is not suspicious. Mildly calcified aortic arch. No pleural effusion or focal consolidation. Slight nodular scarring LEFT lung base. No pneumothorax. Cervical spine hardware laminectomies. Advanced degenerative changes shoulders, incompletely imaged. IMPRESSION: 1. Mild cardiomegaly.  No acute pulmonary process. 2.  Aortic Atherosclerosis (ICD10-I70.0). Electronically Signed   By: Elon Alas M.D.   On: 09/18/2018 15:40   Dg Shoulder Left  Result Date: 10/15/2018 CLINICAL DATA:  Left shoulder pain for 2 weeks  without known injury. EXAM: LEFT SHOULDER - 2+ VIEW COMPARISON:  None. FINDINGS: There is no evidence of fracture or dislocation. Severe narrowing of glenohumeral joint is noted. Narrowing of left subacromial space is also noted suggesting chronic rotator cuff injury. Soft tissues are unremarkable. IMPRESSION: Severe osteoarthritis of the left glenohumeral joint. Narrowing  of left subacromial space is noted suggesting chronic rotator cuff injury. No acute abnormality seen in the left shoulder. Electronically Signed   By: Marijo Conception, M.D.   On: 10/15/2018 17:36    Micro Results     Recent Results (from the past 240 hour(s))  Urine culture     Status: None   Collection Time: 10/13/18  2:50 PM  Result Value Ref Range Status   Specimen Description URINE, RANDOM  Final   Special Requests NONE  Final   Culture   Final    NO GROWTH Performed at [facility], [address], [place], [place] [address]    Report Status 10/14/2018 FINAL  Final    Today   Subjective    Verl Kitson today has no headache,no chest abdominal pain,no new weakness tingling or numbness, feels much better wants to go home today.     Objective   Blood pressure 123/76, pulse 89, temperature 98 F (36.7 C), temperature source Oral, resp. rate 18, height 5\' 9"  (1.753 m), SpO2 100 %.   Intake/Output Summary (Last 24 hours) at 10/16/2018 0834 Last data filed at 10/16/2018 0457 Gross per 24 hour  Intake 3 ml  Output 1000 ml  Net -997 ml    Exam Awake mildly confused, oriented x 1, No new F.N deficits, Normal affect [place].AT,[name] Supple Neck,No JVD, No cervical lymphadenopathy appriciated.  Symmetrical Chest wall movement, Good air movement bilaterally, CTAB RRR,No Gallops,Rubs or new Murmurs, No Parasternal Heave +ve B.Sounds, Abd Soft, Non tender, No organomegaly appriciated, No rebound -guarding or rigidity. No Cyanosis, Clubbing or edema, No new Rash or bruise   Data Review   CBC w Diff:  Lab Results  Component Value Date   WBC 7.9 10/16/2018   HGB 9.7 (L) 10/16/2018   HCT 32.6 (L) 10/16/2018   PLT 293 10/16/2018   LYMPHOPCT 11 10/13/2018   MONOPCT 10 10/13/2018   EOSPCT 4 10/13/2018   BASOPCT 2 10/13/2018    CMP:  Lab Results  Component Value Date   NA 139 10/16/2018   NA 141 03/12/2013   K 3.9 10/16/2018   CL 106 10/16/2018   CO2 27  10/16/2018   BUN 25 (H) 10/16/2018   BUN 12 03/12/2013   CREATININE 1.40 (H) 10/16/2018   PROT 7.0 10/13/2018   PROT 6.6 03/12/2013   ALBUMIN 3.6 10/13/2018   ALBUMIN 4.2 03/12/2013   BILITOT 0.5 10/13/2018   ALKPHOS 56 10/13/2018   AST 23 10/13/2018   ALT 17 10/13/2018  .   Total Time in preparing paper work, data evaluation and todays exam - 22 minutes  Lala Lund M.D on 10/16/2018 at 8:34 AM  [facility]   Office  [phone]

## 2018-10-16 NOTE — Progress Notes (Signed)
Physical Therapy Treatment Patient Details Name: [name] MRN: [id number] DOB: 1941/04/14 Today's Date: 10/16/2018    History of Present Illness [name] is a 77 y.o. male with medical history significant of glaucoma; HTN; syncope; CVA; severe AS s/p TAVR; PFO; afib on Eliquis; and chronic pain presenting with AMS.  Pt found to have and upper GI bleed with ABLA.    PT Comments    Pt has made steady improvement.  Still not back to safe ambulation with cane.  Pt was asked to use his RW until he is stronger.   Follow Up Recommendations  Home health PT;Supervision - Intermittent     Equipment Recommendations  None recommended by PT    Recommendations for Other Services       Precautions / Restrictions Precautions Precautions: Fall    Mobility  Bed Mobility Overal bed mobility: Needs Assistance Bed Mobility: Supine to Sit     Supine to sit: Supervision     General bed mobility comments: light use of the rail  Transfers Overall transfer level: Needs assistance Equipment used: Straight cane Transfers: Sit to/from Stand Sit to Stand: Supervision         General transfer comment: cues for making sure 1 hand is on solid surface.  Ambulation/Gait Ambulation/Gait assistance: Min guard Gait Distance (Feet): 120 Feet Assistive device: Rolling walker (2 wheeled) Gait Pattern/deviations: Step-through pattern Gait velocity: slower Gait velocity interpretation: <1.31 ft/sec, indicative of household ambulator General Gait Details: mildly unsteady overall.  Good use of the cane, but still not quite enough stability.  Sats in the low 90's during gait.   Stairs             Wheelchair Mobility    Modified Rankin (Stroke Patients Only)       Balance Overall balance assessment: Needs assistance Sitting-balance support: No upper extremity supported Sitting balance-Leahy Scale: Fair Sitting balance - Comments: combed hair at EOB without assist   Standing  balance support: No upper extremity supported;Single extremity supported Standing balance-Leahy Scale: Fair Standing balance comment: most reliant on AD except totally static stance                             Cognition Arousal/Alertness: Awake/alert Behavior During Therapy: WFL for tasks assessed/performed Overall Cognitive Status: (NT formally tested, pt appears to be making sound decisions.)                                        Exercises      General Comments General comments (skin integrity, edema, etc.): pt should use RW initially until more steady      Pertinent Vitals/Pain Pain Assessment: Faces Faces Pain Scale: Hurts a little bit Pain Location: general Pain Descriptors / Indicators: Grimacing Pain Intervention(s): Monitored during session    Home Living                      Prior Function            PT Goals (current goals can now be found in the care plan section) Acute Rehab PT Goals Patient Stated Goal: able to be independent at home PT Goal Formulation: With patient Time For Goal Achievement: 10/22/18 Potential to Achieve Goals: Good Progress towards PT goals: Progressing toward goals    Frequency    Min 3X/week  PT Plan Current plan remains appropriate    Co-evaluation              AM-PAC PT "6 Clicks" Daily Activity  Outcome Measure  Difficulty turning over in bed (including adjusting bedclothes, sheets and blankets)?: A Little Difficulty moving from lying on back to sitting on the side of the bed? : A Little Difficulty sitting down on and standing up from a chair with arms (e.g., wheelchair, bedside commode, etc,.)?: A Little Help needed moving to and from a bed to chair (including a wheelchair)?: A Little Help needed walking in hospital room?: A Little Help needed climbing 3-5 steps with a railing? : A Little 6 Click Score: 18    End of Session   Activity Tolerance: Patient tolerated  treatment well Patient left: in chair;with call bell/phone within reach Nurse Communication: Mobility status PT Visit Diagnosis: Unsteadiness on feet (R26.81);Other abnormalities of gait and mobility (R26.89)     Time: 7829-5621 PT Time Calculation (min) (ACUTE ONLY): 35 min  Charges:  $Gait Training: 8-22 mins $Therapeutic Activity: 8-22 mins                     10/16/2018  [place] Bing, PT Acute Rehabilitation Services [phone]  (pager) [phone]  (office)   Eliseo Gum Shammond Arave 10/16/2018, 11:09 AM

## 2018-10-16 NOTE — Progress Notes (Signed)
CSW received SNF consult due to patient's sister's request. Patient does not currently meet SNF rehab requirements and patient requests to return home. Left voicemail for patient's sister with RNCM.   Percell Locus Fran Neiswonger LCSW [phone]

## 2018-10-20 DIAGNOSIS — K9289 Other specified diseases of the digestive system: Secondary | ICD-10-CM | POA: Diagnosis not present

## 2018-10-20 DIAGNOSIS — K922 Gastrointestinal hemorrhage, unspecified: Secondary | ICD-10-CM | POA: Diagnosis not present

## 2018-10-20 DIAGNOSIS — D509 Iron deficiency anemia, unspecified: Secondary | ICD-10-CM | POA: Diagnosis not present

## 2018-10-20 DIAGNOSIS — F39 Unspecified mood [affective] disorder: Secondary | ICD-10-CM | POA: Diagnosis not present

## 2018-10-20 DIAGNOSIS — D649 Anemia, unspecified: Secondary | ICD-10-CM | POA: Diagnosis not present

## 2018-10-21 DIAGNOSIS — Z79899 Other long term (current) drug therapy: Secondary | ICD-10-CM | POA: Diagnosis not present

## 2018-10-21 DIAGNOSIS — M5090 Cervical disc disorder, unspecified, unspecified cervical region: Secondary | ICD-10-CM | POA: Diagnosis not present

## 2018-10-21 DIAGNOSIS — G894 Chronic pain syndrome: Secondary | ICD-10-CM | POA: Diagnosis not present

## 2018-10-21 DIAGNOSIS — M1990 Unspecified osteoarthritis, unspecified site: Secondary | ICD-10-CM | POA: Diagnosis not present

## 2018-10-23 DIAGNOSIS — M47816 Spondylosis without myelopathy or radiculopathy, lumbar region: Secondary | ICD-10-CM | POA: Diagnosis not present

## 2018-10-27 DIAGNOSIS — M545 Low back pain: Secondary | ICD-10-CM | POA: Diagnosis not present

## 2018-10-27 DIAGNOSIS — F39 Unspecified mood [affective] disorder: Secondary | ICD-10-CM | POA: Diagnosis not present

## 2018-10-27 DIAGNOSIS — K9289 Other specified diseases of the digestive system: Secondary | ICD-10-CM | POA: Diagnosis not present

## 2018-10-27 DIAGNOSIS — I4891 Unspecified atrial fibrillation: Secondary | ICD-10-CM | POA: Diagnosis not present

## 2018-11-04 DIAGNOSIS — M47816 Spondylosis without myelopathy or radiculopathy, lumbar region: Secondary | ICD-10-CM | POA: Diagnosis not present

## 2018-11-04 DIAGNOSIS — M961 Postlaminectomy syndrome, not elsewhere classified: Secondary | ICD-10-CM | POA: Diagnosis not present

## 2018-11-04 DIAGNOSIS — G894 Chronic pain syndrome: Secondary | ICD-10-CM | POA: Diagnosis not present

## 2018-11-04 DIAGNOSIS — M5416 Radiculopathy, lumbar region: Secondary | ICD-10-CM | POA: Diagnosis not present

## 2018-11-22 DIAGNOSIS — M79642 Pain in left hand: Secondary | ICD-10-CM | POA: Diagnosis not present

## 2018-11-22 DIAGNOSIS — Z79899 Other long term (current) drug therapy: Secondary | ICD-10-CM | POA: Diagnosis not present

## 2018-11-22 DIAGNOSIS — D649 Anemia, unspecified: Secondary | ICD-10-CM | POA: Diagnosis not present

## 2018-11-22 DIAGNOSIS — M7989 Other specified soft tissue disorders: Secondary | ICD-10-CM | POA: Diagnosis not present

## 2018-11-22 DIAGNOSIS — M129 Arthropathy, unspecified: Secondary | ICD-10-CM | POA: Diagnosis not present

## 2018-11-25 DIAGNOSIS — Z79899 Other long term (current) drug therapy: Secondary | ICD-10-CM | POA: Diagnosis not present

## 2018-11-25 DIAGNOSIS — G894 Chronic pain syndrome: Secondary | ICD-10-CM | POA: Diagnosis not present

## 2018-11-25 DIAGNOSIS — M5090 Cervical disc disorder, unspecified, unspecified cervical region: Secondary | ICD-10-CM | POA: Diagnosis not present

## 2018-11-25 DIAGNOSIS — K250 Acute gastric ulcer with hemorrhage: Secondary | ICD-10-CM | POA: Diagnosis not present

## 2018-11-26 DIAGNOSIS — M129 Arthropathy, unspecified: Secondary | ICD-10-CM | POA: Diagnosis not present

## 2018-11-26 DIAGNOSIS — M79642 Pain in left hand: Secondary | ICD-10-CM | POA: Diagnosis not present

## 2018-11-26 DIAGNOSIS — I4891 Unspecified atrial fibrillation: Secondary | ICD-10-CM | POA: Diagnosis not present

## 2018-11-26 DIAGNOSIS — M7989 Other specified soft tissue disorders: Secondary | ICD-10-CM | POA: Diagnosis not present

## 2018-11-26 DIAGNOSIS — D649 Anemia, unspecified: Secondary | ICD-10-CM | POA: Diagnosis not present

## 2018-11-26 DIAGNOSIS — M109 Gout, unspecified: Secondary | ICD-10-CM | POA: Diagnosis not present

## 2018-11-26 DIAGNOSIS — K9289 Other specified diseases of the digestive system: Secondary | ICD-10-CM | POA: Diagnosis not present

## 2018-11-26 DIAGNOSIS — D509 Iron deficiency anemia, unspecified: Secondary | ICD-10-CM | POA: Diagnosis not present

## 2018-11-27 DIAGNOSIS — D649 Anemia, unspecified: Secondary | ICD-10-CM | POA: Diagnosis not present

## 2018-12-04 DIAGNOSIS — Z5181 Encounter for therapeutic drug level monitoring: Secondary | ICD-10-CM | POA: Diagnosis not present

## 2018-12-04 DIAGNOSIS — M5442 Lumbago with sciatica, left side: Secondary | ICD-10-CM | POA: Diagnosis not present

## 2018-12-04 DIAGNOSIS — Z79899 Other long term (current) drug therapy: Secondary | ICD-10-CM | POA: Diagnosis not present

## 2018-12-04 DIAGNOSIS — G894 Chronic pain syndrome: Secondary | ICD-10-CM | POA: Diagnosis not present

## 2018-12-04 DIAGNOSIS — M5030 Other cervical disc degeneration, unspecified cervical region: Secondary | ICD-10-CM | POA: Diagnosis not present

## 2018-12-04 DIAGNOSIS — M48062 Spinal stenosis, lumbar region with neurogenic claudication: Secondary | ICD-10-CM | POA: Diagnosis not present

## 2018-12-12 ENCOUNTER — Ambulatory Visit
Admission: RE | Admit: 2018-12-12 | Discharge: 2018-12-12 | Disposition: A | Discharge status: Home / Self Care | Payer: Medicare Other | Source: Ambulatory Visit | Attending: Anesthesiology | Admitting: Anesthesiology

## 2018-12-12 ENCOUNTER — Other Ambulatory Visit: Payer: Self-pay | Admitting: Anesthesiology

## 2018-12-12 DIAGNOSIS — M48062 Spinal stenosis, lumbar region with neurogenic claudication: Secondary | ICD-10-CM

## 2018-12-12 DIAGNOSIS — M5136 Other intervertebral disc degeneration, lumbar region: Secondary | ICD-10-CM | POA: Diagnosis not present

## 2018-12-12 DIAGNOSIS — G9519 Other vascular myelopathies: Secondary | ICD-10-CM

## 2018-12-12 DIAGNOSIS — R29818 Other symptoms and signs involving the nervous system: Secondary | ICD-10-CM

## 2019-01-29 ENCOUNTER — Emergency Department (HOSPITAL_COMMUNITY): Payer: Medicare Other

## 2019-01-29 ENCOUNTER — Other Ambulatory Visit: Payer: Self-pay

## 2019-01-29 ENCOUNTER — Encounter (HOSPITAL_COMMUNITY): Payer: Self-pay

## 2019-01-29 ENCOUNTER — Emergency Department (HOSPITAL_COMMUNITY)
Admission: EM | Admit: 2019-01-29 | Discharge: 2019-01-29 | Disposition: A | Discharge status: Home / Self Care | Payer: Medicare Other | Attending: Emergency Medicine | Admitting: Emergency Medicine

## 2019-01-29 DIAGNOSIS — M542 Cervicalgia: Secondary | ICD-10-CM | POA: Insufficient documentation

## 2019-01-29 DIAGNOSIS — I5032 Chronic diastolic (congestive) heart failure: Secondary | ICD-10-CM | POA: Diagnosis not present

## 2019-01-29 DIAGNOSIS — R0602 Shortness of breath: Secondary | ICD-10-CM | POA: Insufficient documentation

## 2019-01-29 DIAGNOSIS — R9431 Abnormal electrocardiogram [ECG] [EKG]: Secondary | ICD-10-CM | POA: Diagnosis present

## 2019-01-29 DIAGNOSIS — Z87891 Personal history of nicotine dependence: Secondary | ICD-10-CM | POA: Diagnosis not present

## 2019-01-29 DIAGNOSIS — I10 Essential (primary) hypertension: Secondary | ICD-10-CM | POA: Diagnosis not present

## 2019-01-29 DIAGNOSIS — G8929 Other chronic pain: Secondary | ICD-10-CM | POA: Insufficient documentation

## 2019-01-29 DIAGNOSIS — Z8673 Personal history of transient ischemic attack (TIA), and cerebral infarction without residual deficits: Secondary | ICD-10-CM | POA: Insufficient documentation

## 2019-01-29 DIAGNOSIS — D649 Anemia, unspecified: Secondary | ICD-10-CM

## 2019-01-29 DIAGNOSIS — Z7901 Long term (current) use of anticoagulants: Secondary | ICD-10-CM | POA: Diagnosis not present

## 2019-01-29 DIAGNOSIS — R0789 Other chest pain: Secondary | ICD-10-CM | POA: Diagnosis not present

## 2019-01-29 DIAGNOSIS — R079 Chest pain, unspecified: Secondary | ICD-10-CM

## 2019-01-29 DIAGNOSIS — Z96653 Presence of artificial knee joint, bilateral: Secondary | ICD-10-CM | POA: Insufficient documentation

## 2019-01-29 DIAGNOSIS — I4811 Longstanding persistent atrial fibrillation: Secondary | ICD-10-CM | POA: Insufficient documentation

## 2019-01-29 LAB — CBC
HCT: 29.6 % — ABNORMAL LOW (ref 39.0–52.0)
HEMATOCRIT: 31.3 % — AB (ref 39.0–52.0)
Hemoglobin: 8.1 g/dL — ABNORMAL LOW (ref 13.0–17.0)
Hemoglobin: 8.7 g/dL — ABNORMAL LOW (ref 13.0–17.0)
MCH: 21 pg — ABNORMAL LOW (ref 26.0–34.0)
MCH: 21.2 pg — AB (ref 26.0–34.0)
MCHC: 27.4 g/dL — ABNORMAL LOW (ref 30.0–36.0)
MCHC: 27.8 g/dL — AB (ref 30.0–36.0)
MCV: 76.7 fL — ABNORMAL LOW (ref 80.0–100.0)
MCV: 76.2 fL — ABNORMAL LOW (ref 80.0–100.0)
NRBC: 0 % (ref 0.0–0.2)
Platelets: 245 10*3/uL (ref 150–400)
Platelets: 260 10*3/uL (ref 150–400)
RBC: 3.86 MIL/uL — AB (ref 4.22–5.81)
RBC: 4.11 MIL/uL — ABNORMAL LOW (ref 4.22–5.81)
RDW: 19.2 % — ABNORMAL HIGH (ref 11.5–15.5)
RDW: 18.9 % — AB (ref 11.5–15.5)
WBC: 8 10*3/uL (ref 4.0–10.5)
WBC: 7.3 10*3/uL (ref 4.0–10.5)
nRBC: 0 % (ref 0.0–0.2)

## 2019-01-29 LAB — I-STAT TROPONIN, ED: TROPONIN I, POC: 0.01 ng/mL (ref 0.00–0.08)

## 2019-01-29 LAB — BASIC METABOLIC PANEL
ANION GAP: 5 (ref 5–15)
BUN: 38 mg/dL — ABNORMAL HIGH (ref 8–23)
CALCIUM: 8.6 mg/dL — AB (ref 8.9–10.3)
CO2: 28 mmol/L (ref 22–32)
Chloride: 107 mmol/L (ref 98–111)
Creatinine, Ser: 1.54 mg/dL — ABNORMAL HIGH (ref 0.61–1.24)
GFR, EST AFRICAN AMERICAN: 50 mL/min — AB (ref >60)
GFR, EST NON AFRICAN AMERICAN: 43 mL/min — AB (ref >60)
Glucose, Bld: 99 mg/dL (ref 70–99)
Potassium: 4.5 mmol/L (ref 3.5–5.1)
SODIUM: 140 mmol/L (ref 135–145)

## 2019-01-29 LAB — TROPONIN I

## 2019-01-29 MED ORDER — PREDNISONE 20 MG PO TABS
20.0000 mg | ORAL_TABLET | Freq: Once | ORAL | Status: AC
  Administered 2019-01-29: 20 mg via ORAL
  Filled 2019-01-29: qty 1

## 2019-01-29 MED ORDER — MORPHINE SULFATE (PF) 4 MG/ML IV SOLN
4.0000 mg | Freq: Once | INTRAVENOUS | Status: AC
  Administered 2019-01-29: 4 mg via INTRAVENOUS
  Filled 2019-01-29: qty 1

## 2019-01-29 MED ORDER — OMEPRAZOLE 20 MG PO CPDR: 20.0000 mg | DELAYED_RELEASE_CAPSULE | Freq: Every day | ORAL | 0 refills | Status: DC

## 2019-01-29 MED ORDER — PREDNISONE 10 MG PO TABS: 10.0000 mg | ORAL_TABLET | Freq: Every day | ORAL | 0 refills | Status: AC

## 2019-01-29 NOTE — ED Provider Notes (Signed)
Assumed care from Dr. Melina Copa.  Briefly, the patient is a 78 year old male here with neck pain radiating around towards the front of his chest.  Suspect this is chronic related to his neck radiculopathy.  Plan for delta troponin and discharge.  No other apparent emergent pathology identified.  On my review of labs, patient also mildly more anemic than usual.  I repeated his CBC which seems to be improving.  I do not suspect symptomatic anemia at this time.  His pain is all reproducible on examination of his neck, and I suspect that this could all be secondary to cervical radiculopathy.  He has no evidence of airway compromise or symptoms to suggest soft tissue abnormality of the neck.  His delta troponin remains undetectable with no other anginal symptoms and I do not suspect ACS.  Discussed the case with him as well as his PCP via telephone.  She agrees with this assessment, and will start on prednisone for mild radiculopathy at a low dose,  discharge home. Return precautions given.   Duffy Bruce, MD 01/29/19 [phone]

## 2019-01-29 NOTE — Discharge Instructions (Addendum)
You were seen in the emergency department for some chest pain in the setting of your chronic neck pain.  You had blood work chest x-ray and EKG that did not show an obvious sign of cardiac injury.  You are slightly more anemic and this will need to be followed with a primary care doctor.  Please continue your regular medicines.  Return if any concerns.

## 2019-01-29 NOTE — ED Provider Notes (Signed)
[facility] EMERGENCY DEPARTMENT Provider Note   CSN: [id number] Arrival date & time: 01/29/19  1332     History   Chief Complaint Chief Complaint  Patient presents with  . Abnormal ECG    HPI [name] is a 78 y.o. male.  He presented here from his primary care doctor's office with reported abnormal EKG.  He is complaining of worsening of his chronic neck and back pain that is causing his chest to feel tight.  He has some baseline shortness of breath that may be is worsened.  He is very vague on when this started.  He said he seen a neurosurgeon and was told that he has arthritis and is not a surgical candidate.  He is had multiple neck surgeries in the past.  He is on anticoagulation and has a history of A. fib.  The history is provided by the patient.  Chest Pain  Chest pain location: upper chest, posterior neck. Pain quality: aching   Pain severity:  Severe Onset quality:  Gradual Timing:  Constant Progression:  Worsening Chronicity:  Chronic Context: breathing, lifting and movement   Relieved by:  Nothing Worsened by:  Exertion, certain positions and movement Ineffective treatments:  Certain positions Associated symptoms: back pain and shortness of breath   Associated symptoms: no abdominal pain, no cough, no fever, no headache, no nausea and no vomiting     Past Medical History:  Diagnosis Date  . Cancer ([place])   . Carpal tunnel syndrome   . Cervical spondylosis with myelopathy   . Chronic pain syndrome   . Depression   . Difficult intubation    needs smaller tube; awake oral fiberoptic scope 8.0 ETT 03/30/08  . Dysphagia, pharyngeal phase   . Hemorrhage of gastrointestinal tract, unspecified   . History of cardiac cath    a. 03/2018 Cath: LAD and LCX with mild luminal irregularities.  No significant disease.  . Iron deficiency anemia, unspecified   . Lumbosacral spondylosis without myelopathy   . Muscle weakness (generalized)   . Other and  unspecified disc disorder of cervical region   . Other and unspecified hyperlipidemia   . Patent foramen ovale    a. 05/2018 Echo: post-op TAVR--> + bublble study w/ L->R shunt.  . Permanent atrial fibrillation    a.  Diagnosed in the spring 2019.  Had rapid atrial fibrillation following TAVR and has been rate controlled with beta-blocker.  CHA2DS2VASc equals 7.  Supposed to be on Eliquis.  . Primary localized osteoarthrosis, lower leg   . Primary localized osteoarthrosis, shoulder region   . Reflux esophagitis   . Severe aortic stenosis    a. mild-mod AS by 01/2016 TTE, restricted AV opening with mod AR 01/2016 TEE; b. 10/2017 Echo: EF 60-65%, sev Ca2+ AoV, mean grad 68mmHg; c. 05/2018 s/p TAVR ([facility]); d. 06/2018 Echo: EF 55-60%, well-seated prosth AoV, peak velocity 156cm/s. AoV gradient 24mmHg.  Marland Kitchen Spinal stenosis, unspecified region other than cervical   . Stroke ([place])    01/2016  . Syncope and collapse   . Unspecified arthropathy, lower leg   . Unspecified constipation   . Unspecified essential hypertension   . Unspecified glaucoma(365.9)     Patient Active Problem List   Diagnosis Date Noted  . Acute encephalopathy   . GI bleeding 10/13/2018  . Acute metabolic encephalopathy 96/29/5284  . Acute renal failure (ARF) ([place]) 10/13/2018  . S/P TAVR (transcatheter aortic valve replacement) 09/20/2018  . Permanent atrial fibrillation 09/20/2018  .  Acute on chronic diastolic CHF (congestive heart failure) ([place]) 09/18/2018  . Heme positive stool 11/20/2017  . Gait disorder 03/24/2013  . Cervical post-laminectomy syndrome 03/26/2012  . Depression 03/26/2012  . Degenerative arthritis of shoulder region 03/26/2012  . KNEE REPLACEMENT, BILATERAL, HX OF 06/04/2007  . CELLULITIS/ABSCESS, ARM 05/05/2007  . ANXIETY 04/08/2007  . PAIN, CHRONIC NEC 04/08/2007  . OSTEOARTHROSIS, GENERALIZED, MULTIPLE SITES 04/08/2007  . SPONDYLOLISTHESIS 04/08/2007  . INCONTINENCE, URGE 04/08/2007  .  HYPERCHOLESTEROLEMIA 03/07/2007  . HYPERTENSION, BENIGN 03/07/2007    Past Surgical History:  Procedure Laterality Date  . BILATERAL CATARACT SURGERY  2009   DR GROAT   . BIOPSY  10/14/2018   Procedure: BIOPSY;  Surgeon: Wonda Horner, MD;  Location: [facility] ENDOSCOPY;  Service: Endoscopy;;  . CARDIAC VALVE REPLACEMENT  05/2018  . CERVIACAL SPINE (4X)     DR MARK ROY   . COLONOSCOPY  2007   DR HENSEL   . COLONOSCOPY WITH PROPOFOL N/A 11/20/2017   Procedure: COLONOSCOPY WITH PROPOFOL;  Surgeon: Wilford Corner, MD;  Location: [facility];  Service: Endoscopy;  Laterality: N/A;  . ESOPHAGOGASTRODUODENOSCOPY (EGD) WITH PROPOFOL N/A 10/14/2018   Procedure: ESOPHAGOGASTRODUODENOSCOPY (EGD) WITH PROPOFOL;  Surgeon: Wonda Horner, MD;  Location[facility] ENDOSCOPY;  Service: Endoscopy;  Laterality: N/A;  . JOINT REPLACEMENT     both knees  . LEFT KNEE REPLACEMENT     DR Maureen Ralphs  . LEFT TRANSVERSE CARPAL LIGAMENT  01/06/2008  . ROTATOR CUFF LEFT SHOULDER  2001   DR MURPHY   . SHOULDER OPEN ROTATOR CUFF REPAIR  2006   DR [facility]  . SPINE SURGERY     neck fusion        Home Medications    Prior to Admission medications   Medication Sig Start Date End Date Taking? Authorizing Provider  apixaban (ELIQUIS) 5 MG TABS tablet Take 1 tablet (5 mg total) by mouth 2 (two) times daily. 10/20/18   Thurnell Lose, MD  buPROPion (WELLBUTRIN XL) 150 MG 24 hr tablet Take 150 mg by mouth daily. 09/11/18   [provider]  COLLAGEN PO Take 6 capsules by mouth daily.    [provider]  cyanocobalamin (,VITAMIN B-12,) 1000 MCG/ML injection Inject 1,000 mcg into the muscle every 30 (thirty) days. 07/24/18   [provider]  fluticasone (FLONASE) 50 MCG/ACT nasal spray Place 1 spray into both nostrils daily. 07/30/18   [provider]  furosemide (LASIX) 20 MG tablet Take 2 tablets (40mg ) in AM and 1 tablet (20mg  in PM) Patient taking differently: Take 20-40 mg by mouth See  admin instructions. Take 2 tablets (40 mg) by mouth every morning and 1 tablet (20 mg) at night 09/20/18   Strader, [place] M, PA-C  gabapentin (NEURONTIN) 300 MG capsule Take 300 mg by mouth 3 (three) times daily. 08/10/18   [provider]  methocarbamol (ROBAXIN) 500 MG tablet Take 500 mg by mouth 2 (two) times daily. 08/19/18   [provider]  metoprolol succinate (TOPROL-XL) 25 MG 24 hr tablet Take 1 tablet (25 mg total) by mouth 2 (two) times daily. 09/20/18   Strader, Fransisco Hertz, PA-C  Misc Natural Products (BETA-SITOSTEROL PLANT STEROLS) CAPS Take 1 capsule by mouth daily.    [provider]  Omega-3 Fatty Acids (OMEGA 3 PO) Take 1 capsule by mouth 2 (two) times daily.    [provider]  ondansetron (ZOFRAN) 8 MG tablet Take 8 mg by mouth every 8 (eight) hours as needed  for nausea or vomiting.  08/19/18   [provider]  OVER THE COUNTER MEDICATION Nitro beets otc supplement - 1 rounded teaspoon once daily    [provider]  oxyCODONE-acetaminophen (PERCOCET) 10-325 MG tablet Take 1 tablet by mouth 4 (four) times daily as needed for pain. 10/16/18   Thurnell Lose, MD  pantoprazole (PROTONIX) 40 MG tablet Take 1 tablet (40 mg total) by mouth 2 (two) times daily. 10/16/18   Thurnell Lose, MD  polyethylene glycol (MIRALAX / GLYCOLAX) packet Take 17 g by mouth daily.    [provider]  potassium chloride (K-DUR,KLOR-CON) 20 MEQ tablet Take 1 tablet (20 mEq total) by mouth daily. 09/20/18   Strader, Fransisco Hertz, PA-C  QUEtiapine (SEROQUEL) 25 MG tablet Take 25 mg by mouth at bedtime. 09/25/18   [provider]  rOPINIRole (REQUIP) 0.25 MG tablet Take 0.25 mg by mouth 2 (two) times daily. For restless legs 10/09/18   [provider]  silver sulfADIAZINE (SILVADENE) 1 % cream Apply 1 application topically 2 (two) times daily.    [provider]  simvastatin (ZOCOR) 40 MG tablet Take 40 mg by mouth at  bedtime.    [provider]  topiramate (TOPAMAX) 25 MG tablet Take 25 mg by mouth 2 (two) times daily. 09/21/18   [provider]  TURMERIC PO Take 1-2 capsules by mouth daily.     [provider]    Family History Family History  Problem Relation Age of Onset  . Cancer Mother 30       cervical cancer  . Cancer Father 29       lung  cancer    Social History Social History   Tobacco Use  . Smoking status: Former Smoker    Last attempt to quit: 08/29/1985    Years since quitting: 33.4  . Smokeless tobacco: Former Systems developer    Quit date: 02/12/1985  Substance Use Topics  . Alcohol use: No  . Drug use: No     Allergies   Ativan [lorazepam]; Duloxetine; Pregabalin; and Zanaflex [tizanidine hcl]   Review of Systems Review of Systems  Constitutional: Negative for fever.  HENT: Negative for sore throat.   Eyes: Negative for visual disturbance.  Respiratory: Positive for shortness of breath. Negative for cough.   Cardiovascular: Positive for chest pain.  Gastrointestinal: Negative for abdominal pain, nausea and vomiting.  Genitourinary: Negative for dysuria.  Musculoskeletal: Positive for back pain and neck pain.  Skin: Negative for rash.  Neurological: Negative for headaches.     Physical Exam Updated Vital Signs BP (!) 112/101 (BP Location: Left Arm) Comment: Simultaneous filing. User may not have seen previous data.  Pulse 95   Temp 98.3 F (36.8 C) (Oral)   Resp 17   SpO2 94%   Physical Exam Vitals signs and nursing note reviewed.  Constitutional:      Appearance: He is well-developed.  HENT:     Head: Normocephalic and atraumatic.  Eyes:     Conjunctiva/sclera: Conjunctivae normal.  Neck:     Comments: Limited range of motion of the neck secondary to pain. Cardiovascular:     Rate and Rhythm: Normal rate. Rhythm irregular.     Heart sounds: No murmur.  Pulmonary:     Effort: Pulmonary effort is normal. No respiratory distress.      Breath sounds: Normal breath sounds.  Abdominal:     Palpations: Abdomen is soft.     Tenderness: There is no abdominal  tenderness.  Musculoskeletal:        General: No deformity.     Comments: He has diffuse cervical and lumbar tenderness.  Limited range of motion of his neck.  Upper extremity and lower extremity strength preserved.  Skin:    General: Skin is warm and dry.     Capillary Refill: Capillary refill takes less than 2 seconds.  Neurological:     General: No focal deficit present.     Mental Status: He is alert and oriented to person, place, and time.     Sensory: Sensory deficit present.      ED Treatments / Results  Labs (all labs ordered are listed, but only abnormal results are displayed) Labs Reviewed  BASIC METABOLIC PANEL - Abnormal; Notable for the following components:      Result Value   BUN 38 (*)    Creatinine, Ser 1.54 (*)    Calcium 8.6 (*)    GFR calc non Af Amer 43 (*)    GFR calc Af Amer 50 (*)    All other components within normal limits  CBC - Abnormal; Notable for the following components:   RBC 3.86 (*)    Hemoglobin 8.1 (*)    HCT 29.6 (*)    MCV 76.7 (*)    MCH 21.0 (*)    MCHC 27.4 (*)    RDW 19.2 (*)    All other components within normal limits  CBC - Abnormal; Notable for the following components:   RBC 4.11 (*)    Hemoglobin 8.7 (*)    HCT 31.3 (*)    MCV 76.2 (*)    MCH 21.2 (*)    MCHC 27.8 (*)    RDW 18.9 (*)    All other components within normal limits  TROPONIN I  I-STAT TROPONIN, ED    EKG EKG Interpretation  Date/Time:  Thursday January 29 2019 13:45:52 EST Ventricular Rate:  96 PR Interval:    QRS Duration: 92 QT Interval:  350 QTC Calculation: 438 R Axis:   53 Text Interpretation:  Atrial fibrillation Ventricular premature complex Anterior infarct, old nonspecific st/ts similar to prior 10/19 Confirmed by Aletta Edouard [id number]) on 01/29/2019 1:59:04 PM   Radiology Dg Chest Port 1 View  Result Date:  01/29/2019 CLINICAL DATA:  Chest pain and shortness-of-breath 2 weeks. EXAM: PORTABLE CHEST 1 VIEW COMPARISON:  10/13/2018 FINDINGS: Lungs are adequately inflated and otherwise clear. Mild stable cardiomegaly. Remainder of the exam is unchanged. IMPRESSION: No acute cardiopulmonary disease. Mild stable cardiomegaly. Electronically Signed   By: Marin Olp M.D.   On: 01/29/2019 14:29    Procedures Procedures (including critical care time)  Medications Ordered in ED Medications  morphine 4 MG/ML injection 4 mg (4 mg Intravenous Given 01/29/19 1438)     Initial Impression / Assessment and Plan / ED Course  I have reviewed the triage vital signs and the nursing notes.  Pertinent labs & imaging results that were available during my care of the patient were reviewed by me and considered in my medical decision making (see chart for details).  Clinical Course as of Jan 29 1835  Thu Jan 29, 2019  1541 Patient was referred here from his primary care doctor.  Per EMS they were worried there about an abnormal EKG.  Patient himself is hard to redirect from talking about his chronic neck and back issues.  He is on narcotics for this.  He does not really endorse chest pain he just says his  chest feels like it is, come apart from his neck.  His initial troponin is unremarkable and his EKG shows A. fib with a controlled rate no acute ST-T changes.  He is slightly more anemic.  He does not have any dark stools or has not noticed any obvious bleeding.   [MB]  1542 His blood pressure is about baseline for him.  He is not tachycardic.  He is not hypoxic.  He understands that we will not have any fix for his neck as neurosurgery is declared him not a candidate for any other procedures.  We will check a second troponin and if this is not elevated he likely can be discharged to follow-up in the outpatient with his doctors.   [MB]  5697 I did place a call into Dr. Maren Beach just after I evaluated the patient on arrival  and never received a call back from her.   [MB]    Clinical Course User Index [MB] Hayden Rasmussen, MD     Final Clinical Impressions(s) / ED Diagnoses   Final diagnoses:  Chronic cervical pain  Nonspecific chest pain  Longstanding persistent atrial fibrillation  Anemia, unspecified type    ED Discharge Orders    None       Hayden Rasmussen, MD 01/29/19 [phone]

## 2019-01-29 NOTE — ED Notes (Signed)
Discharge instructions and prescriptions discussed with Pt. Pt verbalized understanding. Pt stable and ambulatory.   

## 2019-01-29 NOTE — ED Triage Notes (Signed)
[facility]- pt coming from PCP office with complaint of abnormal EKG. Pt also reports chest and back pain and appears to have some shortness of breath. He had 324 ASA, 1 nitro and 25mg  of metoprolol PTA. 20g right hand.   109/64 HR 82 98.4 95%

## 2019-03-23 ENCOUNTER — Other Ambulatory Visit: Payer: Self-pay

## 2019-04-07 ENCOUNTER — Other Ambulatory Visit: Payer: Self-pay | Admitting: Family Medicine

## 2019-04-09 ENCOUNTER — Other Ambulatory Visit: Payer: Self-pay | Admitting: Family Medicine

## 2019-04-09 DIAGNOSIS — M4802 Spinal stenosis, cervical region: Secondary | ICD-10-CM

## 2019-05-09 ENCOUNTER — Ambulatory Visit
Admission: RE | Admit: 2019-05-09 | Discharge: 2019-05-09 | Disposition: A | Discharge status: Home / Self Care | Payer: Medicare Other | Source: Ambulatory Visit | Attending: Family Medicine | Admitting: Family Medicine

## 2019-05-09 ENCOUNTER — Other Ambulatory Visit: Payer: Self-pay

## 2019-05-09 DIAGNOSIS — M4802 Spinal stenosis, cervical region: Secondary | ICD-10-CM

## 2019-05-20 ENCOUNTER — Inpatient Hospital Stay: Admission: RE | Admit: 2019-05-20 | Payer: Medicare Other | Source: Ambulatory Visit

## 2019-08-04 DIAGNOSIS — Z981 Arthrodesis status: Secondary | ICD-10-CM | POA: Insufficient documentation

## 2019-09-02 IMAGING — CT CT L SPINE W/ CM
1 of 7 series · 6 of 14 positions shown, 8 images · non-contrast
Comparison: MRI lumbar spine dated June 17, 2010.

CLINICAL DATA: Chronic low back pain radiating into both legs.
Denies numbness and tingling. No prior lumbar spine surgery. Patient
does have a history of prior cervical fusion, and feels as though he
has limited range of motion in his neck, with shooting pains
extending from his neck throughout his whole back into both legs.
TECHNIQUE: Contiguous axial images were obtained through the Lumbar spine after
the intrathecal infusion of contrast. Coronal and sagittal
reconstructions were obtained of the axial image sets.

[Series 3: l spine soft · axial · 0.32mm/px · z∈[-288,-94]mm · 6 of 92 slices shown, 8 images]
[im 14/92  soft-tissue]
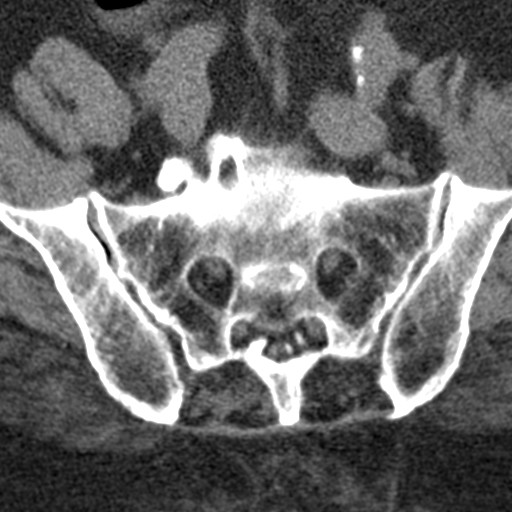
[im 14/92  bone]
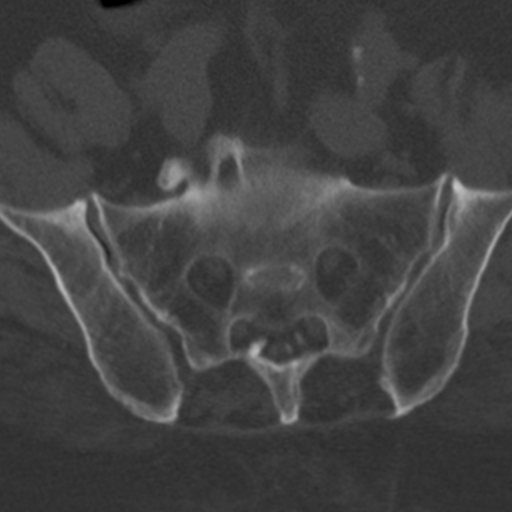
[im 27/92  bone]
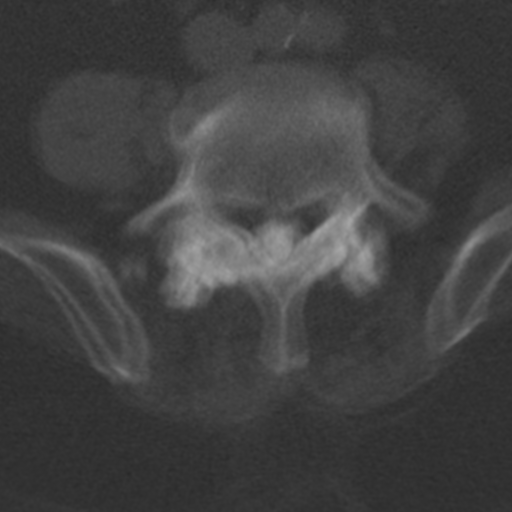
[im 40/92  bone]
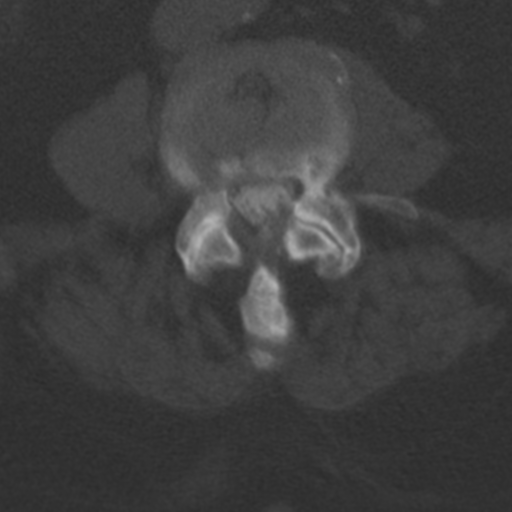
[im 53/92  bone]
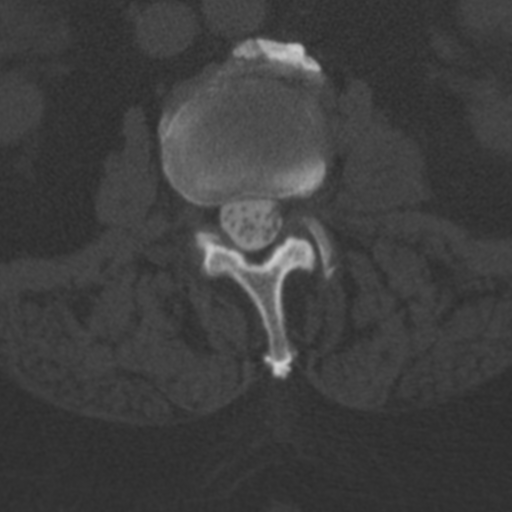
[im 66/92  soft-tissue]
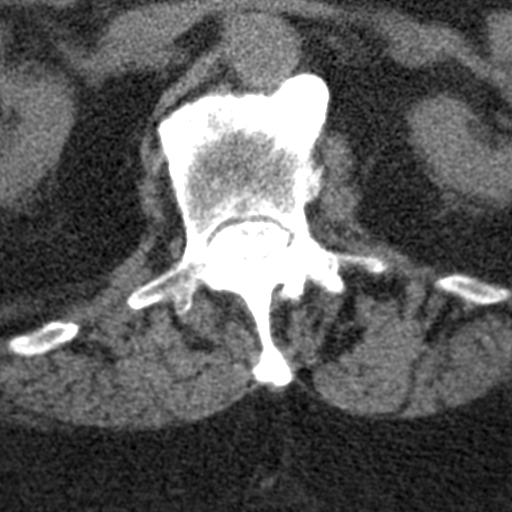
[im 66/92  bone]
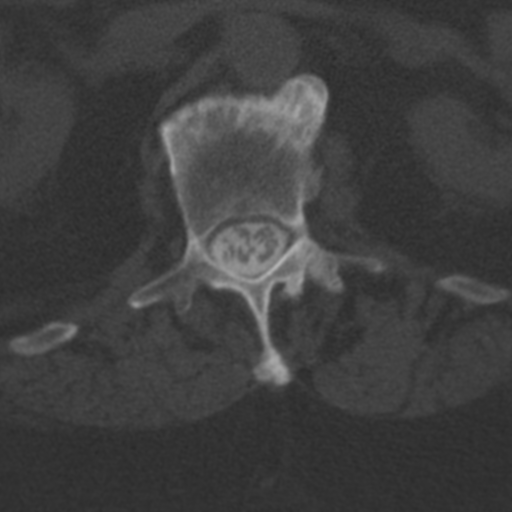
[im 79/92  bone]
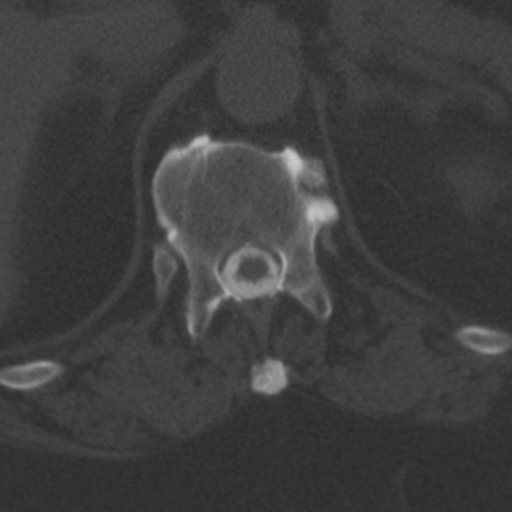

[6 of 14 positions shown; findings below may reference images not displayed]

EXAM:
LUMBAR MYELOGRAM

CT LUMBAR MYELOGRAM

FLUOROSCOPY TIME:  Radiation Exposure Index (as provided by the
fluoroscopic device): 290.66 ?Gy*m2

Fluoroscopy Time:  15 seconds.

Number of Acquired Images:  16

PROCEDURE:
After thorough discussion of risks and benefits of the procedure
including bleeding, infection, injury to nerves, blood vessels,
adjacent structures as well as headache and CSF leak, written and
oral informed consent was obtained. Consent was obtained by Dr.
Xian Mondragon. Time out form was completed.

Patient was positioned prone on the fluoroscopy table. Local
anesthesia was provided with 1% lidocaine without epinephrine after
prepped and draped in the usual sterile fashion. Puncture was
performed at L2-L3 using a 3 1/2 inch 22-gauge spinal needle via
right interlaminar approach. Using a single pass through the dura,
the needle was placed within the thecal sac, with return of clear
CSF. 15 mL of Isovue U-RQQ was injected into the thecal sac, with
normal opacification of the nerve roots and cauda equina consistent
with free flow within the subarachnoid space.

I personally performed the lumbar puncture and administered the
intrathecal contrast. I also personally supervised acquisition of
the myelogram images.
FINDINGS: LUMBAR MYELOGRAM FINDINGS:

Five lumbar type vertebral bodies. No acute fracture or subluxation.
Vertebral body heights are preserved. Trace anterolisthesis at
L4-L5. No pathologic motion. Mild multilevel disc height loss from
T12-L1 through L5-S1, sparing L2-L3. Small ventral extradural
defects from L1-L2 through L4-L5.

CT LUMBAR MYELOGRAM FINDINGS:

Segmentation: Standard.

Alignment: Trace anterolisthesis at L4-L5.

Vertebrae: No fracture, evidence of discitis, or focal bone lesion.

Conus medullaris and cauda equina: Conus extends to the L1-L2 level.
Conus and cauda equina appear normal.

Paraspinal and other soft tissues: Mild aortic atherosclerosis. Mild
bilateral sacroiliac osteoarthritis.

Disc levels:

T10-T11: Only seen at the very edge of the field of view moderate
central spinal canal stenosis due to posterior disc osteophyte
complex and ligamentum flavum hypertrophy.

T11-T12: Mild right facet arthropathy.  No stenosis.

T12-L1:  Mild bilateral facet arthropathy.  No stenosis.

L1-L2: Mild endplate spurring without significant disc bulge.
Moderate left and mild right facet arthropathy. Mild left
neuroforaminal stenosis. No spinal canal or right neuroforaminal
stenosis.

L2-L3: Trace disc bulge. Moderate bilateral facet arthropathy. No
stenosis.

L3-L4: Mild endplate spurring without significant disc bulge.
Moderate to severe bilateral facet arthropathy. Mild left and
moderate right neuroforaminal stenosis. No spinal canal stenosis.

L4-L5: Slightly limited evaluation due to motion artifact. Very
small diffuse disc bulge, slightly asymmetric to the right. Severe
bilateral facet arthropathy. Minimal right lateral recess narrowing.
Mild bilateral neuroforaminal stenosis.

L5-S1: Slightly limited evaluation due to motion artifact. Tiny disc
osteophyte complex and severe bilateral facet arthropathy. No
stenosis.
IMPRESSION: 1. Mild multilevel degenerative disc disease throughout the lumbar
spine. Advanced lumbar facet arthropathy, severe from L3-L4 through
L5-S1.
2. No significant spinal canal stenosis at any lumbar level.
Moderate neuroforaminal stenosis on the right at L3-L4. Mild
neuroforaminal stenosis on the left at L1-L2 and L3-L4, and
bilaterally at L4-L5.
3. Partially visualized moderate central spinal canal stenosis at
T10-T11.
4. Trace anterolisthesis at L4-L5 without pathologic motion.

## 2019-09-22 DIAGNOSIS — I639 Cerebral infarction, unspecified: Secondary | ICD-10-CM | POA: Insufficient documentation

## 2019-10-06 ENCOUNTER — Other Ambulatory Visit: Payer: Self-pay

## 2019-10-06 ENCOUNTER — Observation Stay (HOSPITAL_COMMUNITY): Payer: Medicare Other

## 2019-10-06 ENCOUNTER — Emergency Department (HOSPITAL_COMMUNITY): Payer: Medicare Other

## 2019-10-06 ENCOUNTER — Inpatient Hospital Stay (HOSPITAL_COMMUNITY)
Admission: EM | Admit: 2019-10-06 | Discharge: 2019-10-08 | DRG: 308 | Disposition: A | Discharge status: Home Health | Payer: Medicare Other | Attending: Internal Medicine | Admitting: Internal Medicine

## 2019-10-06 ENCOUNTER — Encounter (HOSPITAL_COMMUNITY): Payer: Self-pay

## 2019-10-06 DIAGNOSIS — Q211 Atrial septal defect: Secondary | ICD-10-CM

## 2019-10-06 DIAGNOSIS — M4712 Other spondylosis with myelopathy, cervical region: Secondary | ICD-10-CM | POA: Diagnosis present

## 2019-10-06 DIAGNOSIS — Z888 Allergy status to other drugs, medicaments and biological substances status: Secondary | ICD-10-CM

## 2019-10-06 DIAGNOSIS — Z8673 Personal history of transient ischemic attack (TIA), and cerebral infarction without residual deficits: Secondary | ICD-10-CM

## 2019-10-06 DIAGNOSIS — R109 Unspecified abdominal pain: Secondary | ICD-10-CM

## 2019-10-06 DIAGNOSIS — M4800 Spinal stenosis, site unspecified: Secondary | ICD-10-CM | POA: Diagnosis present

## 2019-10-06 DIAGNOSIS — Z9114 Patient's other noncompliance with medication regimen: Secondary | ICD-10-CM

## 2019-10-06 DIAGNOSIS — N179 Acute kidney failure, unspecified: Secondary | ICD-10-CM | POA: Diagnosis not present

## 2019-10-06 DIAGNOSIS — I2510 Atherosclerotic heart disease of native coronary artery without angina pectoris: Secondary | ICD-10-CM | POA: Diagnosis present

## 2019-10-06 DIAGNOSIS — I10 Essential (primary) hypertension: Secondary | ICD-10-CM | POA: Diagnosis present

## 2019-10-06 DIAGNOSIS — M19019 Primary osteoarthritis, unspecified shoulder: Secondary | ICD-10-CM | POA: Diagnosis present

## 2019-10-06 DIAGNOSIS — I4821 Permanent atrial fibrillation: Secondary | ICD-10-CM | POA: Diagnosis not present

## 2019-10-06 DIAGNOSIS — G894 Chronic pain syndrome: Secondary | ICD-10-CM | POA: Diagnosis present

## 2019-10-06 DIAGNOSIS — I4891 Unspecified atrial fibrillation: Secondary | ICD-10-CM | POA: Diagnosis not present

## 2019-10-06 DIAGNOSIS — D638 Anemia in other chronic diseases classified elsewhere: Secondary | ICD-10-CM | POA: Diagnosis present

## 2019-10-06 DIAGNOSIS — Z79899 Other long term (current) drug therapy: Secondary | ICD-10-CM

## 2019-10-06 DIAGNOSIS — R413 Other amnesia: Secondary | ICD-10-CM | POA: Diagnosis present

## 2019-10-06 DIAGNOSIS — E785 Hyperlipidemia, unspecified: Secondary | ICD-10-CM | POA: Diagnosis present

## 2019-10-06 DIAGNOSIS — M1710 Unilateral primary osteoarthritis, unspecified knee: Secondary | ICD-10-CM | POA: Diagnosis present

## 2019-10-06 DIAGNOSIS — L89153 Pressure ulcer of sacral region, stage 3: Secondary | ICD-10-CM | POA: Diagnosis present

## 2019-10-06 DIAGNOSIS — Z96653 Presence of artificial knee joint, bilateral: Secondary | ICD-10-CM | POA: Diagnosis present

## 2019-10-06 DIAGNOSIS — H409 Unspecified glaucoma: Secondary | ICD-10-CM | POA: Diagnosis present

## 2019-10-06 DIAGNOSIS — Z20828 Contact with and (suspected) exposure to other viral communicable diseases: Secondary | ICD-10-CM | POA: Diagnosis present

## 2019-10-06 DIAGNOSIS — L8990 Pressure ulcer of unspecified site, unspecified stage: Secondary | ICD-10-CM | POA: Insufficient documentation

## 2019-10-06 DIAGNOSIS — Z7901 Long term (current) use of anticoagulants: Secondary | ICD-10-CM

## 2019-10-06 DIAGNOSIS — Z952 Presence of prosthetic heart valve: Secondary | ICD-10-CM

## 2019-10-06 DIAGNOSIS — Z79891 Long term (current) use of opiate analgesic: Secondary | ICD-10-CM

## 2019-10-06 DIAGNOSIS — Z7982 Long term (current) use of aspirin: Secondary | ICD-10-CM

## 2019-10-06 DIAGNOSIS — R1033 Periumbilical pain: Secondary | ICD-10-CM | POA: Diagnosis present

## 2019-10-06 DIAGNOSIS — Z87891 Personal history of nicotine dependence: Secondary | ICD-10-CM

## 2019-10-06 DIAGNOSIS — F329 Major depressive disorder, single episode, unspecified: Secondary | ICD-10-CM | POA: Diagnosis present

## 2019-10-06 DIAGNOSIS — I959 Hypotension, unspecified: Secondary | ICD-10-CM | POA: Diagnosis not present

## 2019-10-06 LAB — CBC
HCT: 38.1 % — ABNORMAL LOW (ref 39.0–52.0)
Hemoglobin: 11.8 g/dL — ABNORMAL LOW (ref 13.0–17.0)
MCH: 26.5 pg (ref 26.0–34.0)
MCHC: 31 g/dL (ref 30.0–36.0)
MCV: 85.4 fL (ref 80.0–100.0)
Platelets: 358 10*3/uL (ref 150–400)
RBC: 4.46 MIL/uL (ref 4.22–5.81)
RDW: 18.6 % — ABNORMAL HIGH (ref 11.5–15.5)
WBC: 8.2 10*3/uL (ref 4.0–10.5)
nRBC: 0 % (ref 0.0–0.2)

## 2019-10-06 LAB — COMPREHENSIVE METABOLIC PANEL
ALT: 15 U/L (ref 0–44)
AST: 20 U/L (ref 15–41)
Albumin: 3.6 g/dL (ref 3.5–5.0)
Alkaline Phosphatase: 96 U/L (ref 38–126)
Anion gap: 14 (ref 5–15)
BUN: 14 mg/dL (ref 8–23)
CO2: 23 mmol/L (ref 22–32)
Calcium: 9.6 mg/dL (ref 8.9–10.3)
Chloride: 100 mmol/L (ref 98–111)
Creatinine, Ser: 1.25 mg/dL — ABNORMAL HIGH (ref 0.61–1.24)
GFR calc Af Amer: >60 mL/min (ref >60)
GFR calc non Af Amer: 55 mL/min — ABNORMAL LOW (ref >60)
Glucose, Bld: 86 mg/dL (ref 70–99)
Potassium: 3.9 mmol/L (ref 3.5–5.1)
Sodium: 137 mmol/L (ref 135–145)
Total Bilirubin: 0.6 mg/dL (ref 0.3–1.2)
Total Protein: 8.2 g/dL — ABNORMAL HIGH (ref 6.5–8.1)

## 2019-10-06 LAB — PROTIME-INR
INR: 1.3 — ABNORMAL HIGH (ref 0.8–1.2)
Prothrombin Time: 15.9 seconds — ABNORMAL HIGH (ref 11.4–15.2)

## 2019-10-06 LAB — TSH: TSH: 3.303 u[IU]/mL (ref 0.350–4.500)

## 2019-10-06 LAB — MAGNESIUM: Magnesium: 1.9 mg/dL (ref 1.7–2.4)

## 2019-10-06 MED ORDER — APIXABAN 5 MG PO TABS
5.0000 mg | ORAL_TABLET | Freq: Two times a day (BID) | ORAL | Status: DC
  Administered 2019-10-06 – 2019-10-08 (×4): 5 mg via ORAL
  Filled 2019-10-06 (×5): qty 1

## 2019-10-06 MED ORDER — DILTIAZEM LOAD VIA INFUSION
10.0000 mg | Freq: Once | INTRAVENOUS | Status: AC
  Administered 2019-10-06: 10 mg via INTRAVENOUS
  Filled 2019-10-06: qty 10

## 2019-10-06 MED ORDER — ACETAMINOPHEN 325 MG PO TABS
650.0000 mg | ORAL_TABLET | ORAL | Status: DC | PRN
  Administered 2019-10-07: 650 mg via ORAL
  Filled 2019-10-06: qty 2

## 2019-10-06 MED ORDER — ONDANSETRON HCL 4 MG/2ML IJ SOLN: 4.0000 mg | Freq: Four times a day (QID) | INTRAMUSCULAR | Status: DC | PRN

## 2019-10-06 MED ORDER — MAGNESIUM SULFATE 50 % IJ SOLN
1.0000 g | Freq: Once | INTRAMUSCULAR | Status: DC
  Filled 2019-10-06: qty 2

## 2019-10-06 MED ORDER — SODIUM CHLORIDE 0.9% FLUSH
3.0000 mL | Freq: Once | INTRAVENOUS | Status: AC
  Administered 2019-10-06: 19:00:00 3 mL via INTRAVENOUS

## 2019-10-06 MED ORDER — POTASSIUM CHLORIDE CRYS ER 20 MEQ PO TBCR
20.0000 meq | EXTENDED_RELEASE_TABLET | Freq: Once | ORAL | Status: AC
  Administered 2019-10-06: 20 meq via ORAL
  Filled 2019-10-06: qty 1

## 2019-10-06 MED ORDER — DILTIAZEM HCL-DEXTROSE 125-5 MG/125ML-% IV SOLN (PREMIX)
5.0000 mg/h | INTRAVENOUS | Status: DC
  Administered 2019-10-06: 5 mg/h via INTRAVENOUS
  Filled 2019-10-06 (×2): qty 125

## 2019-10-06 NOTE — ED Triage Notes (Signed)
Pt bib ems from pain clinic for tachycardia, a fib rvr (150s). Denies chest pain, some SOB and dizziness with ambulation  Recent back surgery.  Bp 145/80

## 2019-10-06 NOTE — ED Provider Notes (Signed)
[facility] EMERGENCY DEPARTMENT Provider Note   CSN: [id number] Arrival date & time: 10/06/19  1818     History   Chief Complaint Chief Complaint  Patient presents with  . Shortness of Breath  . Atrial Fibrillation    HPI [name] is a 78 y.o. male.    Patient presents from clinic with concern of A. fib with rapid ventricular response. Patient self is a very poor historian, cannot specify when his symptoms began, but notes that possibly for a day or 2 he has had some chest pressure. He initially denies dyspnea, though this is unclear on secondary questioning. Level 5 caveat secondary to memory loss. Seemingly the patient was discharged about 3 weeks ago after lumbar spine surgery at [facility].  1 he has been in a rehabilitation facility since that point.  On today he went for a planned outpatient follow-up, and there and was found to have atrial fibrillation, rapid ventricular response per Patient denies history of this, though his chart suggests otherwise. When asked about the patient's medication compliance, specifically with his anticoagulant he states that he does not think he has had that medicine in some time.  Shortness of Breath Atrial Fibrillation Associated symptoms include shortness of breath.    Past Medical History:  Diagnosis Date  . Cancer ([place])   . Carpal tunnel syndrome   . Cervical spondylosis with myelopathy   . Chronic pain syndrome   . Depression   . Difficult intubation    needs smaller tube; awake oral fiberoptic scope 8.0 ETT 03/30/08  . Dysphagia, pharyngeal phase   . Hemorrhage of gastrointestinal tract, unspecified   . History of cardiac cath    a. 03/2018 Cath: LAD and LCX with mild luminal irregularities.  No significant disease.  . Iron deficiency anemia, unspecified   . Lumbosacral spondylosis without myelopathy   . Muscle weakness (generalized)   . Other and unspecified disc disorder of cervical region   .  Other and unspecified hyperlipidemia   . Patent foramen ovale    a. 05/2018 Echo: post-op TAVR--> + bublble study w/ L->R shunt.  . Permanent atrial fibrillation ([place])    a.  Diagnosed in the spring 2019.  Had rapid atrial fibrillation following TAVR and has been rate controlled with beta-blocker.  CHA2DS2VASc equals 7.  Supposed to be on Eliquis.  . Primary localized osteoarthrosis, lower leg   . Primary localized osteoarthrosis, shoulder region   . Reflux esophagitis   . Severe aortic stenosis    a. mild-mod AS by 01/2016 TTE, restricted AV opening with mod AR 01/2016 TEE; b. 10/2017 Echo: EF 60-65%, sev Ca2+ AoV, mean grad 12mmHg; c. 05/2018 s/p TAVR ([facility]); d. 06/2018 Echo: EF 55-60%, well-seated prosth AoV, peak velocity 156cm/s. AoV gradient 4mmHg.  Marland Kitchen Spinal stenosis, unspecified region other than cervical   . Stroke ([place])    01/2016  . Syncope and collapse   . Unspecified arthropathy, lower leg   . Unspecified constipation   . Unspecified essential hypertension   . Unspecified glaucoma(365.9)     Patient Active Problem List   Diagnosis Date Noted  . Atrial fibrillation with rapid ventricular response ([place]) 10/06/2019  . Acute encephalopathy   . GI bleeding 10/13/2018  . Acute metabolic encephalopathy [id number]  . Acute renal failure (ARF) ([place]) 10/13/2018  . S/P TAVR (transcatheter aortic valve replacement) 09/20/2018  . Permanent atrial fibrillation ([place]) 09/20/2018  . Acute on chronic diastolic CHF (congestive heart failure) ([place]) 09/18/2018  .  Heme positive stool 11/20/2017  . Gait disorder 03/24/2013  . Cervical post-laminectomy syndrome 03/26/2012  . Depression 03/26/2012  . Degenerative arthritis of shoulder region 03/26/2012  . KNEE REPLACEMENT, BILATERAL, HX OF 06/04/2007  . CELLULITIS/ABSCESS, ARM 05/05/2007  . ANXIETY 04/08/2007  . PAIN, CHRONIC NEC 04/08/2007  . OSTEOARTHROSIS, GENERALIZED, MULTIPLE SITES 04/08/2007  . SPONDYLOLISTHESIS 04/08/2007  . INCONTINENCE,  URGE 04/08/2007  . HYPERCHOLESTEROLEMIA 03/07/2007  . HYPERTENSION, BENIGN 03/07/2007    Past Surgical History:  Procedure Laterality Date  . BILATERAL CATARACT SURGERY  2009   DR GROAT   . BIOPSY  10/14/2018   Procedure: BIOPSY;  Surgeon: Wonda Horner, MD;  Location: [facility] ENDOSCOPY;  Service: Endoscopy;;  . CARDIAC VALVE REPLACEMENT  05/2018  . CERVIACAL SPINE (4X)     DR MARK ROY   . COLONOSCOPY  2007   DR HENSEL   . COLONOSCOPY WITH PROPOFOL N/A 11/20/2017   Procedure: COLONOSCOPY WITH PROPOFOL;  Surgeon: Wilford Corner, MD;  Location: [place];  Service: Endoscopy;  Laterality: N/A;  . ESOPHAGOGASTRODUODENOSCOPY (EGD) WITH PROPOFOL N/A 10/14/2018   Procedure: ESOPHAGOGASTRODUODENOSCOPY (EGD) WITH PROPOFOL;  Surgeon: Wonda Horner, MD;  Location: [facility] ENDOSCOPY;  Service: Endoscopy;  Laterality: N/A;  . JOINT REPLACEMENT     both knees  . LEFT KNEE REPLACEMENT     DR Maureen Ralphs  . LEFT TRANSVERSE CARPAL LIGAMENT  01/06/2008  . ROTATOR CUFF LEFT SHOULDER  2001   DR MURPHY   . SHOULDER OPEN ROTATOR CUFF REPAIR  2006   DR [facility]  . SPINE SURGERY     neck fusion        Home Medications    Prior to Admission medications   Medication Sig Start Date End Date Taking? Authorizing Provider  apixaban (ELIQUIS) 5 MG TABS tablet Take 1 tablet (5 mg total) by mouth 2 (two) times daily. 10/20/18   Thurnell Lose, MD  aspirin EC 81 MG tablet Take 81 mg by mouth daily.    [provider]  buPROPion (WELLBUTRIN XL) 150 MG 24 hr tablet Take 150 mg by mouth daily. 09/11/18   [provider]  COLLAGEN PO Take 6 capsules by mouth daily.    [provider]  cyanocobalamin (,VITAMIN B-12,) 1000 MCG/ML injection Inject 1,000 mcg into the muscle every 30 (thirty) days. 07/24/18   [provider]  Dextran 70-Hypromellose (ARTIFICIAL TEARS PF OP) Place 1-2 drops into both eyes 3 (three) times daily as needed (for dryness).    [provider]   fluticasone (FLONASE) 50 MCG/ACT nasal spray Place 1 spray into both nostrils daily as needed for allergies or rhinitis.  07/30/18   [provider]  furosemide (LASIX) 20 MG tablet Take 2 tablets (40mg ) in AM and 1 tablet (20mg  in PM) Patient taking differently: Take 20-40 mg by mouth See admin instructions. Take 40 mg by mouth in the morning and 20 mg at bedtime 09/20/18   Ahmed Prima, [place] M, PA-C  gabapentin (NEURONTIN) 300 MG capsule Take 300 mg by mouth 3 (three) times daily. 08/10/18   [provider]  methocarbamol (ROBAXIN) 500 MG tablet Take 500 mg by mouth 2 (two) times daily. 08/19/18   [provider]  metoprolol succinate (TOPROL-XL) 25 MG 24 hr tablet Take 1 tablet (25 mg total) by mouth 2 (two) times daily. 09/20/18   Strader, Fransisco Hertz, PA-C  Misc Natural Products (BETA-SITOSTEROL PLANT STEROLS) CAPS Take 1 capsule by mouth daily.    [provider]  omeprazole (  PRILOSEC) 20 MG capsule Take 1 capsule (20 mg total) by mouth daily for 7 days. 01/29/19 02/05/19  Duffy Bruce, MD  OVER THE COUNTER MEDICATION See admin instructions. Nitro Beets otc supplement - 1 rounded teaspoonful once daily    [provider]  oxyCODONE-acetaminophen (PERCOCET) 10-325 MG tablet Take 1 tablet by mouth 4 (four) times daily as needed for pain. Patient taking differently: Take 1 tablet by mouth 5 (five) times daily.  10/16/18   Thurnell Lose, MD  pantoprazole (PROTONIX) 40 MG tablet Take 1 tablet (40 mg total) by mouth 2 (two) times daily. Patient not taking: Reported on 01/29/2019 10/16/18   Thurnell Lose, MD  polyethylene glycol [facility] / GLYCOLAX) packet Take 17 g by mouth daily.    [provider]  potassium chloride (K-DUR,KLOR-CON) 20 MEQ tablet Take 1 tablet (20 mEq total) by mouth daily. Patient taking differently: Take 20 mEq by mouth at bedtime.  09/20/18   Strader, Fransisco Hertz, PA-C  QUEtiapine (SEROQUEL) 25 MG tablet Take 25 mg by mouth  at bedtime. 09/25/18   [provider]  rOPINIRole (REQUIP) 0.25 MG tablet Take 0.25 mg by mouth 2 (two) times daily.  10/09/18   [provider]  silver sulfADIAZINE (SILVADENE) 1 % cream Apply 1 application topically 2 (two) times daily.    [provider]  simvastatin (ZOCOR) 40 MG tablet Take 40 mg by mouth at bedtime.    [provider]  topiramate (TOPAMAX) 25 MG tablet Take 25 mg by mouth 2 (two) times daily. 09/21/18   [provider]  TURMERIC PO Take 1-2 capsules by mouth daily.     [provider]    Family History Family History  Problem Relation Age of Onset  . Cancer Mother 30       cervical cancer  . Cancer Father 23       lung  cancer    Social History Social History   Tobacco Use  . Smoking status: Former Smoker    Quit date: 08/29/1985    Years since quitting: 34.1  . Smokeless tobacco: Former Systems developer    Quit date: 02/12/1985  Substance Use Topics  . Alcohol use: No  . Drug use: No     Allergies   Lorazepam, Duloxetine, Pregabalin, and Zanaflex [tizanidine hcl]   Review of Systems Review of Systems  Unable to perform ROS: Other  Respiratory: Positive for shortness of breath.   cognitive loss, cannot tell many aspects of his Hx   Physical Exam Updated Vital Signs BP 130/74   Pulse 96   Temp 98.3 F (36.8 C) (Oral)   Resp 11   SpO2 100%   Physical Exam Vitals signs and nursing note reviewed.  Constitutional:      General: He is not in acute distress. HENT:     Head: Normocephalic and atraumatic.  Eyes:     Conjunctiva/sclera: Conjunctivae normal.  Cardiovascular:     Rate and Rhythm: Tachycardia present. Rhythm irregular.  Pulmonary:     Effort: Pulmonary effort is normal. No respiratory distress.     Breath sounds: No stridor.  Abdominal:     General: There is no distension.  Musculoskeletal:     Comments: No deformities  Skin:    General: Skin is warm and dry.  Neurological:      General: No focal deficit present.     Mental Status: He is alert.     Motor: Atrophy present.  Psychiatric:  Behavior: Behavior is slowed and withdrawn.        Cognition and Memory: Cognition is impaired.      ED Treatments / Results  Labs (all labs ordered are listed, but only abnormal results are displayed) Labs Reviewed  CBC - Abnormal; Notable for the following components:      Result Value   Hemoglobin 11.8 (*)    HCT 38.1 (*)    RDW 18.6 (*)    All other components within normal limits  PROTIME-INR - Abnormal; Notable for the following components:   Prothrombin Time 15.9 (*)    INR 1.3 (*)    All other components within normal limits  COMPREHENSIVE METABOLIC PANEL - Abnormal; Notable for the following components:   Creatinine, Ser 1.25 (*)    Total Protein 8.2 (*)    GFR calc non Af Amer 55 (*)    All other components within normal limits  SARS CORONAVIRUS 2 (TAT 6-24 HRS)  MAGNESIUM  TSH    EKG EKG Interpretation  Date/Time:  Tuesday October 06 2019 18:22:27 EDT Ventricular Rate:  144 PR Interval:    QRS Duration: 80 QT Interval:  268 QTC Calculation: 414 R Axis:   19 Text Interpretation:  Atrial fibrillation with rapid ventricular response Septal infarct , age undetermined Abnormal ECG Confirmed by Carmin Muskrat [phone]) on 10/06/2019 6:32:39 PM   Radiology Dg Chest Port 1 View  Result Date: 10/06/2019 CLINICAL DATA:  Chest pain, tachycardia, atrial fibrillation with rapid ventricular response EXAM: PORTABLE CHEST 1 VIEW COMPARISON:  CT 01/31/2011 FINDINGS: Coarse basilar interstitial changes are similar to prior. Basilar areas of atelectasis. No consolidation, features of edema, pneumothorax, or effusion. Mild cardiomegaly, stable to comparison exams. Atherosclerotic calcification of the aortic arch. Cervical and thoracic fusion hardware is present. Postsurgical changes from right rotator cuff repair. Severe arthrosis noted in both shoulders.  Multilevel degenerative changes present in the spine. IMPRESSION: Unchanged coarse basilar interstitial changes. Streaky areas of basilar atelectasis. No other acute cardiopulmonary abnormality. Electronically Signed   By: Lovena Le M.D.   On: 10/06/2019 19:17    Procedures Procedures (including critical care time)   CRITICAL CARE Performed by: Carmin Muskrat Total critical care time: 35 minutes Critical care time was exclusive of separately billable procedures and treating other patients. Critical care was necessary to treat or prevent imminent or life-threatening deterioration. Critical care was time spent personally by me on the following activities: development of treatment plan with patient and/or surrogate as well as nursing, discussions with consultants, evaluation of patient's response to treatment, examination of patient, obtaining history from patient or surrogate, ordering and performing treatments and interventions, ordering and review of laboratory studies, ordering and review of radiographic studies, pulse oximetry and re-evaluation of patient's condition.     Medications Ordered in ED Medications  diltiazem (CARDIZEM) 1 mg/mL load via infusion 10 mg (10 mg Intravenous Bolus from Bag 10/06/19 1901)    And  diltiazem (CARDIZEM) 125 mg in dextrose 5% 125 mL (1 mg/mL) infusion (7.5 mg/hr Intravenous Rate/Dose Verify 10/06/19 1932)  sodium chloride flush (NS) 0.9 % injection 3 mL (3 mLs Intravenous Given 10/06/19 1907)   CHA2DS2/VAS Stroke Risk Points  Current as of 2 weeks ago     6 >= 2 Points: High Risk  1 - 1.99 Points: Medium Risk  0 Points: Low Risk    This is the only CHA2DS2/VAS Stroke Risk Points available for the past  year.: Last Change: N/A     Details  This score determines the patient's risk of having a stroke if the  patient has atrial fibrillation.       Points Metrics  1 Has Congestive Heart Failure:  Yes    Current as of 2 weeks ago  0 Has  Vascular Disease:  No    Current as of 2 weeks ago  1 Has Hypertension:  Yes    Current as of 2 weeks ago  2 Age:  80    Current as of 2 weeks ago  0 Has Diabetes:  No    Current as of 2 weeks ago  2 Had Stroke:  Yes   Had TIA:  No  Had thromboembolism:  No    Current as of 2 weeks ago  0 Male:  No    Current as of 2 weeks ago            Initial Impression / Assessment and Plan / ED Course  I have reviewed the triage vital signs and the nursing notes.  Pertinent labs & imaging results that were available during my care of the patient were reviewed by me and considered in my medical decision making (see chart for details).    With consideration of A. fib, RVR, but questionable time of onset, questionable use of anticoagulant, the patient will start Cardizem drip, rather than have electrical cardioversion.     8:52 PM Heart rate now 105. Patient in no distress, sitting up.  And continues to not be able to recall details of his history, including onset, and whether he is taking his medication as directed. I have discussed the patient's case with our cardiology colleagues for consultation.  This elderly male with a documented history of A. fib, and anticoagulant list presents with palpitations, pressure, now 3 weeks after hospitalization.  On given the patient's inability to describe onset of symptoms, or whether if he is taking his medication, including anticoagulants made him not a candidate for electrical cardioversion.  1 patient required initiation of Cardizem, bolus, drip in the emergency department, which had the effect of lowering his heart rate to the 100 range. With concern for ongoing necessity of Cardizem, consideration of his medications, patient will require admission for further monitoring, management.  Line cardiology, and hospitalist colleagues both aware of the patient's presentation.  Final Clinical Impressions(s) / ED Diagnoses   Final diagnoses:  Atrial  fibrillation with rapid ventricular response [facility])    ED Discharge Orders         Ordered    Amb referral to AFIB Clinic     10/06/19 Valerie Roys           Carmin Muskrat, MD 10/06/19 2055

## 2019-10-06 NOTE — ED Notes (Signed)
ED TO INPATIENT HANDOFF REPORT  ED Nurse Name and Phone #: [name], [place] [phone]  S Name/Age/Gender [name] Varady 78 y.o. male Room/Bed: 047C/047C  Code Status   Code Status: Prior  Home/SNF/Other Home Patient oriented to: self, place and time Is this baseline? NO.  Triage Complete: Triage complete  Chief Complaint SHOB, AFIB  Triage Note Pt bib ems from pain clinic for tachycardia, a fib rvr (150s). Denies chest pain, some SOB and dizziness with ambulation  Recent back surgery.  Bp 145/80   Allergies Allergies  Allergen Reactions  . Lorazepam Other (See Comments)    Delirium   . Duloxetine Other (See Comments)    Increased depression, irritability  . Pregabalin Swelling and Other (See Comments)    Flashbacks, Bad dreams, Aggressive behavior, swelling  . Zanaflex [Tizanidine Hcl] Anxiety    Level of Care/Admitting Diagnosis ED Disposition    None      B Medical/Surgery History Past Medical History:  Diagnosis Date  . Cancer ([place])   . Carpal tunnel syndrome   . Cervical spondylosis with myelopathy   . Chronic pain syndrome   . Depression   . Difficult intubation    needs smaller tube; awake oral fiberoptic scope 8.0 ETT 03/30/08  . Dysphagia, pharyngeal phase   . Hemorrhage of gastrointestinal tract, unspecified   . History of cardiac cath    a. 03/2018 Cath: LAD and LCX with mild luminal irregularities.  No significant disease.  . Iron deficiency anemia, unspecified   . Lumbosacral spondylosis without myelopathy   . Muscle weakness (generalized)   . Other and unspecified disc disorder of cervical region   . Other and unspecified hyperlipidemia   . Patent foramen ovale    a. 05/2018 Echo: post-op TAVR--> + bublble study w/ L->R shunt.  . Permanent atrial fibrillation ([place])    a.  Diagnosed in the spring 2019.  Had rapid atrial fibrillation following TAVR and has been rate controlled with beta-blocker.  CHA2DS2VASc equals 7.  Supposed to be on Eliquis.   . Primary localized osteoarthrosis, lower leg   . Primary localized osteoarthrosis, shoulder region   . Reflux esophagitis   . Severe aortic stenosis    a. mild-mod AS by 01/2016 TTE, restricted AV opening with mod AR 01/2016 TEE; b. 10/2017 Echo: EF 60-65%, sev Ca2+ AoV, mean grad 74mmHg; c. 05/2018 s/p TAVR ([facility]); d. 06/2018 Echo: EF 55-60%, well-seated prosth AoV, peak velocity 156cm/s. AoV gradient 68mmHg.  Marland Kitchen Spinal stenosis, unspecified region other than cervical   . Stroke ([place])    01/2016  . Syncope and collapse   . Unspecified arthropathy, lower leg   . Unspecified constipation   . Unspecified essential hypertension   . Unspecified glaucoma(365.9)    Past Surgical History:  Procedure Laterality Date  . BILATERAL CATARACT SURGERY  2009   DR GROAT   . BIOPSY  10/14/2018   Procedure: BIOPSY;  Surgeon: Wonda Horner, MD;  Location: [facility] ENDOSCOPY;  Service: Endoscopy;;  . CARDIAC VALVE REPLACEMENT  05/2018  . CERVIACAL SPINE (4X)     DR MARK ROY   . COLONOSCOPY  2007   DR HENSEL   . COLONOSCOPY WITH PROPOFOL N/A 11/20/2017   Procedure: COLONOSCOPY WITH PROPOFOL;  Surgeon: Wilford Corner, MD;  Location: [place];  Service: Endoscopy;  Laterality: N/A;  . ESOPHAGOGASTRODUODENOSCOPY (EGD) WITH PROPOFOL N/A 10/14/2018   Procedure: ESOPHAGOGASTRODUODENOSCOPY (EGD) WITH PROPOFOL;  Surgeon: Wonda Horner, MD;  Location: [facility] ENDOSCOPY;  Service: Endoscopy;  Laterality: N/A;  . JOINT REPLACEMENT     both knees  . LEFT KNEE REPLACEMENT     DR Maureen Ralphs  . LEFT TRANSVERSE CARPAL LIGAMENT  01/06/2008  . ROTATOR CUFF LEFT SHOULDER  2001   DR MURPHY   . SHOULDER OPEN ROTATOR CUFF REPAIR  2006   DR [facility]  . SPINE SURGERY     neck fusion     A IV Location/Drains/Wounds Patient Lines/Drains/Airways Status   Active Line/Drains/Airways    Name:   Placement date:   Placement time:   Site:   Days:   Peripheral IV 10/06/19 Right Antecubital   10/06/19    1829    Antecubital   less than  1   Peripheral IV 10/06/19 Left Antecubital   10/06/19    1917    Antecubital   less than 1          Intake/Output Last 24 hours  Intake/Output Summary (Last 24 hours) at 10/06/2019 2016 Last data filed at 10/06/2019 1932 Gross per 24 hour  Intake 2.06 ml  Output -  Net 2.06 ml    Labs/Imaging Results for orders placed or performed during the hospital encounter of 10/06/19 (from the past 48 hour(s))  CBC     Status: Abnormal   Collection Time: 10/06/19  6:31 PM  Result Value Ref Range   WBC 8.2 4.0 - 10.5 K/uL   RBC 4.46 4.22 - 5.81 MIL/uL   Hemoglobin 11.8 (L) 13.0 - 17.0 g/dL   HCT 38.1 (L) 39.0 - 52.0 %   MCV 85.4 80.0 - 100.0 fL   MCH 26.5 26.0 - 34.0 pg   MCHC 31.0 30.0 - 36.0 g/dL   RDW 18.6 (H) 11.5 - 15.5 %   Platelets 358 150 - 400 K/uL   nRBC 0.0 0.0 - 0.2 %    Comment: Performed at [facility], [address], [place], [place] [address]  Protime-INR- (order if Patient is taking Coumadin / Warfarin)     Status: Abnormal   Collection Time: 10/06/19  6:31 PM  Result Value Ref Range   Prothrombin Time 15.9 (H) 11.4 - 15.2 seconds   INR 1.3 (H) 0.8 - 1.2    Comment: (NOTE) INR goal varies based on device and disease states. Performed at [facility], [place] [address] [place], [place] [address]   Magnesium     Status: None   Collection Time: 10/06/19  6:47 PM  Result Value Ref Range   Magnesium 1.9 1.7 - 2.4 mg/dL    Comment: Performed at [facility], [place] [address], [place], [place] [address]  Comprehensive metabolic panel     Status: Abnormal   Collection Time: 10/06/19  6:47 PM  Result Value Ref Range   Sodium 137 135 - 145 mmol/L   Potassium 3.9 3.5 - 5.1 mmol/L   Chloride 100 98 - 111 mmol/L   CO2 23 22 - 32 mmol/L   Glucose, Bld 86 70 - 99 mg/dL   BUN 14 8 - 23 mg/dL   Creatinine, Ser 1.25 (H) 0.61 - 1.24 mg/dL   Calcium 9.6 8.9 - 10.3 mg/dL   Total Protein 8.2 (H) 6.5 - 8.1 g/dL   Albumin 3.6 3.5 - 5.0 g/dL   AST 20 15 - 41  U/L   ALT 15 0 - 44 U/L   Alkaline Phosphatase 96 38 - 126 U/L   Total Bilirubin 0.6 0.3 - 1.2 mg/dL   GFR calc non  Af Amer 55 (L) >60 mL/min   GFR calc Af Amer >60 >60 mL/min   Anion gap 14 5 - 15    Comment: Performed at [facility] [address] [place], [place] [address]   Dg Chest Port 1 View  Result Date: 10/06/2019 CLINICAL DATA:  Chest pain, tachycardia, atrial fibrillation with rapid ventricular response EXAM: PORTABLE CHEST 1 VIEW COMPARISON:  CT 01/31/2011 FINDINGS: Coarse basilar interstitial changes are similar to prior. Basilar areas of atelectasis. No consolidation, features of edema, pneumothorax, or effusion. Mild cardiomegaly, stable to comparison exams. Atherosclerotic calcification of the aortic arch. Cervical and thoracic fusion hardware is present. Postsurgical changes from right rotator cuff repair. Severe arthrosis noted in both shoulders. Multilevel degenerative changes present in the spine. IMPRESSION: Unchanged coarse basilar interstitial changes. Streaky areas of basilar atelectasis. No other acute cardiopulmonary abnormality. Electronically Signed   By: Lovena Le M.D.   On: 10/06/2019 19:17    Pending Labs Unresulted Labs (From admission, onward)    Start     Ordered   10/06/19 1850  SARS CORONAVIRUS 2 (TAT 6-24 HRS) Nasopharyngeal Nasopharyngeal Swab  (Asymptomatic/Tier 2 Patients Labs)  Once,   STAT    Question Answer Comment  Is this test for diagnosis or screening Screening   Symptomatic for COVID-19 as defined by CDC No   Hospitalized for COVID-19 No   Admitted to ICU for COVID-19 No   Previously tested for COVID-19 No   Resident in a congregate (group) care setting No   Employed in healthcare setting No      10/06/19 1849   10/06/19 1848  TSH  ONCE - STAT,   STAT     10/06/19 1848          Vitals/Pain Today's Vitals   10/06/19 1900 10/06/19 1919 10/06/19 1930 10/06/19 2000  BP: (!) 143/92 (!) 143/83 (!) 143/94 130/79  Pulse: (!)  114 (!) 109 (!) 115 (!) 101  Resp: 11  15 12   Temp:      TempSrc:      SpO2: 100% 100% 100% 99%  PainSc:  0-No pain      Isolation Precautions No active isolations  Medications Medications  diltiazem (CARDIZEM) 1 mg/mL load via infusion 10 mg (10 mg Intravenous Bolus from Bag 10/06/19 1901)    And  diltiazem (CARDIZEM) 125 mg in dextrose 5% 125 mL (1 mg/mL) infusion (7.5 mg/hr Intravenous Rate/Dose Verify 10/06/19 1932)  sodium chloride flush (NS) 0.9 % injection 3 mL (3 mLs Intravenous Given 10/06/19 1907)    Mobility walks Low fall risk   Focused Assessments Cardiac Assessment Handoff:  Cardiac Rhythm: Atrial fibrillation Lab Results  Component Value Date   CKTOTAL 88 04/25/2010   CKMB 1.8 04/25/2010   TROPONINI <0.03 01/29/2019   Lab Results  Component Value Date   DDIMER 0.71 (H) 09/18/2018   Does the Patient currently have chest pain? No     R Recommendations: See Admitting Provider Note  Report given to:   Additional Notes:

## 2019-10-06 NOTE — Consult Note (Signed)
[facility] Consult Note   Primary Physician:  Hayden Rasmussen, MD Primary Cardiologist:   Thayer Dallas / Elby Beck [phone])  Reason for Consultation:   Atrial fibrillation with RVR  HPI:    [name] is a 78 year old gentleman with a past medical history significant for severe AS (s/p TAVR in 05/2018 at [facility]), permanent atrial fibrillation, HTN, HLD, nonobstructive CAD, known PFO, degenerative disc disease with chronic neck and back pain status post multiple surgeries, and prior CVA who is admitted to the hospital with complaints of shortness of breath. He was brought into the hospital by EMS from the pain clinic for complaints of dizziness and some dyspnea on exertion.  He was noted to be in atrial fibrillation with RVR with a heart rate in the 150s.  He denies any chest pain. He recently underwent back surgery at [facility]  On reviewing the medical records, it seems the patient should be on Eliquis. But today in the ED the patient is not clear on whether he is on chronic anti-coagulation.  In the ED the blood pressure was 111/81 mmHg with a heart rate of 145 bpm with telemetry confirming atrial fibrillation.  The O2 saturation was 99%. The labs were as follows: Serum potassium 3.9, BUN 14, creatinine 1.25, magnesium 1.9, hemoglobin 11.8, hematocrit 38.1, and TSH 3.30.  The electrocardiogram showed atrial fibrillation with RVR.  There were no ST elevations or depressions.  There was poor R wave progression in the anterior precordial leads.  The chest x-ray was significant for streaky areas of basilar atelectasis.  There was no other cardiopulmonary abnormality.  Cardiac work-up 03/2018 Cath: LAD and LCX with mild luminal irregularities.  No significant disease. 06/2018 Echo: EF 55-60%, well-seated prosthetic AoV, peak velocity 156cm/s. AoV gradient 52mmHg    Home Medications Prior to Admission medications   Medication Sig Start Date End Date Taking? Authorizing  Provider  apixaban (ELIQUIS) 5 MG TABS tablet Take 1 tablet (5 mg total) by mouth 2 (two) times daily. 10/20/18   Thurnell Lose, MD  aspirin EC 81 MG tablet Take 81 mg by mouth daily.    [provider]  buPROPion (WELLBUTRIN XL) 150 MG 24 hr tablet Take 150 mg by mouth daily. 09/11/18   [provider]  COLLAGEN PO Take 6 capsules by mouth daily.    [provider]  cyanocobalamin (,VITAMIN B-12,) 1000 MCG/ML injection Inject 1,000 mcg into the muscle every 30 (thirty) days. 07/24/18   [provider]  Dextran 70-Hypromellose (ARTIFICIAL TEARS PF OP) Place 1-2 drops into both eyes 3 (three) times daily as needed (for dryness).    [provider]  fluticasone (FLONASE) 50 MCG/ACT nasal spray Place 1 spray into both nostrils daily as needed for allergies or rhinitis.  07/30/18   [provider]  furosemide (LASIX) 20 MG tablet Take 2 tablets (40mg ) in AM and 1 tablet (20mg  in PM) Patient taking differently: Take 20-40 mg by mouth See admin instructions. Take 40 mg by mouth in the morning and 20 mg at bedtime 09/20/18   Ahmed Prima, [place] M, PA-C  gabapentin (NEURONTIN) 300 MG capsule Take 300 mg by mouth 3 (three) times daily. 08/10/18   [provider]  methocarbamol (ROBAXIN) 500 MG tablet Take 500 mg by mouth 2 (two) times daily. 08/19/18   [provider]  metoprolol succinate (TOPROL-XL) 25 MG 24 hr tablet Take 1 tablet (25 mg total) by mouth 2 (two) times daily. 09/20/18  Ahmed Prima, [place] M, PA-C  Misc Natural Products (BETA-SITOSTEROL PLANT STEROLS) CAPS Take 1 capsule by mouth daily.    [provider]  omeprazole (PRILOSEC) 20 MG capsule Take 1 capsule (20 mg total) by mouth daily for 7 days. 01/29/19 02/05/19  Duffy Bruce, MD  OVER THE COUNTER MEDICATION See admin instructions. Nitro Beets otc supplement - 1 rounded teaspoonful once daily    [provider]  oxyCODONE-acetaminophen (PERCOCET) 10-325 MG  tablet Take 1 tablet by mouth 4 (four) times daily as needed for pain. Patient taking differently: Take 1 tablet by mouth 5 (five) times daily.  10/16/18   Thurnell Lose, MD  pantoprazole (PROTONIX) 40 MG tablet Take 1 tablet (40 mg total) by mouth 2 (two) times daily. Patient not taking: Reported on 01/29/2019 10/16/18   Thurnell Lose, MD  polyethylene glycol [facility] / GLYCOLAX) packet Take 17 g by mouth daily.    [provider]  potassium chloride (K-DUR,KLOR-CON) 20 MEQ tablet Take 1 tablet (20 mEq total) by mouth daily. Patient taking differently: Take 20 mEq by mouth at bedtime.  09/20/18   Strader, Fransisco Hertz, PA-C  QUEtiapine (SEROQUEL) 25 MG tablet Take 25 mg by mouth at bedtime. 09/25/18   [provider]  rOPINIRole (REQUIP) 0.25 MG tablet Take 0.25 mg by mouth 2 (two) times daily.  10/09/18   [provider]  silver sulfADIAZINE (SILVADENE) 1 % cream Apply 1 application topically 2 (two) times daily.    [provider]  simvastatin (ZOCOR) 40 MG tablet Take 40 mg by mouth at bedtime.    [provider]  topiramate (TOPAMAX) 25 MG tablet Take 25 mg by mouth 2 (two) times daily. 09/21/18   [provider]  TURMERIC PO Take 1-2 capsules by mouth daily.     [provider]    Past Medical History: Past Medical History:  Diagnosis Date  . Cancer ([place])   . Carpal tunnel syndrome   . Cervical spondylosis with myelopathy   . Chronic pain syndrome   . Depression   . Difficult intubation    needs smaller tube; awake oral fiberoptic scope 8.0 ETT 03/30/08  . Dysphagia, pharyngeal phase   . Hemorrhage of gastrointestinal tract, unspecified   . History of cardiac cath    a. 03/2018 Cath: LAD and LCX with mild luminal irregularities.  No significant disease.  . Iron deficiency anemia, unspecified   . Lumbosacral spondylosis without myelopathy   . Muscle weakness (generalized)   . Other and unspecified disc disorder of  cervical region   . Other and unspecified hyperlipidemia   . Patent foramen ovale    a. 05/2018 Echo: post-op TAVR--> + bublble study w/ L->R shunt.  . Permanent atrial fibrillation ([place])    a.  Diagnosed in the spring 2019.  Had rapid atrial fibrillation following TAVR and has been rate controlled with beta-blocker.  CHA2DS2VASc equals 7.  Supposed to be on Eliquis.  . Primary localized osteoarthrosis, lower leg   . Primary localized osteoarthrosis, shoulder region   . Reflux esophagitis   . Severe aortic stenosis    a. mild-mod AS by 01/2016 TTE, restricted AV opening with mod AR 01/2016 TEE; b. 10/2017 Echo: EF 60-65%, sev Ca2+ AoV, mean grad 18mmHg; c. 05/2018 s/p TAVR ([facility]); d. 06/2018 Echo: EF 55-60%, well-seated prosth AoV, peak velocity 156cm/s. AoV gradient 15mmHg.  Marland Kitchen Spinal stenosis, unspecified region other than cervical   . Stroke ([place])    01/2016  . Syncope  and collapse   . Unspecified arthropathy, lower leg   . Unspecified constipation   . Unspecified essential hypertension   . Unspecified glaucoma(365.9)     Past Surgical History: Past Surgical History:  Procedure Laterality Date  . BILATERAL CATARACT SURGERY  2009   DR GROAT   . BIOPSY  10/14/2018   Procedure: BIOPSY;  Surgeon: Wonda Horner, MD;  Location: [facility] ENDOSCOPY;  Service: Endoscopy;;  . CARDIAC VALVE REPLACEMENT  05/2018  . CERVIACAL SPINE (4X)     DR MARK ROY   . COLONOSCOPY  2007   DR HENSEL   . COLONOSCOPY WITH PROPOFOL N/A 11/20/2017   Procedure: COLONOSCOPY WITH PROPOFOL;  Surgeon: Wilford Corner, MD;  Location: [place];  Service: Endoscopy;  Laterality: N/A;  . ESOPHAGOGASTRODUODENOSCOPY (EGD) WITH PROPOFOL N/A 10/14/2018   Procedure: ESOPHAGOGASTRODUODENOSCOPY (EGD) WITH PROPOFOL;  Surgeon: Wonda Horner, MD;  Location: [facility] ENDOSCOPY;  Service: Endoscopy;  Laterality: N/A;  . JOINT REPLACEMENT     both knees  . LEFT KNEE REPLACEMENT     DR Maureen Ralphs  . LEFT TRANSVERSE CARPAL LIGAMENT  01/06/2008   . ROTATOR CUFF LEFT SHOULDER  2001   DR MURPHY   . SHOULDER OPEN ROTATOR CUFF REPAIR  2006   DR [facility]  . SPINE SURGERY     neck fusion    Family History: Family History  Problem Relation Age of Onset  . Cancer Mother 30       cervical cancer  . Cancer Father 68       lung  cancer    Social History: Social History   Socioeconomic History  . Marital status: Single    Spouse name: Not on file  . Number of children: 0  . Years of education: GED  . Highest education level: Not on file  Occupational History  . Occupation: retired    Comment: 10 years in service, [employer]  . Financial resource strain: Not on file  . Food insecurity    Worry: Not on file    Inability: Not on file  . Transportation needs    Medical: Not on file    Non-medical: Not on file  Tobacco Use  . Smoking status: Former Smoker    Quit date: 08/29/1985    Years since quitting: 34.1  . Smokeless tobacco: Former Systems developer    Quit date: 02/12/1985  Substance and Sexual Activity  . Alcohol use: No  . Drug use: No  . Sexual activity: Not on file  Lifestyle  . Physical activity    Days per week: Not on file    Minutes per session: Not on file  . Stress: Not on file  Relationships  . Social Herbalist on phone: Not on file    Gets together: Not on file    Attends religious service: Not on file    Active member of club or organization: Not on file    Attends meetings of clubs or organizations: Not on file    Relationship status: Not on file  Other Topics Concern  . Not on file  Social History Narrative   Lives alone   caffeine use - none    Allergies:  Allergies  Allergen Reactions  . Lorazepam Other (See Comments)    Delirium   . Duloxetine Other (See Comments)    Increased depression, irritability  . Pregabalin Swelling and Other (See Comments)    Flashbacks, Bad dreams, Aggressive behavior, swelling  .  Zanaflex [Tizanidine Hcl] Anxiety     Review of  Systems: [y] = yes, [ ]  = no   . General: Weight gain [ ] ; Weight loss [ ] ; Anorexia [ ] ; Fatigue [ ] ; Fever [ ] ; Chills [ ] ; Weakness [ ]   . Cardiac: Chest pain/pressure [ ] ; Resting SOB [ ] ; Exertional SOB [Y]; Orthopnea [ ] ; Pedal Edema [ ] ; Palpitations [Y]; Syncope [ ] ; Presyncope [ ] ; Paroxysmal nocturnal dyspnea[ ]   . Pulmonary: Cough [ ] ; Wheezing[ ] ; Hemoptysis[ ] ; Sputum [ ] ; Snoring [ ]   . GI: Vomiting[ ] ; Dysphagia[ ] ; Melena[ ] ; Hematochezia [ ] ; Heartburn[ ] ; Abdominal pain [ ] ; Constipation [ ] ; Diarrhea [ ] ; BRBPR [ ]   . GU: Hematuria[ ] ; Dysuria [ ] ; Nocturia[ ]   . Vascular: Pain in legs with walking [ ] ; Pain in feet with lying flat [ ] ; Non-healing sores [ ] ; Stroke [ ] ; TIA [ ] ; Slurred speech [ ] ;  . Neuro: Headaches[ ] ; Vertigo[ ] ; Seizures[ ] ; Paresthesias[ ] ;Blurred vision [ ] ; Diplopia [ ] ; Vision changes [ ]   . Ortho/Skin: Arthritis [ ] ; Joint pain [ ] ; Muscle pain [ ] ; Joint swelling [ ] ; Back Pain [ ] ; Rash [ ]   . Psych: Depression[ ] ; Anxiety[ ]   . Heme: Bleeding problems [ ] ; Clotting disorders [ ] ; Anemia [ ]   . Endocrine: Diabetes [ ] ; Thyroid dysfunction[ ]      Objective:    Vital Signs:   Temp:  [98.3 F (36.8 C)] 98.3 F (36.8 C) (10/20 1827) Pulse Rate:  [93-145] 96 (10/20 2030) Resp:  [11-18] 11 (10/20 2030) BP: (111-143)/(74-100) 130/74 (10/20 2030) SpO2:  [99 %-100 %] 100 % (10/20 2030)    Weight change: There were no vitals filed for this visit.  Intake/Output:   Intake/Output Summary (Last 24 hours) at 10/06/2019 2059 Last data filed at 10/06/2019 1932 Gross per 24 hour  Intake 2.06 ml  Output -  Net 2.06 ml      Physical Exam    General:  Alert and oriented HEENT: normal Neck: supple. Carotids 2+ bilat; no bruits. No lymphadenopathy or thyromegaly appreciated. Cor: PMI nondisplaced. Irregularly irregular. No murmurs Lungs: clear to auscultation Abdomen: soft, nontender, nondistended. No hepatosplenomegaly. No bruits or masses.   Extremities: no cyanosis, clubbing, rash, edema Neuro: alert & orientedx3, cranial nerves grossly intact. moves all 4 extremities w/o difficulty.  Affect pleasant    EKG    Reviewed personally: Atrial fibrillation with RVR.  There were no ST elevations or depressions.  There was poor R wave progression in the anterior precordial leads.  Labs   Basic Metabolic Panel: Recent Labs  Lab 10/06/19 1847  NA 137  K 3.9  CL 100  CO2 23  GLUCOSE 86  BUN 14  CREATININE 1.25*  CALCIUM 9.6  MG 1.9    Liver Function Tests: Recent Labs  Lab 10/06/19 1847  AST 20  ALT 15  ALKPHOS 96  BILITOT 0.6  PROT 8.2*  ALBUMIN 3.6   No results for input(s): LIPASE, AMYLASE in the last 168 hours. No results for input(s): AMMONIA in the last 168 hours.  CBC: Recent Labs  Lab 10/06/19 1831  WBC 8.2  HGB 11.8*  HCT 38.1*  MCV 85.4  PLT 358    Cardiac Enzymes: No results for input(s): CKTOTAL, CKMB, CKMBINDEX, TROPONINI in the last 168 hours.  BNP: BNP (last 3 results) No results for input(s): BNP in the last 8760 hours.  ProBNP (last 3 results)  No results for input(s): PROBNP in the last 8760 hours.   CBG: No results for input(s): GLUCAP in the last 168 hours.  Coagulation Studies: Recent Labs    10/06/19 1831  LABPROT 15.9*  INR 1.3*     Imaging   Dg Chest Port 1 View  Result Date: 10/06/2019 CLINICAL DATA:  Chest pain, tachycardia, atrial fibrillation with rapid ventricular response EXAM: PORTABLE CHEST 1 VIEW COMPARISON:  CT 01/31/2011 FINDINGS: Coarse basilar interstitial changes are similar to prior. Basilar areas of atelectasis. No consolidation, features of edema, pneumothorax, or effusion. Mild cardiomegaly, stable to comparison exams. Atherosclerotic calcification of the aortic arch. Cervical and thoracic fusion hardware is present. Postsurgical changes from right rotator cuff repair. Severe arthrosis noted in both shoulders. Multilevel degenerative changes  present in the spine. IMPRESSION: Unchanged coarse basilar interstitial changes. Streaky areas of basilar atelectasis. No other acute cardiopulmonary abnormality. Electronically Signed   By: Lovena Le M.D.   On: 10/06/2019 19:17      Medications:     Current Medications:   Infusions: . diltiazem (CARDIZEM) infusion 7.5 mg/hr (10/06/19 1932)      Assessment/Plan   1. Permanent atrial fibrillation The patient presents with shortness of breath and dizziness. His ECG documented atrial fibrillation with a rapid ventricular rate. He was started on IV Diltiazem with his heart rate coming down to around a 100 bpm. CHA2DS2VASc of 6 - Unclear if he is on Eliquis. This could be due to concerns for a GI bleed in the past.  -Monitor on telemetry -AV nodal blockers for rate control (Diltiazem IV boluses vs infusion).  -Continue PO beta blockers. -Restart Eliquis if there are no current contra-indications -Daily weights -Strict I and Os -Lasix as needed to keep euvolemic -Maintain serum potassium >4.0 and magnesium >2.0     Meade Maw, MD  10/06/2019, 8:59 PM  Cardiology Overnight Team Please contact [facility] for night-coverage after hours (4p -7a ) and weekends on [email or web address]-

## 2019-10-06 NOTE — Progress Notes (Signed)
ANTICOAGULATION CONSULT NOTE - Initial Consult  Pharmacy Consult for apixaban Indication: atrial fibrillation  Allergies  Allergen Reactions  . Lorazepam Other (See Comments)    Delirium   . Duloxetine Other (See Comments)    Increased depression, irritability  . Pregabalin Swelling and Other (See Comments)    Flashbacks, Bad dreams, Aggressive behavior, swelling  . Zanaflex [Tizanidine Hcl] Anxiety    Patient Measurements: Ht: 5'9"  Vital Signs: Temp: 98.3 F (36.8 C) (10/20 1827) Temp Source: Oral (10/20 1827) BP: 130/74 (10/20 2030) Pulse Rate: 96 (10/20 2030)  Labs: Recent Labs    10/06/19 1831 10/06/19 1847  HGB 11.8*  --   HCT 38.1*  --   PLT 358  --   LABPROT 15.9*  --   INR 1.3*  --   CREATININE  --  1.25*    CrCl cannot be calculated (Unknown ideal weight.).   Medical History: Past Medical History:  Diagnosis Date  . Cancer ([place])   . Carpal tunnel syndrome   . Cervical spondylosis with myelopathy   . Chronic pain syndrome   . Depression   . Difficult intubation    needs smaller tube; awake oral fiberoptic scope 8.0 ETT 03/30/08  . Dysphagia, pharyngeal phase   . Hemorrhage of gastrointestinal tract, unspecified   . History of cardiac cath    a. 03/2018 Cath: LAD and LCX with mild luminal irregularities.  No significant disease.  . Iron deficiency anemia, unspecified   . Lumbosacral spondylosis without myelopathy   . Muscle weakness (generalized)   . Other and unspecified disc disorder of cervical region   . Other and unspecified hyperlipidemia   . Patent foramen ovale    a. 05/2018 Echo: post-op TAVR--> + bublble study w/ L->R shunt.  . Permanent atrial fibrillation ([place])    a.  Diagnosed in the spring 2019.  Had rapid atrial fibrillation following TAVR and has been rate controlled with beta-blocker.  CHA2DS2VASc equals 7.  Supposed to be on Eliquis.  . Primary localized osteoarthrosis, lower leg   . Primary localized osteoarthrosis, shoulder  region   . Reflux esophagitis   . Severe aortic stenosis    a. mild-mod AS by 01/2016 TTE, restricted AV opening with mod AR 01/2016 TEE; b. 10/2017 Echo: EF 60-65%, sev Ca2+ AoV, mean grad 79mmHg; c. 05/2018 s/p TAVR ([facility]); d. 06/2018 Echo: EF 55-60%, well-seated prosth AoV, peak velocity 156cm/s. AoV gradient 31mmHg.  Marland Kitchen Spinal stenosis, unspecified region other than cervical   . Stroke ([place])    01/2016  . Syncope and collapse   . Unspecified arthropathy, lower leg   . Unspecified constipation   . Unspecified essential hypertension   . Unspecified glaucoma(365.9)     Medications:  Scheduled:  . magnesium sulfate  1 g Intravenous Once  . potassium chloride  20 mEq Oral Once    Assessment: 59 yom presenting with Afib with RVR and dyspnea- on apixaban PTA (LD possibly Sunday?).   Hgb 11.8, plt 358. Scr 1.25. No s/sx of bleeding.   Given age<80 and Scr<1.5, will continue with full dose apixaban.   Goal of Therapy:  Monitor platelets by anticoagulation protocol: Yes   Plan:  Apixaban 5 mg twice daily Monitor renal fx, CBC, and for s/sx of bleeding  Antonietta Jewel, PharmD, BCCCP Clinical Pharmacist  Phone: [phone]  Please check AMION for all [place] phone numbers After 10:00 PM, call [place] [phone] 10/06/2019,9:23 PM

## 2019-10-06 NOTE — ED Notes (Signed)
Dr. Marlowe Sax at bedside; notified of patient's abdominal pain complaint.

## 2019-10-06 NOTE — H&P (Signed)
History and Physical    [name] [id number] DOB: 1941/03/14 DOA: 10/06/2019  PCP: Hayden Rasmussen, MD Patient coming from: Pain clinic  Chief Complaint: A. fib with RVR  HPI: [name] is a 78 y.o. male with medical history significant of permanent atrial fibrillation, chronic pain syndrome, depression, PFO, severe aortic stenosis status post TAVR, stroke, hypertension, and conditions listed below presenting to the hospital from his pain clinic for evaluation of A. fib with RVR.  History limited as patient is a poor historian.  Patient states he went to see his doctor today elevated test on his heart and told him there was a problem and sent him here.  Denies palpitations, chest pain, or lightheadedness/dizziness.  States he is always short of breath and is not able to give any details.  States he takes Eliquis twice daily.  He is complaining of pain in his periumbilical region which started a day ago.  Denies diarrhea.  States he normally has a bowel movement every other day.  He had a large bowel movement this morning.  Denies nausea or vomiting.  Denies melena or hematochezia.  He has been able to eat food without any problems.  ED Course: Rate in the 140s on arrival.  Blood pressure stable.  No leukocytosis.  TSH normal.  Chest x-ray showing unchanged coarse basilar interstitial changes.  Streaky areas of basilar atelectasis.  No other acute cardiopulmonary abnormality. Patient started on Cardizem infusion.  SARS-CoV-2 test pending.  Review of Systems:  All systems reviewed and apart from history of presenting illness, are negative.  Past Medical History:  Diagnosis Date  . Cancer ([place])   . Carpal tunnel syndrome   . Cervical spondylosis with myelopathy   . Chronic pain syndrome   . Depression   . Difficult intubation    needs smaller tube; awake oral fiberoptic scope 8.0 ETT 03/30/08  . Dysphagia, pharyngeal phase   . Hemorrhage of gastrointestinal tract, unspecified   .  History of cardiac cath    a. 03/2018 Cath: LAD and LCX with mild luminal irregularities.  No significant disease.  . Iron deficiency anemia, unspecified   . Lumbosacral spondylosis without myelopathy   . Muscle weakness (generalized)   . Other and unspecified disc disorder of cervical region   . Other and unspecified hyperlipidemia   . Patent foramen ovale    a. 05/2018 Echo: post-op TAVR--> + bublble study w/ L->R shunt.  . Permanent atrial fibrillation ([place])    a.  Diagnosed in the spring 2019.  Had rapid atrial fibrillation following TAVR and has been rate controlled with beta-blocker.  CHA2DS2VASc equals 7.  Supposed to be on Eliquis.  . Primary localized osteoarthrosis, lower leg   . Primary localized osteoarthrosis, shoulder region   . Reflux esophagitis   . Severe aortic stenosis    a. mild-mod AS by 01/2016 TTE, restricted AV opening with mod AR 01/2016 TEE; b. 10/2017 Echo: EF 60-65%, sev Ca2+ AoV, mean grad 61mmHg; c. 05/2018 s/p TAVR ([facility]); d. 06/2018 Echo: EF 55-60%, well-seated prosth AoV, peak velocity 156cm/s. AoV gradient 57mmHg.  Marland Kitchen Spinal stenosis, unspecified region other than cervical   . Stroke ([place])    01/2016  . Syncope and collapse   . Unspecified arthropathy, lower leg   . Unspecified constipation   . Unspecified essential hypertension   . Unspecified glaucoma(365.9)     Past Surgical History:  Procedure Laterality Date  . BILATERAL CATARACT SURGERY  2009   DR GROAT   .  BIOPSY  10/14/2018   Procedure: BIOPSY;  Surgeon: Wonda Horner, MD;  Location: [facility] ENDOSCOPY;  Service: Endoscopy;;  . CARDIAC VALVE REPLACEMENT  05/2018  . CERVIACAL SPINE (4X)     DR MARK ROY   . COLONOSCOPY  2007   DR HENSEL   . COLONOSCOPY WITH PROPOFOL N/A 11/20/2017   Procedure: COLONOSCOPY WITH PROPOFOL;  Surgeon: Wilford Corner, MD;  Location: [facility];  Service: Endoscopy;  Laterality: N/A;  . ESOPHAGOGASTRODUODENOSCOPY (EGD) WITH PROPOFOL N/A 10/14/2018   Procedure:  ESOPHAGOGASTRODUODENOSCOPY (EGD) WITH PROPOFOL;  Surgeon: Wonda Horner, MD;  Location: [facility] ENDOSCOPY;  Service: Endoscopy;  Laterality: N/A;  . JOINT REPLACEMENT     both knees  . LEFT KNEE REPLACEMENT     DR Maureen Ralphs  . LEFT TRANSVERSE CARPAL LIGAMENT  01/06/2008  . ROTATOR CUFF LEFT SHOULDER  2001   DR MURPHY   . SHOULDER OPEN ROTATOR CUFF REPAIR  2006   DR [facility]  . SPINE SURGERY     neck fusion     reports that he quit smoking about 34 years ago. He quit smokeless tobacco use about 34 years ago. He reports that he does not drink alcohol or use drugs.  Allergies  Allergen Reactions  . Lorazepam Other (See Comments)    Delirium   . Duloxetine Other (See Comments)    Increased depression, irritability  . Pregabalin Swelling and Other (See Comments)    Flashbacks, Bad dreams, Aggressive behavior, swelling  . Zanaflex [Tizanidine Hcl] Anxiety    Family History  Problem Relation Age of Onset  . Cancer Mother 30       cervical cancer  . Cancer Father 44       lung  cancer    Prior to Admission medications   Medication Sig Start Date End Date Taking? Authorizing Provider  apixaban (ELIQUIS) 5 MG TABS tablet Take 1 tablet (5 mg total) by mouth 2 (two) times daily. 10/20/18   Thurnell Lose, MD  aspirin EC 81 MG tablet Take 81 mg by mouth daily.    [provider]  buPROPion (WELLBUTRIN XL) 150 MG 24 hr tablet Take 150 mg by mouth daily. 09/11/18   [provider]  COLLAGEN PO Take 6 capsules by mouth daily.    [provider]  cyanocobalamin (,VITAMIN B-12,) 1000 MCG/ML injection Inject 1,000 mcg into the muscle every 30 (thirty) days. 07/24/18   [provider]  Dextran 70-Hypromellose (ARTIFICIAL TEARS PF OP) Place 1-2 drops into both eyes 3 (three) times daily as needed (for dryness).    [provider]  fluticasone (FLONASE) 50 MCG/ACT nasal spray Place 1 spray into both nostrils daily as needed for allergies or rhinitis.   07/30/18   [provider]  furosemide (LASIX) 20 MG tablet Take 2 tablets (40mg ) in AM and 1 tablet (20mg  in PM) Patient taking differently: Take 20-40 mg by mouth See admin instructions. Take 40 mg by mouth in the morning and 20 mg at bedtime 09/20/18   Ahmed Prima, [place] M, PA-C  gabapentin (NEURONTIN) 300 MG capsule Take 300 mg by mouth 3 (three) times daily. 08/10/18   [provider]  methocarbamol (ROBAXIN) 500 MG tablet Take 500 mg by mouth 2 (two) times daily. 08/19/18   [provider]  metoprolol succinate (TOPROL-XL) 25 MG 24 hr tablet Take 1 tablet (25 mg total) by mouth 2 (two) times daily. 09/20/18   Strader, Fransisco Hertz, PA-C  Misc Natural Products (BETA-SITOSTEROL PLANT STEROLS) CAPS  Take 1 capsule by mouth daily.    [provider]  omeprazole (PRILOSEC) 20 MG capsule Take 1 capsule (20 mg total) by mouth daily for 7 days. 01/29/19 02/05/19  Duffy Bruce, MD  OVER THE COUNTER MEDICATION See admin instructions. Nitro Beets otc supplement - 1 rounded teaspoonful once daily    [provider]  oxyCODONE-acetaminophen (PERCOCET) 10-325 MG tablet Take 1 tablet by mouth 4 (four) times daily as needed for pain. Patient taking differently: Take 1 tablet by mouth 5 (five) times daily.  10/16/18   Thurnell Lose, MD  pantoprazole (PROTONIX) 40 MG tablet Take 1 tablet (40 mg total) by mouth 2 (two) times daily. Patient not taking: Reported on 01/29/2019 10/16/18   Thurnell Lose, MD  polyethylene glycol [facility] / GLYCOLAX) packet Take 17 g by mouth daily.    [provider]  potassium chloride (K-DUR,KLOR-CON) 20 MEQ tablet Take 1 tablet (20 mEq total) by mouth daily. Patient taking differently: Take 20 mEq by mouth at bedtime.  09/20/18   Strader, Fransisco Hertz, PA-C  QUEtiapine (SEROQUEL) 25 MG tablet Take 25 mg by mouth at bedtime. 09/25/18   [provider]  rOPINIRole (REQUIP) 0.25 MG tablet Take 0.25 mg by mouth 2 (two) times  daily.  10/09/18   [provider]  silver sulfADIAZINE (SILVADENE) 1 % cream Apply 1 application topically 2 (two) times daily.    [provider]  simvastatin (ZOCOR) 40 MG tablet Take 40 mg by mouth at bedtime.    [provider]  topiramate (TOPAMAX) 25 MG tablet Take 25 mg by mouth 2 (two) times daily. 09/21/18   [provider]  TURMERIC PO Take 1-2 capsules by mouth daily.     [provider]    Physical Exam: Vitals:   10/06/19 2255 10/06/19 2300 10/06/19 2309 10/07/19 0040  BP: 122/81  (!) 136/96 93/76  Pulse: 91     Resp:   16   Temp: (!) 97.5 F (36.4 C)     TempSrc: Oral     SpO2: 98%     Weight:  101.4 kg    Height:  5\' 9"  (1.753 m)      Physical Exam  Constitutional: He is oriented to person, place, and time. He appears well-developed and well-nourished. No distress.  HENT:  Head: Normocephalic.  Eyes: Right eye exhibits no discharge. Left eye exhibits no discharge.  Neck: Neck supple.  Cardiovascular: Normal rate, regular rhythm and intact distal pulses.  Pulmonary/Chest: Effort normal and breath sounds normal. No respiratory distress. He has no wheezes. He has no rales.  Abdominal: Soft. Bowel sounds are normal. He exhibits distension. There is no abdominal tenderness. There is no rebound and no guarding.  Slightly distended  Musculoskeletal:        General: No edema.  Neurological: He is alert and oriented to person, place, and time.  Skin: Skin is warm and dry. He is not diaphoretic.     Labs on Admission: I have personally reviewed following labs and imaging studies  CBC: Recent Labs  Lab 10/06/19 1831  WBC 8.2  HGB 11.8*  HCT 38.1*  MCV 85.4  PLT 123456   Basic Metabolic Panel: Recent Labs  Lab 10/06/19 1847  NA 137  K 3.9  CL 100  CO2 23  GLUCOSE 86  BUN 14  CREATININE 1.25*  CALCIUM 9.6  MG 1.9   GFR: Estimated Creatinine Clearance: 58.1 mL/min (A) (by C-G formula based on SCr of  1.25 mg/dL  (H)). Liver Function Tests: Recent Labs  Lab 10/06/19 1847  AST 20  ALT 15  ALKPHOS 96  BILITOT 0.6  PROT 8.2*  ALBUMIN 3.6   No results for input(s): LIPASE, AMYLASE in the last 168 hours. No results for input(s): AMMONIA in the last 168 hours. Coagulation Profile: Recent Labs  Lab 10/06/19 1831  INR 1.3*   Cardiac Enzymes: No results for input(s): CKTOTAL, CKMB, CKMBINDEX, TROPONINI in the last 168 hours. BNP (last 3 results) No results for input(s): PROBNP in the last 8760 hours. HbA1C: No results for input(s): HGBA1C in the last 72 hours. CBG: No results for input(s): GLUCAP in the last 168 hours. Lipid Profile: No results for input(s): CHOL, HDL, LDLCALC, TRIG, CHOLHDL, LDLDIRECT in the last 72 hours. Thyroid Function Tests: Recent Labs    10/06/19 1917  TSH 3.303   Anemia Panel: No results for input(s): VITAMINB12, FOLATE, FERRITIN, TIBC, IRON, RETICCTPCT in the last 72 hours. Urine analysis:    Component Value Date/Time   COLORURINE YELLOW 10/13/2018 1232   APPEARANCEUR HAZY (A) 10/13/2018 1232   LABSPEC 1.015 10/13/2018 1232   PHURINE 5.0 10/13/2018 1232   GLUCOSEU NEGATIVE 10/13/2018 1232   HGBUR NEGATIVE 10/13/2018 1232   HGBUR negative 04/08/2007 1327   BILIRUBINUR NEGATIVE 10/13/2018 1232   KETONESUR NEGATIVE 10/13/2018 1232   PROTEINUR NEGATIVE 10/13/2018 1232   UROBILINOGEN 0.2 04/24/2010 1617   NITRITE NEGATIVE 10/13/2018 1232   LEUKOCYTESUR TRACE (A) 10/13/2018 1232    Radiological Exams on Admission: Dg Chest Port 1 View  Result Date: 10/06/2019 CLINICAL DATA:  Chest pain, tachycardia, atrial fibrillation with rapid ventricular response EXAM: PORTABLE CHEST 1 VIEW COMPARISON:  CT 01/31/2011 FINDINGS: Coarse basilar interstitial changes are similar to prior. Basilar areas of atelectasis. No consolidation, features of edema, pneumothorax, or effusion. Mild cardiomegaly, stable to comparison exams. Atherosclerotic calcification of the aortic  arch. Cervical and thoracic fusion hardware is present. Postsurgical changes from right rotator cuff repair. Severe arthrosis noted in both shoulders. Multilevel degenerative changes present in the spine. IMPRESSION: Unchanged coarse basilar interstitial changes. Streaky areas of basilar atelectasis. No other acute cardiopulmonary abnormality. Electronically Signed   By: Lovena Le M.D.   On: 10/06/2019 19:17   Dg Abd 2 Views  Result Date: 10/06/2019 CLINICAL DATA:  Abdominal pain and bloating for 3 days. EXAM: ABDOMEN - 2 VIEW COMPARISON:  None. FINDINGS: No free intra-abdominal air. Increased air throughout nondilated small and large bowel loops in a nonobstructive pattern. Small volume of colonic stool. No radiopaque calculi or abnormal soft tissue calcifications. Minor bibasilar atelectasis. Spinal fusion hardware in the lower thoracic spine is partially included. No acute osseous abnormalities. IMPRESSION: Increased air throughout nondilated small and large bowel loops in a nonobstructive pattern, suggesting generalized ileus. Electronically Signed   By: Keith Rake M.D.   On: 10/06/2019 23:03    EKG: Independently reviewed.  A. fib with RVR, heart rate 144.  Assessment/Plan Principal Problem:   Atrial fibrillation with rapid ventricular response (HCC) Active Problems:   Abdominal pain   A. fib with RVR History of permanent atrial fibrillation on Eliquis and metoprolol at home.  Per pharmacy med rec, noncompliant with Eliquis and metoprolol.  Presenting with complaints of shortness of breath.  Rate up to 140s in the ED.  Blood pressure stable.  CHA2DS2VASc 6.  TSH normal. Received IV Cardizem bolus and started on infusion in the ED.  Rate now improving. -Seen by cardiology -Cardiac monitoring, admit to progressive  care unit -Continue Cardizem infusion -Continue home p.o. metoprolol -Continue Eliquis for anticoagulation -Keep potassium above 4 and magnesium above 2  Abdominal pain  Patient reports 1 day history of periumbilical abdominal pain.  Abdomen appears slightly distended but nontender on exam.  Denies nausea, vomiting, diarrhea, melena, or hematochezia.  Reports having a large bowel movement at home prior to admission.  Abdominal x-ray with findings suggestive of generalized ileus. -Bowel rest -Gentle IV fluid hydration -CT abdomen pelvis for further evaluation  DVT prophylaxis: Eliquis Code Status: Patient wishes to be full code. Family Communication: No family available. Disposition Plan: Anticipate discharge after clinical improvement. Consults called: Cardiology Admission status: It is my clinical opinion that referral for OBSERVATION is reasonable and necessary in this patient based on the above information provided. The aforementioned taken together are felt to place the patient at high risk for further clinical deterioration. However it is anticipated that the patient may be medically stable for discharge from the hospital within 24 to 48 hours.  The medical decision making on this patient was of high complexity and the patient is at high risk for clinical deterioration, therefore this is a level 3 visit.  Shela Leff MD [facility] Pager [phone]  If 7PM-7AM, please contact night-coverage [email or web address] Password [email or web address]  10/07/2019, 1:47 AM

## 2019-10-07 ENCOUNTER — Encounter (HOSPITAL_COMMUNITY): Payer: Self-pay | Admitting: *Deleted

## 2019-10-07 ENCOUNTER — Observation Stay (HOSPITAL_COMMUNITY): Payer: Medicare Other

## 2019-10-07 DIAGNOSIS — D638 Anemia in other chronic diseases classified elsewhere: Secondary | ICD-10-CM | POA: Diagnosis present

## 2019-10-07 DIAGNOSIS — Z79891 Long term (current) use of opiate analgesic: Secondary | ICD-10-CM | POA: Diagnosis not present

## 2019-10-07 DIAGNOSIS — Z8673 Personal history of transient ischemic attack (TIA), and cerebral infarction without residual deficits: Secondary | ICD-10-CM

## 2019-10-07 DIAGNOSIS — I4891 Unspecified atrial fibrillation: Secondary | ICD-10-CM

## 2019-10-07 DIAGNOSIS — N179 Acute kidney failure, unspecified: Secondary | ICD-10-CM

## 2019-10-07 DIAGNOSIS — M4800 Spinal stenosis, site unspecified: Secondary | ICD-10-CM | POA: Diagnosis present

## 2019-10-07 DIAGNOSIS — I10 Essential (primary) hypertension: Secondary | ICD-10-CM | POA: Diagnosis present

## 2019-10-07 DIAGNOSIS — Z7901 Long term (current) use of anticoagulants: Secondary | ICD-10-CM

## 2019-10-07 DIAGNOSIS — G894 Chronic pain syndrome: Secondary | ICD-10-CM | POA: Diagnosis present

## 2019-10-07 DIAGNOSIS — Z20828 Contact with and (suspected) exposure to other viral communicable diseases: Secondary | ICD-10-CM | POA: Diagnosis present

## 2019-10-07 DIAGNOSIS — L8990 Pressure ulcer of unspecified site, unspecified stage: Secondary | ICD-10-CM | POA: Insufficient documentation

## 2019-10-07 DIAGNOSIS — E785 Hyperlipidemia, unspecified: Secondary | ICD-10-CM | POA: Diagnosis present

## 2019-10-07 DIAGNOSIS — I959 Hypotension, unspecified: Secondary | ICD-10-CM | POA: Diagnosis not present

## 2019-10-07 DIAGNOSIS — R413 Other amnesia: Secondary | ICD-10-CM | POA: Diagnosis present

## 2019-10-07 DIAGNOSIS — R1084 Generalized abdominal pain: Secondary | ICD-10-CM

## 2019-10-07 DIAGNOSIS — Z952 Presence of prosthetic heart valve: Secondary | ICD-10-CM | POA: Diagnosis not present

## 2019-10-07 DIAGNOSIS — M4712 Other spondylosis with myelopathy, cervical region: Secondary | ICD-10-CM | POA: Diagnosis present

## 2019-10-07 DIAGNOSIS — H409 Unspecified glaucoma: Secondary | ICD-10-CM | POA: Diagnosis present

## 2019-10-07 DIAGNOSIS — I4821 Permanent atrial fibrillation: Secondary | ICD-10-CM | POA: Diagnosis present

## 2019-10-07 DIAGNOSIS — R1033 Periumbilical pain: Secondary | ICD-10-CM | POA: Diagnosis present

## 2019-10-07 DIAGNOSIS — M19019 Primary osteoarthritis, unspecified shoulder: Secondary | ICD-10-CM | POA: Diagnosis present

## 2019-10-07 DIAGNOSIS — L89153 Pressure ulcer of sacral region, stage 3: Secondary | ICD-10-CM | POA: Diagnosis present

## 2019-10-07 DIAGNOSIS — F329 Major depressive disorder, single episode, unspecified: Secondary | ICD-10-CM | POA: Diagnosis present

## 2019-10-07 DIAGNOSIS — Q211 Atrial septal defect: Secondary | ICD-10-CM | POA: Diagnosis not present

## 2019-10-07 DIAGNOSIS — I2510 Atherosclerotic heart disease of native coronary artery without angina pectoris: Secondary | ICD-10-CM | POA: Diagnosis present

## 2019-10-07 DIAGNOSIS — R109 Unspecified abdominal pain: Secondary | ICD-10-CM

## 2019-10-07 DIAGNOSIS — M1710 Unilateral primary osteoarthritis, unspecified knee: Secondary | ICD-10-CM | POA: Diagnosis present

## 2019-10-07 DIAGNOSIS — Z96653 Presence of artificial knee joint, bilateral: Secondary | ICD-10-CM | POA: Diagnosis present

## 2019-10-07 LAB — BASIC METABOLIC PANEL
Anion gap: 12 (ref 5–15)
BUN: 15 mg/dL (ref 8–23)
CO2: 22 mmol/L (ref 22–32)
Calcium: 9 mg/dL (ref 8.9–10.3)
Chloride: 104 mmol/L (ref 98–111)
Creatinine, Ser: 0.91 mg/dL (ref 0.61–1.24)
GFR calc Af Amer: >60 mL/min (ref >60)
GFR calc non Af Amer: >60 mL/min (ref >60)
Glucose, Bld: 82 mg/dL (ref 70–99)
Potassium: 3.9 mmol/L (ref 3.5–5.1)
Sodium: 138 mmol/L (ref 135–145)

## 2019-10-07 LAB — CBC
HCT: 34.2 % — ABNORMAL LOW (ref 39.0–52.0)
Hemoglobin: 10.8 g/dL — ABNORMAL LOW (ref 13.0–17.0)
MCH: 26.7 pg (ref 26.0–34.0)
MCHC: 31.6 g/dL (ref 30.0–36.0)
MCV: 84.7 fL (ref 80.0–100.0)
Platelets: 326 10*3/uL (ref 150–400)
RBC: 4.04 MIL/uL — ABNORMAL LOW (ref 4.22–5.81)
RDW: 18.4 % — ABNORMAL HIGH (ref 11.5–15.5)
WBC: 7.6 10*3/uL (ref 4.0–10.5)
nRBC: 0 % (ref 0.0–0.2)

## 2019-10-07 LAB — SARS CORONAVIRUS 2 (TAT 6-24 HRS): SARS Coronavirus 2: NEGATIVE

## 2019-10-07 LAB — MAGNESIUM: Magnesium: 2 mg/dL (ref 1.7–2.4)

## 2019-10-07 MED ORDER — DILTIAZEM HCL 60 MG PO TABS
30.0000 mg | ORAL_TABLET | Freq: Four times a day (QID) | ORAL | Status: DC
  Administered 2019-10-07: 30 mg via ORAL
  Filled 2019-10-07 (×2): qty 1

## 2019-10-07 MED ORDER — MAGNESIUM SULFATE IN D5W 1-5 GM/100ML-% IV SOLN
1.0000 g | Freq: Once | INTRAVENOUS | Status: AC
  Administered 2019-10-07: 1 g via INTRAVENOUS
  Filled 2019-10-07: qty 100

## 2019-10-07 MED ORDER — SODIUM CHLORIDE 0.9 % IV SOLN
INTRAVENOUS | Status: DC
  Administered 2019-10-07: 03:00:00 via INTRAVENOUS

## 2019-10-07 MED ORDER — SENNOSIDES-DOCUSATE SODIUM 8.6-50 MG PO TABS
1.0000 | ORAL_TABLET | Freq: Two times a day (BID) | ORAL | Status: DC | PRN
  Administered 2019-10-07: 1 via ORAL
  Filled 2019-10-07: qty 1

## 2019-10-07 MED ORDER — METOPROLOL TARTRATE 25 MG PO TABS
25.0000 mg | ORAL_TABLET | Freq: Once | ORAL | Status: DC
  Administered 2019-10-07: 25 mg via ORAL
  Filled 2019-10-07: qty 1

## 2019-10-07 MED ORDER — OXYCODONE-ACETAMINOPHEN 5-325 MG PO TABS
1.0000 | ORAL_TABLET | Freq: Four times a day (QID) | ORAL | Status: DC | PRN
  Administered 2019-10-07: 2 via ORAL
  Administered 2019-10-07: 1 via ORAL
  Administered 2019-10-07 – 2019-10-08 (×3): 2 via ORAL
  Filled 2019-10-07 (×2): qty 1
  Filled 2019-10-07 (×2): qty 2
  Filled 2019-10-07: qty 1
  Filled 2019-10-07: qty 2

## 2019-10-07 MED ORDER — METOPROLOL SUCCINATE ER 25 MG PO TB24
25.0000 mg | ORAL_TABLET | Freq: Two times a day (BID) | ORAL | Status: DC
  Administered 2019-10-07: 25 mg via ORAL
  Filled 2019-10-07: qty 1

## 2019-10-07 MED ORDER — POLYETHYLENE GLYCOL 3350 17 G PO PACK
17.0000 g | PACK | Freq: Every day | ORAL | Status: DC | PRN
  Administered 2019-10-07: 17 g via ORAL
  Filled 2019-10-07: qty 1

## 2019-10-07 MED ORDER — METOPROLOL TARTRATE 50 MG PO TABS: 50.0000 mg | ORAL_TABLET | Freq: Two times a day (BID) | ORAL | Status: DC

## 2019-10-07 MED ORDER — OXYCODONE-ACETAMINOPHEN 5-325 MG PO TABS
1.0000 | ORAL_TABLET | Freq: Once | ORAL | Status: AC
  Administered 2019-10-07: 1 via ORAL
  Filled 2019-10-07: qty 1

## 2019-10-07 MED ORDER — METOPROLOL SUCCINATE ER 25 MG PO TB24: 25.0000 mg | ORAL_TABLET | Freq: Two times a day (BID) | ORAL | Status: DC

## 2019-10-07 MED ORDER — DILTIAZEM HCL ER COATED BEADS 120 MG PO CP24
120.0000 mg | ORAL_CAPSULE | Freq: Every day | ORAL | Status: DC
  Administered 2019-10-07 – 2019-10-08 (×2): 120 mg via ORAL
  Filled 2019-10-07 (×2): qty 1

## 2019-10-07 MED ORDER — METOPROLOL TARTRATE 25 MG PO TABS
25.0000 mg | ORAL_TABLET | Freq: Two times a day (BID) | ORAL | Status: DC
  Administered 2019-10-07 – 2019-10-08 (×2): 25 mg via ORAL
  Filled 2019-10-07 (×2): qty 1

## 2019-10-07 MED ORDER — OXYCODONE HCL 5 MG PO TABS
5.0000 mg | ORAL_TABLET | Freq: Once | ORAL | Status: AC
  Administered 2019-10-07: 5 mg via ORAL
  Filled 2019-10-07: qty 1

## 2019-10-07 MED ORDER — POTASSIUM CHLORIDE CRYS ER 20 MEQ PO TBCR
40.0000 meq | EXTENDED_RELEASE_TABLET | Freq: Once | ORAL | Status: AC
  Administered 2019-10-07: 40 meq via ORAL
  Filled 2019-10-07: qty 2

## 2019-10-07 NOTE — Progress Notes (Signed)
Patient 's has his home med delivered to the pharmacy as well as his wallet with $ 230 and some cards.

## 2019-10-07 NOTE — Evaluation (Signed)
Physical Therapy Evaluation Patient Details Name: [name] MRN: [id number] DOB: Mar 24, 1941 Today's Date: 10/07/2019   History of Present Illness  Pt is a 78 y/o male admitted from pain clinic secondary to a-fib with RVR. PMH including but not limited to permanent atrial fibrillation, chronic pain syndrome, depression, PFO, severe aortic stenosis status post TAVR, stroke, hypertension.    Clinical Impression  Pt presented in bathroom with nurse tech present, awake and willing to participate in therapy session. Prior to admission, pt reported that he ambulates with use of a cane and is independent with ADLs. Pt lives with his sister in a single level home with a level entry. Pt reporting that the home is all handicap accessible. At the time of evaluation, pt limited secondary to increasing HR into the low 140's with very short distance ambulation within his room. Pt reported that he is unable to tell that this is occurring. Pt generally very slow, cautious and unsteady with ambulation; however, reporting that is his baseline. Pt would continue to benefit from skilled physical therapy services at this time while admitted and after d/c to address the below listed limitations in order to improve overall safety and independence with functional mobility.     Follow Up Recommendations Home health PT;Supervision for mobility/OOB    Equipment Recommendations  None recommended by PT    Recommendations for Other Services       Precautions / Restrictions Precautions Precautions: Fall Precaution Comments: monitor HR Restrictions Weight Bearing Restrictions: No      Mobility  Bed Mobility Overal bed mobility: Needs Assistance Bed Mobility: Sit to Supine       Sit to supine: Supervision   General bed mobility comments: for safety  Transfers Overall transfer level: Needs assistance Equipment used: Straight cane Transfers: Sit to/from Stand Sit to Stand: Supervision             Ambulation/Gait Ambulation/Gait assistance: Supervision Gait Distance (Feet): 10 Feet Assistive device: Straight cane Gait Pattern/deviations: Step-through pattern;Decreased step length - right;Decreased step length - left;Decreased stride length Gait velocity: decreased   General Gait Details: pt with slow, unsteady gait pattern with use of a cane; limited in distance secondary to increasing HR into low 140's  Stairs            Wheelchair Mobility    Modified Rankin (Stroke Patients Only)       Balance Overall balance assessment: Needs assistance Sitting-balance support: Feet supported Sitting balance-Leahy Scale: Good     Standing balance support: During functional activity;No upper extremity supported Standing balance-Leahy Scale: Fair                               Pertinent Vitals/Pain Pain Assessment: No/denies pain    Home Living Family/patient expects to be discharged to:: Private residence Living Arrangements: Other relatives;Other (Comment)(lives with his sister) Available Help at Discharge: Family;Available 24 hours/day Type of Home: House Home Access: Level entry     Home Layout: One level Home Equipment: Cane - single point      Prior Function Level of Independence: Independent with assistive device(s)         Comments: ambulates with a cane     Hand Dominance        Extremity/Trunk Assessment   Upper Extremity Assessment Upper Extremity Assessment: Generalized weakness    Lower Extremity Assessment Lower Extremity Assessment: Generalized weakness    Cervical / Trunk Assessment Cervical /  Trunk Assessment: Other exceptions Cervical / Trunk Exceptions: hx of multiple spinal surgeries  Communication   Communication: No difficulties  Cognition Arousal/Alertness: Awake/alert Behavior During Therapy: WFL for tasks assessed/performed Overall Cognitive Status: Within Functional Limits for tasks assessed                                         General Comments      Exercises     Assessment/Plan    PT Assessment Patient needs continued PT services  PT Problem List Decreased balance;Decreased mobility;Decreased coordination;Cardiopulmonary status limiting activity       PT Treatment Interventions DME instruction;Gait training;Stair training;Functional mobility training;Therapeutic activities;Therapeutic exercise;Balance training;Neuromuscular re-education;Patient/family education    PT Goals (Current goals can be found in the Care Plan section)  Acute Rehab PT Goals Patient Stated Goal: "home soon" PT Goal Formulation: With patient Time For Goal Achievement: 10/21/19 Potential to Achieve Goals: Good    Frequency Min 3X/week   Barriers to discharge        Co-evaluation               AM-PAC PT "6 Clicks" Mobility  Outcome Measure Help needed turning from your back to your side while in a flat bed without using bedrails?: None Help needed moving from lying on your back to sitting on the side of a flat bed without using bedrails?: None Help needed moving to and from a bed to a chair (including a wheelchair)?: A Little Help needed standing up from a chair using your arms (e.g., wheelchair or bedside chair)?: None Help needed to walk in hospital room?: A Little Help needed climbing 3-5 steps with a railing? : A Lot 6 Click Score: 20    End of Session   Activity Tolerance: Patient tolerated treatment well Patient left: in bed;with call bell/phone within reach;with bed alarm set Nurse Communication: Mobility status PT Visit Diagnosis: Other abnormalities of gait and mobility (R26.89)    Time: [id number] PT Time Calculation (min) (ACUTE ONLY): 28 min   Charges:   PT Evaluation $PT Eval Moderate Complexity: 1 Mod PT Treatments $Gait Training: 8-22 mins        Sherie Don, PT, DPT  Acute Rehabilitation Services Pager [phone] Office  [place] 10/07/2019, 12:01 PM

## 2019-10-07 NOTE — Progress Notes (Signed)
PROGRESS NOTE  [name] ZOX:[id number] DOB: [birth year]   PCP: Dois Davenport, MD  Patient is from: Home.  Lives with his sister.  Uses cane at baseline.  DOA: 10/06/2019 LOS: 0  Brief Narrative / Interim history: 78 year old male with history of permanent A. fib on Eliquis, OA/chronic pain syndrome on opiates, depression, PFO, AS/TAVR, CVA and HTN brought to ED from pain clinic due to A. fib with RVR .  Also had abdominal pain.   In ED, HR in 140s.  BP stable.  Unchanged course basilar interstitial changes on CXR.  COVID-19 negative.  Started on Cardizem drip.  Cardiology consulted.  Admitted.  Subjective: No major events overnight of this morning.  Cardizem drip discontinued early this morning due to hypotension.  HR in 90s to 120s.  Abdominal pain improved.  He denies chest pain, dyspnea, palpitation, dizziness, nausea or vomiting.  He denies UTI symptoms.  Reports good compliance with his medication including Eliquis and metoprolol.   Objective: Vitals:   10/07/19 1103 10/07/19 1203 10/07/19 1303 10/07/19 1328  BP: 127/90 128/87 129/85   Pulse: 97 (!) 104    Resp: 16 12  13   Temp:   97.6 F (36.4 C)   TempSrc:      SpO2: 96% 97%  98%  Weight:      Height:        Intake/Output Summary (Last 24 hours) at 10/07/2019 1350 Last data filed at 10/07/2019 1300 Gross per 24 hour  Intake 1841.4 ml  Output 1250 ml  Net 591.4 ml   Filed Weights   10/06/19 2300  Weight: 101.4 kg    Examination:  GENERAL: No acute distress.  Appears well.  HEENT: MMM.  Vision and hearing grossly intact.  NECK: Supple.  No apparent JVD.  RESP:  No IWOB. Good air movement bilaterally. CVS: IR IR. Heart sounds normal.  ABD/GI/GU: Bowel sounds present. Soft. Non tender.  MSK/EXT:  Moves extremities. No apparent deformity or edema.  SKIN: no apparent skin lesion or wound NEURO: Awake, alert and oriented appropriately.  No gross deficit.  PSYCH: Calm. Normal affect.   Assessment &  Plan: A. fib with RVR: Remains in A. fib with heart rate in 90s to 120s.  CHA2DS2-VASc score greater than 3. -Appreciate cardiology guidance. -P.o. Cardizem CD 120 mg daily -Metoprolol 25 mg twice daily -Continue Eliquis for anticoagulation. -Monitor electrolytes and replenish aggressively.  Abdominal pain: Unclear etiology.  CT abdomen and pelvis revealed nothing right renal stone, supraumbilical hernia without carcinoid but no other significant finding.  LBM yesterday.  Denies nausea/vomiting melena or hematochezia.  Abdominal exam benign. -Start clear liquid diet-we will advance as tolerated. -Bowel regimen for possible opiate induced constipation  AKI: suspect prerenal etiology in the setting of A. fib with RVR.  He is also on Lasix and lisinopril which could contribute.  AKI resolved. -Continue holding Lasix and lisinopril -Metoprolol and Cardizem as above.  Osteoarthritis/chronic pain: Follows at the pain clinic.  Fillls Percocet 10/325 #75 every 15 days.  Last fill 08/18/2019 -Continue home Percocet with bowel regimen.  Essential hypertension: Was hypotensive overnight.  Normotensive this morning -Cardiac meds as above.  History of CVA: No apparent residual deficit. -Continue home medication  Anemia of chronic disease: Stable.  AS/TAVR: Stable.  Depression: Stable -Continue home patient  Pressure Injury 10/06/19 Sacrum Stage III -  Full thickness tissue loss. Subcutaneous fat may be visible but bone, tendon or muscle are NOT exposed. (Active)  10/06/19 2300  Location:  Sacrum  Location Orientation:   Staging: Stage III -  Full thickness tissue loss. Subcutaneous fat may be visible but bone, tendon or muscle are NOT exposed.  Wound Description (Comments):   Present on Admission: Yes    DVT prophylaxis: On Eliquis for A. fib Code Status: Full code Family Communication: Patient and/or RN. Available if any question.  Disposition Plan: Remains inpatient.  Still in A. fib  with RVR. Consultants: Cardiology  Procedures:  None  Microbiology summarized: COVID-19 negative.  Sch Meds:  Scheduled Meds:  apixaban  5 mg Oral BID   diltiazem  120 mg Oral Daily   metoprolol tartrate  25 mg Oral BID   Continuous Infusions:  sodium chloride Stopped (10/07/19 1039)   PRN Meds:.acetaminophen, ondansetron (ZOFRAN) IV, oxyCODONE-acetaminophen, polyethylene glycol, senna-docusate  Antimicrobials: Anti-infectives (From admission, onward)   None       I have personally reviewed the following labs and images: CBC: Recent Labs  Lab 10/06/19 1831 10/07/19 0733  WBC 8.2 7.6  HGB 11.8* 10.8*  HCT 38.1* 34.2*  MCV 85.4 84.7  PLT 358 326   BMP &GFR Recent Labs  Lab 10/06/19 1847 10/07/19 0733  NA 137 138  K 3.9 3.9  CL 100 104  CO2 23 22  GLUCOSE 86 82  BUN 14 15  CREATININE 1.25* 0.91  CALCIUM 9.6 9.0  MG 1.9 2.0   Estimated Creatinine Clearance: 79.8 mL/min (by C-G formula based on SCr of 0.91 mg/dL). Liver & Pancreas: Recent Labs  Lab 10/06/19 1847  AST 20  ALT 15  ALKPHOS 96  BILITOT 0.6  PROT 8.2*  ALBUMIN 3.6   No results for input(s): LIPASE, AMYLASE in the last 168 hours. No results for input(s): AMMONIA in the last 168 hours. Diabetic: No results for input(s): HGBA1C in the last 72 hours. No results for input(s): GLUCAP in the last 168 hours. Cardiac Enzymes: No results for input(s): CKTOTAL, CKMB, CKMBINDEX, TROPONINI in the last 168 hours. No results for input(s): PROBNP in the last 8760 hours. Coagulation Profile: Recent Labs  Lab 10/06/19 1831  INR 1.3*   Thyroid Function Tests: Recent Labs    10/06/19 1917  TSH 3.303   Lipid Profile: No results for input(s): CHOL, HDL, LDLCALC, TRIG, CHOLHDL, LDLDIRECT in the last 72 hours. Anemia Panel: No results for input(s): VITAMINB12, FOLATE, FERRITIN, TIBC, IRON, RETICCTPCT in the last 72 hours. Urine analysis:    Component Value Date/Time   COLORURINE YELLOW  10/13/2018 1232   APPEARANCEUR HAZY (A) 10/13/2018 1232   LABSPEC 1.015 10/13/2018 1232   PHURINE 5.0 10/13/2018 1232   GLUCOSEU NEGATIVE 10/13/2018 1232   HGBUR NEGATIVE 10/13/2018 1232   HGBUR negative 04/08/2007 1327   BILIRUBINUR NEGATIVE 10/13/2018 1232   KETONESUR NEGATIVE 10/13/2018 1232   PROTEINUR NEGATIVE 10/13/2018 1232   UROBILINOGEN 0.2 04/24/2010 1617   NITRITE NEGATIVE 10/13/2018 1232   LEUKOCYTESUR TRACE (A) 10/13/2018 1232   Sepsis Labs: Invalid input(s): PROCALCITONIN, LACTICIDVEN  Microbiology: Recent Results (from the past 240 hour(s))  SARS CORONAVIRUS 2 (TAT 6-24 HRS) Nasopharyngeal Nasopharyngeal Swab     Status: None   Collection Time: 10/06/19  6:50 PM   Specimen: Nasopharyngeal Swab  Result Value Ref Range Status   SARS Coronavirus 2 NEGATIVE NEGATIVE Final    Comment: (NOTE) SARS-CoV-2 target nucleic acids are NOT DETECTED. The SARS-CoV-2 RNA is generally detectable in upper and lower respiratory specimens during the acute phase of infection. Negative results do not preclude SARS-CoV-2 infection, do not rule  out co-infections with other pathogens, and should not be used as the sole basis for treatment or other patient management decisions. Negative results must be combined with clinical observations, patient history, and epidemiological information. The expected result is Negative. Fact Sheet for Patients: HairSlick.no Fact Sheet for Healthcare Providers: quierodirigir.com This test is not yet approved or cleared by the [place] FDA and  has been authorized for detection and/or diagnosis of SARS-CoV-2 by FDA under an Emergency Use Authorization (EUA). This EUA will remain  in effect (meaning this test can be used) for the duration of the COVID-19 declaration under Section 56 4(b)(1) of the Act, 21 U.S.C. section 360bbb-3(b)(1), unless the authorization is terminated or revoked  sooner. Performed at [facility], [address], [place], [place] [address]     Radiology Studies: Ct Abdomen Pelvis Wo Contrast  Result Date: 10/07/2019 CLINICAL DATA:  Generalized acute abdominal pain. EXAM: CT ABDOMEN AND PELVIS WITHOUT CONTRAST TECHNIQUE: Multidetector CT imaging of the abdomen and pelvis was performed following the standard protocol without IV contrast. COMPARISON:  04/25/2010 FINDINGS: Lower chest: Motion artifact. Suggestion of basilar emphysema. Mitral annulus calcifications. Heart is normal in size. Hepatobiliary: No focal liver abnormality is seen. No gallstones, gallbladder wall thickening, or biliary dilatation. Pancreas: Mild parenchymal atrophy. No ductal dilatation or inflammation. Spleen: Normal in size without focal abnormality. Adrenals/Urinary Tract: No adrenal nodule. Nonobstructing stone in the upper right kidney. No hydronephrosis. Mild symmetric perinephric edema is nonspecific. Tiny low-density lesion in the lower spleen is too small to accurately characterize. Ureters are decompressed. Urinary bladder physiologically distended. No bladder wall thickening. Stomach/Bowel: Small hiatal hernia. Gastric suture line. No gastric wall thickening. No small bowel obstruction or inflammatory change. Administered enteric contrast reaches the colon. Normal appendix. No colonic wall thickening. Vascular/Lymphatic: Aortic atherosclerosis and tortuosity. No adenopathy. Reproductive: Prostate is unremarkable. Other: Fat containing supraumbilical ventral abdominal wall hernia. Tiny fat containing umbilical hernia. Fat in both inguinal canals. No ascites. No free air. Musculoskeletal: Spinal fusion hardware in the lower thoracic spine is partially included. Multilevel degenerative change in the lumbar spine. There are no acute or suspicious osseous abnormalities. IMPRESSION: 1. No acute abnormality or explanation for abdominal pain. 2. Nonobstructing right renal stone. 3.  Supraumbilical hernia containing fat and mesenteric vessels. Tiny fat containing umbilical hernia. Aortic Atherosclerosis (ICD10-I70.0). Electronically Signed   By: Narda Rutherford M.D.   On: 10/07/2019 05:20   Dg Chest Port 1 View  Result Date: 10/06/2019 CLINICAL DATA:  Chest pain, tachycardia, atrial fibrillation with rapid ventricular response EXAM: PORTABLE CHEST 1 VIEW COMPARISON:  CT 01/31/2011 FINDINGS: Coarse basilar interstitial changes are similar to prior. Basilar areas of atelectasis. No consolidation, features of edema, pneumothorax, or effusion. Mild cardiomegaly, stable to comparison exams. Atherosclerotic calcification of the aortic arch. Cervical and thoracic fusion hardware is present. Postsurgical changes from right rotator cuff repair. Severe arthrosis noted in both shoulders. Multilevel degenerative changes present in the spine. IMPRESSION: Unchanged coarse basilar interstitial changes. Streaky areas of basilar atelectasis. No other acute cardiopulmonary abnormality. Electronically Signed   By: Kreg Shropshire M.D.   On: 10/06/2019 19:17   Dg Abd 2 Views  Result Date: 10/06/2019 CLINICAL DATA:  Abdominal pain and bloating for 3 days. EXAM: ABDOMEN - 2 VIEW COMPARISON:  None. FINDINGS: No free intra-abdominal air. Increased air throughout nondilated small and large bowel loops in a nonobstructive pattern. Small volume of colonic stool. No radiopaque calculi or abnormal soft tissue calcifications. Minor bibasilar atelectasis. Spinal fusion  hardware in the lower thoracic spine is partially included. No acute osseous abnormalities. IMPRESSION: Increased air throughout nondilated small and large bowel loops in a nonobstructive pattern, suggesting generalized ileus. Electronically Signed   By: Narda Rutherford M.D.   On: 10/06/2019 23:03     Saraya Tirey T. Koleen Celia [facility]  If 7PM-7AM, please contact night-coverage [email or web address] Password [email or web address] 10/07/2019, 1:50 PM

## 2019-10-07 NOTE — Progress Notes (Signed)
SBP in the 70s and and recycle is in 60s. Cardizem drip stopped

## 2019-10-07 NOTE — Progress Notes (Signed)
Patient threatened to go home d/t pain. Paged Baltazar Najjar the 3rd time and she responded with new order for pain medication. No acute distress noted. Order implemented

## 2019-10-07 NOTE — Progress Notes (Addendum)
Progress Note  Patient Name: [name] Date of Encounter: 10/07/2019  Primary Cardiologist: No primary care provider on file.   Subjective   Patient is feeling better. Breathing improved. Denies chest pain. He is overall a poor historian  Inpatient Medications    Scheduled Meds:  apixaban  5 mg Oral BID   diltiazem  30 mg Oral Q6H   metoprolol succinate  25 mg Oral BID   Continuous Infusions:  sodium chloride 100 mL/hr at 10/07/19 0232   PRN Meds: acetaminophen, ondansetron (ZOFRAN) IV, oxyCODONE-acetaminophen   Vital Signs    Vitals:   10/07/19 0600 10/07/19 0800 10/07/19 0835 10/07/19 0836  BP: 126/83 132/80 103/72   Pulse: 93 (!) 113 (!) 57 (!) 119  Resp: 15  11 15   Temp:      TempSrc:      SpO2: 97% 98% 96%   Weight:      Height:        Intake/Output Summary (Last 24 hours) at 10/07/2019 0852 Last data filed at 10/07/2019 0837 Gross per 24 hour  Intake 1439.4 ml  Output 900 ml  Net 539.4 ml   Last 3 Weights 10/06/2019 09/20/2018 09/19/2018  Weight (lbs) 223 lb 8.7 oz 257 lb 11.2 oz 262 lb 4.8 oz  Weight (kg) 101.4 kg 116.892 kg 118.978 kg      Telemetry    Afib, Hr 100-110, peak 120s, occasional PVCs - Personally Reviewed  ECG    10/06/19 afib RVR, 144 bpm, poor R wave progression - Personally Reviewed  Physical Exam   GEN: No acute distress.   Neck: No JVD Cardiac: Irreg Irreg, systolic murmur, rubs, or gallops.  Respiratory: Clear to auscultation bilaterally. GI: Soft, nontender, non-distended  MS: No edema; No deformity. Neuro:  Nonfocal  Psych: Normal affect   Labs    High Sensitivity Troponin:  No results for input(s): TROPONINIHS in the last 720 hours.    Chemistry Recent Labs  Lab 10/06/19 1847  NA 137  K 3.9  CL 100  CO2 23  GLUCOSE 86  BUN 14  CREATININE 1.25*  CALCIUM 9.6  PROT 8.2*  ALBUMIN 3.6  AST 20  ALT 15  ALKPHOS 96  BILITOT 0.6  GFRNONAA 55*  GFRAA >60  ANIONGAP 14     Hematology Recent  Labs  Lab 10/06/19 1831 10/07/19 0733  WBC 8.2 7.6  RBC 4.46 4.04*  HGB 11.8* 10.8*  HCT 38.1* 34.2*  MCV 85.4 84.7  MCH 26.5 26.7  MCHC 31.0 31.6  RDW 18.6* 18.4*  PLT 358 326    BNPNo results for input(s): BNP, PROBNP in the last 168 hours.   DDimer No results for input(s): DDIMER in the last 168 hours.   Radiology    Ct Abdomen Pelvis Wo Contrast  Result Date: 10/07/2019 CLINICAL DATA:  Generalized acute abdominal pain. EXAM: CT ABDOMEN AND PELVIS WITHOUT CONTRAST TECHNIQUE: Multidetector CT imaging of the abdomen and pelvis was performed following the standard protocol without IV contrast. COMPARISON:  04/25/2010 FINDINGS: Lower chest: Motion artifact. Suggestion of basilar emphysema. Mitral annulus calcifications. Heart is normal in size. Hepatobiliary: No focal liver abnormality is seen. No gallstones, gallbladder wall thickening, or biliary dilatation. Pancreas: Mild parenchymal atrophy. No ductal dilatation or inflammation. Spleen: Normal in size without focal abnormality. Adrenals/Urinary Tract: No adrenal nodule. Nonobstructing stone in the upper right kidney. No hydronephrosis. Mild symmetric perinephric edema is nonspecific. Tiny low-density lesion in the lower spleen is too small to accurately characterize. Ureters  are decompressed. Urinary bladder physiologically distended. No bladder wall thickening. Stomach/Bowel: Small hiatal hernia. Gastric suture line. No gastric wall thickening. No small bowel obstruction or inflammatory change. Administered enteric contrast reaches the colon. Normal appendix. No colonic wall thickening. Vascular/Lymphatic: Aortic atherosclerosis and tortuosity. No adenopathy. Reproductive: Prostate is unremarkable. Other: Fat containing supraumbilical ventral abdominal wall hernia. Tiny fat containing umbilical hernia. Fat in both inguinal canals. No ascites. No free air. Musculoskeletal: Spinal fusion hardware in the lower thoracic spine is partially  included. Multilevel degenerative change in the lumbar spine. There are no acute or suspicious osseous abnormalities. IMPRESSION: 1. No acute abnormality or explanation for abdominal pain. 2. Nonobstructing right renal stone. 3. Supraumbilical hernia containing fat and mesenteric vessels. Tiny fat containing umbilical hernia. Aortic Atherosclerosis (ICD10-I70.0). Electronically Signed   By: Keith Rake M.D.   On: 10/07/2019 05:20   Dg Chest Port 1 View  Result Date: 10/06/2019 CLINICAL DATA:  Chest pain, tachycardia, atrial fibrillation with rapid ventricular response EXAM: PORTABLE CHEST 1 VIEW COMPARISON:  CT 01/31/2011 FINDINGS: Coarse basilar interstitial changes are similar to prior. Basilar areas of atelectasis. No consolidation, features of edema, pneumothorax, or effusion. Mild cardiomegaly, stable to comparison exams. Atherosclerotic calcification of the aortic arch. Cervical and thoracic fusion hardware is present. Postsurgical changes from right rotator cuff repair. Severe arthrosis noted in both shoulders. Multilevel degenerative changes present in the spine. IMPRESSION: Unchanged coarse basilar interstitial changes. Streaky areas of basilar atelectasis. No other acute cardiopulmonary abnormality. Electronically Signed   By: Lovena Le M.D.   On: 10/06/2019 19:17   Dg Abd 2 Views  Result Date: 10/06/2019 CLINICAL DATA:  Abdominal pain and bloating for 3 days. EXAM: ABDOMEN - 2 VIEW COMPARISON:  None. FINDINGS: No free intra-abdominal air. Increased air throughout nondilated small and large bowel loops in a nonobstructive pattern. Small volume of colonic stool. No radiopaque calculi or abnormal soft tissue calcifications. Minor bibasilar atelectasis. Spinal fusion hardware in the lower thoracic spine is partially included. No acute osseous abnormalities. IMPRESSION: Increased air throughout nondilated small and large bowel loops in a nonobstructive pattern, suggesting generalized ileus.  Electronically Signed   By: Keith Rake M.D.   On: 10/06/2019 23:03    Cardiac Studies   Echo 08/13/19  showed EF 60-65%, normal wall motion, mild biatrial dilation, mild MR, mild pulmonary hypertension (RVSP 42 mmHg), IVC mildly dilated, no change since last study  Cardiac cath 03/2018  patent coronary arteries with very mild, luminal irregularities in the LAD and left Cx  Patient Profile     78 y.o. male with a past medical history significant for severe AS (s/p TAVR in 05/2018 at [facility]), permanent atrial fibrillation, HTN, HLD, nonobstructive CAD, known PFO, degenerative disc disease with chronic neck and back pain status post multiple surgeries, and prior CVA who is admitted to the hospital with complaints of shortness of breath noted to be in afib RVR.   Assessment & Plan    Afib RVR Paroxysmal afib new since 2019 post TAVR procedure. Previously rate controlled on Metoprolol 100 mg daily. Patient was brought in yesterday for dyspnea on exertion and dizziness noted to be in afib RVR, rates in the 150s. Started on IV dilt.  - Patient is still in afib with rates 100-120. - Patient was transitioned to PO Cardizem 30 mg TID and Toprol -XL 25 mg BID for rate control. Will change to Lopressor 50 mg BID  - per care everywhere Echo 08/13/19 showed EF 60-65%, normal wall  motion, mild biatrial dilation, mild MR, mild pulmonary hypertension (RVSP 42 mmHg), IVC mildly dilated, no change since last study - Continue to monitor on telemetry - Continue Eliquis 5 mg - Potassium 3.9 >> will suplement. Mag 2.0 - TSH 3.30 - Echo with mild biatrial dilation. Once patient is adequately anticoagulated can consider cardioversion if patient remains in afib  Nonobstructive CAD per cath 03/2018 (per care everywhere) cardiac cath 03/2018 showed patent coronary arteries with very mild, luminal irregularities in the LAD and left Cx - Denies chest pain - EKG with no ischemic changes - Continue ASA,  statin, BB  Abdominal pain - patient had abdominal pain on presentation. CXR suggestive of generalized ileus - Bowel rest with hydration - CT abdomen/perlvis showed no acute abnormality - Pain is better today - per IM  HTN - continue BB - BP stable  HLD - simvastatin 40 mg at baseline - No recent LDL in records  Severe AS s/p [facility]AVR 05/2018 at [facility] - Echo 07/2019 showed no evidence of paravalvular regurgitation or dysfunction  H/o of stroke -  continue aspirin/Eliquis  For questions or updates, please contact [facility] Please consult [email or web address] for contact info under     Signed, Cadence Ninfa Meeker, PA-C  10/07/2019, 8:52 AM    The patient was seen, examined and discussed with Cadence Ninfa Meeker, PA-C   and I agree with the above.   The patient remains in atrial fibrillation with borderline controlled ventricular rates around 100, I would metoprolol 25 mg p.o. twice daily and switch Cardizem to Cardizem CD 120 mg daily.  We will observe overnight he can be discharged tomorrow.  Continue Eliquis for anticoagulation.  Ena Dawley, MD 10/07/2019

## 2019-10-07 NOTE — Progress Notes (Signed)
Pt ambulated with nurse aid and HR increased into 140's. Cyndia Skeeters, MD made aware. Will continue to monitor.

## 2019-10-07 NOTE — Progress Notes (Signed)
Patient refused to drink the contrast offered to him due to pain. Patient  NPO after MN so I verified with Dr. Paticia Stack   And he  okay for patient to drink the  Contrast. Patient now agreed to drink since his pain is under control. Will continue to monitor.

## 2019-10-07 NOTE — Progress Notes (Signed)
Patient crying for severe pain of 10/10 on  A pain scale. Paged Baltazar Najjar NP x 2 and has not responded to the page. Patient is on Oxy for years per his statement. Will page again and continue to monitor.

## 2019-10-07 NOTE — Progress Notes (Signed)
[name] notified and she indicated to leave the Cardizem drip off till HR sustained again above 110. NS at 100 mL/hr. SBP now 114/65, HR=103

## 2019-10-08 LAB — BASIC METABOLIC PANEL
Anion gap: 9 (ref 5–15)
BUN: 11 mg/dL (ref 8–23)
CO2: 24 mmol/L (ref 22–32)
Calcium: 9.2 mg/dL (ref 8.9–10.3)
Chloride: 103 mmol/L (ref 98–111)
Creatinine, Ser: 0.91 mg/dL (ref 0.61–1.24)
GFR calc Af Amer: >60 mL/min (ref >60)
GFR calc non Af Amer: >60 mL/min (ref >60)
Glucose, Bld: 89 mg/dL (ref 70–99)
Potassium: 3.9 mmol/L (ref 3.5–5.1)
Sodium: 136 mmol/L (ref 135–145)

## 2019-10-08 LAB — CBC
HCT: 34.6 % — ABNORMAL LOW (ref 39.0–52.0)
Hemoglobin: 11 g/dL — ABNORMAL LOW (ref 13.0–17.0)
MCH: 26.5 pg (ref 26.0–34.0)
MCHC: 31.8 g/dL (ref 30.0–36.0)
MCV: 83.4 fL (ref 80.0–100.0)
Platelets: 379 10*3/uL (ref 150–400)
RBC: 4.15 MIL/uL — ABNORMAL LOW (ref 4.22–5.81)
RDW: 18.5 % — ABNORMAL HIGH (ref 11.5–15.5)
WBC: 5.2 10*3/uL (ref 4.0–10.5)
nRBC: 0 % (ref 0.0–0.2)

## 2019-10-08 LAB — MAGNESIUM: Magnesium: 1.9 mg/dL (ref 1.7–2.4)

## 2019-10-08 MED ORDER — ENSURE ENLIVE PO LIQD: 237.0000 mL | Freq: Two times a day (BID) | ORAL | 12 refills | Status: DC

## 2019-10-08 MED ORDER — METOPROLOL TARTRATE 50 MG PO TABS: 50.0000 mg | ORAL_TABLET | Freq: Two times a day (BID) | ORAL | Status: DC

## 2019-10-08 MED ORDER — ENSURE ENLIVE PO LIQD: 237.0000 mL | Freq: Two times a day (BID) | ORAL | Status: DC

## 2019-10-08 MED ORDER — JUVEN PO PACK: 1.0000 | PACK | Freq: Two times a day (BID) | ORAL | 0 refills | Status: DC

## 2019-10-08 MED ORDER — METOPROLOL TARTRATE 50 MG PO TABS: 50.0000 mg | ORAL_TABLET | Freq: Two times a day (BID) | ORAL | 1 refills | Status: DC

## 2019-10-08 MED ORDER — DILTIAZEM HCL ER COATED BEADS 120 MG PO CP24: 120.0000 mg | ORAL_CAPSULE | Freq: Every day | ORAL | 1 refills | Status: DC

## 2019-10-08 MED ORDER — JUVEN PO PACK: 1.0000 | PACK | Freq: Two times a day (BID) | ORAL | Status: DC

## 2019-10-08 NOTE — Progress Notes (Signed)
Pt having multiple pauses. Longest measured at 2.96 secs. Pt aystomatic. Notified Cards fellow. Will continue to monitor.

## 2019-10-08 NOTE — Discharge Instructions (Signed)
Atrial Fibrillation ° °Atrial fibrillation is a type of heartbeat that is irregular or fast (rapid). If you have this condition, your heart beats without any order. This makes it hard for your heart to pump blood in a normal way. Having this condition gives you more risk for stroke, heart failure, and other heart problems. °Atrial fibrillation may start all of a sudden and then stop on its own, or it may become a long-lasting problem. °What are the causes? °This condition may be caused by heart conditions, such as: °· High blood pressure. °· Heart failure. °· Heart valve disease. °· Heart surgery. °Other causes include: °· Pneumonia. °· Obstructive sleep apnea. °· Lung cancer. °· Thyroid disease. °· Drinking too much alcohol. °Sometimes the cause is not known. °What increases the risk? °You are more likely to develop this condition if: °· You smoke. °· You are older. °· You have diabetes. °· You are overweight. °· You have a family history of this condition. °· You exercise often and hard. °What are the signs or symptoms? °Common symptoms of this condition include: °· A feeling like your heart is beating very fast. °· Chest pain. °· Feeling short of breath. °· Feeling light-headed or weak. °· Getting tired easily. °Follow these instructions at home: °Medicines °· Take over-the-counter and prescription medicines only as told by your doctor. °· If your doctor gives you a blood-thinning medicine, take it exactly as told. Taking too much of it can cause bleeding. Taking too little of it does not protect you against clots. Clots can cause a stroke. °Lifestyle ° °  ° °· Do not use any tobacco products. These include cigarettes, chewing tobacco, and e-cigarettes. If you need help quitting, ask your doctor. °· Do not drink alcohol. °· Do not drink beverages that have caffeine. These include coffee, soda, and tea. °· Follow diet instructions as told by your doctor. °· Exercise regularly as told by your doctor. °General  instructions °· If you have a condition that causes breathing to stop for a short period of time (apnea), treat it as told by your doctor. °· Keep a healthy weight. Do not use diet pills unless your doctor says they are safe for you. Diet pills may make heart problems worse. °· Keep all follow-up visits as told by your doctor. This is important. °Contact a doctor if: °· You notice a change in the speed, rhythm, or strength of your heartbeat. °· You are taking a blood-thinning medicine and you see more bruising. °· You get tired more easily when you move or exercise. °· You have a sudden change in weight. °Get help right away if: ° °· You have pain in your chest or your belly (abdomen). °· You have trouble breathing. °· You have blood in your vomit, poop, or pee (urine). °· You have any signs of a stroke. "BE FAST" is an easy way to remember the main warning signs: °? B - Balance. Signs are dizziness, sudden trouble walking, or loss of balance. °? E - Eyes. Signs are trouble seeing or a change in how you see. °? F - Face. Signs are sudden weakness or loss of feeling in the face, or the face or eyelid drooping on one side. °? A - Arms. Signs are weakness or loss of feeling in an arm. This happens suddenly and usually on one side of the body. °? S - Speech. Signs are sudden trouble speaking, slurred speech, or trouble understanding what people say. °? T - Time.   Time to call emergency services. Write down what time symptoms started.  You have other signs of a stroke, such as: ? A sudden, very bad headache with no known cause. ? Feeling sick to your stomach (nausea). ? Throwing up (vomiting). ? Jerky movements you cannot control (seizure). These symptoms may be an emergency. Do not wait to see if the symptoms will go away. Get medical help right away. Call your local emergency services (911 in the [place] Do not drive yourself to the hospital. Summary  Atrial fibrillation is a type of heartbeat that is irregular  or fast (rapid).  You are at higher risk of this condition if you smoke, are older, have diabetes, or are overweight.  Follow your doctor's instructions about medicines, diet, exercise, and follow-up visits.  Get help right away if you think that you have signs of a stroke. This information is not intended to replace advice given to you by your health care provider. Make sure you discuss any questions you have with your health care provider. Document Released: 09/11/2008 Document Revised: 02/06/2018 Document Reviewed: 01/24/2018 Elsevier Patient Education  2020 [employer] Heart-healthy meal planning includes:  Eating less unhealthy fats.  Eating more healthy fats.  Making other changes in your diet. Talk with your doctor or a diet specialist (dietitian) to create an eating plan that is right for you. What is my plan? Your doctor may recommend an eating plan that includes:  Total fat: ______% or less of total calories a day.  Saturated fat: ______% or less of total calories a day.  Cholesterol: less than _________mg a day. What are tips for following this plan? Cooking Avoid frying your food. Try to bake, boil, grill, or broil it instead. You can also reduce fat by:  Removing the skin from poultry.  Removing all visible fats from meats.  Steaming vegetables in water or broth. Meal planning   At meals, divide your plate into four equal parts: ? Fill one-half of your plate with vegetables and green salads. ? Fill one-fourth of your plate with whole grains. ? Fill one-fourth of your plate with lean protein foods.  Eat 4-5 servings of vegetables per day. A serving of vegetables is: ? 1 cup of raw or cooked vegetables. ? 2 cups of raw leafy greens.  Eat 4-5 servings of fruit per day. A serving of fruit is: ? 1 medium whole fruit. ?  cup of dried fruit. ?  cup of fresh, frozen, or canned fruit. ?  cup of 100% fruit juice.  Eat more  foods that have soluble fiber. These are apples, broccoli, carrots, beans, peas, and barley. Try to get 20-30 g of fiber per day.  Eat 4-5 servings of nuts, legumes, and seeds per week: ? 1 serving of dried beans or legumes equals  cup after being cooked. ? 1 serving of nuts is  cup. ? 1 serving of seeds equals 1 tablespoon. General information  Eat more home-cooked food. Eat less restaurant, buffet, and fast food.  Limit or avoid alcohol.  Limit foods that are high in starch and sugar.  Avoid fried foods.  Lose weight if you are overweight.  Keep track of how much salt (sodium) you eat. This is important if you have high blood pressure. Ask your doctor to tell you more about this.  Try to add vegetarian meals each week. Fats  Choose healthy fats. These include olive oil and canola oil, flaxseeds, walnuts, almonds, and seeds.  Eat more omega-3 fats. These include salmon, mackerel, sardines, tuna, flaxseed oil, and ground flaxseeds. Try to eat fish at least 2 times each week.  Check food labels. Avoid foods with trans fats or high amounts of saturated fat.  Limit saturated fats. ? These are often found in animal products, such as meats, butter, and cream. ? These are also found in plant foods, such as palm oil, palm kernel oil, and coconut oil.  Avoid foods with partially hydrogenated oils in them. These have trans fats. Examples are stick margarine, some tub margarines, cookies, crackers, and other baked goods. What foods can I eat? Fruits All fresh, canned (in natural juice), or frozen fruits. Vegetables Fresh or frozen vegetables (raw, steamed, roasted, or grilled). Green salads. Grains Most grains. Choose whole wheat and whole grains most of the time. Rice and pasta, including brown rice and pastas made with whole wheat. Meats and other proteins Lean, well-trimmed beef, veal, pork, and lamb. Chicken and [place] without skin. All fish and shellfish. Wild duck, rabbit,  pheasant, and venison. Egg whites or low-cholesterol egg substitutes. Dried beans, peas, lentils, and tofu. Seeds and most nuts. Dairy Low-fat or nonfat cheeses, including ricotta and mozzarella. Skim or 1% milk that is liquid, powdered, or evaporated. Buttermilk that is made with low-fat milk. Nonfat or low-fat yogurt. Fats and oils Non-hydrogenated (trans-free) margarines. Vegetable oils, including soybean, sesame, sunflower, olive, peanut, safflower, corn, canola, and cottonseed. Salad dressings or mayonnaise made with a vegetable oil. Beverages Mineral water. Coffee and tea. Diet carbonated beverages. Sweets and desserts Sherbet, gelatin, and fruit ice. Small amounts of dark chocolate. Limit all sweets and desserts. Seasonings and condiments All seasonings and condiments. The items listed above may not be a complete list of foods and drinks you can eat. Contact a dietitian for more options. What foods should I avoid? Fruits Canned fruit in heavy syrup. Fruit in cream or butter sauce. Fried fruit. Limit coconut. Vegetables Vegetables cooked in cheese, cream, or butter sauce. Fried vegetables. Grains Breads that are made with saturated or trans fats, oils, or whole milk. Croissants. Sweet rolls. Donuts. High-fat crackers, such as cheese crackers. Meats and other proteins Fatty meats, such as hot dogs, ribs, sausage, bacon, rib-eye roast or steak. High-fat deli meats, such as salami and bologna. Caviar. Domestic duck and goose. Organ meats, such as liver. Dairy Cream, sour cream, cream cheese, and creamed cottage cheese. Whole-milk cheeses. Whole or 2% milk that is liquid, evaporated, or condensed. Whole buttermilk. Cream sauce or high-fat cheese sauce. Yogurt that is made from whole milk. Fats and oils Meat fat, or shortening. Cocoa butter, hydrogenated oils, palm oil, coconut oil, palm kernel oil. Solid fats and shortenings, including bacon fat, salt pork, lard, and butter. Nondairy cream  substitutes. Salad dressings with cheese or sour cream. Beverages Regular sodas and juice drinks with added sugar. Sweets and desserts Frosting. Pudding. Cookies. Cakes. Pies. Milk chocolate or white chocolate. Buttered syrups. Full-fat ice cream or ice cream drinks. The items listed above may not be a complete list of foods and drinks to avoid. Contact a dietitian for more information. Summary  Heart-healthy meal planning includes eating less unhealthy fats, eating more healthy fats, and making other changes in your diet.  Eat a balanced diet. This includes fruits and vegetables, low-fat or nonfat dairy, lean protein, nuts and legumes, whole grains, and heart-healthy oils and fats. This information is not intended to replace advice given to you by your health care provider. Make sure you  discuss any questions you have with your health care provider. Document Released: 06/03/2012 Document Revised: 02/06/2018 Document Reviewed: 01/10/2018 Elsevier Patient Education  2020 [employer].

## 2019-10-08 NOTE — Progress Notes (Signed)
Initial Nutrition Assessment  DOCUMENTATION CODES:   Obesity unspecified  INTERVENTION:   -Ensure Enlive po BID, each supplement provides 350 kcal and 20 grams of protein -Juven Fruit Punch BID, each serving provides 95kcal and 2.5g of protein (amino acids glutamine and arginine)  NUTRITION DIAGNOSIS:   Increased nutrient needs related to wound healing as evidenced by estimated needs.  GOAL:   Patient will meet greater than or equal to 90% of their needs  MONITOR:   PO intake, Supplement acceptance, Labs, Weight trends, I & O's, Skin  REASON FOR ASSESSMENT:   Malnutrition Screening Tool    ASSESSMENT:   78 year old male with history of permanent A. fib on Eliquis, OA/chronic pain syndrome on opiates, depression, PFO, AS/TAVR, CVA and HTN brought to ED from pain clinic due to A. fib with RVR  **RD working [employer] just advanced to soft diet today. Was on clear liquids until this morning d/t abdominal pain. Pt tolerated clears, will monitor for PO intakes of meal trays. Pt with increased needs given stage 3 pressure injury, will order Juven and Ensure supplements to aid in wound healing.   Per weight records in care everywhere, pt has lost 27 lbs since 10/6 (10% wt loss x 2 weeks, significant for time frame).  I/Os: +1.7L since admit UOP: 850 ml x 24 hrs  Medications reviewed. Labs reviewed.  NUTRITION - FOCUSED PHYSICAL EXAM:  Unable to perform- working remotely.   Diet Order:   Diet Order            DIET SOFT Room service appropriate? Yes; Fluid consistency: Thin  Diet effective now              EDUCATION NEEDS:   No education needs have been identified at this time  Skin:  Skin Assessment: Skin Integrity Issues: Skin Integrity Issues:: Stage III Stage III: sacrum  Last BM:  10/21 - type 6  Height:   Ht Readings from Last 1 Encounters:  10/06/19 5\' 9"  (1.753 m)    Weight:   Wt Readings from Last 1 Encounters:  10/08/19  101.2 kg    Ideal Body Weight:  72.7 kg  BMI:  Body mass index is 32.96 kg/m.  Estimated Nutritional Needs:   Kcal:  2000-2200  Protein:  95-105g  Fluid:  2L/day  Clayton Bibles, MS, RD, LDN Inpatient Clinical Dietitian Pager: [phone] After Hours Pager: [phone]

## 2019-10-08 NOTE — TOC Transition Note (Signed)
Transition of Care [facility]) - CM/SW Discharge Note   Patient Details  Name: [name] MRN: [id number] Date of Birth: [birth year]  [facility]) CM/SW Contact:  Eileen Stanford, LCSW Phone Number: 10/08/2019, 3:52 PM   Clinical Narrative:  Pt is set up with [place] PT and RN. Pt is stable to d/c per CSW. RN updated.     Final next level of care: [place] Barriers to Discharge: No Barriers Identified   Patient Goals and CMS Choice Patient states their goals for this hospitalization and ongoing recovery are:: "to go home" CMS Medicare.gov Compare Post Acute Care list provided to:: Patient Choice offered to / list presented to : Patient  Discharge Placement              Patient chooses bed at: (pt will return home)   Name of family member notified: [name] Patient and family notified of of transfer: 10/08/19  Discharge Plan and Services     Post Acute Care Choice: Home Health                    HH Arranged: PT, RN [facility] Agency: [place] ([place]) Date HH Agency Contacted: 10/08/19 Time HH Agency Contacted: [phone]    Social Determinants of Health (SDOH) Interventions     Readmission Risk Interventions No flowsheet data found.

## 2019-10-08 NOTE — Progress Notes (Signed)
RN picked up home medications from pharmacy and cell phone and wallet from security. Pt agree he has all of his belongings.

## 2019-10-08 NOTE — TOC Initial Note (Signed)
Transition of Care [facility]) - Initial/Assessment Note    Patient Details  Name: [name] MRN: [id number] Date of Birth: January 31, 1941  [facility]) CM/SW Contact:    Eileen Stanford, LCSW Phone Number: 10/08/2019, 3:49 PM  Clinical Narrative:   Pt lives with his sister. Pt is agreeable to [facility]. Pt states "just find me one that works with [facility]." I explained [place] would be through insurance and pt was ok with me searching for who would take him on my list. [place] will take pt.              Expected Discharge Plan: [place] Barriers to Discharge: No Barriers Identified   Patient Goals and CMS Choice Patient states their goals for this hospitalization and ongoing recovery are:: "to go home" CMS Medicare.gov Compare Post Acute Care list provided to:: Patient Choice offered to / list presented to : Patient  Expected Discharge Plan and Services Expected Discharge Plan: [place] Choice: [place] arrangements for the past 2 months: Single Family Home Expected Discharge Date: 10/08/19                         HH Arranged: PT, RN [place] Agency: [place] ([place]) Date [place]: 10/08/19 Time HH Agency Contacted: [phone]    Prior Living Arrangements/Services Living arrangements for the past 2 months: [place] Lives with:: Siblings Patient language and need for interpreter reviewed:: Yes Do you feel safe going back to the place where you live?: Yes      Need for Family Participation in Patient Care: Yes (Comment) Care giver support system in place?: Yes (comment)   Criminal Activity/Legal Involvement Pertinent to Current Situation/Hospitalization: No - Comment as needed  Activities of Daily Living Home Assistive Devices/Equipment: Blood pressure cuff, Cane (specify quad or straight) ADL Screening (condition at time of admission) Patient's cognitive ability adequate  to safely complete daily activities?: Yes Is the patient deaf or have difficulty hearing?: Yes Does the patient have difficulty seeing, even when wearing glasses/contacts?: No Does the patient have difficulty concentrating, remembering, or making decisions?: No Patient able to express need for assistance with ADLs?: Yes Does the patient have difficulty dressing or bathing?: No Independently performs ADLs?: Yes (appropriate for developmental age) Does the patient have difficulty walking or climbing stairs?: Yes Weakness of Legs: None Weakness of Arms/Hands: Left  Permission Sought/Granted Permission sought to share information with : Family Supports    Share Information with NAME: [name]  Permission granted to share info w AGENCY: [facility]  Permission granted to share info w Relationship: Sister     Emotional Assessment Appearance:: Appears stated age Attitude/Demeanor/Rapport: Engaged Affect (typically observed): Accepting, Appropriate Orientation: : Oriented to  Time, Oriented to Situation, Oriented to Place, Oriented to Self Alcohol / Substance Use: Not Applicable Psych Involvement: No (comment)  Admission diagnosis:  Atrial fibrillation with rapid ventricular response (HCC) [I48.91] Abdominal pain [R10.9] Patient Active Problem List   Diagnosis Date Noted  . Abdominal pain 10/07/2019  . Pressure injury of skin 10/07/2019  . Atrial fibrillation with rapid ventricular response ([place]) 10/06/2019  . Acute encephalopathy   . GI bleeding 10/13/2018  . Acute metabolic encephalopathy [id number]  . Acute renal failure (ARF) ([place]) 10/13/2018  . S/P TAVR (transcatheter aortic valve replacement) 09/20/2018  . Permanent atrial fibrillation ([place]) 09/20/2018  . Acute on  chronic diastolic CHF (congestive heart failure) ([place]) 09/18/2018  . Heme positive stool 11/20/2017  . Gait disorder 03/24/2013  . Cervical post-laminectomy syndrome 03/26/2012  . Depression 03/26/2012  . Degenerative  arthritis of shoulder region 03/26/2012  . KNEE REPLACEMENT, BILATERAL, HX OF 06/04/2007  . CELLULITIS/ABSCESS, ARM 05/05/2007  . ANXIETY 04/08/2007  . PAIN, CHRONIC NEC 04/08/2007  . OSTEOARTHROSIS, GENERALIZED, MULTIPLE SITES 04/08/2007  . SPONDYLOLISTHESIS 04/08/2007  . INCONTINENCE, URGE 04/08/2007  . HYPERCHOLESTEROLEMIA 03/07/2007  . HYPERTENSION, BENIGN 03/07/2007   PCP:  Hayden Rasmussen, MD Pharmacy:   [id number] - [place], [place] [address] Phone: [phone] Fax: [phone]     Social Determinants of Health (SDOH) Interventions    Readmission Risk Interventions No flowsheet data found.

## 2019-10-08 NOTE — Progress Notes (Addendum)
Progress Note  Patient Name: [name] Date of Encounter: 10/08/2019  Primary Cardiologist: Dr. Duke Salvia at [facility]  Subjective   Patient is complaining of some back this morning. Abdominal pain has improved. Denies chest pain or shortness of breath.  Inpatient Medications    Scheduled Meds:  apixaban  5 mg Oral BID   diltiazem  120 mg Oral Daily   metoprolol tartrate  25 mg Oral BID   Continuous Infusions:  PRN Meds: acetaminophen, ondansetron (ZOFRAN) IV, oxyCODONE-acetaminophen, polyethylene glycol, senna-docusate   Vital Signs    Vitals:   10/08/19 0230 10/08/19 0500 10/08/19 0523 10/08/19 0830  BP:   (!) 112/58 126/82  Pulse: 71  79 84  Resp: 13  15 14   Temp:   98.1 F (36.7 C)   TempSrc:   Oral   SpO2: 98%  99% 99%  Weight:  101.2 kg    Height:        Intake/Output Summary (Last 24 hours) at 10/08/2019 0835 Last data filed at 10/07/2019 2030 Gross per 24 hour  Intake 1229 ml  Output 850 ml  Net 379 ml   Last 3 Weights 10/08/2019 10/06/2019 09/20/2018  Weight (lbs) 223 lb 3.2 oz 223 lb 8.7 oz 257 lb 11.2 oz  Weight (kg) 101.243 kg 101.4 kg 116.892 kg      Telemetry    NSR, HR 80-90, pauses overnight, longest 2.96 seconds  - Personally Reviewed  ECG    No new - Personally Reviewed  Physical Exam   GEN: No acute distress.   Neck: No JVD Cardiac: RRR, no murmurs, rubs, or gallops.  Respiratory: Clear to auscultation bilaterally. GI: Soft, nontender, non-distended  MS: No edema; No deformity. Neuro:  Nonfocal  Psych: Normal affect   Labs    High Sensitivity Troponin:  No results for input(s): TROPONINIHS in the last 720 hours.    Chemistry Recent Labs  Lab 10/06/19 1847 10/07/19 0733 10/08/19 0344  NA 137 138 136  K 3.9 3.9 3.9  CL 100 104 103  CO2 23 22 24   GLUCOSE 86 82 89  BUN 14 15 11   CREATININE 1.25* 0.91 0.91  CALCIUM 9.6 9.0 9.2  PROT 8.2*  --   --   ALBUMIN 3.6  --   --   AST 20  --   --   ALT 15  --   --     ALKPHOS 96  --   --   BILITOT 0.6  --   --   GFRNONAA 55* >60 >60  GFRAA >60 >60 >60  ANIONGAP 14 12 9      Hematology Recent Labs  Lab 10/06/19 1831 10/07/19 0733 10/08/19 0344  WBC 8.2 7.6 5.2  RBC 4.46 4.04* 4.15*  HGB 11.8* 10.8* 11.0*  HCT 38.1* 34.2* 34.6*  MCV 85.4 84.7 83.4  MCH 26.5 26.7 26.5  MCHC 31.0 31.6 31.8  RDW 18.6* 18.4* 18.5*  PLT 358 326 379    BNPNo results for input(s): BNP, PROBNP in the last 168 hours.   DDimer No results for input(s): DDIMER in the last 168 hours.   Radiology    Ct Abdomen Pelvis Wo Contrast  Result Date: 10/07/2019 CLINICAL DATA:  Generalized acute abdominal pain. EXAM: CT ABDOMEN AND PELVIS WITHOUT CONTRAST TECHNIQUE: Multidetector CT imaging of the abdomen and pelvis was performed following the standard protocol without IV contrast. COMPARISON:  04/25/2010 FINDINGS: Lower chest: Motion artifact. Suggestion of basilar emphysema. Mitral annulus calcifications. Heart is normal in size.  Hepatobiliary: No focal liver abnormality is seen. No gallstones, gallbladder wall thickening, or biliary dilatation. Pancreas: Mild parenchymal atrophy. No ductal dilatation or inflammation. Spleen: Normal in size without focal abnormality. Adrenals/Urinary Tract: No adrenal nodule. Nonobstructing stone in the upper right kidney. No hydronephrosis. Mild symmetric perinephric edema is nonspecific. Tiny low-density lesion in the lower spleen is too small to accurately characterize. Ureters are decompressed. Urinary bladder physiologically distended. No bladder wall thickening. Stomach/Bowel: Small hiatal hernia. Gastric suture line. No gastric wall thickening. No small bowel obstruction or inflammatory change. Administered enteric contrast reaches the colon. Normal appendix. No colonic wall thickening. Vascular/Lymphatic: Aortic atherosclerosis and tortuosity. No adenopathy. Reproductive: Prostate is unremarkable. Other: Fat containing supraumbilical ventral  abdominal wall hernia. Tiny fat containing umbilical hernia. Fat in both inguinal canals. No ascites. No free air. Musculoskeletal: Spinal fusion hardware in the lower thoracic spine is partially included. Multilevel degenerative change in the lumbar spine. There are no acute or suspicious osseous abnormalities. IMPRESSION: 1. No acute abnormality or explanation for abdominal pain. 2. Nonobstructing right renal stone. 3. Supraumbilical hernia containing fat and mesenteric vessels. Tiny fat containing umbilical hernia. Aortic Atherosclerosis (ICD10-I70.0). Electronically Signed   By: Keith Rake M.D.   On: 10/07/2019 05:20   Dg Chest Port 1 View  Result Date: 10/06/2019 CLINICAL DATA:  Chest pain, tachycardia, atrial fibrillation with rapid ventricular response EXAM: PORTABLE CHEST 1 VIEW COMPARISON:  CT 01/31/2011 FINDINGS: Coarse basilar interstitial changes are similar to prior. Basilar areas of atelectasis. No consolidation, features of edema, pneumothorax, or effusion. Mild cardiomegaly, stable to comparison exams. Atherosclerotic calcification of the aortic arch. Cervical and thoracic fusion hardware is present. Postsurgical changes from right rotator cuff repair. Severe arthrosis noted in both shoulders. Multilevel degenerative changes present in the spine. IMPRESSION: Unchanged coarse basilar interstitial changes. Streaky areas of basilar atelectasis. No other acute cardiopulmonary abnormality. Electronically Signed   By: Lovena Le M.D.   On: 10/06/2019 19:17   Dg Abd 2 Views  Result Date: 10/06/2019 CLINICAL DATA:  Abdominal pain and bloating for 3 days. EXAM: ABDOMEN - 2 VIEW COMPARISON:  None. FINDINGS: No free intra-abdominal air. Increased air throughout nondilated small and large bowel loops in a nonobstructive pattern. Small volume of colonic stool. No radiopaque calculi or abnormal soft tissue calcifications. Minor bibasilar atelectasis. Spinal fusion hardware in the lower thoracic  spine is partially included. No acute osseous abnormalities. IMPRESSION: Increased air throughout nondilated small and large bowel loops in a nonobstructive pattern, suggesting generalized ileus. Electronically Signed   By: Keith Rake M.D.   On: 10/06/2019 23:03    Cardiac Studies   Echo 08/13/19 (care everywhere) showed EF 60-65%, normal wall motion, mild biatrial dilation, mild MR, mild pulmonary hypertension (RVSP 42 mmHg), IVC mildly dilated, no change since last study  Cardiac cath 03/2018 (care everywhere)  patent coronary arteries with very mild, luminal irregularities in the LAD and left Cx  Patient Profile     78 y.o. male with a past medical history significant for severe AS (s/p TAVR in 05/2018 at [facility] atrial fibrillation, HTN, HLD, nonobstructive CAD, known PFO, degenerative disc disease with chronic neck and back pain status post multiple surgeries, and prior CVA who is admitted to the hospital with complaints of shortness of breath noted to be in afib RVR.   Assessment & Plan    Afib  Paroxysmal afib new since 2019 post TAVR procedure. Previously rate controlled on Metoprolol 100 mg daily. Patient was brought in yesterday for  dyspnea on exertion and dizziness noted to be in afib RVR, rates in the 150s. Started on IV dilt. Through outside records it is difficult to tell whether patient is permanent or paroxysmal afib.  - Patient was transitioned to PO Cardizem and Toprol. Rates currently 80-90. Continued cardizem 120 mg daily and toprol 25 mg for rate control - per care everywhere Echo 08/13/19 showed EF 60-65%, normal wall motion, mild biatrial dilation, mild MR, mild pulmonary hypertension (RVSP 42 mmHg), IVC mildly dilated, no change since last study - Continue Eliquis 5 mg - Potassium 3.9. Mag 2.0 - TSH 3.30 - Will continue to rate control at this time - Spoke to patient about making a hospital follow up with his cardiologist at [facility]  Nonobstructive  CAD per cath 03/2018 (per care everywhere) cardiac cath 03/2018 showed patent coronary arteries with very mild, luminal irregularities in the LAD and left Cx - Denies chest pain - EKG with no ischemic changes - Continue ASA, statin, BB  Abdominal pain - patient had abdominal pain on presentation. CXR suggestive of generalized ileus - CT abdomen/perlvis showed no acute abnormality - Pain improved - per IM  AKI - 1.25 on admission. Lasix and Lisinopril were held - Today creatinine 0.91  HTN - continue BB. Home meds include lasix 20 mg and lisinopril 10 mg.  - BP 126/82 this AM - Previous notes suggest h/o of hypotension. Since being started on new meds cardizem and metoprolol pressures have been stable. Can consider restarting meds at outpatient follow up with [facility].  HLD - simvastatin 40 mg at baseline - No recent LDL in records  Severe AS s/p TAVR 05/2018 at [facility] - Echo 07/2019 showed no evidence of paravalvular regurgitation or dysfunction  H/o of stroke -  continue aspirin/Eliquis  For questions or updates, please contact [place] Please consult [email or web address] for contact info under     Signed, Cadence Ninfa Meeker, PA-C  10/08/2019, 8:35 AM    The patient was seen, examined and discussed with Cadence Ninfa Meeker, PA-C   and I agree with the above.   The patient remains in a-fib, now with controlled ventricular rates, I would increase metoprolol to 50 mg PO BID> We will sign off , please call us with any questions.   Ena Dawley, MD 10/08/2019

## 2019-10-08 NOTE — Progress Notes (Addendum)
Physical Therapy Treatment Patient Details Name: [name] MRN: [id number] DOB: 1941/06/10 Today's Date: 10/08/2019    History of Present Illness Pt is a 78 y/o male admitted from pain clinic secondary to a-fib with RVR. PMH including but not limited to permanent atrial fibrillation, chronic pain syndrome, depression, PFO, severe aortic stenosis status post TAVR, stroke, hypertension.    PT Comments    Patient seen for mobility progression. Pt is making progress toward PT goals and tolerated increased activity well. HR 118 bpm at highest with mobility and SpO2 99% on RA. Current plan remains appropriate.    Follow Up Recommendations  Home health PT;Supervision for mobility/OOB     Equipment Recommendations  None recommended by PT    Recommendations for Other Services       Precautions / Restrictions Precautions Precautions: Fall Precaution Comments: monitor HR Restrictions Weight Bearing Restrictions: No    Mobility  Bed Mobility Overal bed mobility: Needs Assistance Bed Mobility: Sit to Supine;Supine to Sit     Supine to sit: Min guard Sit to supine: Supervision   General bed mobility comments: use of rail and increased time/effor to elevate trunk into sitting from flat bed  Transfers Overall transfer level: Needs assistance Equipment used: Straight cane Transfers: Sit to/from Stand Sit to Stand: Supervision         General transfer comment: supervision for safety  Ambulation/Gait Ambulation/Gait assistance: Supervision Gait Distance (Feet): 200 Feet Assistive device: Straight cane Gait Pattern/deviations: Step-through pattern;Decreased step length - right;Decreased step length - left;Decreased stride length Gait velocity: decreased   General Gait Details: slow and cautious movements but overall steady gait    Stairs             Wheelchair Mobility    Modified Rankin (Stroke Patients Only)       Balance Overall balance assessment:  Needs assistance Sitting-balance support: Feet supported Sitting balance-Leahy Scale: Good     Standing balance support: During functional activity;No upper extremity supported Standing balance-Leahy Scale: Fair                              Cognition Arousal/Alertness: Awake/alert Behavior During Therapy: WFL for tasks assessed/performed Overall Cognitive Status: Within Functional Limits for tasks assessed                                        Exercises      General Comments General comments (skin integrity, edema, etc.): HR 84-118 bpm during session with HR staying in 90s majority of the time      Pertinent Vitals/Pain Pain Assessment: No/denies pain    Home Living                      Prior Function            PT Goals (current goals can now be found in the care plan section) Progress towards PT goals: Progressing toward goals    Frequency    Min 3X/week      PT Plan Current plan remains appropriate    Co-evaluation              AM-PAC PT "6 Clicks" Mobility   Outcome Measure  Help needed turning from your back to your side while in a flat bed without using bedrails?: None Help needed moving from lying  on your back to sitting on the side of a flat bed without using bedrails?: None Help needed moving to and from a bed to a chair (including a wheelchair)?: A Little Help needed standing up from a chair using your arms (e.g., wheelchair or bedside chair)?: None Help needed to walk in hospital room?: A Little Help needed climbing 3-5 steps with a railing? : A Lot 6 Click Score: 20    End of Session   Activity Tolerance: Patient tolerated treatment well Patient left: in bed;with call bell/phone within reach Nurse Communication: Mobility status PT Visit Diagnosis: Other abnormalities of gait and mobility (R26.89)     Time: [id number] PT Time Calculation (min) (ACUTE ONLY): 19 min  Charges:  $Gait Training:  8-22 mins                     Earney Navy, PTA Acute Rehabilitation Services Pager: [phone] Office: [phone]     Darliss Cheney 10/08/2019, 1:58 PM

## 2019-10-09 NOTE — Discharge Summary (Signed)
Physician Discharge Summary  [name] [id number] DOB: July 28, 1941 DOA: 10/06/2019  PCP: Hayden Rasmussen, MD  Admit date: 10/06/2019 Discharge date: 10/08/2019  Admitted From: Home.  Disposition:  Home.   Recommendations for Outpatient Follow-up:  1. Follow up with PCP in 1-2 weeks 2. Please obtain BMP/CBC in one week Please follow up with cardiology in one week.     Discharge Condition:stable.  CODE STATUS: full code.  Diet recommendation: Heart Healthy   Brief/Interim Summary: 78 year old male with history of permanent A. fib on Eliquis, OA/chronic pain syndrome on opiates, depression, PFO, AS/TAVR, CVA and HTN brought to ED from pain clinic due to A. fib with RVR , he was started on cardizem drip , later on transitioned off the drip and started on cardizem 120 mg daily and increased metoprolol for better rate control.  Cardiology consulted and recommendations given.    Discharge Diagnoses:  Principal Problem:   Atrial fibrillation with rapid ventricular response (HCC) Active Problems:   Abdominal pain   Pressure injury of skin  A. fib with [id number] well controlled on discharge.   CHA2DS2-VASc score greater than 3. Cardiology consulted and initially he was started on cardizem drip later on transitioned to P.o. Cardizem CD 120 mg daily -Metoprolol 25 mg twice daily -Continue Eliquis for anticoagulation.   Abdominal pain: Unclear etiology.  CT abdomen and pelvis revealed nothing right renal stone, supraumbilical hernia without carcinoid but no other significant finding.  LBM yesterday.  Denies nausea/vomiting melena or hematochezia.  Abdominal exam benign. Advanced diet to soft without any issues. He reports his abd pain has resolved.   AKI: suspect prerenal etiology in the setting of A. fib with RVR.  He is also on Lasix and lisinopril which could contribute.  AKI resolved. Resume lasix and lisinopril on discharge.   Osteoarthritis/chronic pain: Follows at the  pain clinic.  Fillls Percocet 10/325 #75 every 15 days.  Last fill 08/18/2019 -Continue home Percocet with bowel regimen.  Essential hypertension: well controlled.   History of CVA: No apparent residual deficit. -Continue home medication  Anemia of chronic disease: Stable.  AS/TAVR: Stable.  Depression: Stable -Continue home meds  Pressure Injury 10/06/19 Sacrum Stage III -  Full thickness tissue loss. Subcutaneous fat may be visible but bone, tendon or muscle are NOT exposed. (Active)  10/06/19 2300  Location: Sacrum  Location Orientation:   Staging: Stage III -  Full thickness tissue loss. Subcutaneous fat may be visible but bone, tendon or muscle are NOT exposed.  Wound Description (Comments):   Present on Admission: Yes     Discharge Instructions  Discharge Instructions    Amb referral to [facility]IB Clinic   Complete by: As directed    Amb referral to AFIB Clinic   Complete by: As directed    Diet - low sodium heart healthy   Complete by: As directed    Discharge instructions   Complete by: As directed    Please follow up with cardiology in 1 week.     Allergies as of 10/08/2019      Reactions   Lorazepam Other (See Comments)   Delirium   Duloxetine Other (See Comments)   Increased depression, irritability   Pregabalin Swelling, Other (See Comments)   Flashbacks, Bad dreams, Aggressive behavior, swelling   Zanaflex [tizanidine Hcl] Anxiety      Medication List    STOP taking these medications   metoprolol succinate 25 MG 24 hr tablet Commonly known as: TOPROL-XL   omeprazole 20  MG capsule Commonly known as: PRILOSEC     TAKE these medications   apixaban 5 MG Tabs tablet Commonly known as: Eliquis Take 1 tablet (5 mg total) by mouth 2 (two) times daily.   ARIPiprazole 2 MG tablet Commonly known as: ABILIFY Take 2 mg by mouth daily.   ARTIFICIAL TEARS PF OP Place 1-2 drops into both eyes 3 (three) times daily as needed (for dryness).   aspirin  EC 81 MG tablet Take 81 mg by mouth daily.   buPROPion 100 MG tablet Commonly known as: WELLBUTRIN Take 100 mg by mouth 2 (two) times daily.   cyanocobalamin 1000 MCG/ML injection Commonly known as: (VITAMIN B-12) Inject 1,000 mcg into the muscle every 30 (thirty) days.   diltiazem 120 MG 24 hr capsule Commonly known as: CARDIZEM CD Take 1 capsule (120 mg total) by mouth daily.   feeding supplement (ENSURE ENLIVE) Liqd Take 237 mLs by mouth 2 (two) times daily between meals.   nutrition supplement (JUVEN) Pack Take 1 packet by mouth 2 (two) times daily with a meal.   fluticasone 50 MCG/ACT nasal spray Commonly known as: FLONASE Place 1 spray into both nostrils daily as needed for allergies or rhinitis.   furosemide 20 MG tablet Commonly known as: LASIX Take 2 tablets (40mg ) in AM and 1 tablet (20mg  in PM) What changed:   how much to take  how to take this  when to take this  additional instructions   gabapentin 600 MG tablet Commonly known as: NEURONTIN Take 600 mg by mouth 3 (three) times daily.   lisinopril 30 MG tablet Commonly known as: ZESTRIL Take 30 mg by mouth daily.   methocarbamol 500 MG tablet Commonly known as: ROBAXIN Take 500 mg by mouth 2 (two) times daily.   metoprolol tartrate 50 MG tablet Commonly known as: LOPRESSOR Take 1 tablet (50 mg total) by mouth 2 (two) times daily.   oxyCODONE-acetaminophen 10-325 MG tablet Commonly known as: PERCOCET Take 1 tablet by mouth 4 (four) times daily as needed for pain. What changed: when to take this   pantoprazole 40 MG tablet Commonly known as: Protonix Take 1 tablet (40 mg total) by mouth 2 (two) times daily.   polyethylene glycol 17 g packet Commonly known as: MIRALAX / GLYCOLAX Take 17 g by mouth daily.   potassium chloride SA 20 MEQ tablet Commonly known as: KLOR-CON Take 1 tablet (20 mEq total) by mouth daily. What changed: when to take this   QUEtiapine 25 MG tablet Commonly known  as: SEROQUEL Take 25 mg by mouth at bedtime.   rOPINIRole 0.25 MG tablet Commonly known as: REQUIP Take 0.25 mg by mouth 2 (two) times daily.   simvastatin 40 MG tablet Commonly known as: ZOCOR Take 40 mg by mouth at bedtime.   tamsulosin 0.4 MG Caps capsule Commonly known as: FLOMAX Take 0.4 mg by mouth daily.   topiramate 25 MG tablet Commonly known as: TOPAMAX Take 25 mg by mouth 2 (two) times daily.   Venelex Oint Apply 1 application topically 2 (two) times daily.      Follow-up Information    Hayden Rasmussen, MD. Schedule an appointment as soon as possible for a visit in 1 week(s).   Specialty: Family Medicine Contact information: [address] [place] [address] [phone]        [facility] Follow up.   Specialty: Home Health Services Why: physical therapy and nurse  Allergies  Allergen Reactions  . Lorazepam Other (See Comments)    Delirium   . Duloxetine Other (See Comments)    Increased depression, irritability  . Pregabalin Swelling and Other (See Comments)    Flashbacks, Bad dreams, Aggressive behavior, swelling  . Zanaflex [Tizanidine Hcl] Anxiety    Consultations:  cardiology   Procedures/Studies: Ct Abdomen Pelvis Wo Contrast  Result Date: 10/07/2019 CLINICAL DATA:  Generalized acute abdominal pain. EXAM: CT ABDOMEN AND PELVIS WITHOUT CONTRAST TECHNIQUE: Multidetector CT imaging of the abdomen and pelvis was performed following the standard protocol without IV contrast. COMPARISON:  04/25/2010 FINDINGS: Lower chest: Motion artifact. Suggestion of basilar emphysema. Mitral annulus calcifications. Heart is normal in size. Hepatobiliary: No focal liver abnormality is seen. No gallstones, gallbladder wall thickening, or biliary dilatation. Pancreas: Mild parenchymal atrophy. No ductal dilatation or inflammation. Spleen: Normal in size without focal abnormality. Adrenals/Urinary Tract: No adrenal  nodule. Nonobstructing stone in the upper right kidney. No hydronephrosis. Mild symmetric perinephric edema is nonspecific. Tiny low-density lesion in the lower spleen is too small to accurately characterize. Ureters are decompressed. Urinary bladder physiologically distended. No bladder wall thickening. Stomach/Bowel: Small hiatal hernia. Gastric suture line. No gastric wall thickening. No small bowel obstruction or inflammatory change. Administered enteric contrast reaches the colon. Normal appendix. No colonic wall thickening. Vascular/Lymphatic: Aortic atherosclerosis and tortuosity. No adenopathy. Reproductive: Prostate is unremarkable. Other: Fat containing supraumbilical ventral abdominal wall hernia. Tiny fat containing umbilical hernia. Fat in both inguinal canals. No ascites. No free air. Musculoskeletal: Spinal fusion hardware in the lower thoracic spine is partially included. Multilevel degenerative change in the lumbar spine. There are no acute or suspicious osseous abnormalities. IMPRESSION: 1. No acute abnormality or explanation for abdominal pain. 2. Nonobstructing right renal stone. 3. Supraumbilical hernia containing fat and mesenteric vessels. Tiny fat containing umbilical hernia. Aortic Atherosclerosis (ICD10-I70.0). Electronically Signed   By: Keith Rake M.D.   On: 10/07/2019 05:20   Dg Chest Port 1 View  Result Date: 10/06/2019 CLINICAL DATA:  Chest pain, tachycardia, atrial fibrillation with rapid ventricular response EXAM: PORTABLE CHEST 1 VIEW COMPARISON:  CT 01/31/2011 FINDINGS: Coarse basilar interstitial changes are similar to prior. Basilar areas of atelectasis. No consolidation, features of edema, pneumothorax, or effusion. Mild cardiomegaly, stable to comparison exams. Atherosclerotic calcification of the aortic arch. Cervical and thoracic fusion hardware is present. Postsurgical changes from right rotator cuff repair. Severe arthrosis noted in both shoulders. Multilevel  degenerative changes present in the spine. IMPRESSION: Unchanged coarse basilar interstitial changes. Streaky areas of basilar atelectasis. No other acute cardiopulmonary abnormality. Electronically Signed   By: Lovena Le M.D.   On: 10/06/2019 19:17   Dg Abd 2 Views  Result Date: 10/06/2019 CLINICAL DATA:  Abdominal pain and bloating for 3 days. EXAM: ABDOMEN - 2 VIEW COMPARISON:  None. FINDINGS: No free intra-abdominal air. Increased air throughout nondilated small and large bowel loops in a nonobstructive pattern. Small volume of colonic stool. No radiopaque calculi or abnormal soft tissue calcifications. Minor bibasilar atelectasis. Spinal fusion hardware in the lower thoracic spine is partially included. No acute osseous abnormalities. IMPRESSION: Increased air throughout nondilated small and large bowel loops in a nonobstructive pattern, suggesting generalized ileus. Electronically Signed   By: Keith Rake M.D.   On: 10/06/2019 23:03      Subjective: No new complaints.   Discharge Exam: Vitals:   10/08/19 1402 10/08/19 1605  BP: 125/78 (!) 142/87  Pulse: 86 100  Resp: 16 20  Temp: 98.6 F (  37 C)   SpO2: 97% 98%   Vitals:   10/08/19 0523 10/08/19 0830 10/08/19 1402 10/08/19 1605  BP: (!) 112/58 126/82 125/78 (!) 142/87  Pulse: 79 84 86 100  Resp: 15 14 16 20   Temp: 98.1 F (36.7 C)  98.6 F (37 C)   TempSrc: Oral  Oral   SpO2: 99% 99% 97% 98%  Weight:      Height:        General: Pt is alert, awake, not in acute distress Cardiovascular: RRR, S1/S2 +, no rubs, no gallops Respiratory: CTA bilaterally, no wheezing, no rhonchi Abdominal: Soft, NT, ND, bowel sounds + Extremities: no edema, no cyanosis    The results of significant diagnostics from this hospitalization (including imaging, microbiology, ancillary and laboratory) are listed below for reference.     Microbiology: Recent Results (from the past 240 hour(s))  SARS CORONAVIRUS 2 (TAT 6-24 HRS)  Nasopharyngeal Nasopharyngeal Swab     Status: None   Collection Time: 10/06/19  6:50 PM   Specimen: Nasopharyngeal Swab  Result Value Ref Range Status   SARS Coronavirus 2 NEGATIVE NEGATIVE Final    Comment: (NOTE) SARS-CoV-2 target nucleic acids are NOT DETECTED. The SARS-CoV-2 RNA is generally detectable in upper and lower respiratory specimens during the acute phase of infection. Negative results do not preclude SARS-CoV-2 infection, do not rule out co-infections with other pathogens, and should not be used as the sole basis for treatment or other patient management decisions. Negative results must be combined with clinical observations, patient history, and epidemiological information. The expected result is Negative. Fact Sheet for Patients: SugarRoll.be Fact Sheet for Healthcare Providers: https://www.woods-mathews.com/ This test is not yet approved or cleared by the [place] FDA and  has been authorized for detection and/or diagnosis of SARS-CoV-2 by FDA under an Emergency Use Authorization (EUA). This EUA will remain  in effect (meaning this test can be used) for the duration of the COVID-19 declaration under Section 56 4(b)(1) of the Act, 21 U.S.C. section 360bbb-3(b)(1), unless the authorization is terminated or revoked sooner. Performed at [facility], [place] [address] [place], [place] [address]      Labs: BNP (last 3 results) No results for input(s): BNP in the last 8760 hours. Basic Metabolic Panel: Recent Labs  Lab 10/06/19 1847 10/07/19 0733 10/08/19 0344  NA 137 138 136  K 3.9 3.9 3.9  CL 100 104 103  CO2 23 22 24   GLUCOSE 86 82 89  BUN 14 15 11   CREATININE 1.25* 0.91 0.91  CALCIUM 9.6 9.0 9.2  MG 1.9 2.0 1.9   Liver Function Tests: Recent Labs  Lab 10/06/19 1847  AST 20  ALT 15  ALKPHOS 96  BILITOT 0.6  PROT 8.2*  ALBUMIN 3.6   No results for input(s): LIPASE, AMYLASE in the last 168  hours. No results for input(s): AMMONIA in the last 168 hours. CBC: Recent Labs  Lab 10/06/19 1831 10/07/19 0733 10/08/19 0344  WBC 8.2 7.6 5.2  HGB 11.8* 10.8* 11.0*  HCT 38.1* 34.2* 34.6*  MCV 85.4 84.7 83.4  PLT 358 326 379   Cardiac Enzymes: No results for input(s): CKTOTAL, CKMB, CKMBINDEX, TROPONINI in the last 168 hours. BNP: Invalid input(s): POCBNP CBG: No results for input(s): GLUCAP in the last 168 hours. D-Dimer No results for input(s): DDIMER in the last 72 hours. Hgb A1c No results for input(s): HGBA1C in the last 72 hours. Lipid Profile No results for input(s): CHOL, HDL, LDLCALC, TRIG, CHOLHDL,  LDLDIRECT in the last 72 hours. Thyroid function studies Recent Labs    10/06/19 1917  TSH 3.303   Anemia work up No results for input(s): VITAMINB12, FOLATE, FERRITIN, TIBC, IRON, RETICCTPCT in the last 72 hours. Urinalysis    Component Value Date/Time   COLORURINE YELLOW 10/13/2018 1232   APPEARANCEUR HAZY (A) 10/13/2018 1232   LABSPEC 1.015 10/13/2018 1232   PHURINE 5.0 10/13/2018 1232   GLUCOSEU NEGATIVE 10/13/2018 1232   HGBUR NEGATIVE 10/13/2018 1232   HGBUR negative 04/08/2007 1327   BILIRUBINUR NEGATIVE 10/13/2018 1232   KETONESUR NEGATIVE 10/13/2018 1232   PROTEINUR NEGATIVE 10/13/2018 1232   UROBILINOGEN 0.2 04/24/2010 1617   NITRITE NEGATIVE 10/13/2018 1232   LEUKOCYTESUR TRACE (A) 10/13/2018 1232   Sepsis Labs Invalid input(s): PROCALCITONIN,  WBC,  LACTICIDVEN Microbiology Recent Results (from the past 240 hour(s))  SARS CORONAVIRUS 2 (TAT 6-24 HRS) Nasopharyngeal Nasopharyngeal Swab     Status: None   Collection Time: 10/06/19  6:50 PM   Specimen: Nasopharyngeal Swab  Result Value Ref Range Status   SARS Coronavirus 2 NEGATIVE NEGATIVE Final    Comment: (NOTE) SARS-CoV-2 target nucleic acids are NOT DETECTED. The SARS-CoV-2 RNA is generally detectable in upper and lower respiratory specimens during the acute phase of infection.  Negative results do not preclude SARS-CoV-2 infection, do not rule out co-infections with other pathogens, and should not be used as the sole basis for treatment or other patient management decisions. Negative results must be combined with clinical observations, patient history, and epidemiological information. The expected result is Negative. Fact Sheet for Patients: SugarRoll.be Fact Sheet for Healthcare Providers: https://www.woods-mathews.com/ This test is not yet approved or cleared by the [place] FDA and  has been authorized for detection and/or diagnosis of SARS-CoV-2 by FDA under an Emergency Use Authorization (EUA). This EUA will remain  in effect (meaning this test can be used) for the duration of the COVID-19 declaration under Section 56 4(b)(1) of the Act, 21 U.S.C. section 360bbb-3(b)(1), unless the authorization is terminated or revoked sooner. Performed at [facility], [place] [address], [place], [place] [address]      Time coordinating discharge: 31  minutes  SIGNED:   Hosie Poisson, MD  [facility] 10/09/2019, 12:41 PM Pager   If 7PM-7AM, please contact night-coverage [email or web address] Password [email or web address]

## 2019-11-10 ENCOUNTER — Emergency Department (HOSPITAL_COMMUNITY): Payer: Medicare Other

## 2019-11-10 ENCOUNTER — Inpatient Hospital Stay (HOSPITAL_COMMUNITY): Payer: Medicare Other

## 2019-11-10 ENCOUNTER — Inpatient Hospital Stay (HOSPITAL_COMMUNITY)
Admission: EM | Admit: 2019-11-10 | Discharge: 2019-11-16 | DRG: 377 | Disposition: A | Discharge status: Home Health | Payer: Medicare Other | Attending: Internal Medicine | Admitting: Internal Medicine

## 2019-11-10 ENCOUNTER — Encounter (HOSPITAL_COMMUNITY): Payer: Self-pay | Admitting: Radiology

## 2019-11-10 DIAGNOSIS — I5033 Acute on chronic diastolic (congestive) heart failure: Secondary | ICD-10-CM | POA: Diagnosis present

## 2019-11-10 DIAGNOSIS — Z96652 Presence of left artificial knee joint: Secondary | ICD-10-CM | POA: Diagnosis present

## 2019-11-10 DIAGNOSIS — F32A Depression, unspecified: Secondary | ICD-10-CM | POA: Diagnosis present

## 2019-11-10 DIAGNOSIS — S51811A Laceration without foreign body of right forearm, initial encounter: Secondary | ICD-10-CM | POA: Diagnosis present

## 2019-11-10 DIAGNOSIS — G894 Chronic pain syndrome: Secondary | ICD-10-CM | POA: Diagnosis present

## 2019-11-10 DIAGNOSIS — Z20828 Contact with and (suspected) exposure to other viral communicable diseases: Secondary | ICD-10-CM | POA: Diagnosis present

## 2019-11-10 DIAGNOSIS — L03115 Cellulitis of right lower limb: Secondary | ICD-10-CM | POA: Diagnosis present

## 2019-11-10 DIAGNOSIS — L03119 Cellulitis of unspecified part of limb: Secondary | ICD-10-CM

## 2019-11-10 DIAGNOSIS — F329 Major depressive disorder, single episode, unspecified: Secondary | ICD-10-CM | POA: Diagnosis present

## 2019-11-10 DIAGNOSIS — N390 Urinary tract infection, site not specified: Secondary | ICD-10-CM | POA: Diagnosis present

## 2019-11-10 DIAGNOSIS — T402X5A Adverse effect of other opioids, initial encounter: Secondary | ICD-10-CM | POA: Diagnosis present

## 2019-11-10 DIAGNOSIS — Z8711 Personal history of peptic ulcer disease: Secondary | ICD-10-CM

## 2019-11-10 DIAGNOSIS — K2971 Gastritis, unspecified, with bleeding: Secondary | ICD-10-CM

## 2019-11-10 DIAGNOSIS — G9341 Metabolic encephalopathy: Secondary | ICD-10-CM | POA: Diagnosis present

## 2019-11-10 DIAGNOSIS — Z7189 Other specified counseling: Secondary | ICD-10-CM | POA: Diagnosis not present

## 2019-11-10 DIAGNOSIS — Z9181 History of falling: Secondary | ICD-10-CM

## 2019-11-10 DIAGNOSIS — Y92410 Unspecified street and highway as the place of occurrence of the external cause: Secondary | ICD-10-CM

## 2019-11-10 DIAGNOSIS — E785 Hyperlipidemia, unspecified: Secondary | ICD-10-CM | POA: Diagnosis present

## 2019-11-10 DIAGNOSIS — Z515 Encounter for palliative care: Secondary | ICD-10-CM | POA: Diagnosis present

## 2019-11-10 DIAGNOSIS — Q211 Atrial septal defect: Secondary | ICD-10-CM

## 2019-11-10 DIAGNOSIS — F419 Anxiety disorder, unspecified: Secondary | ICD-10-CM | POA: Diagnosis present

## 2019-11-10 DIAGNOSIS — M7989 Other specified soft tissue disorders: Secondary | ICD-10-CM | POA: Diagnosis present

## 2019-11-10 DIAGNOSIS — J189 Pneumonia, unspecified organism: Secondary | ICD-10-CM | POA: Diagnosis not present

## 2019-11-10 DIAGNOSIS — Y92009 Unspecified place in unspecified non-institutional (private) residence as the place of occurrence of the external cause: Secondary | ICD-10-CM

## 2019-11-10 DIAGNOSIS — Z952 Presence of prosthetic heart valve: Secondary | ICD-10-CM

## 2019-11-10 DIAGNOSIS — N3001 Acute cystitis with hematuria: Secondary | ICD-10-CM | POA: Diagnosis not present

## 2019-11-10 DIAGNOSIS — N179 Acute kidney failure, unspecified: Secondary | ICD-10-CM | POA: Diagnosis present

## 2019-11-10 DIAGNOSIS — N400 Enlarged prostate without lower urinary tract symptoms: Secondary | ICD-10-CM | POA: Diagnosis present

## 2019-11-10 DIAGNOSIS — Z8673 Personal history of transient ischemic attack (TIA), and cerebral infarction without residual deficits: Secondary | ICD-10-CM | POA: Diagnosis not present

## 2019-11-10 DIAGNOSIS — K922 Gastrointestinal hemorrhage, unspecified: Secondary | ICD-10-CM | POA: Diagnosis not present

## 2019-11-10 DIAGNOSIS — M79604 Pain in right leg: Secondary | ICD-10-CM | POA: Diagnosis not present

## 2019-11-10 DIAGNOSIS — K5900 Constipation, unspecified: Secondary | ICD-10-CM | POA: Diagnosis present

## 2019-11-10 DIAGNOSIS — F411 Generalized anxiety disorder: Secondary | ICD-10-CM | POA: Diagnosis not present

## 2019-11-10 DIAGNOSIS — L97909 Non-pressure chronic ulcer of unspecified part of unspecified lower leg with unspecified severity: Secondary | ICD-10-CM | POA: Diagnosis present

## 2019-11-10 DIAGNOSIS — I83009 Varicose veins of unspecified lower extremity with ulcer of unspecified site: Secondary | ICD-10-CM | POA: Diagnosis present

## 2019-11-10 DIAGNOSIS — I110 Hypertensive heart disease with heart failure: Secondary | ICD-10-CM | POA: Diagnosis present

## 2019-11-10 DIAGNOSIS — B9620 Unspecified Escherichia coli [E. coli] as the cause of diseases classified elsewhere: Secondary | ICD-10-CM | POA: Diagnosis present

## 2019-11-10 DIAGNOSIS — S41111A Laceration without foreign body of right upper arm, initial encounter: Secondary | ICD-10-CM | POA: Diagnosis present

## 2019-11-10 DIAGNOSIS — M1710 Unilateral primary osteoarthritis, unspecified knee: Secondary | ICD-10-CM | POA: Diagnosis present

## 2019-11-10 DIAGNOSIS — N3000 Acute cystitis without hematuria: Secondary | ICD-10-CM | POA: Diagnosis present

## 2019-11-10 DIAGNOSIS — I4891 Unspecified atrial fibrillation: Secondary | ICD-10-CM | POA: Diagnosis not present

## 2019-11-10 DIAGNOSIS — I482 Chronic atrial fibrillation, unspecified: Secondary | ICD-10-CM | POA: Diagnosis not present

## 2019-11-10 DIAGNOSIS — I6389 Other cerebral infarction: Secondary | ICD-10-CM | POA: Diagnosis not present

## 2019-11-10 DIAGNOSIS — M19019 Primary osteoarthritis, unspecified shoulder: Secondary | ICD-10-CM | POA: Diagnosis present

## 2019-11-10 DIAGNOSIS — I4821 Permanent atrial fibrillation: Secondary | ICD-10-CM | POA: Diagnosis present

## 2019-11-10 DIAGNOSIS — Z981 Arthrodesis status: Secondary | ICD-10-CM

## 2019-11-10 DIAGNOSIS — D649 Anemia, unspecified: Secondary | ICD-10-CM

## 2019-11-10 DIAGNOSIS — S301XXA Contusion of abdominal wall, initial encounter: Secondary | ICD-10-CM | POA: Diagnosis present

## 2019-11-10 DIAGNOSIS — M7918 Myalgia, other site: Secondary | ICD-10-CM | POA: Diagnosis present

## 2019-11-10 DIAGNOSIS — I480 Paroxysmal atrial fibrillation: Secondary | ICD-10-CM | POA: Insufficient documentation

## 2019-11-10 DIAGNOSIS — R0902 Hypoxemia: Secondary | ICD-10-CM | POA: Diagnosis present

## 2019-11-10 DIAGNOSIS — Z79899 Other long term (current) drug therapy: Secondary | ICD-10-CM

## 2019-11-10 DIAGNOSIS — D62 Acute posthemorrhagic anemia: Secondary | ICD-10-CM | POA: Diagnosis present

## 2019-11-10 DIAGNOSIS — R195 Other fecal abnormalities: Secondary | ICD-10-CM | POA: Diagnosis present

## 2019-11-10 DIAGNOSIS — E876 Hypokalemia: Secondary | ICD-10-CM | POA: Diagnosis present

## 2019-11-10 DIAGNOSIS — J159 Unspecified bacterial pneumonia: Secondary | ICD-10-CM | POA: Diagnosis present

## 2019-11-10 DIAGNOSIS — Z801 Family history of malignant neoplasm of trachea, bronchus and lung: Secondary | ICD-10-CM

## 2019-11-10 DIAGNOSIS — Z7901 Long term (current) use of anticoagulants: Secondary | ICD-10-CM

## 2019-11-10 DIAGNOSIS — L0390 Cellulitis, unspecified: Secondary | ICD-10-CM | POA: Diagnosis present

## 2019-11-10 LAB — HEMOGLOBIN AND HEMATOCRIT, BLOOD
HCT: 23 % — ABNORMAL LOW (ref 39.0–52.0)
HCT: 24 % — ABNORMAL LOW (ref 39.0–52.0)
Hemoglobin: 7 g/dL — ABNORMAL LOW (ref 13.0–17.0)
Hemoglobin: 7.2 g/dL — ABNORMAL LOW (ref 13.0–17.0)

## 2019-11-10 LAB — POCT I-STAT 7, (LYTES, BLD GAS, ICA,H+H)
Acid-Base Excess: 3 mmol/L — ABNORMAL HIGH (ref 0.0–2.0)
Bicarbonate: 28.6 mmol/L — ABNORMAL HIGH (ref 20.0–28.0)
Calcium, Ion: 1.23 mmol/L (ref 1.15–1.40)
HCT: 23 % — ABNORMAL LOW (ref 39.0–52.0)
Hemoglobin: 7.8 g/dL — ABNORMAL LOW (ref 13.0–17.0)
O2 Saturation: 99 %
Potassium: 3.7 mmol/L (ref 3.5–5.1)
Sodium: 142 mmol/L (ref 135–145)
TCO2: 30 mmol/L (ref 22–32)
pCO2 arterial: 51 mmHg — ABNORMAL HIGH (ref 32.0–48.0)
pH, Arterial: 7.356 (ref 7.350–7.450)
pO2, Arterial: 124 mmHg — ABNORMAL HIGH (ref 83.0–108.0)

## 2019-11-10 LAB — BLOOD GAS, ARTERIAL
Acid-Base Excess: 5.4 mmol/L — ABNORMAL HIGH (ref 0.0–2.0)
Bicarbonate: 30.1 mmol/L — ABNORMAL HIGH (ref 20.0–28.0)
Drawn by: 560021
FIO2: 28
O2 Saturation: 98.5 %
Patient temperature: 36.9
pCO2 arterial: 50 mmHg — ABNORMAL HIGH (ref 32.0–48.0)
pH, Arterial: 7.397 (ref 7.350–7.450)
pO2, Arterial: 118 mmHg — ABNORMAL HIGH (ref 83.0–108.0)

## 2019-11-10 LAB — COMPREHENSIVE METABOLIC PANEL
ALT: 15 U/L (ref 0–44)
AST: 23 U/L (ref 15–41)
Albumin: 3.3 g/dL — ABNORMAL LOW (ref 3.5–5.0)
Alkaline Phosphatase: 65 U/L (ref 38–126)
Anion gap: 7 (ref 5–15)
BUN: 43 mg/dL — ABNORMAL HIGH (ref 8–23)
CO2: 29 mmol/L (ref 22–32)
Calcium: 9 mg/dL (ref 8.9–10.3)
Chloride: 105 mmol/L (ref 98–111)
Creatinine, Ser: 1.32 mg/dL — ABNORMAL HIGH (ref 0.61–1.24)
GFR calc Af Amer: 60 mL/min — ABNORMAL LOW (ref >60)
GFR calc non Af Amer: 52 mL/min — ABNORMAL LOW (ref >60)
Glucose, Bld: 110 mg/dL — ABNORMAL HIGH (ref 70–99)
Potassium: 3.5 mmol/L (ref 3.5–5.1)
Sodium: 141 mmol/L (ref 135–145)
Total Bilirubin: 0.6 mg/dL (ref 0.3–1.2)
Total Protein: 6.9 g/dL (ref 6.5–8.1)

## 2019-11-10 LAB — URINALYSIS, ROUTINE W REFLEX MICROSCOPIC
Bilirubin Urine: NEGATIVE
Glucose, UA: NEGATIVE mg/dL
Ketones, ur: NEGATIVE mg/dL
Nitrite: NEGATIVE
Protein, ur: NEGATIVE mg/dL
Specific Gravity, Urine: 1.008 (ref 1.005–1.030)
WBC, UA: >50 WBC/hpf — ABNORMAL HIGH (ref 0–5)
pH: 5 (ref 5.0–8.0)

## 2019-11-10 LAB — CBC WITH DIFFERENTIAL/PLATELET
Abs Immature Granulocytes: 0.01 10*3/uL (ref 0.00–0.07)
Basophils Absolute: 0 10*3/uL (ref 0.0–0.1)
Basophils Relative: 1 %
Eosinophils Absolute: 0.6 10*3/uL — ABNORMAL HIGH (ref 0.0–0.5)
Eosinophils Relative: 8 %
HCT: 25.6 % — ABNORMAL LOW (ref 39.0–52.0)
Hemoglobin: 7.7 g/dL — ABNORMAL LOW (ref 13.0–17.0)
Immature Granulocytes: 0 %
Lymphocytes Relative: 17 %
Lymphs Abs: 1.3 10*3/uL (ref 0.7–4.0)
MCH: 25.9 pg — ABNORMAL LOW (ref 26.0–34.0)
MCHC: 30.1 g/dL (ref 30.0–36.0)
MCV: 86.2 fL (ref 80.0–100.0)
Monocytes Absolute: 1.1 10*3/uL — ABNORMAL HIGH (ref 0.1–1.0)
Monocytes Relative: 14 %
Neutro Abs: 4.6 10*3/uL (ref 1.7–7.7)
Neutrophils Relative %: 60 %
Platelets: 249 10*3/uL (ref 150–400)
RBC: 2.97 MIL/uL — ABNORMAL LOW (ref 4.22–5.81)
RDW: 18.1 % — ABNORMAL HIGH (ref 11.5–15.5)
WBC: 7.7 10*3/uL (ref 4.0–10.5)
nRBC: 0 % (ref 0.0–0.2)

## 2019-11-10 LAB — I-STAT CHEM 8, ED
BUN: 40 mg/dL — ABNORMAL HIGH (ref 8–23)
Calcium, Ion: 1.2 mmol/L (ref 1.15–1.40)
Chloride: 104 mmol/L (ref 98–111)
Creatinine, Ser: 1.2 mg/dL (ref 0.61–1.24)
Glucose, Bld: 98 mg/dL (ref 70–99)
HCT: 25 % — ABNORMAL LOW (ref 39.0–52.0)
Hemoglobin: 8.5 g/dL — ABNORMAL LOW (ref 13.0–17.0)
Potassium: 3.8 mmol/L (ref 3.5–5.1)
Sodium: 143 mmol/L (ref 135–145)
TCO2: 29 mmol/L (ref 22–32)

## 2019-11-10 LAB — CREATININE, URINE, RANDOM: Creatinine, Urine: 48.69 mg/dL

## 2019-11-10 LAB — RAPID URINE DRUG SCREEN, HOSP PERFORMED
Amphetamines: NOT DETECTED
Barbiturates: NOT DETECTED
Benzodiazepines: NOT DETECTED
Cocaine: NOT DETECTED
Opiates: POSITIVE — AB
Tetrahydrocannabinol: NOT DETECTED

## 2019-11-10 LAB — POC OCCULT BLOOD, ED: Fecal Occult Bld: POSITIVE — AB

## 2019-11-10 LAB — SODIUM, URINE, RANDOM: Sodium, Ur: 92 mmol/L

## 2019-11-10 LAB — BRAIN NATRIURETIC PEPTIDE: B Natriuretic Peptide: 468.1 pg/mL — ABNORMAL HIGH (ref 0.0–100.0)

## 2019-11-10 LAB — TSH: TSH: 1.24 u[IU]/mL (ref 0.350–4.500)

## 2019-11-10 LAB — C-REACTIVE PROTEIN: CRP: 5.4 mg/dL — ABNORMAL HIGH (ref <1.0)

## 2019-11-10 LAB — SARS CORONAVIRUS 2 (TAT 6-24 HRS): SARS Coronavirus 2: NEGATIVE

## 2019-11-10 LAB — ETHANOL: Alcohol, Ethyl (B): <10 mg/dL (ref <10)

## 2019-11-10 LAB — MAGNESIUM: Magnesium: 2.3 mg/dL (ref 1.7–2.4)

## 2019-11-10 MED ORDER — ACETAMINOPHEN 325 MG PO TABS: 650.0000 mg | ORAL_TABLET | Freq: Four times a day (QID) | ORAL | Status: DC | PRN

## 2019-11-10 MED ORDER — TETANUS-DIPHTH-ACELL PERTUSSIS 5-2.5-18.5 LF-MCG/0.5 IM SUSP
0.5000 mL | Freq: Once | INTRAMUSCULAR | Status: DC
  Filled 2019-11-10: qty 0.5

## 2019-11-10 MED ORDER — SODIUM CHLORIDE 0.9 % IV BOLUS
500.0000 mL | Freq: Once | INTRAVENOUS | Status: AC
  Administered 2019-11-10: 21:00:00 500 mL via INTRAVENOUS

## 2019-11-10 MED ORDER — DILTIAZEM HCL ER COATED BEADS 120 MG PO CP24
120.0000 mg | ORAL_CAPSULE | Freq: Every day | ORAL | Status: DC
  Filled 2019-11-10: qty 1

## 2019-11-10 MED ORDER — FENTANYL CITRATE (PF) 100 MCG/2ML IJ SOLN
50.0000 ug | Freq: Once | INTRAMUSCULAR | Status: AC
  Administered 2019-11-10: 50 ug via INTRAVENOUS
  Filled 2019-11-10: qty 2

## 2019-11-10 MED ORDER — SODIUM CHLORIDE 0.9 % IV SOLN
1.0000 g | INTRAVENOUS | Status: AC
  Administered 2019-11-11 – 2019-11-15 (×5): 1 g via INTRAVENOUS
  Filled 2019-11-10 (×5): qty 10

## 2019-11-10 MED ORDER — METOPROLOL TARTRATE 50 MG PO TABS
50.0000 mg | ORAL_TABLET | Freq: Two times a day (BID) | ORAL | Status: DC
  Administered 2019-11-11 – 2019-11-12 (×2): 50 mg via ORAL
  Filled 2019-11-10 (×3): qty 1

## 2019-11-10 MED ORDER — SODIUM CHLORIDE 0.9% FLUSH
3.0000 mL | Freq: Two times a day (BID) | INTRAVENOUS | Status: DC
  Administered 2019-11-10 – 2019-11-16 (×11): 3 mL via INTRAVENOUS

## 2019-11-10 MED ORDER — SODIUM CHLORIDE 0.9 % IV SOLN
1.0000 g | Freq: Once | INTRAVENOUS | Status: AC
  Administered 2019-11-10: 1 g via INTRAVENOUS
  Filled 2019-11-10: qty 10

## 2019-11-10 MED ORDER — SODIUM CHLORIDE 0.9 % IV BOLUS: 500.0000 mL | Freq: Once | INTRAVENOUS | Status: DC

## 2019-11-10 MED ORDER — HYDROCODONE-ACETAMINOPHEN 5-325 MG PO TABS: 1.0000 | ORAL_TABLET | ORAL | Status: DC | PRN

## 2019-11-10 MED ORDER — NALOXONE HCL 0.4 MG/ML IJ SOLN
0.4000 mg | INTRAMUSCULAR | Status: DC | PRN
  Administered 2019-11-10: 0.4 mg via INTRAVENOUS
  Filled 2019-11-10: qty 1

## 2019-11-10 MED ORDER — OXYCODONE-ACETAMINOPHEN 5-325 MG PO TABS
1.0000 | ORAL_TABLET | Freq: Four times a day (QID) | ORAL | Status: DC | PRN
  Administered 2019-11-11 – 2019-11-16 (×16): 1 via ORAL
  Filled 2019-11-10 (×16): qty 1

## 2019-11-10 MED ORDER — SODIUM CHLORIDE 0.9 % IV SOLN
INTRAVENOUS | Status: DC
  Administered 2019-11-10 – 2019-11-11 (×2): via INTRAVENOUS

## 2019-11-10 MED ORDER — DILTIAZEM HCL 25 MG/5ML IV SOLN
5.0000 mg | Freq: Once | INTRAVENOUS | Status: AC
  Administered 2019-11-10: 10:00:00 5 mg via INTRAVENOUS
  Filled 2019-11-10: qty 5

## 2019-11-10 MED ORDER — ALBUTEROL SULFATE (2.5 MG/3ML) 0.083% IN NEBU: 2.5000 mg | INHALATION_SOLUTION | Freq: Four times a day (QID) | RESPIRATORY_TRACT | Status: DC | PRN

## 2019-11-10 MED ORDER — IOHEXOL 300 MG/ML  SOLN
100.0000 mL | Freq: Once | INTRAMUSCULAR | Status: AC | PRN
  Administered 2019-11-10: 04:00:00 100 mL via INTRAVENOUS

## 2019-11-10 MED ORDER — SODIUM CHLORIDE 0.9 % IV BOLUS
1000.0000 mL | Freq: Once | INTRAVENOUS | Status: AC
  Administered 2019-11-10: 1000 mL via INTRAVENOUS

## 2019-11-10 MED ORDER — FUROSEMIDE 10 MG/ML IJ SOLN
40.0000 mg | Freq: Two times a day (BID) | INTRAMUSCULAR | Status: DC
  Administered 2019-11-10 – 2019-11-11 (×2): 40 mg via INTRAVENOUS
  Filled 2019-11-10 (×2): qty 4

## 2019-11-10 MED ORDER — ACETAMINOPHEN 650 MG RE SUPP: 650.0000 mg | Freq: Four times a day (QID) | RECTAL | Status: DC | PRN

## 2019-11-10 MED ORDER — HALOPERIDOL LACTATE 5 MG/ML IJ SOLN
5.0000 mg | Freq: Once | INTRAMUSCULAR | Status: AC
  Administered 2019-11-10: 5 mg via INTRAVENOUS
  Filled 2019-11-10: qty 1

## 2019-11-10 MED ORDER — TAMSULOSIN HCL 0.4 MG PO CAPS
0.4000 mg | ORAL_CAPSULE | Freq: Every day | ORAL | Status: DC
  Administered 2019-11-11 – 2019-11-16 (×6): 0.4 mg via ORAL
  Filled 2019-11-10 (×6): qty 1

## 2019-11-10 MED ORDER — DILTIAZEM HCL-DEXTROSE 125-5 MG/125ML-% IV SOLN (PREMIX)
5.0000 mg/h | INTRAVENOUS | Status: DC
  Administered 2019-11-10 (×2): 5 mg/h via INTRAVENOUS
  Filled 2019-11-10 (×2): qty 125

## 2019-11-10 MED ORDER — MORPHINE SULFATE (PF) 2 MG/ML IV SOLN
2.0000 mg | INTRAVENOUS | Status: DC | PRN
  Administered 2019-11-10 – 2019-11-13 (×8): 2 mg via INTRAVENOUS
  Filled 2019-11-10 (×8): qty 1

## 2019-11-10 MED ORDER — PANTOPRAZOLE SODIUM 40 MG IV SOLR
40.0000 mg | Freq: Two times a day (BID) | INTRAVENOUS | Status: DC
  Administered 2019-11-10 – 2019-11-12 (×6): 40 mg via INTRAVENOUS
  Filled 2019-11-10 (×6): qty 40

## 2019-11-10 MED ORDER — FLUTICASONE PROPIONATE 50 MCG/ACT NA SUSP
1.0000 | Freq: Every day | NASAL | Status: DC | PRN
  Filled 2019-11-10: qty 16
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
## 2019-11-10 NOTE — Progress Notes (Signed)
Pt disoriented, kicking legs and sliding down table out of head coil. Unable to scan at this time. RN aware.

## 2019-11-10 NOTE — ED Notes (Signed)
Patient transported to MRI 

## 2019-11-10 NOTE — ED Notes (Signed)
Dr. Tamala Julian at bedside, aware of neuro status

## 2019-11-10 NOTE — ED Notes (Signed)
Pt yelling in room, c/o neck pain; Dr. Tamala Julian notified

## 2019-11-10 NOTE — ED Notes (Signed)
Dr. Tamala Julian notified of discontinuation of diltiazem; directed to re-start diltiazem if hr greater than or equal to 130; Elisa, RN taking over care of pt, notified of verbal order
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
## 2019-11-10 NOTE — Progress Notes (Signed)
  [name] is a 78 y.o. male patient admitted from ED aroused by voice and lethargic - oriented X 3 - no acute distress noted. VSS - Blood pressure 117/68, pulse 71, temperature 98.4 F (36.9 C), temperature source Axillary, resp. rate 14, height 5\' 9"  (1.753 m), weight 122.5 kg, SpO2 100 %. no c/o shortness of breath, no c/o chest pain. Orientation to room, and floor completed with information packet given to patient/family. Admission INP armband ID verified with patient/family, and in place.  Call light within reach, patient able to voice, and demonstrate understanding. Skin with generalized abrasions and bruising. Skin tear to right arm cleaned and wrapped in ABD and kerlix.  No evidence of skin break down noted on exam.  ?  Will cont to eval and treat per MD orders.  Melonie Florida, RN  11/10/2019 7:15 PM
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
## 2019-11-10 NOTE — ED Notes (Signed)
MRI tech notified that this pt unable to tolerate MRI, pt "kept trying to move to the end of the table"; Dr. Tamala Julian notified
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
## 2019-11-10 NOTE — Progress Notes (Signed)
Patient ID: [name], male   DOB: Oct 30, 1941, 78 y.o.   MRN: [id number]  Discussed patient with Dr. Fuller Plan earlier this morning and on standby to see for GI consult pending evaluation for possible stroke with brain MRI.

## 2019-11-10 NOTE — ED Notes (Signed)
Pt alert to painful stimuli only; Dr. Tamala Julian notified
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
## 2019-11-10 NOTE — ED Notes (Signed)
Patient not a/o enough to safely ambulate.

## 2019-11-10 NOTE — ED Notes (Addendum)
Pt continues to have intermittent episodes of yelling and lethargy; Dr. Tamala Julian aware

## 2019-11-11 ENCOUNTER — Inpatient Hospital Stay (HOSPITAL_COMMUNITY): Payer: Medicare Other

## 2019-11-11 DIAGNOSIS — M7989 Other specified soft tissue disorders: Secondary | ICD-10-CM

## 2019-11-11 DIAGNOSIS — I6389 Other cerebral infarction: Secondary | ICD-10-CM

## 2019-11-11 DIAGNOSIS — L03115 Cellulitis of right lower limb: Secondary | ICD-10-CM

## 2019-11-11 LAB — BASIC METABOLIC PANEL
Anion gap: 10 (ref 5–15)
BUN: 25 mg/dL — ABNORMAL HIGH (ref 8–23)
CO2: 28 mmol/L (ref 22–32)
Calcium: 8.4 mg/dL — ABNORMAL LOW (ref 8.9–10.3)
Chloride: 105 mmol/L (ref 98–111)
Creatinine, Ser: 1.01 mg/dL (ref 0.61–1.24)
GFR calc Af Amer: >60 mL/min (ref >60)
GFR calc non Af Amer: >60 mL/min (ref >60)
Glucose, Bld: 91 mg/dL (ref 70–99)
Potassium: 3.5 mmol/L (ref 3.5–5.1)
Sodium: 143 mmol/L (ref 135–145)

## 2019-11-11 LAB — URINE CULTURE: Culture: >=100000 — AB

## 2019-11-11 LAB — CBC
HCT: 23.5 % — ABNORMAL LOW (ref 39.0–52.0)
Hemoglobin: 7 g/dL — ABNORMAL LOW (ref 13.0–17.0)
MCH: 25.6 pg — ABNORMAL LOW (ref 26.0–34.0)
MCHC: 29.8 g/dL — ABNORMAL LOW (ref 30.0–36.0)
MCV: 86.1 fL (ref 80.0–100.0)
Platelets: 220 10*3/uL (ref 150–400)
RBC: 2.73 MIL/uL — ABNORMAL LOW (ref 4.22–5.81)
RDW: 18 % — ABNORMAL HIGH (ref 11.5–15.5)
WBC: 6.6 10*3/uL (ref 4.0–10.5)
nRBC: 0 % (ref 0.0–0.2)

## 2019-11-11 LAB — LIPID PANEL
Cholesterol: 112 mg/dL (ref 0–200)
HDL: 46 mg/dL (ref >40)
LDL Cholesterol: 57 mg/dL (ref 0–99)
Total CHOL/HDL Ratio: 2.4 RATIO
Triglycerides: 45 mg/dL (ref <150)
VLDL: 9 mg/dL (ref 0–40)

## 2019-11-11 LAB — HEMOGLOBIN A1C
Hgb A1c MFr Bld: 5.8 % — ABNORMAL HIGH (ref 4.8–5.6)
Mean Plasma Glucose: 119.76 mg/dL

## 2019-11-11 LAB — PREPARE RBC (CROSSMATCH)

## 2019-11-11 LAB — LACTIC ACID, PLASMA
Lactic Acid, Venous: 0.9 mmol/L (ref 0.5–1.9)
Lactic Acid, Venous: 1.3 mmol/L (ref 0.5–1.9)

## 2019-11-11 LAB — HEMOGLOBIN AND HEMATOCRIT, BLOOD
HCT: 34.9 % — ABNORMAL LOW (ref 39.0–52.0)
Hemoglobin: 10.3 g/dL — ABNORMAL LOW (ref 13.0–17.0)

## 2019-11-11 LAB — UREA NITROGEN, URINE: Urea Nitrogen, Ur: 726 mg/dL

## 2019-11-11 LAB — PROTIME-INR
INR: 1.4 — ABNORMAL HIGH (ref 0.8–1.2)
Prothrombin Time: 16.6 seconds — ABNORMAL HIGH (ref 11.4–15.2)

## 2019-11-11 LAB — STREP PNEUMONIAE URINARY ANTIGEN: Strep Pneumo Urinary Antigen: NEGATIVE

## 2019-11-11 MED ORDER — PERFLUTREN LIPID MICROSPHERE
1.0000 mL | INTRAVENOUS | Status: AC | PRN
  Administered 2019-11-11: 2 mL via INTRAVENOUS
  Filled 2019-11-11: qty 10

## 2019-11-11 MED ORDER — ALUM & MAG HYDROXIDE-SIMETH 200-200-20 MG/5ML PO SUSP
30.0000 mL | ORAL | Status: DC | PRN
  Administered 2019-11-11: 30 mL via ORAL
  Filled 2019-11-11: qty 30

## 2019-11-11 MED ORDER — SODIUM CHLORIDE 0.9% FLUSH: 9.0000 mL | INTRAVENOUS | Status: DC | PRN

## 2019-11-11 MED ORDER — TOPIRAMATE 25 MG PO TABS
25.0000 mg | ORAL_TABLET | Freq: Two times a day (BID) | ORAL | Status: DC
  Administered 2019-11-11 – 2019-11-16 (×11): 25 mg via ORAL
  Filled 2019-11-11 (×12): qty 1

## 2019-11-11 MED ORDER — DILTIAZEM HCL ER COATED BEADS 120 MG PO CP24
120.0000 mg | ORAL_CAPSULE | Freq: Every day | ORAL | Status: DC
  Administered 2019-11-11 – 2019-11-16 (×6): 120 mg via ORAL
  Filled 2019-11-11 (×6): qty 1

## 2019-11-11 MED ORDER — BISACODYL 10 MG RE SUPP: 10.0000 mg | Freq: Every day | RECTAL | Status: DC | PRN

## 2019-11-11 MED ORDER — BUPROPION HCL 100 MG PO TABS
100.0000 mg | ORAL_TABLET | Freq: Two times a day (BID) | ORAL | Status: DC
  Administered 2019-11-11 – 2019-11-16 (×11): 100 mg via ORAL
  Filled 2019-11-11 (×12): qty 1

## 2019-11-11 MED ORDER — POLYETHYLENE GLYCOL 3350 17 G PO PACK
17.0000 g | PACK | Freq: Every day | ORAL | Status: DC
  Administered 2019-11-11 – 2019-11-14 (×4): 17 g via ORAL
  Filled 2019-11-11 (×6): qty 1

## 2019-11-11 MED ORDER — SODIUM CHLORIDE 0.9% IV SOLUTION
Freq: Once | INTRAVENOUS | Status: AC
  Administered 2019-11-11: 10:00:00 via INTRAVENOUS

## 2019-11-11 MED ORDER — SENNOSIDES-DOCUSATE SODIUM 8.6-50 MG PO TABS
1.0000 | ORAL_TABLET | Freq: Two times a day (BID) | ORAL | Status: DC
  Administered 2019-11-11 – 2019-11-15 (×10): 1 via ORAL
  Filled 2019-11-11 (×10): qty 1

## 2019-11-11 MED ORDER — AZITHROMYCIN 500 MG PO TABS
500.0000 mg | ORAL_TABLET | Freq: Every day | ORAL | Status: AC
  Administered 2019-11-11 – 2019-11-15 (×5): 500 mg via ORAL
  Filled 2019-11-11 (×5): qty 1

## 2019-11-11 MED ORDER — CYCLOBENZAPRINE HCL 5 MG PO TABS
5.0000 mg | ORAL_TABLET | Freq: Two times a day (BID) | ORAL | Status: DC | PRN
  Administered 2019-11-11 – 2019-11-14 (×4): 5 mg via ORAL
  Filled 2019-11-11 (×4): qty 1

## 2019-11-11 MED ORDER — METHOCARBAMOL 500 MG PO TABS
500.0000 mg | ORAL_TABLET | Freq: Two times a day (BID) | ORAL | Status: DC
  Administered 2019-11-11 – 2019-11-16 (×11): 500 mg via ORAL
  Filled 2019-11-11 (×11): qty 1

## 2019-11-11 MED ORDER — ROPINIROLE HCL 0.25 MG PO TABS
0.2500 mg | ORAL_TABLET | Freq: Two times a day (BID) | ORAL | Status: DC
  Administered 2019-11-11 – 2019-11-16 (×11): 0.25 mg via ORAL
  Filled 2019-11-11 (×13): qty 1

## 2019-11-11 MED ORDER — SALINE SPRAY 0.65 % NA SOLN
1.0000 | NASAL | Status: DC | PRN
  Administered 2019-11-11: 1 via NASAL
  Filled 2019-11-11 (×2): qty 44

## 2019-11-11 MED ORDER — FUROSEMIDE 10 MG/ML IJ SOLN
80.0000 mg | Freq: Two times a day (BID) | INTRAMUSCULAR | Status: DC
  Administered 2019-11-11 – 2019-11-14 (×6): 80 mg via INTRAVENOUS
  Filled 2019-11-11 (×6): qty 8

## 2019-11-11 NOTE — Progress Notes (Signed)
Blood Transfusion started at 11:05am.  We were a little held because I needed to get another PIV site. IV team came and put one in Right upper arm.  It is flushing well.  Pt also had bubble study going.  I waited for Bubble study to be done then we started the transfusion.

## 2019-11-11 NOTE — Progress Notes (Signed)
PT Cancellation Note  Patient Details Name: [name] MRN: [id number] DOB: May 11, 1941   Cancelled Treatment:    Reason Eval/Treat Not Completed: Other (comment) awaiting results from scheduled dopper to r/o DVT. Also noted episode of acute lethargy/aphasia last night, plan to discuss with RN regarding patient's medical readiness for potential PT later in day. Will follow acutely.    Windell Norfolk, DPT, PN1   Supplemental Physical Therapist [facility]    Pager [phone] Acute Rehab Office [phone]
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
## 2019-11-11 NOTE — Progress Notes (Signed)
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 wall thickening. Inspissated stool ball at the rectum with mild perirectal stranding. Vascular/Lymphatic: Atherosclerotic plaque in the abdominal aorta and branch vessels. No suspicious or enlarged lymph nodes in the included lymphatic chains. Reproductive: The prostate and seminal vesicles are unremarkable. Other: There is extensive circumferential body wall edema. More focal soft tissue stranding and skin thickening in the flanks may reflect body wall contusion. Fat seen in both inguinal canals. No bowel containing hernias. No free fluid or air in the abdomen or pelvis. Musculoskeletal: Multilevel degenerative changes are present in the imaged portions of the spine. No acute osseous abnormality or suspicious osseous lesion. IMPRESSION: 1. No evidence of acute traumatic injury within the chest, abdomen, or pelvis. 2. Prior transcatheter aortic valve replacement. 3. Prior C6-C7 and T10-T11 spinal fusions. 4. Remote anterior left rib fractures. 5. Patchy ground-glass opacity in the  anterior segment of the left upper lobe and to a lesser extent in the right middle lobe, possibly reflecting acute infection and/or inflammation. Consider follow-up imaging in 6-10 weeks to assess for resolution. 6. Trace left and small right pleural effusions. 7. Prior gastric suture line.  Correlate with surgical history. 8. Inspissated stool ball at the rectum with perirectal stranding. Correlate for impaction or features of stercoral colitis. 9. Nonobstructing right nephrolithiasis. 10. Extensive circumferential body wall edema. More focal soft tissue stranding and skin thickening in the flanks may reflect body wall contusion, correlate with exam findings. 11. Aortic Atherosclerosis (ICD10-I70.0). Electronically Signed   By: Lovena Le M.D.   On: 11/10/2019 04:28        Scheduled Meds:  sodium chloride   Intravenous Once   buPROPion  100 mg Oral BID   diltiazem  120 mg Oral Daily   furosemide  40 mg Intravenous BID   methocarbamol  500 mg Oral BID   metoprolol tartrate  50 mg Oral BID  pantoprazole (PROTONIX) IV  40 mg Intravenous Q12H   rOPINIRole  0.25 mg Oral BID   sodium chloride flush  3 mL Intravenous Q12H   tamsulosin  0.4 mg Oral Daily   Tdap  0.5 mL Intramuscular Once   topiramate  25 mg Oral BID   Continuous Infusions:  sodium chloride 75 mL/hr at 11/11/19 0210   cefTRIAXone (ROCEPHIN)  IV     diltiazem (CARDIZEM) infusion 5 mg/hr (11/11/19 [id number])          Aline August, MD [facility] 11/11/2019, 10:34 AM

## 2019-11-11 NOTE — Progress Notes (Signed)
Bilateral lower ext venous duplex  has been completed. Refer to [facility] under chart review to view preliminary results.   11/11/2019  10:51 AM Gotti Alwin, Bonnye Fava

## 2019-11-11 NOTE — Progress Notes (Signed)
Rehab Admissions Coordinator Note:  Per therapy recommendations, patient was screened by Michel Santee for appropriateness for an Inpatient Acute Rehab Consult.  Note pt with negative MRI, and no other rehab diagnosis.  Would recommend SNF for f/u therapy if pt's family does not feel like they can provide assist to patient at home.   Michel Santee 11/11/2019, 3:43 PM  I can be reached at [phone].

## 2019-11-11 NOTE — Progress Notes (Signed)
  Echocardiogram 2D Echocardiogram with bubble study and definity has been performed.  [name] M 11/11/2019, 10:31 AM
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
## 2019-11-12 DIAGNOSIS — J189 Pneumonia, unspecified organism: Secondary | ICD-10-CM

## 2019-11-12 DIAGNOSIS — Z515 Encounter for palliative care: Secondary | ICD-10-CM

## 2019-11-12 DIAGNOSIS — Z7189 Other specified counseling: Secondary | ICD-10-CM

## 2019-11-12 LAB — TYPE AND SCREEN
ABO/RH(D): A POS
Antibody Screen: NEGATIVE
Unit division: 0

## 2019-11-12 LAB — CBC WITH DIFFERENTIAL/PLATELET
Abs Immature Granulocytes: 0.02 10*3/uL (ref 0.00–0.07)
Basophils Absolute: 0 10*3/uL (ref 0.0–0.1)
Basophils Relative: 1 %
Eosinophils Absolute: 0.4 10*3/uL (ref 0.0–0.5)
Eosinophils Relative: 7 %
HCT: 24.5 % — ABNORMAL LOW (ref 39.0–52.0)
Hemoglobin: 7.8 g/dL — ABNORMAL LOW (ref 13.0–17.0)
Immature Granulocytes: 0 %
Lymphocytes Relative: 12 %
Lymphs Abs: 0.8 10*3/uL (ref 0.7–4.0)
MCH: 26.5 pg (ref 26.0–34.0)
MCHC: 31.8 g/dL (ref 30.0–36.0)
MCV: 83.3 fL (ref 80.0–100.0)
Monocytes Absolute: 0.9 10*3/uL (ref 0.1–1.0)
Monocytes Relative: 15 %
Neutro Abs: 4.1 10*3/uL (ref 1.7–7.7)
Neutrophils Relative %: 65 %
Platelets: 217 10*3/uL (ref 150–400)
RBC: 2.94 MIL/uL — ABNORMAL LOW (ref 4.22–5.81)
RDW: 17.3 % — ABNORMAL HIGH (ref 11.5–15.5)
WBC: 6.2 10*3/uL (ref 4.0–10.5)
nRBC: 0 % (ref 0.0–0.2)

## 2019-11-12 LAB — COMPREHENSIVE METABOLIC PANEL
ALT: 15 U/L (ref 0–44)
AST: 19 U/L (ref 15–41)
Albumin: 2.9 g/dL — ABNORMAL LOW (ref 3.5–5.0)
Alkaline Phosphatase: 61 U/L (ref 38–126)
Anion gap: 10 (ref 5–15)
BUN: 16 mg/dL (ref 8–23)
CO2: 29 mmol/L (ref 22–32)
Calcium: 8.6 mg/dL — ABNORMAL LOW (ref 8.9–10.3)
Chloride: 102 mmol/L (ref 98–111)
Creatinine, Ser: 0.99 mg/dL (ref 0.61–1.24)
GFR calc Af Amer: >60 mL/min (ref >60)
GFR calc non Af Amer: >60 mL/min (ref >60)
Glucose, Bld: 92 mg/dL (ref 70–99)
Potassium: 3.4 mmol/L — ABNORMAL LOW (ref 3.5–5.1)
Sodium: 141 mmol/L (ref 135–145)
Total Bilirubin: 1.1 mg/dL (ref 0.3–1.2)
Total Protein: 6.3 g/dL — ABNORMAL LOW (ref 6.5–8.1)

## 2019-11-12 LAB — BPAM RBC
Blood Product Expiration Date: 202011282359
ISSUE DATE / TIME: 202011251046
Unit Type and Rh: 6200

## 2019-11-12 LAB — MAGNESIUM: Magnesium: 2.1 mg/dL (ref 1.7–2.4)

## 2019-11-12 MED ORDER — METOPROLOL TARTRATE 50 MG PO TABS
75.0000 mg | ORAL_TABLET | Freq: Two times a day (BID) | ORAL | Status: DC
  Administered 2019-11-12 – 2019-11-13 (×3): 75 mg via ORAL
  Administered 2019-11-14: 50 mg via ORAL
  Administered 2019-11-14 – 2019-11-16 (×4): 75 mg via ORAL
  Filled 2019-11-12 (×8): qty 1

## 2019-11-12 MED ORDER — POTASSIUM CHLORIDE CRYS ER 20 MEQ PO TBCR
40.0000 meq | EXTENDED_RELEASE_TABLET | Freq: Once | ORAL | Status: AC
  Administered 2019-11-12: 40 meq via ORAL
  Filled 2019-11-12: qty 2

## 2019-11-12 NOTE — Progress Notes (Signed)
Patient complained of severe back pain and was agitated when staff attempted to assess. This temporarily elevated his heart rate to the 140s. An axillary temperature taken was 100.56F. During this, the patient chipped his tooth. Nighttime provider notified of MEWS change to yellow (2). Patient is currently relaxed and conversational.  Continuing to monitor.

## 2019-11-12 NOTE — Progress Notes (Signed)
CSW acknowledges consult for nursing home place. CIR is also following.   CSW will continue to follow and assist as needed.   Domenic Schwab, MSW, [place] Worker [facility]  [phone]

## 2019-11-12 NOTE — Progress Notes (Signed)
Progress Note   Subjective   Doing well today, the patient denies CP or SOB.  No new concerns  Inpatient Medications    Scheduled Meds: . azithromycin  500 mg Oral Daily  . buPROPion  100 mg Oral BID  . diltiazem  120 mg Oral Daily  . furosemide  80 mg Intravenous BID  . methocarbamol  500 mg Oral BID  . metoprolol tartrate  75 mg Oral BID  . pantoprazole (PROTONIX) IV  40 mg Intravenous Q12H  . polyethylene glycol  17 g Oral Daily  . rOPINIRole  0.25 mg Oral BID  . senna-docusate  1 tablet Oral BID  . sodium chloride flush  3 mL Intravenous Q12H  . tamsulosin  0.4 mg Oral Daily  . Tdap  0.5 mL Intramuscular Once  . topiramate  25 mg Oral BID   Continuous Infusions: . cefTRIAXone (ROCEPHIN)  IV 1 g (11/12/19 0900)   PRN Meds: acetaminophen **OR** acetaminophen, albuterol, alum & mag hydroxide-simeth, bisacodyl, cyclobenzaprine, fluticasone, morphine injection, naLOXone (NARCAN)  injection, oxyCODONE-acetaminophen, sodium chloride, sodium chloride flush   Vital Signs    Vitals:   11/12/19 0425 11/12/19 0506 11/12/19 0758 11/12/19 0800  BP:    127/71  Pulse:  96    Resp:    20  Temp:  98.1 F (36.7 C) 98.3 F (36.8 C)   TempSrc:  Oral Oral   SpO2: 94% 94%    Weight:  118.6 kg    Height:        Intake/Output Summary (Last 24 hours) at 11/12/2019 1044 Last data filed at 11/12/2019 1029 Gross per 24 hour  Intake 690.18 ml  Output 6750 ml  Net -6059.82 ml   Filed Weights   11/10/19 0244 11/11/19 0324 11/12/19 0506  Weight: 122.5 kg 121.7 kg 118.6 kg    Telemetry    Afib, V rates 90s-110s - Personally Reviewed  Physical Exam   GEN- The patient is ill appearing, alert and oriented x 3 today.   Head- normocephalic, atraumatic Eyes-  Sclera clear, conjunctiva pink Ears- hearing intact Oropharynx- clear Neck- supple, Lungs-   normal work of breathing Heart- irregular rate and rhythm  GI- soft  Extremities- + dependant edema  MS- age appropriate  atrophy Psych- euthymic mood, full affect Neuro- strength and sensation are intact   Labs    Chemistry Recent Labs  Lab 11/10/19 0303 11/10/19 0323 11/10/19 1022 11/11/19 0340 11/12/19 0221  NA 141 143 142 143 141  K 3.5 3.8 3.7 3.5 3.4*  CL 105 104  --  105 102  CO2 29  --   --  28 29  GLUCOSE 110* 98  --  91 92  BUN 43* 40*  --  25* 16  CREATININE 1.32* 1.20  --  1.01 0.99  CALCIUM 9.0  --   --  8.4* 8.6*  PROT 6.9  --   --   --  6.3*  ALBUMIN 3.3*  --   --   --  2.9*  AST 23  --   --   --  19  ALT 15  --   --   --  15  ALKPHOS 65  --   --   --  61  BILITOT 0.6  --   --   --  1.1  GFRNONAA 52*  --   --  >60 >60  GFRAA 60*  --   --  >60 >60  ANIONGAP 7  --   --  10 10     Hematology Recent Labs  Lab 11/10/19 0303  11/11/19 0340 11/11/19 1630 11/12/19 0221  WBC 7.7  --  6.6  --  6.2  RBC 2.97*  --  2.73*  --  2.94*  HGB 7.7*   < > 7.0* 10.3* 7.8*  HCT 25.6*   < > 23.5* 34.9* 24.5*  MCV 86.2  --  86.1  --  83.3  MCH 25.9*  --  25.6*  --  26.5  MCHC 30.1  --  29.8*  --  31.8  RDW 18.1*  --  18.0*  --  17.3*  PLT 249  --  220  --  217   < > = values in this interval not displayed.   Patient Profile:   [name] is a 78 y.o. male with a hx of permanent afib on Eliquis, obesity, severe AS (s/p TAVR 05/2018 at [facility]), PFO, HTN, HLD, CVA, non-compliance, chronic pain who is being seen today for the evaluation of afib RVR and heart failure in the setting of acute anemia at the request of Dr. Starla Link.  Assessment & Plan    1.  Permanent afib V rates are improving Metoprolol increased to 75mg  BID today As his overall clinical picture improves, his V rates will likely improve as well Not currently a candidate for anticoagulation with ongoing issues with anemia  2. Acute on chronic diastolic dysfunction Continue IV diuresis  3. Anemia GI following  General cardiology to follow  Thompson Grayer MD, [facility] 11/12/2019 10:44 AM

## 2019-11-12 NOTE — Progress Notes (Signed)
PMT consult received and chart reviewed. Met with patient at bedside. [name] is awake, alert, oriented and able to participate in conversation. Continue FULL code/FULL scope treatment. [place] is very motivated to work with therapy and is agreeable to discharge to SNF for rehab, with hopes to return home following rehab. He reports he has a completed living will with sister as HCPOA. Would benefit from outpatient palliative referral at SNF for ongoing [place] discussions pending progression at rehab.  Full palliative note to follow.   NO CHARGE  Ihor Dow, [place], FNP-C Palliative Medicine Team  Phone: [phone] Fax: [phone]
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
## 2019-11-13 DIAGNOSIS — Z515 Encounter for palliative care: Secondary | ICD-10-CM

## 2019-11-13 DIAGNOSIS — Z7189 Other specified counseling: Secondary | ICD-10-CM

## 2019-11-13 DIAGNOSIS — D62 Acute posthemorrhagic anemia: Secondary | ICD-10-CM

## 2019-11-13 DIAGNOSIS — I482 Chronic atrial fibrillation, unspecified: Secondary | ICD-10-CM

## 2019-11-13 LAB — CBC WITH DIFFERENTIAL/PLATELET
Abs Immature Granulocytes: 0.03 10*3/uL (ref 0.00–0.07)
Basophils Absolute: 0 10*3/uL (ref 0.0–0.1)
Basophils Relative: 0 %
Eosinophils Absolute: 0.5 10*3/uL (ref 0.0–0.5)
Eosinophils Relative: 7 %
HCT: 27.2 % — ABNORMAL LOW (ref 39.0–52.0)
Hemoglobin: 8.4 g/dL — ABNORMAL LOW (ref 13.0–17.0)
Immature Granulocytes: 0 %
Lymphocytes Relative: 13 %
Lymphs Abs: 1.1 10*3/uL (ref 0.7–4.0)
MCH: 26.3 pg (ref 26.0–34.0)
MCHC: 30.9 g/dL (ref 30.0–36.0)
MCV: 85.3 fL (ref 80.0–100.0)
Monocytes Absolute: 1 10*3/uL (ref 0.1–1.0)
Monocytes Relative: 13 %
Neutro Abs: 5.1 10*3/uL (ref 1.7–7.7)
Neutrophils Relative %: 67 %
Platelets: 242 10*3/uL (ref 150–400)
RBC: 3.19 MIL/uL — ABNORMAL LOW (ref 4.22–5.81)
RDW: 17.4 % — ABNORMAL HIGH (ref 11.5–15.5)
WBC: 7.8 10*3/uL (ref 4.0–10.5)
nRBC: 0 % (ref 0.0–0.2)

## 2019-11-13 LAB — BASIC METABOLIC PANEL
Anion gap: 13 (ref 5–15)
BUN: 17 mg/dL (ref 8–23)
CO2: 31 mmol/L (ref 22–32)
Calcium: 9.3 mg/dL (ref 8.9–10.3)
Chloride: 99 mmol/L (ref 98–111)
Creatinine, Ser: 1.11 mg/dL (ref 0.61–1.24)
GFR calc Af Amer: >60 mL/min (ref >60)
GFR calc non Af Amer: >60 mL/min (ref >60)
Glucose, Bld: 95 mg/dL (ref 70–99)
Potassium: 4.2 mmol/L (ref 3.5–5.1)
Sodium: 143 mmol/L (ref 135–145)

## 2019-11-13 LAB — LEGIONELLA PNEUMOPHILA SEROGP 1 UR AG: L. pneumophila Serogp 1 Ur Ag: NEGATIVE

## 2019-11-13 LAB — MAGNESIUM: Magnesium: 2.1 mg/dL (ref 1.7–2.4)

## 2019-11-13 MED ORDER — PANTOPRAZOLE SODIUM 40 MG PO TBEC
40.0000 mg | DELAYED_RELEASE_TABLET | Freq: Two times a day (BID) | ORAL | Status: DC
  Administered 2019-11-13 – 2019-11-16 (×7): 40 mg via ORAL
  Filled 2019-11-13 (×7): qty 1

## 2019-11-13 MED ORDER — MORPHINE SULFATE (PF) 2 MG/ML IV SOLN
2.0000 mg | INTRAVENOUS | Status: DC | PRN
  Administered 2019-11-14 – 2019-11-15 (×2): 2 mg via INTRAVENOUS
  Filled 2019-11-13 (×2): qty 1

## 2019-11-13 NOTE — Progress Notes (Signed)
Progress Note   Subjective   Legs much better   Inpatient Medications    Scheduled Meds: . azithromycin  500 mg Oral Daily  . buPROPion  100 mg Oral BID  . diltiazem  120 mg Oral Daily  . furosemide  80 mg Intravenous BID  . methocarbamol  500 mg Oral BID  . metoprolol tartrate  75 mg Oral BID  . pantoprazole (PROTONIX) IV  40 mg Intravenous Q12H  . polyethylene glycol  17 g Oral Daily  . rOPINIRole  0.25 mg Oral BID  . senna-docusate  1 tablet Oral BID  . sodium chloride flush  3 mL Intravenous Q12H  . tamsulosin  0.4 mg Oral Daily  . Tdap  0.5 mL Intramuscular Once  . topiramate  25 mg Oral BID   Continuous Infusions: . cefTRIAXone (ROCEPHIN)  IV 1 g (11/12/19 0900)   PRN Meds: acetaminophen **OR** acetaminophen, albuterol, alum & mag hydroxide-simeth, bisacodyl, cyclobenzaprine, fluticasone, morphine injection, naLOXone (NARCAN)  injection, oxyCODONE-acetaminophen, sodium chloride, sodium chloride flush   Vital Signs    Vitals:   11/12/19 0800 11/12/19 1201 11/12/19 2136 11/13/19 0515  BP: 127/71 123/76 123/81 114/68  Pulse:  87 99 95  Resp: 20  15 18   Temp:  98.6 F (37 C) (!) 97.5 F (36.4 C) 97.8 F (36.6 C)  TempSrc:  Oral Oral   SpO2:  97%  97%  Weight:    112.6 kg  Height:        Intake/Output Summary (Last 24 hours) at 11/13/2019 0814 Last data filed at 11/13/2019 0502 Gross per 24 hour  Intake 340 ml  Output 1650 ml  Net -1310 ml   Filed Weights   11/11/19 0324 11/12/19 0506 11/13/19 0515  Weight: 121.7 kg 118.6 kg 112.6 kg    Telemetry    Afib, V rates 90s-110s - Personally Reviewed  Physical Exam   GEN- The patient is ill appearing, alert and oriented x 3 today.   Head- normocephalic, atraumatic Eyes-  Sclera clear, conjunctiva pink Ears- hearing intact Oropharynx- clear Neck- supple, Lungs-   normal work of breathing Heart- irregular rate and rhythm  GI- soft  Extremities- plus one edema improved  MS- age appropriate  atrophy Psych- euthymic mood, full affect Neuro- strength and sensation are intact   Labs    Chemistry Recent Labs  Lab 11/10/19 0303  11/11/19 0340 11/12/19 0221 11/13/19 0337  NA 141   < > 143 141 143  K 3.5   < > 3.5 3.4* 4.2  CL 105   < > 105 102 99  CO2 29  --  28 29 31   GLUCOSE 110*   < > 91 92 95  BUN 43*   < > 25* 16 17  CREATININE 1.32*   < > 1.01 0.99 1.11  CALCIUM 9.0  --  8.4* 8.6* 9.3  PROT 6.9  --   --  6.3*  --   ALBUMIN 3.3*  --   --  2.9*  --   AST 23  --   --  19  --   ALT 15  --   --  15  --   ALKPHOS 65  --   --  61  --   BILITOT 0.6  --   --  1.1  --   GFRNONAA 52*  --  >60 >60 >60  GFRAA 60*  --  >60 >60 >60  ANIONGAP 7  --  10 10 13    < > =  values in this interval not displayed.     Hematology Recent Labs  Lab 11/11/19 0340 11/11/19 1630 11/12/19 0221 11/13/19 0337  WBC 6.6  --  6.2 7.8  RBC 2.73*  --  2.94* 3.19*  HGB 7.0* 10.3* 7.8* 8.4*  HCT 23.5* 34.9* 24.5* 27.2*  MCV 86.1  --  83.3 85.3  MCH 25.6*  --  26.5 26.3  MCHC 29.8*  --  31.8 30.9  RDW 18.0*  --  17.3* 17.4*  PLT 220  --  217 242   Patient Profile:   [name] is a 78 y.o. male with a hx of permanent afib on Eliquis, obesity, severe AS (s/p TAVR 05/2018 at [facility]), PFO, HTN, HLD, CVA, non-compliance, chronic pain who is being seen today for the evaluation of afib RVR and heart failure in the setting of acute anemia at the request of Dr. Starla Link.  Assessment & Plan    1.  Permanent afib Rate improved with higher dose beta blocker no anticoagulation due to anemia  Normally on eliquis at home   2. Acute on chronic diastolic dysfunction Much improved over liter out continue diuresis   3. Anemia GI following  Hct 27.2 this am   Jenkins Rouge
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
## 2019-11-14 DIAGNOSIS — N3000 Acute cystitis without hematuria: Secondary | ICD-10-CM

## 2019-11-14 DIAGNOSIS — I5033 Acute on chronic diastolic (congestive) heart failure: Secondary | ICD-10-CM

## 2019-11-14 LAB — BASIC METABOLIC PANEL
Anion gap: 13 (ref 5–15)
BUN: 18 mg/dL (ref 8–23)
CO2: 30 mmol/L (ref 22–32)
Calcium: 9 mg/dL (ref 8.9–10.3)
Chloride: 96 mmol/L — ABNORMAL LOW (ref 98–111)
Creatinine, Ser: 1.18 mg/dL (ref 0.61–1.24)
GFR calc Af Amer: >60 mL/min (ref >60)
GFR calc non Af Amer: 59 mL/min — ABNORMAL LOW (ref >60)
Glucose, Bld: 114 mg/dL — ABNORMAL HIGH (ref 70–99)
Potassium: 3.1 mmol/L — ABNORMAL LOW (ref 3.5–5.1)
Sodium: 139 mmol/L (ref 135–145)

## 2019-11-14 LAB — CBC
HCT: 27.3 % — ABNORMAL LOW (ref 39.0–52.0)
Hemoglobin: 8.7 g/dL — ABNORMAL LOW (ref 13.0–17.0)
MCH: 26.4 pg (ref 26.0–34.0)
MCHC: 31.9 g/dL (ref 30.0–36.0)
MCV: 83 fL (ref 80.0–100.0)
Platelets: 244 10*3/uL (ref 150–400)
RBC: 3.29 MIL/uL — ABNORMAL LOW (ref 4.22–5.81)
RDW: 17.6 % — ABNORMAL HIGH (ref 11.5–15.5)
WBC: 8.1 10*3/uL (ref 4.0–10.5)
nRBC: 0 % (ref 0.0–0.2)

## 2019-11-14 MED ORDER — POTASSIUM CHLORIDE CRYS ER 20 MEQ PO TBCR
20.0000 meq | EXTENDED_RELEASE_TABLET | Freq: Once | ORAL | Status: AC
  Administered 2019-11-14: 20 meq via ORAL
  Filled 2019-11-14: qty 1

## 2019-11-14 MED ORDER — FUROSEMIDE 10 MG/ML IJ SOLN
40.0000 mg | Freq: Two times a day (BID) | INTRAMUSCULAR | Status: DC
  Administered 2019-11-14 – 2019-11-15 (×2): 40 mg via INTRAVENOUS
  Filled 2019-11-14 (×2): qty 4
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
## 2019-11-15 DIAGNOSIS — M79604 Pain in right leg: Secondary | ICD-10-CM

## 2019-11-15 LAB — CBC
HCT: 29.3 % — ABNORMAL LOW (ref 39.0–52.0)
Hemoglobin: 9 g/dL — ABNORMAL LOW (ref 13.0–17.0)
MCH: 25.9 pg — ABNORMAL LOW (ref 26.0–34.0)
MCHC: 30.7 g/dL (ref 30.0–36.0)
MCV: 84.4 fL (ref 80.0–100.0)
Platelets: 249 10*3/uL (ref 150–400)
RBC: 3.47 MIL/uL — ABNORMAL LOW (ref 4.22–5.81)
RDW: 18 % — ABNORMAL HIGH (ref 11.5–15.5)
WBC: 7.2 10*3/uL (ref 4.0–10.5)
nRBC: 0 % (ref 0.0–0.2)

## 2019-11-15 LAB — BASIC METABOLIC PANEL
Anion gap: 15 (ref 5–15)
BUN: 21 mg/dL (ref 8–23)
CO2: 29 mmol/L (ref 22–32)
Calcium: 9 mg/dL (ref 8.9–10.3)
Chloride: 95 mmol/L — ABNORMAL LOW (ref 98–111)
Creatinine, Ser: 1.17 mg/dL (ref 0.61–1.24)
GFR calc Af Amer: >60 mL/min (ref >60)
GFR calc non Af Amer: 60 mL/min — ABNORMAL LOW (ref >60)
Glucose, Bld: 83 mg/dL (ref 70–99)
Potassium: 3.3 mmol/L — ABNORMAL LOW (ref 3.5–5.1)
Sodium: 139 mmol/L (ref 135–145)

## 2019-11-15 LAB — MAGNESIUM: Magnesium: 2.1 mg/dL (ref 1.7–2.4)

## 2019-11-15 MED ORDER — DICLOFENAC SODIUM 1 % EX GEL
2.0000 g | Freq: Four times a day (QID) | CUTANEOUS | Status: DC
  Administered 2019-11-15 – 2019-11-16 (×6): 2 g via TOPICAL
  Filled 2019-11-15: qty 100

## 2019-11-15 MED ORDER — POTASSIUM CHLORIDE CRYS ER 20 MEQ PO TBCR
20.0000 meq | EXTENDED_RELEASE_TABLET | Freq: Once | ORAL | Status: AC
  Administered 2019-11-15: 20 meq via ORAL
  Filled 2019-11-15: qty 1

## 2019-11-15 MED ORDER — FUROSEMIDE 80 MG PO TABS
80.0000 mg | ORAL_TABLET | Freq: Two times a day (BID) | ORAL | Status: DC
  Administered 2019-11-15 – 2019-11-16 (×2): 80 mg via ORAL
  Filled 2019-11-15 (×2): qty 1

## 2019-11-15 NOTE — Progress Notes (Signed)
CSW attempted to call into patients room X2 to discuss discharge planning however no answer- will try again later.   Kingsley Spittle, LCSW [facility]  [phone]

## 2019-11-15 NOTE — NC FL2 (Signed)
[place] LEVEL OF CARE SCREENING TOOL     IDENTIFICATION  Patient Name: [name] Birthdate: 14-Jan-1941 Sex: male Admission Date (Current Location): 11/10/2019  [facility] and [place] Number:  Herbalist and Address:  [facility] [place], [place] [address] [place], [place] [address]      Provider Number: [id number]  Attending Physician Name and Address:  Harold Hedge, MD  Relative Name and Phone Number:       Current Level of Care: Hospital Recommended Level of Care: [place] Prior Approval Number:    Date Approved/Denied:   PASRR Number:   [id number]   Discharge Plan: SNF    Current Diagnoses: Patient Active Problem List   Diagnosis Date Noted  . Palliative care by specialist   . Goals of care, counseling/discussion   . UTI (urinary tract infection) 11/10/2019  . AF (paroxysmal atrial fibrillation) ([place]) 11/10/2019  . Acute blood loss anemia 11/10/2019  . Cellulitis 11/10/2019  . AKI (acute kidney injury) ([place]) 11/10/2019  . History of CVA (cerebrovascular accident) 11/10/2019  . Abdominal pain 10/07/2019  . Pressure injury of skin 10/07/2019  . Atrial fibrillation with rapid ventricular response ([place]) 10/06/2019  . Acute encephalopathy   . GI bleeding 10/13/2018  . Acute metabolic encephalopathy [id number]  . Acute renal failure (ARF) ([place]) 10/13/2018  . S/P TAVR (transcatheter aortic valve replacement) 09/20/2018  . Permanent atrial fibrillation ([place]) 09/20/2018  . Acute on chronic diastolic CHF (congestive heart failure) ([place]) 09/18/2018  . Heme positive stool 11/20/2017  . Gait disorder 03/24/2013  . Cervical post-laminectomy syndrome 03/26/2012  . Depression 03/26/2012  . Degenerative arthritis of shoulder region 03/26/2012  . KNEE REPLACEMENT, BILATERAL, HX OF 06/04/2007  . CELLULITIS/ABSCESS, ARM 05/05/2007  . Anxiety state 04/08/2007  . PAIN, CHRONIC NEC 04/08/2007  . OSTEOARTHROSIS,  GENERALIZED, MULTIPLE SITES 04/08/2007  . SPONDYLOLISTHESIS 04/08/2007  . INCONTINENCE, URGE 04/08/2007  . HYPERCHOLESTEROLEMIA 03/07/2007  . HYPERTENSION, BENIGN 03/07/2007    Orientation RESPIRATION BLADDER Height & Weight     Self, Time, Situation, Place  Normal External catheter Weight: 240 lb 1.3 oz (108.9 kg) Height:  5\' 9"  (175.3 cm)  BEHAVIORAL SYMPTOMS/MOOD NEUROLOGICAL BOWEL NUTRITION STATUS      Continent Diet(See dc summary)  AMBULATORY STATUS COMMUNICATION OF NEEDS Skin   Limited Assist Verbally Normal                       Personal Care Assistance Level of Assistance  Bathing, Feeding, Dressing Bathing Assistance: Limited assistance Feeding assistance: Independent Dressing Assistance: Limited assistance     Functional Limitations Info             SPECIAL CARE FACTORS FREQUENCY  PT (By licensed PT), OT (By licensed OT)     PT Frequency: 5 OT Frequency: 5            Contractures      Additional Factors Info  Code Status, Allergies Code Status Info: Full Allergies Info: Lorazepam Duloxetine Pregabalin Zanaflex (Tizanidine Hcl)           Current Medications (11/15/2019):  This is the current hospital active medication list Current Facility-Administered Medications  Medication Dose Route Frequency Provider Last Rate Last Dose  . acetaminophen (TYLENOL) tablet 650 mg  650 mg Oral Q6H PRN Fuller Plan A, MD       Or  . acetaminophen (TYLENOL) suppository 650 mg  650 mg Rectal Q6H PRN Tamala Julian, Rondell A,  MD      . albuterol (PROVENTIL) (2.5 MG/3ML) 0.083% nebulizer solution 2.5 mg  2.5 mg Nebulization Q6H PRN Smith, Rondell A, MD      . alum & mag hydroxide-simeth (MAALOX/MYLANTA) 200-200-20 MG/5ML suspension 30 mL  30 mL Oral Q4H PRN Aline August, MD   30 mL at 11/11/19 1839  . bisacodyl (DULCOLAX) suppository 10 mg  10 mg Rectal Daily PRN Aline August, MD      . buPROPion [facility]) tablet 100 mg  100 mg Oral BID Aline August, MD    100 mg at 11/15/19 0929  . cyclobenzaprine (FLEXERIL) tablet 5 mg  5 mg Oral BID PRN Arby Barrette A, NP   5 mg at 11/14/19 1442  . diclofenac Sodium (VOLTAREN) 1 % topical gel 2 g  2 g Topical QID Harold Hedge, MD   2 g at 11/15/19 1148  . diltiazem (CARDIZEM CD) 24 hr capsule 120 mg  120 mg Oral Daily Starla Link, Kshitiz, MD   120 mg at 11/15/19 0918  . fluticasone (FLONASE) 50 MCG/ACT nasal spray 1 spray  1 spray Each Nare Daily PRN Smith, Rondell A, MD      . furosemide (LASIX) tablet 80 mg  80 mg Oral BID Skeet Latch, MD      . methocarbamol (ROBAXIN) tablet 500 mg  500 mg Oral BID Aline August, MD   500 mg at 11/15/19 0919  . metoprolol tartrate (LOPRESSOR) tablet 75 mg  75 mg Oral BID Thompson Grayer, MD   75 mg at 11/15/19 0918  . morphine 2 MG/ML injection 2 mg  2 mg Intravenous Q4H PRN Harold Hedge, MD   2 mg at 11/14/19 1603  . naloxone [facility]) injection 0.4 mg  0.4 mg Intravenous PRN Fuller Plan A, MD   0.4 mg at 11/10/19 1057  . oxyCODONE-acetaminophen (PERCOCET/ROXICET) 5-325 MG per tablet 1 tablet  1 tablet Oral Q6H PRN [name], MD   1 tablet at 11/15/19 1147  . pantoprazole (PROTONIX) EC tablet 40 mg  40 mg Oral BID Harold Hedge, MD   40 mg at 11/15/19 0920  . polyethylene glycol (MIRALAX / GLYCOLAX) packet 17 g  17 g Oral Daily Starla Link, Kshitiz, MD   17 g at 11/14/19 1018  . rOPINIRole (REQUIP) tablet 0.25 mg  0.25 mg Oral BID Aline August, MD   0.25 mg at 11/15/19 0929  . senna-docusate (Senokot-S) tablet 1 tablet  1 tablet Oral BID Aline August, MD   1 tablet at 11/15/19 0919  . sodium chloride (OCEAN) 0.65 % nasal spray 1 spray  1 spray Each Nare PRN Aline August, MD   1 spray at 11/11/19 1841  . sodium chloride flush (NS) 0.9 % injection 3 mL  3 mL Intravenous Q12H Smith, Rondell A, MD   3 mL at 11/15/19 0923  . sodium chloride flush (NS) 0.9 % injection 9 mL  9 mL Intravenous PRN Hollace Hayward K, NP      . tamsulosin (FLOMAX) capsule 0.4 mg  0.4  mg Oral Daily Tamala Julian, Rondell A, MD   0.4 mg at 11/15/19 0919  . Tdap (BOOSTRIX) injection 0.5 mL  0.5 mL Intramuscular Once Fuller Plan A, MD      . topiramate (TOPAMAX) tablet 25 mg  25 mg Oral BID Aline August, MD   25 mg at 11/15/19 0919     Discharge Medications: Please see discharge summary for a list of discharge medications.  Relevant Imaging Results:  Relevant  Lab Results:   Additional Information [id number]  Weston Anna, LCSW
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
## 2019-11-16 LAB — BASIC METABOLIC PANEL
Anion gap: 14 (ref 5–15)
BUN: 24 mg/dL — ABNORMAL HIGH (ref 8–23)
CO2: 27 mmol/L (ref 22–32)
Calcium: 9 mg/dL (ref 8.9–10.3)
Chloride: 95 mmol/L — ABNORMAL LOW (ref 98–111)
Creatinine, Ser: 1.21 mg/dL (ref 0.61–1.24)
GFR calc Af Amer: >60 mL/min (ref >60)
GFR calc non Af Amer: 57 mL/min — ABNORMAL LOW (ref >60)
Glucose, Bld: 87 mg/dL (ref 70–99)
Potassium: 3.5 mmol/L (ref 3.5–5.1)
Sodium: 136 mmol/L (ref 135–145)

## 2019-11-16 LAB — CBC
HCT: 31.7 % — ABNORMAL LOW (ref 39.0–52.0)
Hemoglobin: 9.8 g/dL — ABNORMAL LOW (ref 13.0–17.0)
MCH: 25.9 pg — ABNORMAL LOW (ref 26.0–34.0)
MCHC: 30.9 g/dL (ref 30.0–36.0)
MCV: 83.6 fL (ref 80.0–100.0)
Platelets: 275 10*3/uL (ref 150–400)
RBC: 3.79 MIL/uL — ABNORMAL LOW (ref 4.22–5.81)
RDW: 17.8 % — ABNORMAL HIGH (ref 11.5–15.5)
WBC: 7.5 10*3/uL (ref 4.0–10.5)
nRBC: 0 % (ref 0.0–0.2)

## 2019-11-16 MED ORDER — METOPROLOL TARTRATE 75 MG PO TABS: 75.0000 mg | ORAL_TABLET | Freq: Two times a day (BID) | ORAL | 0 refills | Status: DC

## 2019-11-16 MED ORDER — FUROSEMIDE 80 MG PO TABS: 80.0000 mg | ORAL_TABLET | Freq: Two times a day (BID) | ORAL | 0 refills | Status: DC

## 2019-11-16 MED ORDER — DICLOFENAC SODIUM 1 % EX GEL: 2.0000 g | Freq: Four times a day (QID) | CUTANEOUS | 0 refills | Status: DC

## 2019-11-16 MED ORDER — OXYCODONE-ACETAMINOPHEN 5-325 MG PO TABS: 1.0000 | ORAL_TABLET | Freq: Four times a day (QID) | ORAL | 0 refills | Status: AC | PRN

## 2019-11-16 MED ORDER — SENNOSIDES-DOCUSATE SODIUM 8.6-50 MG PO TABS: 1.0000 | ORAL_TABLET | Freq: Two times a day (BID) | ORAL | 0 refills | Status: AC

## 2019-11-16 NOTE — Plan of Care (Signed)
Pt has met plan of care goals. He is adequate for discharge
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
## 2019-11-16 NOTE — Progress Notes (Signed)
No legal gaurdian sister aware
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
## 2019-11-16 NOTE — Care Management Important Message (Signed)
Important Message  Patient Details  Name: [name] MRN: [id number] Date of Birth: [birth year]   Medicare Important Message Given:  Yes     [name] 11/16/2019, 4:00 PM
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
## 2019-11-16 NOTE — Progress Notes (Signed)
Patient was stable at discharge. I removed their IVs. We reviewed the discharge education. Patient verbalized understanding and had no further questions. Patient left with belongings in hand.

## 2019-11-16 NOTE — TOC Transition Note (Addendum)
Transition of Care [facility]) - CM/SW Discharge Note   Patient Details  Name: [name] MRN: [id number] Date of Birth: 1941/07/12  Transition of Care [facility]) CM/SW Contact:  Sharin Mons, RN Phone Number: 11/16/2019, 3:02 PM   Clinical Narrative:   Presented s/p fall , admitted with Anemia/ GI Bleed.. hospital course a.fib/CHF. Hx of  atrial fibrillation/Eliquis, chronic pain syndrome, PFO, severe aortic stenosis status post aortic valve replacement,  CVA, encephalopathy.  Pt will transition to home today with home health services to follow. SOC within 48 hrs. States lives with sister [name] who is his caregiver. Pt states has no DME needs. Declines 3 in 1 per PT's recommendation.  [name] (Sister)     [phone]      Sister to provide transportation to home.  Final next level of care: [place] Barriers to Discharge: No Barriers Identified   Patient Goals and CMS Choice Patient states their goals for this hospitalization and ongoing recovery are:: to go home and get better CMS Medicare.gov Compare Post Acute Care list provided to:: Patient Choice offered to / list presented to : Patient  Discharge Placement                       Discharge Plan and Services                DME Arranged: N/A DME Agency: NA       HH Arranged: RN, PT, OT, Nurse's Aide [place] Agency: [place] ([place]) Date HH Agency Contacted: 11/16/19 Time [place]: [place] Representative spoke with at [place]: [place] ([place]) Interventions     Readmission Risk Interventions No flowsheet data found.

## 2019-12-28 ENCOUNTER — Other Ambulatory Visit: Payer: Self-pay | Admitting: Family Medicine

## 2019-12-29 ENCOUNTER — Other Ambulatory Visit: Payer: Self-pay | Admitting: Family Medicine

## 2019-12-29 DIAGNOSIS — S199XXA Unspecified injury of neck, initial encounter: Secondary | ICD-10-CM

## 2019-12-31 ENCOUNTER — Ambulatory Visit
Admission: RE | Admit: 2019-12-31 | Discharge: 2019-12-31 | Disposition: A | Discharge status: Home / Self Care | Payer: Medicare Other | Source: Ambulatory Visit | Attending: Family Medicine | Admitting: Family Medicine

## 2019-12-31 DIAGNOSIS — S199XXA Unspecified injury of neck, initial encounter: Secondary | ICD-10-CM

## 2020-05-10 ENCOUNTER — Other Ambulatory Visit (HOSPITAL_COMMUNITY): Payer: Self-pay | Admitting: Otolaryngology

## 2020-05-10 ENCOUNTER — Other Ambulatory Visit: Payer: Self-pay | Admitting: Otolaryngology

## 2020-05-10 DIAGNOSIS — R1312 Dysphagia, oropharyngeal phase: Secondary | ICD-10-CM

## 2020-05-17 ENCOUNTER — Ambulatory Visit (HOSPITAL_COMMUNITY)
Admission: RE | Admit: 2020-05-17 | Discharge: 2020-05-17 | Disposition: A | Discharge status: Home / Self Care | Payer: Medicare Other | Source: Ambulatory Visit | Attending: Otolaryngology | Admitting: Otolaryngology

## 2020-05-17 ENCOUNTER — Other Ambulatory Visit: Payer: Self-pay

## 2020-05-17 DIAGNOSIS — R1312 Dysphagia, oropharyngeal phase: Secondary | ICD-10-CM | POA: Diagnosis not present

## 2020-05-18 ENCOUNTER — Other Ambulatory Visit (HOSPITAL_COMMUNITY): Payer: Medicare Other

## 2020-05-18 ENCOUNTER — Ambulatory Visit (HOSPITAL_COMMUNITY): Payer: No Typology Code available for payment source

## 2020-06-13 ENCOUNTER — Other Ambulatory Visit: Payer: Self-pay

## 2020-06-13 ENCOUNTER — Other Ambulatory Visit: Payer: Self-pay | Admitting: Family Medicine

## 2020-06-13 ENCOUNTER — Ambulatory Visit
Admission: RE | Admit: 2020-06-13 | Discharge: 2020-06-13 | Disposition: A | Discharge status: Home / Self Care | Payer: Medicare Other | Source: Ambulatory Visit | Attending: Family Medicine | Admitting: Family Medicine

## 2020-06-13 DIAGNOSIS — M542 Cervicalgia: Secondary | ICD-10-CM

## 2020-09-23 ENCOUNTER — Other Ambulatory Visit: Payer: Self-pay | Admitting: Nurse Practitioner

## 2020-09-23 DIAGNOSIS — W11XXXA Fall on and from ladder, initial encounter: Secondary | ICD-10-CM

## 2020-10-10 ENCOUNTER — Other Ambulatory Visit: Payer: Medicare Other

## 2021-10-19 ENCOUNTER — Emergency Department (HOSPITAL_COMMUNITY)
Admission: EM | Admit: 2021-10-19 | Discharge: 2021-10-19 | Disposition: A | Discharge status: Home / Self Care | Payer: No Typology Code available for payment source | Attending: Emergency Medicine | Admitting: Emergency Medicine

## 2021-10-19 ENCOUNTER — Encounter (HOSPITAL_COMMUNITY): Payer: Self-pay

## 2021-10-19 ENCOUNTER — Emergency Department (HOSPITAL_COMMUNITY): Payer: No Typology Code available for payment source

## 2021-10-19 ENCOUNTER — Other Ambulatory Visit: Payer: Self-pay

## 2021-10-19 DIAGNOSIS — M542 Cervicalgia: Secondary | ICD-10-CM | POA: Insufficient documentation

## 2021-10-19 DIAGNOSIS — I4891 Unspecified atrial fibrillation: Secondary | ICD-10-CM | POA: Insufficient documentation

## 2021-10-19 DIAGNOSIS — Z7982 Long term (current) use of aspirin: Secondary | ICD-10-CM | POA: Diagnosis not present

## 2021-10-19 DIAGNOSIS — I10 Essential (primary) hypertension: Secondary | ICD-10-CM | POA: Diagnosis not present

## 2021-10-19 DIAGNOSIS — Z7901 Long term (current) use of anticoagulants: Secondary | ICD-10-CM | POA: Diagnosis not present

## 2021-10-19 DIAGNOSIS — Z96653 Presence of artificial knee joint, bilateral: Secondary | ICD-10-CM | POA: Diagnosis not present

## 2021-10-19 DIAGNOSIS — Z79899 Other long term (current) drug therapy: Secondary | ICD-10-CM | POA: Insufficient documentation

## 2021-10-19 DIAGNOSIS — Z859 Personal history of malignant neoplasm, unspecified: Secondary | ICD-10-CM | POA: Insufficient documentation

## 2021-10-19 DIAGNOSIS — Z87891 Personal history of nicotine dependence: Secondary | ICD-10-CM | POA: Diagnosis not present

## 2021-10-19 LAB — CBC WITH DIFFERENTIAL/PLATELET
Abs Immature Granulocytes: 0.01 10*3/uL (ref 0.00–0.07)
Basophils Absolute: 0 10*3/uL (ref 0.0–0.1)
Basophils Relative: 1 %
Eosinophils Absolute: 0.4 10*3/uL (ref 0.0–0.5)
Eosinophils Relative: 6 %
HCT: 42.6 % (ref 39.0–52.0)
Hemoglobin: 13.6 g/dL (ref 13.0–17.0)
Immature Granulocytes: 0 %
Lymphocytes Relative: 21 %
Lymphs Abs: 1.4 10*3/uL (ref 0.7–4.0)
MCH: 31.3 pg (ref 26.0–34.0)
MCHC: 31.9 g/dL (ref 30.0–36.0)
MCV: 97.9 fL (ref 80.0–100.0)
Monocytes Absolute: 0.7 10*3/uL (ref 0.1–1.0)
Monocytes Relative: 11 %
Neutro Abs: 4.1 10*3/uL (ref 1.7–7.7)
Neutrophils Relative %: 61 %
Platelets: 182 10*3/uL (ref 150–400)
RBC: 4.35 MIL/uL (ref 4.22–5.81)
RDW: 13.8 % (ref 11.5–15.5)
WBC: 6.7 10*3/uL (ref 4.0–10.5)
nRBC: 0 % (ref 0.0–0.2)

## 2021-10-19 LAB — BASIC METABOLIC PANEL
Anion gap: 7 (ref 5–15)
BUN: 27 mg/dL — ABNORMAL HIGH (ref 8–23)
CO2: 23 mmol/L (ref 22–32)
Calcium: 9.3 mg/dL (ref 8.9–10.3)
Chloride: 104 mmol/L (ref 98–111)
Creatinine, Ser: 1.1 mg/dL (ref 0.61–1.24)
GFR, Estimated: >60 mL/min (ref >60)
Glucose, Bld: 108 mg/dL — ABNORMAL HIGH (ref 70–99)
Potassium: 4.5 mmol/L (ref 3.5–5.1)
Sodium: 134 mmol/L — ABNORMAL LOW (ref 135–145)

## 2021-10-19 MED ORDER — KETOROLAC TROMETHAMINE 30 MG/ML IJ SOLN
15.0000 mg | Freq: Once | INTRAMUSCULAR | Status: AC
  Administered 2021-10-19: 15 mg via INTRAVENOUS
  Filled 2021-10-19: qty 1

## 2021-10-19 MED ORDER — IOHEXOL 350 MG/ML SOLN
60.0000 mL | Freq: Once | INTRAVENOUS | Status: AC | PRN
  Administered 2021-10-19: 60 mL via INTRAVENOUS

## 2021-10-19 NOTE — ED Notes (Signed)
Pt transported for CT 

## 2021-10-19 NOTE — ED Provider Notes (Signed)
[facility] EMERGENCY DEPARTMENT Provider Note  CSN: [id number] Arrival date & time: 10/19/21 1038    History Chief Complaint  Patient presents with   Neck Pain    [name] is a 80 y.o. male with history of chronic neck pain, s/p multiple fusion surgeries also has history of afib and TAVR on Eliquis. He reports several months of worsening neck pain, described as a sharp pain that feels like a razor blade is cutting his throat on the right from the back to the front when he turns his head to the left. He has been seen by his Neurosurgery clinic (8/31, had xrays and MRI done earlier in the summer without acute abnormalities) and referred to ENT who did not find any structural problems (seen 9/29). He reports continued pain, worse with movement and walking. No numbness, tingling, weakness or incontinence. He and sister (with whom he lives) are frustrated that no one has given them a definitive answer for the cause of his pain. He is taking Oxycodone 10mg  5/day and Robaxin for his pain.    Past Medical History:  Diagnosis Date   Cancer [facility])    Carpal tunnel syndrome    Cervical spondylosis with myelopathy    Chronic pain syndrome    Depression    Difficult intubation    needs smaller tube; awake oral fiberoptic scope 8.0 ETT 03/30/08   Dysphagia, pharyngeal phase    Hemorrhage of gastrointestinal tract, unspecified    History of cardiac cath    a. 03/2018 Cath: LAD and LCX with mild luminal irregularities.  No significant disease.   Iron deficiency anemia, unspecified    Lumbosacral spondylosis without myelopathy    Muscle weakness (generalized)    Other and unspecified disc disorder of cervical region    Other and unspecified hyperlipidemia    Patent foramen ovale    a. 05/2018 Echo: post-op TAVR--> + bublble study w/ L->R shunt.   Permanent atrial fibrillation ([place] )    a.  Diagnosed in the spring 2019.  Had rapid atrial fibrillation following TAVR and has been rate controlled  with beta-blocker.  CHA2DS2VASc equals 7.  Supposed to be on Eliquis.   Primary localized osteoarthrosis, lower leg    Primary localized osteoarthrosis, shoulder region    Reflux esophagitis    Severe aortic stenosis    a. mild-mod AS by 01/2016 TTE, restricted AV opening with mod AR 01/2016 TEE; b. 10/2017 Echo: EF 60-65%, sev Ca2+ AoV, mean grad 27mmHg; c. 05/2018 s/p TAVR ([facility]); d. 06/2018 Echo: EF 55-60%, well-seated prosth AoV, peak velocity 156cm/s. AoV gradient 38mmHg.   Spinal stenosis, unspecified region other than cervical    Stroke ([place])    01/2016   Syncope and collapse    Unspecified arthropathy, lower leg    Unspecified constipation    Unspecified essential hypertension    Unspecified glaucoma(365.9)     Past Surgical History:  Procedure Laterality Date   BILATERAL CATARACT SURGERY  2009   DR GROAT    BIOPSY  10/14/2018   Procedure: BIOPSY;  Surgeon: Wonda Horner, MD;  Location: [facility] ENDOSCOPY;  Service: Endoscopy;;   CARDIAC VALVE REPLACEMENT  05/2018   CERVIACAL SPINE (4X)     DR MARK ROY    COLONOSCOPY  2007   DR HENSEL    COLONOSCOPY WITH PROPOFOL N/A 11/20/2017   Procedure: COLONOSCOPY WITH PROPOFOL;  Surgeon: Wilford Corner, MD;  Location: [facility] ENDOSCOPY;  Service: Endoscopy;  Laterality: N/A;   ESOPHAGOGASTRODUODENOSCOPY (EGD) WITH  PROPOFOL N/A 10/14/2018   Procedure: ESOPHAGOGASTRODUODENOSCOPY (EGD) WITH PROPOFOL;  Surgeon: Wonda Horner, MD;  Location: [facility] ENDOSCOPY;  Service: Endoscopy;  Laterality: N/A;   JOINT REPLACEMENT     both knees   LEFT KNEE REPLACEMENT     DR ALUSIO   LEFT TRANSVERSE CARPAL LIGAMENT  01/06/2008   ROTATOR CUFF LEFT SHOULDER  2001   DR MURPHY    SHOULDER OPEN ROTATOR CUFF REPAIR  2006   DR [facility]   SPINE SURGERY     neck fusion    Family History  Problem Relation Age of Onset   Cancer Mother 58       cervical cancer   Cancer Father 28       lung  cancer    Social History   Tobacco Use   Smoking status: Former     Types: Cigarettes    Quit date: 08/29/1985    Years since quitting: 36.1   Smokeless tobacco: Former    Quit date: 02/12/1985  Vaping Use   Vaping Use: Never used  Substance Use Topics   Alcohol use: No   Drug use: No     Home Medications Prior to Admission medications   Medication Sig Start Date End Date Taking? Authorizing Provider  apixaban (ELIQUIS) 5 MG TABS tablet Take 1 tablet (5 mg total) by mouth 2 (two) times daily. 10/20/18   Thurnell Lose, MD  aspirin EC 81 MG tablet Take 81 mg by mouth daily.    [provider]  Janne Lab Oil [facility]) OINT Apply 1 application topically 2 (two) times daily. 09/02/19   [provider]  buPROPion (WELLBUTRIN) 100 MG tablet Take 100 mg by mouth 2 (two) times daily.    [provider]  Dextran 70-Hypromellose (ARTIFICIAL TEARS PF OP) Place 1-2 drops into both eyes 3 (three) times daily as needed (for dryness).    [provider]  diclofenac Sodium (VOLTAREN) 1 % GEL Apply 2 g topically 4 (four) times daily. 11/16/19   Harold Hedge, MD  diltiazem (CARDIZEM CD) 120 MG 24 hr capsule Take 1 capsule (120 mg total) by mouth daily. 10/09/19   Hosie Poisson, MD  fluticasone (FLONASE) 50 MCG/ACT nasal spray Place 1 spray into both nostrils daily as needed for allergies or rhinitis.  07/30/18   [provider]  furosemide (LASIX) 80 MG tablet Take 1 tablet (80 mg total) by mouth 2 (two) times daily. 11/16/19 12/16/19  Harold Hedge, MD  methocarbamol (ROBAXIN) 500 MG tablet Take 500 mg by mouth 2 (two) times daily. 08/19/18   [provider]  metoprolol tartrate 75 MG TABS Take 75 mg by mouth 2 (two) times daily. 11/16/19 12/16/19  Harold Hedge, MD  pantoprazole (PROTONIX) 40 MG tablet Take 1 tablet (40 mg total) by mouth 2 (two) times daily. 10/16/18   Thurnell Lose, MD  polyethylene glycol (MIRALAX / GLYCOLAX) packet Take 17 g by mouth daily.    [provider]  potassium  chloride (K-DUR,KLOR-CON) 20 MEQ tablet Take 1 tablet (20 mEq total) by mouth daily. Patient taking differently: Take 20 mEq by mouth at bedtime.  09/20/18   Strader, Fransisco Hertz, PA-C  rOPINIRole (REQUIP) 0.25 MG tablet Take 0.25 mg by mouth 2 (two) times daily.  10/09/18   [provider]  simvastatin (ZOCOR) 40 MG tablet Take 40 mg by mouth at bedtime.    [provider]  tamsulosin (FLOMAX) 0.4 MG CAPS capsule Take 0.4 mg  by mouth daily. 09/02/19   [provider]  topiramate (TOPAMAX) 25 MG tablet Take 25 mg by mouth 2 (two) times daily. 09/21/18   [provider]     Allergies    Lorazepam, Duloxetine, Pregabalin, and Zanaflex [tizanidine hcl]   Review of Systems   Review of Systems A comprehensive review of systems was completed and negative except as noted in HPI.    Physical Exam BP 105/74   Pulse 70   Temp 98.3 F (36.8 C) (Oral)   Resp 20   Ht 5\' 9"  (1.753 m)   Wt 108.9 kg   SpO2 100%   BMI 35.45 kg/m   Physical Exam Vitals and nursing note reviewed.  Constitutional:      Appearance: Normal appearance.  HENT:     Head: Normocephalic and atraumatic.     Nose: Nose normal.     Mouth/Throat:     Mouth: Mucous membranes are moist.     Pharynx: No oropharyngeal exudate or posterior oropharyngeal erythema.  Eyes:     Extraocular Movements: Extraocular movements intact.     Conjunctiva/sclera: Conjunctivae normal.  Neck:     Comments: No mass, no soft tissue tenderness, spasm of cervical paraspinal muscles Cardiovascular:     Rate and Rhythm: Normal rate.  Pulmonary:     Effort: Pulmonary effort is normal.     Breath sounds: Normal breath sounds.  Abdominal:     General: Abdomen is flat.     Palpations: Abdomen is soft.     Tenderness: There is no abdominal tenderness.  Musculoskeletal:        General: No swelling. Normal range of motion.     Cervical back: Neck supple.  Skin:    General: Skin is warm and dry.   Neurological:     General: No focal deficit present.     Mental Status: He is alert and oriented to person, place, and time.     Cranial Nerves: No cranial nerve deficit.     Sensory: No sensory deficit.     Motor: No weakness.     Gait: Gait normal.  Psychiatric:        Mood and Affect: Mood normal.     ED Results / Procedures / Treatments   Labs (all labs ordered are listed, but only abnormal results are displayed) Labs Reviewed  BASIC METABOLIC PANEL - Abnormal; Notable for the following components:      Result Value   Sodium 134 (*)    Glucose, Bld 108 (*)    BUN 27 (*)    All other components within normal limits  CBC WITH DIFFERENTIAL/PLATELET    EKG None  Radiology CT Soft Tissue Neck W Contrast  Result Date: 10/19/2021 CLINICAL DATA:  Right-sided neck pain EXAM: CT NECK WITH CONTRAST TECHNIQUE: Multidetector CT imaging of the neck was performed using the standard protocol following the bolus administration of intravenous contrast. CONTRAST:  1mL OMNIPAQUE IOHEXOL 350 MG/ML SOLN COMPARISON:  None. FINDINGS: PHARYNX AND LARYNX: The nasopharynx, oropharynx and larynx are normal. Visible portions of the oral cavity, tongue base and floor of mouth are normal. Normal epiglottis, vallecula and pyriform sinuses. The larynx is normal. No retropharyngeal abscess, effusion or lymphadenopathy. SALIVARY GLANDS: Normal parotid, submandibular and sublingual glands. THYROID: Normal. LYMPH NODES: No enlarged or abnormal density lymph nodes. VASCULAR: Mild aortic and carotid bifurcation atherosclerosis LIMITED INTRACRANIAL: Normal VISUALIZED ORBITS: Normal MASTOIDS AND VISUALIZED PARANASAL SINUSES: No fluid levels or advanced mucosal thickening. No mastoid effusion.  SKELETON: There is posterior cervical fusion hardware from the occiput put to T1. There is C6-7 ACDF. UPPER CHEST: Clear. OTHER: None. IMPRESSION: 1. No acute abnormality of the neck. 2. Long segment posterior cervical  arthrodesis. No adverse features. Aortic Atherosclerosis (ICD10-I70.0). Electronically Signed   By: Ulyses Jarred M.D.   On: 10/19/2021 21:39    Procedures Procedures  Medications Ordered in the ED Medications  ketorolac (TORADOL) 30 MG/ML injection 15 mg (15 mg Intravenous Given 10/19/21 2155)  iohexol (OMNIPAQUE) 350 MG/ML injection 60 mL (60 mLs Intravenous Contrast Given 10/19/21 2014)     MDM Rules/Calculators/A&P MDM  Patient with chronic neck pain, worsening with an unusual description of pain radiating into his R anterior neck. He has not had any advanced imaging in several months. He has also been told his 'kidneys are failing' but he does not understand what that has to do with his neck pain. Will check basic labs and send for CT neck with contrast, to eval any soft tissue problems aside from his known spine disease.  ED Course  I have reviewed the triage vital signs and the nursing notes.  Pertinent labs & imaging results that were available during my care of the patient were reviewed by me and considered in my medical decision making (see chart for details).  Clinical Course as of 10/19/21 2223  Thu Oct 19, 2021  1821 CBC is normal.  [CS]  1837 BMP is unremarkable. Normal Cr.  [CS]  2220 Reviewed lab and CT findings with the patient. No definite cause of his symptoms was found. He is not satisfied with the response he has gotten from his current neurosurgery clinic and would like to be referred to a clinic in [place] for a second opinion.  [CS]    Clinical Course User Index [CS] Truddie Hidden, MD    Final Clinical Impression(s) / ED Diagnoses Final diagnoses:  Neck pain    Rx / DC Orders ED Discharge Orders     None        Truddie Hidden, MD 10/19/21 2223

## 2021-10-19 NOTE — ED Triage Notes (Signed)
Pt presents with c/o neck pain. Pt reports he went to see an ENT and had some tests done. Pt reports that the neck pain has gotten worse and caused some issues with walking. Pt uses a cane. Pt does have a hx of neck surgery.

## 2021-11-03 ENCOUNTER — Emergency Department (HOSPITAL_COMMUNITY): Payer: No Typology Code available for payment source

## 2021-11-03 ENCOUNTER — Encounter (HOSPITAL_COMMUNITY): Payer: Self-pay

## 2021-11-03 ENCOUNTER — Other Ambulatory Visit: Payer: Self-pay

## 2021-11-03 ENCOUNTER — Observation Stay (HOSPITAL_COMMUNITY)
Admission: EM | Admit: 2021-11-03 | Discharge: 2021-11-06 | Disposition: A | Discharge status: Home / Self Care | Payer: No Typology Code available for payment source | Attending: Internal Medicine | Admitting: Internal Medicine

## 2021-11-03 DIAGNOSIS — N281 Cyst of kidney, acquired: Secondary | ICD-10-CM | POA: Diagnosis not present

## 2021-11-03 DIAGNOSIS — Z87891 Personal history of nicotine dependence: Secondary | ICD-10-CM | POA: Insufficient documentation

## 2021-11-03 DIAGNOSIS — W19XXXA Unspecified fall, initial encounter: Secondary | ICD-10-CM | POA: Diagnosis present

## 2021-11-03 DIAGNOSIS — Z96643 Presence of artificial hip joint, bilateral: Secondary | ICD-10-CM | POA: Diagnosis not present

## 2021-11-03 DIAGNOSIS — Z7901 Long term (current) use of anticoagulants: Secondary | ICD-10-CM | POA: Insufficient documentation

## 2021-11-03 DIAGNOSIS — Z79899 Other long term (current) drug therapy: Secondary | ICD-10-CM | POA: Diagnosis not present

## 2021-11-03 DIAGNOSIS — N179 Acute kidney failure, unspecified: Secondary | ICD-10-CM | POA: Diagnosis not present

## 2021-11-03 DIAGNOSIS — I110 Hypertensive heart disease with heart failure: Secondary | ICD-10-CM | POA: Diagnosis not present

## 2021-11-03 DIAGNOSIS — I5032 Chronic diastolic (congestive) heart failure: Secondary | ICD-10-CM | POA: Diagnosis not present

## 2021-11-03 DIAGNOSIS — I480 Paroxysmal atrial fibrillation: Secondary | ICD-10-CM | POA: Diagnosis present

## 2021-11-03 DIAGNOSIS — I4821 Permanent atrial fibrillation: Secondary | ICD-10-CM | POA: Diagnosis not present

## 2021-11-03 DIAGNOSIS — S0181XA Laceration without foreign body of other part of head, initial encounter: Principal | ICD-10-CM

## 2021-11-03 DIAGNOSIS — I959 Hypotension, unspecified: Secondary | ICD-10-CM | POA: Diagnosis not present

## 2021-11-03 DIAGNOSIS — E875 Hyperkalemia: Secondary | ICD-10-CM

## 2021-11-03 DIAGNOSIS — Z20822 Contact with and (suspected) exposure to covid-19: Secondary | ICD-10-CM | POA: Insufficient documentation

## 2021-11-03 LAB — I-STAT CHEM 8, ED
BUN: 26 mg/dL — ABNORMAL HIGH (ref 8–23)
Calcium, Ion: 1.11 mmol/L — ABNORMAL LOW (ref 1.15–1.40)
Chloride: 103 mmol/L (ref 98–111)
Creatinine, Ser: 1.3 mg/dL — ABNORMAL HIGH (ref 0.61–1.24)
Glucose, Bld: 101 mg/dL — ABNORMAL HIGH (ref 70–99)
HCT: 47 % (ref 39.0–52.0)
Hemoglobin: 16 g/dL (ref 13.0–17.0)
Potassium: 5.2 mmol/L — ABNORMAL HIGH (ref 3.5–5.1)
Sodium: 139 mmol/L (ref 135–145)
TCO2: 28 mmol/L (ref 22–32)

## 2021-11-03 LAB — CBC
HCT: 44.3 % (ref 39.0–52.0)
HCT: 39.6 % (ref 39.0–52.0)
Hemoglobin: 14.7 g/dL (ref 13.0–17.0)
Hemoglobin: 12.9 g/dL — ABNORMAL LOW (ref 13.0–17.0)
MCH: 31.7 pg (ref 26.0–34.0)
MCH: 31.2 pg (ref 26.0–34.0)
MCHC: 33.2 g/dL (ref 30.0–36.0)
MCHC: 32.6 g/dL (ref 30.0–36.0)
MCV: 95.5 fL (ref 80.0–100.0)
MCV: 95.9 fL (ref 80.0–100.0)
Platelets: 151 10*3/uL (ref 150–400)
Platelets: 192 10*3/uL (ref 150–400)
RBC: 4.64 MIL/uL (ref 4.22–5.81)
RBC: 4.13 MIL/uL — ABNORMAL LOW (ref 4.22–5.81)
RDW: 14 % (ref 11.5–15.5)
RDW: 13.8 % (ref 11.5–15.5)
WBC: 8.4 10*3/uL (ref 4.0–10.5)
WBC: 8.7 10*3/uL (ref 4.0–10.5)
nRBC: 0 % (ref 0.0–0.2)
nRBC: 0 % (ref 0.0–0.2)

## 2021-11-03 LAB — SAMPLE TO BLOOD BANK

## 2021-11-03 MED ORDER — SODIUM CHLORIDE 0.9 % IV BOLUS
1000.0000 mL | Freq: Once | INTRAVENOUS | Status: AC
  Administered 2021-11-03: 1000 mL via INTRAVENOUS

## 2021-11-03 MED ORDER — LIDOCAINE-EPINEPHRINE (PF) 2 %-1:200000 IJ SOLN
10.0000 mL | Freq: Once | INTRAMUSCULAR | Status: AC
  Administered 2021-11-03: 10 mL
  Filled 2021-11-03: qty 20

## 2021-11-03 MED ORDER — IOHEXOL 350 MG/ML SOLN
100.0000 mL | Freq: Once | INTRAVENOUS | Status: AC | PRN
  Administered 2021-11-03: 100 mL via INTRAVENOUS

## 2021-11-03 NOTE — ED Notes (Signed)
Patient transported to CT 

## 2021-11-03 NOTE — ED Triage Notes (Signed)
Pt bib EMS for a fall on thinner, unknown down time but pt reports it was at least 2 hrs before family arrived, pt was standing and hit his head on the table, there is a 3-4 inch lac above his right eye, bleeding is  controlled with gauze, pt is a/ox4 on arrival

## 2021-11-03 NOTE — Consult Note (Signed)
Activation and Reason: Level 2  HPI: [name] is an 80 y.o. male hx HTN, HLD, afib (on Eliquis), aortic stenosis, syncope, spinal stenosis, whom was brought to the emergency department following a fall from standing.  There was a significant amount of bleeding noted on scene.  He was transported here.  He underwent evaluation and work-up in the emergency department.  During the course of the work-up, he did have a systolic blood pressure on point of 89 that improved after the first 100 cc of a bolus.  We were asked to see him as well.  He denies loss of consciousness.  He does not feel that he passed out or had a syncopal type event per se but does note a history of this in the past.  He is here with his wife.  Currently, reports some soreness about his head laceration that has since been repaired by the ER provider.  He denies any chest or abdominal pain.  He denies any shortness of breath.  He denies any significant weakness.  He does report he was ambulatory after the fall.  Past Medical History:  Diagnosis Date   Cancer [facility])    Carpal tunnel syndrome    Cervical spondylosis with myelopathy    Chronic pain syndrome    Depression    Difficult intubation    needs smaller tube; awake oral fiberoptic scope 8.0 ETT 03/30/08   Dysphagia, pharyngeal phase    Hemorrhage of gastrointestinal tract, unspecified    History of cardiac cath    a. 03/2018 Cath: LAD and LCX with mild luminal irregularities.  No significant disease.   Iron deficiency anemia, unspecified    Lumbosacral spondylosis without myelopathy    Muscle weakness (generalized)    Other and unspecified disc disorder of cervical region    Other and unspecified hyperlipidemia    Patent foramen ovale    a. 05/2018 Echo: post-op TAVR--> + bublble study w/ L->R shunt.   Permanent atrial fibrillation ([place])    a.  Diagnosed in the spring 2019.  Had rapid atrial fibrillation following TAVR and has been rate controlled with  beta-blocker.  CHA2DS2VASc equals 7.  Supposed to be on Eliquis.   Primary localized osteoarthrosis, lower leg    Primary localized osteoarthrosis, shoulder region    Reflux esophagitis    Severe aortic stenosis    a. mild-mod AS by 01/2016 TTE, restricted AV opening with mod AR 01/2016 TEE; b. 10/2017 Echo: EF 60-65%, sev Ca2+ AoV, mean grad 31mmHg; c. 05/2018 s/p TAVR ([facility]); d. 06/2018 Echo: EF 55-60%, well-seated prosth AoV, peak velocity 156cm/s. AoV gradient 37mmHg.   Spinal stenosis, unspecified region other than cervical    Stroke ([place])    01/2016   Syncope and collapse    Unspecified arthropathy, lower leg    Unspecified constipation    Unspecified essential hypertension    Unspecified glaucoma(365.9)     Past Surgical History:  Procedure Laterality Date   BILATERAL CATARACT SURGERY  2009   DR GROAT    BIOPSY  10/14/2018   Procedure: BIOPSY;  Surgeon: Wonda Horner, MD;  Location: [facility] ENDOSCOPY;  Service: Endoscopy;;   CARDIAC VALVE REPLACEMENT  05/2018   CERVIACAL SPINE (4X)     DR MARK ROY    COLONOSCOPY  2007   DR HENSEL    COLONOSCOPY WITH PROPOFOL N/A 11/20/2017   Procedure: COLONOSCOPY WITH PROPOFOL;  Surgeon: Wilford Corner, MD;  Location: [facility] ENDOSCOPY;  Service: Endoscopy;  Laterality: N/A;  ESOPHAGOGASTRODUODENOSCOPY (EGD) WITH PROPOFOL N/A 10/14/2018   Procedure: ESOPHAGOGASTRODUODENOSCOPY (EGD) WITH PROPOFOL;  Surgeon: Wonda Horner, MD;  Location: [facility] ENDOSCOPY;  Service: Endoscopy;  Laterality: N/A;   JOINT REPLACEMENT     both knees   LEFT KNEE REPLACEMENT     DR ALUSIO   LEFT TRANSVERSE CARPAL LIGAMENT  01/06/2008   ROTATOR CUFF LEFT SHOULDER  2001   DR MURPHY    SHOULDER OPEN ROTATOR CUFF REPAIR  2006   DR [facility]   SPINE SURGERY     neck fusion    Family History  Problem Relation Age of Onset   Cancer Mother 15       cervical cancer   Cancer Father 26       lung  cancer    Social:  reports that he quit smoking about 36 years ago. He quit  smokeless tobacco use about 36 years ago. He reports that he does not drink alcohol and does not use drugs.  Allergies:  Allergies  Allergen Reactions   Lorazepam Other (See Comments)    Delirium    Duloxetine Other (See Comments)    Increased depression, irritability   Pregabalin Swelling and Other (See Comments)    Flashbacks, Bad dreams, Aggressive behavior, swelling   Zanaflex [Tizanidine Hcl] Anxiety    Medications: I have reviewed the patient's current medications.  Results for orders placed or performed during the hospital encounter of 11/03/21 (from the past 48 hour(s))  CBC     Status: None   Collection Time: 11/03/21  3:35 PM  Result Value Ref Range   WBC 8.4 4.0 - 10.5 K/uL   RBC 4.64 4.22 - 5.81 MIL/uL   Hemoglobin 14.7 13.0 - 17.0 g/dL   HCT 44.3 39.0 - 52.0 %   MCV 95.5 80.0 - 100.0 fL   MCH 31.7 26.0 - 34.0 pg   MCHC 33.2 30.0 - 36.0 g/dL   RDW 14.0 11.5 - 15.5 %   Platelets 151 150 - 400 K/uL    Comment: REPEATED TO VERIFY   nRBC 0.0 0.0 - 0.2 %    Comment: Performed at [facility], [place] [address], [place], [place] [address]  Sample to Blood Bank     Status: None   Collection Time: 11/03/21  3:35 PM  Result Value Ref Range   Blood Bank Specimen SAMPLE AVAILABLE FOR TESTING    Sample Expiration      11/04/2021,2359 Performed at [facility], [place] [address] [place], [place] [address]   I-stat chem 8, ED     Status: Abnormal   Collection Time: 11/03/21  3:48 PM  Result Value Ref Range   Sodium 139 135 - 145 mmol/L   Potassium 5.2 (H) 3.5 - 5.1 mmol/L   Chloride 103 98 - 111 mmol/L   BUN 26 (H) 8 - 23 mg/dL   Creatinine, Ser 1.30 (H) 0.61 - 1.24 mg/dL   Glucose, Bld 101 (H) 70 - 99 mg/dL    Comment: Glucose reference range applies only to samples taken after fasting for at least 8 hours.   Calcium, Ion 1.11 (L) 1.15 - 1.40 mmol/L   TCO2 28 22 - 32 mmol/L   Hemoglobin 16.0 13.0 - 17.0 g/dL   HCT 47.0 39.0 - 52.0 %  CBC     Status:  Abnormal   Collection Time: 11/03/21  7:40 PM  Result Value Ref Range   WBC 8.7 4.0 - 10.5 K/uL   RBC 4.13 (L)  4.22 - 5.81 MIL/uL   Hemoglobin 12.9 (L) 13.0 - 17.0 g/dL   HCT 39.6 39.0 - 52.0 %   MCV 95.9 80.0 - 100.0 fL   MCH 31.2 26.0 - 34.0 pg   MCHC 32.6 30.0 - 36.0 g/dL   RDW 13.8 11.5 - 15.5 %   Platelets 192 150 - 400 K/uL   nRBC 0.0 0.0 - 0.2 %    Comment: Performed at [facility], [place] [address], [place],  [address]    CT HEAD WO CONTRAST (5MM)  Result Date: 11/03/2021 CLINICAL DATA:  80 year old male with history of head trauma after falling out of bed. EXAM: CT HEAD WITHOUT CONTRAST CT CERVICAL SPINE WITHOUT CONTRAST TECHNIQUE: Multidetector CT imaging of the head and cervical spine was performed following the standard protocol without intravenous contrast. Multiplanar CT image reconstructions of the cervical spine were also generated. COMPARISON:  Cervical spine CT scan 12/31/2019. Brain MRI 11/11/2019. Head CT 11/10/2019. Moderate cerebral atrophy. FINDINGS: CT HEAD FINDINGS Brain: Moderate cerebral atrophy. Patchy and confluent areas of decreased attenuation are noted throughout the deep and periventricular Jaana Brodt matter of the cerebral hemispheres bilaterally, compatible with chronic microvascular ischemic disease. Chronic encephalomalacia in the inferomedial aspect of the right cerebellar hemisphere, similar to prior examinations. No evidence of acute infarction, hemorrhage, hydrocephalus, extra-axial collection or mass lesion/mass effect. Vascular: No hyperdense vessel or unexpected calcification. Skull: Orthopedic fixation hardware in the occipital bone. No acute displaced skull fractures. Sinuses/Orbits: No acute finding. Other: Extensive soft tissue swelling and gas in the soft tissues of the right frontal scalp, presumably from scalp contusion/laceration. CT CERVICAL SPINE FINDINGS Comment: Today's study is limited by considerable artifact from indwelling spinal  hardware. Alignment: 2 mm of anterolisthesis of C2 upon C3 and 5 mm of anterolisthesis of C6 upon C7 both appear to be chronic. Alignment is otherwise anatomic. Skull base and vertebrae: Rod and screw fixation hardware extending from the occipital bone to the level of C7 (occipital bone to C3 and C4-C7). Status post ACDF at C6-C7. Complete bony fusion of the facet joints throughout the cervical spine. Complete bony fusion of C6-C7. No acute displaced fractures are identified. Soft tissues and spinal canal: No prevertebral fluid or swelling. No visible canal hematoma. Disc levels: Severe multilevel degenerative disc disease throughout the cervical spine and visualized upper thoracic spine, with complete bony fusion at C6-C7. Complete bony fusion of the facet joints throughout the cervical spine. Upper chest: Unremarkable. Other: None. IMPRESSION: 1. No evidence of significant acute traumatic injury to the skull, brain or cervical spine. 2. Large right frontal scalp contusion/laceration. 3. Moderate cerebral atrophy with chronic microvascular ischemic changes in cerebral Michio Thier matter and chronic encephalomalacia in the right cerebellar hemisphere, similar to prior examinations, as above. 4. Postoperative changes and chronic degenerative findings in the cervical spine, as above. Electronically Signed   By: Vinnie Langton M.D.   On: 11/03/2021 16:18   CT Cervical Spine Wo Contrast  Result Date: 11/03/2021 CLINICAL DATA:  80 year old male with history of head trauma after falling out of bed. EXAM: CT HEAD WITHOUT CONTRAST CT CERVICAL SPINE WITHOUT CONTRAST TECHNIQUE: Multidetector CT imaging of the head and cervical spine was performed following the standard protocol without intravenous contrast. Multiplanar CT image reconstructions of the cervical spine were also generated. COMPARISON:  Cervical spine CT scan 12/31/2019. Brain MRI 11/11/2019. Head CT 11/10/2019. Moderate cerebral atrophy. FINDINGS: CT HEAD  FINDINGS Brain: Moderate cerebral atrophy. Patchy and confluent areas of decreased attenuation are noted  throughout the deep and periventricular Raine Elsass matter of the cerebral hemispheres bilaterally, compatible with chronic microvascular ischemic disease. Chronic encephalomalacia in the inferomedial aspect of the right cerebellar hemisphere, similar to prior examinations. No evidence of acute infarction, hemorrhage, hydrocephalus, extra-axial collection or mass lesion/mass effect. Vascular: No hyperdense vessel or unexpected calcification. Skull: Orthopedic fixation hardware in the occipital bone. No acute displaced skull fractures. Sinuses/Orbits: No acute finding. Other: Extensive soft tissue swelling and gas in the soft tissues of the right frontal scalp, presumably from scalp contusion/laceration. CT CERVICAL SPINE FINDINGS Comment: Today's study is limited by considerable artifact from indwelling spinal hardware. Alignment: 2 mm of anterolisthesis of C2 upon C3 and 5 mm of anterolisthesis of C6 upon C7 both appear to be chronic. Alignment is otherwise anatomic. Skull base and vertebrae: Rod and screw fixation hardware extending from the occipital bone to the level of C7 (occipital bone to C3 and C4-C7). Status post ACDF at C6-C7. Complete bony fusion of the facet joints throughout the cervical spine. Complete bony fusion of C6-C7. No acute displaced fractures are identified. Soft tissues and spinal canal: No prevertebral fluid or swelling. No visible canal hematoma. Disc levels: Severe multilevel degenerative disc disease throughout the cervical spine and visualized upper thoracic spine, with complete bony fusion at C6-C7. Complete bony fusion of the facet joints throughout the cervical spine. Upper chest: Unremarkable. Other: None. IMPRESSION: 1. No evidence of significant acute traumatic injury to the skull, brain or cervical spine. 2. Large right frontal scalp contusion/laceration. 3. Moderate cerebral  atrophy with chronic microvascular ischemic changes in cerebral Glena Pharris matter and chronic encephalomalacia in the right cerebellar hemisphere, similar to prior examinations, as above. 4. Postoperative changes and chronic degenerative findings in the cervical spine, as above. Electronically Signed   By: Vinnie Langton M.D.   On: 11/03/2021 16:18   DG Pelvis Portable  Result Date: 11/03/2021 CLINICAL DATA:  Status post fall. EXAM: PORTABLE PELVIS 1-2 VIEWS COMPARISON:  None. FINDINGS: There is no evidence of pelvic fracture or diastasis. No pelvic bone lesions are seen. IMPRESSION: Negative. Electronically Signed   By: Virgina Norfolk M.D.   On: 11/03/2021 15:53   CT CHEST ABDOMEN PELVIS W CONTRAST  Result Date: 11/03/2021 CLINICAL DATA:  Abdominal trauma.  Status post fall EXAM: CT CHEST, ABDOMEN, AND PELVIS WITH CONTRAST TECHNIQUE: Multidetector CT imaging of the chest, abdomen and pelvis was performed following the standard protocol during bolus administration of intravenous contrast. CONTRAST:  162mL OMNIPAQUE IOHEXOL 350 MG/ML SOLN COMPARISON:  None. FINDINGS: CHEST: Ports and Devices: None. Lungs/airways: No focal consolidation. Punctate calcified pulmonary micronodules. No pulmonary mass. No pulmonary contusion or laceration. No pneumatocele formation. The central airways are patent. Mild diffuse bronchial wall thickening. Pleura: No pleural effusion. No pneumothorax. No hemothorax. Lymph Nodes: No mediastinal, hilar, or axillary lymphadenopathy. Mediastinum: No pneumomediastinum. No aortic injury or mediastinal hematoma. The thoracic aorta is normal in caliber. Status post aortic valve replacement. Atherosclerotic plaque. Enlarged left atrium. No significant pericardial effusion. The main pulmonary artery is normal in caliber. No central pulmonary embolus. The esophagus is unremarkable. The thyroid is unremarkable. Chest Wall / Breasts: No chest wall mass.  Bilateral gynecomastia. Musculoskeletal:  No acute rib or sternal fracture. Old healed left rib fractures. No spinal fracture. Multilevel severe degenerative changes of the spine. Lower cervical spine surgical hardware-please see separately dictated CT cervical spine 11/03/2021. T10-T11 posterolateral fusion surgical hardware. ABDOMEN / PELVIS: Liver: Not enlarged. No focal lesion. No laceration or subcapsular hematoma. Biliary System: The  gallbladder is otherwise unremarkable with no radio-opaque gallstones. No biliary ductal dilatation. Pancreas: Normal pancreatic contour. No main pancreatic duct dilatation. Spleen: Not enlarged. No focal lesion. No laceration, subcapsular hematoma, or vascular injury. Adrenal Glands: No nodularity bilaterally. Kidneys: Bilateral kidneys enhance symmetrically. No hydronephrosis. No contusion, laceration, or subcapsular hematoma. Punctate calcified stone within the right kidney. There is a 2.4 cm fluid density lesion within the right kidney that likely represents a simple renal cyst. Pericentimeter fluid density lesion within left kidney likely represents a simple renal cyst. Indeterminate slightly more conspicuous 1 cm left renal cyst with a density of 30 Hounsfield units. No injury to the vascular structures or collecting systems. No hydroureter. The urinary bladder is decompressed with circumferential urinary bladder wall thickening likely due to under distension. Urinary bladder diverticula noted. Bowel: Gastric surgical changes. No small or large bowel wall thickening or dilatation. The appendix is unremarkable. Mesentery, Omentum, and Peritoneum: No simple free fluid ascites. No pneumoperitoneum. No hemoperitoneum. No mesenteric hematoma identified. No organized fluid collection. Pelvic Organs: Normal. Lymph Nodes: No abdominal, pelvic, inguinal lymphadenopathy. Vasculature: Atherosclerotic plaque. No abdominal aorta or iliac aneurysm. No active contrast extravasation or pseudoaneurysm. Musculoskeletal: No  significant soft tissue hematoma. Small fat containing supraumbilical ventral hernias with an abdominal defect of 2 cm (7:63) as well as 1.1 cm (7:71). No acute pelvic fracture. No spinal fracture. Multilevel severe degenerative changes of the spine. IMPRESSION: 1. No acute traumatic injury to the chest, abdomen, or pelvis. 2. No acute fracture or traumatic malalignment of the thoracic or lumbar spine. Other imaging findings of potential clinical significance: 1. Indeterminate slightly more conspicuous 1 cm left renal cyst with a density of 30 Hounsfield units. Recommend MRI renal protocol for further evaluation. When the patient is clinically stable and able to follow directions and hold their breath (preferably as an outpatient) further evaluation with dedicated abdominal MRI should be considered. 2. Nonobstructive punctate right nephrolithiasis. 3. Fat containing supraumbilical ventral hernias. 4. The circumferential urinary bladder wall thickening likely due to under distension. Urinary bladder diverticula noted. 5. Left atrial enlargement. Electronically Signed   By: Iven Finn M.D.   On: 11/03/2021 20:59   DG Chest Portable 1 View  Result Date: 11/03/2021 CLINICAL DATA:  Status post fall. EXAM: PORTABLE CHEST 1 VIEW COMPARISON:  None. FINDINGS: The heart size and mediastinal contours are within normal limits. An artificial aortic valve is seen. Both lungs are clear. Postoperative changes are noted within the cervical spine and lower thoracic spine. Degenerative changes are also seen involving the thoracic spine and bilateral shoulders. IMPRESSION: No active disease. Electronically Signed   By: Virgina Norfolk M.D.   On: 11/03/2021 15:52   DG Hip Unilat W or Wo Pelvis 2-3 Views Right  Result Date: 11/03/2021 CLINICAL DATA:  Fall. EXAM: DG HIP (WITH OR WITHOUT PELVIS) 2-3V RIGHT COMPARISON:  None. FINDINGS: There is no acute fracture or dislocation. There are mild degenerative changes of the  right hip. There are vascular calcifications in the soft tissues. IMPRESSION: No acute bony abnormality. Electronically Signed   By: Ronney Asters M.D.   On: 11/03/2021 16:25    ROS - all of the below systems have been reviewed with the patient and positives are indicated with bold text General: chills, fever or night sweats Eyes: blurry vision or double vision ENT: epistaxis or sore throat Allergy/Immunology: itchy/watery eyes or nasal congestion Hematologic/Lymphatic: bleeding problems, blood clots or swollen lymph nodes Endocrine: temperature intolerance or unexpected weight changes Breast: new  or changing breast lumps or nipple discharge Resp: cough, shortness of breath, or wheezing CV: chest pain or dyspnea on exertion GI: as per HPI GU: dysuria, trouble voiding, or hematuria MSK: joint pain or joint stiffness Neuro: TIA or stroke symptoms Derm: pruritus and skin lesion changes Psych: anxiety and depression  PE Blood pressure (!) 90/54, pulse 79, temperature 98.1 F (36.7 C), temperature source Oral, resp. rate 11, height 5\' 9"  (1.753 m), weight 109 kg, SpO2 94 %. Physical Exam Constitutional: NAD; conversant; no deformities Eyes: Moist conjunctiva; no lid lag; anicteric; PERRL Neck: Trachea midline; no thyromegaly Lungs: Normal respiratory effort; CTAB; no tactile fremitus CV: irregular rate/rhythm; no palpable thrills; no pitting edema GI: Abd soft, NT/ND; no palpable hepatosplenomegaly MSK: Normal range of motion of extremities; no clubbing/cyanosis; no apparent deformities Psychiatric: Appropriate affect; alert and oriented x3 Lymphatic: No palpable cervical or axillary lymphadenopathy  Results for orders placed or performed during the hospital encounter of 11/03/21 (from the past 48 hour(s))  CBC     Status: None   Collection Time: 11/03/21  3:35 PM  Result Value Ref Range   WBC 8.4 4.0 - 10.5 K/uL   RBC 4.64 4.22 - 5.81 MIL/uL   Hemoglobin 14.7 13.0 - 17.0 g/dL    HCT 44.3 39.0 - 52.0 %   MCV 95.5 80.0 - 100.0 fL   MCH 31.7 26.0 - 34.0 pg   MCHC 33.2 30.0 - 36.0 g/dL   RDW 14.0 11.5 - 15.5 %   Platelets 151 150 - 400 K/uL    Comment: REPEATED TO VERIFY   nRBC 0.0 0.0 - 0.2 %    Comment: Performed at [facility], [place] [address] [place], [place] [address]  Sample to Blood Bank     Status: None   Collection Time: 11/03/21  3:35 PM  Result Value Ref Range   Blood Bank Specimen SAMPLE AVAILABLE FOR TESTING    Sample Expiration      11/04/2021,2359 Performed at [facility], [place] [address], [place],  [address]   I-stat chem 8, ED     Status: Abnormal   Collection Time: 11/03/21  3:48 PM  Result Value Ref Range   Sodium 139 135 - 145 mmol/L   Potassium 5.2 (H) 3.5 - 5.1 mmol/L   Chloride 103 98 - 111 mmol/L   BUN 26 (H) 8 - 23 mg/dL   Creatinine, Ser 1.30 (H) 0.61 - 1.24 mg/dL   Glucose, Bld 101 (H) 70 - 99 mg/dL    Comment: Glucose reference range applies only to samples taken after fasting for at least 8 hours.   Calcium, Ion 1.11 (L) 1.15 - 1.40 mmol/L   TCO2 28 22 - 32 mmol/L   Hemoglobin 16.0 13.0 - 17.0 g/dL   HCT 47.0 39.0 - 52.0 %  CBC     Status: Abnormal   Collection Time: 11/03/21  7:40 PM  Result Value Ref Range   WBC 8.7 4.0 - 10.5 K/uL   RBC 4.13 (L) 4.22 - 5.81 MIL/uL   Hemoglobin 12.9 (L) 13.0 - 17.0 g/dL   HCT 39.6 39.0 - 52.0 %   MCV 95.9 80.0 - 100.0 fL   MCH 31.2 26.0 - 34.0 pg   MCHC 32.6 30.0 - 36.0 g/dL   RDW 13.8 11.5 - 15.5 %   Platelets 192 150 - 400 K/uL   nRBC 0.0 0.0 - 0.2 %    Comment: Performed at [facility], [place]  [address], [place],  [address]    CT HEAD WO CONTRAST (5MM)  Result Date: 11/03/2021 CLINICAL DATA:  80 year old male with history of head trauma after falling out of bed. EXAM: CT HEAD WITHOUT CONTRAST CT CERVICAL SPINE WITHOUT CONTRAST TECHNIQUE: Multidetector CT imaging of the head and cervical spine was performed following the standard protocol without  intravenous contrast. Multiplanar CT image reconstructions of the cervical spine were also generated. COMPARISON:  Cervical spine CT scan 12/31/2019. Brain MRI 11/11/2019. Head CT 11/10/2019. Moderate cerebral atrophy. FINDINGS: CT HEAD FINDINGS Brain: Moderate cerebral atrophy. Patchy and confluent areas of decreased attenuation are noted throughout the deep and periventricular Zakkery Dorian matter of the cerebral hemispheres bilaterally, compatible with chronic microvascular ischemic disease. Chronic encephalomalacia in the inferomedial aspect of the right cerebellar hemisphere, similar to prior examinations. No evidence of acute infarction, hemorrhage, hydrocephalus, extra-axial collection or mass lesion/mass effect. Vascular: No hyperdense vessel or unexpected calcification. Skull: Orthopedic fixation hardware in the occipital bone. No acute displaced skull fractures. Sinuses/Orbits: No acute finding. Other: Extensive soft tissue swelling and gas in the soft tissues of the right frontal scalp, presumably from scalp contusion/laceration. CT CERVICAL SPINE FINDINGS Comment: Today's study is limited by considerable artifact from indwelling spinal hardware. Alignment: 2 mm of anterolisthesis of C2 upon C3 and 5 mm of anterolisthesis of C6 upon C7 both appear to be chronic. Alignment is otherwise anatomic. Skull base and vertebrae: Rod and screw fixation hardware extending from the occipital bone to the level of C7 (occipital bone to C3 and C4-C7). Status post ACDF at C6-C7. Complete bony fusion of the facet joints throughout the cervical spine. Complete bony fusion of C6-C7. No acute displaced fractures are identified. Soft tissues and spinal canal: No prevertebral fluid or swelling. No visible canal hematoma. Disc levels: Severe multilevel degenerative disc disease throughout the cervical spine and visualized upper thoracic spine, with complete bony fusion at C6-C7. Complete bony fusion of the facet joints throughout the  cervical spine. Upper chest: Unremarkable. Other: None. IMPRESSION: 1. No evidence of significant acute traumatic injury to the skull, brain or cervical spine. 2. Large right frontal scalp contusion/laceration. 3. Moderate cerebral atrophy with chronic microvascular ischemic changes in cerebral Samiksha Pellicano matter and chronic encephalomalacia in the right cerebellar hemisphere, similar to prior examinations, as above. 4. Postoperative changes and chronic degenerative findings in the cervical spine, as above. Electronically Signed   By: Vinnie Langton M.D.   On: 11/03/2021 16:18   CT Cervical Spine Wo Contrast  Result Date: 11/03/2021 CLINICAL DATA:  80 year old male with history of head trauma after falling out of bed. EXAM: CT HEAD WITHOUT CONTRAST CT CERVICAL SPINE WITHOUT CONTRAST TECHNIQUE: Multidetector CT imaging of the head and cervical spine was performed following the standard protocol without intravenous contrast. Multiplanar CT image reconstructions of the cervical spine were also generated. COMPARISON:  Cervical spine CT scan 12/31/2019. Brain MRI 11/11/2019. Head CT 11/10/2019. Moderate cerebral atrophy. FINDINGS: CT HEAD FINDINGS Brain: Moderate cerebral atrophy. Patchy and confluent areas of decreased attenuation are noted throughout the deep and periventricular Tavious Griesinger matter of the cerebral hemispheres bilaterally, compatible with chronic microvascular ischemic disease. Chronic encephalomalacia in the inferomedial aspect of the right cerebellar hemisphere, similar to prior examinations. No evidence of acute infarction, hemorrhage, hydrocephalus, extra-axial collection or mass lesion/mass effect. Vascular: No hyperdense vessel or unexpected calcification. Skull: Orthopedic fixation hardware in the occipital bone. No acute displaced skull fractures. Sinuses/Orbits: No acute finding. Other: Extensive soft tissue swelling and gas in the soft tissues  of the right frontal scalp, presumably from scalp  contusion/laceration. CT CERVICAL SPINE FINDINGS Comment: Today's study is limited by considerable artifact from indwelling spinal hardware. Alignment: 2 mm of anterolisthesis of C2 upon C3 and 5 mm of anterolisthesis of C6 upon C7 both appear to be chronic. Alignment is otherwise anatomic. Skull base and vertebrae: Rod and screw fixation hardware extending from the occipital bone to the level of C7 (occipital bone to C3 and C4-C7). Status post ACDF at C6-C7. Complete bony fusion of the facet joints throughout the cervical spine. Complete bony fusion of C6-C7. No acute displaced fractures are identified. Soft tissues and spinal canal: No prevertebral fluid or swelling. No visible canal hematoma. Disc levels: Severe multilevel degenerative disc disease throughout the cervical spine and visualized upper thoracic spine, with complete bony fusion at C6-C7. Complete bony fusion of the facet joints throughout the cervical spine. Upper chest: Unremarkable. Other: None. IMPRESSION: 1. No evidence of significant acute traumatic injury to the skull, brain or cervical spine. 2. Large right frontal scalp contusion/laceration. 3. Moderate cerebral atrophy with chronic microvascular ischemic changes in cerebral Ryin Schillo matter and chronic encephalomalacia in the right cerebellar hemisphere, similar to prior examinations, as above. 4. Postoperative changes and chronic degenerative findings in the cervical spine, as above. Electronically Signed   By: Vinnie Langton M.D.   On: 11/03/2021 16:18   DG Pelvis Portable  Result Date: 11/03/2021 CLINICAL DATA:  Status post fall. EXAM: PORTABLE PELVIS 1-2 VIEWS COMPARISON:  None. FINDINGS: There is no evidence of pelvic fracture or diastasis. No pelvic bone lesions are seen. IMPRESSION: Negative. Electronically Signed   By: Virgina Norfolk M.D.   On: 11/03/2021 15:53   CT CHEST ABDOMEN PELVIS W CONTRAST  Result Date: 11/03/2021 CLINICAL DATA:  Abdominal trauma.  Status post fall  EXAM: CT CHEST, ABDOMEN, AND PELVIS WITH CONTRAST TECHNIQUE: Multidetector CT imaging of the chest, abdomen and pelvis was performed following the standard protocol during bolus administration of intravenous contrast. CONTRAST:  169mL OMNIPAQUE IOHEXOL 350 MG/ML SOLN COMPARISON:  None. FINDINGS: CHEST: Ports and Devices: None. Lungs/airways: No focal consolidation. Punctate calcified pulmonary micronodules. No pulmonary mass. No pulmonary contusion or laceration. No pneumatocele formation. The central airways are patent. Mild diffuse bronchial wall thickening. Pleura: No pleural effusion. No pneumothorax. No hemothorax. Lymph Nodes: No mediastinal, hilar, or axillary lymphadenopathy. Mediastinum: No pneumomediastinum. No aortic injury or mediastinal hematoma. The thoracic aorta is normal in caliber. Status post aortic valve replacement. Atherosclerotic plaque. Enlarged left atrium. No significant pericardial effusion. The main pulmonary artery is normal in caliber. No central pulmonary embolus. The esophagus is unremarkable. The thyroid is unremarkable. Chest Wall / Breasts: No chest wall mass.  Bilateral gynecomastia. Musculoskeletal: No acute rib or sternal fracture. Old healed left rib fractures. No spinal fracture. Multilevel severe degenerative changes of the spine. Lower cervical spine surgical hardware-please see separately dictated CT cervical spine 11/03/2021. T10-T11 posterolateral fusion surgical hardware. ABDOMEN / PELVIS: Liver: Not enlarged. No focal lesion. No laceration or subcapsular hematoma. Biliary System: The gallbladder is otherwise unremarkable with no radio-opaque gallstones. No biliary ductal dilatation. Pancreas: Normal pancreatic contour. No main pancreatic duct dilatation. Spleen: Not enlarged. No focal lesion. No laceration, subcapsular hematoma, or vascular injury. Adrenal Glands: No nodularity bilaterally. Kidneys: Bilateral kidneys enhance symmetrically. No hydronephrosis. No  contusion, laceration, or subcapsular hematoma. Punctate calcified stone within the right kidney. There is a 2.4 cm fluid density lesion within the right kidney that likely represents a simple renal cyst. Pericentimeter fluid  density lesion within left kidney likely represents a simple renal cyst. Indeterminate slightly more conspicuous 1 cm left renal cyst with a density of 30 Hounsfield units. No injury to the vascular structures or collecting systems. No hydroureter. The urinary bladder is decompressed with circumferential urinary bladder wall thickening likely due to under distension. Urinary bladder diverticula noted. Bowel: Gastric surgical changes. No small or large bowel wall thickening or dilatation. The appendix is unremarkable. Mesentery, Omentum, and Peritoneum: No simple free fluid ascites. No pneumoperitoneum. No hemoperitoneum. No mesenteric hematoma identified. No organized fluid collection. Pelvic Organs: Normal. Lymph Nodes: No abdominal, pelvic, inguinal lymphadenopathy. Vasculature: Atherosclerotic plaque. No abdominal aorta or iliac aneurysm. No active contrast extravasation or pseudoaneurysm. Musculoskeletal: No significant soft tissue hematoma. Small fat containing supraumbilical ventral hernias with an abdominal defect of 2 cm (7:63) as well as 1.1 cm (7:71). No acute pelvic fracture. No spinal fracture. Multilevel severe degenerative changes of the spine. IMPRESSION: 1. No acute traumatic injury to the chest, abdomen, or pelvis. 2. No acute fracture or traumatic malalignment of the thoracic or lumbar spine. Other imaging findings of potential clinical significance: 1. Indeterminate slightly more conspicuous 1 cm left renal cyst with a density of 30 Hounsfield units. Recommend MRI renal protocol for further evaluation. When the patient is clinically stable and able to follow directions and hold their breath (preferably as an outpatient) further evaluation with dedicated abdominal MRI should  be considered. 2. Nonobstructive punctate right nephrolithiasis. 3. Fat containing supraumbilical ventral hernias. 4. The circumferential urinary bladder wall thickening likely due to under distension. Urinary bladder diverticula noted. 5. Left atrial enlargement. Electronically Signed   By: Iven Finn M.D.   On: 11/03/2021 20:59   DG Chest Portable 1 View  Result Date: 11/03/2021 CLINICAL DATA:  Status post fall. EXAM: PORTABLE CHEST 1 VIEW COMPARISON:  None. FINDINGS: The heart size and mediastinal contours are within normal limits. An artificial aortic valve is seen. Both lungs are clear. Postoperative changes are noted within the cervical spine and lower thoracic spine. Degenerative changes are also seen involving the thoracic spine and bilateral shoulders. IMPRESSION: No active disease. Electronically Signed   By: Virgina Norfolk M.D.   On: 11/03/2021 15:52   DG Hip Unilat W or Wo Pelvis 2-3 Views Right  Result Date: 11/03/2021 CLINICAL DATA:  Fall. EXAM: DG HIP (WITH OR WITHOUT PELVIS) 2-3V RIGHT COMPARISON:  None. FINDINGS: There is no acute fracture or dislocation. There are mild degenerative changes of the right hip. There are vascular calcifications in the soft tissues. IMPRESSION: No acute bony abnormality. Electronically Signed   By: Ronney Asters M.D.   On: 11/03/2021 16:25      Assessment/Plan: 80yoM with hx HTN, HLD, afib (on Eliquis), aortic stenosis, syncope, spinal stenosiss/p fall from standing  -CT A/P shows no evident intra-abdominal injury -Hypotension - likely 2/2 acute blood loss anemia; agree with crystalloid bolus, this appears to have resolved after fluid challenges Dispo-  plan for medicine admission for observation and potentially PT/OT evaluation; we will follow with you  Nadeen Landau, MD [facility] Use [email or web address] to contact on call provider

## 2021-11-03 NOTE — ED Notes (Signed)
Pt did not tolerate ambulation well. Spo2 dropped to 12% and bp systolic below 80

## 2021-11-03 NOTE — ED Provider Notes (Signed)
[facility] EMERGENCY DEPARTMENT Provider Note   CSN: [id number] Arrival date & time: 11/03/21  1528     History Chief Complaint  Patient presents with   [name] is a 80 y.o. male.   Fall Pertinent negatives include no chest pain and no abdominal pain. Patient came in as a level 2 trauma.  Fall on anticoagulation.  Ground-level fall.  Hit head.  Laceration to right forehead.  Has some neck pain but states is not different than his chronic neck pain.  Also some right hip pain.  Denies loss conscious.  No abdominal pain or chest pain.     Past Medical History:  Diagnosis Date   Cancer [facility])    Carpal tunnel syndrome    Cervical spondylosis with myelopathy    Chronic pain syndrome    Depression    Difficult intubation    needs smaller tube; awake oral fiberoptic scope 8.0 ETT 03/30/08   Dysphagia, pharyngeal phase    Hemorrhage of gastrointestinal tract, unspecified    History of cardiac cath    a. 03/2018 Cath: LAD and LCX with mild luminal irregularities.  No significant disease.   Iron deficiency anemia, unspecified    Lumbosacral spondylosis without myelopathy    Muscle weakness (generalized)    Other and unspecified disc disorder of cervical region    Other and unspecified hyperlipidemia    Patent foramen ovale    a. 05/2018 Echo: post-op TAVR--> + bublble study w/ L->R shunt.   Permanent atrial fibrillation ([place])    a.  Diagnosed in the spring 2019.  Had rapid atrial fibrillation following TAVR and has been rate controlled with beta-blocker.  CHA2DS2VASc equals 7.  Supposed to be on Eliquis.   Primary localized osteoarthrosis, lower leg    Primary localized osteoarthrosis, shoulder region    Reflux esophagitis    Severe aortic stenosis    a. mild-mod AS by 01/2016 TTE, restricted AV opening with mod AR 01/2016 TEE; b. 10/2017 Echo: EF 60-65%, sev Ca2+ AoV, mean grad 28mmHg; c. 05/2018 s/p TAVR ([facility]); d. 06/2018 Echo: EF 55-60%, well-seated  prosth AoV, peak velocity 156cm/s. AoV gradient 29mmHg.   Spinal stenosis, unspecified region other than cervical    Stroke ([place])    01/2016   Syncope and collapse    Unspecified arthropathy, lower leg    Unspecified constipation    Unspecified essential hypertension    Unspecified glaucoma(365.9)     Patient Active Problem List   Diagnosis Date Noted   Palliative care by specialist    Goals of care, counseling/discussion    UTI (urinary tract infection) 11/10/2019   AF (paroxysmal atrial fibrillation) ([place]) 11/10/2019   Acute blood loss anemia 11/10/2019   Cellulitis 11/10/2019   AKI (acute kidney injury) ([place]) 11/10/2019   History of CVA (cerebrovascular accident) 11/10/2019   Abdominal pain 10/07/2019   Pressure injury of skin 10/07/2019   Atrial fibrillation with rapid ventricular response ([place]) 10/06/2019   Acute encephalopathy    GI bleeding 37/62/8315   Acute metabolic encephalopathy 17/61/6073   Acute renal failure (ARF) ([place]) 10/13/2018   S/P TAVR (transcatheter aortic valve replacement) 09/20/2018   Permanent atrial fibrillation ([place]) 09/20/2018   Acute on chronic diastolic CHF (congestive heart failure) ([place]) 09/18/2018   Heme positive stool 11/20/2017   Gait disorder 03/24/2013   Cervical post-laminectomy syndrome 03/26/2012   Depression 03/26/2012   Degenerative arthritis of shoulder region 03/26/2012   KNEE REPLACEMENT, BILATERAL, HX OF  06/04/2007   CELLULITIS/ABSCESS, ARM 05/05/2007   Anxiety state 04/08/2007   PAIN, CHRONIC NEC 04/08/2007   OSTEOARTHROSIS, GENERALIZED, MULTIPLE SITES 04/08/2007   SPONDYLOLISTHESIS 04/08/2007   INCONTINENCE, URGE 04/08/2007   HYPERCHOLESTEROLEMIA 03/07/2007   HYPERTENSION, BENIGN 03/07/2007    Past Surgical History:  Procedure Laterality Date   BILATERAL CATARACT SURGERY  2009   DR GROAT    BIOPSY  10/14/2018   Procedure: BIOPSY;  Surgeon: Wonda Horner, MD;  Location: [place];  Service: Endoscopy;;   CARDIAC  VALVE REPLACEMENT  05/2018   CERVIACAL SPINE (4X)     DR MARK ROY    COLONOSCOPY  2007   DR HENSEL    COLONOSCOPY WITH PROPOFOL N/A 11/20/2017   Procedure: COLONOSCOPY WITH PROPOFOL;  Surgeon: Wilford Corner, MD;  Location: [place];  Service: Endoscopy;  Laterality: N/A;   ESOPHAGOGASTRODUODENOSCOPY (EGD) WITH PROPOFOL N/A 10/14/2018   Procedure: ESOPHAGOGASTRODUODENOSCOPY (EGD) WITH PROPOFOL;  Surgeon: Wonda Horner, MD;  Location[facility] ENDOSCOPY;  Service: Endoscopy;  Laterality: N/A;   JOINT REPLACEMENT     both knees   LEFT KNEE REPLACEMENT     DR ALUSIO   LEFT TRANSVERSE CARPAL LIGAMENT  01/06/2008   ROTATOR CUFF LEFT SHOULDER  2001   DR MURPHY    SHOULDER OPEN ROTATOR CUFF REPAIR  2006   DR [facility]   SPINE SURGERY     neck fusion       Family History  Problem Relation Age of Onset   Cancer Mother 65       cervical cancer   Cancer Father 63       lung  cancer    Social History   Tobacco Use   Smoking status: Former    Types: Cigarettes    Quit date: 08/29/1985    Years since quitting: 36.2   Smokeless tobacco: Former    Quit date: 02/12/1985  Vaping Use   Vaping Use: Never used  Substance Use Topics   Alcohol use: No   Drug use: No    Home Medications Prior to Admission medications   Medication Sig Start Date End Date Taking? Authorizing Provider  apixaban (ELIQUIS) 5 MG TABS tablet Take 1 tablet (5 mg total) by mouth 2 (two) times daily. 10/20/18   Thurnell Lose, MD  aspirin EC 81 MG tablet Take 81 mg by mouth daily.    [provider]  Janne Lab Oil [facility]) OINT Apply 1 application topically 2 (two) times daily. 09/02/19   [provider]  buPROPion (WELLBUTRIN) 100 MG tablet Take 100 mg by mouth 2 (two) times daily.    [provider]  Dextran 70-Hypromellose (ARTIFICIAL TEARS PF OP) Place 1-2 drops into both eyes 3 (three) times daily as needed (for dryness).    [provider]  diclofenac Sodium  (VOLTAREN) 1 % GEL Apply 2 g topically 4 (four) times daily. 11/16/19   Harold Hedge, MD  diltiazem (CARDIZEM CD) 120 MG 24 hr capsule Take 1 capsule (120 mg total) by mouth daily. 10/09/19   Hosie Poisson, MD  fluticasone (FLONASE) 50 MCG/ACT nasal spray Place 1 spray into both nostrils daily as needed for allergies or rhinitis.  07/30/18   [provider]  furosemide (LASIX) 80 MG tablet Take 1 tablet (80 mg total) by mouth 2 (two) times daily. 11/16/19 12/16/19  Harold Hedge, MD  methocarbamol (ROBAXIN) 500 MG tablet Take 500 mg by mouth 2 (two) times daily. 08/19/18   [provider]  metoprolol tartrate 75 MG TABS Take 75 mg by mouth 2 (two) times daily. 11/16/19 12/16/19  Harold Hedge, MD  pantoprazole (PROTONIX) 40 MG tablet Take 1 tablet (40 mg total) by mouth 2 (two) times daily. 10/16/18   Thurnell Lose, MD  polyethylene glycol (MIRALAX / GLYCOLAX) packet Take 17 g by mouth daily.    [provider]  potassium chloride (K-DUR,KLOR-CON) 20 MEQ tablet Take 1 tablet (20 mEq total) by mouth daily. Patient taking differently: Take 20 mEq by mouth at bedtime.  09/20/18   Strader, Fransisco Hertz, PA-C  rOPINIRole (REQUIP) 0.25 MG tablet Take 0.25 mg by mouth 2 (two) times daily.  10/09/18   [provider]  simvastatin (ZOCOR) 40 MG tablet Take 40 mg by mouth at bedtime.    [provider]  tamsulosin (FLOMAX) 0.4 MG CAPS capsule Take 0.4 mg by mouth daily. 09/02/19   [provider]  topiramate (TOPAMAX) 25 MG tablet Take 25 mg by mouth 2 (two) times daily. 09/21/18   [provider]    Allergies    Lorazepam, Duloxetine, Pregabalin, and Zanaflex [tizanidine hcl]  Review of Systems   Review of Systems  Constitutional:  Negative for appetite change.  HENT:  Negative for congestion.   Cardiovascular:  Negative for chest pain.  Gastrointestinal:  Negative for abdominal pain.  Genitourinary:  Negative for flank pain.   Musculoskeletal:  Positive for neck pain. Negative for back pain.  Neurological:  Negative for weakness.  Psychiatric/Behavioral:  Negative for confusion.    Physical Exam Updated Vital Signs BP 114/73   Pulse 89   Temp 98.1 F (36.7 C) (Oral)   Resp (!) 26   Ht 5\' 9"  (1.753 m)   Wt 109 kg   SpO2 98%   BMI 35.49 kg/m   Physical Exam Vitals and nursing note reviewed.  HENT:     Head:     Comments: Curved laceration approximately 6 cm right forehead.    Mouth/Throat:     Mouth: Mucous membranes are moist.  Eyes:     Pupils: Pupils are equal, round, and reactive to light.  Neck:     Comments: No midline tenderness no deformity.  Does complain of some pain.  Cervical collar refused. Cardiovascular:     Rate and Rhythm: Rhythm irregular.  Pulmonary:     Breath sounds: No wheezing or rhonchi.  Abdominal:     Tenderness: There is no abdominal tenderness.  Musculoskeletal:     Comments: Mild tenderness to right hip laterally.  Skin:    General: Skin is warm.     Capillary Refill: Capillary refill takes less than 2 seconds.  Neurological:     Mental Status: He is alert.    ED Results / Procedures / Treatments   Labs (all labs ordered are listed, but only abnormal results are displayed) Labs Reviewed  CBC - Abnormal; Notable for the following components:      Result Value   RBC 4.13 (*)    Hemoglobin 12.9 (*)    All other components within normal limits  I-STAT CHEM 8, ED - Abnormal; Notable for the following components:   Potassium 5.2 (*)    BUN 26 (*)    Creatinine, Ser 1.30 (*)    Glucose, Bld 101 (*)    Calcium, Ion 1.11 (*)    All other components within normal limits  CBC  SAMPLE TO BLOOD BANK    EKG None  Radiology CT HEAD WO CONTRAST (  5MM)  Result Date: 11/03/2021 CLINICAL DATA:  80 year old male with history of head trauma after falling out of bed. EXAM: CT HEAD WITHOUT CONTRAST CT CERVICAL SPINE WITHOUT CONTRAST TECHNIQUE: Multidetector CT  imaging of the head and cervical spine was performed following the standard protocol without intravenous contrast. Multiplanar CT image reconstructions of the cervical spine were also generated. COMPARISON:  Cervical spine CT scan 12/31/2019. Brain MRI 11/11/2019. Head CT 11/10/2019. Moderate cerebral atrophy. FINDINGS: CT HEAD FINDINGS Brain: Moderate cerebral atrophy. Patchy and confluent areas of decreased attenuation are noted throughout the deep and periventricular white matter of the cerebral hemispheres bilaterally, compatible with chronic microvascular ischemic disease. Chronic encephalomalacia in the inferomedial aspect of the right cerebellar hemisphere, similar to prior examinations. No evidence of acute infarction, hemorrhage, hydrocephalus, extra-axial collection or mass lesion/mass effect. Vascular: No hyperdense vessel or unexpected calcification. Skull: Orthopedic fixation hardware in the occipital bone. No acute displaced skull fractures. Sinuses/Orbits: No acute finding. Other: Extensive soft tissue swelling and gas in the soft tissues of the right frontal scalp, presumably from scalp contusion/laceration. CT CERVICAL SPINE FINDINGS Comment: Today's study is limited by considerable artifact from indwelling spinal hardware. Alignment: 2 mm of anterolisthesis of C2 upon C3 and 5 mm of anterolisthesis of C6 upon C7 both appear to be chronic. Alignment is otherwise anatomic. Skull base and vertebrae: Rod and screw fixation hardware extending from the occipital bone to the level of C7 (occipital bone to C3 and C4-C7). Status post ACDF at C6-C7. Complete bony fusion of the facet joints throughout the cervical spine. Complete bony fusion of C6-C7. No acute displaced fractures are identified. Soft tissues and spinal canal: No prevertebral fluid or swelling. No visible canal hematoma. Disc levels: Severe multilevel degenerative disc disease throughout the cervical spine and visualized upper thoracic spine,  with complete bony fusion at C6-C7. Complete bony fusion of the facet joints throughout the cervical spine. Upper chest: Unremarkable. Other: None. IMPRESSION: 1. No evidence of significant acute traumatic injury to the skull, brain or cervical spine. 2. Large right frontal scalp contusion/laceration. 3. Moderate cerebral atrophy with chronic microvascular ischemic changes in cerebral white matter and chronic encephalomalacia in the right cerebellar hemisphere, similar to prior examinations, as above. 4. Postoperative changes and chronic degenerative findings in the cervical spine, as above. Electronically Signed   By: Vinnie Langton M.D.   On: 11/03/2021 16:18   CT Cervical Spine Wo Contrast  Result Date: 11/03/2021 CLINICAL DATA:  80 year old male with history of head trauma after falling out of bed. EXAM: CT HEAD WITHOUT CONTRAST CT CERVICAL SPINE WITHOUT CONTRAST TECHNIQUE: Multidetector CT imaging of the head and cervical spine was performed following the standard protocol without intravenous contrast. Multiplanar CT image reconstructions of the cervical spine were also generated. COMPARISON:  Cervical spine CT scan 12/31/2019. Brain MRI 11/11/2019. Head CT 11/10/2019. Moderate cerebral atrophy. FINDINGS: CT HEAD FINDINGS Brain: Moderate cerebral atrophy. Patchy and confluent areas of decreased attenuation are noted throughout the deep and periventricular white matter of the cerebral hemispheres bilaterally, compatible with chronic microvascular ischemic disease. Chronic encephalomalacia in the inferomedial aspect of the right cerebellar hemisphere, similar to prior examinations. No evidence of acute infarction, hemorrhage, hydrocephalus, extra-axial collection or mass lesion/mass effect. Vascular: No hyperdense vessel or unexpected calcification. Skull: Orthopedic fixation hardware in the occipital bone. No acute displaced skull fractures. Sinuses/Orbits: No acute finding. Other: Extensive soft tissue  swelling and gas in the soft tissues of the right frontal scalp, presumably from scalp contusion/laceration. CT CERVICAL  SPINE FINDINGS Comment: Today's study is limited by considerable artifact from indwelling spinal hardware. Alignment: 2 mm of anterolisthesis of C2 upon C3 and 5 mm of anterolisthesis of C6 upon C7 both appear to be chronic. Alignment is otherwise anatomic. Skull base and vertebrae: Rod and screw fixation hardware extending from the occipital bone to the level of C7 (occipital bone to C3 and C4-C7). Status post ACDF at C6-C7. Complete bony fusion of the facet joints throughout the cervical spine. Complete bony fusion of C6-C7. No acute displaced fractures are identified. Soft tissues and spinal canal: No prevertebral fluid or swelling. No visible canal hematoma. Disc levels: Severe multilevel degenerative disc disease throughout the cervical spine and visualized upper thoracic spine, with complete bony fusion at C6-C7. Complete bony fusion of the facet joints throughout the cervical spine. Upper chest: Unremarkable. Other: None. IMPRESSION: 1. No evidence of significant acute traumatic injury to the skull, brain or cervical spine. 2. Large right frontal scalp contusion/laceration. 3. Moderate cerebral atrophy with chronic microvascular ischemic changes in cerebral white matter and chronic encephalomalacia in the right cerebellar hemisphere, similar to prior examinations, as above. 4. Postoperative changes and chronic degenerative findings in the cervical spine, as above. Electronically Signed   By: Vinnie Langton M.D.   On: 11/03/2021 16:18   DG Pelvis Portable  Result Date: 11/03/2021 CLINICAL DATA:  Status post fall. EXAM: PORTABLE PELVIS 1-2 VIEWS COMPARISON:  None. FINDINGS: There is no evidence of pelvic fracture or diastasis. No pelvic bone lesions are seen. IMPRESSION: Negative. Electronically Signed   By: Virgina Norfolk M.D.   On: 11/03/2021 15:53   DG Chest Portable 1  View  Result Date: 11/03/2021 CLINICAL DATA:  Status post fall. EXAM: PORTABLE CHEST 1 VIEW COMPARISON:  None. FINDINGS: The heart size and mediastinal contours are within normal limits. An artificial aortic valve is seen. Both lungs are clear. Postoperative changes are noted within the cervical spine and lower thoracic spine. Degenerative changes are also seen involving the thoracic spine and bilateral shoulders. IMPRESSION: No active disease. Electronically Signed   By: Virgina Norfolk M.D.   On: 11/03/2021 15:52   DG Hip Unilat W or Wo Pelvis 2-3 Views Right  Result Date: 11/03/2021 CLINICAL DATA:  Fall. EXAM: DG HIP (WITH OR WITHOUT PELVIS) 2-3V RIGHT COMPARISON:  None. FINDINGS: There is no acute fracture or dislocation. There are mild degenerative changes of the right hip. There are vascular calcifications in the soft tissues. IMPRESSION: No acute bony abnormality. Electronically Signed   By: Ronney Asters M.D.   On: 11/03/2021 16:25    Procedures .Marland KitchenLaceration Repair  Date/Time: 11/03/2021 11:22 PM Performed by: Davonna Belling, MD Authorized by: Davonna Belling, MD   Consent:    Consent obtained:  Verbal   Consent given by:  Patient   Risks, benefits, and alternatives were discussed: yes     Risks discussed:  Pain, retained foreign body, poor cosmetic result, poor wound healing, need for additional repair and infection   Alternatives discussed:  No treatment, delayed treatment and observation Anesthesia:    Anesthesia method:  Local infiltration   Local anesthetic:  Lidocaine 2% WITH epi Laceration details:    Location:  Face   Face location:  Forehead   Length (cm):  6 Pre-procedure details:    Preparation:  Patient was prepped and draped in usual sterile fashion and imaging obtained to evaluate for foreign bodies Exploration:    Limited defect created (wound extended): no     Hemostasis achieved  with:  Epinephrine and direct pressure   Imaging outcome: foreign body  not noted     Contaminated: no   Treatment:    Area cleansed with:  Soap and water   Amount of cleaning:  Standard Skin repair:    Repair method:  Staples and sutures (3 staples and 13 sutures of 4-0 Prolene.  Combination of simple interrupted and running.) Approximation:    Approximation:  Close Repair type:    Repair type:  Simple Post-procedure details:    Dressing:  Sterile dressing   Procedure completion:  Tolerated well, no immediate complications   Medications Ordered in ED Medications  lidocaine-EPINEPHrine (XYLOCAINE W/EPI) 2 %-1:200000 (PF) injection 10 mL (10 mLs Infiltration Given 11/03/21 1641)  sodium chloride 0.9 % bolus 1,000 mL (0 mLs Intravenous Stopped 11/03/21 2116)  iohexol (OMNIPAQUE) 350 MG/ML injection 100 mL (100 mLs Intravenous Contrast Given 11/03/21 2027)    ED Course  I have reviewed the triage vital signs and the nursing notes.  Pertinent labs & imaging results that were available during my care of the patient were reviewed by me and considered in my medical decision making (see chart for details).    MDM Rules/Calculators/A&P                           Patient presents after fall.  Mechanical.  Unsteady due to previous neck injury.  However hit head and had laceration to forehead.  Approximately 6 cm.  Closed but had a lot of blood loss.  Initial hemoglobin reassuring but second hemoglobin dropped by about 2 g.  Has had episodes of hypotension.  Discussed with Dr. Dema Severin once the first hypotension happened.  We decided since no other apparent injury such as intrathoracic or intra-abdominal did not need to be elevated to a level 1 trauma but will be watched closely.  Fluid bolus given and did have some improvement of blood pressure, however it decreased again after the bolus stopped.  Hemoglobin went down to grams.  CT abdomen pelvis done and showed no acute bleeding.  However blood pressure dropped again after the fluid stopped.  Discussed again with Dr.  Dema Severin from trauma surgery.  Feels if this can be a medicine admission.  No need for acute surgical intervention since the bleeding is already stopped by my closing the wound.  Only determination would be if he needs transfusion later.  Will discuss with unassigned medicine.  CRITICAL CARE Performed by: Davonna Belling Total critical care time: 30 minutes Critical care time was exclusive of separately billable procedures and treating other patients. Critical care was necessary to treat or prevent imminent or life-threatening deterioration. Critical care was time spent personally by me on the following activities: development of treatment plan with patient and/or surrogate as well as nursing, discussions with consultants, evaluation of patient's response to treatment, examination of patient, obtaining history from patient or surrogate, ordering and performing treatments and interventions, ordering and review of laboratory studies, ordering and review of radiographic studies, pulse oximetry and re-evaluation of patient's condition.  Final Clinical Impression(s) / ED Diagnoses Final diagnoses:  Fall    Rx / DC Orders ED Discharge Orders     None        Davonna Belling, MD 11/03/21 2324

## 2021-11-03 NOTE — Progress Notes (Signed)
Responded to ED page to support patient fail. Per nurse patient stood up and felt neck pop then he fail experiencing several injuries. Chaplain will continue support as needed.  Cristopher Peru, [facility], Pager [phone]

## 2021-11-03 NOTE — Progress Notes (Signed)
Orthopedic Tech Progress Note Patient Details:  [name] Feb 05, 1941 [id number] Level 2 Trauma. Not needed Patient ID: [name], male   DOB: Mar 19, 1941, 80 y.o.   MRN: [id number]  [name] 11/03/2021, 3:30 PM

## 2021-11-03 NOTE — ED Notes (Signed)
Trauma Response Nurse Note-  Reason for Call / Reason for Trauma activation:   - L2 fall on thinners  Initial Focused Assessment (If applicable, or please see trauma documentation):  - Significant lac to R forehead just above eye. - VSS - GCS 15 - pupils equal and brisk  Interventions:  - 20G PIV to R AC - Trauma labs - CXR and Pelvic XR - CT head and c-spine - logrolled - back of head assessed due to pooled blood - no active bleeding to occiput head.  Plan of Care as of this note:  - Probable suturing to forehead lac.   [place], [place]

## 2021-11-04 DIAGNOSIS — N179 Acute kidney failure, unspecified: Secondary | ICD-10-CM | POA: Diagnosis not present

## 2021-11-04 DIAGNOSIS — I9589 Other hypotension: Secondary | ICD-10-CM

## 2021-11-04 DIAGNOSIS — E861 Hypovolemia: Secondary | ICD-10-CM

## 2021-11-04 DIAGNOSIS — S0181XA Laceration without foreign body of other part of head, initial encounter: Secondary | ICD-10-CM

## 2021-11-04 DIAGNOSIS — I480 Paroxysmal atrial fibrillation: Secondary | ICD-10-CM

## 2021-11-04 DIAGNOSIS — N281 Cyst of kidney, acquired: Secondary | ICD-10-CM

## 2021-11-04 DIAGNOSIS — W19XXXA Unspecified fall, initial encounter: Secondary | ICD-10-CM | POA: Diagnosis not present

## 2021-11-04 DIAGNOSIS — I5032 Chronic diastolic (congestive) heart failure: Secondary | ICD-10-CM

## 2021-11-04 DIAGNOSIS — E875 Hyperkalemia: Secondary | ICD-10-CM

## 2021-11-04 DIAGNOSIS — I959 Hypotension, unspecified: Secondary | ICD-10-CM

## 2021-11-04 LAB — BASIC METABOLIC PANEL
Anion gap: 4 — ABNORMAL LOW (ref 5–15)
BUN: 19 mg/dL (ref 8–23)
CO2: 26 mmol/L (ref 22–32)
Calcium: 8.6 mg/dL — ABNORMAL LOW (ref 8.9–10.3)
Chloride: 107 mmol/L (ref 98–111)
Creatinine, Ser: 1.1 mg/dL (ref 0.61–1.24)
GFR, Estimated: >60 mL/min (ref >60)
Glucose, Bld: 121 mg/dL — ABNORMAL HIGH (ref 70–99)
Potassium: 3.9 mmol/L (ref 3.5–5.1)
Sodium: 137 mmol/L (ref 135–145)

## 2021-11-04 LAB — CBC
HCT: 36.3 % — ABNORMAL LOW (ref 39.0–52.0)
Hemoglobin: 11.8 g/dL — ABNORMAL LOW (ref 13.0–17.0)
MCH: 31.4 pg (ref 26.0–34.0)
MCHC: 32.5 g/dL (ref 30.0–36.0)
MCV: 96.5 fL (ref 80.0–100.0)
Platelets: 171 10*3/uL (ref 150–400)
RBC: 3.76 MIL/uL — ABNORMAL LOW (ref 4.22–5.81)
RDW: 14 % (ref 11.5–15.5)
WBC: 8.4 10*3/uL (ref 4.0–10.5)
nRBC: 0 % (ref 0.0–0.2)

## 2021-11-04 LAB — RESP PANEL BY RT-PCR (FLU A&B, COVID) ARPGX2
Influenza A by PCR: NEGATIVE
Influenza B by PCR: NEGATIVE
SARS Coronavirus 2 by RT PCR: NEGATIVE

## 2021-11-04 LAB — CK: Total CK: 71 U/L (ref 49–397)

## 2021-11-04 LAB — HEMOGLOBIN AND HEMATOCRIT, BLOOD
HCT: 36.5 % — ABNORMAL LOW (ref 39.0–52.0)
Hemoglobin: 11.7 g/dL — ABNORMAL LOW (ref 13.0–17.0)

## 2021-11-04 MED ORDER — BUPROPION HCL 100 MG PO TABS
100.0000 mg | ORAL_TABLET | Freq: Two times a day (BID) | ORAL | Status: DC
  Administered 2021-11-04 – 2021-11-05 (×4): 100 mg via ORAL
  Filled 2021-11-04 (×7): qty 1

## 2021-11-04 MED ORDER — OXYCODONE-ACETAMINOPHEN 5-325 MG PO TABS
2.0000 | ORAL_TABLET | Freq: Four times a day (QID) | ORAL | Status: DC | PRN
  Administered 2021-11-04 – 2021-11-06 (×7): 2 via ORAL
  Filled 2021-11-04 (×7): qty 2

## 2021-11-04 MED ORDER — PANTOPRAZOLE SODIUM 40 MG PO TBEC
40.0000 mg | DELAYED_RELEASE_TABLET | Freq: Two times a day (BID) | ORAL | Status: DC
  Administered 2021-11-04 – 2021-11-06 (×5): 40 mg via ORAL
  Filled 2021-11-04 (×5): qty 1

## 2021-11-04 MED ORDER — LACTATED RINGERS IV SOLN: INTRAVENOUS | Status: AC

## 2021-11-04 MED ORDER — ASPIRIN EC 81 MG PO TBEC
81.0000 mg | DELAYED_RELEASE_TABLET | Freq: Every day | ORAL | Status: DC
  Administered 2021-11-04 – 2021-11-06 (×3): 81 mg via ORAL
  Filled 2021-11-04 (×3): qty 1

## 2021-11-04 MED ORDER — SIMVASTATIN 20 MG PO TABS
40.0000 mg | ORAL_TABLET | Freq: Every day | ORAL | Status: DC
  Administered 2021-11-04 – 2021-11-05 (×2): 40 mg via ORAL
  Filled 2021-11-04 (×2): qty 2

## 2021-11-04 MED ORDER — GABAPENTIN 300 MG PO CAPS
600.0000 mg | ORAL_CAPSULE | Freq: Three times a day (TID) | ORAL | Status: DC
  Administered 2021-11-04 – 2021-11-06 (×7): 600 mg via ORAL
  Filled 2021-11-04 (×7): qty 2

## 2021-11-04 MED ORDER — POLYETHYLENE GLYCOL 3350 17 G PO PACK
17.0000 g | PACK | Freq: Every day | ORAL | Status: DC
  Administered 2021-11-04 – 2021-11-06 (×2): 17 g via ORAL
  Filled 2021-11-04 (×3): qty 1

## 2021-11-04 MED ORDER — ROPINIROLE HCL 0.5 MG PO TABS
0.2500 mg | ORAL_TABLET | Freq: Two times a day (BID) | ORAL | Status: DC
  Administered 2021-11-04 – 2021-11-06 (×5): 0.25 mg via ORAL
  Filled 2021-11-04 (×6): qty 1

## 2021-11-04 MED ORDER — METOPROLOL TARTRATE 75 MG PO TABS
75.0000 mg | ORAL_TABLET | Freq: Two times a day (BID) | ORAL | Status: DC
  Administered 2021-11-04: 75 mg via ORAL

## 2021-11-04 MED ORDER — TOPIRAMATE 25 MG PO TABS
25.0000 mg | ORAL_TABLET | Freq: Two times a day (BID) | ORAL | Status: DC
  Administered 2021-11-04 – 2021-11-06 (×5): 25 mg via ORAL
  Filled 2021-11-04 (×6): qty 1

## 2021-11-04 MED ORDER — FLUTICASONE PROPIONATE 50 MCG/ACT NA SUSP: 1.0000 | Freq: Every day | NASAL | Status: DC | PRN

## 2021-11-04 MED ORDER — LACTATED RINGERS IV BOLUS
500.0000 mL | Freq: Once | INTRAVENOUS | Status: AC
  Administered 2021-11-04: 500 mL via INTRAVENOUS

## 2021-11-04 MED ORDER — METOPROLOL TARTRATE 12.5 MG HALF TABLET
12.5000 mg | ORAL_TABLET | Freq: Two times a day (BID) | ORAL | Status: DC
  Administered 2021-11-04 – 2021-11-05 (×3): 12.5 mg via ORAL
  Filled 2021-11-04 (×5): qty 1

## 2021-11-04 NOTE — Progress Notes (Signed)
[facility] PROGRESS NOTE    Progress Note  [name]  LNL:[id number] DOB: 29-Jun-1941 DOA: 11/03/2021 PCP: Hayden Rasmussen, MD     Brief Narrative:   [name] is an 80 y.o. male past medical history significant for permanent atrial fibrillation on Eliquis, CVA, history of GI bleed, chronic diastolic heart failure who was been dealing with neck pain for quite a while he has had a thoracic spine spinal fusion in the past when he turned his head and fell and hit the dresser.  Was found hypotensive in the ED which responded to fluid resuscitation trauma was consulted just recommended observation with a CT of the head cervical spine and abdomen which did not reveal any trauma.  Initially his hemoglobin was 14.7 repeated 1 after fluid resuscitation was 13    Assessment/Plan:   Hypotension following mechanical fall with a bleeding laceration of the scalp: Patient is on Eliquis at home was bleeding he relates for at least 2 hours.  Laceration has been repaired by the ED. CT of the head showed no evidence of significant trauma large right frontal scalp hematoma and contusion.  Moderate cerebral atrophy with chronic microvascular ischemic changes. CT of the C-spine showed no acute findings. Hold Eliquis for 24 hours. Continue gentle IV fluid hydration agree with holding antihypertensive medication.  Right anterior forehead laceration: Will need to follow-up with PCP as an outpatient for suture removal. No further bleeding continue to hold Eliquis.  Acute kidney injury: Mildly elevated on admission 1.3 after fluid resuscitation came down to 1 we will KVO IV fluids blood pressure is stabilized.  Hyperkalemia: Suspect due to hypotension and decreased renal perfusion.  Resolved with IV fluid hydration.  Acute blood loss anemia/normocytic anemia: In the setting of Eliquis likely due to a laceration of his forehead has been repaired continue to hold Eliquis for the next 24  hours.  Chronic diastolic heart failure: Appears to be compensated we will decrease in KVO IV fluids over the next several hours. Resume metoprolol continue to hold diuretic and diltiazem.  Incidental renal cyst: Noted on CT scan will need to follow-up as an outpatient.  Chronic pain: Continue gabapentin and [place] as needed.  Decubitus ulcer stage III present on admission: RN Pressure Injury Documentation: Pressure Injury 10/06/19 Sacrum Stage III -  Full thickness tissue loss. Subcutaneous fat may be visible but bone, tendon or muscle are NOT exposed. (Active)  10/06/19 2300  Location: Sacrum  Location Orientation:   Staging: Stage III -  Full thickness tissue loss. Subcutaneous fat may be visible but bone, tendon or muscle are NOT exposed.  Wound Description (Comments):   Present on Admission: Yes    DVT prophylaxis: scd Family Communication:none Status is: Observation  The patient remains OBS appropriate and will d/c before 2 midnights.       Code Status:     Code Status Orders  (From admission, onward)           Start     Ordered   11/04/21 0147  Full code  Continuous        11/04/21 0147           Code Status History     Date Active Date Inactive Code Status Order ID Comments User Context   11/10/2019 0800 11/16/2019 1848 Full Code [id number]  [name], MD ED   10/06/2019 2053 10/08/2019 1959 Full Code [id number]  Shela Leff, MD ED   10/13/2018 1608 10/16/2018 1934 Full Code  [id number]  Karmen Bongo, MD ED   09/18/2018 2134 09/20/2018 1940 Full Code [id number]  Theora Gianotti, NP Inpatient         IV Access:   Peripheral IV   Procedures and diagnostic studies:   CT HEAD WO CONTRAST (5MM)  Result Date: 11/03/2021 CLINICAL DATA:  80 year old male with history of head trauma after falling out of bed. EXAM: CT HEAD WITHOUT CONTRAST CT CERVICAL SPINE WITHOUT CONTRAST TECHNIQUE: Multidetector CT imaging of the head and  cervical spine was performed following the standard protocol without intravenous contrast. Multiplanar CT image reconstructions of the cervical spine were also generated. COMPARISON:  Cervical spine CT scan 12/31/2019. Brain MRI 11/11/2019. Head CT 11/10/2019. Moderate cerebral atrophy. FINDINGS: CT HEAD FINDINGS Brain: Moderate cerebral atrophy. Patchy and confluent areas of decreased attenuation are noted throughout the deep and periventricular white matter of the cerebral hemispheres bilaterally, compatible with chronic microvascular ischemic disease. Chronic encephalomalacia in the inferomedial aspect of the right cerebellar hemisphere, similar to prior examinations. No evidence of acute infarction, hemorrhage, hydrocephalus, extra-axial collection or mass lesion/mass effect. Vascular: No hyperdense vessel or unexpected calcification. Skull: Orthopedic fixation hardware in the occipital bone. No acute displaced skull fractures. Sinuses/Orbits: No acute finding. Other: Extensive soft tissue swelling and gas in the soft tissues of the right frontal scalp, presumably from scalp contusion/laceration. CT CERVICAL SPINE FINDINGS Comment: Today's study is limited by considerable artifact from indwelling spinal hardware. Alignment: 2 mm of anterolisthesis of C2 upon C3 and 5 mm of anterolisthesis of C6 upon C7 both appear to be chronic. Alignment is otherwise anatomic. Skull base and vertebrae: Rod and screw fixation hardware extending from the occipital bone to the level of C7 (occipital bone to C3 and C4-C7). Status post ACDF at C6-C7. Complete bony fusion of the facet joints throughout the cervical spine. Complete bony fusion of C6-C7. No acute displaced fractures are identified. Soft tissues and spinal canal: No prevertebral fluid or swelling. No visible canal hematoma. Disc levels: Severe multilevel degenerative disc disease throughout the cervical spine and visualized upper thoracic spine, with complete bony  fusion at C6-C7. Complete bony fusion of the facet joints throughout the cervical spine. Upper chest: Unremarkable. Other: None. IMPRESSION: 1. No evidence of significant acute traumatic injury to the skull, brain or cervical spine. 2. Large right frontal scalp contusion/laceration. 3. Moderate cerebral atrophy with chronic microvascular ischemic changes in cerebral white matter and chronic encephalomalacia in the right cerebellar hemisphere, similar to prior examinations, as above. 4. Postoperative changes and chronic degenerative findings in the cervical spine, as above. Electronically Signed   By: Vinnie Langton M.D.   On: 11/03/2021 16:18   CT Cervical Spine Wo Contrast  Result Date: 11/03/2021 CLINICAL DATA:  80 year old male with history of head trauma after falling out of bed. EXAM: CT HEAD WITHOUT CONTRAST CT CERVICAL SPINE WITHOUT CONTRAST TECHNIQUE: Multidetector CT imaging of the head and cervical spine was performed following the standard protocol without intravenous contrast. Multiplanar CT image reconstructions of the cervical spine were also generated. COMPARISON:  Cervical spine CT scan 12/31/2019. Brain MRI 11/11/2019. Head CT 11/10/2019. Moderate cerebral atrophy. FINDINGS: CT HEAD FINDINGS Brain: Moderate cerebral atrophy. Patchy and confluent areas of decreased attenuation are noted throughout the deep and periventricular white matter of the cerebral hemispheres bilaterally, compatible with chronic microvascular ischemic disease. Chronic encephalomalacia in the inferomedial aspect of the right cerebellar hemisphere, similar to prior examinations. No evidence of acute infarction, hemorrhage, hydrocephalus, extra-axial collection or mass lesion/mass effect.  Vascular: No hyperdense vessel or unexpected calcification. Skull: Orthopedic fixation hardware in the occipital bone. No acute displaced skull fractures. Sinuses/Orbits: No acute finding. Other: Extensive soft tissue swelling and gas in  the soft tissues of the right frontal scalp, presumably from scalp contusion/laceration. CT CERVICAL SPINE FINDINGS Comment: Today's study is limited by considerable artifact from indwelling spinal hardware. Alignment: 2 mm of anterolisthesis of C2 upon C3 and 5 mm of anterolisthesis of C6 upon C7 both appear to be chronic. Alignment is otherwise anatomic. Skull base and vertebrae: Rod and screw fixation hardware extending from the occipital bone to the level of C7 (occipital bone to C3 and C4-C7). Status post ACDF at C6-C7. Complete bony fusion of the facet joints throughout the cervical spine. Complete bony fusion of C6-C7. No acute displaced fractures are identified. Soft tissues and spinal canal: No prevertebral fluid or swelling. No visible canal hematoma. Disc levels: Severe multilevel degenerative disc disease throughout the cervical spine and visualized upper thoracic spine, with complete bony fusion at C6-C7. Complete bony fusion of the facet joints throughout the cervical spine. Upper chest: Unremarkable. Other: None. IMPRESSION: 1. No evidence of significant acute traumatic injury to the skull, brain or cervical spine. 2. Large right frontal scalp contusion/laceration. 3. Moderate cerebral atrophy with chronic microvascular ischemic changes in cerebral white matter and chronic encephalomalacia in the right cerebellar hemisphere, similar to prior examinations, as above. 4. Postoperative changes and chronic degenerative findings in the cervical spine, as above. Electronically Signed   By: Vinnie Langton M.D.   On: 11/03/2021 16:18   DG Pelvis Portable  Result Date: 11/03/2021 CLINICAL DATA:  Status post fall. EXAM: PORTABLE PELVIS 1-2 VIEWS COMPARISON:  None. FINDINGS: There is no evidence of pelvic fracture or diastasis. No pelvic bone lesions are seen. IMPRESSION: Negative. Electronically Signed   By: Virgina Norfolk M.D.   On: 11/03/2021 15:53   CT CHEST ABDOMEN PELVIS W CONTRAST  Result  Date: 11/03/2021 CLINICAL DATA:  Abdominal trauma.  Status post fall EXAM: CT CHEST, ABDOMEN, AND PELVIS WITH CONTRAST TECHNIQUE: Multidetector CT imaging of the chest, abdomen and pelvis was performed following the standard protocol during bolus administration of intravenous contrast. CONTRAST:  126mL OMNIPAQUE IOHEXOL 350 MG/ML SOLN COMPARISON:  None. FINDINGS: CHEST: Ports and Devices: None. Lungs/airways: No focal consolidation. Punctate calcified pulmonary micronodules. No pulmonary mass. No pulmonary contusion or laceration. No pneumatocele formation. The central airways are patent. Mild diffuse bronchial wall thickening. Pleura: No pleural effusion. No pneumothorax. No hemothorax. Lymph Nodes: No mediastinal, hilar, or axillary lymphadenopathy. Mediastinum: No pneumomediastinum. No aortic injury or mediastinal hematoma. The thoracic aorta is normal in caliber. Status post aortic valve replacement. Atherosclerotic plaque. Enlarged left atrium. No significant pericardial effusion. The main pulmonary artery is normal in caliber. No central pulmonary embolus. The esophagus is unremarkable. The thyroid is unremarkable. Chest Wall / Breasts: No chest wall mass.  Bilateral gynecomastia. Musculoskeletal: No acute rib or sternal fracture. Old healed left rib fractures. No spinal fracture. Multilevel severe degenerative changes of the spine. Lower cervical spine surgical hardware-please see separately dictated CT cervical spine 11/03/2021. T10-T11 posterolateral fusion surgical hardware. ABDOMEN / PELVIS: Liver: Not enlarged. No focal lesion. No laceration or subcapsular hematoma. Biliary System: The gallbladder is otherwise unremarkable with no radio-opaque gallstones. No biliary ductal dilatation. Pancreas: Normal pancreatic contour. No main pancreatic duct dilatation. Spleen: Not enlarged. No focal lesion. No laceration, subcapsular hematoma, or vascular injury. Adrenal Glands: No nodularity bilaterally. Kidneys:  Bilateral kidneys enhance symmetrically. No  hydronephrosis. No contusion, laceration, or subcapsular hematoma. Punctate calcified stone within the right kidney. There is a 2.4 cm fluid density lesion within the right kidney that likely represents a simple renal cyst. Pericentimeter fluid density lesion within left kidney likely represents a simple renal cyst. Indeterminate slightly more conspicuous 1 cm left renal cyst with a density of 30 Hounsfield units. No injury to the vascular structures or collecting systems. No hydroureter. The urinary bladder is decompressed with circumferential urinary bladder wall thickening likely due to under distension. Urinary bladder diverticula noted. Bowel: Gastric surgical changes. No small or large bowel wall thickening or dilatation. The appendix is unremarkable. Mesentery, Omentum, and Peritoneum: No simple free fluid ascites. No pneumoperitoneum. No hemoperitoneum. No mesenteric hematoma identified. No organized fluid collection. Pelvic Organs: Normal. Lymph Nodes: No abdominal, pelvic, inguinal lymphadenopathy. Vasculature: Atherosclerotic plaque. No abdominal aorta or iliac aneurysm. No active contrast extravasation or pseudoaneurysm. Musculoskeletal: No significant soft tissue hematoma. Small fat containing supraumbilical ventral hernias with an abdominal defect of 2 cm (7:63) as well as 1.1 cm (7:71). No acute pelvic fracture. No spinal fracture. Multilevel severe degenerative changes of the spine. IMPRESSION: 1. No acute traumatic injury to the chest, abdomen, or pelvis. 2. No acute fracture or traumatic malalignment of the thoracic or lumbar spine. Other imaging findings of potential clinical significance: 1. Indeterminate slightly more conspicuous 1 cm left renal cyst with a density of 30 Hounsfield units. Recommend MRI renal protocol for further evaluation. When the patient is clinically stable and able to follow directions and hold their breath (preferably as an  outpatient) further evaluation with dedicated abdominal MRI should be considered. 2. Nonobstructive punctate right nephrolithiasis. 3. Fat containing supraumbilical ventral hernias. 4. The circumferential urinary bladder wall thickening likely due to under distension. Urinary bladder diverticula noted. 5. Left atrial enlargement. Electronically Signed   By: Iven Finn M.D.   On: 11/03/2021 20:59   DG Chest Portable 1 View  Result Date: 11/03/2021 CLINICAL DATA:  Status post fall. EXAM: PORTABLE CHEST 1 VIEW COMPARISON:  None. FINDINGS: The heart size and mediastinal contours are within normal limits. An artificial aortic valve is seen. Both lungs are clear. Postoperative changes are noted within the cervical spine and lower thoracic spine. Degenerative changes are also seen involving the thoracic spine and bilateral shoulders. IMPRESSION: No active disease. Electronically Signed   By: Virgina Norfolk M.D.   On: 11/03/2021 15:52   DG Hip Unilat W or Wo Pelvis 2-3 Views Right  Result Date: 11/03/2021 CLINICAL DATA:  Fall. EXAM: DG HIP (WITH OR WITHOUT PELVIS) 2-3V RIGHT COMPARISON:  None. FINDINGS: There is no acute fracture or dislocation. There are mild degenerative changes of the right hip. There are vascular calcifications in the soft tissues. IMPRESSION: No acute bony abnormality. Electronically Signed   By: Ronney Asters M.D.   On: 11/03/2021 16:25     Medical Consultants:   None.   Subjective:    [name] relates he still has a headache has not eaten anything and is hungry.  Objective:    Vitals:   11/04/21 0620 11/04/21 0625 11/04/21 0630 11/04/21 0650  BP:    113/81  Pulse: 82 85 83 (!) 104  Resp: 15 12 13 10   Temp:      TempSrc:      SpO2: 98% 98% 98% 98%  Weight:      Height:       SpO2: 98 %   Intake/Output Summary (Last 24 hours) at 11/04/2021  [place] filed at 11/03/2021 2201 Gross per 24 hour  Intake 1000 ml  Output --  Net 1000 ml   Filed  Weights   11/03/21 1534  Weight: 109 kg    Exam: General exam: In no acute distress, traumatic head with a laceration of about 12 to 8 cm which has been repaired no further bleeding Respiratory system: Good air movement and clear to auscultation. Cardiovascular system: S1 & S2 heard, RRR. No JVD. Gastrointestinal system: Abdomen is nondistended, soft and nontender.  Extremities: No pedal edema. Skin: No rashes, lesions or ulcers Psychiatry: Judgement and insight appear normal. Mood & affect appropriate.    Data Reviewed:    Labs: Basic Metabolic Panel: Recent Labs  Lab 11/03/21 1548 11/04/21 0350  NA 139 137  K 5.2* 3.9  CL 103 107  CO2  --  26  GLUCOSE 101* 121*  BUN 26* 19  CREATININE 1.30* 1.10  CALCIUM  --  8.6*   GFR Estimated Creatinine Clearance: 66.2 mL/min (by C-G formula based on SCr of 1.1 mg/dL). Liver Function Tests: No results for input(s): AST, ALT, ALKPHOS, BILITOT, PROT, ALBUMIN in the last 168 hours. No results for input(s): LIPASE, AMYLASE in the last 168 hours. No results for input(s): AMMONIA in the last 168 hours. Coagulation profile No results for input(s): INR, PROTIME in the last 168 hours. COVID-19 Labs  No results for input(s): DDIMER, FERRITIN, LDH, CRP in the last 72 hours.  Lab Results  Component Value Date   SARSCOV2NAA NEGATIVE 11/03/2021   SARSCOV2NAA NEGATIVE 11/10/2019   [place] NEGATIVE 10/06/2019    CBC: Recent Labs  Lab 11/03/21 1535 11/03/21 1548 11/03/21 1940 11/04/21 0000 11/04/21 0350  WBC 8.4  --  8.7  --  8.4  HGB 14.7 16.0 12.9* 11.7* 11.8*  HCT 44.3 47.0 39.6 36.5* 36.3*  MCV 95.5  --  95.9  --  96.5  PLT 151  --  192  --  171   Cardiac Enzymes: Recent Labs  Lab 11/04/21 0000  CKTOTAL 71   BNP (last 3 results) No results for input(s): PROBNP in the last 8760 hours. CBG: No results for input(s): GLUCAP in the last 168 hours. D-Dimer: No results for input(s): DDIMER in the last 72 hours. Hgb  A1c: No results for input(s): HGBA1C in the last 72 hours. Lipid Profile: No results for input(s): CHOL, HDL, LDLCALC, TRIG, CHOLHDL, LDLDIRECT in the last 72 hours. Thyroid function studies: No results for input(s): TSH, T4TOTAL, T3FREE, THYROIDAB in the last 72 hours.  Invalid input(s): FREET3 Anemia work up: No results for input(s): VITAMINB12, FOLATE, FERRITIN, TIBC, IRON, RETICCTPCT in the last 72 hours. Sepsis Labs: Recent Labs  Lab 11/03/21 1535 11/03/21 1940 11/04/21 0350  WBC 8.4 8.7 8.4   Microbiology Recent Results (from the past 240 hour(s))  Resp Panel by RT-PCR (Flu A&B, Covid) Nasopharyngeal Swab     Status: None   Collection Time: 11/03/21 11:55 PM   Specimen: Nasopharyngeal Swab; Nasopharyngeal(NP) swabs in vial transport medium  Result Value Ref Range Status   SARS Coronavirus 2 by RT PCR NEGATIVE NEGATIVE Final    Comment: (NOTE) SARS-CoV-2 target nucleic acids are NOT DETECTED.  The SARS-CoV-2 RNA is generally detectable in upper respiratory specimens during the acute phase of infection. The lowest concentration of SARS-CoV-2 viral copies this assay can detect is 138 copies/mL. A negative result does not preclude SARS-Cov-2 infection and should not be used as the sole basis for treatment or other patient  management decisions. A negative result may occur with  improper specimen collection/handling, submission of specimen other than nasopharyngeal swab, presence of viral mutation(s) within the areas targeted by this assay, and inadequate number of viral copies(<138 copies/mL). A negative result must be combined with clinical observations, patient history, and epidemiological information. The expected result is Negative.  Fact Sheet for Patients:  EntrepreneurPulse.com.au  Fact Sheet for Healthcare Providers:  IncredibleEmployment.be  This test is no t yet approved or cleared by the [place] FDA and  has been  authorized for detection and/or diagnosis of SARS-CoV-2 by FDA under an Emergency Use Authorization (EUA). This EUA will remain  in effect (meaning this test can be used) for the duration of the COVID-19 declaration under Section 564(b)(1) of the Act, 21 U.S.C.section 360bbb-3(b)(1), unless the authorization is terminated  or revoked sooner.       Influenza A by PCR NEGATIVE NEGATIVE Final   Influenza B by PCR NEGATIVE NEGATIVE Final    Comment: (NOTE) The Xpert Xpress SARS-CoV-2/FLU/RSV plus assay is intended as an aid in the diagnosis of influenza from Nasopharyngeal swab specimens and should not be used as a sole basis for treatment. Nasal washings and aspirates are unacceptable for Xpert Xpress SARS-CoV-2/FLU/RSV testing.  Fact Sheet for Patients: EntrepreneurPulse.com.au  Fact Sheet for Healthcare Providers: IncredibleEmployment.be  This test is not yet approved or cleared by the [place] FDA and has been authorized for detection and/or diagnosis of SARS-CoV-2 by FDA under an Emergency Use Authorization (EUA). This EUA will remain in effect (meaning this test can be used) for the duration of the COVID-19 declaration under Section 564(b)(1) of the Act, 21 U.S.C. section 360bbb-3(b)(1), unless the authorization is terminated or revoked.  Performed at [facility], [place] [address] [place], [place] [address]      Medications:    gabapentin  600 mg Oral TID   simvastatin  40 mg Oral QHS   topiramate  25 mg Oral BID   Continuous Infusions:  lactated ringers 50 mL/hr at 11/04/21 0213      LOS: 0 days   [name]  [facility]  11/04/2021, 7:06 AM

## 2021-11-04 NOTE — Progress Notes (Signed)
No additional needs from trauma perspective. Will remain available, please call if questions or new traumatic concerns develop.   Jesusita Oka, MD General and [facility]

## 2021-11-04 NOTE — H&P (Signed)
History and Physical    [name] LDJ:[id number] DOB: 1941/11/09 DOA: 11/03/2021  PCP: Hayden Rasmussen, MD  Patient coming from: Home  I have personally briefly reviewed patient's old medical records in [place]  Chief Complaint: Fall  HPI: [name] is a 80 y.o. male with medical history significant for permanent atrial fibrillation on Eliquis, CVA, history of GI bleed, chronic diastolic heart failure, and hypertension who presents s/p a fall.  Patient has been dealing with neck pain and trouble with ambulation following occipital to thoracic fusion surgery in the past.  Earlier today he turned his head and had severe pain to his neck feeling like a "bat had had hit him" and he fell face forward hitting a dresser.  His sister was at the other house did not find him until about 2 hours later to help him up.  States he felt confused after the fall but denies any loss of consciousness.  Reports brisk bleeding to his head and that his shirt was also with blood.  Last took his Eliquis earlier in the morning prior to the incident.  ED Course: He was hypotensive down to 70/50s with initial improvement following a liter of fluid.  Trauma surgery was consulted and just recommend observation given his CT head, cervical spine, abdomen did not have any other trauma.   Suspect source of hypotension from right anterior forehead laceration. Laceration was sutured and stapled in ED. Initial hemoglobin noted to be around 14.7 and decreased to 12.9 following a liter of fluid bolus.  Review of Systems: Constitutional: No Weight Change, No Fever ENT/Mouth: No sore throat, No Rhinorrhea Eyes: No Eye Pain, No Vision Changes Cardiovascular: No Chest Pain, no SOB Respiratory: No Cough, No Sputum Gastrointestinal: No Nausea, No Vomiting, No Diarrhea, No Constipation, No Pain Genitourinary: no Urinary Incontinence Musculoskeletal: No Arthralgias, No Myalgias Skin: No Skin Lesions, No  Pruritus, Neuro: no Weakness, No Numbness,  No Loss of Consciousness, No Syncope Psych: No Anxiety/Panic, No Depression, no decrease appetite Heme/Lymph: No Bruising, No Bleeding  Past Medical History:  Diagnosis Date   Cancer ([place])    Carpal tunnel syndrome    Cervical spondylosis with myelopathy    Chronic pain syndrome    Depression    Difficult intubation    needs smaller tube; awake oral fiberoptic scope 8.0 ETT 03/30/08   Dysphagia, pharyngeal phase    Hemorrhage of gastrointestinal tract, unspecified    History of cardiac cath    a. 03/2018 Cath: LAD and LCX with mild luminal irregularities.  No significant disease.   Iron deficiency anemia, unspecified    Lumbosacral spondylosis without myelopathy    Muscle weakness (generalized)    Other and unspecified disc disorder of cervical region    Other and unspecified hyperlipidemia    Patent foramen ovale    a. 05/2018 Echo: post-op TAVR--> + bublble study w/ L->R shunt.   Permanent atrial fibrillation ([place])    a.  Diagnosed in the spring 2019.  Had rapid atrial fibrillation following TAVR and has been rate controlled with beta-blocker.  CHA2DS2VASc equals 7.  Supposed to be on Eliquis.   Primary localized osteoarthrosis, lower leg    Primary localized osteoarthrosis, shoulder region    Reflux esophagitis    Severe aortic stenosis    a. mild-mod AS by 01/2016 TTE, restricted AV opening with mod AR 01/2016 TEE; b. 10/2017 Echo: EF 60-65%, sev Ca2+ AoV, mean grad 53mmHg; c. 05/2018 s/p TAVR ([facility]); d. 06/2018  Echo: EF 55-60%, well-seated prosth AoV, peak velocity 156cm/s. AoV gradient 62mmHg.   Spinal stenosis, unspecified region other than cervical    Stroke ([place])    01/2016   Syncope and collapse    Unspecified arthropathy, lower leg    Unspecified constipation    Unspecified essential hypertension    Unspecified glaucoma(365.9)     Past Surgical History:  Procedure Laterality Date   BILATERAL CATARACT SURGERY  2009   DR GROAT     BIOPSY  10/14/2018   Procedure: BIOPSY;  Surgeon: Wonda Horner, MD;  Location: [facility] ENDOSCOPY;  Service: Endoscopy;;   CARDIAC VALVE REPLACEMENT  05/2018   CERVIACAL SPINE (4X)     DR MARK ROY    COLONOSCOPY  2007   DR HENSEL    COLONOSCOPY WITH PROPOFOL N/A 11/20/2017   Procedure: COLONOSCOPY WITH PROPOFOL;  Surgeon: Wilford Corner, MD;  Location: [facility] ENDOSCOPY;  Service: Endoscopy;  Laterality: N/A;   ESOPHAGOGASTRODUODENOSCOPY (EGD) WITH PROPOFOL N/A 10/14/2018   Procedure: ESOPHAGOGASTRODUODENOSCOPY (EGD) WITH PROPOFOL;  Surgeon: Wonda Horner, MD;  Location: [facility] ENDOSCOPY;  Service: Endoscopy;  Laterality: N/A;   JOINT REPLACEMENT     both knees   LEFT KNEE REPLACEMENT     DR ALUSIO   LEFT TRANSVERSE CARPAL LIGAMENT  01/06/2008   ROTATOR CUFF LEFT SHOULDER  2001   DR MURPHY    SHOULDER OPEN ROTATOR CUFF REPAIR  2006   DR [facility]   SPINE SURGERY     neck fusion     reports that he quit smoking about 36 years ago. He quit smokeless tobacco use about 36 years ago. He reports that he does not drink alcohol and does not use drugs. Social History  Allergies  Allergen Reactions   Lorazepam Other (See Comments)    Delirium    Duloxetine Other (See Comments)    Increased depression, irritability   Pregabalin Swelling and Other (See Comments)    Flashbacks, Bad dreams, Aggressive behavior, swelling   Zanaflex [Tizanidine Hcl] Anxiety    Family History  Problem Relation Age of Onset   Cancer Mother 47       cervical cancer   Cancer Father 4       lung  cancer     Prior to Admission medications   Medication Sig Start Date End Date Taking? Authorizing Provider  apixaban (ELIQUIS) 5 MG TABS tablet Take 1 tablet (5 mg total) by mouth 2 (two) times daily. 10/20/18   Thurnell Lose, MD  aspirin EC 81 MG tablet Take 81 mg by mouth daily.    [provider]  Janne Lab [facility]) OINT Apply 1 application topically 2 (two) times daily. 09/02/19    [provider]  buPROPion (WELLBUTRIN) 100 MG tablet Take 100 mg by mouth 2 (two) times daily.    [provider]  Dextran 70-Hypromellose (ARTIFICIAL TEARS PF OP) Place 1-2 drops into both eyes 3 (three) times daily as needed (for dryness).    [provider]  diclofenac Sodium (VOLTAREN) 1 % GEL Apply 2 g topically 4 (four) times daily. 11/16/19   Harold Hedge, MD  diltiazem (CARDIZEM CD) 120 MG 24 hr capsule Take 1 capsule (120 mg total) by mouth daily. 10/09/19   Hosie Poisson, MD  fluticasone (FLONASE) 50 MCG/ACT nasal spray Place 1 spray into both nostrils daily as needed for allergies or rhinitis.  07/30/18   [provider]  furosemide (LASIX) 80 MG tablet Take 1 tablet (80 mg  total) by mouth 2 (two) times daily. 11/16/19 12/16/19  Harold Hedge, MD  methocarbamol (ROBAXIN) 500 MG tablet Take 500 mg by mouth 2 (two) times daily. 08/19/18   [provider]  metoprolol tartrate 75 MG TABS Take 75 mg by mouth 2 (two) times daily. 11/16/19 12/16/19  Harold Hedge, MD  pantoprazole (PROTONIX) 40 MG tablet Take 1 tablet (40 mg total) by mouth 2 (two) times daily. 10/16/18   Thurnell Lose, MD  polyethylene glycol (MIRALAX / GLYCOLAX) packet Take 17 g by mouth daily.    [provider]  potassium chloride (K-DUR,KLOR-CON) 20 MEQ tablet Take 1 tablet (20 mEq total) by mouth daily. Patient taking differently: Take 20 mEq by mouth at bedtime.  09/20/18   Strader, Fransisco Hertz, PA-C  rOPINIRole (REQUIP) 0.25 MG tablet Take 0.25 mg by mouth 2 (two) times daily.  10/09/18   [provider]  simvastatin (ZOCOR) 40 MG tablet Take 40 mg by mouth at bedtime.    [provider]  tamsulosin (FLOMAX) 0.4 MG CAPS capsule Take 0.4 mg by mouth daily. 09/02/19   [provider]  topiramate (TOPAMAX) 25 MG tablet Take 25 mg by mouth 2 (two) times daily. 09/21/18   [provider]    Physical Exam: Vitals:   11/04/21 0045  11/04/21 0100 11/04/21 0115 11/04/21 0130  BP: (!) 108/56 92/60 97/72  (!) 112/58  Pulse: 88 77 79 76  Resp: (!) 23 12 20 15   Temp:      TempSrc:      SpO2: 98% 96% 97% 98%  Weight:      Height:        Constitutional: NAD, calm, comfortable, elderly male laying in bed asleep and awoke easily to voice Vitals:   11/04/21 0045 11/04/21 0100 11/04/21 0115 11/04/21 0130  BP: (!) 108/56 92/60 97/72  (!) 112/58  Pulse: 88 77 79 76  Resp: (!) 23 12 20 15   Temp:      TempSrc:      SpO2: 98% 96% 97% 98%  Weight:      Height:       Eyes: PERRL, lids and conjunctivae normal ENMT: Mucous membranes are moist.  Neck: normal, supple Respiratory: clear to auscultation bilaterally, no wheezing, no crackles. Normal respiratory effort. No accessory muscle use.  Cardiovascular: Regular rate and rhythm, no murmurs / rubs / gallops. No extremity edema.  Abdomen: no tenderness, no masses palpated.  Bowel sounds positive.  Musculoskeletal: no clubbing / cyanosis. No joint deformity upper and lower extremities. Good ROM, no contractures. Normal muscle tone.  Skin: large 2in long sutured and stapled laceration without active bleeding on right anterior forehead. Abrasion with skin peeling on left index MCP joint.  Neurologic: CN 2-12 grossly intact. Sensation intact, Strength 5/5 in all 4.  Psychiatric: Normal judgment and insight. Alert and oriented x 3. Normal mood.     Labs on Admission: I have personally reviewed following labs and imaging studies  CBC: Recent Labs  Lab 11/03/21 1535 11/03/21 1548 11/03/21 1940 11/04/21 0000  WBC 8.4  --  8.7  --   HGB 14.7 16.0 12.9* 11.7*  HCT 44.3 47.0 39.6 36.5*  MCV 95.5  --  95.9  --   PLT 151  --  192  --    Basic Metabolic Panel: Recent Labs  Lab 11/03/21 1548  NA 139  K 5.2*  CL 103  GLUCOSE 101*  BUN 26*  CREATININE 1.30*   GFR: Estimated Creatinine  Clearance: 56 mL/min (A) (by C-G formula based on SCr of 1.3 mg/dL (H)). Liver Function  Tests: No results for input(s): AST, ALT, ALKPHOS, BILITOT, PROT, ALBUMIN in the last 168 hours. No results for input(s): LIPASE, AMYLASE in the last 168 hours. No results for input(s): AMMONIA in the last 168 hours. Coagulation Profile: No results for input(s): INR, PROTIME in the last 168 hours. Cardiac Enzymes: Recent Labs  Lab 11/04/21 0000  CKTOTAL 71   BNP (last 3 results) No results for input(s): PROBNP in the last 8760 hours. HbA1C: No results for input(s): HGBA1C in the last 72 hours. CBG: No results for input(s): GLUCAP in the last 168 hours. Lipid Profile: No results for input(s): CHOL, HDL, LDLCALC, TRIG, CHOLHDL, LDLDIRECT in the last 72 hours. Thyroid Function Tests: No results for input(s): TSH, T4TOTAL, FREET4, T3FREE, THYROIDAB in the last 72 hours. Anemia Panel: No results for input(s): VITAMINB12, FOLATE, FERRITIN, TIBC, IRON, RETICCTPCT in the last 72 hours. Urine analysis:    Component Value Date/Time   COLORURINE STRAW (A) 11/10/2019 0303   APPEARANCEUR HAZY (A) 11/10/2019 0303   LABSPEC 1.008 11/10/2019 0303   PHURINE 5.0 11/10/2019 0303   GLUCOSEU NEGATIVE 11/10/2019 0303   HGBUR SMALL (A) 11/10/2019 0303   HGBUR negative 04/08/2007 1327   BILIRUBINUR NEGATIVE 11/10/2019 0303   KETONESUR NEGATIVE 11/10/2019 0303   PROTEINUR NEGATIVE 11/10/2019 0303   UROBILINOGEN 0.2 04/24/2010 1617   NITRITE NEGATIVE 11/10/2019 0303   LEUKOCYTESUR LARGE (A) 11/10/2019 0303    Radiological Exams on Admission: CT HEAD WO CONTRAST (5MM)  Result Date: 11/03/2021 CLINICAL DATA:  80 year old male with history of head trauma after falling out of bed. EXAM: CT HEAD WITHOUT CONTRAST CT CERVICAL SPINE WITHOUT CONTRAST TECHNIQUE: Multidetector CT imaging of the head and cervical spine was performed following the standard protocol without intravenous contrast. Multiplanar CT image reconstructions of the cervical spine were also generated. COMPARISON:  Cervical spine CT scan  12/31/2019. Brain MRI 11/11/2019. Head CT 11/10/2019. Moderate cerebral atrophy. FINDINGS: CT HEAD FINDINGS Brain: Moderate cerebral atrophy. Patchy and confluent areas of decreased attenuation are noted throughout the deep and periventricular white matter of the cerebral hemispheres bilaterally, compatible with chronic microvascular ischemic disease. Chronic encephalomalacia in the inferomedial aspect of the right cerebellar hemisphere, similar to prior examinations. No evidence of acute infarction, hemorrhage, hydrocephalus, extra-axial collection or mass lesion/mass effect. Vascular: No hyperdense vessel or unexpected calcification. Skull: Orthopedic fixation hardware in the occipital bone. No acute displaced skull fractures. Sinuses/Orbits: No acute finding. Other: Extensive soft tissue swelling and gas in the soft tissues of the right frontal scalp, presumably from scalp contusion/laceration. CT CERVICAL SPINE FINDINGS Comment: Today's study is limited by considerable artifact from indwelling spinal hardware. Alignment: 2 mm of anterolisthesis of C2 upon C3 and 5 mm of anterolisthesis of C6 upon C7 both appear to be chronic. Alignment is otherwise anatomic. Skull base and vertebrae: Rod and screw fixation hardware extending from the occipital bone to the level of C7 (occipital bone to C3 and C4-C7). Status post ACDF at C6-C7. Complete bony fusion of the facet joints throughout the cervical spine. Complete bony fusion of C6-C7. No acute displaced fractures are identified. Soft tissues and spinal canal: No prevertebral fluid or swelling. No visible canal hematoma. Disc levels: Severe multilevel degenerative disc disease throughout the cervical spine and visualized upper thoracic spine, with complete bony fusion at C6-C7. Complete bony fusion of the facet joints throughout the cervical spine. Upper chest: Unremarkable. Other: None. IMPRESSION:  1. No evidence of significant acute traumatic injury to the skull,  brain or cervical spine. 2. Large right frontal scalp contusion/laceration. 3. Moderate cerebral atrophy with chronic microvascular ischemic changes in cerebral white matter and chronic encephalomalacia in the right cerebellar hemisphere, similar to prior examinations, as above. 4. Postoperative changes and chronic degenerative findings in the cervical spine, as above. Electronically Signed   By: Vinnie Langton M.D.   On: 11/03/2021 16:18   CT Cervical Spine Wo Contrast  Result Date: 11/03/2021 CLINICAL DATA:  80 year old male with history of head trauma after falling out of bed. EXAM: CT HEAD WITHOUT CONTRAST CT CERVICAL SPINE WITHOUT CONTRAST TECHNIQUE: Multidetector CT imaging of the head and cervical spine was performed following the standard protocol without intravenous contrast. Multiplanar CT image reconstructions of the cervical spine were also generated. COMPARISON:  Cervical spine CT scan 12/31/2019. Brain MRI 11/11/2019. Head CT 11/10/2019. Moderate cerebral atrophy. FINDINGS: CT HEAD FINDINGS Brain: Moderate cerebral atrophy. Patchy and confluent areas of decreased attenuation are noted throughout the deep and periventricular white matter of the cerebral hemispheres bilaterally, compatible with chronic microvascular ischemic disease. Chronic encephalomalacia in the inferomedial aspect of the right cerebellar hemisphere, similar to prior examinations. No evidence of acute infarction, hemorrhage, hydrocephalus, extra-axial collection or mass lesion/mass effect. Vascular: No hyperdense vessel or unexpected calcification. Skull: Orthopedic fixation hardware in the occipital bone. No acute displaced skull fractures. Sinuses/Orbits: No acute finding. Other: Extensive soft tissue swelling and gas in the soft tissues of the right frontal scalp, presumably from scalp contusion/laceration. CT CERVICAL SPINE FINDINGS Comment: Today's study is limited by considerable artifact from indwelling spinal hardware.  Alignment: 2 mm of anterolisthesis of C2 upon C3 and 5 mm of anterolisthesis of C6 upon C7 both appear to be chronic. Alignment is otherwise anatomic. Skull base and vertebrae: Rod and screw fixation hardware extending from the occipital bone to the level of C7 (occipital bone to C3 and C4-C7). Status post ACDF at C6-C7. Complete bony fusion of the facet joints throughout the cervical spine. Complete bony fusion of C6-C7. No acute displaced fractures are identified. Soft tissues and spinal canal: No prevertebral fluid or swelling. No visible canal hematoma. Disc levels: Severe multilevel degenerative disc disease throughout the cervical spine and visualized upper thoracic spine, with complete bony fusion at C6-C7. Complete bony fusion of the facet joints throughout the cervical spine. Upper chest: Unremarkable. Other: None. IMPRESSION: 1. No evidence of significant acute traumatic injury to the skull, brain or cervical spine. 2. Large right frontal scalp contusion/laceration. 3. Moderate cerebral atrophy with chronic microvascular ischemic changes in cerebral white matter and chronic encephalomalacia in the right cerebellar hemisphere, similar to prior examinations, as above. 4. Postoperative changes and chronic degenerative findings in the cervical spine, as above. Electronically Signed   By: Vinnie Langton M.D.   On: 11/03/2021 16:18   DG Pelvis Portable  Result Date: 11/03/2021 CLINICAL DATA:  Status post fall. EXAM: PORTABLE PELVIS 1-2 VIEWS COMPARISON:  None. FINDINGS: There is no evidence of pelvic fracture or diastasis. No pelvic bone lesions are seen. IMPRESSION: Negative. Electronically Signed   By: Virgina Norfolk M.D.   On: 11/03/2021 15:53   CT CHEST ABDOMEN PELVIS W CONTRAST  Result Date: 11/03/2021 CLINICAL DATA:  Abdominal trauma.  Status post fall EXAM: CT CHEST, ABDOMEN, AND PELVIS WITH CONTRAST TECHNIQUE: Multidetector CT imaging of the chest, abdomen and pelvis was performed  following the standard protocol during bolus administration of intravenous contrast. CONTRAST:  14mL OMNIPAQUE  IOHEXOL 350 MG/ML SOLN COMPARISON:  None. FINDINGS: CHEST: Ports and Devices: None. Lungs/airways: No focal consolidation. Punctate calcified pulmonary micronodules. No pulmonary mass. No pulmonary contusion or laceration. No pneumatocele formation. The central airways are patent. Mild diffuse bronchial wall thickening. Pleura: No pleural effusion. No pneumothorax. No hemothorax. Lymph Nodes: No mediastinal, hilar, or axillary lymphadenopathy. Mediastinum: No pneumomediastinum. No aortic injury or mediastinal hematoma. The thoracic aorta is normal in caliber. Status post aortic valve replacement. Atherosclerotic plaque. Enlarged left atrium. No significant pericardial effusion. The main pulmonary artery is normal in caliber. No central pulmonary embolus. The esophagus is unremarkable. The thyroid is unremarkable. Chest Wall / Breasts: No chest wall mass.  Bilateral gynecomastia. Musculoskeletal: No acute rib or sternal fracture. Old healed left rib fractures. No spinal fracture. Multilevel severe degenerative changes of the spine. Lower cervical spine surgical hardware-please see separately dictated CT cervical spine 11/03/2021. T10-T11 posterolateral fusion surgical hardware. ABDOMEN / PELVIS: Liver: Not enlarged. No focal lesion. No laceration or subcapsular hematoma. Biliary System: The gallbladder is otherwise unremarkable with no radio-opaque gallstones. No biliary ductal dilatation. Pancreas: Normal pancreatic contour. No main pancreatic duct dilatation. Spleen: Not enlarged. No focal lesion. No laceration, subcapsular hematoma, or vascular injury. Adrenal Glands: No nodularity bilaterally. Kidneys: Bilateral kidneys enhance symmetrically. No hydronephrosis. No contusion, laceration, or subcapsular hematoma. Punctate calcified stone within the right kidney. There is a 2.4 cm fluid density lesion  within the right kidney that likely represents a simple renal cyst. Pericentimeter fluid density lesion within left kidney likely represents a simple renal cyst. Indeterminate slightly more conspicuous 1 cm left renal cyst with a density of 30 Hounsfield units. No injury to the vascular structures or collecting systems. No hydroureter. The urinary bladder is decompressed with circumferential urinary bladder wall thickening likely due to under distension. Urinary bladder diverticula noted. Bowel: Gastric surgical changes. No small or large bowel wall thickening or dilatation. The appendix is unremarkable. Mesentery, Omentum, and Peritoneum: No simple free fluid ascites. No pneumoperitoneum. No hemoperitoneum. No mesenteric hematoma identified. No organized fluid collection. Pelvic Organs: Normal. Lymph Nodes: No abdominal, pelvic, inguinal lymphadenopathy. Vasculature: Atherosclerotic plaque. No abdominal aorta or iliac aneurysm. No active contrast extravasation or pseudoaneurysm. Musculoskeletal: No significant soft tissue hematoma. Small fat containing supraumbilical ventral hernias with an abdominal defect of 2 cm (7:63) as well as 1.1 cm (7:71). No acute pelvic fracture. No spinal fracture. Multilevel severe degenerative changes of the spine. IMPRESSION: 1. No acute traumatic injury to the chest, abdomen, or pelvis. 2. No acute fracture or traumatic malalignment of the thoracic or lumbar spine. Other imaging findings of potential clinical significance: 1. Indeterminate slightly more conspicuous 1 cm left renal cyst with a density of 30 Hounsfield units. Recommend MRI renal protocol for further evaluation. When the patient is clinically stable and able to follow directions and hold their breath (preferably as an outpatient) further evaluation with dedicated abdominal MRI should be considered. 2. Nonobstructive punctate right nephrolithiasis. 3. Fat containing supraumbilical ventral hernias. 4. The circumferential  urinary bladder wall thickening likely due to under distension. Urinary bladder diverticula noted. 5. Left atrial enlargement. Electronically Signed   By: Iven Finn M.D.   On: 11/03/2021 20:59   DG Chest Portable 1 View  Result Date: 11/03/2021 CLINICAL DATA:  Status post fall. EXAM: PORTABLE CHEST 1 VIEW COMPARISON:  None. FINDINGS: The heart size and mediastinal contours are within normal limits. An artificial aortic valve is seen. Both lungs are clear. Postoperative changes are noted within the cervical  spine and lower thoracic spine. Degenerative changes are also seen involving the thoracic spine and bilateral shoulders. IMPRESSION: No active disease. Electronically Signed   By: Virgina Norfolk M.D.   On: 11/03/2021 15:52   DG Hip Unilat W or Wo Pelvis 2-3 Views Right  Result Date: 11/03/2021 CLINICAL DATA:  Fall. EXAM: DG HIP (WITH OR WITHOUT PELVIS) 2-3V RIGHT COMPARISON:  None. FINDINGS: There is no acute fracture or dislocation. There are mild degenerative changes of the right hip. There are vascular calcifications in the soft tissues. IMPRESSION: No acute bony abnormality. Electronically Signed   By: Ronney Asters M.D.   On: 11/03/2021 16:25      Assessment/Plan  Hypotension following mechanical fall with bleeding laceration -pt on Eliquis and was bleeding for at least 2 hours from large right anterior forehard laceration until he was found by family. Denies LOC. No other source of trauma/bleed on CT head, cervical spine and abdomen. -Hemoglobin initially 14.7 down to 12.9 following a liter bolus. Now at 11.7. Will continue to follow H/H in the morning.  -Hold Eliquis overnight  -continuous IV gentle fluid hydration overnight  -hold antihypertensives  Right anterior forehead laceration Sutured and stable in the ED.  Active bleeding noted.  Will need follow-up outpatient for suture removal  AKI - Mildly elevated creatinine of 1.3 from prior of 1.10 - Repeat creatinine in  the morning following IV fluid hydration  Hyperkalemia - Mildly elevated potassium of 5.2.  Suspect due to hypotension.  Follow with repeat in the mornings after fluids  Chronic diastolic heart failure Compensated.  Monitor closely while on continuous IV fluid  Paroxysmal atrial fibrillation - Hold Eliquis overnight due to significant bleeding from laceration  Right renal cyst - Incidental 1 cm renal cyst noted on CT abdomen.  Will need outpatient.  Chronic pain - Continue gabapentin and as needed oxycodone/acetaminophen  DVT prophylaxis:.Lovenox Code Status: Full Family Communication: Plan discussed with patient at bedside  disposition Plan: Home with observation Consults called: Trauma surgery Admission status: Observation    Level of care: Telemetry Cardiac  Status is: Observation  The patient remains OBS appropriate and will d/c before 2 midnights.        Orene Desanctis DO [facility]   If 7PM-7AM, please contact night-coverage [email or web address]   11/04/2021, 1:48 AM

## 2021-11-05 DIAGNOSIS — I5032 Chronic diastolic (congestive) heart failure: Secondary | ICD-10-CM | POA: Diagnosis not present

## 2021-11-05 DIAGNOSIS — W19XXXA Unspecified fall, initial encounter: Secondary | ICD-10-CM | POA: Diagnosis not present

## 2021-11-05 DIAGNOSIS — N179 Acute kidney failure, unspecified: Secondary | ICD-10-CM | POA: Diagnosis not present

## 2021-11-05 MED ORDER — APIXABAN 5 MG PO TABS
5.0000 mg | ORAL_TABLET | Freq: Two times a day (BID) | ORAL | Status: DC
  Administered 2021-11-06: 5 mg via ORAL
  Filled 2021-11-05 (×2): qty 1

## 2021-11-05 MED FILL — Metoprolol Tartrate Tab 25 MG: ORAL | Qty: 3 | Status: AC

## 2021-11-05 NOTE — Progress Notes (Signed)
[facility] PROGRESS NOTE    Progress Note  [name]  TLX:[id number] DOB: 02/16/1941 DOA: 11/03/2021 PCP: Hayden Rasmussen, MD     Brief Narrative:   [name] is an 80 y.o. male past medical history significant for permanent atrial fibrillation on Eliquis, CVA, history of GI bleed, chronic diastolic heart failure who was been dealing with neck pain for quite a while he has had a thoracic spine spinal fusion in the past when he turned his head and fell and hit the dresser.  Was found hypotensive in the ED which responded to fluid resuscitation trauma was consulted just recommended observation with a CT of the head cervical spine and abdomen which did not reveal any trauma.  Initially his hemoglobin was 14.7 repeated 1 after fluid resuscitation was 13    Assessment/Plan:   Hypotension following mechanical fall with a bleeding laceration of the scalp: Patient is on Eliquis at home was bleeding he relates for at least 2 hours.  Laceration has been repaired by the ED. CT of the head and C-spine showed no acute intracranial findings or C-spine finding.   Resume Eliquis tomorrow KVO IV fluids.  Blood pressure is stable. Physical therapy evaluation is pending.  Right anterior forehead laceration: Will need to follow-up with PCP as an outpatient for suture removal. No further bleeding continue to hold Eliquis.  Acute kidney injury: Likely prerenal azotemia resolved with IV fluid hydration KVO IV fluids.  Hyperkalemia: Likely due to hypotension with hypovolemia resolved with IV fluid hydration.  Acute blood loss anemia/normocytic anemia: In the setting of Eliquis likely due to a laceration of his forehead has been repaired continue to hold Eliquis for the next 24 hours.  Chronic diastolic heart failure: Appears to be compensated we will decrease in KVO IV fluids over the next several hours. Resume metoprolol continue to hold diuretic and diltiazem.  Incidental renal  cyst: Noted on CT scan will need to follow-up as an outpatient.  Chronic pain: Continue gabapentin and [place] as needed.  Decubitus ulcer stage III present on admission: RN Pressure Injury Documentation:     DVT prophylaxis: scd Family Communication:none Status is: Observation  The patient remains OBS appropriate and will d/c before 2 midnights.       Code Status:     Code Status Orders  (From admission, onward)           Start     Ordered   11/04/21 0147  Full code  Continuous        11/04/21 0147           Code Status History     Date Active Date Inactive Code Status Order ID Comments User Context   11/10/2019 0800 11/16/2019 1848 Full Code [id number]  [name], MD ED   10/06/2019 2053 10/08/2019 1959 Full Code [id number]  Shela Leff, MD ED   10/13/2018 1608 10/16/2018 1934 Full Code [id number]  Karmen Bongo, MD ED   09/18/2018 2134 09/20/2018 1940 Full Code [id number]  Theora Gianotti, NP Inpatient         IV Access:   Peripheral IV   Procedures and diagnostic studies:   CT HEAD WO CONTRAST (5MM)  Result Date: 11/03/2021 CLINICAL DATA:  80 year old male with history of head trauma after falling out of bed. EXAM: CT HEAD WITHOUT CONTRAST CT CERVICAL SPINE WITHOUT CONTRAST TECHNIQUE: Multidetector CT imaging of the head and cervical spine was performed following the standard protocol without intravenous contrast. Multiplanar CT  image reconstructions of the cervical spine were also generated. COMPARISON:  Cervical spine CT scan 12/31/2019. Brain MRI 11/11/2019. Head CT 11/10/2019. Moderate cerebral atrophy. FINDINGS: CT HEAD FINDINGS Brain: Moderate cerebral atrophy. Patchy and confluent areas of decreased attenuation are noted throughout the deep and periventricular white matter of the cerebral hemispheres bilaterally, compatible with chronic microvascular ischemic disease. Chronic encephalomalacia in the inferomedial aspect of  the right cerebellar hemisphere, similar to prior examinations. No evidence of acute infarction, hemorrhage, hydrocephalus, extra-axial collection or mass lesion/mass effect. Vascular: No hyperdense vessel or unexpected calcification. Skull: Orthopedic fixation hardware in the occipital bone. No acute displaced skull fractures. Sinuses/Orbits: No acute finding. Other: Extensive soft tissue swelling and gas in the soft tissues of the right frontal scalp, presumably from scalp contusion/laceration. CT CERVICAL SPINE FINDINGS Comment: Today's study is limited by considerable artifact from indwelling spinal hardware. Alignment: 2 mm of anterolisthesis of C2 upon C3 and 5 mm of anterolisthesis of C6 upon C7 both appear to be chronic. Alignment is otherwise anatomic. Skull base and vertebrae: Rod and screw fixation hardware extending from the occipital bone to the level of C7 (occipital bone to C3 and C4-C7). Status post ACDF at C6-C7. Complete bony fusion of the facet joints throughout the cervical spine. Complete bony fusion of C6-C7. No acute displaced fractures are identified. Soft tissues and spinal canal: No prevertebral fluid or swelling. No visible canal hematoma. Disc levels: Severe multilevel degenerative disc disease throughout the cervical spine and visualized upper thoracic spine, with complete bony fusion at C6-C7. Complete bony fusion of the facet joints throughout the cervical spine. Upper chest: Unremarkable. Other: None. IMPRESSION: 1. No evidence of significant acute traumatic injury to the skull, brain or cervical spine. 2. Large right frontal scalp contusion/laceration. 3. Moderate cerebral atrophy with chronic microvascular ischemic changes in cerebral white matter and chronic encephalomalacia in the right cerebellar hemisphere, similar to prior examinations, as above. 4. Postoperative changes and chronic degenerative findings in the cervical spine, as above. Electronically Signed   By: Vinnie Langton M.D.   On: 11/03/2021 16:18   CT Cervical Spine Wo Contrast  Result Date: 11/03/2021 CLINICAL DATA:  80 year old male with history of head trauma after falling out of bed. EXAM: CT HEAD WITHOUT CONTRAST CT CERVICAL SPINE WITHOUT CONTRAST TECHNIQUE: Multidetector CT imaging of the head and cervical spine was performed following the standard protocol without intravenous contrast. Multiplanar CT image reconstructions of the cervical spine were also generated. COMPARISON:  Cervical spine CT scan 12/31/2019. Brain MRI 11/11/2019. Head CT 11/10/2019. Moderate cerebral atrophy. FINDINGS: CT HEAD FINDINGS Brain: Moderate cerebral atrophy. Patchy and confluent areas of decreased attenuation are noted throughout the deep and periventricular white matter of the cerebral hemispheres bilaterally, compatible with chronic microvascular ischemic disease. Chronic encephalomalacia in the inferomedial aspect of the right cerebellar hemisphere, similar to prior examinations. No evidence of acute infarction, hemorrhage, hydrocephalus, extra-axial collection or mass lesion/mass effect. Vascular: No hyperdense vessel or unexpected calcification. Skull: Orthopedic fixation hardware in the occipital bone. No acute displaced skull fractures. Sinuses/Orbits: No acute finding. Other: Extensive soft tissue swelling and gas in the soft tissues of the right frontal scalp, presumably from scalp contusion/laceration. CT CERVICAL SPINE FINDINGS Comment: Today's study is limited by considerable artifact from indwelling spinal hardware. Alignment: 2 mm of anterolisthesis of C2 upon C3 and 5 mm of anterolisthesis of C6 upon C7 both appear to be chronic. Alignment is otherwise anatomic. Skull base and vertebrae: Rod and screw fixation hardware extending from  the occipital bone to the level of C7 (occipital bone to C3 and C4-C7). Status post ACDF at C6-C7. Complete bony fusion of the facet joints throughout the cervical spine. Complete bony  fusion of C6-C7. No acute displaced fractures are identified. Soft tissues and spinal canal: No prevertebral fluid or swelling. No visible canal hematoma. Disc levels: Severe multilevel degenerative disc disease throughout the cervical spine and visualized upper thoracic spine, with complete bony fusion at C6-C7. Complete bony fusion of the facet joints throughout the cervical spine. Upper chest: Unremarkable. Other: None. IMPRESSION: 1. No evidence of significant acute traumatic injury to the skull, brain or cervical spine. 2. Large right frontal scalp contusion/laceration. 3. Moderate cerebral atrophy with chronic microvascular ischemic changes in cerebral white matter and chronic encephalomalacia in the right cerebellar hemisphere, similar to prior examinations, as above. 4. Postoperative changes and chronic degenerative findings in the cervical spine, as above. Electronically Signed   By: Vinnie Langton M.D.   On: 11/03/2021 16:18   DG Pelvis Portable  Result Date: 11/03/2021 CLINICAL DATA:  Status post fall. EXAM: PORTABLE PELVIS 1-2 VIEWS COMPARISON:  None. FINDINGS: There is no evidence of pelvic fracture or diastasis. No pelvic bone lesions are seen. IMPRESSION: Negative. Electronically Signed   By: Virgina Norfolk M.D.   On: 11/03/2021 15:53   CT CHEST ABDOMEN PELVIS W CONTRAST  Result Date: 11/03/2021 CLINICAL DATA:  Abdominal trauma.  Status post fall EXAM: CT CHEST, ABDOMEN, AND PELVIS WITH CONTRAST TECHNIQUE: Multidetector CT imaging of the chest, abdomen and pelvis was performed following the standard protocol during bolus administration of intravenous contrast. CONTRAST:  135mL OMNIPAQUE IOHEXOL 350 MG/ML SOLN COMPARISON:  None. FINDINGS: CHEST: Ports and Devices: None. Lungs/airways: No focal consolidation. Punctate calcified pulmonary micronodules. No pulmonary mass. No pulmonary contusion or laceration. No pneumatocele formation. The central airways are patent. Mild diffuse bronchial  wall thickening. Pleura: No pleural effusion. No pneumothorax. No hemothorax. Lymph Nodes: No mediastinal, hilar, or axillary lymphadenopathy. Mediastinum: No pneumomediastinum. No aortic injury or mediastinal hematoma. The thoracic aorta is normal in caliber. Status post aortic valve replacement. Atherosclerotic plaque. Enlarged left atrium. No significant pericardial effusion. The main pulmonary artery is normal in caliber. No central pulmonary embolus. The esophagus is unremarkable. The thyroid is unremarkable. Chest Wall / Breasts: No chest wall mass.  Bilateral gynecomastia. Musculoskeletal: No acute rib or sternal fracture. Old healed left rib fractures. No spinal fracture. Multilevel severe degenerative changes of the spine. Lower cervical spine surgical hardware-please see separately dictated CT cervical spine 11/03/2021. T10-T11 posterolateral fusion surgical hardware. ABDOMEN / PELVIS: Liver: Not enlarged. No focal lesion. No laceration or subcapsular hematoma. Biliary System: The gallbladder is otherwise unremarkable with no radio-opaque gallstones. No biliary ductal dilatation. Pancreas: Normal pancreatic contour. No main pancreatic duct dilatation. Spleen: Not enlarged. No focal lesion. No laceration, subcapsular hematoma, or vascular injury. Adrenal Glands: No nodularity bilaterally. Kidneys: Bilateral kidneys enhance symmetrically. No hydronephrosis. No contusion, laceration, or subcapsular hematoma. Punctate calcified stone within the right kidney. There is a 2.4 cm fluid density lesion within the right kidney that likely represents a simple renal cyst. Pericentimeter fluid density lesion within left kidney likely represents a simple renal cyst. Indeterminate slightly more conspicuous 1 cm left renal cyst with a density of 30 Hounsfield units. No injury to the vascular structures or collecting systems. No hydroureter. The urinary bladder is decompressed with circumferential urinary bladder wall  thickening likely due to under distension. Urinary bladder diverticula noted. Bowel: Gastric surgical changes. No  small or large bowel wall thickening or dilatation. The appendix is unremarkable. Mesentery, Omentum, and Peritoneum: No simple free fluid ascites. No pneumoperitoneum. No hemoperitoneum. No mesenteric hematoma identified. No organized fluid collection. Pelvic Organs: Normal. Lymph Nodes: No abdominal, pelvic, inguinal lymphadenopathy. Vasculature: Atherosclerotic plaque. No abdominal aorta or iliac aneurysm. No active contrast extravasation or pseudoaneurysm. Musculoskeletal: No significant soft tissue hematoma. Small fat containing supraumbilical ventral hernias with an abdominal defect of 2 cm (7:63) as well as 1.1 cm (7:71). No acute pelvic fracture. No spinal fracture. Multilevel severe degenerative changes of the spine. IMPRESSION: 1. No acute traumatic injury to the chest, abdomen, or pelvis. 2. No acute fracture or traumatic malalignment of the thoracic or lumbar spine. Other imaging findings of potential clinical significance: 1. Indeterminate slightly more conspicuous 1 cm left renal cyst with a density of 30 Hounsfield units. Recommend MRI renal protocol for further evaluation. When the patient is clinically stable and able to follow directions and hold their breath (preferably as an outpatient) further evaluation with dedicated abdominal MRI should be considered. 2. Nonobstructive punctate right nephrolithiasis. 3. Fat containing supraumbilical ventral hernias. 4. The circumferential urinary bladder wall thickening likely due to under distension. Urinary bladder diverticula noted. 5. Left atrial enlargement. Electronically Signed   By: Iven Finn M.D.   On: 11/03/2021 20:59   DG Chest Portable 1 View  Result Date: 11/03/2021 CLINICAL DATA:  Status post fall. EXAM: PORTABLE CHEST 1 VIEW COMPARISON:  None. FINDINGS: The heart size and mediastinal contours are within normal limits. An  artificial aortic valve is seen. Both lungs are clear. Postoperative changes are noted within the cervical spine and lower thoracic spine. Degenerative changes are also seen involving the thoracic spine and bilateral shoulders. IMPRESSION: No active disease. Electronically Signed   By: Virgina Norfolk M.D.   On: 11/03/2021 15:52   DG Hip Unilat W or Wo Pelvis 2-3 Views Right  Result Date: 11/03/2021 CLINICAL DATA:  Fall. EXAM: DG HIP (WITH OR WITHOUT PELVIS) 2-3V RIGHT COMPARISON:  None. FINDINGS: There is no acute fracture or dislocation. There are mild degenerative changes of the right hip. There are vascular calcifications in the soft tissues. IMPRESSION: No acute bony abnormality. Electronically Signed   By: Ronney Asters M.D.   On: 11/03/2021 16:25     Medical Consultants:   None.   Subjective:    [name] no complaints pain is controlled.  Objective:    Vitals:   11/04/21 2015 11/04/21 2338 11/05/21 0455 11/05/21 0848  BP: (!) 95/56 (!) 100/54 94/62 (!) 100/58  Pulse: 74 75 91 76  Resp: 14 14 14 16   Temp: 97.7 F (36.5 C) 98 F (36.7 C) 97.9 F (36.6 C) 97.7 F (36.5 C)  TempSrc: Oral Axillary Oral Oral  SpO2: 99% 97% 100% 100%  Weight:      Height:       SpO2: 100 %   Intake/Output Summary (Last 24 hours) at 11/05/2021 0937 Last data filed at 11/05/2021 0849 Gross per 24 hour  Intake 50 ml  Output 925 ml  Net -875 ml    Filed Weights   11/03/21 1534  Weight: 109 kg    Exam: General exam: In no acute distress, traumatic head Respiratory system: Good air movement and clear to auscultation. Cardiovascular system: S1 & S2 heard, RRR. No JVD. Gastrointestinal system: Abdomen is nondistended, soft and nontender.  Extremities: No pedal edema. Skin: No rashes, lesions or ulcers Psychiatry: Judgement and insight appear normal. Mood &  affect appropriate.   Data Reviewed:    Labs: Basic Metabolic Panel: Recent Labs  Lab 11/03/21 1548  11/04/21 0350  NA 139 137  K 5.2* 3.9  CL 103 107  CO2  --  26  GLUCOSE 101* 121*  BUN 26* 19  CREATININE 1.30* 1.10  CALCIUM  --  8.6*    GFR Estimated Creatinine Clearance: 66.2 mL/min (by C-G formula based on SCr of 1.1 mg/dL). Liver Function Tests: No results for input(s): AST, ALT, ALKPHOS, BILITOT, PROT, ALBUMIN in the last 168 hours. No results for input(s): LIPASE, AMYLASE in the last 168 hours. No results for input(s): AMMONIA in the last 168 hours. Coagulation profile No results for input(s): INR, PROTIME in the last 168 hours. COVID-19 Labs  No results for input(s): DDIMER, FERRITIN, LDH, CRP in the last 72 hours.  Lab Results  Component Value Date   SARSCOV2NAA NEGATIVE 11/03/2021   SARSCOV2NAA NEGATIVE 11/10/2019   [place] NEGATIVE 10/06/2019    CBC: Recent Labs  Lab 11/03/21 1535 11/03/21 1548 11/03/21 1940 11/04/21 0000 11/04/21 0350  WBC 8.4  --  8.7  --  8.4  HGB 14.7 16.0 12.9* 11.7* 11.8*  HCT 44.3 47.0 39.6 36.5* 36.3*  MCV 95.5  --  95.9  --  96.5  PLT 151  --  192  --  171    Cardiac Enzymes: Recent Labs  Lab 11/04/21 0000  CKTOTAL 71    BNP (last 3 results) No results for input(s): PROBNP in the last 8760 hours. CBG: No results for input(s): GLUCAP in the last 168 hours. D-Dimer: No results for input(s): DDIMER in the last 72 hours. Hgb A1c: No results for input(s): HGBA1C in the last 72 hours. Lipid Profile: No results for input(s): CHOL, HDL, LDLCALC, TRIG, CHOLHDL, LDLDIRECT in the last 72 hours. Thyroid function studies: No results for input(s): TSH, T4TOTAL, T3FREE, THYROIDAB in the last 72 hours.  Invalid input(s): FREET3 Anemia work up: No results for input(s): VITAMINB12, FOLATE, FERRITIN, TIBC, IRON, RETICCTPCT in the last 72 hours. Sepsis Labs: Recent Labs  Lab 11/03/21 1535 11/03/21 1940 11/04/21 0350  WBC 8.4 8.7 8.4    Microbiology Recent Results (from the past 240 hour(s))  Resp Panel by RT-PCR  (Flu A&B, Covid) Nasopharyngeal Swab     Status: None   Collection Time: 11/03/21 11:55 PM   Specimen: Nasopharyngeal Swab; Nasopharyngeal(NP) swabs in vial transport medium  Result Value Ref Range Status   SARS Coronavirus 2 by RT PCR NEGATIVE NEGATIVE Final    Comment: (NOTE) SARS-CoV-2 target nucleic acids are NOT DETECTED.  The SARS-CoV-2 RNA is generally detectable in upper respiratory specimens during the acute phase of infection. The lowest concentration of SARS-CoV-2 viral copies this assay can detect is 138 copies/mL. A negative result does not preclude SARS-Cov-2 infection and should not be used as the sole basis for treatment or other patient management decisions. A negative result may occur with  improper specimen collection/handling, submission of specimen other than nasopharyngeal swab, presence of viral mutation(s) within the areas targeted by this assay, and inadequate number of viral copies(<138 copies/mL). A negative result must be combined with clinical observations, patient history, and epidemiological information. The expected result is Negative.  Fact Sheet for Patients:  EntrepreneurPulse.com.au  Fact Sheet for Healthcare Providers:  IncredibleEmployment.be  This test is no t yet approved or cleared by the [place] FDA and  has been authorized for detection and/or diagnosis of SARS-CoV-2 by FDA under an Emergency Use Authorization (  EUA). This EUA will remain  in effect (meaning this test can be used) for the duration of the COVID-19 declaration under Section 564(b)(1) of the Act, 21 U.S.C.section 360bbb-3(b)(1), unless the authorization is terminated  or revoked sooner.       Influenza A by PCR NEGATIVE NEGATIVE Final   Influenza B by PCR NEGATIVE NEGATIVE Final    Comment: (NOTE) The Xpert Xpress SARS-CoV-2/FLU/RSV plus assay is intended as an aid in the diagnosis of influenza from Nasopharyngeal swab specimens  and should not be used as a sole basis for treatment. Nasal washings and aspirates are unacceptable for Xpert Xpress SARS-CoV-2/FLU/RSV testing.  Fact Sheet for Patients: EntrepreneurPulse.com.au  Fact Sheet for Healthcare Providers: IncredibleEmployment.be  This test is not yet approved or cleared by the [place] FDA and has been authorized for detection and/or diagnosis of SARS-CoV-2 by FDA under an Emergency Use Authorization (EUA). This EUA will remain in effect (meaning this test can be used) for the duration of the COVID-19 declaration under Section 564(b)(1) of the Act, 21 U.S.C. section 360bbb-3(b)(1), unless the authorization is terminated or revoked.  Performed at [facility], [place] [address] [place], [place] [address]      Medications:    aspirin EC  81 mg Oral Daily   buPROPion  100 mg Oral BID WC   gabapentin  600 mg Oral TID   metoprolol tartrate  12.5 mg Oral BID   pantoprazole  40 mg Oral BID   polyethylene glycol  17 g Oral Daily   rOPINIRole  0.25 mg Oral BID   simvastatin  40 mg Oral QHS   topiramate  25 mg Oral BID   Continuous Infusions:      LOS: 0 days   Charlynne Cousins  [facility]  11/05/2021, 9:37 AM

## 2021-11-05 NOTE — Evaluation (Signed)
Physical Therapy Evaluation Patient Details Name: [name] MRN: [id number] DOB: October 24, 1941 Today's Date: 11/05/2021  History of Present Illness  80 y.o. male presents to [facility] hospital on 11/03/2021 after fall. Pt found to be hypotensive with laceration of scalp and forehead. Pt also with AKI and hyperkalemia. PMH includes Afib, CVA, GIB, diastolic heart failure.  Clinical Impression  Pt presents to PT with deficits in gait, balance, endurance, however per his report is likely not far from baseline. Pt reports tightness in neck and back however also states that cervical range has been significantly limited since his neck fusion. Pt demonstrates mild instability during gait without overt loss of balance. PT encourages use of walker, however pt reports this makes him less stable. PT recommends discharge home with HHPT for further balance training.       Recommendations for follow up therapy are one component of a multi-disciplinary discharge planning process, led by the attending physician.  Recommendations may be updated based on patient status, additional functional criteria and insurance authorization.  Follow Up Recommendations Home health PT    Assistance Recommended at Discharge Intermittent Supervision/Assistance  Functional Status Assessment Patient has had a recent decline in their functional status and demonstrates the ability to make significant improvements in function in a reasonable and predictable amount of time.  Equipment Recommendations   (PT recommends use of RW however pt likely to not use based on personal preference)    Recommendations for Other Services       Precautions / Restrictions Precautions Precautions: Fall Restrictions Weight Bearing Restrictions: No      Mobility  Bed Mobility Overal bed mobility: Needs Assistance Bed Mobility: Supine to Sit     Supine to sit: Supervision;HOB elevated          Transfers Overall transfer level: Needs  assistance Equipment used: Straight cane Transfers: Sit to/from Stand Sit to Stand: Supervision                Ambulation/Gait Ambulation/Gait assistance: Supervision Gait Distance (Feet): 150 Feet Assistive device: Straight cane Gait Pattern/deviations: Step-to pattern;Wide base of support Gait velocity: reduced Gait velocity interpretation: <1.8 ft/sec, indicate of risk for recurrent falls   General Gait Details: pt with short waddling steps, reduced foot clearance and step length  Stairs            Wheelchair Mobility    Modified Rankin (Stroke Patients Only)       Balance Overall balance assessment: Needs assistance Sitting-balance support: No upper extremity supported;Feet supported Sitting balance-Leahy Scale: Good     Standing balance support: Single extremity supported Standing balance-Leahy Scale: Poor Standing balance comment: reliant on UE support of cane                             Pertinent Vitals/Pain Pain Assessment: No/denies pain    Home Living Family/patient expects to be discharged to:: Private residence Living Arrangements: Other relatives (sister) Available Help at Discharge: Family;Available 24 hours/day Type of Home: House Home Access: Level entry       Home Layout: One level Home Equipment: Cane - single Barista (2 wheels);Shower seat;Grab bars - toilet;Grab bars - tub/shower      Prior Function Prior Level of Function : Independent/Modified Independent             Mobility Comments: pt ambulates with cane, reports he feels the walker makes him less stable than cane  Hand Dominance   Dominant Hand: Right    Extremity/Trunk Assessment   Upper Extremity Assessment Upper Extremity Assessment: Overall WFL for tasks assessed    Lower Extremity Assessment Lower Extremity Assessment: Overall WFL for tasks assessed    Cervical / Trunk Assessment Cervical / Trunk Assessment: Other  exceptions (prior neck surgery, pt with significantly limited cervical ROM)  Communication   Communication: HOH  Cognition Arousal/Alertness: Awake/alert Behavior During Therapy: WFL for tasks assessed/performed Overall Cognitive Status: No family/caregiver present to determine baseline cognitive functioning                                 General Comments: likely baseline, follows commands        General Comments General comments (skin integrity, edema, etc.): VSS on RA    Exercises     Assessment/Plan    PT Assessment Patient needs continued PT services  PT Problem List Decreased activity tolerance;Decreased balance;Decreased mobility;Decreased strength       PT Treatment Interventions DME instruction;Gait training;Functional mobility training;Therapeutic activities;Therapeutic exercise;Balance training;Neuromuscular re-education;Patient/family education    PT Goals (Current goals can be found in the Care Plan section)  Acute Rehab PT Goals Patient Stated Goal: to go home PT Goal Formulation: With patient Time For Goal Achievement: 11/19/21 Potential to Achieve Goals: Fair Additional Goals Additional Goal #1: Pt will score >19/24 on DGI to indicate a reduced risk for falls    Frequency Min 3X/week   Barriers to discharge        Co-evaluation               AM-PAC PT "6 Clicks" Mobility  Outcome Measure Help needed turning from your back to your side while in a flat bed without using bedrails?: A Little Help needed moving from lying on your back to sitting on the side of a flat bed without using bedrails?: A Little Help needed moving to and from a bed to a chair (including a wheelchair)?: A Little Help needed standing up from a chair using your arms (e.g., wheelchair or bedside chair)?: A Little Help needed to walk in hospital room?: A Little Help needed climbing 3-5 steps with a railing? : A Lot 6 Click Score: 17    End of Session    Activity Tolerance: Patient tolerated treatment well Patient left: in chair;with call bell/phone within reach Nurse Communication: Mobility status PT Visit Diagnosis: Other abnormalities of gait and mobility (R26.89)    Time: 7416-3845 PT Time Calculation (min) (ACUTE ONLY): 21 min   Charges:   PT Evaluation $PT Eval Low Complexity: [place], PT, DPT Acute Rehabilitation Pager: [phone] Office [phone]   Zenaida Niece 11/05/2021, 5:05 PM

## 2021-11-06 DIAGNOSIS — N179 Acute kidney failure, unspecified: Secondary | ICD-10-CM | POA: Diagnosis not present

## 2021-11-06 DIAGNOSIS — E875 Hyperkalemia: Secondary | ICD-10-CM | POA: Diagnosis not present

## 2021-11-06 DIAGNOSIS — W19XXXA Unspecified fall, initial encounter: Secondary | ICD-10-CM | POA: Diagnosis not present

## 2021-11-06 DIAGNOSIS — I480 Paroxysmal atrial fibrillation: Secondary | ICD-10-CM | POA: Diagnosis not present

## 2021-11-06 NOTE — Progress Notes (Signed)
[name] to be D/C'd  per MD order.  Discussed with the patient and all questions fully answered.  VSS, Skin clean, dry and intact without evidence of skin break down, no evidence of skin tears noted.  IV catheter discontinued intact. Site without signs and symptoms of complications. Dressing and pressure applied.  An After Visit Summary was printed and given to the patient.  D/c  re-education completed with patient/family including follow up instructions, medication list, d/c activities limitations if indicated, with other d/c instructions as indicated by MD - patient able to verbalize understanding, all questions fully answered.   Patient instructed to return to ED, call 911, or call MD for any changes in condition.   Patient to be escorted via [place], and D/C home via private auto.

## 2021-11-06 NOTE — Progress Notes (Signed)
Physical Therapy Treatment Patient Details Name: [name] MRN: [id number] DOB: September 03, 1941 Today's Date: 11/06/2021   History of Present Illness 80 y.o. male presents to [facility] hospital on 11/03/2021 after fall. Pt found to be hypotensive with laceration of scalp and forehead. Pt also with AKI and hyperkalemia. PMH includes Afib, CVA, GIB, diastolic heart failure.    PT Comments    Pt admitted with above diagnosis. Pt ambulates with cane with min guard to supervision. Pt appears to be close to baseline. Feel pt would be safest to use RW but pt refuses to use it saying it makes his balance worse.   Pt currently with functional limitations due to tbalance and endurance deficits. Pt will benefit from skilled PT to increase their independence and safety with mobility to allow discharge to the venue listed below.      Recommendations for follow up therapy are one component of a multi-disciplinary discharge planning process, led by the attending physician.  Recommendations may be updated based on patient status, additional functional criteria and insurance authorization.  Follow Up Recommendations  Home health PT     Assistance Recommended at Discharge Intermittent Supervision/Assistance  Equipment Recommendations   (PT recommends use of RW however pt likely to not use based on personal preference)    Recommendations for Other Services       Precautions / Restrictions Precautions Precautions: Fall Restrictions Weight Bearing Restrictions: No     Mobility  Bed Mobility Overal bed mobility: Needs Assistance Bed Mobility: Supine to Sit     Supine to sit: Supervision;HOB elevated          Transfers Overall transfer level: Needs assistance Equipment used: Straight cane Transfers: Sit to/from Stand Sit to Stand: Supervision                Ambulation/Gait Ambulation/Gait assistance: Supervision;Min guard Gait Distance (Feet): 150 Feet Assistive device: Straight  cane Gait Pattern/deviations: Step-to pattern;Wide base of support Gait velocity: reduced Gait velocity interpretation: <1.8 ft/sec, indicate of risk for recurrent falls   General Gait Details: pt with short waddling steps, reduced foot clearance and step length.  did not lose balance however gait appears unsteady at times.   Stairs             Wheelchair Mobility    Modified Rankin (Stroke Patients Only)       Balance Overall balance assessment: Needs assistance Sitting-balance support: No upper extremity supported;Feet supported Sitting balance-Leahy Scale: Good     Standing balance support: Single extremity supported Standing balance-Leahy Scale: Poor Standing balance comment: reliant on UE support of cane or sink.  Pt able to wash hands after using bathrom and brushed his teeth and combed his hair.                            Cognition Arousal/Alertness: Awake/alert Behavior During Therapy: WFL for tasks assessed/performed Overall Cognitive Status: No family/caregiver present to determine baseline cognitive functioning                                 General Comments: likely baseline, follows commands        Exercises      General Comments General comments (skin integrity, edema, etc.): VSS on RA      Pertinent Vitals/Pain Pain Assessment: No/denies pain    Home Living  Prior Function            PT Goals (current goals can now be found in the care plan section) Acute Rehab PT Goals Patient Stated Goal: to go home Progress towards PT goals: Progressing toward goals    Frequency    Min 3X/week      PT Plan Current plan remains appropriate    Co-evaluation              AM-PAC PT "6 Clicks" Mobility   Outcome Measure  Help needed turning from your back to your side while in a flat bed without using bedrails?: A Little Help needed moving from lying on your back to sitting  on the side of a flat bed without using bedrails?: A Little Help needed moving to and from a bed to a chair (including a wheelchair)?: A Little Help needed standing up from a chair using your arms (e.g., wheelchair or bedside chair)?: A Little Help needed to walk in hospital room?: A Little Help needed climbing 3-5 steps with a railing? : A Lot 6 Click Score: 17    End of Session Equipment Utilized During Treatment: Gait belt Activity Tolerance: Patient tolerated treatment well Patient left: in chair;with call bell/phone within reach;with chair alarm set Nurse Communication: Mobility status PT Visit Diagnosis: Other abnormalities of gait and mobility (R26.89)     Time: 8469-6295 PT Time Calculation (min) (ACUTE ONLY): 28 min  Charges:  $Gait Training: 23-37 mins                     Panda Crossin M,PT Acute Rehab Services [phone] (pager)    Bevelyn Buckles 11/06/2021, 2:10 PM

## 2021-11-06 NOTE — Discharge Summary (Signed)
Physician Discharge Summary  [name] YWV:[id number] DOB: 10-15-1941 DOA: 11/03/2021  PCP: Hayden Rasmussen, MD  Admit date: 11/03/2021 Discharge date: 11/06/2021  Admitted From: Home Disposition:  Home  Recommendations for Outpatient Follow-up:  Follow up with PCP in 1-2 weeks, remove staples. Please obtain BMP/CBC in one week Will need outpatient CT follow-up of nodule of the renal cyst.   Home Health:Yes Equipment/Devices:None  Discharge Condition:Stable CODE STATUS:Full Diet recommendation: Heart Healthy /   Brief/Interim Summary: 80 y.o. male past medical history significant for permanent atrial fibrillation on Eliquis, CVA, history of GI bleed, chronic diastolic heart failure who was been dealing with neck pain for quite a while he has had a thoracic spine spinal fusion in the past when he turned his head and fell and hit the dresser.  Was found hypotensive in the ED which responded to fluid resuscitation trauma was consulted just recommended observation with a CT of the head cervical spine and abdomen which did not reveal any trauma.  Initially his hemoglobin was 14.7 repeated 1 after fluid resuscitation was 13  Discharge Diagnoses:  Principal Problem:   Fall Active Problems:   AF (paroxysmal atrial fibrillation) (HCC)   AKI (acute kidney injury) ([place])   Hypotension   Laceration of forehead   Chronic diastolic CHF (congestive heart failure) (HCC)   Renal cyst   Hyperkalemia  Hypotension following a mechanical fall with bleeding laceration of the scalp: Patient was on Eliquis at home this was held laceration was repaired in the ED. Trauma was consulted recommended a CT of the head and C-spine that showed no acute intracranial findings. Eliquis resumed. His blood pressure stabilized. Physical therapy evaluated the patient and recommended home health PT.  Right anterior forehead laceration: Will need to follow-up with PCP to remove staples. Resume  Eliquis.  Acute kidney injury: Likely prerenal azotemia resolved with IV fluid hydration.  Hypokalemia: Likely due to hypotension and hypovolemia resolved with IV fluid hydration.  Acute blood loss anemia/normocytic anemia in the setting of Eliquis likely due to a laceration. His hemoglobin after this remained stable 2 mg as an outpatient no signs of overt bleeding from the cut.  Chronic diastolic heart failure: Appears euvolemic no changes made to his medication.  Incidental renal cyst: Noted on CT scan going to follow-up with PCP as an outpatient.  Sacral decubitus ulcer stage III present on admission: Continue wound care recommendations physical therapy to follow-up at home.  Discharge Instructions  Discharge Instructions     Diet - low sodium heart healthy   Complete by: As directed    Increase activity slowly   Complete by: As directed    No wound care   Complete by: As directed       Allergies as of 11/06/2021       Reactions   Lorazepam Other (See Comments)   Delirium   Duloxetine Other (See Comments)   Increased depression, irritability   Pregabalin Swelling, Other (See Comments)   Flashbacks, Bad dreams, Aggressive behavior, swelling   Zanaflex [tizanidine Hcl] Anxiety        Medication List     TAKE these medications    apixaban 5 MG Tabs tablet Commonly known as: Eliquis Take 1 tablet (5 mg total) by mouth 2 (two) times daily.   ascorbic acid 250 MG tablet Commonly known as: VITAMIN C Take 1 tablet by mouth 2 (two) times daily.   aspirin EC 81 MG tablet Take 81 mg by mouth daily.   buPROPion  100 MG tablet Commonly known as: WELLBUTRIN Take 100 mg by mouth 2 (two) times daily.   cyanocobalamin 1000 MCG/ML injection Commonly known as: (VITAMIN B-12) Inject 1 mL into the muscle every 28 (twenty-eight) days.   diclofenac Sodium 1 % Gel Commonly known as: VOLTAREN Apply 2 g topically 4 (four) times daily.   diltiazem 120 MG 24 hr  capsule Commonly known as: CARDIZEM CD Take 1 capsule (120 mg total) by mouth daily.   ferrous sulfate 325 (65 FE) MG tablet Take 325 mg by mouth daily.   fluticasone 50 MCG/ACT nasal spray Commonly known as: FLONASE Place 1 spray into both nostrils daily as needed for allergies or rhinitis.   furosemide 20 MG tablet Commonly known as: LASIX Take 20 mg by mouth daily.   furosemide 80 MG tablet Commonly known as: LASIX Take 1 tablet (80 mg total) by mouth 2 (two) times daily.   gabapentin 600 MG tablet Commonly known as: NEURONTIN Take 600 mg by mouth 3 (three) times daily.   methocarbamol 500 MG tablet Commonly known as: ROBAXIN Take 500 mg by mouth 2 (two) times daily.   metoprolol succinate 25 MG 24 hr tablet Commonly known as: TOPROL-XL Take 25 mg by mouth daily.   Metoprolol Tartrate 75 MG Tabs Take 75 mg by mouth 2 (two) times daily.   pantoprazole 40 MG tablet Commonly known as: Protonix Take 1 tablet (40 mg total) by mouth 2 (two) times daily.   polyethylene glycol 17 g packet Commonly known as: MIRALAX / GLYCOLAX Take 17 g by mouth daily.   potassium chloride SA 20 MEQ tablet Commonly known as: KLOR-CON Take 1 tablet (20 mEq total) by mouth daily. What changed: when to take this   rOPINIRole 0.25 MG tablet Commonly known as: REQUIP Take 0.25 mg by mouth 2 (two) times daily.   simvastatin 40 MG tablet Commonly known as: ZOCOR Take 40 mg by mouth at bedtime.   spironolactone 25 MG tablet Commonly known as: ALDACTONE Take 25 mg by mouth daily.   tamsulosin 0.4 MG Caps capsule Commonly known as: FLOMAX Take 0.4 mg by mouth daily.        Allergies  Allergen Reactions   Lorazepam Other (See Comments)    Delirium    Duloxetine Other (See Comments)    Increased depression, irritability   Pregabalin Swelling and Other (See Comments)    Flashbacks, Bad dreams, Aggressive behavior, swelling   Zanaflex [Tizanidine Hcl] Anxiety     Consultations: Trauma surgery   Procedures/Studies: CT HEAD WO CONTRAST (5MM)  Result Date: 11/03/2021 CLINICAL DATA:  80 year old male with history of head trauma after falling out of bed. EXAM: CT HEAD WITHOUT CONTRAST CT CERVICAL SPINE WITHOUT CONTRAST TECHNIQUE: Multidetector CT imaging of the head and cervical spine was performed following the standard protocol without intravenous contrast. Multiplanar CT image reconstructions of the cervical spine were also generated. COMPARISON:  Cervical spine CT scan 12/31/2019. Brain MRI 11/11/2019. Head CT 11/10/2019. Moderate cerebral atrophy. FINDINGS: CT HEAD FINDINGS Brain: Moderate cerebral atrophy. Patchy and confluent areas of decreased attenuation are noted throughout the deep and periventricular white matter of the cerebral hemispheres bilaterally, compatible with chronic microvascular ischemic disease. Chronic encephalomalacia in the inferomedial aspect of the right cerebellar hemisphere, similar to prior examinations. No evidence of acute infarction, hemorrhage, hydrocephalus, extra-axial collection or mass lesion/mass effect. Vascular: No hyperdense vessel or unexpected calcification. Skull: Orthopedic fixation hardware in the occipital bone. No acute displaced skull fractures. Sinuses/Orbits: No acute finding.  Other: Extensive soft tissue swelling and gas in the soft tissues of the right frontal scalp, presumably from scalp contusion/laceration. CT CERVICAL SPINE FINDINGS Comment: Today's study is limited by considerable artifact from indwelling spinal hardware. Alignment: 2 mm of anterolisthesis of C2 upon C3 and 5 mm of anterolisthesis of C6 upon C7 both appear to be chronic. Alignment is otherwise anatomic. Skull base and vertebrae: Rod and screw fixation hardware extending from the occipital bone to the level of C7 (occipital bone to C3 and C4-C7). Status post ACDF at C6-C7. Complete bony fusion of the facet joints throughout the cervical  spine. Complete bony fusion of C6-C7. No acute displaced fractures are identified. Soft tissues and spinal canal: No prevertebral fluid or swelling. No visible canal hematoma. Disc levels: Severe multilevel degenerative disc disease throughout the cervical spine and visualized upper thoracic spine, with complete bony fusion at C6-C7. Complete bony fusion of the facet joints throughout the cervical spine. Upper chest: Unremarkable. Other: None. IMPRESSION: 1. No evidence of significant acute traumatic injury to the skull, brain or cervical spine. 2. Large right frontal scalp contusion/laceration. 3. Moderate cerebral atrophy with chronic microvascular ischemic changes in cerebral white matter and chronic encephalomalacia in the right cerebellar hemisphere, similar to prior examinations, as above. 4. Postoperative changes and chronic degenerative findings in the cervical spine, as above. Electronically Signed   By: Vinnie Langton M.D.   On: 11/03/2021 16:18   CT Soft Tissue Neck W Contrast  Result Date: 10/19/2021 CLINICAL DATA:  Right-sided neck pain EXAM: CT NECK WITH CONTRAST TECHNIQUE: Multidetector CT imaging of the neck was performed using the standard protocol following the bolus administration of intravenous contrast. CONTRAST:  10mL OMNIPAQUE IOHEXOL 350 MG/ML SOLN COMPARISON:  None. FINDINGS: PHARYNX AND LARYNX: The nasopharynx, oropharynx and larynx are normal. Visible portions of the oral cavity, tongue base and floor of mouth are normal. Normal epiglottis, vallecula and pyriform sinuses. The larynx is normal. No retropharyngeal abscess, effusion or lymphadenopathy. SALIVARY GLANDS: Normal parotid, submandibular and sublingual glands. THYROID: Normal. LYMPH NODES: No enlarged or abnormal density lymph nodes. VASCULAR: Mild aortic and carotid bifurcation atherosclerosis LIMITED INTRACRANIAL: Normal VISUALIZED ORBITS: Normal MASTOIDS AND VISUALIZED PARANASAL SINUSES: No fluid levels or advanced mucosal  thickening. No mastoid effusion. SKELETON: There is posterior cervical fusion hardware from the occiput put to T1. There is C6-7 ACDF. UPPER CHEST: Clear. OTHER: None. IMPRESSION: 1. No acute abnormality of the neck. 2. Long segment posterior cervical arthrodesis. No adverse features. Aortic Atherosclerosis (ICD10-I70.0). Electronically Signed   By: Ulyses Jarred M.D.   On: 10/19/2021 21:39   CT Cervical Spine Wo Contrast  Result Date: 11/03/2021 CLINICAL DATA:  80 year old male with history of head trauma after falling out of bed. EXAM: CT HEAD WITHOUT CONTRAST CT CERVICAL SPINE WITHOUT CONTRAST TECHNIQUE: Multidetector CT imaging of the head and cervical spine was performed following the standard protocol without intravenous contrast. Multiplanar CT image reconstructions of the cervical spine were also generated. COMPARISON:  Cervical spine CT scan 12/31/2019. Brain MRI 11/11/2019. Head CT 11/10/2019. Moderate cerebral atrophy. FINDINGS: CT HEAD FINDINGS Brain: Moderate cerebral atrophy. Patchy and confluent areas of decreased attenuation are noted throughout the deep and periventricular white matter of the cerebral hemispheres bilaterally, compatible with chronic microvascular ischemic disease. Chronic encephalomalacia in the inferomedial aspect of the right cerebellar hemisphere, similar to prior examinations. No evidence of acute infarction, hemorrhage, hydrocephalus, extra-axial collection or mass lesion/mass effect. Vascular: No hyperdense vessel or unexpected calcification. Skull: Orthopedic fixation hardware in  the occipital bone. No acute displaced skull fractures. Sinuses/Orbits: No acute finding. Other: Extensive soft tissue swelling and gas in the soft tissues of the right frontal scalp, presumably from scalp contusion/laceration. CT CERVICAL SPINE FINDINGS Comment: Today's study is limited by considerable artifact from indwelling spinal hardware. Alignment: 2 mm of anterolisthesis of C2 upon C3  and 5 mm of anterolisthesis of C6 upon C7 both appear to be chronic. Alignment is otherwise anatomic. Skull base and vertebrae: Rod and screw fixation hardware extending from the occipital bone to the level of C7 (occipital bone to C3 and C4-C7). Status post ACDF at C6-C7. Complete bony fusion of the facet joints throughout the cervical spine. Complete bony fusion of C6-C7. No acute displaced fractures are identified. Soft tissues and spinal canal: No prevertebral fluid or swelling. No visible canal hematoma. Disc levels: Severe multilevel degenerative disc disease throughout the cervical spine and visualized upper thoracic spine, with complete bony fusion at C6-C7. Complete bony fusion of the facet joints throughout the cervical spine. Upper chest: Unremarkable. Other: None. IMPRESSION: 1. No evidence of significant acute traumatic injury to the skull, brain or cervical spine. 2. Large right frontal scalp contusion/laceration. 3. Moderate cerebral atrophy with chronic microvascular ischemic changes in cerebral white matter and chronic encephalomalacia in the right cerebellar hemisphere, similar to prior examinations, as above. 4. Postoperative changes and chronic degenerative findings in the cervical spine, as above. Electronically Signed   By: Vinnie Langton M.D.   On: 11/03/2021 16:18   DG Pelvis Portable  Result Date: 11/03/2021 CLINICAL DATA:  Status post fall. EXAM: PORTABLE PELVIS 1-2 VIEWS COMPARISON:  None. FINDINGS: There is no evidence of pelvic fracture or diastasis. No pelvic bone lesions are seen. IMPRESSION: Negative. Electronically Signed   By: Virgina Norfolk M.D.   On: 11/03/2021 15:53   CT CHEST ABDOMEN PELVIS W CONTRAST  Result Date: 11/03/2021 CLINICAL DATA:  Abdominal trauma.  Status post fall EXAM: CT CHEST, ABDOMEN, AND PELVIS WITH CONTRAST TECHNIQUE: Multidetector CT imaging of the chest, abdomen and pelvis was performed following the standard protocol during bolus  administration of intravenous contrast. CONTRAST:  178mL OMNIPAQUE IOHEXOL 350 MG/ML SOLN COMPARISON:  None. FINDINGS: CHEST: Ports and Devices: None. Lungs/airways: No focal consolidation. Punctate calcified pulmonary micronodules. No pulmonary mass. No pulmonary contusion or laceration. No pneumatocele formation. The central airways are patent. Mild diffuse bronchial wall thickening. Pleura: No pleural effusion. No pneumothorax. No hemothorax. Lymph Nodes: No mediastinal, hilar, or axillary lymphadenopathy. Mediastinum: No pneumomediastinum. No aortic injury or mediastinal hematoma. The thoracic aorta is normal in caliber. Status post aortic valve replacement. Atherosclerotic plaque. Enlarged left atrium. No significant pericardial effusion. The main pulmonary artery is normal in caliber. No central pulmonary embolus. The esophagus is unremarkable. The thyroid is unremarkable. Chest Wall / Breasts: No chest wall mass.  Bilateral gynecomastia. Musculoskeletal: No acute rib or sternal fracture. Old healed left rib fractures. No spinal fracture. Multilevel severe degenerative changes of the spine. Lower cervical spine surgical hardware-please see separately dictated CT cervical spine 11/03/2021. T10-T11 posterolateral fusion surgical hardware. ABDOMEN / PELVIS: Liver: Not enlarged. No focal lesion. No laceration or subcapsular hematoma. Biliary System: The gallbladder is otherwise unremarkable with no radio-opaque gallstones. No biliary ductal dilatation. Pancreas: Normal pancreatic contour. No main pancreatic duct dilatation. Spleen: Not enlarged. No focal lesion. No laceration, subcapsular hematoma, or vascular injury. Adrenal Glands: No nodularity bilaterally. Kidneys: Bilateral kidneys enhance symmetrically. No hydronephrosis. No contusion, laceration, or subcapsular hematoma. Punctate calcified stone within the right  kidney. There is a 2.4 cm fluid density lesion within the right kidney that likely represents a  simple renal cyst. Pericentimeter fluid density lesion within left kidney likely represents a simple renal cyst. Indeterminate slightly more conspicuous 1 cm left renal cyst with a density of 30 Hounsfield units. No injury to the vascular structures or collecting systems. No hydroureter. The urinary bladder is decompressed with circumferential urinary bladder wall thickening likely due to under distension. Urinary bladder diverticula noted. Bowel: Gastric surgical changes. No small or large bowel wall thickening or dilatation. The appendix is unremarkable. Mesentery, Omentum, and Peritoneum: No simple free fluid ascites. No pneumoperitoneum. No hemoperitoneum. No mesenteric hematoma identified. No organized fluid collection. Pelvic Organs: Normal. Lymph Nodes: No abdominal, pelvic, inguinal lymphadenopathy. Vasculature: Atherosclerotic plaque. No abdominal aorta or iliac aneurysm. No active contrast extravasation or pseudoaneurysm. Musculoskeletal: No significant soft tissue hematoma. Small fat containing supraumbilical ventral hernias with an abdominal defect of 2 cm (7:63) as well as 1.1 cm (7:71). No acute pelvic fracture. No spinal fracture. Multilevel severe degenerative changes of the spine. IMPRESSION: 1. No acute traumatic injury to the chest, abdomen, or pelvis. 2. No acute fracture or traumatic malalignment of the thoracic or lumbar spine. Other imaging findings of potential clinical significance: 1. Indeterminate slightly more conspicuous 1 cm left renal cyst with a density of 30 Hounsfield units. Recommend MRI renal protocol for further evaluation. When the patient is clinically stable and able to follow directions and hold their breath (preferably as an outpatient) further evaluation with dedicated abdominal MRI should be considered. 2. Nonobstructive punctate right nephrolithiasis. 3. Fat containing supraumbilical ventral hernias. 4. The circumferential urinary bladder wall thickening likely due to  under distension. Urinary bladder diverticula noted. 5. Left atrial enlargement. Electronically Signed   By: Iven Finn M.D.   On: 11/03/2021 20:59   DG Chest Portable 1 View  Result Date: 11/03/2021 CLINICAL DATA:  Status post fall. EXAM: PORTABLE CHEST 1 VIEW COMPARISON:  None. FINDINGS: The heart size and mediastinal contours are within normal limits. An artificial aortic valve is seen. Both lungs are clear. Postoperative changes are noted within the cervical spine and lower thoracic spine. Degenerative changes are also seen involving the thoracic spine and bilateral shoulders. IMPRESSION: No active disease. Electronically Signed   By: Virgina Norfolk M.D.   On: 11/03/2021 15:52   DG Hip Unilat W or Wo Pelvis 2-3 Views Right  Result Date: 11/03/2021 CLINICAL DATA:  Fall. EXAM: DG HIP (WITH OR WITHOUT PELVIS) 2-3V RIGHT COMPARISON:  None. FINDINGS: There is no acute fracture or dislocation. There are mild degenerative changes of the right hip. There are vascular calcifications in the soft tissues. IMPRESSION: No acute bony abnormality. Electronically Signed   By: Ronney Asters M.D.   On: 11/03/2021 16:25   (Echo, Carotid, EGD, Colonoscopy, ERCP)    Subjective: No complaints wants to go home.  Discharge Exam: Vitals:   11/06/21 0019 11/06/21 0525  BP: (!) 109/56 (!) 94/59  Pulse: 80 61  Resp: 17 18  Temp: 97.9 F (36.6 C) 97.8 F (36.6 C)  SpO2: 99% 98%   Vitals:   11/05/21 2038 11/05/21 2337 11/06/21 0019 11/06/21 0525  BP:  (!) 110/57 (!) 109/56 (!) 94/59  Pulse: 77 77 80 61  Resp:  14 17 18   Temp:  98.2 F (36.8 C) 97.9 F (36.6 C) 97.8 F (36.6 C)  TempSrc:  Oral Oral   SpO2:  99% 99% 98%  Weight:  Height:        General: Pt is alert, awake, not in acute distress Cardiovascular: RRR, S1/S2 +, no rubs, no gallops Respiratory: CTA bilaterally, no wheezing, no rhonchi Abdominal: Soft, NT, ND, bowel sounds + Extremities: no edema, no cyanosis    The  results of significant diagnostics from this hospitalization (including imaging, microbiology, ancillary and laboratory) are listed below for reference.     Microbiology: Recent Results (from the past 240 hour(s))  Resp Panel by RT-PCR (Flu A&B, Covid) Nasopharyngeal Swab     Status: None   Collection Time: 11/03/21 11:55 PM   Specimen: Nasopharyngeal Swab; Nasopharyngeal(NP) swabs in vial transport medium  Result Value Ref Range Status   SARS Coronavirus 2 by RT PCR NEGATIVE NEGATIVE Final    Comment: (NOTE) SARS-CoV-2 target nucleic acids are NOT DETECTED.  The SARS-CoV-2 RNA is generally detectable in upper respiratory specimens during the acute phase of infection. The lowest concentration of SARS-CoV-2 viral copies this assay can detect is 138 copies/mL. A negative result does not preclude SARS-Cov-2 infection and should not be used as the sole basis for treatment or other patient management decisions. A negative result may occur with  improper specimen collection/handling, submission of specimen other than nasopharyngeal swab, presence of viral mutation(s) within the areas targeted by this assay, and inadequate number of viral copies(<138 copies/mL). A negative result must be combined with clinical observations, patient history, and epidemiological information. The expected result is Negative.  Fact Sheet for Patients:  EntrepreneurPulse.com.au  Fact Sheet for Healthcare Providers:  IncredibleEmployment.be  This test is no t yet approved or cleared by the [place] FDA and  has been authorized for detection and/or diagnosis of SARS-CoV-2 by FDA under an Emergency Use Authorization (EUA). This EUA will remain  in effect (meaning this test can be used) for the duration of the COVID-19 declaration under Section 564(b)(1) of the Act, 21 U.S.C.section 360bbb-3(b)(1), unless the authorization is terminated  or revoked sooner.        Influenza A by PCR NEGATIVE NEGATIVE Final   Influenza B by PCR NEGATIVE NEGATIVE Final    Comment: (NOTE) The Xpert Xpress SARS-CoV-2/FLU/RSV plus assay is intended as an aid in the diagnosis of influenza from Nasopharyngeal swab specimens and should not be used as a sole basis for treatment. Nasal washings and aspirates are unacceptable for Xpert Xpress SARS-CoV-2/FLU/RSV testing.  Fact Sheet for Patients: EntrepreneurPulse.com.au  Fact Sheet for Healthcare Providers: IncredibleEmployment.be  This test is not yet approved or cleared by the [place] FDA and has been authorized for detection and/or diagnosis of SARS-CoV-2 by FDA under an Emergency Use Authorization (EUA). This EUA will remain in effect (meaning this test can be used) for the duration of the COVID-19 declaration under Section 564(b)(1) of the Act, 21 U.S.C. section 360bbb-3(b)(1), unless the authorization is terminated or revoked.  Performed at [facility], [place] [address], [place], [place] [address]      Labs: BNP (last 3 results) No results for input(s): BNP in the last 8760 hours. Basic Metabolic Panel: Recent Labs  Lab 11/03/21 1548 11/04/21 0350  NA 139 137  K 5.2* 3.9  CL 103 107  CO2  --  26  GLUCOSE 101* 121*  BUN 26* 19  CREATININE 1.30* 1.10  CALCIUM  --  8.6*   Liver Function Tests: No results for input(s): AST, ALT, ALKPHOS, BILITOT, PROT, ALBUMIN in the last 168 hours. No results for input(s): LIPASE, AMYLASE in the last 168  hours. No results for input(s): AMMONIA in the last 168 hours. CBC: Recent Labs  Lab 11/03/21 1535 11/03/21 1548 11/03/21 1940 11/04/21 0000 11/04/21 0350  WBC 8.4  --  8.7  --  8.4  HGB 14.7 16.0 12.9* 11.7* 11.8*  HCT 44.3 47.0 39.6 36.5* 36.3*  MCV 95.5  --  95.9  --  96.5  PLT 151  --  192  --  171   Cardiac Enzymes: Recent Labs  Lab 11/04/21 0000  CKTOTAL 71   BNP: Invalid input(s):  POCBNP CBG: No results for input(s): GLUCAP in the last 168 hours. D-Dimer No results for input(s): DDIMER in the last 72 hours. Hgb A1c No results for input(s): HGBA1C in the last 72 hours. Lipid Profile No results for input(s): CHOL, HDL, LDLCALC, TRIG, CHOLHDL, LDLDIRECT in the last 72 hours. Thyroid function studies No results for input(s): TSH, T4TOTAL, T3FREE, THYROIDAB in the last 72 hours.  Invalid input(s): FREET3 Anemia work up No results for input(s): VITAMINB12, FOLATE, FERRITIN, TIBC, IRON, RETICCTPCT in the last 72 hours. Urinalysis    Component Value Date/Time   COLORURINE STRAW (A) 11/10/2019 0303   APPEARANCEUR HAZY (A) 11/10/2019 0303   LABSPEC 1.008 11/10/2019 0303   PHURINE 5.0 11/10/2019 0303   GLUCOSEU NEGATIVE 11/10/2019 0303   HGBUR SMALL (A) 11/10/2019 0303   HGBUR negative 04/08/2007 1327   BILIRUBINUR NEGATIVE 11/10/2019 0303   KETONESUR NEGATIVE 11/10/2019 0303   PROTEINUR NEGATIVE 11/10/2019 0303   UROBILINOGEN 0.2 04/24/2010 1617   NITRITE NEGATIVE 11/10/2019 0303   LEUKOCYTESUR LARGE (A) 11/10/2019 0303   Sepsis Labs Invalid input(s): PROCALCITONIN,  WBC,  [facility] Microbiology Recent Results (from the past 240 hour(s))  Resp Panel by RT-PCR (Flu A&B, Covid) Nasopharyngeal Swab     Status: None   Collection Time: 11/03/21 11:55 PM   Specimen: Nasopharyngeal Swab; Nasopharyngeal(NP) swabs in vial transport medium  Result Value Ref Range Status   SARS Coronavirus 2 by RT PCR NEGATIVE NEGATIVE Final    Comment: (NOTE) SARS-CoV-2 target nucleic acids are NOT DETECTED.  The SARS-CoV-2 RNA is generally detectable in upper respiratory specimens during the acute phase of infection. The lowest concentration of SARS-CoV-2 viral copies this assay can detect is 138 copies/mL. A negative result does not preclude SARS-Cov-2 infection and should not be used as the sole basis for treatment or other patient management decisions. A negative result may  occur with  improper specimen collection/handling, submission of specimen other than nasopharyngeal swab, presence of viral mutation(s) within the areas targeted by this assay, and inadequate number of viral copies(<138 copies/mL). A negative result must be combined with clinical observations, patient history, and epidemiological information. The expected result is Negative.  Fact Sheet for Patients:  EntrepreneurPulse.com.au  Fact Sheet for Healthcare Providers:  IncredibleEmployment.be  This test is no t yet approved or cleared by the [place] FDA and  has been authorized for detection and/or diagnosis of SARS-CoV-2 by FDA under an Emergency Use Authorization (EUA). This EUA will remain  in effect (meaning this test can be used) for the duration of the COVID-19 declaration under Section 564(b)(1) of the Act, 21 U.S.C.section 360bbb-3(b)(1), unless the authorization is terminated  or revoked sooner.       Influenza A by PCR NEGATIVE NEGATIVE Final   Influenza B by PCR NEGATIVE NEGATIVE Final    Comment: (NOTE) The Xpert Xpress SARS-CoV-2/FLU/RSV plus assay is intended as an aid in the diagnosis of influenza from Nasopharyngeal swab specimens and should not  be used as a sole basis for treatment. Nasal washings and aspirates are unacceptable for Xpert Xpress SARS-CoV-2/FLU/RSV testing.  Fact Sheet for Patients: EntrepreneurPulse.com.au  Fact Sheet for Healthcare Providers: IncredibleEmployment.be  This test is not yet approved or cleared by the [place] FDA and has been authorized for detection and/or diagnosis of SARS-CoV-2 by FDA under an Emergency Use Authorization (EUA). This EUA will remain in effect (meaning this test can be used) for the duration of the COVID-19 declaration under Section 564(b)(1) of the Act, 21 U.S.C. section 360bbb-3(b)(1), unless the authorization is terminated  or revoked.  Performed at [facility], [place] [address], [place], [place] [address]     SIGNED:   Charlynne Cousins, MD  [facility] 11/06/2021, 8:48 AM Pager   If 7PM-7AM, please contact night-coverage [email or web address] Password [email or web address]

## 2021-11-06 NOTE — Progress Notes (Signed)
Pt due for morning dose of metoprolol 12.5 mg. Last bp 96/60 with MAP 72, pulse 63. Dr. Olevia Bowens paged, advised to hold morning dose of metoprolol.

## 2021-11-06 NOTE — TOC Initial Note (Signed)
Transition of Care [facility]) - Initial/Assessment Note    Patient Details  Name: [name] MRN: [id number] Date of Birth: 1941/10/23  [facility] of [facility]) CM/SW Contact:    Marilu Favre, RN Phone Number: 11/06/2021, 9:00 AM  Clinical Narrative:                 PAtient from home with sister. Confirmed face sheet information.   PAtient has a cane, refusing walker. Patient agreeable to home health. Referral accepted by Malachy Mood with [facility] . Cheryl requested [facility] to be added. Secure chatted MD.  Expected Discharge Plan: [place] Barriers to Discharge: No Barriers Identified   Patient Goals and CMS Choice Patient states their goals for this hospitalization and ongoing recovery are:: to go home CMS Medicare.gov Compare Post Acute Care list provided to:: Patient Choice offered to / list presented to : Patient  Expected Discharge Plan and Services Expected Discharge Plan: [place]   Discharge Planning Services: CM Consult Post Acute Care Choice: [place] arrangements for the past 2 months: Single Family Home Expected Discharge Date: 11/06/21                 DME Agency:  (patient refusing walker)       HH Arranged: OT, PT, RN [place] Agency: [place] Date [place]: 11/06/21 Time [place]: [phone] Representative spoke with at [place]: CHeryl  Prior Living Arrangements/Services Living arrangements for the past 2 months: [place] with:: Siblings Patient language and need for interpreter reviewed:: Yes Do you feel safe going back to the place where you live?: Yes      Need for Family Participation in Patient Care: Yes (Comment) Care giver support system in place?: Yes (comment) Current home services: DME Criminal Activity/Legal Involvement Pertinent to Current Situation/Hospitalization: No - Comment as needed  Activities of Daily Living Home Assistive  Devices/Equipment: Cane (specify quad or straight) ADL Screening (condition at time of admission) Patient's cognitive ability adequate to safely complete daily activities?: Yes Is the patient deaf or have difficulty hearing?: No Does the patient have difficulty seeing, even when wearing glasses/contacts?: Yes Does the patient have difficulty concentrating, remembering, or making decisions?: No Patient able to express need for assistance with ADLs?: Yes Does the patient have difficulty dressing or bathing?: Yes Independently performs ADLs?: Yes (appropriate for developmental age) Does the patient have difficulty walking or climbing stairs?: Yes Weakness of Legs: Both Weakness of Arms/Hands: Both  Permission Sought/Granted   Permission granted to share information with : No              Emotional Assessment Appearance:: Appears stated age Attitude/Demeanor/Rapport: Engaged Affect (typically observed): Accepting Orientation: : Oriented to Self, Oriented to Place, Oriented to  Time, Oriented to Situation Alcohol / Substance Use: Not Applicable Psych Involvement: No (comment)  Admission diagnosis:  Fall [W19.XXXA] Patient Active Problem List   Diagnosis Date Noted   Hypotension 11/04/2021   Laceration of forehead 11/04/2021   Chronic diastolic CHF (congestive heart failure) ([place]) 11/04/2021   Renal cyst 11/04/2021   Hyperkalemia 11/04/2021   Fall 11/03/2021   Palliative care by specialist    Goals of care, counseling/discussion    UTI (urinary tract infection) 11/10/2019   AF (paroxysmal atrial fibrillation) ([place]) 11/10/2019   Acute blood loss anemia 11/10/2019   Cellulitis 11/10/2019   AKI (acute kidney injury) ([place]) 11/10/2019   History of CVA (cerebrovascular accident)  11/10/2019   Abdominal pain 10/07/2019   Pressure injury of skin 10/07/2019   Atrial fibrillation with rapid ventricular response ([place] [address]) 10/06/2019   Acute encephalopathy    GI bleeding 76/22/6333    Acute metabolic encephalopathy 54/56/2563   Acute renal failure (ARF) ([place]) 10/13/2018   S/P TAVR (transcatheter aortic valve replacement) 09/20/2018   Permanent atrial fibrillation ([place]) 09/20/2018   Acute on chronic diastolic CHF (congestive heart failure) ([place]) 09/18/2018   Heme positive stool 11/20/2017   Gait disorder 03/24/2013   Cervical post-laminectomy syndrome 03/26/2012   Depression 03/26/2012   Degenerative arthritis of shoulder region 03/26/2012   KNEE REPLACEMENT, BILATERAL, HX OF 06/04/2007   CELLULITIS/ABSCESS, ARM 05/05/2007   Anxiety state 04/08/2007   PAIN, CHRONIC NEC 04/08/2007   OSTEOARTHROSIS, GENERALIZED, MULTIPLE SITES 04/08/2007   SPONDYLOLISTHESIS 04/08/2007   INCONTINENCE, URGE 04/08/2007   HYPERCHOLESTEROLEMIA 03/07/2007   HYPERTENSION, BENIGN 03/07/2007   PCP:  Hayden Rasmussen, MD Pharmacy:   [id number] [address], [place] [address] Phone: [phone] Fax: [phone]     Social Determinants of Health (SDOH) Interventions    Readmission Risk Interventions No flowsheet data found.

## 2021-11-06 NOTE — Progress Notes (Signed)
11/06/2021 0000 Pt transferred to 4N-24.  Report given to nurse prior to transfer and any other questions answered at bedside on transfer.   [place]

## 2021-11-06 NOTE — TOC CAGE-AID Note (Signed)
Transition of Care [facility]) - CAGE-AID Screening   Patient Details  Name: [name] MRN: [id number] Date of Birth: [birth year]  [facility]) CM/SW Contact:    Averi Cacioppo C Tarpley-Carter, LCSWA Phone Number: 11/06/2021, 12:48 PM   Clinical Narrative: Pt participated in [place].  Pt stated he does not use substance or ETOH.  Pt was not offered resources, due to no usage of substance or ETOH.     Olyn Landstrom Tarpley-Carter, MSW, LCSW-A Pronouns:  She/Her/Hers [facility]itions [facility] Care Clinical Social Worker Direct Number:  [phone] [email or web address]    CAGE-AID Screening:    Have You Ever Felt You Ought to Cut Down on Your Drinking or Drug Use?: No Have People Annoyed You By [employer] Your Drinking Or Drug Use?: No Have You Felt Bad Or Guilty About Your Drinking Or Drug Use?: No Have You Ever Had a Drink or Used Drugs First Thing In The Morning to Steady Your Nerves or to Get Rid of a Hangover?: No CAGE-AID Score: 0  Substance Abuse Education Offered: No

## 2021-12-15 ENCOUNTER — Other Ambulatory Visit: Payer: Self-pay | Admitting: Neurosurgery

## 2021-12-15 DIAGNOSIS — G959 Disease of spinal cord, unspecified: Secondary | ICD-10-CM

## 2021-12-20 ENCOUNTER — Other Ambulatory Visit: Payer: Self-pay

## 2021-12-20 ENCOUNTER — Ambulatory Visit
Admission: RE | Admit: 2021-12-20 | Discharge: 2021-12-20 | Disposition: A | Discharge status: Home / Self Care | Payer: Medicare Other | Source: Ambulatory Visit | Attending: Neurosurgery | Admitting: Neurosurgery

## 2021-12-20 DIAGNOSIS — G959 Disease of spinal cord, unspecified: Secondary | ICD-10-CM

## 2021-12-20 MED ORDER — ONDANSETRON HCL 4 MG/2ML IJ SOLN: 4.0000 mg | Freq: Once | INTRAMUSCULAR | Status: DC | PRN

## 2021-12-20 MED ORDER — MEPERIDINE HCL 50 MG/ML IJ SOLN: 50.0000 mg | Freq: Once | INTRAMUSCULAR | Status: DC | PRN

## 2021-12-20 MED ORDER — IOPAMIDOL (ISOVUE-M 300) INJECTION 61%
10.0000 mL | Freq: Once | INTRAMUSCULAR | Status: AC
  Administered 2021-12-20: 10 mL via INTRATHECAL

## 2021-12-20 MED ORDER — DIAZEPAM 5 MG PO TABS: 5.0000 mg | ORAL_TABLET | Freq: Once | ORAL | Status: DC

## 2021-12-20 NOTE — Discharge Instructions (Signed)

## 2022-03-22 DIAGNOSIS — E538 Deficiency of other specified B group vitamins: Secondary | ICD-10-CM | POA: Insufficient documentation

## 2022-03-22 DIAGNOSIS — F259 Schizoaffective disorder, unspecified: Secondary | ICD-10-CM | POA: Insufficient documentation

## 2022-05-04 ENCOUNTER — Other Ambulatory Visit: Payer: Self-pay | Admitting: Family Medicine

## 2022-05-04 ENCOUNTER — Ambulatory Visit
Admission: RE | Admit: 2022-05-04 | Discharge: 2022-05-04 | Disposition: A | Discharge status: Home / Self Care | Payer: Medicare Other | Source: Ambulatory Visit | Attending: Family Medicine | Admitting: Family Medicine

## 2022-05-04 DIAGNOSIS — R0989 Other specified symptoms and signs involving the circulatory and respiratory systems: Secondary | ICD-10-CM

## 2022-05-29 ENCOUNTER — Other Ambulatory Visit: Payer: Self-pay | Admitting: Family Medicine

## 2022-05-29 ENCOUNTER — Ambulatory Visit
Admission: RE | Admit: 2022-05-29 | Discharge: 2022-05-29 | Disposition: A | Discharge status: Home / Self Care | Payer: Medicare Other | Source: Ambulatory Visit | Attending: Family Medicine | Admitting: Family Medicine

## 2022-05-29 DIAGNOSIS — M545 Low back pain, unspecified: Secondary | ICD-10-CM

## 2022-05-29 DIAGNOSIS — W19XXXA Unspecified fall, initial encounter: Secondary | ICD-10-CM

## 2022-06-22 ENCOUNTER — Emergency Department (HOSPITAL_COMMUNITY)
Admission: EM | Admit: 2022-06-22 | Discharge: 2022-06-22 | Disposition: A | Discharge status: Home / Self Care | Payer: No Typology Code available for payment source | Attending: Emergency Medicine | Admitting: Emergency Medicine

## 2022-06-22 ENCOUNTER — Emergency Department (HOSPITAL_COMMUNITY): Payer: No Typology Code available for payment source

## 2022-06-22 ENCOUNTER — Encounter (HOSPITAL_COMMUNITY): Payer: Self-pay

## 2022-06-22 DIAGNOSIS — Z79899 Other long term (current) drug therapy: Secondary | ICD-10-CM | POA: Diagnosis not present

## 2022-06-22 DIAGNOSIS — Z7982 Long term (current) use of aspirin: Secondary | ICD-10-CM | POA: Insufficient documentation

## 2022-06-22 DIAGNOSIS — Z7901 Long term (current) use of anticoagulants: Secondary | ICD-10-CM | POA: Insufficient documentation

## 2022-06-22 DIAGNOSIS — M25512 Pain in left shoulder: Secondary | ICD-10-CM | POA: Diagnosis not present

## 2022-06-22 DIAGNOSIS — S0990XA Unspecified injury of head, initial encounter: Secondary | ICD-10-CM | POA: Insufficient documentation

## 2022-06-22 DIAGNOSIS — I4891 Unspecified atrial fibrillation: Secondary | ICD-10-CM | POA: Diagnosis not present

## 2022-06-22 DIAGNOSIS — W010XXA Fall on same level from slipping, tripping and stumbling without subsequent striking against object, initial encounter: Secondary | ICD-10-CM | POA: Diagnosis not present

## 2022-06-22 DIAGNOSIS — N3000 Acute cystitis without hematuria: Secondary | ICD-10-CM | POA: Insufficient documentation

## 2022-06-22 DIAGNOSIS — W19XXXA Unspecified fall, initial encounter: Secondary | ICD-10-CM

## 2022-06-22 LAB — CBC WITH DIFFERENTIAL/PLATELET
Abs Immature Granulocytes: 0.02 10*3/uL (ref 0.00–0.07)
Basophils Absolute: 0 10*3/uL (ref 0.0–0.1)
Basophils Relative: 1 %
Eosinophils Absolute: 0.3 10*3/uL (ref 0.0–0.5)
Eosinophils Relative: 5 %
HCT: 37 % — ABNORMAL LOW (ref 39.0–52.0)
Hemoglobin: 11.4 g/dL — ABNORMAL LOW (ref 13.0–17.0)
Immature Granulocytes: 0 %
Lymphocytes Relative: 21 %
Lymphs Abs: 1.3 10*3/uL (ref 0.7–4.0)
MCH: 28.4 pg (ref 26.0–34.0)
MCHC: 30.8 g/dL (ref 30.0–36.0)
MCV: 92.3 fL (ref 80.0–100.0)
Monocytes Absolute: 0.7 10*3/uL (ref 0.1–1.0)
Monocytes Relative: 11 %
Neutro Abs: 3.8 10*3/uL (ref 1.7–7.7)
Neutrophils Relative %: 62 %
Platelets: 236 10*3/uL (ref 150–400)
RBC: 4.01 MIL/uL — ABNORMAL LOW (ref 4.22–5.81)
RDW: 17.1 % — ABNORMAL HIGH (ref 11.5–15.5)
WBC: 6.1 10*3/uL (ref 4.0–10.5)
nRBC: 0 % (ref 0.0–0.2)

## 2022-06-22 LAB — URINALYSIS, ROUTINE W REFLEX MICROSCOPIC
Bilirubin Urine: NEGATIVE
Glucose, UA: NEGATIVE mg/dL
Hgb urine dipstick: NEGATIVE
Ketones, ur: NEGATIVE mg/dL
Nitrite: POSITIVE — AB
Protein, ur: NEGATIVE mg/dL
Specific Gravity, Urine: 1.018 (ref 1.005–1.030)
WBC, UA: >50 WBC/hpf — ABNORMAL HIGH (ref 0–5)
pH: 6 (ref 5.0–8.0)

## 2022-06-22 LAB — COMPREHENSIVE METABOLIC PANEL
ALT: 17 U/L (ref 0–44)
AST: 24 U/L (ref 15–41)
Albumin: 3.7 g/dL (ref 3.5–5.0)
Alkaline Phosphatase: 70 U/L (ref 38–126)
Anion gap: 6 (ref 5–15)
BUN: 34 mg/dL — ABNORMAL HIGH (ref 8–23)
CO2: 28 mmol/L (ref 22–32)
Calcium: 8.8 mg/dL — ABNORMAL LOW (ref 8.9–10.3)
Chloride: 99 mmol/L (ref 98–111)
Creatinine, Ser: 1.27 mg/dL — ABNORMAL HIGH (ref 0.61–1.24)
GFR, Estimated: 57 mL/min — ABNORMAL LOW (ref >60)
Glucose, Bld: 89 mg/dL (ref 70–99)
Potassium: 5 mmol/L (ref 3.5–5.1)
Sodium: 133 mmol/L — ABNORMAL LOW (ref 135–145)
Total Bilirubin: 0.5 mg/dL (ref 0.3–1.2)
Total Protein: 7.7 g/dL (ref 6.5–8.1)

## 2022-06-22 LAB — TROPONIN I (HIGH SENSITIVITY)
Troponin I (High Sensitivity): 4 ng/L (ref <18)
Troponin I (High Sensitivity): 4 ng/L (ref <18)

## 2022-06-22 MED ORDER — SODIUM CHLORIDE 0.9 % IV SOLN
1.0000 g | Freq: Once | INTRAVENOUS | Status: AC
  Administered 2022-06-22: 1 g via INTRAVENOUS
  Filled 2022-06-22: qty 10

## 2022-06-22 MED ORDER — CEFDINIR 300 MG PO CAPS: 300.0000 mg | ORAL_CAPSULE | Freq: Two times a day (BID) | ORAL | 0 refills | Status: DC

## 2022-06-22 NOTE — ED Provider Notes (Signed)
[facility] DEPT Provider Note   CSN: [id number] Arrival date & time: 06/22/22  1330     History  Chief Complaint  Patient presents with   Fall on thinners    [name] is a 81 y.o. male.  81 y.o male with a PMH of stroke, Afib on eliquis presents to the ED s/p fall. Patient reports he was at home 2 days ago when he suddenly tripped on his feet, fell backwards striking the back of his head.  Reports he did not lose consciousness.  He is endorsing pain along the left shoulder, specially with any movement.  Also endorsing pain along the left side of his head including along his left ear which has some significant bruising.  He reports while trying to make his bed today, he felt more so dizzy, also had another fall where he struck his head again.  He reports after striking his head twice he felt that it was necessary for him to be evaluated.  He is endorsing soreness all over.  Not tried any medication for improvement in symptoms.  Denies any abdominal pain, denies any nausea, no other complaints.  The history is provided by the patient and medical records.       Home Medications Prior to Admission medications   Medication Sig Start Date End Date Taking? Authorizing Provider  apixaban (ELIQUIS) 5 MG TABS tablet Take 1 tablet (5 mg total) by mouth 2 (two) times daily. 10/20/18   Thurnell Lose, MD  ascorbic acid (VITAMIN C) 250 MG tablet Take 1 tablet by mouth 2 (two) times daily. 09/02/19   [provider]  aspirin EC 81 MG tablet Take 81 mg by mouth daily.    [provider]  buPROPion (WELLBUTRIN) 100 MG tablet Take 100 mg by mouth 2 (two) times daily.    [provider]  cyanocobalamin (,VITAMIN B-12,) 1000 MCG/ML injection Inject 1 mL into the muscle every 28 (twenty-eight) days. 03/15/20   [provider]  diclofenac Sodium (VOLTAREN) 1 % GEL Apply 2 g topically 4 (four) times daily. Patient not taking: Reported  on 11/04/2021 11/16/19   Harold Hedge, MD  diltiazem (CARDIZEM CD) 120 MG 24 hr capsule Take 1 capsule (120 mg total) by mouth daily. 10/09/19   Hosie Poisson, MD  ferrous sulfate 325 (65 FE) MG tablet Take 325 mg by mouth daily. 03/15/20   [provider]  fluticasone (FLONASE) 50 MCG/ACT nasal spray Place 1 spray into both nostrils daily as needed for allergies or rhinitis.  07/30/18   [provider]  furosemide (LASIX) 20 MG tablet Take 20 mg by mouth daily.    [provider]  furosemide (LASIX) 80 MG tablet Take 1 tablet (80 mg total) by mouth 2 (two) times daily. Patient not taking: Reported on 11/04/2021 11/16/19 11/04/21  Harold Hedge, MD  gabapentin (NEURONTIN) 600 MG tablet Take 600 mg by mouth 3 (three) times daily. 03/15/20   [provider]  methocarbamol (ROBAXIN) 500 MG tablet Take 500 mg by mouth 2 (two) times daily. 08/19/18   [provider]  metoprolol succinate (TOPROL-XL) 25 MG 24 hr tablet Take 25 mg by mouth daily.    [provider]  metoprolol tartrate 75 MG TABS Take 75 mg by mouth 2 (two) times daily. Patient not taking: Reported on 11/04/2021 11/16/19 11/04/21  Harold Hedge, MD  pantoprazole (PROTONIX) 40 MG tablet Take 1 tablet (40 mg total) by mouth 2 (  two) times daily. 10/16/18   Thurnell Lose, MD  polyethylene glycol (MIRALAX / GLYCOLAX) packet Take 17 g by mouth daily.    [provider]  potassium chloride (K-DUR,KLOR-CON) 20 MEQ tablet Take 1 tablet (20 mEq total) by mouth daily. Patient taking differently: Take 20 mEq by mouth at bedtime. 09/20/18   Strader, Fransisco Hertz, PA-C  rOPINIRole (REQUIP) 0.25 MG tablet Take 0.25 mg by mouth 2 (two) times daily.  10/09/18   [provider]  simvastatin (ZOCOR) 40 MG tablet Take 40 mg by mouth at bedtime.    [provider]  spironolactone (ALDACTONE) 25 MG tablet Take 25 mg by mouth daily.    [provider]  tamsulosin (FLOMAX)  0.4 MG CAPS capsule Take 0.4 mg by mouth daily. 09/02/19   [provider]      Allergies    Lorazepam, Duloxetine, Pregabalin, and Zanaflex [tizanidine hcl]    Review of Systems   Review of Systems  Constitutional:  Negative for chills and fever.  HENT:  Negative for sore throat.   Respiratory:  Negative for shortness of breath.   Cardiovascular:  Negative for chest pain.  Gastrointestinal:  Negative for abdominal pain and vomiting.  Genitourinary:  Negative for flank pain.  Musculoskeletal:  Positive for arthralgias.  Skin:  Negative for pallor and wound.  Neurological:  Positive for dizziness and light-headedness.  All other systems reviewed and are negative.   Physical Exam Updated Vital Signs BP 127/87   Pulse 70   Temp 98.5 F (36.9 C) (Oral)   Resp 17   SpO2 98%  Physical Exam Vitals and nursing note reviewed.  Constitutional:      Appearance: Normal appearance.  HENT:     Head: Normocephalic.     Comments: Extensive bruising noted to the left side of his face, including left ear pain with palpation along the mastoid.    Mouth/Throat:     Mouth: Mucous membranes are moist.  Eyes:     Pupils: Pupils are equal, round, and reactive to light.  Cardiovascular:     Rate and Rhythm: Normal rate.  Pulmonary:     Effort: Pulmonary effort is normal.     Breath sounds: Normal breath sounds.    Abdominal:     General: Abdomen is flat.  Musculoskeletal:     Cervical back: Normal range of motion and neck supple.  Skin:    General: Skin is warm and dry.  Neurological:     Mental Status: He is alert and oriented to person, place, and time.     ED Results / Procedures / Treatments   Labs (all labs ordered are listed, but only abnormal results are displayed) Labs Reviewed  CBC WITH DIFFERENTIAL/PLATELET - Abnormal; Notable for the following components:      Result Value   RBC 4.01 (*)    Hemoglobin 11.4 (*)    HCT 37.0 (*)    RDW 17.1 (*)    All other  components within normal limits  COMPREHENSIVE METABOLIC PANEL - Abnormal; Notable for the following components:   Sodium 133 (*)    BUN 34 (*)    Creatinine, Ser 1.27 (*)    Calcium 8.8 (*)    GFR, Estimated 57 (*)    All other components within normal limits  URINALYSIS, ROUTINE W REFLEX MICROSCOPIC - Abnormal; Notable for the following components:   APPearance HAZY (*)    Nitrite POSITIVE (*)    Leukocytes,Ua MODERATE (*)  WBC, UA >50 (*)    Bacteria, UA RARE (*)    All other components within normal limits  URINE CULTURE  TROPONIN I (HIGH SENSITIVITY)  TROPONIN I (HIGH SENSITIVITY)    EKG EKG Interpretation  Date/Time:  Friday June 22 2022 14:52:00 EDT Ventricular Rate:  75 PR Interval:    QRS Duration: 92 QT Interval:  409 QTC Calculation: 457 R Axis:   132 Text Interpretation: Atrial fibrillation Lateral infarct, age indeterminate Confirmed by Pattricia Boss [phone]) on 06/22/2022 2:55:23 PM  Radiology CT Cervical Spine Wo Contrast  Result Date: 06/22/2022 CLINICAL DATA:  Patient states that on Wednesday night he fell when getting out of bed. Landing on his left shoulder and striking his head on the ground. EXAM: CT HEAD WITHOUT CONTRAST CT CERVICAL SPINE WITHOUT CONTRAST TECHNIQUE: Multidetector CT imaging of the head and cervical spine was performed following the standard protocol without intravenous contrast. Multiplanar CT image reconstructions of the cervical spine were also generated. RADIATION DOSE REDUCTION: This exam was performed according to the departmental dose-optimization program which includes automated exposure control, adjustment of the mA and/or kV according to patient size and/or use of iterative reconstruction technique. COMPARISON:  None Available. CT examination dated November 03, 2021; CT cervical spine dated December 20, 2021 FINDINGS: CT HEAD FINDINGS Brain: No evidence of acute infarction, hemorrhage, hydrocephalus, extra-axial collection or mass  lesion/mass effect. Moderate cerebral atrophy and chronic microvascular ischemic changes of the white matter, unchanged. Evaluation of posterior fossa is somewhat limited due to streak artifact from hardware. Vascular: No hyperdense vessel or unexpected calcification. Skull: No acute abnormality. Partially imaged posterior cervical/occipital fusion. CT CERVICAL SPINE FINDINGS Alignment: Straightening of the cervical spine with extensive posterior spinal fusion hardware. Anterolisthesis of C6 with anterior spinal fusion at C6-C7, unchanged. Skull base and vertebrae: No acute fracture. No primary bone lesion or focal pathologic process. Evaluation is however limited due to osteopenia and streak artifact. Soft tissues and spinal canal: No prevertebral fluid or swelling. No visible canal hematoma. Disc levels: Posterior cervical fusion hardware from the skull base to T1 appears intact. No appreciable perihardware loosening. Anterior spinal fusion at C6-C7 remains intact and unchanged. Spinal canal appears patent. Mild left neural foraminal stenosis at C3-C4. Evaluation of neural foramina is significantly limited due to streak artifact and heterotopic ossification. Within the limitations there is no significant neural foraminal stenosis. Upper chest: Negative. Other: None IMPRESSION: CT head: 1. No acute intracranial abnormality. Moderate cerebral atrophy and chronic microvascular ischemic changes of the white matter, unchanged. Evaluation of posterior fossa is limited due to streak artifact. CT cervical spine: 1. Extensive posterior spinal fusion hardware from skull base to the T1, unchanged. No evidence of acute fracture or subluxation. Hardware appear maintained. 2. No significant spinal canal stenosis. Evaluation of neural foramina is limited due to streak artifact from the hardware and heterotopic ossification, no significant spinal canal or neural foraminal stenosis. Electronically Signed   By: Keane Police D.O.    On: 06/22/2022 15:08   CT HEAD WO CONTRAST (5MM)  Result Date: 06/22/2022 CLINICAL DATA:  Patient states that on Wednesday night he fell when getting out of bed. Landing on his left shoulder and striking his head on the ground. EXAM: CT HEAD WITHOUT CONTRAST CT CERVICAL SPINE WITHOUT CONTRAST TECHNIQUE: Multidetector CT imaging of the head and cervical spine was performed following the standard protocol without intravenous contrast. Multiplanar CT image reconstructions of the cervical spine were also generated. RADIATION DOSE REDUCTION: This exam was performed  according to the departmental dose-optimization program which includes automated exposure control, adjustment of the mA and/or kV according to patient size and/or use of iterative reconstruction technique. COMPARISON:  None Available. CT examination dated November 03, 2021; CT cervical spine dated December 20, 2021 FINDINGS: CT HEAD FINDINGS Brain: No evidence of acute infarction, hemorrhage, hydrocephalus, extra-axial collection or mass lesion/mass effect. Moderate cerebral atrophy and chronic microvascular ischemic changes of the white matter, unchanged. Evaluation of posterior fossa is somewhat limited due to streak artifact from hardware. Vascular: No hyperdense vessel or unexpected calcification. Skull: No acute abnormality. Partially imaged posterior cervical/occipital fusion. CT CERVICAL SPINE FINDINGS Alignment: Straightening of the cervical spine with extensive posterior spinal fusion hardware. Anterolisthesis of C6 with anterior spinal fusion at C6-C7, unchanged. Skull base and vertebrae: No acute fracture. No primary bone lesion or focal pathologic process. Evaluation is however limited due to osteopenia and streak artifact. Soft tissues and spinal canal: No prevertebral fluid or swelling. No visible canal hematoma. Disc levels: Posterior cervical fusion hardware from the skull base to T1 appears intact. No appreciable perihardware loosening.  Anterior spinal fusion at C6-C7 remains intact and unchanged. Spinal canal appears patent. Mild left neural foraminal stenosis at C3-C4. Evaluation of neural foramina is significantly limited due to streak artifact and heterotopic ossification. Within the limitations there is no significant neural foraminal stenosis. Upper chest: Negative. Other: None IMPRESSION: CT head: 1. No acute intracranial abnormality. Moderate cerebral atrophy and chronic microvascular ischemic changes of the white matter, unchanged. Evaluation of posterior fossa is limited due to streak artifact. CT cervical spine: 1. Extensive posterior spinal fusion hardware from skull base to the T1, unchanged. No evidence of acute fracture or subluxation. Hardware appear maintained. 2. No significant spinal canal stenosis. Evaluation of neural foramina is limited due to streak artifact from the hardware and heterotopic ossification, no significant spinal canal or neural foraminal stenosis. Electronically Signed   By: Keane Police D.O.   On: 06/22/2022 15:08   DG Shoulder Left  Result Date: 06/22/2022 CLINICAL DATA:  fall and pain EXAM: LEFT SHOULDER - 2+ VIEW COMPARISON:  October 15, 2018 FINDINGS: There is no evidence of fracture or dislocation. Again seen are severe degenerative changes with loss of the glenohumeral joint, subchondral sclerosis and lucencies at the humeral head and bony glenoid. Mild superior subluxation of the humerus likely on the basis of degeneration. Soft tissues are unremarkable. IMPRESSION: No fracture or dislocation. Severe degenerative changes with mild superior subluxation of the humerus. Superior subluxation appears new since the previous study. Electronically Signed   By: Frazier Richards M.D.   On: 06/22/2022 14:25    Procedures Procedures    Medications Ordered in ED Medications  cefTRIAXone (ROCEPHIN) 1 g in sodium chloride 0.9 % 100 mL IVPB (0 g Intravenous Stopped 06/22/22 1859)    ED Course/ Medical Decision  Making/ A&P Clinical Course as of 06/23/22 1610  Fri Jun 22, 2022  1524 ED EKG [JS]  1524 Pending UA to d/c as long as clear [CR]    Clinical Course User Index [CR] Wilnette Kales, PA [JS] Janeece Fitting, PA-C                           Medical Decision Making Amount and/or Complexity of Data Reviewed Labs: ordered. Radiology: ordered. ECG/medicine tests:  Decision-making details documented in ED Course.   This patient presents to the ED for concern of fall, this involves a number of treatment  options, and is a complaint that carries with it a high risk of complications and morbidity.    Co morbidities: Discussed in HPI   Brief History:  Patient presents to the ED status post fall x2.  Currently on Eliquis due to ongoing A-fib.  Now endorsing some dizziness that worsened today while trying to make his bed.  Has significant bruising to the left ear.   EMR reviewed including pt PMHx, past surgical history and past visits to ER.   See HPI for more details   Lab Tests:  I ordered and independently interpreted labs.  The pertinent results include:    I personally reviewed all laboratory work and imaging. Metabolic panel without any acute abnormality specifically kidney function within normal limits and no significant electrolyte abnormalities. CBC without leukocytosis or significant anemia.   Imaging Studies:  CT Head and neck showed: CT head:    1. No acute intracranial abnormality. Moderate cerebral atrophy and  chronic microvascular ischemic changes of the white matter,  unchanged. Evaluation of posterior fossa is limited due to streak  artifact.    CT cervical spine:    1. Extensive posterior spinal fusion hardware from skull base to the  T1, unchanged. No evidence of acute fracture or subluxation.  Hardware appear maintained.    2. No significant spinal canal stenosis. Evaluation of neural  foramina is limited due to streak artifact from the hardware and   heterotopic ossification, no significant spinal canal or neural  foraminal stenosis.    Cardiac Monitoring:  The patient was maintained on a cardiac monitor.  I personally viewed and interpreted the cardiac monitored which showed an underlying rhythm of: Afib EKG non-ischemic   Medicines ordered:  I ordered medication including tylenol  for pain Reevaluation of the patient after these medicines showed that the patient stayed the same I have reviewed the patients home medicines and have made adjustments as needed  Reevaluation:  After the interventions noted above I re-evaluated patient and found that they have :stayed the same   Social Determinants of Health:  The patient's social determinants of health were a factor in the care of this patient    Problem List / ED Course:  Patient presents to the ED status post 2 falls over the last couple of days.  First fall sounded to be mechanical in nature while he tripped over his feet.  On today's visit he did feel lightheaded and dizzy while trying to make his bed and fell backwards.  He is currently on Eliquis.  Labs were obtained to check for any electrolyte derangement causing his falls.  Labs are at baseline.  Troponin is negative, he is without any chest pain or shortness of breath on today's visit.  UA is pending.  Scans were ordered of his CT head, CT cervical spine, shoulder x-ray which were all negative. Discussed care of this patient with my attending Dr. Zenia Resides who evaluated patient and agrees with management at this time   Dispostion:  Patient with pending workup, patient care signed out to incoming provider.    Portions of this note were generated with [employer]. Dictation errors may occur despite best attempts at proofreading.   Final Clinical Impression(s) / ED Diagnoses Final diagnoses:  Fall, initial encounter  Traumatic injury of head, initial encounter  Acute pain of left shoulder  Acute  cystitis without hematuria    Rx / DC Orders ED Discharge Orders          Ordered  cefdinir (OMNICEF) 300 MG capsule  2 times daily,   Status:  Discontinued        06/22/22 1708              Janeece Fitting, PA-C 06/23/22 4068    Lacretia Leigh, MD 06/25/22 [phone]

## 2022-06-22 NOTE — Discharge Instructions (Addendum)
Note that your work-up today was overall negative for brain bleed or fracture of your head or neck.  I will provide orthopedic information to follow-up regarding her left shoulder if needed. You can take Tylenol as needed for pain of your left shoulder.  Your work-up was also consistent of a urinary tract infection.  We treated this while you are in the emergency department through IV antibiotics.  Please keep your appointment for Monday of next week with your PCP for reevaluation of symptoms.  Please do not hesitate to return to the emergency department if the worrisome signs and symptoms we discussed become apparent.

## 2022-06-22 NOTE — ED Provider Notes (Signed)
  Physical Exam  BP 121/83   Pulse 69   Temp 98 F (36.7 C) (Oral)   Resp 12   SpO2 95%   Physical Exam  Procedures  Procedures  ED Course / MDM   Clinical Course as of 06/22/22 1533  Fri Jun 22, 2022  1524 ED EKG [JS]  1524 Pending UA to d/c as long as clear [CR]    Clinical Course User Index [CR] Wilnette Kales, PA [JS] Janeece Fitting, PA-C   Medical Decision Making Amount and/or Complexity of Data Reviewed Labs: ordered. Radiology: ordered. ECG/medicine tests:  Decision-making details documented in ED Course.   81 year old male presents with history of stroke and is on Eliquis for A-fib presents with fall.  At shift change patient handoff from [facility], [place].  Plan is to wait for EKG and UA results and discharge as long as normal.   Repeat troponin negative.  Urinalysis significant for urinary tract infection including positive nitrite, moderate leukocyte, greater than 50 WBC and rare bacteria.  We will treat outpatient with oral antibiotics.  Treatment plan was discussed at length with patient, the patient was agreeable.  He agreed to close follow-up with PCP Monday or Tuesday for re-evaluation of symptoms.  CT head and C-spine negative for any acute abnormality.  Left shoulder x-ray showed mild superior subluxation of humeral head with no dislocation.  Symptomatic therapy with Tylenol, rest, ice, elevation of affected extremity.  Orthopedic information provided for follow-up.  Worrisome signs and symptoms were discussed with the patient and the patient knowledge understanding to return to emergency department if noticed.      Wilnette Kales, [place] 06/22/22 1706    Pattricia Boss, MD 06/23/22 [phone]

## 2022-06-22 NOTE — ED Provider Notes (Signed)
AndI provided a substantive portion of the care of this patient.  I personally performed the entirety of the medical decision making for this encounter.  EKG Interpretation  Date/Time:  Friday June 22 2022 14:52:00 EDT Ventricular Rate:  75 PR Interval:    QRS Duration: 92 QT Interval:  409 QTC Calculation: 457 R Axis:   132 Text Interpretation: Atrial fibrillation Lateral infarct, age indeterminate Confirmed by Pattricia Boss [phone]) on 06/22/2022 2:22:54 PM   81 year old patient presents due to mechanical fall.  Patient struck his left shoulder and head.  Head CT without significant abnormality per my interpretation.  Check x-rays that are pending and likely to be admitted   Lacretia Leigh, MD 06/22/22 1502

## 2022-06-22 NOTE — ED Triage Notes (Signed)
Pt states that on Wednesday night he fell when getting out of bed, landing on his L shoulder and striking his head on the ground. Pt endorses LOC. Pt anticoagulated on Eliquis. Pt states that he was seen outpatient earlier today and referred to ED for head CT. Pt wearing L shoulder sling during triage. A+Ox4, PERRLA

## 2022-06-25 LAB — URINE CULTURE: Culture: >=100000 — AB

## 2022-06-26 ENCOUNTER — Telehealth: Payer: Self-pay | Admitting: *Deleted

## 2022-06-26 NOTE — Telephone Encounter (Signed)
Post ED Visit - Positive Culture Follow-up  Culture report reviewed by antimicrobial stewardship pharmacist: [place] Team '[]'$  Elenor Quinones, Pharm.D. '[]'$  Heide Guile, Pharm.D., BCPS AQ-ID '[]'$  Parks Neptune, Pharm.D., BCPS '[]'$  Alycia Rossetti, Pharm.D., BCPS '[]'$  [place], [place].D., BCPS, AAHIVP '[]'$  Legrand Como, Pharm.D., BCPS, AAHIVP '[]'$  Salome Arnt, PharmD, BCPS '[]'$  Johnnette Gourd, PharmD, BCPS '[]'$  Hughes Better, PharmD, BCPS '[]'$  Leeroy Cha, PharmD '[]'$  Laqueta Linden, PharmD, BCPS '[]'$  Albertina Parr, PharmD  [place] Team '[]'$  Leodis Sias, PharmD '[]'$  Lindell Spar, PharmD '[]'$  Royetta Asal, PharmD '[]'$  Graylin Shiver, Rph '[]'$  Rema Fendt) Glennon Mac, PharmD '[]'$  Arlyn Dunning, PharmD '[]'$  Netta Cedars, PharmD '[]'$  Dia Sitter, PharmD '[]'$  Leone Haven, PharmD '[]'$  Gretta Arab, PharmD '[]'$  Theodis Shove, PharmD '[]'$  Peggyann Juba, PharmD '[]'$  Reuel Boom, PharmD   Positive urine culture Treated with Cefdinir, organism sensitive to the same and no further patient follow-up is required at this time. Alycia Rossetti, PharmD  Harlon Flor Talley 06/26/2022, 10:47 AM

## 2022-07-24 ENCOUNTER — Other Ambulatory Visit: Payer: Self-pay | Admitting: Family Medicine

## 2022-07-24 DIAGNOSIS — M810 Age-related osteoporosis without current pathological fracture: Secondary | ICD-10-CM

## 2022-08-01 ENCOUNTER — Encounter (HOSPITAL_BASED_OUTPATIENT_CLINIC_OR_DEPARTMENT_OTHER): Payer: Self-pay | Admitting: Emergency Medicine

## 2022-08-01 ENCOUNTER — Emergency Department (HOSPITAL_BASED_OUTPATIENT_CLINIC_OR_DEPARTMENT_OTHER)
Admission: EM | Admit: 2022-08-01 | Discharge: 2022-08-01 | Disposition: A | Discharge status: Home / Self Care | Payer: Medicare Other | Attending: Emergency Medicine | Admitting: Emergency Medicine

## 2022-08-01 ENCOUNTER — Emergency Department (HOSPITAL_BASED_OUTPATIENT_CLINIC_OR_DEPARTMENT_OTHER): Payer: Medicare Other

## 2022-08-01 ENCOUNTER — Other Ambulatory Visit: Payer: Self-pay

## 2022-08-01 DIAGNOSIS — Z7901 Long term (current) use of anticoagulants: Secondary | ICD-10-CM | POA: Insufficient documentation

## 2022-08-01 DIAGNOSIS — M5030 Other cervical disc degeneration, unspecified cervical region: Secondary | ICD-10-CM | POA: Insufficient documentation

## 2022-08-01 DIAGNOSIS — M542 Cervicalgia: Secondary | ICD-10-CM | POA: Diagnosis present

## 2022-08-01 DIAGNOSIS — M50323 Other cervical disc degeneration at C6-C7 level: Secondary | ICD-10-CM | POA: Diagnosis not present

## 2022-08-01 DIAGNOSIS — W1839XA Other fall on same level, initial encounter: Secondary | ICD-10-CM | POA: Diagnosis not present

## 2022-08-01 DIAGNOSIS — M549 Dorsalgia, unspecified: Secondary | ICD-10-CM | POA: Insufficient documentation

## 2022-08-01 DIAGNOSIS — Z7982 Long term (current) use of aspirin: Secondary | ICD-10-CM | POA: Insufficient documentation

## 2022-08-01 DIAGNOSIS — I6782 Cerebral ischemia: Secondary | ICD-10-CM | POA: Diagnosis not present

## 2022-08-01 DIAGNOSIS — I10 Essential (primary) hypertension: Secondary | ICD-10-CM | POA: Insufficient documentation

## 2022-08-01 DIAGNOSIS — Z79899 Other long term (current) drug therapy: Secondary | ICD-10-CM | POA: Insufficient documentation

## 2022-08-01 MED ORDER — DEXAMETHASONE SODIUM PHOSPHATE 10 MG/ML IJ SOLN
10.0000 mg | Freq: Once | INTRAMUSCULAR | Status: AC
Start: 2022-08-01 — End: 2022-08-01
  Administered 2022-08-01: 10 mg via INTRAMUSCULAR
  Filled 2022-08-01: qty 1

## 2022-08-01 NOTE — Discharge Instructions (Signed)
The work-up in the ER is reassuring.  CAT scan is showing degenerative spine disease that is not new.  CAT scan of your brain is reassuring, there is no evidence of brain bleed.  Follow-up with your primary care doctor for further assessment.

## 2022-08-01 NOTE — ED Provider Notes (Signed)
[name] Provider Note   CSN: [id number] Arrival date & time: 08/01/22  0945     History  Chief Complaint  Patient presents with   [name] is a 81 y.o. male.  HPI     81 year old male comes in with chief complaint of fall. Patient has history of chronic pain for which she sees pain specialist, cervical spine degenerative disease, A-fib on blood thinners.  Patient states that yesterday he stumbled while walking, as a shoe got caught.  He does not think he fell, but not 100% sure.  Since then he has been having pain over his neck and back.  The pain is described as sharp, severe and it is worse with any kind of movement.   Pt has no associated nausea, vomiting, seizures, loss of consciousness or new visual complains, weakness, numbness, dizziness or gait instability.  Home Medications Prior to Admission medications   Medication Sig Start Date End Date Taking? Authorizing Provider  apixaban (ELIQUIS) 5 MG TABS tablet Take 1 tablet (5 mg total) by mouth 2 (two) times daily. 10/20/18   Thurnell Lose, MD  ascorbic acid (VITAMIN C) 250 MG tablet Take 1 tablet by mouth 2 (two) times daily. 09/02/19   [provider]  aspirin EC 81 MG tablet Take 81 mg by mouth daily.    [provider]  buPROPion (WELLBUTRIN) 100 MG tablet Take 100 mg by mouth 2 (two) times daily.    [provider]  cyanocobalamin (,VITAMIN B-12,) 1000 MCG/ML injection Inject 1 mL into the muscle every 28 (twenty-eight) days. 03/15/20   [provider]  diclofenac Sodium (VOLTAREN) 1 % GEL Apply 2 g topically 4 (four) times daily. Patient not taking: Reported on 11/04/2021 11/16/19   Harold Hedge, MD  diltiazem (CARDIZEM CD) 120 MG 24 hr capsule Take 1 capsule (120 mg total) by mouth daily. 10/09/19   Hosie Poisson, MD  ferrous sulfate 325 (65 FE) MG tablet Take 325 mg by mouth daily. 03/15/20   [provider]  fluticasone  (FLONASE) 50 MCG/ACT nasal spray Place 1 spray into both nostrils daily as needed for allergies or rhinitis.  07/30/18   [provider]  furosemide (LASIX) 20 MG tablet Take 20 mg by mouth daily.    [provider]  furosemide (LASIX) 80 MG tablet Take 1 tablet (80 mg total) by mouth 2 (two) times daily. Patient not taking: Reported on 11/04/2021 11/16/19 11/04/21  Harold Hedge, MD  gabapentin (NEURONTIN) 600 MG tablet Take 600 mg by mouth 3 (three) times daily. 03/15/20   [provider]  methocarbamol (ROBAXIN) 500 MG tablet Take 500 mg by mouth 2 (two) times daily. 08/19/18   [provider]  metoprolol succinate (TOPROL-XL) 25 MG 24 hr tablet Take 25 mg by mouth daily.    [provider]  metoprolol tartrate 75 MG TABS Take 75 mg by mouth 2 (two) times daily. Patient not taking: Reported on 11/04/2021 11/16/19 11/04/21  Harold Hedge, MD  pantoprazole (PROTONIX) 40 MG tablet Take 1 tablet (40 mg total) by mouth 2 (two) times daily. 10/16/18   Thurnell Lose, MD  polyethylene glycol (MIRALAX / GLYCOLAX) packet Take 17 g by mouth daily.    [provider]  potassium chloride (K-DUR,KLOR-CON) 20 MEQ tablet Take 1 tablet (20 mEq total) by mouth daily. Patient taking differently: Take 20 mEq by mouth at bedtime. 09/20/18   Erma Heritage,  PA-C  rOPINIRole (REQUIP) 0.25 MG tablet Take 0.25 mg by mouth 2 (two) times daily.  10/09/18   [provider]  simvastatin (ZOCOR) 40 MG tablet Take 40 mg by mouth at bedtime.    [provider]  spironolactone (ALDACTONE) 25 MG tablet Take 25 mg by mouth daily.    [provider]  tamsulosin (FLOMAX) 0.4 MG CAPS capsule Take 0.4 mg by mouth daily. 09/02/19   [provider]      Allergies    Lorazepam, Duloxetine, Pregabalin, and Zanaflex [tizanidine hcl]    Review of Systems   Review of Systems  All other systems reviewed and are negative.   Physical  Exam Updated Vital Signs BP (!) 131/90   Pulse 79   Temp 98.3 F (36.8 C)   Resp 15   SpO2 93%  Physical Exam Vitals and nursing note reviewed.  Constitutional:      Appearance: He is well-developed.  HENT:     Head: Atraumatic.  Eyes:     Extraocular Movements: Extraocular movements intact.     Pupils: Pupils are equal, round, and reactive to light.  Cardiovascular:     Rate and Rhythm: Normal rate.  Pulmonary:     Effort: Pulmonary effort is normal.  Musculoskeletal:     Cervical back: Neck supple.  Skin:    General: Skin is warm.  Neurological:     Mental Status: He is alert and oriented to person, place, and time.     ED Results / Procedures / Treatments   Labs (all labs ordered are listed, but only abnormal results are displayed) Labs Reviewed - No data to display  EKG EKG Interpretation  Date/Time:  Wednesday August 01 2022 10:27:07 EDT Ventricular Rate:  58 PR Interval:    QRS Duration: 84 QT Interval:  414 QTC Calculation: 406 R Axis:   40 Text Interpretation: Atrial fibrillation with slow ventricular response Anterior infarct , age undetermined Abnormal ECG When compared with ECG of 22-Jun-2022 14:52, PREVIOUS ECG IS PRESENT No significant change since last tracing Confirmed by Varney Biles [phone]) on 08/01/2022 1:46:23 PM  Radiology CT Head Wo Contrast  Result Date: 08/01/2022 CLINICAL DATA:  Twisted to the RIGHT 1 day ago, causing immediate pain in RIGHT-side of neck and upper thoracic spine, prior cervical spine fusion C4-C7. Neck and head trauma. History stroke, hypertension, atrial fibrillation, CHF EXAM: CT HEAD WITHOUT CONTRAST CT CERVICAL SPINE WITHOUT CONTRAST TECHNIQUE: Multidetector CT imaging of the head and cervical spine was performed following the standard protocol without intravenous contrast. Multiplanar CT image reconstructions of the cervical spine were also generated. RADIATION DOSE REDUCTION: This exam was performed according to the  departmental dose-optimization program which includes automated exposure control, adjustment of the mA and/or kV according to patient size and/or use of iterative reconstruction technique. COMPARISON:  06/22/2022 FINDINGS: CT HEAD FINDINGS Brain: Beam hardening artifacts at posterior fossa from prior posterior occipitocervical fusion. Generalized atrophy. Normal ventricular morphology. No midline shift or mass effect. Small vessel chronic ischemic changes of deep cerebral white matter. Old medial RIGHT cerebellar infarct questioned, though assessment of the posterior fossa is limited by beam hardening artifacts. Vascular: Atherosclerotic calcification of internal carotid arteries at skull base. No hyperdense vessels. Skull: Intact Sinuses/Orbits: Clear Other: N/A CT CERVICAL SPINE FINDINGS Alignment: Minimal anterolisthesis at C2-C3. Prominent anterolisthesis at C6-C7 approximately 8 mm unchanged. Skull base and vertebrae: Anterior plate and screws Z1-I4. Posterior cervical fusion extending from occiput to T1. Fusion of odontoid process with  anterior arch of C1 and skull base. Diffuse facet joint effusions. Orthopedic hardware appears intact. Bones demineralized. No definite acute fracture, additional subluxation, or bone destruction. Degenerative disc disease changes upper thoracic spine. Soft tissues and spinal canal: Prevertebral soft tissues normal thickness. Prominent atherosclerotic calcifications at carotid bifurcations bilaterally. Multiple streak artifacts traverse cervical spinal canal. Disc levels:  No specific abnormalities Upper chest: Lung apices clear Other: N/A IMPRESSION: Atrophy with small vessel chronic ischemic changes of deep cerebral white matter. Question old medial RIGHT cerebellar infarct. No acute intracranial abnormalities. Postsurgical changes of posterior occipitocervical fusion through T1. Degenerative disc and facet disease changes cervical spine with chronic anterolisthesis at C6-C7.  No acute cervical spine abnormalities. Electronically Signed   By: Lavonia Dana M.D.   On: 08/01/2022 13:20   CT Cervical Spine Wo Contrast  Result Date: 08/01/2022 CLINICAL DATA:  Twisted to the RIGHT 1 day ago, causing immediate pain in RIGHT-side of neck and upper thoracic spine, prior cervical spine fusion C4-C7. Neck and head trauma. History stroke, hypertension, atrial fibrillation, CHF EXAM: CT HEAD WITHOUT CONTRAST CT CERVICAL SPINE WITHOUT CONTRAST TECHNIQUE: Multidetector CT imaging of the head and cervical spine was performed following the standard protocol without intravenous contrast. Multiplanar CT image reconstructions of the cervical spine were also generated. RADIATION DOSE REDUCTION: This exam was performed according to the departmental dose-optimization program which includes automated exposure control, adjustment of the mA and/or kV according to patient size and/or use of iterative reconstruction technique. COMPARISON:  06/22/2022 FINDINGS: CT HEAD FINDINGS Brain: Beam hardening artifacts at posterior fossa from prior posterior occipitocervical fusion. Generalized atrophy. Normal ventricular morphology. No midline shift or mass effect. Small vessel chronic ischemic changes of deep cerebral white matter. Old medial RIGHT cerebellar infarct questioned, though assessment of the posterior fossa is limited by beam hardening artifacts. Vascular: Atherosclerotic calcification of internal carotid arteries at skull base. No hyperdense vessels. Skull: Intact Sinuses/Orbits: Clear Other: N/A CT CERVICAL SPINE FINDINGS Alignment: Minimal anterolisthesis at C2-C3. Prominent anterolisthesis at C6-C7 approximately 8 mm unchanged. Skull base and vertebrae: Anterior plate and screws L9-J6. Posterior cervical fusion extending from occiput to T1. Fusion of odontoid process with anterior arch of C1 and skull base. Diffuse facet joint effusions. Orthopedic hardware appears intact. Bones demineralized. No definite  acute fracture, additional subluxation, or bone destruction. Degenerative disc disease changes upper thoracic spine. Soft tissues and spinal canal: Prevertebral soft tissues normal thickness. Prominent atherosclerotic calcifications at carotid bifurcations bilaterally. Multiple streak artifacts traverse cervical spinal canal. Disc levels:  No specific abnormalities Upper chest: Lung apices clear Other: N/A IMPRESSION: Atrophy with small vessel chronic ischemic changes of deep cerebral white matter. Question old medial RIGHT cerebellar infarct. No acute intracranial abnormalities. Postsurgical changes of posterior occipitocervical fusion through T1. Degenerative disc and facet disease changes cervical spine with chronic anterolisthesis at C6-C7. No acute cervical spine abnormalities. Electronically Signed   By: Lavonia Dana M.D.   On: 08/01/2022 13:20    Procedures Procedures    Medications Ordered in ED Medications  dexamethasone (DECADRON) injection 10 mg (10 mg Intramuscular Given 08/01/22 1254)    ED Course/ Medical Decision Making/ A&P                           Medical Decision Making Amount and/or Complexity of Data Reviewed Radiology: ordered.  Risk Prescription drug management.   81 year old patient comes in with chief complaint of neck pain, back pain without any associated neurologic symptoms.  He has history of stroke, A-fib, chronic degenerative disease of the cervical spine.  On exam, patient has no focal neurodeficits.  Upper and lower extremity strength is normal, sensation is intact in the upper and lower extremities.  Patient has no meningismus.  Differential diagnosis includes acute on chronic exacerbation of degenerative spine disease and brain bleed given questionable fall, and patient is on Eliquis.  Discussed with the patient that suspicion for anything acute likely fracture is low.  History is not indicative of any cervical spinal cord disease.  So we would like to  focus on conservative measures.  Patient however states that his pain is pretty excruciating would want to make sure that the image is negative.  With him having a fall, CT brain was already on plan.  We will add CT cervical spine as well.  If work-up is reassuring, patient will be discharged.  I reviewed patient's previous imaging including CT cervical spine that was done few weeks back.  Also reviewed [facility] medication database, patient is on 10 mg of oxycodone along with gabapentin.   3:23 PM The patient appears reasonably screened and/or stabilized for discharge and I doubt any other medical condition or other [facility] requiring further screening, evaluation, or treatment in the ED at this time prior to discharge.   Results from the ER workup discussed with the patient face to face and all questions answered to the best of my ability. The patient is safe for discharge with strict return precautions.   Final Clinical Impression(s) / ED Diagnoses Final diagnoses:  Degenerative cervical disc    Rx / DC Orders ED Discharge Orders     None         Varney Biles, MD 08/01/22 1523

## 2022-08-01 NOTE — ED Notes (Signed)
Pt twisted to the right a day ago. Caused immediate pain to the right side of his neck and thoracic part of his spine. Prev fusions of C4-C5, C5-C6 and C6-C7, all according to the Pt. No new numbness/tingling and has the ability to move all extremities per his normal. No obvious new injury to his back.

## 2022-08-01 NOTE — ED Triage Notes (Signed)
Pt stumbled yesterday (not sure if he fell), but his neck, chest and lower back severe pain.

## 2022-08-01 NOTE — ED Triage Notes (Signed)
Pt is in permanent afib, on eliquis.

## 2022-08-01 NOTE — ED Notes (Signed)
Pt was able to ambulate but there was pain in his back while ambulating. No numbness/tingling or N/V or feeling lightheaded.

## 2022-08-01 NOTE — ED Notes (Signed)
Discharge paperwork given and verbally understood. 

## 2022-09-04 ENCOUNTER — Ambulatory Visit
Admission: RE | Admit: 2022-09-04 | Discharge: 2022-09-04 | Disposition: A | Discharge status: Home / Self Care | Payer: Medicare Other | Source: Ambulatory Visit | Attending: Family Medicine | Admitting: Family Medicine

## 2022-09-04 DIAGNOSIS — M810 Age-related osteoporosis without current pathological fracture: Secondary | ICD-10-CM

## 2022-10-30 ENCOUNTER — Emergency Department
Admission: EM | Admit: 2022-10-30 | Discharge: 2022-10-30 | Disposition: A | Discharge status: Home / Self Care | Payer: No Typology Code available for payment source | Attending: Emergency Medicine | Admitting: Emergency Medicine

## 2022-10-30 ENCOUNTER — Other Ambulatory Visit: Payer: Self-pay

## 2022-10-30 ENCOUNTER — Emergency Department: Payer: No Typology Code available for payment source

## 2022-10-30 ENCOUNTER — Encounter: Payer: Self-pay | Admitting: Emergency Medicine

## 2022-10-30 DIAGNOSIS — I4891 Unspecified atrial fibrillation: Secondary | ICD-10-CM | POA: Insufficient documentation

## 2022-10-30 DIAGNOSIS — W19XXXA Unspecified fall, initial encounter: Secondary | ICD-10-CM | POA: Insufficient documentation

## 2022-10-30 DIAGNOSIS — Z7901 Long term (current) use of anticoagulants: Secondary | ICD-10-CM | POA: Diagnosis not present

## 2022-10-30 DIAGNOSIS — I10 Essential (primary) hypertension: Secondary | ICD-10-CM | POA: Diagnosis not present

## 2022-10-30 DIAGNOSIS — S80911A Unspecified superficial injury of right knee, initial encounter: Secondary | ICD-10-CM | POA: Diagnosis present

## 2022-10-30 DIAGNOSIS — S8001XA Contusion of right knee, initial encounter: Secondary | ICD-10-CM | POA: Insufficient documentation

## 2022-10-30 DIAGNOSIS — S93401A Sprain of unspecified ligament of right ankle, initial encounter: Secondary | ICD-10-CM | POA: Insufficient documentation

## 2022-10-30 NOTE — ED Notes (Signed)
Pt reports pain between R knee and foot. Pt can wiggle R toes, R foot warm and appropriate in color, dorsalis pedis pulse 1+, skin dry. Pt denies hitting head.

## 2022-10-30 NOTE — ED Triage Notes (Signed)
Presents via EMS from home s/p fall  States his foot slipped and he caught it between running board and the truck  Having pain to right lower leg and upper back

## 2022-10-30 NOTE — ED Notes (Signed)
Pt was asleep right before switching him from EMS stretcher to hospital stretcher using slideboard. Pt fell asleep again just now.

## 2022-10-30 NOTE — ED Provider Notes (Signed)
   [facility] Provider Note    Event Date/Time   First MD Initiated Contact with Patient 10/30/22 1349     (approximate)   History   Fall (/)   HPI  [name] is a 81 y.o. male with extensive past medical history including atrial fibrillation, hypertension on Eliquis who presents after a fall.  Patient reports pain in his right hip, right knee and right ankle.  This was a mechanical fall.  Denies head injury.  No neck pain, no back pain, no abdominal pain or chest pain.     Physical Exam   Triage Vital Signs: ED Triage Vitals  Enc Vitals Group     BP 10/30/22 1347 123/70     Pulse Rate 10/30/22 1348 78     Resp 10/30/22 1347 19     Temp 10/30/22 1348 97.6 F (36.4 C)     Temp Source 10/30/22 1348 Oral     SpO2 10/30/22 1347 95 %     Weight 10/30/22 1350 109 kg (240 lb 4.8 oz)     Height 10/30/22 1350 1.753 m ('5\' 9"'$ )     Head Circumference --      Peak Flow --      Pain Score 10/30/22 1350 4     Pain Loc --      Pain Edu? --      Excl. in [place]? --     Most recent vital signs: Vitals:   10/30/22 1347 10/30/22 1348  BP: 123/70   Pulse:  78  Resp: 19   Temp:  97.6 F (36.4 C)  SpO2: 95%      General: Awake, no distress.  No head injury noted CV:  Good peripheral perfusion.  No chest wall tenderness palpation Resp:  Normal effort.  Abd:  No distention.  Other:  Right lower extremity: No pain with axial load on the right hip, mild bruising of the anterior knee although flexes without significant discomfort.  Minimal swelling of the right ankle, range of motion is only mildly uncomfortable, doubt fracture.   ED Results / Procedures / Treatments   Labs (all labs ordered are listed, but only abnormal results are displayed) Labs Reviewed - No data to display   EKG     RADIOLOGY Right hip x-ray Right knee x-ray Right ankle x-ray viewed interpreted by me, no fracture   PROCEDURES:  Critical Care performed:    Procedures   MEDICATIONS ORDERED IN ED: Medications - No data to display   IMPRESSION / MDM / [place] / ED COURSE  I reviewed the triage vital signs and the nursing notes. Patient's presentation is most consistent with acute complicated illness / injury requiring diagnostic workup.  Patient presents for mechanical fall with injuries as detailed above.  Differential includes fracture, sprain, contusion  Pending x-rays.  X-rays are reassuring, patient is appropriate for discharge        FINAL CLINICAL IMPRESSION(S) / ED DIAGNOSES   Final diagnoses:  Fall, initial encounter  Contusion of right knee, initial encounter  Sprain of right ankle, unspecified ligament, initial encounter     Rx / DC Orders   ED Discharge Orders     None        Note:  This document was prepared using Dragon voice recognition software and may include unintentional dictation errors.   Lavonia Drafts, MD 11/04/22 [phone]

## 2022-10-30 NOTE — Discharge Instructions (Addendum)
Your x-ray did not show any broken bones.  You can take Tylenol for pain.

## 2022-11-03 ENCOUNTER — Other Ambulatory Visit: Payer: Self-pay

## 2022-11-03 ENCOUNTER — Encounter: Payer: Self-pay | Admitting: Intensive Care

## 2022-11-03 ENCOUNTER — Emergency Department: Payer: No Typology Code available for payment source

## 2022-11-03 ENCOUNTER — Emergency Department
Admission: EM | Admit: 2022-11-03 | Discharge: 2022-11-03 | Disposition: A | Discharge status: Home / Self Care | Payer: No Typology Code available for payment source | Attending: Emergency Medicine | Admitting: Emergency Medicine

## 2022-11-03 DIAGNOSIS — W19XXXA Unspecified fall, initial encounter: Secondary | ICD-10-CM | POA: Diagnosis not present

## 2022-11-03 DIAGNOSIS — S0990XA Unspecified injury of head, initial encounter: Secondary | ICD-10-CM

## 2022-11-03 DIAGNOSIS — S0003XA Contusion of scalp, initial encounter: Secondary | ICD-10-CM | POA: Diagnosis not present

## 2022-11-03 DIAGNOSIS — Z7901 Long term (current) use of anticoagulants: Secondary | ICD-10-CM | POA: Insufficient documentation

## 2022-11-03 NOTE — ED Triage Notes (Signed)
Pt in via EMS from home with c/o fall 1 hour ago and has hematoma to right side of head. Pt takes eloquis. Pt had spinal fusion to neck but refused a c-collar. 140/60

## 2022-11-03 NOTE — Discharge Instructions (Signed)
You were seen in the Emergency Department (ED) today for a head injury.  Based on your evaluation, you may have sustained a concussion (or bruise) to your brain.  If you had a CT scan done, it did not show any evidence of serious injury or bleeding.     Signs of a more serious head injury include vomiting, severe headache, excessive sleepiness or confusion, and weakness or numbness in your face, arms or legs.  Return immediately to the Emergency Department if you experience any of these more concerning symptoms.

## 2022-11-03 NOTE — ED Provider Notes (Signed)
[facility] Provider Note    Event Date/Time   First MD Initiated Contact with Patient 11/03/22 1305     (approximate)   History   Fall   HPI  [name] is a 81 y.o. male who has a history of atrial fibrillation on anticoagulation, chronic pain, previous cervical surgeries, depression, syncope  Today he was at his home.  He was moving from one chair over to another chair and when he moved to the second chair he spun off of it and fell striking his right forehead on the floor.  He reports this happened because these other chair is a spinning chair and as he transferred over its been away from him causing him to fall forward.  He reports that his right hip was slightly sore at the time but has since been able to ambulate to the bathroom and back and its not causing any pain.  His right forehead is swollen.  He did not lose consciousness.  He has not been recently ill other than the fact that he is continue to follow with his doctor for chronic issues of a feeling of stiffness in his right neck and right shoulder but denies any new concerns or neck pains     Physical Exam   Triage Vital Signs: ED Triage Vitals  Enc Vitals Group     BP 11/03/22 1211 134/82     Pulse Rate 11/03/22 1211 71     Resp 11/03/22 1211 18     Temp 11/03/22 1211 98.5 F (36.9 C)     Temp Source 11/03/22 1211 Oral     SpO2 11/03/22 1211 98 %     Weight 11/03/22 1214 255 lb (115.7 kg)     Height 11/03/22 1214 '5\' 9"'$  (1.753 m)     Head Circumference --      Peak Flow --      Pain Score 11/03/22 1213 3     Pain Loc --      Pain Edu? --      Excl. in [place]? --     Most recent vital signs: Vitals:   11/03/22 1211  BP: 134/82  Pulse: 71  Resp: 18  Temp: 98.5 F (36.9 C)  SpO2: 98%     General: Awake, no distress.  Normocephalic atraumatic with exception to a moderate-sized hematoma overlying the right forehead.  He has a very small skin tear overlying it but no laceration  and it is not actively bleeding.  No midline cervical tenderness.  Limited range of motion of the neck as patient reports previous cervical surgeries, but reports no new pain noted discomfort in the neck. CV:  Good peripheral perfusion.  Slightly irregular but otherwise normal heart tones Resp:  Normal effort.  Clear lungs bilaterally.  Speaks in full clear sentences without distress Abd:  No distention.  Other:  Able to stand without difficulty, no pain through range of motion of the legs and arms.  He does have a sort of stiffness about his right arm, and he reports that is a chronic issue related to his previous history of neck problems and has stiffness in his right arm, he has seen "10 specialist" for this in his neck in the past as he describes it.  Nothing new or of concern   ED Results / Procedures / Treatments   Labs (all labs ordered are listed, but only abnormal results are displayed) Labs Reviewed - No data to display  EKG     RADIOLOGY  I personally reviewed the patient's CT scan of the head, and there is an obvious right-sided hematoma but no noted gross intracranial hemorrhage  CT HEAD WO CONTRAST (5MM)  Result Date: 11/03/2022 CLINICAL DATA:  Trauma EXAM: CT HEAD WITHOUT CONTRAST TECHNIQUE: Contiguous axial images were obtained from the base of the skull through the vertex without intravenous contrast. RADIATION DOSE REDUCTION: This exam was performed according to the departmental dose-optimization program which includes automated exposure control, adjustment of the mA and/or kV according to patient size and/or use of iterative reconstruction technique. COMPARISON:  CT Head 08/01/22 FINDINGS: Brain: Streak artifact from the spinal fusion hardware slightly limits assessment of the posterior fossa. Within this limitation-no evidence of hemorrhage. No extra-axial fluid collection. No hydrocephalus. There is advanced generalized volume loss. Vascular: No hyperdense vessel or  unexpected calcification. Skull: There is a large right frontal scalp hematoma. There is no evidence of underlying fracture. Paranasal sinuses are clear. No mastoid. Sinuses/Orbits: No acute finding. Other: None. IMPRESSION: 1. No acute intracranial abnormality. 2. Large right frontal scalp hematoma. No evidence of underlying fracture. Electronically Signed   By: Marin Roberts M.D.   On: 11/03/2022 13:11   DG Hip Unilat  With Pelvis 2-3 Views Right  Result Date: 11/03/2022 CLINICAL DATA:  Trauma, fall EXAM: DG HIP (WITH OR WITHOUT PELVIS) 2-3V RIGHT COMPARISON:  10/30/2022 FINDINGS: No recent fracture or dislocation is seen. Small bony spurs are noted in the medial aspect of right hip. Bony spurs are noted in medial and lateral aspect of left hip. Degenerative changes are noted in visualized lower lumbar spine. Arterial calcifications are seen in soft tissues. IMPRESSION: No fracture or dislocation is seen. No significant interval changes are noted. Degenerative changes are noted with small bony spurs. Lumbar spondylosis. Arteriosclerosis. Electronically Signed   By: Elmer Picker M.D.   On: 11/03/2022 12:46      PROCEDURES:  Critical Care performed: No  Procedures   MEDICATIONS ORDERED IN ED: Medications - No data to display   IMPRESSION / MDM / [place] / ED COURSE  I reviewed the triage vital signs and the nursing notes.                              Differential diagnosis includes, but is not limited to, intracranial hemorrhage, hematoma, skin tear, hip contusion, hip fracture etc.  Given the patient's hip pain has resolved and his imaging negative does not appear to have elevated risk for hip fracture.  Very reassuring examination.  The exception being a large right-sided hematoma, but CT scan thankfully shows no acute intracranial hemorrhage.  He is anticoagulated but is not having any persistent headache no neurologic deficits, he is alert well oriented no distress.   Appears appropriate for discharge with careful outpatient return precautions which he is agreeable with.  Nexus negative for evidence of cervical injury.  History of chronic neck immobility, denies neck injury or new neck pain or discomfort.  No midline cervical tenderness.  Motor neuro intact to his baseline for which he does have peripheral neuropathy  Patient's presentation is most consistent with acute presentation with potential threat to life or bodily function. Etiology of fall is well explained by mechanical issue with the spinning chair that he transferred to.  I do not see evidence for acute medical etiology for his fall.  His work-up here reassuring  Return precautions and treatment recommendations  and follow-up discussed with the patient who is agreeable with the plan.   FINAL CLINICAL IMPRESSION(S) / ED DIAGNOSES   Final diagnoses:  Scalp hematoma, initial encounter  Closed head injury, initial encounter     Rx / DC Orders   ED Discharge Orders     None        Note:  This document was prepared using Dragon voice recognition software and may include unintentional dictation errors.   Delman Kitten, MD 11/03/22 1729

## 2022-11-03 NOTE — ED Notes (Signed)
Called x2

## 2022-11-03 NOTE — ED Triage Notes (Signed)
Patient reports falling and hitting right side head/forehead. Takes eliquis daily. Denies LOC. Reports he fell after he sat on a chair that spins and he lost his balance and hit the floor.

## 2022-11-21 ENCOUNTER — Ambulatory Visit
Admission: RE | Admit: 2022-11-21 | Discharge: 2022-11-21 | Disposition: A | Discharge status: Home / Self Care | Payer: Self-pay | Source: Ambulatory Visit | Attending: Orthopedic Surgery | Admitting: Orthopedic Surgery

## 2022-11-21 ENCOUNTER — Other Ambulatory Visit: Payer: Self-pay

## 2022-11-21 DIAGNOSIS — Z049 Encounter for examination and observation for unspecified reason: Secondary | ICD-10-CM

## 2022-11-21 NOTE — Progress Notes (Signed)
01/30/22: xray lumbar  01/30/22: xray thoracic  08/16/21: xray cervical  06/21/21: MRI cervical  06/12/21: MRI thoracic  06/12/21: xray thoracic and lumbar  09/29/20: CT thoracic  08/03/20: xray cervical     [facility]  08/16/21: xray cervical  09/29/20: CT thoracic

## 2022-11-30 ENCOUNTER — Other Ambulatory Visit: Payer: Self-pay

## 2022-11-30 ENCOUNTER — Emergency Department (HOSPITAL_BASED_OUTPATIENT_CLINIC_OR_DEPARTMENT_OTHER): Payer: No Typology Code available for payment source

## 2022-11-30 ENCOUNTER — Emergency Department (HOSPITAL_BASED_OUTPATIENT_CLINIC_OR_DEPARTMENT_OTHER)
Admission: EM | Admit: 2022-11-30 | Discharge: 2022-11-30 | Disposition: A | Discharge status: Home / Self Care | Payer: No Typology Code available for payment source | Attending: Emergency Medicine | Admitting: Emergency Medicine

## 2022-11-30 ENCOUNTER — Encounter (HOSPITAL_BASED_OUTPATIENT_CLINIC_OR_DEPARTMENT_OTHER): Payer: Self-pay

## 2022-11-30 DIAGNOSIS — S62002A Unspecified fracture of navicular [scaphoid] bone of left wrist, initial encounter for closed fracture: Secondary | ICD-10-CM | POA: Diagnosis not present

## 2022-11-30 DIAGNOSIS — S0083XA Contusion of other part of head, initial encounter: Secondary | ICD-10-CM | POA: Diagnosis not present

## 2022-11-30 DIAGNOSIS — W06XXXA Fall from bed, initial encounter: Secondary | ICD-10-CM | POA: Insufficient documentation

## 2022-11-30 DIAGNOSIS — Z7901 Long term (current) use of anticoagulants: Secondary | ICD-10-CM | POA: Insufficient documentation

## 2022-11-30 DIAGNOSIS — S6992XA Unspecified injury of left wrist, hand and finger(s), initial encounter: Secondary | ICD-10-CM | POA: Diagnosis present

## 2022-11-30 DIAGNOSIS — Z7982 Long term (current) use of aspirin: Secondary | ICD-10-CM | POA: Insufficient documentation

## 2022-11-30 DIAGNOSIS — W19XXXA Unspecified fall, initial encounter: Secondary | ICD-10-CM

## 2022-11-30 MED ORDER — OXYCODONE HCL 5 MG PO TABS
5.0000 mg | ORAL_TABLET | Freq: Once | ORAL | Status: AC
  Administered 2022-11-30: 5 mg via ORAL
  Filled 2022-11-30: qty 1

## 2022-11-30 MED ORDER — OXYCODONE-ACETAMINOPHEN 5-325 MG PO TABS
1.0000 | ORAL_TABLET | Freq: Once | ORAL | Status: AC
  Administered 2022-11-30: 1 via ORAL
  Filled 2022-11-30: qty 1

## 2022-11-30 NOTE — ED Notes (Signed)
RN provided AVS using Teachback Method. Patient verbalizes understanding of Discharge Instructions. Opportunity for Questioning and Answers were provided by RN. Patient Discharged from ED in wheelchair to Home.

## 2022-11-30 NOTE — ED Provider Notes (Signed)
[place] EMERGENCY DEPT Provider Note   CSN: [id number] Arrival date & time: 11/30/22  1105     History  Chief Complaint  Patient presents with   [name] is a 81 y.o. male.  HPI 80 year old male presents with a left wrist injury. He fell out of bed last night and his wrist was pinned up against the dresser. Has some swelling. He is on Eliquis. He thinks he did hit his head but denies headache. No numbness or weakness.   Home Medications Prior to Admission medications   Medication Sig Start Date End Date Taking? Authorizing Provider  apixaban (ELIQUIS) 5 MG TABS tablet Take 1 tablet (5 mg total) by mouth 2 (two) times daily. 10/20/18   Thurnell Lose, MD  ascorbic acid (VITAMIN C) 250 MG tablet Take 1 tablet by mouth 2 (two) times daily. 09/02/19   [provider]  aspirin EC 81 MG tablet Take 81 mg by mouth daily.    [provider]  buPROPion (WELLBUTRIN) 100 MG tablet Take 100 mg by mouth 2 (two) times daily.    [provider]  cyanocobalamin (,VITAMIN B-12,) 1000 MCG/ML injection Inject 1 mL into the muscle every 28 (twenty-eight) days. 03/15/20   [provider]  diclofenac Sodium (VOLTAREN) 1 % GEL Apply 2 g topically 4 (four) times daily. Patient not taking: Reported on 11/04/2021 11/16/19   Harold Hedge, MD  diltiazem (CARDIZEM CD) 120 MG 24 hr capsule Take 1 capsule (120 mg total) by mouth daily. 10/09/19   Hosie Poisson, MD  ferrous sulfate 325 (65 FE) MG tablet Take 325 mg by mouth daily. 03/15/20   [provider]  fluticasone (FLONASE) 50 MCG/ACT nasal spray Place 1 spray into both nostrils daily as needed for allergies or rhinitis.  07/30/18   [provider]  furosemide (LASIX) 20 MG tablet Take 20 mg by mouth daily.    [provider]  furosemide (LASIX) 80 MG tablet Take 1 tablet (80 mg total) by mouth 2 (two) times daily. Patient not taking: Reported on 11/04/2021  11/16/19 11/04/21  Harold Hedge, MD  gabapentin (NEURONTIN) 600 MG tablet Take 600 mg by mouth 3 (three) times daily. 03/15/20   [provider]  methocarbamol (ROBAXIN) 500 MG tablet Take 500 mg by mouth 2 (two) times daily. 08/19/18   [provider]  metoprolol succinate (TOPROL-XL) 25 MG 24 hr tablet Take 25 mg by mouth daily.    [provider]  metoprolol tartrate 75 MG TABS Take 75 mg by mouth 2 (two) times daily. Patient not taking: Reported on 11/04/2021 11/16/19 11/04/21  Harold Hedge, MD  pantoprazole (PROTONIX) 40 MG tablet Take 1 tablet (40 mg total) by mouth 2 (two) times daily. 10/16/18   Thurnell Lose, MD  polyethylene glycol (MIRALAX / GLYCOLAX) packet Take 17 g by mouth daily.    [provider]  potassium chloride (K-DUR,KLOR-CON) 20 MEQ tablet Take 1 tablet (20 mEq total) by mouth daily. Patient taking differently: Take 20 mEq by mouth at bedtime. 09/20/18   Strader, Fransisco Hertz, PA-C  rOPINIRole (REQUIP) 0.25 MG tablet Take 0.25 mg by mouth 2 (two) times daily.  10/09/18   [provider]  simvastatin (ZOCOR) 40 MG tablet Take 40 mg by mouth at bedtime.    [provider]  spironolactone (ALDACTONE) 25 MG tablet Take 25 mg by mouth daily.    [provider]  tamsulosin (  FLOMAX) 0.4 MG CAPS capsule Take 0.4 mg by mouth daily. 09/02/19   [provider]      Allergies    Lorazepam, Duloxetine, Pregabalin, and Zanaflex [tizanidine hcl]    Review of Systems   Review of Systems  Musculoskeletal:  Positive for arthralgias and joint swelling.    Physical Exam Updated Vital Signs BP (!) 146/93 (BP Location: Right Arm)   Pulse 95   Temp 98.3 F (36.8 C) (Oral)   Resp 18   Ht '5\' 9"'$  (1.753 m)   Wt 115.7 kg   SpO2 96%   BMI 37.67 kg/m  Physical Exam Vitals and nursing note reviewed.  Constitutional:      Appearance: He is well-developed.  HENT:     Head: Normocephalic. Contusion present.    Cardiovascular:     Rate and Rhythm: Normal rate.     Pulses:          Radial pulses are 2+ on the left side.  Pulmonary:     Effort: Pulmonary effort is normal.  Musculoskeletal:     Left wrist: Swelling, tenderness and snuff box tenderness present. No deformity. Decreased range of motion.     Left hand: No tenderness.     Comments: Patient has tenderness primarily over snuffbox. Normal grip strength in hand. Grossly normal sensation. Grossly normal radial, median, ulnar nerve testing in left hand  Skin:    General: Skin is warm and dry.  Neurological:     Mental Status: He is alert.     ED Results / Procedures / Treatments   Labs (all labs ordered are listed, but only abnormal results are displayed) Labs Reviewed - No data to display  EKG None  Radiology CT Head Wo Contrast  Result Date: 11/30/2022 CLINICAL DATA:  Fall last night.  Minor head trauma EXAM: CT HEAD WITHOUT CONTRAST TECHNIQUE: Contiguous axial images were obtained from the base of the skull through the vertex without intravenous contrast. RADIATION DOSE REDUCTION: This exam was performed according to the departmental dose-optimization program which includes automated exposure control, adjustment of the mA and/or kV according to patient size and/or use of iterative reconstruction technique. COMPARISON:  11/03/2022 FINDINGS: Brain: No evidence of acute infarction, hemorrhage, hydrocephalus, extra-axial collection or mass lesion/mass effect. Generalized brain atrophy Vascular: No hyperdense vessel or unexpected calcification. Skull: Right anterior scalp swelling without calvarial fracture, improved from 11/03/2022. Cervical occipital fusion with unremarkable hardware where covered. Sinuses/Orbits: No evidence of injury. IMPRESSION: 1. No evidence of intracranial injury. 2. Generalized atrophy. Electronically Signed   By: Jorje Guild M.D.   On: 11/30/2022 12:55   DG Wrist Complete Left  Result Date:  11/30/2022 CLINICAL DATA:  Fall onto left wrist EXAM: LEFT WRIST - COMPLETE 3 VIEW COMPARISON:  None Available. FINDINGS: There are no findings of fracture or dislocation. Degenerative changes, most pronounced at the STT joint. Soft tissue swelling about the wrist. Subcutaneous calcification along the volar aspect of the distal forearm, likely granuloma. IMPRESSION: Soft tissue swelling about the wrist without evidence of fracture or dislocation. Electronically Signed   By: Darrin Nipper M.D.   On: 11/30/2022 11:56    Procedures Procedures    Medications Ordered in ED Medications  oxyCODONE-acetaminophen (PERCOCET/ROXICET) 5-325 MG per tablet 1 tablet (1 tablet Oral Given 11/30/22 1311)  oxyCODONE (Oxy IR/ROXICODONE) immediate release tablet 5 mg (5 mg Oral Given 11/30/22 1311)    ED Course/ Medical Decision Making/ A&P  Medical Decision Making Amount and/or Complexity of Data Reviewed Radiology: ordered.  Risk Prescription drug management.   Patient's wrist x-rays viewed/interpreted by myself.  No fractures.  However he does have point tenderness, especially over the scaphoid, will treat as possible occult scaphoid fracture and splint and a thumb spica.  Otherwise he is on oxycodone for pain and was given a dose here.  He did hit his head though it seems to be minor but he is on Eliquis so a CT head was obtained.  I have also personally viewed/interpreted these images and they are negative.  Will discharge home to follow-up with orthopedics as an outpatient.  Neurovascularly intact.        Final Clinical Impression(s) / ED Diagnoses Final diagnoses:  Fall, initial encounter  Occult closed fracture of scaphoid of left wrist, initial encounter    Rx / DC Orders ED Discharge Orders     None         Sherwood Gambler, MD 11/30/22 1506

## 2022-11-30 NOTE — ED Notes (Signed)
MD at the Bedside. 

## 2022-11-30 NOTE — ED Triage Notes (Signed)
Patient here POV from Home.  Endorses rolling OOB this Last PM and falling onto Left Wrist. Obvious Swelling.  No Head Injury. No LOC. Does take Eliquis.   NAD Noted during Triage. A&Ox4. GCS 15. BIB Wheelchair.

## 2022-11-30 NOTE — Discharge Instructions (Signed)
Your x-ray does not show a fracture but your presentation today is concerning for a fracture that can often be missed on the initial x-ray.  Thus you are being put in a splint.  You need to have repeat x-rays and follow-up with hand surgery.  You are being given a referral, call for an appointment next week.  Take Tylenol for pain and you can ice the injury as well.

## 2022-11-30 NOTE — ED Notes (Signed)
XR at the Bedside.

## 2022-11-30 NOTE — ED Notes (Signed)
Patient transported to Radiology 

## 2022-12-05 NOTE — Progress Notes (Addendum)
Referring Physician:  Delman Kitten, MD [place],  [place] [address]  Primary Physician:  Hayden Rasmussen, MD  History of Present Illness: 12/06/2022 [name] has a history of afib, chronic pain, depression, HTN, CHF, h/o GI bleeding, s/p aortic valve replacement, and CVA.   History of cervical fusion from occiput to T1 about 10-12 years ago by Dr Carloyn Manner.   He has seen multiple providers for his neck pain since his surgery. Last seen by PMR at [facility].   Seen in ED on 11/03/22 s/p fall. Referred here for his chronic neck pain.   About 7-8 months ago, he stepped off a curb and had immediate pain in the middle of his back that radiates to his neck and his head. He also has sharp pain between his shoulder blades. He feels like his jaw is tight. This is worse than his typical chronic pain. No radicular arm pain. He has some numbness in left hand. No new weakness in his arms.    He is on ELIQUIS. He sees [facility]. He is on percocet 10 and gabapentin.   Conservative measures:  Physical therapy: did not help in the past.   Multimodal medical therapy including regular antiinflammatories:  robaxin, percocet Injections:  has not received epidural steroid injections for this episode  Past Surgery: cervical fusion with Dr Carloyn Manner about 10 years ago; thoracic surgery with Dr Redmond Pulling about 3 years ago  [name] has no symptoms of cervical myelopathy.  The symptoms are causing a significant impact on the patient's life.   Review of Systems:  A 10 point review of systems is negative, except for the pertinent positives and negatives detailed in the HPI.  Past Medical History: Past Medical History:  Diagnosis Date   Cancer [facility])    Carpal tunnel syndrome    Cervical spondylosis with myelopathy    Chronic pain syndrome    Depression    Difficult intubation    needs smaller tube; awake oral fiberoptic scope 8.0 ETT 03/30/08   Dysphagia, pharyngeal phase     Hemorrhage of gastrointestinal tract, unspecified    History of cardiac cath    a. 03/2018 Cath: LAD and LCX with mild luminal irregularities.  No significant disease.   Iron deficiency anemia, unspecified    Lumbosacral spondylosis without myelopathy    Muscle weakness (generalized)    Other and unspecified disc disorder of cervical region    Other and unspecified hyperlipidemia    Patent foramen ovale    a. 05/2018 Echo: post-op TAVR--> + bublble study w/ L->R shunt.   Permanent atrial fibrillation ([place])    a.  Diagnosed in the spring 2019.  Had rapid atrial fibrillation following TAVR and has been rate controlled with beta-blocker.  CHA2DS2VASc equals 7.  Supposed to be on Eliquis.   Primary localized osteoarthrosis, lower leg    Primary localized osteoarthrosis, shoulder region    Reflux esophagitis    Severe aortic stenosis    a. mild-mod AS by 01/2016 TTE, restricted AV opening with mod AR 01/2016 TEE; b. 10/2017 Echo: EF 60-65%, sev Ca2+ AoV, mean grad 16mHg; c. 05/2018 s/p TAVR ([facility]); d. 06/2018 Echo: EF 55-60%, well-seated prosth AoV, peak velocity 156cm/s. AoV gradient 523mg.   Spinal stenosis, unspecified region other than cervical    Stroke ([facility]   01/2016   Syncope and collapse    Unspecified arthropathy, lower leg    Unspecified constipation    Unspecified essential hypertension  Unspecified glaucoma(365.9)     Past Surgical History: Past Surgical History:  Procedure Laterality Date   BILATERAL CATARACT SURGERY  2009   DR GROAT    BIOPSY  10/14/2018   Procedure: BIOPSY;  Surgeon: Wonda Horner, MD;  Location: [facility];  Service: Endoscopy;;   CARDIAC VALVE REPLACEMENT  05/2018   CERVIACAL SPINE (4X)     DR MARK ROY    COLONOSCOPY  2007   DR HENSEL    COLONOSCOPY WITH PROPOFOL N/A 11/20/2017   Procedure: COLONOSCOPY WITH PROPOFOL;  Surgeon: Wilford Corner, MD;  Location: [facility];  Service: Endoscopy;  Laterality: N/A;   ESOPHAGOGASTRODUODENOSCOPY (EGD)  WITH PROPOFOL N/A 10/14/2018   Procedure: ESOPHAGOGASTRODUODENOSCOPY (EGD) WITH PROPOFOL;  Surgeon: Wonda Horner, MD;  Location: [facility] ENDOSCOPY;  Service: Endoscopy;  Laterality: N/A;   JOINT REPLACEMENT     both knees   LEFT KNEE REPLACEMENT     DR ALUSIO   LEFT TRANSVERSE CARPAL LIGAMENT  01/06/2008   ROTATOR CUFF LEFT SHOULDER  2001   DR MURPHY    SHOULDER OPEN ROTATOR CUFF REPAIR  2006   DR [facility]   SPINE SURGERY     neck fusion    Allergies: Allergies as of 12/06/2022 - Review Complete 12/06/2022  Allergen Reaction Noted   Lorazepam Other (See Comments) 10/14/2018   Duloxetine Other (See Comments) 02/12/2017   Pregabalin Other (See Comments) and Swelling 10/06/2015   Zanaflex [tizanidine hcl] Anxiety 07/03/2012    Medications: Outpatient Encounter Medications as of 12/06/2022  Medication Sig   apixaban (ELIQUIS) 5 MG TABS tablet Take 1 tablet (5 mg total) by mouth 2 (two) times daily.   ascorbic acid (VITAMIN C) 250 MG tablet Take 1 tablet by mouth 2 (two) times daily.   aspirin EC 81 MG tablet Take 81 mg by mouth daily.   buPROPion (WELLBUTRIN) 100 MG tablet Take 100 mg by mouth 2 (two) times daily.   cyanocobalamin (,VITAMIN B-12,) 1000 MCG/ML injection Inject 1 mL into the muscle every 28 (twenty-eight) days.   diltiazem (CARDIZEM CD) 120 MG 24 hr capsule Take 1 capsule (120 mg total) by mouth daily.   ferrous sulfate 325 (65 FE) MG tablet Take 325 mg by mouth daily.   fluticasone (FLONASE) 50 MCG/ACT nasal spray Place 1 spray into both nostrils daily as needed for allergies or rhinitis.    furosemide (LASIX) 20 MG tablet Take 20 mg by mouth daily.   gabapentin (NEURONTIN) 600 MG tablet Take 600 mg by mouth 3 (three) times daily.   metoprolol succinate (TOPROL-XL) 25 MG 24 hr tablet Take 25 mg by mouth daily.   oxyCODONE-acetaminophen (PERCOCET) 10-325 MG tablet Take 1 tablet by mouth every 6 (six) hours as needed.   pantoprazole (PROTONIX) 40 MG tablet Take 1 tablet  (40 mg total) by mouth 2 (two) times daily.   polyethylene glycol (MIRALAX / GLYCOLAX) packet Take 17 g by mouth daily.   potassium chloride (K-DUR,KLOR-CON) 20 MEQ tablet Take 1 tablet (20 mEq total) by mouth daily. (Patient taking differently: Take 20 mEq by mouth at bedtime.)   rOPINIRole (REQUIP) 0.25 MG tablet Take 0.25 mg by mouth 2 (two) times daily.    simvastatin (ZOCOR) 40 MG tablet Take 40 mg by mouth at bedtime.   spironolactone (ALDACTONE) 25 MG tablet Take 25 mg by mouth daily.   tamsulosin (FLOMAX) 0.4 MG CAPS capsule Take 0.4 mg by mouth daily.   [DISCONTINUED] diclofenac Sodium (VOLTAREN) 1 % GEL Apply 2 g topically  4 (four) times daily. (Patient not taking: Reported on 11/04/2021)   [DISCONTINUED] furosemide (LASIX) 80 MG tablet Take 1 tablet (80 mg total) by mouth 2 (two) times daily. (Patient not taking: Reported on 11/04/2021)   [DISCONTINUED] methocarbamol (ROBAXIN) 500 MG tablet Take 500 mg by mouth 2 (two) times daily.   [DISCONTINUED] metoprolol tartrate 75 MG TABS Take 75 mg by mouth 2 (two) times daily. (Patient not taking: Reported on 11/04/2021)   No facility-administered encounter medications on file as of 12/06/2022.    Social History: Social History   Tobacco Use   Smoking status: Former    Types: Cigarettes    Quit date: 08/29/1985    Years since quitting: 37.2   Smokeless tobacco: Former    Quit date: 02/12/1985  Vaping Use   Vaping Use: Never used  Substance Use Topics   Alcohol use: No   Drug use: No    Family Medical History: Family History  Problem Relation Age of Onset   Cancer Mother 36       cervical cancer   Cancer Father 39       lung  cancer    Physical Examination: Vitals:   12/06/22 0855  BP: (!) 132/92  Pulse: 81    General: Patient is well developed, well nourished, calm, collected, and in no apparent distress. Attention to examination is appropriate.  Respiratory: Patient is breathing without any  difficulty.   NEUROLOGICAL:     Awake, alert, oriented to person, place, and time.  Speech is clear and fluent. Fund of knowledge is appropriate.   Cranial Nerves: Pupils equal round and reactive to light.  Facial tone is symmetric.  Facial sensation is symmetric.  Very limited ROM of cervical spine.   Well healed cervical and thoracic incisions. No tenderness over his incisions.   No scapular tenderness, but states this is where his pain is located.   No abnormal lesions on exposed skin.   Strength: Side Biceps Triceps Deltoid Interossei Grip Wrist Ext. Wrist Flex.  R '5 5 5 5 5 5 5  '$ L '5 5 5 5 5 5 5   '$ Side Iliopsoas Quads Hamstring PF DF EHL  R '5 5 5 5 5 5  '$ L '5 5 5 5 5 5   '$ Reflexes are 2+ and symmetric at the biceps, triceps, brachioradialis, patella and achilles.   Hoffman's is positive on right, negative hoffman's on left.  Clonus is not present.   Bilateral upper and lower extremity sensation is intact to light touch.     He has slow gait. Ambulates with a cane.   Medical Decision Making  Imaging: Thoracic and lumbar xrays dated 01/30/22:  1.  No acute fracture or traumatic malalignment.  2.  Diffuse thoracic and lumbar dextrocurvature and cervical levocurvature.  3.  Post surgical changes of suboccipital to C4 as well as C4-T1 posterior rod fixation of the cervical spine.  4.  ACDF of C6-C7.  5.  Posterior fusion and rod screw fixation at T10-T11.  6.  Extensive thoracic and lumbar ankylosis concerning for DISH.  7.  Multilevel moderate degenerative disease.   CT of cervical spine dated 08/01/22:  FINDINGS: Alignment: Minimal anterolisthesis at C2-C3. Prominent anterolisthesis at C6-C7 approximately 8 mm unchanged.   Skull base and vertebrae: Anterior plate and screws M4-B5. Posterior cervical fusion extending from occiput to T1. Fusion of odontoid process with anterior arch of C1 and skull base. Diffuse facet joint effusions. Orthopedic hardware appears intact.  Bones demineralized. No  definite acute fracture, additional subluxation, or bone destruction. Degenerative disc disease changes upper thoracic spine.   Soft tissues and spinal canal: Prevertebral soft tissues normal thickness. Prominent atherosclerotic calcifications at carotid bifurcations bilaterally. Multiple streak artifacts traverse cervical spinal canal.   Disc levels:  No specific abnormalities   Upper chest: Lung apices clear   Other: N/A   IMPRESSION: Atrophy with small vessel chronic ischemic changes of deep cerebral white matter.   Question old medial RIGHT cerebellar infarct.   No acute intracranial abnormalities.   Postsurgical changes of posterior occipitocervical fusion through T1.   Degenerative disc and facet disease changes cervical spine with chronic anterolisthesis at C6-C7.   No acute cervical spine abnormalities.     Electronically Signed   By: Lavonia Dana M.D.   On: 08/01/2022 13:20      I have personally reviewed the images and agree with the above interpretation.  Assessment and Plan: Mr. Dirosa is a pleasant 81 y.o. male with cervical fusion with Dr Carloyn Manner about 10 years ago and thoracic surgery with Dr Redmond Pulling about 3 years ago.   He's had chronic pain since his surgeries. About 7-8 months ago, he stepped off a curb and had immediate pain in the middle of his back that radiates to his neck and his head. He also has sharp pain between his shoulder blades. He feels like his jaw is tight. This is worse than his typical chronic pain.   Previous imaging shows cervical fusion is healed. Also has fusion at T1-T11 along with diffuse spondylosis and DDD. This imaging was done prior to his increased pain.   Treatment options discussed with patient and following plan made:   - Xrays of cervical and thoracic spine ordered.  - Continue to follow with [facility]- they are doing medication management. Do not do injections.  - Will set up phone  review once I get his xrays back. Depending on results, may refer to PMR at [facility] for possible trigger point injections.   I spent a total of 30 minutes in face-to-face and non-face-to-face activities related to this patient's care today including review of outside records, review of imaging, review of symptoms, physical exam, discussion of differential diagnosis, discussion of treatment options, and documentation.   Thank you for involving me in the care of this patient.   Geronimo Boot PA-C Dept. of Neurosurgery

## 2022-12-06 ENCOUNTER — Ambulatory Visit (INDEPENDENT_AMBULATORY_CARE_PROVIDER_SITE_OTHER): Payer: Medicare Other | Admitting: Orthopedic Surgery

## 2022-12-06 ENCOUNTER — Ambulatory Visit
Admission: RE | Admit: 2022-12-06 | Discharge: 2022-12-06 | Disposition: A | Discharge status: Home / Self Care | Payer: Medicare Other | Source: Ambulatory Visit | Attending: Orthopedic Surgery | Admitting: Orthopedic Surgery

## 2022-12-06 ENCOUNTER — Encounter: Payer: Self-pay | Admitting: Orthopedic Surgery

## 2022-12-06 ENCOUNTER — Ambulatory Visit
Admission: RE | Admit: 2022-12-06 | Discharge: 2022-12-06 | Disposition: A | Discharge status: Home / Self Care | Payer: Medicare Other | Attending: Orthopedic Surgery | Admitting: Orthopedic Surgery

## 2022-12-06 VITALS — BP 132/92 | HR 81 | Ht 69.0 in | Wt 233.4 lb

## 2022-12-06 DIAGNOSIS — M7918 Myalgia, other site: Secondary | ICD-10-CM

## 2022-12-06 DIAGNOSIS — M546 Pain in thoracic spine: Secondary | ICD-10-CM

## 2022-12-06 DIAGNOSIS — Z981 Arthrodesis status: Secondary | ICD-10-CM | POA: Insufficient documentation

## 2022-12-06 DIAGNOSIS — M4322 Fusion of spine, cervical region: Secondary | ICD-10-CM | POA: Diagnosis present

## 2022-12-06 NOTE — Patient Instructions (Signed)
It was so nice to see you today, I am sorry that you are hurting so much.   I want to get some updated xrays of your neck and mid back.   You can go across [place] to [facility] for your xrays. You do not need an appointment. The orders are in the system.   Once I get your xrays back, we will set up a phone visit to discuss them.   Please do not hesitate to call if you have any questions or concerns. You can also message me in [place].   If you have not heard back about any of the tests/procedures in the next week, please call the office so we can help you get these things scheduled.   Geronimo Boot PA-C [phone]

## 2022-12-13 ENCOUNTER — Other Ambulatory Visit: Payer: Self-pay | Admitting: Family Medicine

## 2022-12-13 ENCOUNTER — Ambulatory Visit
Admission: RE | Admit: 2022-12-13 | Discharge: 2022-12-13 | Disposition: A | Discharge status: Home / Self Care | Payer: Medicare Other | Source: Ambulatory Visit | Attending: Family Medicine | Admitting: Family Medicine

## 2022-12-13 DIAGNOSIS — S2241XA Multiple fractures of ribs, right side, initial encounter for closed fracture: Secondary | ICD-10-CM

## 2022-12-18 NOTE — Progress Notes (Unsigned)
Telephone Visit- Progress Note: Referring Physician:  No referring provider defined for this encounter.  Primary Physician:  Brett Rasmussen, MD  This visit was performed via telephone.  Patient location: home Provider location: office  I spent a total of 15 minutes non-face-to-face activities for this visit on the date of this encounter including review of current clinical condition and response to treatment.    Patient has given verbal consent to this telephone visits and we reviewed the limitations of a telephone visit. Patient wishes to proceed.      Chief Complaint:  review xray results  History of Present Illness: [name] has a history of afib, chronic pain, depression, HTN, CHF, h/o GI bleeding, s/p aortic valve replacement, and CVA.    History of cervical fusion from occiput to T1 about 10-12 years ago by Dr [name].   Last seen by me on 12/06/22 for neck pain, pain between his shoulder blades, and tightness in his jaw. He continues with this pain.    He is on ELIQUIS. He sees [facility]. He is on percocet 10 and gabapentin.    Conservative measures:  Physical therapy: did not help in the past.   Multimodal medical therapy including regular antiinflammatories:  robaxin, percocet Injections:  has not received epidural steroid injections for this episode   Past Surgery: cervical fusion with Dr [name] about 10 years ago; thoracic surgery with Dr [name] about 3 years ago   [name] has no symptoms of cervical myelopathy.  Exam: No exam done as this was a telephone encounter.     Imaging: Xrays of cervical spine dated 12/09/22:  FINDINGS: Chronic craniocervical junction and multilevel cervical ankylosis with chronic suboccipital through C3-C4 posterior laminar fusion hardware. Superimposed additional discontinuous C4-C5 through T1 posterior laminar hardware and C6-C7 ACDF. Hardware appearance and configuration stable since 08/03/2020  radiographs. And evidence of solid ankylosis/arthrodesis throughout on the August CT. Stable vertebral height and alignment. Normal prevertebral soft tissue contour. No acute osseous abnormality identified. Superimposed calcified cervical carotid atherosclerosis. Partially visible lung apices are clear.   IMPRESSION: 1. No acute osseous abnormality identified in the cervical spine. 2. Chronic craniocervical through cervicothoracic junction hardware fusion. Stable hardware appearance and solid ankylosis/arthrodesis throughout demonstrated by CT in August.     Electronically Signed   By: [name] M.D.   On: 12/09/2022 11:12  Xrays of thoracic spine dated 12/09/22:  FINDINGS: Cervical through cervicothoracic junction fusion described on the cervical spine radiographs today. Superimposed chronic T10-T11 posterior thoracic fusion hardware also appears stable and intact. Bulky chronic thoracic disc and endplate degeneration elsewhere. Vertebral height and alignment appears stable. Grossly intact posterior ribs. No acute osseous abnormality identified.   Stable visible chest including TAVR.  Negative visible bowel gas.   IMPRESSION: 1. No acute osseous abnormality identified in the thoracic spine. 2. Cervicothoracic junction fusion described on the dedicated cervical spine series. Superimposedchronic T10-T11 posterior fusion appears stable. Advanced thoracic disc and endplate degeneration elsewhere.     Electronically Signed   By: [name] M.D.   On: 12/09/2022 11:16  I have personally reviewed the images and agree with the above interpretation.  Imaging reviewed with Dr. Izora Travis as well prior to his visit.  It looks like he had an old occiput to C4 fusion performed before 2009 in addition to his C6-7 anterior and posterior operation.  in 2009, it looks like the rod at C3-C4 was cut and he had a C4-T1 posterior fusion.  It was not connected in 2009 on his postoperative CT on March 30, 2008. He has 2 separate constructs. His cervical spine is solidly fused.    Assessment and Plan: [name] is a pleasant 82 y.o. male with neck pain, pain between his shoulder blades, and tightness in his jaw.   History of cervical fusion from occiput to T1 about 10-12 years ago by Dr [name] and he also had thoracic surgery with Dr [name] about 3 years ago  Above imaging shows that cervical spine is solidly fused. Known fusion at T10-T11 as well with diffuse thoracic spondylosis.   Treatment options discussed with patient and following plan made:   - Reviewed with Dr. Izora Travis as above. No further surgery recommended.  - Recommend referral to PMR to consider possible injections. He currently sees [facility] but they only do medical management.  - Patient agrees with referral to PMR and he would like to be seen at [facility]. They will call him with an appointment.  - Follow up with me prn.   Brett Boot PA-C Neurosurgery

## 2022-12-19 ENCOUNTER — Ambulatory Visit (INDEPENDENT_AMBULATORY_CARE_PROVIDER_SITE_OTHER): Payer: Medicare Other | Admitting: Orthopedic Surgery

## 2022-12-19 ENCOUNTER — Encounter: Payer: Self-pay | Admitting: Orthopedic Surgery

## 2022-12-19 DIAGNOSIS — Z981 Arthrodesis status: Secondary | ICD-10-CM | POA: Diagnosis not present

## 2022-12-19 DIAGNOSIS — M4322 Fusion of spine, cervical region: Secondary | ICD-10-CM | POA: Diagnosis not present

## 2022-12-19 DIAGNOSIS — M7918 Myalgia, other site: Secondary | ICD-10-CM | POA: Diagnosis not present

## 2023-02-19 ENCOUNTER — Other Ambulatory Visit: Payer: Self-pay | Admitting: Physical Medicine & Rehabilitation

## 2023-02-19 DIAGNOSIS — G8929 Other chronic pain: Secondary | ICD-10-CM

## 2023-02-19 DIAGNOSIS — M546 Pain in thoracic spine: Secondary | ICD-10-CM

## 2023-02-26 ENCOUNTER — Emergency Department: Payer: No Typology Code available for payment source

## 2023-02-26 ENCOUNTER — Emergency Department
Admission: EM | Admit: 2023-02-26 | Discharge: 2023-02-27 | Disposition: A | Discharge status: Home / Self Care | Payer: No Typology Code available for payment source | Attending: Emergency Medicine | Admitting: Emergency Medicine

## 2023-02-26 ENCOUNTER — Other Ambulatory Visit: Payer: Self-pay

## 2023-02-26 DIAGNOSIS — S0181XA Laceration without foreign body of other part of head, initial encounter: Secondary | ICD-10-CM | POA: Diagnosis not present

## 2023-02-26 DIAGNOSIS — Z8673 Personal history of transient ischemic attack (TIA), and cerebral infarction without residual deficits: Secondary | ICD-10-CM | POA: Insufficient documentation

## 2023-02-26 DIAGNOSIS — I110 Hypertensive heart disease with heart failure: Secondary | ICD-10-CM | POA: Diagnosis not present

## 2023-02-26 DIAGNOSIS — I5032 Chronic diastolic (congestive) heart failure: Secondary | ICD-10-CM | POA: Insufficient documentation

## 2023-02-26 DIAGNOSIS — I4891 Unspecified atrial fibrillation: Secondary | ICD-10-CM | POA: Insufficient documentation

## 2023-02-26 DIAGNOSIS — R519 Headache, unspecified: Secondary | ICD-10-CM | POA: Diagnosis present

## 2023-02-26 DIAGNOSIS — Z7901 Long term (current) use of anticoagulants: Secondary | ICD-10-CM | POA: Insufficient documentation

## 2023-02-26 DIAGNOSIS — Z96653 Presence of artificial knee joint, bilateral: Secondary | ICD-10-CM | POA: Diagnosis not present

## 2023-02-26 DIAGNOSIS — W01198A Fall on same level from slipping, tripping and stumbling with subsequent striking against other object, initial encounter: Secondary | ICD-10-CM | POA: Insufficient documentation

## 2023-02-26 DIAGNOSIS — W19XXXA Unspecified fall, initial encounter: Secondary | ICD-10-CM

## 2023-02-26 LAB — BASIC METABOLIC PANEL
Anion gap: 8 (ref 5–15)
BUN: 36 mg/dL — ABNORMAL HIGH (ref 8–23)
CO2: 25 mmol/L (ref 22–32)
Calcium: 9.2 mg/dL (ref 8.9–10.3)
Chloride: 103 mmol/L (ref 98–111)
Creatinine, Ser: 1.4 mg/dL — ABNORMAL HIGH (ref 0.61–1.24)
GFR, Estimated: 51 mL/min — ABNORMAL LOW (ref >60)
Glucose, Bld: 105 mg/dL — ABNORMAL HIGH (ref 70–99)
Potassium: 4.6 mmol/L (ref 3.5–5.1)
Sodium: 136 mmol/L (ref 135–145)

## 2023-02-26 LAB — CBC
HCT: 42.4 % (ref 39.0–52.0)
Hemoglobin: 13.6 g/dL (ref 13.0–17.0)
MCH: 30.9 pg (ref 26.0–34.0)
MCHC: 32.1 g/dL (ref 30.0–36.0)
MCV: 96.4 fL (ref 80.0–100.0)
Platelets: 181 10*3/uL (ref 150–400)
RBC: 4.4 MIL/uL (ref 4.22–5.81)
RDW: 14.8 % (ref 11.5–15.5)
WBC: 9.2 10*3/uL (ref 4.0–10.5)
nRBC: 0 % (ref 0.0–0.2)

## 2023-02-26 LAB — TROPONIN I (HIGH SENSITIVITY): Troponin I (High Sensitivity): 8 ng/L (ref <18)

## 2023-02-26 MED ORDER — ACETAMINOPHEN 500 MG PO TABS: 1000.0000 mg | ORAL_TABLET | Freq: Once | ORAL | Status: DC

## 2023-02-26 MED ORDER — LIDOCAINE-EPINEPHRINE 2 %-1:100000 IJ SOLN
20.0000 mL | Freq: Once | INTRAMUSCULAR | Status: AC
  Administered 2023-02-27: 20 mL
  Filled 2023-02-26: qty 1

## 2023-02-26 NOTE — ED Triage Notes (Addendum)
Pt to ED via POV c/o head injury and chest pain. Pt fell and hit his head on the floor, pt has laceration to forehead, bleeding under control wrapped with gauze. Pt takes eliquis. Pt started complaining of right sided chest pain during triage. Pt uncomfortable during triage. Pt complaining of leg pain and lower back pain but has been going on for a while and has MRI scheduled.

## 2023-02-26 NOTE — ED Provider Notes (Incomplete)
[facility] Provider Note    Event Date/Time   First MD Initiated Contact with Patient 02/26/23 2320     (approximate)   History   Chest Pain and Head Injury   HPI  [name] is a 82 y.o. male past medical history of atrial fibrillation on Eliquis, chronic pain depression hypertension CHF status post aortic valve replacement who presents after a fall.  Patient tells me that this evening he took a pill that he was given to help relax it makes you sleepy.  He got up and walked to the den and then slipped on the tile fell forward hitting his head and chest on the ground.  He did not lose consciousness.  Was able to get up on his own.  Complains of pain in his head chest and the right hip.  He has chronic pain in the back and right hip and is scheduled for MRIs later this month.  Says the hip pain is now worse.  He also complains of pain in the chest denies dyspnea.     Past Medical History:  Diagnosis Date   Cancer [facility])    Carpal tunnel syndrome    Cervical spondylosis with myelopathy    Chronic pain syndrome    Depression    Difficult intubation    needs smaller tube; awake oral fiberoptic scope 8.0 ETT 03/30/08   Dysphagia, pharyngeal phase    Hemorrhage of gastrointestinal tract, unspecified    History of cardiac cath    a. 03/2018 Cath: LAD and LCX with mild luminal irregularities.  No significant disease.   Iron deficiency anemia, unspecified    Lumbosacral spondylosis without myelopathy    Muscle weakness (generalized)    Other and unspecified disc disorder of cervical region    Other and unspecified hyperlipidemia    Patent foramen ovale    a. 05/2018 Echo: post-op TAVR--> + bublble study w/ L->R shunt.   Permanent atrial fibrillation ([place])    a.  Diagnosed in the spring 2019.  Had rapid atrial fibrillation following TAVR and has been rate controlled with beta-blocker.  CHA2DS2VASc equals 7.  Supposed to be on Eliquis.   Primary localized  osteoarthrosis, lower leg    Primary localized osteoarthrosis, shoulder region    Reflux esophagitis    Severe aortic stenosis    a. mild-mod AS by 01/2016 TTE, restricted AV opening with mod AR 01/2016 TEE; b. 10/2017 Echo: EF 60-65%, sev Ca2+ AoV, mean grad 39mHg; c. 05/2018 s/p TAVR ([facility]); d. 06/2018 Echo: EF 55-60%, well-seated prosth AoV, peak velocity 156cm/s. AoV gradient 535mg.   Spinal stenosis, unspecified region other than cervical    Stroke ([facility]   01/2016   Syncope and collapse    Unspecified arthropathy, lower leg    Unspecified constipation    Unspecified essential hypertension    Unspecified glaucoma(365.9)     Patient Active Problem List   Diagnosis Date Noted   Hypotension 11/04/2021   Laceration of forehead 11/04/2021   Chronic diastolic CHF (congestive heart failure) ([facility]11/19/2022   Renal cyst 11/04/2021   Hyperkalemia 11/04/2021   Fall 11/03/2021   Palliative care by specialist    Goals of care, counseling/discussion    UTI (urinary tract infection) 11/10/2019   AF (paroxysmal atrial fibrillation) ([facility] [place]/24/2020   Acute blood loss anemia 11/10/2019   Cellulitis 11/10/2019   AKI (acute kidney injury) ([facility] [place]11/24/2020   History of CVA (cerebrovascular accident) 11/10/2019   Abdominal pain 10/07/2019  Pressure injury of skin 10/07/2019   Atrial fibrillation with rapid ventricular response ([place]) 10/06/2019   Acute encephalopathy    GI bleeding A999333   Acute metabolic encephalopathy A999333   Acute renal failure (ARF) ([place]) 10/13/2018   S/P TAVR (transcatheter aortic valve replacement) 09/20/2018   Permanent atrial fibrillation ([place]) 09/20/2018   Acute on chronic diastolic CHF (congestive heart failure) ([place]) 09/18/2018   Heme positive stool 11/20/2017   Gait disorder 03/24/2013   Cervical post-laminectomy syndrome 03/26/2012   Depression 03/26/2012   Degenerative arthritis of shoulder region 03/26/2012   KNEE REPLACEMENT, BILATERAL, HX OF  06/04/2007   CELLULITIS/ABSCESS, ARM 05/05/2007   Anxiety state 04/08/2007   PAIN, CHRONIC NEC 04/08/2007   OSTEOARTHROSIS, GENERALIZED, MULTIPLE SITES 04/08/2007   SPONDYLOLISTHESIS 04/08/2007   INCONTINENCE, URGE 04/08/2007   HYPERCHOLESTEROLEMIA 03/07/2007   HYPERTENSION, BENIGN 03/07/2007     Physical Exam  Triage Vital Signs: ED Triage Vitals  Enc Vitals Group     BP 02/26/23 2046 129/69     Pulse Rate 02/26/23 2046 93     Resp 02/26/23 2046 20     Temp 02/26/23 2046 98 F (36.7 C)     Temp Source 02/26/23 2046 Oral     SpO2 02/26/23 2046 95 %     Weight 02/26/23 2047 255 lb (115.7 kg)     Height 02/26/23 2047 '5\' 9"'$  (1.753 m)     Head Circumference --      Peak Flow --      Pain Score 02/26/23 2102 10     Pain Loc --      Pain Edu? --      Excl. in [place]? --     Most recent vital signs: Vitals:   02/27/23 0000 02/27/23 0117  BP: 128/88   Pulse: 87   Resp: 18   Temp:  98.1 F (36.7 C)  SpO2: 96%      General: Awake, no distress.  CV:  Good peripheral perfusion.  Resp:  Normal effort.  Lung sounds are clear Abd:  No distention.  Abdomen is soft nontender throughout Neuro:             Awake, Alert, Oriented x 3  Other:  No significant tenderness over the anterior chest wall or sternum, no crepitus There is an irregular shaped laceration over the right side of the forehead with dried blood on the forehead Pupils are pinpoint  ED Results / Procedures / Treatments  Labs (all labs ordered are listed, but only abnormal results are displayed) Labs Reviewed  BASIC METABOLIC PANEL - Abnormal; Notable for the following components:      Result Value   Glucose, Bld 105 (*)    BUN 36 (*)    Creatinine, Ser 1.40 (*)    GFR, Estimated 51 (*)    All other components within normal limits  CBC  TROPONIN I (HIGH SENSITIVITY)  TROPONIN I (HIGH SENSITIVITY)     EKG  EKG shows atrial fibrillation with RVR, normal axis normal intervals no acute ischemic  changes   RADIOLOGY I reviewed and interpreted the CT scan of the brain which does not show any acute intracranial process    PROCEDURES:  Critical Care performed: No  .1-3 Lead EKG Interpretation  Performed by: Rada Hay, MD Authorized by: Rada Hay, MD     Interpretation: abnormal     ECG rate assessment: tachycardic     Rhythm: atrial fibrillation     Ectopy: none  Conduction: normal   ..Laceration Repair  Date/Time: 02/27/2023 2:06 AM  Performed by: Rada Hay, MD Authorized by: Rada Hay, MD   Consent:    Consent obtained:  Verbal   Risks discussed:  Pain, infection and poor wound healing Universal protocol:    Patient identity confirmed:  Verbally with patient Laceration details:    Location:  Face   Face location:  Forehead   Length (cm):  4 Exploration:    Limited defect created (wound extended): no     Contaminated: no   Treatment:    Area cleansed with:  Saline   Amount of cleaning:  Standard   Irrigation solution:  Sterile saline   Irrigation method:  Syringe   Visualized foreign bodies/material removed: no     Debridement:  None   Undermining:  None Skin repair:    Repair method:  Sutures   Suture size:  5-0   Suture material:  Nylon   Suture technique:  Simple interrupted   Number of sutures:  4 Approximation:    Approximation:  Close Repair type:    Repair type:  Simple Post-procedure details:    Dressing:  Open (no dressing)   Procedure completion:  Tolerated   The patient is on the cardiac monitor to evaluate for evidence of arrhythmia and/or significant heart rate changes.   MEDICATIONS ORDERED IN ED: Medications  acetaminophen (TYLENOL) tablet 1,000 mg (0 mg Oral Hold 02/27/23 0025)  lidocaine-EPINEPHrine (XYLOCAINE W/EPI) 2 %-1:100000 (with pres) injection 20 mL (20 mLs Infiltration Given by Other 02/27/23 0005)     IMPRESSION / MDM / ASSESSMENT AND PLAN / ED COURSE  I reviewed the triage  vital signs and the nursing notes.                              Patient's presentation is most consistent with acute presentation with potential threat to life or bodily function.  Differential diagnosis includes, but is not limited to, intracranial hemorrhage, skull fracture  The patient is an 82 year old male who presents after a fall.  He tells me that he took some medication that he was recently prescribed to relax and then he walked to his den when he tripped and fell hitting his head.  Did not lose consciousness was able to get up with assistance.  He complains of both head pain chest pain and right hip pain.  Patient does have history of chronic pain in the right hip and low back and tells me that he is scheduled to have MRIs as an outpatient of this.  Does feel like the right hip pain is worse currently.  He denies dyspnea but does have some pain in the center of his chest.  He is on Eliquis for history of A-fib.  On exam he has a irregularly shaped macerated laceration on the forehead with some dried bleeding but there is no active bleeding.  There is no other signs of trauma.  He does not have any palpable chest wall tenderness or sternal tenderness or crepitus.  No C-spine tenderness.  He does have some pain with range of the hip but he is able to fully range.  Obtain CT head and C-spine which are negative for injury.  Obtain chest x-ray as well which does not show any obvious rib fracture or sternal fracture no pneumothorax.  X-ray of the hip is negative.  Patient was able to ambulate so my suspicion for  occult hip fracture is low.  Patient tetanus was up-to-date.  He did closed the laceration on his forehead with nonabsorbable suture.  Recommended to have these removed in about 7 days.    Given patient's chest pain EKG and labs were obtained from triage although I suspect that this is traumatic.  Troponin was negative.  Labs at baseline including his renal function.  EKG without obvious  ischemic changes.  Patient given Tylenol for pain.  Given no traumatic findings and he is able to ambulate w/ his walker I think that he is appropriate to be discharged.  Did discuss return precautions around any increasing headache or new neurologic findings given concern for possible delayed bleeding on Eliquis. FINAL CLINICAL IMPRESSION(S) / ED DIAGNOSES   Final diagnoses:  Laceration of forehead, initial encounter  Fall, initial encounter     Rx / DC Orders   ED Discharge Orders     None        Note:  This document was prepared using Dragon voice recognition software and may include unintentional dictation errors.   Rada Hay, MD 02/27/23 Ninfa Meeker    Rada Hay, MD 02/27/23 [phone]

## 2023-02-27 ENCOUNTER — Emergency Department: Payer: No Typology Code available for payment source

## 2023-02-27 NOTE — ED Notes (Signed)
MD states hold on drawing repeat troponin for now.   Pt laceration to R forehead cleansed and gauze placed while awaiting repair from physician.

## 2023-02-27 NOTE — ED Notes (Signed)
Patient transported to X-ray 

## 2023-02-27 NOTE — Discharge Instructions (Signed)
You should have your stitches removed in about 7 days.  Your x-ray of your chest hip and CT of your head and neck did not show any acute injuries.  However if your pain in your chest is worsening or you develop any new pain please return to the emergency department.  If you have any increasing headache confusion vomiting or numbness or weakness in any part of your body please return to the emergency department as there is potential for delayed bleeding in your brain.

## 2023-02-27 NOTE — ED Notes (Signed)
Pt ambulated to and from bathroom in room with walker with standby assist from RN. Pt able to bear weight and use walker independently. Pt then positioned back in bed for comfort. Bed low and locked with side rails raised and monitor in place.

## 2023-02-27 NOTE — ED Notes (Signed)
Patient returned from X-ray 

## 2023-02-27 NOTE — ED Notes (Signed)
Patient discharged at this time. Wheeled to lobby and assisted into vehicle. Breathing unlabored speaking in full sentences. Verbalized understanding of all discharge, follow up, and medication teaching. Discharged homed with all belongings.   

## 2023-02-27 NOTE — ED Notes (Signed)
ED Provider at bedside. 

## 2023-03-01 ENCOUNTER — Other Ambulatory Visit: Payer: Medicare Other

## 2023-03-14 ENCOUNTER — Ambulatory Visit
Admission: RE | Admit: 2023-03-14 | Discharge: 2023-03-14 | Disposition: A | Discharge status: Home / Self Care | Payer: Medicare Other | Source: Ambulatory Visit | Attending: Physical Medicine & Rehabilitation | Admitting: Physical Medicine & Rehabilitation

## 2023-03-14 DIAGNOSIS — G8929 Other chronic pain: Secondary | ICD-10-CM

## 2023-03-14 DIAGNOSIS — M546 Pain in thoracic spine: Secondary | ICD-10-CM

## 2023-03-14 MED ORDER — GADOPICLENOL 0.5 MMOL/ML IV SOLN
10.0000 mL | Freq: Once | INTRAVENOUS | Status: AC | PRN
  Administered 2023-03-14: 10 mL via INTRAVENOUS

## 2023-03-18 ENCOUNTER — Emergency Department
Admission: EM | Admit: 2023-03-18 | Discharge: 2023-03-18 | Disposition: A | Discharge status: Home / Self Care | Payer: No Typology Code available for payment source | Attending: Emergency Medicine | Admitting: Emergency Medicine

## 2023-03-18 ENCOUNTER — Emergency Department: Payer: No Typology Code available for payment source

## 2023-03-18 ENCOUNTER — Other Ambulatory Visit: Payer: Self-pay

## 2023-03-18 DIAGNOSIS — W19XXXA Unspecified fall, initial encounter: Secondary | ICD-10-CM

## 2023-03-18 DIAGNOSIS — Z7901 Long term (current) use of anticoagulants: Secondary | ICD-10-CM | POA: Diagnosis not present

## 2023-03-18 DIAGNOSIS — M545 Low back pain, unspecified: Secondary | ICD-10-CM | POA: Insufficient documentation

## 2023-03-18 DIAGNOSIS — M546 Pain in thoracic spine: Secondary | ICD-10-CM | POA: Diagnosis not present

## 2023-03-18 DIAGNOSIS — S0081XA Abrasion of other part of head, initial encounter: Secondary | ICD-10-CM | POA: Diagnosis not present

## 2023-03-18 DIAGNOSIS — S0990XA Unspecified injury of head, initial encounter: Secondary | ICD-10-CM | POA: Diagnosis present

## 2023-03-18 DIAGNOSIS — G8929 Other chronic pain: Secondary | ICD-10-CM

## 2023-03-18 LAB — URINALYSIS, ROUTINE W REFLEX MICROSCOPIC
Bacteria, UA: NONE SEEN
Bilirubin Urine: NEGATIVE
Glucose, UA: NEGATIVE mg/dL
Ketones, ur: 5 mg/dL — AB
Leukocytes,Ua: NEGATIVE
Nitrite: NEGATIVE
Protein, ur: 100 mg/dL — AB
Specific Gravity, Urine: 1.021 (ref 1.005–1.030)
pH: 6 (ref 5.0–8.0)

## 2023-03-18 LAB — CBC
HCT: 45.7 % (ref 39.0–52.0)
Hemoglobin: 14.4 g/dL (ref 13.0–17.0)
MCH: 30 pg (ref 26.0–34.0)
MCHC: 31.5 g/dL (ref 30.0–36.0)
MCV: 95.2 fL (ref 80.0–100.0)
Platelets: 183 10*3/uL (ref 150–400)
RBC: 4.8 MIL/uL (ref 4.22–5.81)
RDW: 14.5 % (ref 11.5–15.5)
WBC: 6.9 10*3/uL (ref 4.0–10.5)
nRBC: 0 % (ref 0.0–0.2)

## 2023-03-18 LAB — BASIC METABOLIC PANEL
Anion gap: 10 (ref 5–15)
BUN: 22 mg/dL (ref 8–23)
CO2: 25 mmol/L (ref 22–32)
Calcium: 9.6 mg/dL (ref 8.9–10.3)
Chloride: 102 mmol/L (ref 98–111)
Creatinine, Ser: 1.02 mg/dL (ref 0.61–1.24)
GFR, Estimated: >60 mL/min (ref >60)
Glucose, Bld: 113 mg/dL — ABNORMAL HIGH (ref 70–99)
Potassium: 4.2 mmol/L (ref 3.5–5.1)
Sodium: 137 mmol/L (ref 135–145)

## 2023-03-18 LAB — APTT: aPTT: 40 seconds — ABNORMAL HIGH (ref 24–36)

## 2023-03-18 MED ORDER — HYDROMORPHONE HCL 1 MG/ML IJ SOLN
1.0000 mg | Freq: Once | INTRAMUSCULAR | Status: AC
  Administered 2023-03-18: 1 mg via INTRAMUSCULAR
  Filled 2023-03-18: qty 1

## 2023-03-18 NOTE — ED Notes (Signed)
Pt A&O x4, no obvious distress noted, respirations regular/unlabored. Pt verbalizes understanding of discharge instructions. Pt able to ambulate from ED independently.   

## 2023-03-18 NOTE — ED Triage Notes (Signed)
Pt here with back pain and a fall. Pt states he has been dealing with the pain x2 days. Pt states he has had a fusion in his neck in the past and recently had an MRI of his back but has not been called with the results. Pt also states he had a fall on yesterday and hit the front of his head, pt is on blood thinners.

## 2023-03-18 NOTE — ED Provider Notes (Signed)
[facility] Provider Note    Event Date/Time   First MD Initiated Contact with Patient 03/18/23 2000     (approximate)   History   Back Pain and Fall   HPI  [name] is a 81 y.o. male  with pmh xtensive cardiac history previous TAVR, Afib on Eliquis, prior stroke with residual weakness, BPH and baseline urinary retention, prior multilevel occipital-T1 fusion and s/p T10-T11 Laminectomy with chronic thoracic and lumbar pain who presents after a fall.  Patient tells me that he has had ongoing pain in the mid back and low back for some time now.  His outpatient doctor ordered an MRI of his thoracic and lumbar spine but he is not aware of the results.  He is having increasing difficulty with his balance he has chronic urinary incontinence this is not new today.  Denies any saddle anesthesia or difficulty stooling or fecal incontinence.  Denies fevers or chills.  Today he did fall because he lost balance hitting his head.  Denies loss of consciousness.  Denies any numbness tingling in his extremities.  He takes oxycodone for pain and sees a pain specialist    Past Medical History:  Diagnosis Date   Cancer    Carpal tunnel syndrome    Cervical spondylosis with myelopathy    Chronic pain syndrome    Depression    Difficult intubation    needs smaller tube; awake oral fiberoptic scope 8.0 ETT 03/30/08   Dysphagia, pharyngeal phase    Hemorrhage of gastrointestinal tract, unspecified    History of cardiac cath    a. 03/2018 Cath: LAD and LCX with mild luminal irregularities.  No significant disease.   Iron deficiency anemia, unspecified    Lumbosacral spondylosis without myelopathy    Muscle weakness (generalized)    Other and unspecified disc disorder of cervical region    Other and unspecified hyperlipidemia    Patent foramen ovale    a. 05/2018 Echo: post-op TAVR--> + bublble study w/ L->R shunt.   Permanent atrial fibrillation    a.  Diagnosed in the  spring 2019.  Had rapid atrial fibrillation following TAVR and has been rate controlled with beta-blocker.  CHA2DS2VASc equals 7.  Supposed to be on Eliquis.   Primary localized osteoarthrosis, lower leg    Primary localized osteoarthrosis, shoulder region    Reflux esophagitis    Severe aortic stenosis    a. mild-mod AS by 01/2016 TTE, restricted AV opening with mod AR 01/2016 TEE; b. 10/2017 Echo: EF 60-65%, sev Ca2+ AoV, mean grad 82mmHg; c. 05/2018 s/p TAVR ([facility]); d. 06/2018 Echo: EF 55-60%, well-seated prosth AoV, peak velocity 156cm/s. AoV gradient 32mmHg.   Spinal stenosis, unspecified region other than cervical    Stroke    01/2016   Syncope and collapse    Unspecified arthropathy, lower leg    Unspecified constipation    Unspecified essential hypertension    Unspecified glaucoma(365.9)     Patient Active Problem List   Diagnosis Date Noted   Hypotension 11/04/2021   Laceration of forehead 11/04/2021   Chronic diastolic CHF (congestive heart failure) 11/04/2021   Renal cyst 11/04/2021   Hyperkalemia 11/04/2021   Fall 11/03/2021   Palliative care by specialist    Goals of care, counseling/discussion    UTI (urinary tract infection) 11/10/2019   AF (paroxysmal atrial fibrillation) 11/10/2019   Acute blood loss anemia 11/10/2019   Cellulitis 11/10/2019   AKI (acute kidney injury) 11/10/2019   History  of CVA (cerebrovascular accident) 11/10/2019   Abdominal pain 10/07/2019   Pressure injury of skin 10/07/2019   Atrial fibrillation with rapid ventricular response 10/06/2019   Acute encephalopathy    GI bleeding A999333   Acute metabolic encephalopathy A999333   Acute renal failure (ARF) 10/13/2018   S/P TAVR (transcatheter aortic valve replacement) 09/20/2018   Permanent atrial fibrillation 09/20/2018   Acute on chronic diastolic CHF (congestive heart failure) 09/18/2018   Heme positive stool 11/20/2017   Gait disorder 03/24/2013   Cervical post-laminectomy syndrome  03/26/2012   Depression 03/26/2012   Degenerative arthritis of shoulder region 03/26/2012   KNEE REPLACEMENT, BILATERAL, HX OF 06/04/2007   CELLULITIS/ABSCESS, ARM 05/05/2007   Anxiety state 04/08/2007   PAIN, CHRONIC NEC 04/08/2007   OSTEOARTHROSIS, GENERALIZED, MULTIPLE SITES 04/08/2007   SPONDYLOLISTHESIS 04/08/2007   INCONTINENCE, URGE 04/08/2007   HYPERCHOLESTEROLEMIA 03/07/2007   HYPERTENSION, BENIGN 03/07/2007     Physical Exam  Triage Vital Signs: ED Triage Vitals [03/18/23 1606]  Enc Vitals Group     BP 138/67     Pulse Rate (!) 121     Resp (!) 22     Temp 97.7 F (36.5 C)     Temp Source Oral     SpO2 94 %     Weight 255 lb 1.2 oz (115.7 kg)     Height 5\' 9"  (1.753 m)     Head Circumference      Peak Flow      Pain Score 5     Pain Loc      Pain Edu?      Excl. in [place]?     Most recent vital signs: Vitals:   03/18/23 2026 03/18/23 2027  BP: (!) 147/74   Pulse: (!) 112   Resp: 20   Temp:  98 F (36.7 C)  SpO2: 99%      General: Awake, no distress.  CV:  Good peripheral perfusion.  Resp:  Normal effort.  Abd:  No distention.  Neuro:             Awake, Alert, Oriented x 3  Other:  Patient has 5 out of 5 strength with elbow flexion extension and grip bilateral upper extremities, 5 out of 5 strength with hip flexion plantarflexion dorsiflexion bilateral lower extremities, sensation grossly intact in bilateral upper and lower extremities Superficial abrasion on the forehead No midline C, T or L-spine tenderness  ED Results / Procedures / Treatments  Labs (all labs ordered are listed, but only abnormal results are displayed) Labs Reviewed  BASIC METABOLIC PANEL - Abnormal; Notable for the following components:      Result Value   Glucose, Bld 113 (*)    All other components within normal limits  URINALYSIS, ROUTINE W REFLEX MICROSCOPIC - Abnormal; Notable for the following components:   Color, Urine YELLOW (*)    APPearance CLEAR (*)    Hgb urine  dipstick SMALL (*)    Ketones, ur 5 (*)    Protein, ur 100 (*)    All other components within normal limits  APTT - Abnormal; Notable for the following components:   aPTT 40 (*)    All other components within normal limits  CBC  CBG MONITORING, ED     EKG     RADIOLOGY I reviewed and interpreted the CT scan of the brain which does not show any acute intracranial process    PROCEDURES:  Critical Care performed: No  Procedures  The patient is on  the cardiac monitor to evaluate for evidence of arrhythmia and/or significant heart rate changes.   MEDICATIONS ORDERED IN ED: Medications  HYDROmorphone (DILAUDID) injection 1 mg (1 mg Intramuscular Given 03/18/23 2034)     IMPRESSION / MDM / ASSESSMENT AND PLAN / ED COURSE  I reviewed the triage vital signs and the nursing notes.                              Patient's presentation is most consistent with acute presentation with potential threat to life or bodily function.  Differential diagnosis includes, but is not limited to, intracranial hemorrhage, cervical spine fracture, low suspicion for spinal epidural abscess or cauda equina syndrome or acute cervical or thoracic cord compression  The patient is an 82 year old male with chronic pain in his CT and L-spine with multiple prior surgeries sees a pain specialist monthly presents because of a fall primarily.  He has had ongoing issues with pain has had MRI recently of the T and L-spine several days ago was not aware of the results.  Had a fall because he lost his balance today did not lose consciousness.  Did hit his head.  He is anticoagulated.  She is tachycardic on arrival EKG showing A-fib.  This did improve without intervention.  On exam he does have an abrasion on his forehead but no other signs of trauma does not have any midline CT or L-spine tenderness.  He has good strength in upper and lower extremities and normal sensation overall exam is not consistent with cauda  equina syndrome or acute cord compression.  Based on review of records patient symptomatology is pretty consistent with his prior pain and he is denying any new pain today.  I did obtain a CT head and C-spine given the fall and these are negative for acute abnormality.  Patient received IM Dilaudid for pain.  Encouraged follow-up with his pain specialist.       FINAL CLINICAL IMPRESSION(S) / ED DIAGNOSES   Final diagnoses:  Injury of head, initial encounter  Fall, initial encounter  Chronic midline thoracic back pain     Rx / DC Orders   ED Discharge Orders     None        Note:  This document was prepared using Dragon voice recognition software and may include unintentional dictation errors.   Rada Hay, MD 03/18/23 2329

## 2023-04-11 ENCOUNTER — Emergency Department (HOSPITAL_COMMUNITY): Payer: Medicare Other

## 2023-04-11 ENCOUNTER — Encounter (HOSPITAL_COMMUNITY): Payer: Self-pay

## 2023-04-11 ENCOUNTER — Other Ambulatory Visit: Payer: Self-pay

## 2023-04-11 ENCOUNTER — Emergency Department (HOSPITAL_COMMUNITY)
Admission: EM | Admit: 2023-04-11 | Discharge: 2023-04-12 | Disposition: A | Discharge status: Home / Self Care | Payer: Medicare Other | Attending: Emergency Medicine | Admitting: Emergency Medicine

## 2023-04-11 DIAGNOSIS — Z8673 Personal history of transient ischemic attack (TIA), and cerebral infarction without residual deficits: Secondary | ICD-10-CM | POA: Diagnosis not present

## 2023-04-11 DIAGNOSIS — Z7901 Long term (current) use of anticoagulants: Secondary | ICD-10-CM | POA: Insufficient documentation

## 2023-04-11 DIAGNOSIS — I110 Hypertensive heart disease with heart failure: Secondary | ICD-10-CM | POA: Diagnosis not present

## 2023-04-11 DIAGNOSIS — R079 Chest pain, unspecified: Secondary | ICD-10-CM | POA: Diagnosis present

## 2023-04-11 DIAGNOSIS — I5032 Chronic diastolic (congestive) heart failure: Secondary | ICD-10-CM | POA: Diagnosis not present

## 2023-04-11 DIAGNOSIS — I4821 Permanent atrial fibrillation: Secondary | ICD-10-CM | POA: Insufficient documentation

## 2023-04-11 LAB — COMPREHENSIVE METABOLIC PANEL
ALT: 15 U/L (ref 0–44)
AST: 18 U/L (ref 15–41)
Albumin: 3.8 g/dL (ref 3.5–5.0)
Alkaline Phosphatase: 79 U/L (ref 38–126)
Anion gap: 8 (ref 5–15)
BUN: 19 mg/dL (ref 8–23)
CO2: 25 mmol/L (ref 22–32)
Calcium: 9.1 mg/dL (ref 8.9–10.3)
Chloride: 103 mmol/L (ref 98–111)
Creatinine, Ser: 1.02 mg/dL (ref 0.61–1.24)
GFR, Estimated: >60 mL/min (ref >60)
Glucose, Bld: 93 mg/dL (ref 70–99)
Potassium: 4.4 mmol/L (ref 3.5–5.1)
Sodium: 136 mmol/L (ref 135–145)
Total Bilirubin: 1 mg/dL (ref 0.3–1.2)
Total Protein: 7.4 g/dL (ref 6.5–8.1)

## 2023-04-11 LAB — CBC
HCT: 44.6 % (ref 39.0–52.0)
Hemoglobin: 14.4 g/dL (ref 13.0–17.0)
MCH: 31.4 pg (ref 26.0–34.0)
MCHC: 32.3 g/dL (ref 30.0–36.0)
MCV: 97.2 fL (ref 80.0–100.0)
Platelets: 194 10*3/uL (ref 150–400)
RBC: 4.59 MIL/uL (ref 4.22–5.81)
RDW: 15.2 % (ref 11.5–15.5)
WBC: 10.3 10*3/uL (ref 4.0–10.5)
nRBC: 0 % (ref 0.0–0.2)

## 2023-04-11 LAB — I-STAT CHEM 8, ED
BUN: 20 mg/dL (ref 8–23)
Calcium, Ion: 1.22 mmol/L (ref 1.15–1.40)
Chloride: 103 mmol/L (ref 98–111)
Creatinine, Ser: 1 mg/dL (ref 0.61–1.24)
Glucose, Bld: 86 mg/dL (ref 70–99)
HCT: 44 % (ref 39.0–52.0)
Hemoglobin: 15 g/dL (ref 13.0–17.0)
Potassium: 4.7 mmol/L (ref 3.5–5.1)
Sodium: 138 mmol/L (ref 135–145)
TCO2: 28 mmol/L (ref 22–32)

## 2023-04-11 LAB — MAGNESIUM: Magnesium: 2.2 mg/dL (ref 1.7–2.4)

## 2023-04-11 LAB — TROPONIN I (HIGH SENSITIVITY): Troponin I (High Sensitivity): 8 ng/L (ref <18)

## 2023-04-11 LAB — ETHANOL: Alcohol, Ethyl (B): <10 mg/dL (ref <10)

## 2023-04-11 LAB — LIPASE, BLOOD: Lipase: 23 U/L (ref 11–51)

## 2023-04-11 MED ORDER — IOHEXOL 350 MG/ML SOLN
100.0000 mL | Freq: Once | INTRAVENOUS | Status: AC | PRN
  Administered 2023-04-11: 100 mL via INTRAVENOUS

## 2023-04-11 MED ORDER — ONDANSETRON HCL 4 MG/2ML IJ SOLN
4.0000 mg | Freq: Four times a day (QID) | INTRAMUSCULAR | Status: DC | PRN
  Administered 2023-04-11: 4 mg via INTRAVENOUS
  Filled 2023-04-11: qty 2

## 2023-04-11 MED ORDER — HYDROMORPHONE HCL 1 MG/ML IJ SOLN
0.5000 mg | Freq: Once | INTRAMUSCULAR | Status: AC
  Administered 2023-04-11: 0.5 mg via INTRAVENOUS
  Filled 2023-04-11: qty 1

## 2023-04-11 NOTE — ED Triage Notes (Signed)
Pt states he has chronic CP but worse tonight and sternal in location.  Pt was not compliant with EMS not with [place] to get an EKG.  Refused to lie still.

## 2023-04-11 NOTE — ED Provider Notes (Signed)
EMERGENCY DEPARTMENT AT [facility] Provider Note   CSN: [id number] Arrival date & time: 04/11/23  2123     History  Chief Complaint  Patient presents with   Chest Pain    [name] is a 82 y.o. male with HTN, HLD, cervical post-laminectomy syndrome, status post TAVR, permanent A-fib, history of GI bleeding, history of CVA, palliative care/goals of care discussions, chronic diastolic CHF, disseminated idiopathic skeletal hyperostosis, gout, GERD pressure ulcers of buttocks, schizoaffective disorder, BPH and baseline urinary retention, who presents with SOB/CP.   Reports acute onset severe 10/10 chest pain and back pain, feels like "choking" but he states he isn't choking on anything. States he wasn't eating when it started. He appears in severe distress and continues to gesture towards his chest, back, and epigastric region. No h/o similar. Denies cough, throat swelling sensation, f/c, cough, abd pain, N/V. Further history limited by patient distress.   HPI     Home Medications Prior to Admission medications   Medication Sig Start Date End Date Taking? Authorizing Provider  apixaban (ELIQUIS) 5 MG TABS tablet Take 1 tablet (5 mg total) by mouth 2 (two) times daily. 10/20/18   Leroy Sea, MD  ascorbic acid (VITAMIN C) 250 MG tablet Take 1 tablet by mouth 2 (two) times daily. 09/02/19   [provider]  aspirin EC 81 MG tablet Take 81 mg by mouth daily.    [provider]  buPROPion (WELLBUTRIN) 100 MG tablet Take 100 mg by mouth 2 (two) times daily.    [provider]  cyanocobalamin (,VITAMIN B-12,) 1000 MCG/ML injection Inject 1 mL into the muscle every 28 (twenty-eight) days. 03/15/20   [provider]  diltiazem (CARDIZEM CD) 120 MG 24 hr capsule Take 1 capsule (120 mg total) by mouth daily. 10/09/19   Kathlen Mody, MD  ferrous sulfate 325 (65 FE) MG tablet Take 325 mg by mouth daily. 03/15/20   [provider]  fluticasone (FLONASE) 50 MCG/ACT nasal spray Place 1 spray into both nostrils daily as needed for allergies or rhinitis.  07/30/18   [provider]  furosemide (LASIX) 20 MG tablet Take 20 mg by mouth daily.    [provider]  gabapentin (NEURONTIN) 600 MG tablet Take 600 mg by mouth 3 (three) times daily. 03/15/20   [provider]  metoprolol succinate (TOPROL-XL) 25 MG 24 hr tablet Take 25 mg by mouth daily.    [provider]  oxyCODONE-acetaminophen (PERCOCET) 10-325 MG tablet Take 1 tablet by mouth every 6 (six) hours as needed.    [provider]  pantoprazole (PROTONIX) 40 MG tablet Take 1 tablet (40 mg total) by mouth 2 (two) times daily. 10/16/18   Leroy Sea, MD  polyethylene glycol (MIRALAX / GLYCOLAX) packet Take 17 g by mouth daily.    [provider]  potassium chloride (K-DUR,KLOR-CON) 20 MEQ tablet Take 1 tablet (20 mEq total) by mouth daily. Patient taking differently: Take 20 mEq by mouth at bedtime. 09/20/18   Strader, Lennart Pall, PA-C  rOPINIRole (REQUIP) 0.25 MG tablet Take 0.25 mg by mouth 2 (two) times daily.  10/09/18   [provider]  simvastatin (ZOCOR) 40 MG tablet Take 40 mg by mouth at bedtime.    [provider]  spironolactone (ALDACTONE) 25 MG tablet Take 25 mg by mouth daily.    [provider]  tamsulosin (FLOMAX) 0.4 MG CAPS capsule Take 0.4 mg by mouth daily. 09/02/19  [provider]      Allergies    Lorazepam, Duloxetine, Pregabalin, and Zanaflex [tizanidine hcl]    Review of Systems   Review of Systems  Reason unable to perform ROS: patient distress.    Physical Exam Updated Vital Signs BP (!) 147/34 (BP Location: Left Arm)   Pulse (!) 103   Temp 98.1 F (36.7 C) (Oral)   Resp 20   Ht 5\' 5"  (1.651 m)   Wt 115.7 kg   SpO2 98%   BMI 42.43 kg/m  Physical Exam General: Extremely uncomfortable-appearing, distressed male, lying in bed.  HEENT:  PERRLA, Sclera anicteric, MMM, trachea midline.  Cardiology: RRR, no murmurs/rubs/gallops. BL radial and DP pulses equal bilaterally.  Resp: Normal respiratory rate and effort. CTAB, no wheezes, rhonchi, crackles.  Abd: Soft, non-tender, non-distended. No rebound tenderness or guarding.  GU: Deferred. MSK: No peripheral edema or signs of trauma. Extremities without deformity or TTP. No cyanosis or clubbing. Skin: warm, dry. No rashes or lesions. Back: No signs of trauma, no midline C T or L spine TTP. Previous cervical fusion scars.  Neuro: A&Ox4, CNs II-XII grossly intact. MAEs. Sensation grossly intact.  Psych: Distressed, trying to sit up repeatedly, cannot get comfortable in the bed  ED Results / Procedures / Treatments   Labs (all labs ordered are listed, but only abnormal results are displayed) Labs Reviewed  CBC  COMPREHENSIVE METABOLIC PANEL  MAGNESIUM  LIPASE, BLOOD  TROPONIN I (HIGH SENSITIVITY)    EKG EKG Interpretation  Date/Time:  Thursday April 11 2023 21:45:19 EDT Ventricular Rate:  123 PR Interval:    QRS Duration: 105 QT Interval:  337 QTC Calculation: 471 R Axis:   44 Text Interpretation: Atrial fibrillation with RVR Paired ventricular premature complexes Artifact significantly limits interpretation Confirmed by Vivi Barrack [id number]) on 04/11/2023 10:36:49 PM  Radiology DG Chest 2 View  Result Date: 04/11/2023 CLINICAL DATA:  Ongoing chest pain and sternal pain. EXAM: CHEST - 2 VIEW COMPARISON:  02/26/2023 FINDINGS: Shallow inspiration. Heart size and pulmonary vascularity are normal for technique. Probable atelectasis in the lung bases. No pleural effusions. No pneumothorax. Mediastinal contours appear intact. Postoperative changes in the cervical and thoracic spine. Degenerative changes in the spine and shoulders. Ascending aortic stent. IMPRESSION: Shallow inspiration.  Probable atelectasis in the lung bases. Electronically Signed   By: Burman Nieves M.D.    On: 04/11/2023 22:30    Procedures Procedures    Medications Ordered in ED Medications  ondansetron [facility]) injection 4 mg (4 mg Intravenous Given 04/11/23 2238)  HYDROmorphone (DILAUDID) injection 0.5 mg (0.5 mg Intravenous Given 04/11/23 2238)    ED Course/ Medical Decision Making/ A&P                          Medical Decision Making Amount and/or Complexity of Data Reviewed Labs: ordered. Decision-making details documented in ED Course. Radiology: ordered. Decision-making details documented in ED Course.  Risk Prescription drug management.    This patient presents to the ED for concern of acute onset chest/back pain; this involves an extensive number of treatment options, and is a complaint that carries with it a high risk of complications and morbidity.  I considered the following differential and admission for this acute, potentially life threatening condition.   MDM:    DDX for chest pain includes but is not limited to:  Given patient's level of distress and complaint of chest pain radiating to back great concern for  AAS will get CTA chest abdomen pelvis. Also consider ACS/arrhythmia,  PE, PNA, PTX, esophogeal rupture, biliary disease, cardiac tamponade, GERD/PUD/gastritis, or musculoskeletal pain. Will also get CXR as can be completed more emergently. He is tachycardic but otherwise HDS and will give analgesia with dilaudid.    Clinical Course as of 04/11/23 2242  Thu Apr 11, 2023  2236 DG Chest 2 View Shallow inspiration.  Probable atelectasis in the lung bases. [HN]  2240 CBC wnl [HN]    Clinical Course User Index [HN] Loetta Rough, MD    Labs: I Ordered, and personally interpreted labs.  The pertinent results include:  those listed above, remainder pending  Imaging Studies ordered: I ordered imaging studies including CXR, CT dissection I independently visualized and interpreted imaging. I agree with the radiologist interpretation  Additional history  obtained from chart review.    Cardiac Monitoring: The patient was maintained on a cardiac monitor.  I personally viewed and interpreted the cardiac monitored which showed an underlying rhythm of: sinus tachycardia  Reevaluation: After the interventions noted above, I reevaluated the patient and found that they have :improved  Social Determinants of Health: Patient lives independently   Disposition:  Patient is signed out to the oncoming ED physician Dr. Adela Lank who is made aware of his history, presentation, exam, workup, and plan.    Co morbidities that complicate the patient evaluation  Past Medical History:  Diagnosis Date   Cancer [facility])    Carpal tunnel syndrome    Cervical spondylosis with myelopathy    Chronic pain syndrome    Depression    Difficult intubation    needs smaller tube; awake oral fiberoptic scope 8.0 ETT 03/30/08   Dysphagia, pharyngeal phase    Hemorrhage of gastrointestinal tract, unspecified    History of cardiac cath    a. 03/2018 Cath: LAD and LCX with mild luminal irregularities.  No significant disease.   Iron deficiency anemia, unspecified    Lumbosacral spondylosis without myelopathy    Muscle weakness (generalized)    Other and unspecified disc disorder of cervical region    Other and unspecified hyperlipidemia    Patent foramen ovale    a. 05/2018 Echo: post-op TAVR--> + bublble study w/ L->R shunt.   Permanent atrial fibrillation (HCC)    a.  Diagnosed in the spring 2019.  Had rapid atrial fibrillation following TAVR and has been rate controlled with beta-blocker.  CHA2DS2VASc equals 7.  Supposed to be on Eliquis.   Primary localized osteoarthrosis, lower leg    Primary localized osteoarthrosis, shoulder region    Reflux esophagitis    Severe aortic stenosis    a. mild-mod AS by 01/2016 TTE, restricted AV opening with mod AR 01/2016 TEE; b. 10/2017 Echo: EF 60-65%, sev Ca2+ AoV, mean grad ; c. 05/2018 s/p TAVR ([facility]); d. 06/2018 Echo: EF  55-60%, well-seated prosth AoV, peak velocity 156cm/s. AoV gradient .   Spinal stenosis, unspecified region other than cervical    Stroke (HCC)    01/2016   Syncope and collapse    Unspecified arthropathy, lower leg    Unspecified constipation    Unspecified essential hypertension    Unspecified glaucoma(365.9)      Medicines Meds ordered this encounter  Medications   HYDROmorphone (DILAUDID) injection 0.5 mg   ondansetron (ZOFRAN) injection 4 mg    I have reviewed the patients home medicines and have made adjustments as needed  Problem List / ED Course: Problem List Items Addressed This Visit  None Visit Diagnoses     Nonspecific chest pain    -  Primary                   This note was created using dictation software, which may contain spelling or grammatical errors.    Loetta Rough, MD 04/23/23 [phone]

## 2023-04-11 NOTE — ED Notes (Signed)
Pt placed on 2lpm o2 via  after pt received IV Dilaudid due to O2 sats dipping into the 80s. O2 sat came back up to 95%

## 2023-04-11 NOTE — ED Notes (Signed)
Called lab to add-on Ethanol level.

## 2023-04-12 NOTE — ED Provider Notes (Signed)
Received patient in turnover from Dr. Jearld Fenton.  Please see their note for further details of Hx, PE.  Briefly patient is a 82 y.o. male with a Chest Pain .  Patient with severe pain upon arrival.  Radiating to the back.  Concerning for acute aortic syndrome.  Troponins negative.  Plan for CT angiogram of the chest abdomen pelvis to assess for dissection.  This has resulted and is negative.  Results were discussed with the patient.  PCP follow-up.    Melene Plan, DO 04/12/23 [phone]

## 2023-04-12 NOTE — ED Notes (Signed)
Called and spoke with Steward Drone, patient's sister, whom he lives with and asked how patient could get into the home. She stated that she either would be home or would leave the side door unlocked. Social work notified. PTAR will be set up at 0800.

## 2023-04-12 NOTE — Progress Notes (Signed)
This CSW was added to a chat with Geophysical data processor and Retail banker. RN contacted pt's sister who will leave the side door open in the event she is not home when pt returns. Pt to return home via PTAR. RN to call PTAR. No further TOC needs.

## 2023-04-12 NOTE — ED Notes (Signed)
PTAR called and arranged for patient to go home.

## 2023-04-12 NOTE — ED Notes (Signed)
Assumed care of patient. Patient resting comfortably in bed with no signs of acute distress noted.Waiting on social work and transportation home.

## 2023-04-12 NOTE — Discharge Instructions (Signed)
Your workup here was not consistent with a heart attack.  Your CT scan did not show any obvious concerning pathology in the chest abdomen or pelvis.  This does not mean that nothing is wrong with you.  If this pain recurs especially with exertion then follow-up with your family doctor immediately return to the Emergency Department for repeat assessment.

## 2023-04-29 ENCOUNTER — Emergency Department (HOSPITAL_BASED_OUTPATIENT_CLINIC_OR_DEPARTMENT_OTHER): Payer: Medicare Other | Admitting: Radiology

## 2023-04-29 ENCOUNTER — Other Ambulatory Visit: Payer: Self-pay

## 2023-04-29 ENCOUNTER — Emergency Department (HOSPITAL_BASED_OUTPATIENT_CLINIC_OR_DEPARTMENT_OTHER)
Admission: EM | Admit: 2023-04-29 | Discharge: 2023-04-29 | Disposition: A | Discharge status: Home / Self Care | Payer: Medicare Other | Attending: Emergency Medicine | Admitting: Emergency Medicine

## 2023-04-29 ENCOUNTER — Encounter (HOSPITAL_BASED_OUTPATIENT_CLINIC_OR_DEPARTMENT_OTHER): Payer: Self-pay

## 2023-04-29 DIAGNOSIS — R0789 Other chest pain: Secondary | ICD-10-CM | POA: Diagnosis present

## 2023-04-29 DIAGNOSIS — I110 Hypertensive heart disease with heart failure: Secondary | ICD-10-CM | POA: Diagnosis not present

## 2023-04-29 DIAGNOSIS — Z79899 Other long term (current) drug therapy: Secondary | ICD-10-CM | POA: Diagnosis not present

## 2023-04-29 DIAGNOSIS — R0602 Shortness of breath: Secondary | ICD-10-CM | POA: Diagnosis not present

## 2023-04-29 DIAGNOSIS — Z7982 Long term (current) use of aspirin: Secondary | ICD-10-CM | POA: Insufficient documentation

## 2023-04-29 DIAGNOSIS — Z7901 Long term (current) use of anticoagulants: Secondary | ICD-10-CM | POA: Insufficient documentation

## 2023-04-29 DIAGNOSIS — I5032 Chronic diastolic (congestive) heart failure: Secondary | ICD-10-CM | POA: Diagnosis not present

## 2023-04-29 DIAGNOSIS — I4891 Unspecified atrial fibrillation: Secondary | ICD-10-CM | POA: Diagnosis not present

## 2023-04-29 LAB — CBC
HCT: 44.9 % (ref 39.0–52.0)
Hemoglobin: 14.5 g/dL (ref 13.0–17.0)
MCH: 31 pg (ref 26.0–34.0)
MCHC: 32.3 g/dL (ref 30.0–36.0)
MCV: 96.1 fL (ref 80.0–100.0)
Platelets: 195 10*3/uL (ref 150–400)
RBC: 4.67 MIL/uL (ref 4.22–5.81)
RDW: 14.8 % (ref 11.5–15.5)
WBC: 6.1 10*3/uL (ref 4.0–10.5)
nRBC: 0 % (ref 0.0–0.2)

## 2023-04-29 LAB — BRAIN NATRIURETIC PEPTIDE: B Natriuretic Peptide: 190.3 pg/mL — ABNORMAL HIGH (ref 0.0–100.0)

## 2023-04-29 LAB — BASIC METABOLIC PANEL
Anion gap: 11 (ref 5–15)
BUN: 29 mg/dL — ABNORMAL HIGH (ref 8–23)
CO2: 25 mmol/L (ref 22–32)
Calcium: 9.8 mg/dL (ref 8.9–10.3)
Chloride: 100 mmol/L (ref 98–111)
Creatinine, Ser: 1.26 mg/dL — ABNORMAL HIGH (ref 0.61–1.24)
GFR, Estimated: 58 mL/min — ABNORMAL LOW (ref >60)
Glucose, Bld: 91 mg/dL (ref 70–99)
Potassium: 4.6 mmol/L (ref 3.5–5.1)
Sodium: 136 mmol/L (ref 135–145)

## 2023-04-29 LAB — TROPONIN I (HIGH SENSITIVITY)
Troponin I (High Sensitivity): 5 ng/L (ref <18)
Troponin I (High Sensitivity): 4 ng/L (ref <18)

## 2023-04-29 NOTE — ED Notes (Signed)
Lab called for add on lab

## 2023-04-29 NOTE — ED Provider Notes (Signed)
[place] EMERGENCY DEPARTMENT AT [facility] Provider Note   CSN: [id number] Arrival date & time: 04/29/23  1500     History  Chief Complaint  Patient presents with   Chest Pain    [name] is a 82 y.o. male with history hypertension, hyperlipidemia, TAVR, A fib on eliquis, reflux, chronic diastolic heart failure, presenting to the ED with complaint of chest pain and shortness of breath.  The patient reports that for "a few days" he has noted shortness of breath, sometimes associated with exertion and sometimes not.  He is also noted pain in his chest and also feeling pain up towards his jaw.  This is not necessarily associated with exertion.  He is not certain whether these occur together.  He currently has none of the symptoms  Patient was seen in the ED approximately 3 weeks ago for chest pain and shortness of breath.  He was complaining of significant chest pain at that time, and had an angiogram dissection study performed, which showed mild cardiomegaly but no other acute emergencies, including no aortic aneurysm or dissection.  HPI     Home Medications Prior to Admission medications   Medication Sig Start Date End Date Taking? Authorizing Provider  apixaban (ELIQUIS) 5 MG TABS tablet Take 1 tablet (5 mg total) by mouth 2 (two) times daily. 10/20/18   Leroy Sea, MD  ascorbic acid (VITAMIN C) 250 MG tablet Take 1 tablet by mouth 2 (two) times daily. 09/02/19   [provider]  aspirin EC 81 MG tablet Take 81 mg by mouth daily.    [provider]  buPROPion (WELLBUTRIN) 100 MG tablet Take 100 mg by mouth 2 (two) times daily.    [provider]  cyanocobalamin (,VITAMIN B-12,) 1000 MCG/ML injection Inject 1 mL into the muscle every 28 (twenty-eight) days. 03/15/20   [provider]  diltiazem (CARDIZEM CD) 120 MG 24 hr capsule Take 1 capsule (120 mg total) by mouth daily. 10/09/19   Kathlen Mody, MD  ferrous sulfate 325 (65  FE) MG tablet Take 325 mg by mouth daily. 03/15/20   [provider]  fluticasone (FLONASE) 50 MCG/ACT nasal spray Place 1 spray into both nostrils daily as needed for allergies or rhinitis.  07/30/18   [provider]  furosemide (LASIX) 20 MG tablet Take 20 mg by mouth daily.    [provider]  gabapentin (NEURONTIN) 600 MG tablet Take 600 mg by mouth 3 (three) times daily. 03/15/20   [provider]  metoprolol succinate (TOPROL-XL) 25 MG 24 hr tablet Take 25 mg by mouth daily.    [provider]  oxyCODONE-acetaminophen (PERCOCET) 10-325 MG tablet Take 1 tablet by mouth every 6 (six) hours as needed.    [provider]  pantoprazole (PROTONIX) 40 MG tablet Take 1 tablet (40 mg total) by mouth 2 (two) times daily. 10/16/18   Leroy Sea, MD  polyethylene glycol (MIRALAX / GLYCOLAX) packet Take 17 g by mouth daily.    [provider]  potassium chloride (K-DUR,KLOR-CON) 20 MEQ tablet Take 1 tablet (20 mEq total) by mouth daily. Patient taking differently: Take 20 mEq by mouth at bedtime. 09/20/18   Strader, Lennart Pall, PA-C  rOPINIRole (REQUIP) 0.25 MG tablet Take 0.25 mg by mouth 2 (two) times daily.  10/09/18   [provider]  simvastatin (ZOCOR) 40 MG tablet Take 40 mg by mouth at bedtime.    [provider]  spironolactone (ALDACTONE) 25  MG tablet Take 25 mg by mouth daily.    [provider]  tamsulosin (FLOMAX) 0.4 MG CAPS capsule Take 0.4 mg by mouth daily. 09/02/19   [provider]      Allergies    Lorazepam, Duloxetine, Pregabalin, and Zanaflex [tizanidine hcl]    Review of Systems   Review of Systems  Physical Exam Updated Vital Signs BP (!) 117/91   Pulse 72   Temp 97.8 F (36.6 C) (Oral)   Resp 17   Ht 5\' 5"  (1.651 m)   Wt 115.7 kg   SpO2 94%   BMI 42.43 kg/m  Physical Exam Constitutional:      General: He is not in acute distress. HENT:     Head: Normocephalic  and atraumatic.  Eyes:     Conjunctiva/sclera: Conjunctivae normal.     Pupils: Pupils are equal, round, and reactive to light.  Cardiovascular:     Rate and Rhythm: Normal rate. Rhythm irregular.  Pulmonary:     Effort: Pulmonary effort is normal. No respiratory distress.  Abdominal:     General: There is no distension.     Tenderness: There is no abdominal tenderness.  Musculoskeletal:     Right lower leg: No edema.     Left lower leg: No edema.  Skin:    General: Skin is warm and dry.  Neurological:     General: No focal deficit present.     Mental Status: He is alert. Mental status is at baseline.  Psychiatric:        Mood and Affect: Mood normal.        Behavior: Behavior normal.     ED Results / Procedures / Treatments   Labs (all labs ordered are listed, but only abnormal results are displayed) Labs Reviewed  BASIC METABOLIC PANEL - Abnormal; Notable for the following components:      Result Value   BUN 29 (*)    Creatinine, Ser 1.26 (*)    GFR, Estimated 58 (*)    All other components within normal limits  BRAIN NATRIURETIC PEPTIDE - Abnormal; Notable for the following components:   B Natriuretic Peptide 190.3 (*)    All other components within normal limits  CBC  TROPONIN I (HIGH SENSITIVITY)  TROPONIN I (HIGH SENSITIVITY)    EKG EKG Interpretation  Date/Time:  Monday Apr 29 2023 18:34:01 EDT Ventricular Rate:  84 PR Interval:    QRS Duration: 101 QT Interval:  385 QTC Calculation: 456 R Axis:   115 Text Interpretation: Atrial fibrillation Anterior infarct, old Confirmed by Alvester Chou [phone]) on 04/29/2023 6:41:39 PM  Radiology DG Chest 2 View  Result Date: 04/29/2023 CLINICAL DATA:  Chest pain shortness of breath 1-2 weeks. EXAM: CHEST - 2 VIEW COMPARISON:  04/11/2023 FINDINGS: Lungs are adequately inflated without focal airspace consolidation or effusion. Cardiomediastinal silhouette is normal. Fusion hardware intact and unchanged of the  cervical spine and lower thoracic spine. Evidence of previous surgery of the right shoulder. Moderate degenerative change of the spine. Prosthetic aortic valve unchanged. IMPRESSION: No active cardiopulmonary disease. Electronically Signed   By: Elberta Fortis M.D.   On: 04/29/2023 15:56    Procedures Procedures    Medications Ordered in ED Medications - No data to display  ED Course/ Medical Decision Making/ A&P Clinical Course as of 04/29/23 2320  Mon Apr 29, 2023  1830 Patient was reassessed and remains asymptomatic here in the ED.  I do believe he is stable for discharge with  his cardiologist as an outpatient, the patient is wanting to go home.  We discussed return precautions. [MT]  1841 Repeat EKG is stable.  This is obtained due to motion artifact on initial EKG.  Did not see anything ischemic on this repeat.  He is okay for discharge [MT]    Clinical Course User Index [MT] Riese Hellard, Kermit Balo, MD                             Medical Decision Making Amount and/or Complexity of Data Reviewed Labs: ordered. Radiology: ordered. ECG/medicine tests: ordered.   This patient presents to the ED with concern for chest pains, shortness of breath. This involves an extensive number of treatment options, and is a complaint that carries with it a high risk of complications and morbidity.  The differential diagnosis includes anginal chest pain versus ACS versus pneumonia versus pneumothorax versus anemia vs other.  Co-morbidities that complicate the patient evaluation: History of heart failure and valvular surgery at high risk of cardiac complications  External records from outside source obtained and reviewed including prior ED visit in April for her chest pain, negative aortic dissection study  I ordered and personally interpreted labs.  The pertinent results include: Delta troponin is negative.  No leukocytosis, no acute anemia.  BUN and creatinine very slightly elevated above baseline  I  ordered imaging studies including x-ray of the chest I independently visualized and interpreted imaging which showed no abnormal findings I agree with the radiologist interpretation  The patient was maintained on a cardiac monitor.  I personally viewed and interpreted the cardiac monitored which showed an underlying rhythm of: A-fib that is rate controlled  Per my interpretation the patient's ECG shows no acute ischemic findings, chronic A-fib  Patient was not having any active chest pain or discomfort at this time to warrant nitroglycerin or other emergent medications.  I have reviewed the patients home medicines and have made adjustments as needed  Test Considered: Low suspicion for aortic dissection or acute PE.  Patient is compliant with his Eliquis.  No hypoxia or tachycardia.  No aortic aneurysm noted on CT imaging last month in the ED.  After the interventions noted above, I reevaluated the patient and found that they have: improved  Patient made asymptomatic in the ED   Dispostion:  After consideration of the diagnostic results and the patients response to treatment, I feel that the patent would benefit from close outpatient follow-up.         Final Clinical Impression(s) / ED Diagnoses Final diagnoses:  Chest discomfort    Rx / DC Orders ED Discharge Orders     None         Terald Sleeper, MD 04/29/23 2320

## 2023-04-29 NOTE — ED Notes (Signed)
Pt verbalized understanding of d/c instructions, meds, and followup care. Denies questions. VSS, no distress noted. Steady gait to exit with all belongings.  ?

## 2023-04-29 NOTE — ED Triage Notes (Signed)
Patient here POV from Home.  Endorses Cp and SOB for approximately 1-2 Weeks. Aggravating in nature. No N/V. No Fever.   NAD Noted during Triage. A&Ox4. GCS 15. Ambulatory with Cane.

## 2023-04-29 NOTE — Discharge Instructions (Signed)
It is very important that you follow-up with your cardiologist for these visits for chest pain to the ER.  Your workup was reassuring today, but a single workup in the ER does not rule out all potential serious heart disease.  If your symptoms return, or if you begin to feel dizzy, lightheaded, short of breath, or any other emergency concerns, please call 911 or return to the ER.

## 2023-05-14 ENCOUNTER — Emergency Department (HOSPITAL_COMMUNITY)
Admission: EM | Admit: 2023-05-14 | Discharge: 2023-05-14 | Disposition: A | Discharge status: Home / Self Care | Payer: No Typology Code available for payment source | Attending: Emergency Medicine | Admitting: Emergency Medicine

## 2023-05-14 ENCOUNTER — Emergency Department (HOSPITAL_COMMUNITY): Payer: No Typology Code available for payment source

## 2023-05-14 ENCOUNTER — Other Ambulatory Visit: Payer: Self-pay

## 2023-05-14 ENCOUNTER — Encounter (HOSPITAL_COMMUNITY): Payer: Self-pay

## 2023-05-14 DIAGNOSIS — I2510 Atherosclerotic heart disease of native coronary artery without angina pectoris: Secondary | ICD-10-CM | POA: Diagnosis not present

## 2023-05-14 DIAGNOSIS — N200 Calculus of kidney: Secondary | ICD-10-CM | POA: Insufficient documentation

## 2023-05-14 DIAGNOSIS — I509 Heart failure, unspecified: Secondary | ICD-10-CM | POA: Diagnosis not present

## 2023-05-14 DIAGNOSIS — Y92410 Unspecified street and highway as the place of occurrence of the external cause: Secondary | ICD-10-CM | POA: Diagnosis not present

## 2023-05-14 DIAGNOSIS — Z7982 Long term (current) use of aspirin: Secondary | ICD-10-CM | POA: Insufficient documentation

## 2023-05-14 DIAGNOSIS — I700 Atherosclerosis of aorta: Secondary | ICD-10-CM | POA: Insufficient documentation

## 2023-05-14 DIAGNOSIS — Z8673 Personal history of transient ischemic attack (TIA), and cerebral infarction without residual deficits: Secondary | ICD-10-CM | POA: Diagnosis not present

## 2023-05-14 DIAGNOSIS — Z7901 Long term (current) use of anticoagulants: Secondary | ICD-10-CM | POA: Insufficient documentation

## 2023-05-14 DIAGNOSIS — S0083XA Contusion of other part of head, initial encounter: Secondary | ICD-10-CM | POA: Diagnosis not present

## 2023-05-14 DIAGNOSIS — M542 Cervicalgia: Secondary | ICD-10-CM | POA: Diagnosis present

## 2023-05-14 DIAGNOSIS — S2232XA Fracture of one rib, left side, initial encounter for closed fracture: Secondary | ICD-10-CM | POA: Diagnosis not present

## 2023-05-14 DIAGNOSIS — R918 Other nonspecific abnormal finding of lung field: Secondary | ICD-10-CM | POA: Diagnosis not present

## 2023-05-14 LAB — CBC
HCT: 37.7 % — ABNORMAL LOW (ref 39.0–52.0)
Hemoglobin: 12.1 g/dL — ABNORMAL LOW (ref 13.0–17.0)
MCH: 31.3 pg (ref 26.0–34.0)
MCHC: 32.1 g/dL (ref 30.0–36.0)
MCV: 97.7 fL (ref 80.0–100.0)
Platelets: 173 10*3/uL (ref 150–400)
RBC: 3.86 MIL/uL — ABNORMAL LOW (ref 4.22–5.81)
RDW: 15.2 % (ref 11.5–15.5)
WBC: 8.1 10*3/uL (ref 4.0–10.5)
nRBC: 0 % (ref 0.0–0.2)

## 2023-05-14 LAB — COMPREHENSIVE METABOLIC PANEL
ALT: 16 U/L (ref 0–44)
AST: 20 U/L (ref 15–41)
Albumin: 3.3 g/dL — ABNORMAL LOW (ref 3.5–5.0)
Alkaline Phosphatase: 70 U/L (ref 38–126)
Anion gap: 6 (ref 5–15)
BUN: 35 mg/dL — ABNORMAL HIGH (ref 8–23)
CO2: 26 mmol/L (ref 22–32)
Calcium: 8.6 mg/dL — ABNORMAL LOW (ref 8.9–10.3)
Chloride: 103 mmol/L (ref 98–111)
Creatinine, Ser: 1.28 mg/dL — ABNORMAL HIGH (ref 0.61–1.24)
GFR, Estimated: 57 mL/min — ABNORMAL LOW (ref >60)
Glucose, Bld: 105 mg/dL — ABNORMAL HIGH (ref 70–99)
Potassium: 4.8 mmol/L (ref 3.5–5.1)
Sodium: 135 mmol/L (ref 135–145)
Total Bilirubin: 0.4 mg/dL (ref 0.3–1.2)
Total Protein: 6.5 g/dL (ref 6.5–8.1)

## 2023-05-14 LAB — I-STAT CHEM 8, ED
BUN: 38 mg/dL — ABNORMAL HIGH (ref 8–23)
Calcium, Ion: 1.16 mmol/L (ref 1.15–1.40)
Chloride: 101 mmol/L (ref 98–111)
Creatinine, Ser: 1.2 mg/dL (ref 0.61–1.24)
Glucose, Bld: 110 mg/dL — ABNORMAL HIGH (ref 70–99)
HCT: 39 % (ref 39.0–52.0)
Hemoglobin: 13.3 g/dL (ref 13.0–17.0)
Potassium: 4.8 mmol/L (ref 3.5–5.1)
Sodium: 137 mmol/L (ref 135–145)
TCO2: 28 mmol/L (ref 22–32)

## 2023-05-14 LAB — SAMPLE TO BLOOD BANK

## 2023-05-14 LAB — PROTIME-INR
INR: 1.3 — ABNORMAL HIGH (ref 0.8–1.2)
Prothrombin Time: 16.1 seconds — ABNORMAL HIGH (ref 11.4–15.2)

## 2023-05-14 LAB — LACTIC ACID, PLASMA: Lactic Acid, Venous: 1 mmol/L (ref 0.5–1.9)

## 2023-05-14 LAB — ETHANOL: Alcohol, Ethyl (B): <10 mg/dL (ref <10)

## 2023-05-14 MED ORDER — IOHEXOL 350 MG/ML SOLN
75.0000 mL | Freq: Once | INTRAVENOUS | Status: AC | PRN
  Administered 2023-05-14: 75 mL via INTRAVENOUS

## 2023-05-14 MED ORDER — ONDANSETRON HCL 4 MG/2ML IJ SOLN
4.0000 mg | Freq: Once | INTRAMUSCULAR | Status: AC
  Administered 2023-05-14: 4 mg via INTRAVENOUS
  Filled 2023-05-14: qty 2

## 2023-05-14 MED ORDER — MORPHINE SULFATE (PF) 4 MG/ML IV SOLN
4.0000 mg | Freq: Once | INTRAVENOUS | Status: AC
  Administered 2023-05-14: 4 mg via INTRAVENOUS
  Filled 2023-05-14: qty 1

## 2023-05-14 MED ORDER — SODIUM CHLORIDE 0.9 % IV SOLN: INTRAVENOUS | Status: DC

## 2023-05-14 MED ORDER — METHOCARBAMOL 500 MG PO TABS: 500.0000 mg | ORAL_TABLET | Freq: Two times a day (BID) | ORAL | 0 refills | Status: DC | PRN

## 2023-05-14 MED ORDER — SODIUM CHLORIDE 0.9 % IV BOLUS
1000.0000 mL | Freq: Once | INTRAVENOUS | Status: AC
  Administered 2023-05-14: 1000 mL via INTRAVENOUS

## 2023-05-14 NOTE — ED Notes (Signed)
Ambulated pt, tolerated well 
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
## 2023-07-04 ENCOUNTER — Institutional Professional Consult (permissible substitution): Payer: Medicare Other | Admitting: Pulmonary Disease

## 2023-07-04 NOTE — Progress Notes (Deleted)
Synopsis: Referred in July 2024 for lung nodule by Dois Davenport, MD  Subjective:   PATIENT ID: Nani Skillern GENDER: male DOB: [birth year], MRN: [id number]  No chief complaint on file.   HPI  ***  Past Medical History:  Diagnosis Date  . Cancer (HCC)   . Carpal tunnel syndrome   . Cervical spondylosis with myelopathy   . Chronic pain syndrome   . Depression   . Difficult intubation    needs smaller tube; awake oral fiberoptic scope 8.0 ETT 03/30/08  . Dysphagia, pharyngeal phase   . Hemorrhage of gastrointestinal tract, unspecified   . History of cardiac cath    a. 03/2018 Cath: LAD and LCX with mild luminal irregularities.  No significant disease.  . Iron deficiency anemia, unspecified   . Lumbosacral spondylosis without myelopathy   . Muscle weakness (generalized)   . Other and unspecified disc disorder of cervical region   . Other and unspecified hyperlipidemia   . Patent foramen ovale    a. 05/2018 Echo: post-op TAVR--> + bublble study w/ L->R shunt.  . Permanent atrial fibrillation (HCC)    a.  Diagnosed in the spring 2019.  Had rapid atrial fibrillation following TAVR and has been rate controlled with beta-blocker.  CHA2DS2VASc equals 7.  Supposed to be on Eliquis.  . Primary localized osteoarthrosis, lower leg   . Primary localized osteoarthrosis, shoulder region   . Reflux esophagitis   . Severe aortic stenosis    a. mild-mod AS by 01/2016 TTE, restricted AV opening with mod AR 01/2016 TEE; b. 10/2017 Echo: EF 60-65%, sev Ca2+ AoV, mean grad ; c. 05/2018 s/p TAVR ([facility]); d. 06/2018 Echo: EF 55-60%, well-seated prosth AoV, peak velocity 156cm/s. AoV gradient .  Marland Kitchen Spinal stenosis, unspecified region other than cervical   . Stroke (HCC)    01/2016  . Syncope and collapse   . Unspecified arthropathy, lower leg   . Unspecified constipation   . Unspecified essential hypertension   . Unspecified glaucoma(365.9)      Family History  Problem Relation Age of  Onset  . Cancer Mother 30       cervical cancer  . Cancer Father 59       lung  cancer     Past Surgical History:  Procedure Laterality Date  . BILATERAL CATARACT SURGERY  2009   DR GROAT   . BIOPSY  10/14/2018   Procedure: BIOPSY;  Surgeon: Graylin Shiver, MD;  Location: [facility] ENDOSCOPY;  Service: Endoscopy;;  . CARDIAC VALVE REPLACEMENT  05/2018  . CERVIACAL SPINE (4X)     DR MARK ROY   . COLONOSCOPY  2007   DR HENSEL   . COLONOSCOPY WITH PROPOFOL N/A 11/20/2017   Procedure: COLONOSCOPY WITH PROPOFOL;  Surgeon: Charlott Rakes, MD;  Location: [facility] ENDOSCOPY;  Service: Endoscopy;  Laterality: N/A;  . ESOPHAGOGASTRODUODENOSCOPY (EGD) WITH PROPOFOL N/A 10/14/2018   Procedure: ESOPHAGOGASTRODUODENOSCOPY (EGD) WITH PROPOFOL;  Surgeon: Graylin Shiver, MD;  Location: [facility] ENDOSCOPY;  Service: Endoscopy;  Laterality: N/A;  . JOINT REPLACEMENT     both knees  . LEFT KNEE REPLACEMENT     DR Despina Hick  . LEFT TRANSVERSE CARPAL LIGAMENT  01/06/2008  . ROTATOR CUFF LEFT SHOULDER  2001   DR MURPHY   . SHOULDER OPEN ROTATOR CUFF REPAIR  2006   DR [facility]  . SPINE SURGERY     neck fusion    Social History   Socioeconomic History  . Marital status: Single  Spouse name: Not on file  . Number of children: 0  . Years of education: GED  . Highest education level: Not on file  Occupational History  . Occupation: retired    Comment: 10 years in service, [address] [employer]  Tobacco Use  . Smoking status: Former    Current packs/day: 0.00    Types: Cigarettes    Quit date: 08/29/1985    Years since quitting: 37.8  . Smokeless tobacco: Former    Quit date: 02/12/1985  Vaping Use  . Vaping status: Never Used  Substance and Sexual Activity  . Alcohol use: No  . Drug use: No  . Sexual activity: Not Currently  Other Topics Concern  . Not on file  Social History Narrative   Lives alone   caffeine use - none   Social Determinants of Health   Financial Resource Strain: Not on file  Food  Insecurity: No Food Insecurity (08/16/2021)   Received from [facility]   Hunger Vital Sign   . Worried About Programme researcher, broadcasting/film/video in the Last Year: Never true   . Ran Out of Food in the Last Year: Never true  Transportation Needs: Not on file  Physical Activity: Not on file  Stress: Not on file  Social Connections: Unknown (04/30/2022)   Received from [facility]   Social Network   . Social Network: Not on file  Intimate Partner Violence: Unknown (03/22/2022)   Received [facility]   HITS   . Physically Hurt: Not on file   . Insult or Talk Down To: Not on file   . Threaten Physical Harm: Not on file   . Scream or Curse: Not on file     Allergies  Allergen Reactions  . Lorazepam Other (See Comments)    Delirium  delirium  Delirium  . Duloxetine Other (See Comments)    Increased depression, irritability  Increased depression, irritability  Increased depression, irritability  Increased depression, irritability  . Pregabalin Other (See Comments) and Swelling    Flashbacks, Bad dreams, Aggressive behavior, swelling  Flashbacks, Bad dreams, Aggressive behavior, swelling  Flashbacks, Bad dreams, Aggressive behavior, swelling  Flashbacks, Bad dreams, Aggressive behavior, swelling  Flashbacks, Bad dreams, Aggressive behavior, swelling  Flashbacks, Bad dreams, Aggressive behavior, swelling  . Zanaflex [Tizanidine Hcl] Anxiety     Outpatient Medications Prior to Visit  Medication Sig Dispense Refill  . apixaban (ELIQUIS) 5 MG TABS tablet Take 1 tablet (5 mg total) by mouth 2 (two) times daily.    Marland Kitchen ascorbic acid (VITAMIN C) 250 MG tablet Take 1 tablet by mouth 2 (two) times daily.    Marland Kitchen aspirin EC 81 MG tablet Take 81 mg by mouth daily.    Marland Kitchen buPROPion (WELLBUTRIN) 100 MG tablet Take 100 mg by mouth 2 (two) times daily.    . cyanocobalamin (,VITAMIN B-12,) 1000 MCG/ML injection Inject 1 mL into the muscle every 28 (twenty-eight) days.    Marland Kitchen diltiazem (CARDIZEM CD) 120 MG  24 hr capsule Take 1 capsule (120 mg total) by mouth daily. 30 capsule 1  . ferrous sulfate 325 (65 FE) MG tablet Take 325 mg by mouth daily.    . fluticasone (FLONASE) 50 MCG/ACT nasal spray Place 1 spray into both nostrils daily as needed for allergies or rhinitis.   2  . furosemide (LASIX) 20 MG tablet Take 20 mg by mouth daily.    Marland Kitchen gabapentin (NEURONTIN) 600 MG tablet Take 600 mg by mouth 3 (three) times daily.    Marland Kitchen  methocarbamol (ROBAXIN) 500 MG tablet Take 1 tablet (500 mg total) by mouth 2 (two) times daily as needed for muscle spasms. 20 tablet 0  . metoprolol succinate (TOPROL-XL) 25 MG 24 hr tablet Take 25 mg by mouth daily.    Marland Kitchen oxyCODONE-acetaminophen (PERCOCET) 10-325 MG tablet Take 1 tablet by mouth every 6 (six) hours as needed.    . pantoprazole (PROTONIX) 40 MG tablet Take 1 tablet (40 mg total) by mouth 2 (two) times daily. 60 tablet 0  . polyethylene glycol (MIRALAX / GLYCOLAX) packet Take 17 g by mouth daily.    . potassium chloride (K-DUR,KLOR-CON) 20 MEQ tablet Take 1 tablet (20 mEq total) by mouth daily. (Patient taking differently: Take 20 mEq by mouth at bedtime.) 30 tablet 5  . rOPINIRole (REQUIP) 0.25 MG tablet Take 0.25 mg by mouth 2 (two) times daily.   3  . simvastatin (ZOCOR) 40 MG tablet Take 40 mg by mouth at bedtime.    Marland Kitchen spironolactone (ALDACTONE) 25 MG tablet Take 25 mg by mouth daily.    . tamsulosin (FLOMAX) 0.4 MG CAPS capsule Take 0.4 mg by mouth daily.     No facility-administered medications prior to visit.    ROS   Objective:  Physical Exam   There were no vitals filed for this visit.   on *** LPM *** RA BMI Readings from Last 3 Encounters:  05/14/23 37.66 kg/m  04/29/23 42.43 kg/m  04/11/23 42.43 kg/m   Wt Readings from Last 3 Encounters:  05/14/23 255 lb (115.7 kg)  04/29/23 255 lb (115.7 kg)  04/11/23 255 lb (115.7 kg)     CBC    Component Value Date/Time   WBC 8.1 05/14/2023 1744   RBC 3.86 (L) 05/14/2023 1744   HGB 13.3  05/14/2023 1753   HCT 39.0 05/14/2023 1753   PLT 173 05/14/2023 1744   MCV 97.7 05/14/2023 1744   MCH 31.3 05/14/2023 1744   MCHC 32.1 05/14/2023 1744   RDW 15.2 05/14/2023 1744   RDW 13.7 03/12/2013 0937   LYMPHSABS 1.3 06/22/2022 1400   LYMPHSABS 1.4 03/12/2013 0937   MONOABS 0.7 06/22/2022 1400   EOSABS 0.3 06/22/2022 1400   EOSABS 0.4 03/12/2013 0937   BASOSABS 0.0 06/22/2022 1400   BASOSABS 0.0 03/12/2013 6387    ***  Chest Imaging: ***  Pulmonary Functions Testing Results:     No data to display          FeNO: ***  Pathology: ***  Echocardiogram: ***  Heart Catheterization: ***    Assessment & Plan:   No diagnosis found.  Discussion: ***   Current Outpatient Medications:  .  apixaban (ELIQUIS) 5 MG TABS tablet, Take 1 tablet (5 mg total) by mouth 2 (two) times daily., Disp: , Rfl:  .  ascorbic acid (VITAMIN C) 250 MG tablet, Take 1 tablet by mouth 2 (two) times daily., Disp: , Rfl:  .  aspirin EC 81 MG tablet, Take 81 mg by mouth daily., Disp: , Rfl:  .  buPROPion (WELLBUTRIN) 100 MG tablet, Take 100 mg by mouth 2 (two) times daily., Disp: , Rfl:  .  cyanocobalamin (,VITAMIN B-12,) 1000 MCG/ML injection, Inject 1 mL into the muscle every 28 (twenty-eight) days., Disp: , Rfl:  .  diltiazem (CARDIZEM CD) 120 MG 24 hr capsule, Take 1 capsule (120 mg total) by mouth daily., Disp: 30 capsule, Rfl: 1 .  ferrous sulfate 325 (65 FE) MG tablet, Take 325 mg by mouth daily., Disp: , Rfl:  .  fluticasone (FLONASE) 50 MCG/ACT nasal spray, Place 1 spray into both nostrils daily as needed for allergies or rhinitis. , Disp: , Rfl: 2 .  furosemide (LASIX) 20 MG tablet, Take 20 mg by mouth daily., Disp: , Rfl:  .  gabapentin (NEURONTIN) 600 MG tablet, Take 600 mg by mouth 3 (three) times daily., Disp: , Rfl:  .  methocarbamol (ROBAXIN) 500 MG tablet, Take 1 tablet (500 mg total) by mouth 2 (two) times daily as needed for muscle spasms., Disp: 20 tablet, Rfl: 0 .   metoprolol succinate (TOPROL-XL) 25 MG 24 hr tablet, Take 25 mg by mouth daily., Disp: , Rfl:  .  oxyCODONE-acetaminophen (PERCOCET) 10-325 MG tablet, Take 1 tablet by mouth every 6 (six) hours as needed., Disp: , Rfl:  .  pantoprazole (PROTONIX) 40 MG tablet, Take 1 tablet (40 mg total) by mouth 2 (two) times daily., Disp: 60 tablet, Rfl: 0 .  polyethylene glycol (MIRALAX / GLYCOLAX) packet, Take 17 g by mouth daily., Disp: , Rfl:  .  potassium chloride (K-DUR,KLOR-CON) 20 MEQ tablet, Take 1 tablet (20 mEq total) by mouth daily. (Patient taking differently: Take 20 mEq by mouth at bedtime.), Disp: 30 tablet, Rfl: 5 .  rOPINIRole (REQUIP) 0.25 MG tablet, Take 0.25 mg by mouth 2 (two) times daily. , Disp: , Rfl: 3 .  simvastatin (ZOCOR) 40 MG tablet, Take 40 mg by mouth at bedtime., Disp: , Rfl:  .  spironolactone (ALDACTONE) 25 MG tablet, Take 25 mg by mouth daily., Disp: , Rfl:  .  tamsulosin (FLOMAX) 0.4 MG CAPS capsule, Take 0.4 mg by mouth daily., Disp: , Rfl:   I spent *** minutes dedicated to the care of this patient on the date of this encounter to include pre-visit review of records, face-to-face time with the patient discussing conditions above, post visit ordering of testing, clinical documentation with the electronic health record, making appropriate referrals as documented, and communicating necessary findings to members of the patients care team.   Josephine Igo, DO [facility] 07/04/2023 7:52 AM

## 2023-08-04 NOTE — Progress Notes (Unsigned)
Synopsis: Referred in August 2024 for pulmonary nodule   By Dois Davenport, MD  Subjective:   PATIENT ID: [name] GENDER: male DOB: September 20, 1941, MRN: [id number]  No chief complaint on file.   this is an 82 year old gentleman, past medical history of atrial fibrillation, severe aortic stenosis status post TAVR in 2019, chronic pain syndrome, arthritis.  Patient was referred after having CT scan of the chest for pulmonary nodules.Patient had CT scan of the chest completed in May 2024.  This was completed during recent hospitalization in the emergency room.  Patient was found to have several inflammatory appearing nodules 1 with early mild cavitation concerning for pneumonia versus septic emboli in May 2024.    Past Medical History:  Diagnosis Date   Cancer [facility])    Carpal tunnel syndrome    Cervical spondylosis with myelopathy    Chronic pain syndrome    Depression    Difficult intubation    needs smaller tube; awake oral fiberoptic scope 8.0 ETT 03/30/08   Dysphagia, pharyngeal phase    Hemorrhage of gastrointestinal tract, unspecified    History of cardiac cath    a. 03/2018 Cath: LAD and LCX with mild luminal irregularities.  No significant disease.   Iron deficiency anemia, unspecified    Lumbosacral spondylosis without myelopathy    Muscle weakness (generalized)    Other and unspecified disc disorder of cervical region    Other and unspecified hyperlipidemia    Patent foramen ovale    a. 05/2018 Echo: post-op TAVR--> + bublble study w/ L->R shunt.   Permanent atrial fibrillation (HCC)    a.  Diagnosed in the spring 2019.  Had rapid atrial fibrillation following TAVR and has been rate controlled with beta-blocker.  CHA2DS2VASc equals 7.  Supposed to be on Eliquis.   Primary localized osteoarthrosis, lower leg    Primary localized osteoarthrosis, shoulder region    Reflux esophagitis    Severe aortic stenosis    a. mild-mod AS by 01/2016 TTE, restricted AV opening with  mod AR 01/2016 TEE; b. 10/2017 Echo: EF 60-65%, sev Ca2+ AoV, mean grad ; c. 05/2018 s/p TAVR ([facility]); d. 06/2018 Echo: EF 55-60%, well-seated prosth AoV, peak velocity 156cm/s. AoV gradient .   Spinal stenosis, unspecified region other than cervical    Stroke (HCC)    01/2016   Syncope and collapse    Unspecified arthropathy, lower leg    Unspecified constipation    Unspecified essential hypertension    Unspecified glaucoma(365.9)      Family History  Problem Relation Age of Onset   Cancer Mother 14       cervical cancer   Cancer Father 24       lung  cancer     Past Surgical History:  Procedure Laterality Date   BILATERAL CATARACT SURGERY  2009   DR GROAT    BIOPSY  10/14/2018   Procedure: BIOPSY;  Surgeon: Graylin Shiver, MD;  Location: [facility] ENDOSCOPY;  Service: Endoscopy;;   CARDIAC VALVE REPLACEMENT  05/2018   CERVIACAL SPINE (4X)     DR MARK ROY    COLONOSCOPY  2007   DR HENSEL    COLONOSCOPY WITH PROPOFOL N/A 11/20/2017   Procedure: COLONOSCOPY WITH PROPOFOL;  Surgeon: Charlott Rakes, MD;  Location: [facility] ENDOSCOPY;  Service: Endoscopy;  Laterality: N/A;   ESOPHAGOGASTRODUODENOSCOPY (EGD) WITH PROPOFOL N/A 10/14/2018   Procedure: ESOPHAGOGASTRODUODENOSCOPY (EGD) WITH PROPOFOL;  Surgeon: Graylin Shiver, MD;  Location: [facility] ENDOSCOPY;  Service: Endoscopy;  Laterality: N/A;   JOINT REPLACEMENT     both knees   LEFT KNEE REPLACEMENT     DR ALUSIO   LEFT TRANSVERSE CARPAL LIGAMENT  01/06/2008   ROTATOR CUFF LEFT SHOULDER  2001   DR MURPHY    SHOULDER OPEN ROTATOR CUFF REPAIR  2006   DR [facility]   SPINE SURGERY     neck fusion    Social History   Socioeconomic History   Marital status: Single    Spouse name: Not on file   Number of children: 0   Years of education: GED   Highest education level: Not on file  Occupational History   Occupation: retired    Comment: 10 years in service, [employer]  Tobacco Use   Smoking status: Former    Current packs/day:  0.00    Types: Cigarettes    Quit date: 08/29/1985    Years since quitting: 37.9   Smokeless tobacco: Former    Quit date: 02/12/1985  Vaping Use   Vaping status: Never Used  Substance and Sexual Activity   Alcohol use: No   Drug use: No   Sexual activity: Not Currently  Other Topics Concern   Not on file  Social History Narrative   Lives alone   caffeine use - none   Social Determinants of Health   Financial Resource Strain: Not on file  Food Insecurity: No Food Insecurity (08/16/2021)   Received from [facility], [facility]   Hunger Vital Sign    Worried About Running Out of Food in the Last Year: Never true    Ran Out of Food in the Last Year: Never true  Transportation Needs: Not on file  Physical Activity: Not on file  Stress: Not on file  Social Connections: Unknown (04/30/2022)   Received from [facility], [facility]   Social Network    Social Network: Not on file  Intimate Partner Violence: Unknown (03/22/2022)   Received from [employer], [facility]   HITS    Physically Hurt: Not on file    Insult or Talk Down To: Not on file    Threaten Physical Harm: Not on file    Scream or Curse: Not on file     Allergies  Allergen Reactions   Lorazepam Other (See Comments)    Delirium  delirium  Delirium   Duloxetine Other (See Comments)    Increased depression, irritability  Increased depression, irritability  Increased depression, irritability  Increased depression, irritability   Pregabalin Other (See Comments) and Swelling    Flashbacks, Bad dreams, Aggressive behavior, swelling  Flashbacks, Bad dreams, Aggressive behavior, swelling  Flashbacks, Bad dreams, Aggressive behavior, swelling  Flashbacks, Bad dreams, Aggressive behavior, swelling  Flashbacks, Bad dreams, Aggressive behavior, swelling  Flashbacks, Bad dreams, Aggressive behavior, swelling   Zanaflex [Tizanidine Hcl] Anxiety     Outpatient Medications Prior to Visit  Medication Sig  Dispense Refill   apixaban (ELIQUIS) 5 MG TABS tablet Take 1 tablet (5 mg total) by mouth 2 (two) times daily.     ascorbic acid (VITAMIN C) 250 MG tablet Take 1 tablet by mouth 2 (two) times daily.     aspirin EC 81 MG tablet Take 81 mg by mouth daily.     buPROPion (WELLBUTRIN) 100 MG tablet Take 100 mg by mouth 2 (two) times daily.     cyanocobalamin (,VITAMIN B-12,) 1000 MCG/ML injection Inject 1 mL into the muscle every 28 (twenty-eight) days.     diltiazem (CARDIZEM CD)  120 MG 24 hr capsule Take 1 capsule (120 mg total) by mouth daily. 30 capsule 1   ferrous sulfate 325 (65 FE) MG tablet Take 325 mg by mouth daily.     fluticasone (FLONASE) 50 MCG/ACT nasal spray Place 1 spray into both nostrils daily as needed for allergies or rhinitis.   2   furosemide (LASIX) 20 MG tablet Take 20 mg by mouth daily.     gabapentin (NEURONTIN) 600 MG tablet Take 600 mg by mouth 3 (three) times daily.     methocarbamol (ROBAXIN) 500 MG tablet Take 1 tablet (500 mg total) by mouth 2 (two) times daily as needed for muscle spasms. 20 tablet 0   metoprolol succinate (TOPROL-XL) 25 MG 24 hr tablet Take 25 mg by mouth daily.     oxyCODONE-acetaminophen (PERCOCET) 10-325 MG tablet Take 1 tablet by mouth every 6 (six) hours as needed.     pantoprazole (PROTONIX) 40 MG tablet Take 1 tablet (40 mg total) by mouth 2 (two) times daily. 60 tablet 0   polyethylene glycol (MIRALAX / GLYCOLAX) packet Take 17 g by mouth daily.     potassium chloride (K-DUR,KLOR-CON) 20 MEQ tablet Take 1 tablet (20 mEq total) by mouth daily. (Patient taking differently: Take 20 mEq by mouth at bedtime.) 30 tablet 5   rOPINIRole (REQUIP) 0.25 MG tablet Take 0.25 mg by mouth 2 (two) times daily.   3   simvastatin (ZOCOR) 40 MG tablet Take 40 mg by mouth at bedtime.     spironolactone (ALDACTONE) 25 MG tablet Take 25 mg by mouth daily.     tamsulosin (FLOMAX) 0.4 MG CAPS capsule Take 0.4 mg by mouth daily.     No facility-administered  medications prior to visit.    ROS   Objective:  Physical Exam   There were no vitals filed for this visit.   on *** LPM *** RA BMI Readings from Last 3 Encounters:  05/14/23 37.66 kg/m  04/29/23 42.43 kg/m  04/11/23 42.43 kg/m   Wt Readings from Last 3 Encounters:  05/14/23 255 lb (115.7 kg)  04/29/23 255 lb (115.7 kg)  04/11/23 255 lb (115.7 kg)     CBC    Component Value Date/Time   WBC 8.1 05/14/2023 1744   RBC 3.86 (L) 05/14/2023 1744   HGB 13.3 05/14/2023 1753   HCT 39.0 05/14/2023 1753   PLT 173 05/14/2023 1744   MCV 97.7 05/14/2023 1744   MCH 31.3 05/14/2023 1744   MCHC 32.1 05/14/2023 1744   RDW 15.2 05/14/2023 1744   RDW 13.7 03/12/2013 0937   LYMPHSABS 1.3 06/22/2022 1400   LYMPHSABS 1.4 03/12/2013 0937   MONOABS 0.7 06/22/2022 1400   EOSABS 0.3 06/22/2022 1400   EOSABS 0.4 03/12/2013 0937   BASOSABS 0.0 06/22/2022 1400   BASOSABS 0.0 03/12/2013 3086      Chest Imaging: CT chest May 2024: Several small inflammatory lesions seen on CT imaging during recent ER visit in May. The patient's images have been independently reviewed by me.    Pulmonary Functions Testing Results:     No data to display          FeNO:   Pathology:   Echocardiogram:   Heart Catheterization:     Assessment & Plan:     ICD-10-CM   1. Multiple pulmonary nodules  R91.8       Discussion: ***   Current Outpatient Medications:    apixaban (ELIQUIS) 5 MG TABS tablet, Take 1 tablet (5 mg  total) by mouth 2 (two) times daily., Disp: , Rfl:    ascorbic acid (VITAMIN C) 250 MG tablet, Take 1 tablet by mouth 2 (two) times daily., Disp: , Rfl:    aspirin EC 81 MG tablet, Take 81 mg by mouth daily., Disp: , Rfl:    buPROPion (WELLBUTRIN) 100 MG tablet, Take 100 mg by mouth 2 (two) times daily., Disp: , Rfl:    cyanocobalamin (,VITAMIN B-12,) 1000 MCG/ML injection, Inject 1 mL into the muscle every 28 (twenty-eight) days., Disp: , Rfl:    diltiazem (CARDIZEM CD)  120 MG 24 hr capsule, Take 1 capsule (120 mg total) by mouth daily., Disp: 30 capsule, Rfl: 1   ferrous sulfate 325 (65 FE) MG tablet, Take 325 mg by mouth daily., Disp: , Rfl:    fluticasone (FLONASE) 50 MCG/ACT nasal spray, Place 1 spray into both nostrils daily as needed for allergies or rhinitis. , Disp: , Rfl: 2   furosemide (LASIX) 20 MG tablet, Take 20 mg by mouth daily., Disp: , Rfl:    gabapentin (NEURONTIN) 600 MG tablet, Take 600 mg by mouth 3 (three) times daily., Disp: , Rfl:    methocarbamol (ROBAXIN) 500 MG tablet, Take 1 tablet (500 mg total) by mouth 2 (two) times daily as needed for muscle spasms., Disp: 20 tablet, Rfl: 0   metoprolol succinate (TOPROL-XL) 25 MG 24 hr tablet, Take 25 mg by mouth daily., Disp: , Rfl:    oxyCODONE-acetaminophen (PERCOCET) 10-325 MG tablet, Take 1 tablet by mouth every 6 (six) hours as needed., Disp: , Rfl:    pantoprazole (PROTONIX) 40 MG tablet, Take 1 tablet (40 mg total) by mouth 2 (two) times daily., Disp: 60 tablet, Rfl: 0   polyethylene glycol (MIRALAX / GLYCOLAX) packet, Take 17 g by mouth daily., Disp: , Rfl:    potassium chloride (K-DUR,KLOR-CON) 20 MEQ tablet, Take 1 tablet (20 mEq total) by mouth daily. (Patient taking differently: Take 20 mEq by mouth at bedtime.), Disp: 30 tablet, Rfl: 5   rOPINIRole (REQUIP) 0.25 MG tablet, Take 0.25 mg by mouth 2 (two) times daily. , Disp: , Rfl: 3   simvastatin (ZOCOR) 40 MG tablet, Take 40 mg by mouth at bedtime., Disp: , Rfl:    spironolactone (ALDACTONE) 25 MG tablet, Take 25 mg by mouth daily., Disp: , Rfl:    tamsulosin (FLOMAX) 0.4 MG CAPS capsule, Take 0.4 mg by mouth daily., Disp: , Rfl:   I spent *** minutes dedicated to the care of this patient on the date of this encounter to include pre-visit review of records, face-to-face time with the patient discussing conditions above, post visit ordering of testing, clinical documentation with the electronic health record, making appropriate referrals  as documented, and communicating necessary findings to members of the patients care team.   Josephine Igo, DO [facility] 08/04/2023 12:14 PM

## 2023-08-05 ENCOUNTER — Ambulatory Visit: Payer: Medicare Other | Admitting: Pulmonary Disease

## 2023-08-05 ENCOUNTER — Encounter: Payer: Self-pay | Admitting: Pulmonary Disease

## 2023-08-05 VITALS — BP 100/66 | HR 70 | Ht 69.0 in | Wt 249.0 lb

## 2023-08-05 DIAGNOSIS — R918 Other nonspecific abnormal finding of lung field: Secondary | ICD-10-CM | POA: Diagnosis not present

## 2023-08-05 DIAGNOSIS — Z87891 Personal history of nicotine dependence: Secondary | ICD-10-CM | POA: Diagnosis not present

## 2023-08-05 NOTE — Patient Instructions (Signed)
Thank you for visiting Dr. Tonia Brooms at [facility]. Today we recommend the following:  Orders Placed This Encounter  Procedures   CT Chest Wo Contrast   Return if symptoms worsen or fail to improve, for with APP, after CT Chest.    Please do your part to reduce the spread of COVID-19.

## 2023-08-09 ENCOUNTER — Other Ambulatory Visit: Payer: Self-pay | Admitting: Family Medicine

## 2023-08-09 ENCOUNTER — Ambulatory Visit
Admission: RE | Admit: 2023-08-09 | Discharge: 2023-08-09 | Disposition: A | Discharge status: Home / Self Care | Payer: Medicare Other | Source: Ambulatory Visit | Attending: Family Medicine | Admitting: Family Medicine

## 2023-08-09 ENCOUNTER — Ambulatory Visit (HOSPITAL_BASED_OUTPATIENT_CLINIC_OR_DEPARTMENT_OTHER)
Admission: RE | Admit: 2023-08-09 | Discharge: 2023-08-09 | Disposition: A | Discharge status: Home / Self Care | Payer: Medicare Other | Source: Ambulatory Visit | Attending: Pulmonary Disease | Admitting: Pulmonary Disease

## 2023-08-09 DIAGNOSIS — R918 Other nonspecific abnormal finding of lung field: Secondary | ICD-10-CM | POA: Diagnosis present

## 2023-08-09 DIAGNOSIS — M533 Sacrococcygeal disorders, not elsewhere classified: Secondary | ICD-10-CM

## 2023-08-22 ENCOUNTER — Encounter: Payer: Self-pay | Admitting: Pulmonary Disease

## 2023-08-22 ENCOUNTER — Ambulatory Visit (INDEPENDENT_AMBULATORY_CARE_PROVIDER_SITE_OTHER): Payer: Medicare Other | Admitting: Pulmonary Disease

## 2023-08-22 VITALS — BP 100/60 | HR 84 | Ht 69.0 in | Wt 239.4 lb

## 2023-08-22 DIAGNOSIS — J690 Pneumonitis due to inhalation of food and vomit: Secondary | ICD-10-CM | POA: Diagnosis not present

## 2023-08-22 DIAGNOSIS — R918 Other nonspecific abnormal finding of lung field: Secondary | ICD-10-CM | POA: Diagnosis not present

## 2023-08-22 DIAGNOSIS — R1319 Other dysphagia: Secondary | ICD-10-CM

## 2023-08-22 NOTE — Patient Instructions (Signed)
Thank you for visiting Dr. Tonia Brooms at [facility]. Today we recommend the following:  Orders Placed This Encounter  Procedures   CT CHEST WO CONTRAST   Return in about 15 weeks (around 12/05/2023) for with APP, after CT Chest.    Please do your part to reduce the spread of COVID-19.

## 2023-08-22 NOTE — Progress Notes (Signed)
Synopsis: Referred in August 2024 for pulmonary nodule   By Dois Davenport, MD  Subjective:   PATIENT ID: [name] GENDER: male DOB: Jul 30, 1941, MRN: [id number]  Chief Complaint  Patient presents with   Follow-up   this is an 82 year old gentleman, past medical history of atrial fibrillation, severe aortic stenosis status post TAVR in 2019, chronic pain syndrome, arthritis.  Patient was referred after having CT scan of the chest for pulmonary nodules.Patient had CT scan of the chest completed in May 2024.  This was completed during recent hospitalization in the emergency room.  Patient was found to have several inflammatory appearing nodules 1 with early mild cavitation concerning for pneumonia versus septic emboli in May 2024. Patient has no respiratory complaints. He lives with his sister. In a wheelchair and walks with a cane.   OV 08/22/2023: Here today for follow-up after recent CT scan of the chest.Patient was seen initially for evaluation after repeat CT.  He is 82 years old with multiple medical comorbidities.  Chronic pain, former smoker.Patient had a August CT scan from 08/09/2023 completed that showed the previous patchy poorly marginated pulmonary nodules with groundglass halos had resolved.  Had numerous similar patchy small nodules that were present throughout the left lower lobe.  Suggestive of recurrent aspiration.When the patient was counseled on swallowing dysfunction he does state that sometimes food gets stuck when he swallows right at the entrance of his thorax.    Past Medical History:  Diagnosis Date   Cancer [facility])    Carpal tunnel syndrome    Cervical spondylosis with myelopathy    Chronic pain syndrome    Depression    Difficult intubation    needs smaller tube; awake oral fiberoptic scope 8.0 ETT 03/30/08   Dysphagia, pharyngeal phase    Hemorrhage of gastrointestinal tract, unspecified    History of cardiac cath    a. 03/2018 Cath: LAD and LCX with mild  luminal irregularities.  No significant disease.   Iron deficiency anemia, unspecified    Lumbosacral spondylosis without myelopathy    Muscle weakness (generalized)    Other and unspecified disc disorder of cervical region    Other and unspecified hyperlipidemia    Patent foramen ovale    a. 05/2018 Echo: post-op TAVR--> + bublble study w/ L->R shunt.   Permanent atrial fibrillation (HCC)    a.  Diagnosed in the spring 2019.  Had rapid atrial fibrillation following TAVR and has been rate controlled with beta-blocker.  CHA2DS2VASc equals 7.  Supposed to be on Eliquis.   Primary localized osteoarthrosis, lower leg    Primary localized osteoarthrosis, shoulder region    Reflux esophagitis    Severe aortic stenosis    a. mild-mod AS by 01/2016 TTE, restricted AV opening with mod AR 01/2016 TEE; b. 10/2017 Echo: EF 60-65%, sev Ca2+ AoV, mean grad ; c. 05/2018 s/p TAVR ([facility]); d. 06/2018 Echo: EF 55-60%, well-seated prosth AoV, peak velocity 156cm/s. AoV gradient .   Spinal stenosis, unspecified region other than cervical    Stroke (HCC)    01/2016   Syncope and collapse    Unspecified arthropathy, lower leg    Unspecified constipation    Unspecified essential hypertension    Unspecified glaucoma(365.9)      Family History  Problem Relation Age of Onset   Cancer Mother 65       cervical cancer   Cancer Father 31       lung  cancer  Past Surgical History:  Procedure Laterality Date   BILATERAL CATARACT SURGERY  2009   DR GROAT    BIOPSY  10/14/2018   Procedure: BIOPSY;  Surgeon: Graylin Shiver, MD;  Location: [facility] ENDOSCOPY;  Service: Endoscopy;;   CARDIAC VALVE REPLACEMENT  05/2018   CERVIACAL SPINE (4X)     DR MARK ROY    COLONOSCOPY  2007   DR HENSEL    COLONOSCOPY WITH PROPOFOL N/A 11/20/2017   Procedure: COLONOSCOPY WITH PROPOFOL;  Surgeon: Charlott Rakes, MD;  Location: [facility] ENDOSCOPY;  Service: Endoscopy;  Laterality: N/A;   ESOPHAGOGASTRODUODENOSCOPY (EGD) WITH  PROPOFOL N/A 10/14/2018   Procedure: ESOPHAGOGASTRODUODENOSCOPY (EGD) WITH PROPOFOL;  Surgeon: Graylin Shiver, MD;  Location: [facility] ENDOSCOPY;  Service: Endoscopy;  Laterality: N/A;   JOINT REPLACEMENT     both knees   LEFT KNEE REPLACEMENT     DR ALUSIO   LEFT TRANSVERSE CARPAL LIGAMENT  01/06/2008   ROTATOR CUFF LEFT SHOULDER  2001   DR MURPHY    SHOULDER OPEN ROTATOR CUFF REPAIR  2006   DR [facility]   SPINE SURGERY     neck fusion    Social History   Socioeconomic History   Marital status: Single    Spouse name: Not on file   Number of children: 0   Years of education: GED   Highest education level: Not on file  Occupational History   Occupation: retired    Comment: 10 years in service, [employer]  Tobacco Use   Smoking status: Former    Current packs/day: 0.00    Types: Cigarettes    Quit date: 08/29/1985    Years since quitting: 38.0   Smokeless tobacco: Former    Quit date: 02/12/1985  Vaping Use   Vaping status: Never Used  Substance and Sexual Activity   Alcohol use: No   Drug use: No   Sexual activity: Not Currently  Other Topics Concern   Not on file  Social History Narrative   Lives alone   caffeine use - none   Social Determinants of Health   Financial Resource Strain: Not on file  Food Insecurity: No Food Insecurity (08/16/2021)   [facility]   Hunger Vital Sign    Worried About Running Out of Food in the Last Year: Never true    Ran Out of Food in the Last Year: Never true  Transportation Needs: Not on file  Physical Activity: Not on file  Stress: Not on file  Social Connections: Unknown (04/30/2022)   Received from [facility], [facility]   Social Network    Social Network: Not on file  Intimate Partner Violence: Unknown (03/22/2022)   Received from [facility], [facility]   HITS    Physically Hurt: Not on file    Insult or Talk Down To: Not on file    Threaten Physical Harm: Not on file    Scream or  Curse: Not on file     Allergies  Allergen Reactions   Lorazepam Other (See Comments)    Delirium  delirium  Delirium   Duloxetine Other (See Comments)    Increased depression, irritability  Increased depression, irritability  Increased depression, irritability  Increased depression, irritability   Pregabalin Other (See Comments) and Swelling    Flashbacks, Bad dreams, Aggressive behavior, swelling  Flashbacks, Bad dreams, Aggressive behavior, swelling  Flashbacks, Bad dreams, Aggressive behavior, swelling  Flashbacks, Bad dreams, Aggressive behavior, swelling  Flashbacks, Bad dreams, Aggressive behavior,  swelling  Flashbacks, Bad dreams, Aggressive behavior, swelling   Zanaflex [Tizanidine Hcl] Anxiety     Outpatient Medications Prior to Visit  Medication Sig Dispense Refill   apixaban (ELIQUIS) 5 MG TABS tablet Take 1 tablet (5 mg total) by mouth 2 (two) times daily.     ascorbic acid (VITAMIN C) 250 MG tablet Take 1 tablet by mouth 2 (two) times daily.     aspirin EC 81 MG tablet Take 81 mg by mouth daily.     buPROPion (WELLBUTRIN) 100 MG tablet Take 100 mg by mouth 2 (two) times daily.     cyanocobalamin (,VITAMIN B-12,) 1000 MCG/ML injection Inject 1 mL into the muscle every 28 (twenty-eight) days.     diltiazem (CARDIZEM CD) 120 MG 24 hr capsule Take 1 capsule (120 mg total) by mouth daily. 30 capsule 1   ferrous sulfate 325 (65 FE) MG tablet Take 325 mg by mouth daily.     fluticasone (FLONASE) 50 MCG/ACT nasal spray Place 1 spray into both nostrils daily as needed for allergies or rhinitis.   2   furosemide (LASIX) 20 MG tablet Take 20 mg by mouth daily.     gabapentin (NEURONTIN) 600 MG tablet Take 600 mg by mouth 3 (three) times daily.     methocarbamol (ROBAXIN) 500 MG tablet Take 1 tablet (500 mg total) by mouth 2 (two) times daily as needed for muscle spasms. 20 tablet 0   metoprolol succinate (TOPROL-XL) 25 MG 24 hr tablet Take 25 mg by mouth daily.      oxyCODONE-acetaminophen (PERCOCET) 10-325 MG tablet Take 1 tablet by mouth every 6 (six) hours as needed.     pantoprazole (PROTONIX) 40 MG tablet Take 1 tablet (40 mg total) by mouth 2 (two) times daily. 60 tablet 0   polyethylene glycol (MIRALAX / GLYCOLAX) packet Take 17 g by mouth daily.     potassium chloride (K-DUR,KLOR-CON) 20 MEQ tablet Take 1 tablet (20 mEq total) by mouth daily. (Patient taking differently: Take 20 mEq by mouth at bedtime.) 30 tablet 5   rOPINIRole (REQUIP) 0.25 MG tablet Take 0.25 mg by mouth 2 (two) times daily.   3   simvastatin (ZOCOR) 40 MG tablet Take 40 mg by mouth at bedtime.     spironolactone (ALDACTONE) 25 MG tablet Take 25 mg by mouth daily.     tamsulosin (FLOMAX) 0.4 MG CAPS capsule Take 0.4 mg by mouth daily.     No facility-administered medications prior to visit.    Review of Systems  Constitutional:  Negative for chills, fever, malaise/fatigue and weight loss.  HENT:  Negative for hearing loss, sore throat and tinnitus.   Eyes:  Negative for blurred vision and double vision.  Respiratory:  Positive for cough. Negative for hemoptysis, sputum production, shortness of breath, wheezing and stridor.   Cardiovascular:  Negative for chest pain, palpitations, orthopnea, leg swelling and PND.  Gastrointestinal:  Negative for abdominal pain, constipation, diarrhea, heartburn, nausea and vomiting.  Genitourinary:  Negative for dysuria, hematuria and urgency.  Musculoskeletal:  Negative for joint pain and myalgias.  Skin:  Negative for itching and rash.  Neurological:  Negative for dizziness, tingling, weakness and headaches.  Endo/Heme/Allergies:  Negative for environmental allergies. Does not bruise/bleed easily.  Psychiatric/Behavioral:  Negative for depression. The patient is not nervous/anxious and does not have insomnia.   All other systems reviewed and are negative.    Objective:  Physical Exam Vitals reviewed.  Constitutional:      General: He  is not in acute distress.    Appearance: He is well-developed. He is obese.  HENT:     Head: Normocephalic and atraumatic.  Eyes:     General: No scleral icterus.    Conjunctiva/sclera: Conjunctivae normal.     Pupils: Pupils are equal, round, and reactive to light.  Neck:     Vascular: No JVD.     Trachea: No tracheal deviation.  Cardiovascular:     Rate and Rhythm: Normal rate and regular rhythm.     Heart sounds: Normal heart sounds. No murmur heard. Pulmonary:     Effort: Pulmonary effort is normal. No tachypnea, accessory muscle usage or respiratory distress.     Breath sounds: No stridor. No wheezing, rhonchi or rales.  Abdominal:     General: There is no distension.     Palpations: Abdomen is soft.     Tenderness: There is no abdominal tenderness.  Musculoskeletal:        General: No tenderness.     Cervical back: Neck supple.  Lymphadenopathy:     Cervical: No cervical adenopathy.  Skin:    General: Skin is warm and dry.     Capillary Refill: Capillary refill takes less than 2 seconds.     Findings: No rash.  Neurological:     Mental Status: He is alert and oriented to person, place, and time.  Psychiatric:        Behavior: Behavior normal.      Vitals:   08/22/23 1317  BP: 100/60  Pulse: 84  SpO2: 94%  Weight: 239 lb 6.4 oz (108.6 kg)  Height: 5\' 9"  (1.753 m)   94% on RA BMI Readings from Last 3 Encounters:  08/22/23 35.35 kg/m  08/05/23 36.77 kg/m  05/14/23 37.66 kg/m   Wt Readings from Last 3 Encounters:  08/22/23 239 lb 6.4 oz (108.6 kg)  08/05/23 249 lb (112.9 kg)  05/14/23 255 lb (115.7 kg)     CBC    Component Value Date/Time   WBC 8.1 05/14/2023 1744   RBC 3.86 (L) 05/14/2023 1744   HGB 13.3 05/14/2023 1753   HCT 39.0 05/14/2023 1753   PLT 173 05/14/2023 1744   MCV 97.7 05/14/2023 1744   MCH 31.3 05/14/2023 1744   MCHC 32.1 05/14/2023 1744   RDW 15.2 05/14/2023 1744   RDW 13.7 03/12/2013 0937   LYMPHSABS 1.3 06/22/2022 1400    LYMPHSABS 1.4 03/12/2013 0937   MONOABS 0.7 06/22/2022 1400   EOSABS 0.3 06/22/2022 1400   EOSABS 0.4 03/12/2013 0937   BASOSABS 0.0 06/22/2022 1400   BASOSABS 0.0 03/12/2013 4098    Chest Imaging: CT chest May 2024: Several small inflammatory lesions seen on CT imaging during recent ER visit in May. The patient's images have been independently reviewed by me.    Pulmonary Functions Testing Results:     No data to display          FeNO:   Pathology:   Echocardiogram:   Heart Catheterization:     Assessment & Plan:     ICD-10-CM   1. Aspiration pneumonia of both lower lobes, unspecified aspiration pneumonia type (HCC)  J69.0     2. Lung nodules  R91.8 CT CHEST WO CONTRAST    SLP modified barium swallow    DG Swallowing Func-Speech Pathology    3. Esophageal dysphagia  R13.19     4. Aspiration pneumonia of both lower lobes due to gastric secretions (HCC)  J69.0 DG Swallowing Func-Speech Pathology  Discussion:  This is a 82 year old gentleman multiple medical comorbidities, chronic pain, former smoker had a CT scan from ER visit in May 2024.  Revealed to have multiple small pulmonary nodules had subsequent CT follow-up in August to 2024 that shows resolution of those however he does have a few more groundglass lesions within the left lower lobe.  Plan: Patient will need a repeat noncontrasted CT chest in 3 months. I have ordered a MBSS and swallow evaluation by SLP. Sent a message over to one of my GI colleague Robynn Pane to take a look at his CT imaging because I feel as if potentially his cervical hardware from a previous spine surgery could be causing some of his difficulty with swallowing as it does appear to crowd the space of the esophagus.    Current Outpatient Medications:    apixaban (ELIQUIS) 5 MG TABS tablet, Take 1 tablet (5 mg total) by mouth 2 (two) times daily., Disp: , Rfl:    ascorbic acid (VITAMIN C) 250 MG tablet, Take 1 tablet by mouth 2 (two)  times daily., Disp: , Rfl:    aspirin EC 81 MG tablet, Take 81 mg by mouth daily., Disp: , Rfl:    buPROPion (WELLBUTRIN) 100 MG tablet, Take 100 mg by mouth 2 (two) times daily., Disp: , Rfl:    cyanocobalamin (,VITAMIN B-12,) 1000 MCG/ML injection, Inject 1 mL into the muscle every 28 (twenty-eight) days., Disp: , Rfl:    diltiazem (CARDIZEM CD) 120 MG 24 hr capsule, Take 1 capsule (120 mg total) by mouth daily., Disp: 30 capsule, Rfl: 1   ferrous sulfate 325 (65 FE) MG tablet, Take 325 mg by mouth daily., Disp: , Rfl:    fluticasone (FLONASE) 50 MCG/ACT nasal spray, Place 1 spray into both nostrils daily as needed for allergies or rhinitis. , Disp: , Rfl: 2   furosemide (LASIX) 20 MG tablet, Take 20 mg by mouth daily., Disp: , Rfl:    gabapentin (NEURONTIN) 600 MG tablet, Take 600 mg by mouth 3 (three) times daily., Disp: , Rfl:    methocarbamol (ROBAXIN) 500 MG tablet, Take 1 tablet (500 mg total) by mouth 2 (two) times daily as needed for muscle spasms., Disp: 20 tablet, Rfl: 0   metoprolol succinate (TOPROL-XL) 25 MG 24 hr tablet, Take 25 mg by mouth daily., Disp: , Rfl:    oxyCODONE-acetaminophen (PERCOCET) 10-325 MG tablet, Take 1 tablet by mouth every 6 (six) hours as needed., Disp: , Rfl:    pantoprazole (PROTONIX) 40 MG tablet, Take 1 tablet (40 mg total) by mouth 2 (two) times daily., Disp: 60 tablet, Rfl: 0   polyethylene glycol (MIRALAX / GLYCOLAX) packet, Take 17 g by mouth daily., Disp: , Rfl:    potassium chloride (K-DUR,KLOR-CON) 20 MEQ tablet, Take 1 tablet (20 mEq total) by mouth daily. (Patient taking differently: Take 20 mEq by mouth at bedtime.), Disp: 30 tablet, Rfl: 5   rOPINIRole (REQUIP) 0.25 MG tablet, Take 0.25 mg by mouth 2 (two) times daily. , Disp: , Rfl: 3   simvastatin (ZOCOR) 40 MG tablet, Take 40 mg by mouth at bedtime., Disp: , Rfl:    spironolactone (ALDACTONE) 25 MG tablet, Take 25 mg by mouth daily., Disp: , Rfl:    tamsulosin (FLOMAX) 0.4 MG CAPS capsule,  Take 0.4 mg by mouth daily., Disp: , Rfl:    Josephine Igo, DO [facility] 08/22/2023 1:59 PM

## 2023-08-22 NOTE — Addendum Note (Signed)
Addended by: Hedda Slade on: 08/22/2023 02:37 PM   Modules accepted: Orders

## 2023-09-04 ENCOUNTER — Ambulatory Visit (HOSPITAL_COMMUNITY)
Admission: RE | Admit: 2023-09-04 | Discharge: 2023-09-04 | Disposition: A | Discharge status: Home / Self Care | Payer: Medicare Other | Source: Ambulatory Visit | Attending: Pulmonary Disease | Admitting: Pulmonary Disease

## 2023-09-04 ENCOUNTER — Ambulatory Visit (HOSPITAL_COMMUNITY)
Admission: RE | Admit: 2023-09-04 | Discharge: 2023-09-04 | Disposition: A | Discharge status: Home / Self Care | Payer: Medicare Other | Source: Ambulatory Visit | Attending: Pulmonary Disease

## 2023-09-04 DIAGNOSIS — K224 Dyskinesia of esophagus: Secondary | ICD-10-CM | POA: Insufficient documentation

## 2023-09-04 DIAGNOSIS — R918 Other nonspecific abnormal finding of lung field: Secondary | ICD-10-CM

## 2023-09-04 DIAGNOSIS — J690 Pneumonitis due to inhalation of food and vomit: Secondary | ICD-10-CM

## 2023-09-04 DIAGNOSIS — K219 Gastro-esophageal reflux disease without esophagitis: Secondary | ICD-10-CM | POA: Diagnosis not present

## 2023-09-04 DIAGNOSIS — R1310 Dysphagia, unspecified: Secondary | ICD-10-CM | POA: Diagnosis present

## 2023-09-04 NOTE — Progress Notes (Signed)
Patient has esophageal dysmotility. Please place a referral to gastroenterology   Thanks,  BLI  Josephine Igo, DO [facility] 09/04/2023 12:32 PM

## 2023-09-04 NOTE — Progress Notes (Signed)
Modified Barium Swallow Study  Patient Details  Name: [name] MRN: [id number] Date of Birth: February 23, 1941  Today's Date: 09/04/2023  Modified Barium Swallow completed.  Full report located under Chart Review in the Imaging Section.  History of Present Illness Patient is an 82 year old male referred for OP MBS and esophagram by [facility] due to recurrent pna. Pt had a August CT scan from 08/09/2023 completed that showed the previous patchy poorly marginated pulmonary nodules with groundglass halos had resolved.  Had numerous similar patchy small nodules that were present throughout the left lower lobe.  Suggestive of recurrent aspiration.When the patient was counseled on swallowing dysfunction he does state that sometimes food gets stuck when he swallows right at the entrance of his thorax. Pt has a history of ACDF and MD questions if this could be impacting swallowing. CT neck reports skull base to T1 posterior surgical hardware fusion.   C6-C7 anterior cervical discectomy and fusion, no hardware.   Clinical Impression Pt demonstrates functional swallowing. There is premature spillage to pyriform sinuses with instances of trace flash penetration. No aspiration. Hardware is behind the spinal column and does not impact the cervical esophagus. Pill hesitated above the PES but passed with a second sip of barium. No further swallowing intervention needed. Factors that may increase risk of adverse event in presence of aspiration Rubye Oaks & Clearance Coots 2021):    Swallow Evaluation Recommendations Recommendations: PO diet PO Diet Recommendation: Regular;Thin liquids (Level 0) Liquid Administration via: Cup;Straw Medication Administration: Whole meds with liquid      Aletha Allebach, Riley Nearing 09/04/2023,12:40 PM

## 2023-09-09 NOTE — Progress Notes (Signed)
Patient has follow up with Beth in decemeber. But swallow study shows no aspiration.   Thanks,  BLI  Josephine Igo, DO [facility] 09/09/2023 3:43 PM

## 2023-09-11 ENCOUNTER — Other Ambulatory Visit: Payer: Self-pay | Admitting: Emergency Medicine

## 2023-09-11 DIAGNOSIS — K224 Dyskinesia of esophagus: Secondary | ICD-10-CM

## 2023-10-26 ENCOUNTER — Emergency Department (HOSPITAL_BASED_OUTPATIENT_CLINIC_OR_DEPARTMENT_OTHER): Payer: Medicare Other

## 2023-10-26 ENCOUNTER — Other Ambulatory Visit: Payer: Self-pay

## 2023-10-26 ENCOUNTER — Observation Stay (HOSPITAL_BASED_OUTPATIENT_CLINIC_OR_DEPARTMENT_OTHER)
Admission: EM | Admit: 2023-10-26 | Discharge: 2023-10-27 | Disposition: A | Discharge status: Home / Self Care | Payer: Medicare Other | Attending: Family Medicine | Admitting: Family Medicine

## 2023-10-26 ENCOUNTER — Encounter (HOSPITAL_BASED_OUTPATIENT_CLINIC_OR_DEPARTMENT_OTHER): Payer: Self-pay | Admitting: Emergency Medicine

## 2023-10-26 DIAGNOSIS — I10 Essential (primary) hypertension: Secondary | ICD-10-CM | POA: Insufficient documentation

## 2023-10-26 DIAGNOSIS — F32A Depression, unspecified: Secondary | ICD-10-CM | POA: Insufficient documentation

## 2023-10-26 DIAGNOSIS — G8929 Other chronic pain: Secondary | ICD-10-CM | POA: Diagnosis present

## 2023-10-26 DIAGNOSIS — I4821 Permanent atrial fibrillation: Secondary | ICD-10-CM | POA: Diagnosis not present

## 2023-10-26 DIAGNOSIS — Z87891 Personal history of nicotine dependence: Secondary | ICD-10-CM | POA: Diagnosis not present

## 2023-10-26 DIAGNOSIS — N400 Enlarged prostate without lower urinary tract symptoms: Secondary | ICD-10-CM | POA: Diagnosis not present

## 2023-10-26 DIAGNOSIS — Z79899 Other long term (current) drug therapy: Secondary | ICD-10-CM | POA: Insufficient documentation

## 2023-10-26 DIAGNOSIS — J189 Pneumonia, unspecified organism: Secondary | ICD-10-CM | POA: Diagnosis not present

## 2023-10-26 DIAGNOSIS — Z952 Presence of prosthetic heart valve: Secondary | ICD-10-CM | POA: Diagnosis not present

## 2023-10-26 DIAGNOSIS — Z1152 Encounter for screening for COVID-19: Secondary | ICD-10-CM | POA: Diagnosis not present

## 2023-10-26 DIAGNOSIS — Z7901 Long term (current) use of anticoagulants: Secondary | ICD-10-CM | POA: Insufficient documentation

## 2023-10-26 DIAGNOSIS — Z8673 Personal history of transient ischemic attack (TIA), and cerebral infarction without residual deficits: Secondary | ICD-10-CM | POA: Insufficient documentation

## 2023-10-26 DIAGNOSIS — G894 Chronic pain syndrome: Secondary | ICD-10-CM | POA: Diagnosis not present

## 2023-10-26 DIAGNOSIS — R059 Cough, unspecified: Secondary | ICD-10-CM | POA: Diagnosis present

## 2023-10-26 LAB — CBC WITH DIFFERENTIAL/PLATELET
Abs Immature Granulocytes: 0.02 10*3/uL (ref 0.00–0.07)
Basophils Absolute: 0 10*3/uL (ref 0.0–0.1)
Basophils Relative: 0 %
Eosinophils Absolute: 0.2 10*3/uL (ref 0.0–0.5)
Eosinophils Relative: 2 %
HCT: 37.1 % — ABNORMAL LOW (ref 39.0–52.0)
Hemoglobin: 12.1 g/dL — ABNORMAL LOW (ref 13.0–17.0)
Immature Granulocytes: 0 %
Lymphocytes Relative: 11 %
Lymphs Abs: 0.9 10*3/uL (ref 0.7–4.0)
MCH: 32.2 pg (ref 26.0–34.0)
MCHC: 32.6 g/dL (ref 30.0–36.0)
MCV: 98.7 fL (ref 80.0–100.0)
Monocytes Absolute: 0.8 10*3/uL (ref 0.1–1.0)
Monocytes Relative: 9 %
Neutro Abs: 6.4 10*3/uL (ref 1.7–7.7)
Neutrophils Relative %: 78 %
Platelets: 154 10*3/uL (ref 150–400)
RBC: 3.76 MIL/uL — ABNORMAL LOW (ref 4.22–5.81)
RDW: 14.8 % (ref 11.5–15.5)
WBC: 8.2 10*3/uL (ref 4.0–10.5)
nRBC: 0 % (ref 0.0–0.2)

## 2023-10-26 LAB — RESP PANEL BY RT-PCR (RSV, FLU A&B, COVID)  RVPGX2
Influenza A by PCR: NEGATIVE
Influenza B by PCR: NEGATIVE
Resp Syncytial Virus by PCR: NEGATIVE
SARS Coronavirus 2 by RT PCR: NEGATIVE

## 2023-10-26 LAB — BASIC METABOLIC PANEL
Anion gap: 7 (ref 5–15)
BUN: 24 mg/dL — ABNORMAL HIGH (ref 8–23)
CO2: 28 mmol/L (ref 22–32)
Calcium: 9.9 mg/dL (ref 8.9–10.3)
Chloride: 103 mmol/L (ref 98–111)
Creatinine, Ser: 1.02 mg/dL (ref 0.61–1.24)
GFR, Estimated: >60 mL/min (ref >60)
Glucose, Bld: 88 mg/dL (ref 70–99)
Potassium: 4.2 mmol/L (ref 3.5–5.1)
Sodium: 138 mmol/L (ref 135–145)

## 2023-10-26 LAB — TROPONIN I (HIGH SENSITIVITY)
Troponin I (High Sensitivity): 6 ng/L (ref <18)
Troponin I (High Sensitivity): 7 ng/L (ref <18)

## 2023-10-26 MED ORDER — SPIRONOLACTONE 25 MG PO TABS
25.0000 mg | ORAL_TABLET | Freq: Every day | ORAL | Status: DC
  Administered 2023-10-27: 25 mg via ORAL
  Filled 2023-10-26: qty 1

## 2023-10-26 MED ORDER — APIXABAN 5 MG PO TABS
5.0000 mg | ORAL_TABLET | Freq: Two times a day (BID) | ORAL | Status: DC
  Administered 2023-10-26 – 2023-10-27 (×2): 5 mg via ORAL
  Filled 2023-10-26 (×2): qty 1

## 2023-10-26 MED ORDER — SIMVASTATIN 40 MG PO TABS
40.0000 mg | ORAL_TABLET | Freq: Every day | ORAL | Status: DC
Start: 2023-10-26 — End: 2023-10-27
  Administered 2023-10-26: 40 mg via ORAL
  Filled 2023-10-26: qty 1

## 2023-10-26 MED ORDER — IOHEXOL 350 MG/ML SOLN
75.0000 mL | Freq: Once | INTRAVENOUS | Status: AC | PRN
  Administered 2023-10-26: 75 mL via INTRAVENOUS

## 2023-10-26 MED ORDER — METOPROLOL SUCCINATE ER 50 MG PO TB24
25.0000 mg | ORAL_TABLET | Freq: Every day | ORAL | Status: DC
  Administered 2023-10-27: 25 mg via ORAL
  Filled 2023-10-26: qty 1

## 2023-10-26 MED ORDER — GABAPENTIN 300 MG PO CAPS
600.0000 mg | ORAL_CAPSULE | Freq: Three times a day (TID) | ORAL | Status: DC
  Administered 2023-10-26 – 2023-10-27 (×2): 600 mg via ORAL
  Filled 2023-10-26 (×2): qty 2

## 2023-10-26 MED ORDER — AZITHROMYCIN 500 MG IV SOLR
500.0000 mg | INTRAVENOUS | Status: DC
  Administered 2023-10-27: 500 mg via INTRAVENOUS
  Filled 2023-10-26: qty 5

## 2023-10-26 MED ORDER — PANTOPRAZOLE SODIUM 40 MG PO TBEC
40.0000 mg | DELAYED_RELEASE_TABLET | Freq: Every day | ORAL | Status: DC
  Administered 2023-10-27: 40 mg via ORAL
  Filled 2023-10-26: qty 1

## 2023-10-26 MED ORDER — FUROSEMIDE 20 MG PO TABS
20.0000 mg | ORAL_TABLET | Freq: Every day | ORAL | Status: DC
  Administered 2023-10-27: 20 mg via ORAL
  Filled 2023-10-26: qty 1

## 2023-10-26 MED ORDER — POLYETHYLENE GLYCOL 3350 17 G PO PACK
17.0000 g | PACK | Freq: Every day | ORAL | Status: DC
  Administered 2023-10-27: 17 g via ORAL
  Filled 2023-10-26: qty 1

## 2023-10-26 MED ORDER — ROPINIROLE HCL 0.25 MG PO TABS
0.2500 mg | ORAL_TABLET | Freq: Two times a day (BID) | ORAL | Status: DC
Start: 2023-10-26 — End: 2023-10-27
  Administered 2023-10-26 – 2023-10-27 (×2): 0.25 mg via ORAL
  Filled 2023-10-26 (×2): qty 1

## 2023-10-26 MED ORDER — CEFTRIAXONE SODIUM 2 G IJ SOLR
2.0000 g | INTRAMUSCULAR | Status: DC
  Administered 2023-10-27: 2 g via INTRAVENOUS
  Filled 2023-10-26: qty 20

## 2023-10-26 MED ORDER — ACETAMINOPHEN 325 MG PO TABS: 650.0000 mg | ORAL_TABLET | Freq: Four times a day (QID) | ORAL | Status: DC | PRN

## 2023-10-26 MED ORDER — ACETAMINOPHEN 650 MG RE SUPP: 650.0000 mg | Freq: Four times a day (QID) | RECTAL | Status: DC | PRN

## 2023-10-26 MED ORDER — ONDANSETRON HCL 4 MG/2ML IJ SOLN: 4.0000 mg | Freq: Four times a day (QID) | INTRAMUSCULAR | Status: DC | PRN

## 2023-10-26 MED ORDER — SENNOSIDES-DOCUSATE SODIUM 8.6-50 MG PO TABS: 1.0000 | ORAL_TABLET | Freq: Every evening | ORAL | Status: DC | PRN

## 2023-10-26 MED ORDER — OXYCODONE-ACETAMINOPHEN 10-325 MG PO TABS: 1.0000 | ORAL_TABLET | Freq: Four times a day (QID) | ORAL | Status: DC | PRN

## 2023-10-26 MED ORDER — OXYCODONE-ACETAMINOPHEN 5-325 MG PO TABS
1.0000 | ORAL_TABLET | Freq: Four times a day (QID) | ORAL | Status: DC | PRN
  Administered 2023-10-26 – 2023-10-27 (×2): 1 via ORAL
  Filled 2023-10-26 (×2): qty 1

## 2023-10-26 MED ORDER — TAMSULOSIN HCL 0.4 MG PO CAPS
0.4000 mg | ORAL_CAPSULE | Freq: Every day | ORAL | Status: DC
  Administered 2023-10-27: 0.4 mg via ORAL
  Filled 2023-10-26: qty 1

## 2023-10-26 MED ORDER — SODIUM CHLORIDE 0.9% FLUSH
3.0000 mL | Freq: Two times a day (BID) | INTRAVENOUS | Status: DC
  Administered 2023-10-26 – 2023-10-27 (×2): 3 mL via INTRAVENOUS

## 2023-10-26 MED ORDER — OXYCODONE HCL 5 MG PO TABS
5.0000 mg | ORAL_TABLET | Freq: Four times a day (QID) | ORAL | Status: DC | PRN
  Administered 2023-10-27: 5 mg via ORAL
  Filled 2023-10-26: qty 1

## 2023-10-26 MED ORDER — DEXTROSE 5 % IV SOLN
500.0000 mg | Freq: Once | INTRAVENOUS | Status: AC
  Administered 2023-10-26: 500 mg via INTRAVENOUS

## 2023-10-26 MED ORDER — ASPIRIN 81 MG PO TBEC: 81.0000 mg | DELAYED_RELEASE_TABLET | Freq: Every day | ORAL | Status: DC

## 2023-10-26 MED ORDER — BISACODYL 5 MG PO TBEC: 5.0000 mg | DELAYED_RELEASE_TABLET | Freq: Every day | ORAL | Status: DC | PRN

## 2023-10-26 MED ORDER — SODIUM CHLORIDE 0.9 % IV SOLN
1.0000 g | Freq: Once | INTRAVENOUS | Status: AC
Start: 2023-10-26 — End: 2023-10-26
  Administered 2023-10-26: 1 g via INTRAVENOUS
  Filled 2023-10-26: qty 10

## 2023-10-26 MED ORDER — ONDANSETRON HCL 4 MG PO TABS: 4.0000 mg | ORAL_TABLET | Freq: Four times a day (QID) | ORAL | Status: DC | PRN

## 2023-10-26 MED ORDER — GUAIFENESIN ER 600 MG PO TB12
600.0000 mg | ORAL_TABLET | Freq: Two times a day (BID) | ORAL | Status: DC
  Administered 2023-10-26 – 2023-10-27 (×2): 600 mg via ORAL
  Filled 2023-10-26 (×2): qty 1

## 2023-10-26 NOTE — ED Triage Notes (Signed)
Pt c/o coughing up blood starting last night. Also c/o of epigastric pain radiating to back. Pt takes eliquis

## 2023-10-26 NOTE — ED Provider Notes (Signed)
[facility] AT [facility] Provider Note   CSN: [id number] Arrival date & time: 10/26/23  1002     History  Chief Complaint  Patient presents with   Hemoptysis    [name] is a 82 y.o. male.  Patient is a 82 year old male with a history of atrial fibrillation on Eliquis, hypertension, hyperlipidemia, severe aortic stenosis who presents with cough and hemoptysis.  He states he has had a cough for the last couple days.  He has had some shortness of breath with it.  He has pain across his chest and upper back.  He says the pain is only with coughing.  He has been coughing up some mucus or he feels like he needs to cough up mucus but cannot get it out.  Today he coughed up some mucus mixed with blood.  This happened about 2 or 3 times.  He denies any underlying lung disease.  He does not use inhalers at home.  He has had some increased shortness of breath for the last couple days.  No known fevers.  He has had some nasal congestion but this sounds more chronic for him.  He uses nasal spray.  He has chronic leg swelling but says it is unchanged from his baseline.  He denies any vomiting.       Home Medications Prior to Admission medications   Medication Sig Start Date End Date Taking? Authorizing Provider  apixaban (ELIQUIS) 5 MG TABS tablet Take 1 tablet (5 mg total) by mouth 2 (two) times daily. 10/20/18   Leroy Sea, MD  ascorbic acid (VITAMIN C) 250 MG tablet Take 1 tablet by mouth 2 (two) times daily. 09/02/19   [provider]  aspirin EC 81 MG tablet Take 81 mg by mouth daily.    [provider]  buPROPion (WELLBUTRIN) 100 MG tablet Take 100 mg by mouth 2 (two) times daily.    [provider]  cyanocobalamin (,VITAMIN B-12,) 1000 MCG/ML injection Inject 1 mL into the muscle every 28 (twenty-eight) days. 03/15/20   [provider]  diltiazem (CARDIZEM CD) 120 MG 24 hr capsule Take 1 capsule (120 mg total) by  mouth daily. 10/09/19   Kathlen Mody, MD  ferrous sulfate 325 (65 FE) MG tablet Take 325 mg by mouth daily. 03/15/20   [provider]  fluticasone (FLONASE) 50 MCG/ACT nasal spray Place 1 spray into both nostrils daily as needed for allergies or rhinitis.  07/30/18   [provider]  furosemide (LASIX) 20 MG tablet Take 20 mg by mouth daily.    [provider]  gabapentin (NEURONTIN) 600 MG tablet Take 600 mg by mouth 3 (three) times daily. 03/15/20   [provider]  methocarbamol (ROBAXIN) 500 MG tablet Take 1 tablet (500 mg total) by mouth 2 (two) times daily as needed for muscle spasms. 05/14/23   Jacalyn Lefevre, MD  metoprolol succinate (TOPROL-XL) 25 MG 24 hr tablet Take 25 mg by mouth daily.    [provider]  oxyCODONE-acetaminophen (PERCOCET) 10-325 MG tablet Take 1 tablet by mouth every 6 (six) hours as needed.    [provider]  pantoprazole (PROTONIX) 40 MG tablet Take 1 tablet (40 mg total) by mouth 2 (two) times daily. 10/16/18   Leroy Sea, MD  polyethylene glycol (MIRALAX / GLYCOLAX) packet Take 17 g by mouth daily.    [provider]  potassium chloride (K-DUR,KLOR-CON) 20 MEQ tablet Take 1 tablet (20 mEq total)  by mouth daily. Patient taking differently: Take 20 mEq by mouth at bedtime. 09/20/18   Strader, Lennart Pall, PA-C  rOPINIRole (REQUIP) 0.25 MG tablet Take 0.25 mg by mouth 2 (two) times daily.  10/09/18   [provider]  simvastatin (ZOCOR) 40 MG tablet Take 40 mg by mouth at bedtime.    [provider]  spironolactone (ALDACTONE) 25 MG tablet Take 25 mg by mouth daily.    [provider]  tamsulosin (FLOMAX) 0.4 MG CAPS capsule Take 0.4 mg by mouth daily. 09/02/19   [provider]      Allergies    Lorazepam, Duloxetine, Pregabalin, and Zanaflex [tizanidine hcl]    Review of Systems   Review of Systems  Constitutional:  Positive for fatigue. Negative for chills,  diaphoresis and fever.  HENT:  Positive for congestion. Negative for rhinorrhea and sneezing.   Eyes: Negative.   Respiratory:  Positive for cough and shortness of breath. Negative for chest tightness.   Cardiovascular:  Positive for chest pain and leg swelling.  Gastrointestinal:  Negative for abdominal pain, blood in stool, diarrhea, nausea and vomiting.  Genitourinary:  Negative for difficulty urinating, flank pain, frequency and hematuria.  Musculoskeletal:  Positive for back pain. Negative for arthralgias.  Skin:  Negative for rash.  Neurological:  Negative for dizziness, speech difficulty, weakness, numbness and headaches.    Physical Exam Updated Vital Signs BP (!) 168/104   Pulse 95   Temp 98.2 F (36.8 C) (Oral)   Resp 13   Wt 109.3 kg   SpO2 94%   BMI 35.59 kg/m  Physical Exam Constitutional:      Appearance: He is well-developed.  HENT:     Head: Normocephalic and atraumatic.  Eyes:     Pupils: Pupils are equal, round, and reactive to light.  Cardiovascular:     Rate and Rhythm: Normal rate. Rhythm irregular.     Heart sounds: No murmur heard. Pulmonary:     Effort: Pulmonary effort is normal. No respiratory distress.     Breath sounds: Wheezing present. No rales.  Chest:     Chest wall: No tenderness.  Abdominal:     General: Bowel sounds are normal.     Palpations: Abdomen is soft.     Tenderness: There is no abdominal tenderness. There is no guarding or rebound.  Musculoskeletal:        General: Normal range of motion.     Cervical back: Normal range of motion and neck supple.     Comments: 2+ edema to lower extremities bilaterally  Lymphadenopathy:     Cervical: No cervical adenopathy.  Skin:    General: Skin is warm and dry.     Findings: No rash.  Neurological:     Mental Status: He is alert and oriented to person, place, and time.     ED Results / Procedures / Treatments   Labs (all labs ordered are listed, but only abnormal results are  displayed) Labs Reviewed  BASIC METABOLIC PANEL - Abnormal; Notable for the following components:      Result Value   BUN 24 (*)    All other components within normal limits  CBC WITH DIFFERENTIAL/PLATELET - Abnormal; Notable for the following components:   RBC 3.76 (*)    Hemoglobin 12.1 (*)    HCT 37.1 (*)    All other components within normal limits  RESP PANEL BY RT-PCR (RSV, FLU A&B, COVID)  RVPGX2  TROPONIN I (HIGH SENSITIVITY)  TROPONIN  I (HIGH SENSITIVITY)    EKG EKG Interpretation Date/Time:  Saturday October 26 2023 10:20:20 EST Ventricular Rate:  108 PR Interval:    QRS Duration:  80 QT Interval:  313 QTC Calculation: 402 R Axis:   98  Text Interpretation: Atrial fibrillation Ventricular premature complex Anterior infarct, old SINCE LAST TRACING HEART RATE HAS INCREASED Confirmed by Rolan Bucco [phone]) on 10/26/2023 10:28:43 AM  Radiology CT Angio Chest PE W/Cm &/Or Wo Cm  Result Date: 10/26/2023 CLINICAL DATA:  Chest pain, shortness of breath. EXAM: CT ANGIOGRAPHY CHEST WITH CONTRAST TECHNIQUE: Multidetector CT imaging of the chest was performed using the standard protocol during bolus administration of intravenous contrast. Multiplanar CT image reconstructions and MIPs were obtained to evaluate the vascular anatomy. RADIATION DOSE REDUCTION: This exam was performed according to the departmental dose-optimization program which includes automated exposure control, adjustment of the mA and/or kV according to patient size and/or use of iterative reconstruction technique. CONTRAST:  75mL OMNIPAQUE IOHEXOL 350 MG/ML SOLN COMPARISON:  August 09, 2023. FINDINGS: Cardiovascular: Satisfactory opacification of the pulmonary arteries to the segmental level. No evidence of pulmonary embolism. Mild cardiomegaly. Status post transcatheter aortic valve repair. Mild coronary artery calcifications are noted. No pericardial effusion. Mediastinum/Nodes: No enlarged mediastinal, hilar, or  axillary lymph nodes. Thyroid gland, trachea, and esophagus demonstrate no significant findings. Lungs/Pleura: No pneumothorax or pleural effusion is noted. Significantly increased left lower lobe nodular airspace opacities are noted most consistent with worsening pneumonia. Minimal right lower lobe subsegmental atelectasis is noted. Upper Abdomen: No acute abnormality. Musculoskeletal: Status post surgical posterior fusion of lower thoracic spine. Multilevel degenerative changes are noted. No acute abnormality seen. Review of the MIP images confirms the above findings. IMPRESSION: No definite evidence of pulmonary embolus. Significant increased left lower lobe nodular airspace opacities are noted consistent with worsening pneumonia. Aortic Atherosclerosis (ICD10-I70.0). Electronically Signed   By: Lupita Raider M.D.   On: 10/26/2023 12:48   DG Chest Port 1 View  Result Date: 10/26/2023 CLINICAL DATA:  Hemoptysis. EXAM: PORTABLE CHEST 1 VIEW COMPARISON:  May 14, 2023.  August 09, 2023. FINDINGS: Stable cardiomegaly. Status post surgical posterior fusion of lower thoracic spine. Right lung is clear. Nodular opacity is noted in left lung base which corresponds to nodules seen on prior CT scan. Follow-up CT scan is recommended for evaluation. IMPRESSION: Nodular opacity seen in left lung base as described on prior CT scan of August 09, 2023. Follow-up CT is recommended. Electronically Signed   By: Lupita Raider M.D.   On: 10/26/2023 11:24    Procedures Procedures    Medications Ordered in ED Medications  iohexol (OMNIPAQUE) 350 MG/ML injection 75 mL (75 mLs Intravenous Contrast Given 10/26/23 1208)  cefTRIAXone (ROCEPHIN) 1 g in sodium chloride 0.9 % 100 mL IVPB (0 g Intravenous Stopped 10/26/23 1412)  azithromycin (ZITHROMAX) 500 mg in dextrose 5 % 250 mL IVPB (0 mg Intravenous Stopped 10/26/23 1531)    ED Course/ Medical Decision Making/ A&P                                 Medical Decision  Making Amount and/or Complexity of Data Reviewed Labs: ordered. Radiology: ordered.  Risk Prescription drug management. Decision regarding hospitalization.   Patient is a 82 year old who presents with cough and hemoptysis.  He has some shortness of breath as well.  He was tachycardic on arrival but this has improved.  He was  mildly tachypneic but also has improved with observation.  He is not requiring oxygen.  Chest x-ray two-view was interpreted by me and confirmed by the radiologist to show no acute abnormality.  He had a CTA of his chest which shows left lower lobe pneumonia.  No other acute abnormality.  No PE.  No mass.  He had elevated port score.  He was started on IV antibiotics.  I spoke with Dr. Erenest Blank who will admit the patient for further treatment.  Final Clinical Impression(s) / ED Diagnoses Final diagnoses:  Community acquired pneumonia of left lower lobe of lung    Rx / DC Orders ED Discharge Orders     None         Rolan Bucco, MD 10/26/23 1608

## 2023-10-26 NOTE — ED Notes (Signed)
Called [facility] for transport, pt bed assignment is ready

## 2023-10-26 NOTE — H&P (Signed)
History and Physical    [name] MVH:[id number] DOB: January 06, 1941 DOA: 10/26/2023  PCP: Dois Davenport, MD  Patient coming from: Home  I have personally briefly reviewed patient's old medical records in [facility]  Chief Complaint: Cough  HPI: [name] is a 82 y.o. male with medical history significant for permanent A-fib on Eliquis, severe AS s/p TAVR, history of CVA, BPH, chronic lower extremity swelling, chronic pain who presents to the ED for evaluation of cough.  Patient states that he has been having chest congestion and frequent cough for the last few days.  Cough is mostly productive of clear sputum but he has been seeing some small spots of red blood mixed in as well.  He has not really had much shortness of breath.  He denies fevers, chills, diaphoresis.  He was concerned about the blood in his sputum and he called his PCP.  He was advised to come to the ED for further evaluation.  Patient does take Eliquis chronically for history of A-fib and stroke.  [facility] ED Course  Labs/Imaging on admission: I have personally reviewed following labs and imaging studies.  Initial vitals showed BP 110/74, pulse 99, RR 19, temp 98.3 F, SpO2 92% on room air.  Labs show WBC 8.2, hemoglobin 12.1, platelets 154,000, sodium 138, potassium 4.2, bicarb 28, BUN 24, creatinine 1.02, serum glucose 88, troponin negative x 2.  SARS-CoV-2, influenza, RSV PCR negative.  CTA chest negative for evidence of PE.  Significant increase left lower lobe nodular airspace opacities are noted and consistent with worsening pneumonia.  Patient was given IV ceftriaxone and azithromycin.  The hospitalist service was consulted to admit for further evaluation and management.  Review of Systems: All systems reviewed and are negative except as documented in history of present illness above.   Past Medical History:  Diagnosis Date   Cancer [facility])    Carpal tunnel syndrome    Cervical  spondylosis with myelopathy    Chronic pain syndrome    Depression    Difficult intubation    needs smaller tube; awake oral fiberoptic scope 8.0 ETT 03/30/08   Dysphagia, pharyngeal phase    Hemorrhage of gastrointestinal tract, unspecified    History of cardiac cath    a. 03/2018 Cath: LAD and LCX with mild luminal irregularities.  No significant disease.   Iron deficiency anemia, unspecified    Lumbosacral spondylosis without myelopathy    Muscle weakness (generalized)    Other and unspecified disc disorder of cervical region    Other and unspecified hyperlipidemia    Patent foramen ovale    a. 05/2018 Echo: post-op TAVR--> + bublble study w/ L->R shunt.   Permanent atrial fibrillation (HCC)    a.  Diagnosed in the spring 2019.  Had rapid atrial fibrillation following TAVR and has been rate controlled with beta-blocker.  CHA2DS2VASc equals 7.  Supposed to be on Eliquis.   Primary localized osteoarthrosis, lower leg    Primary localized osteoarthrosis, shoulder region    Reflux esophagitis    Severe aortic stenosis    a. mild-mod AS by 01/2016 TTE, restricted AV opening with mod AR 01/2016 TEE; b. 10/2017 Echo: EF 60-65%, sev Ca2+ AoV, mean grad ; c. 05/2018 s/p TAVR ([facility]); d. 06/2018 Echo: EF 55-60%, well-seated prosth AoV, peak velocity 156cm/s. AoV gradient .   Spinal stenosis, unspecified region other than cervical    Stroke (HCC)    01/2016   Syncope and collapse  Unspecified arthropathy, lower leg    Unspecified constipation    Unspecified essential hypertension    Unspecified glaucoma(365.9)     Past Surgical History:  Procedure Laterality Date   BILATERAL CATARACT SURGERY  2009   DR GROAT    BIOPSY  10/14/2018   Procedure: BIOPSY;  Surgeon: Graylin Shiver, MD;  Location: [facility] ENDOSCOPY;  Service: Endoscopy;;   CARDIAC VALVE REPLACEMENT  05/2018   CERVIACAL SPINE (4X)     DR MARK ROY    COLONOSCOPY  2007   DR HENSEL    COLONOSCOPY WITH PROPOFOL N/A 11/20/2017    Procedure: COLONOSCOPY WITH PROPOFOL;  Surgeon: Charlott Rakes, MD;  Location: [facility] ENDOSCOPY;  Service: Endoscopy;  Laterality: N/A;   ESOPHAGOGASTRODUODENOSCOPY (EGD) WITH PROPOFOL N/A 10/14/2018   Procedure: ESOPHAGOGASTRODUODENOSCOPY (EGD) WITH PROPOFOL;  Surgeon: Graylin Shiver, MD;  Location: [facility] ENDOSCOPY;  Service: Endoscopy;  Laterality: N/A;   JOINT REPLACEMENT     both knees   LEFT KNEE REPLACEMENT     DR ALUSIO   LEFT TRANSVERSE CARPAL LIGAMENT  01/06/2008   ROTATOR CUFF LEFT SHOULDER  2001   DR MURPHY    SHOULDER OPEN ROTATOR CUFF REPAIR  2006   DR [facility]   SPINE SURGERY     neck fusion    Social History:  reports that he quit smoking about 38 years ago. His smoking use included cigarettes. He quit smokeless tobacco use about 38 years ago. He reports that he does not drink alcohol and does not use drugs.  Allergies  Allergen Reactions   Lorazepam Other (See Comments)    Delirium  delirium  Delirium   Duloxetine Other (See Comments)    Increased depression, irritability  Increased depression, irritability  Increased depression, irritability  Increased depression, irritability   Pregabalin Other (See Comments) and Swelling    Flashbacks, Bad dreams, Aggressive behavior, swelling  Flashbacks, Bad dreams, Aggressive behavior, swelling  Flashbacks, Bad dreams, Aggressive behavior, swelling  Flashbacks, Bad dreams, Aggressive behavior, swelling  Flashbacks, Bad dreams, Aggressive behavior, swelling  Flashbacks, Bad dreams, Aggressive behavior, swelling   Zanaflex [Tizanidine Hcl] Anxiety    Family History  Problem Relation Age of Onset   Cancer Mother 85       cervical cancer   Cancer Father 79       lung  cancer     Prior to Admission medications   Medication Sig Start Date End Date Taking? Authorizing Provider  apixaban (ELIQUIS) 5 MG TABS tablet Take 1 tablet (5 mg total) by mouth 2 (two) times daily. 10/20/18   Leroy Sea, MD  ascorbic acid  (VITAMIN C) 250 MG tablet Take 1 tablet by mouth 2 (two) times daily. 09/02/19   [provider]  aspirin EC 81 MG tablet Take 81 mg by mouth daily.    [provider]  buPROPion (WELLBUTRIN) 100 MG tablet Take 100 mg by mouth 2 (two) times daily.    [provider]  cyanocobalamin (,VITAMIN B-12,) 1000 MCG/ML injection Inject 1 mL into the muscle every 28 (twenty-eight) days. 03/15/20   [provider]  diltiazem (CARDIZEM CD) 120 MG 24 hr capsule Take 1 capsule (120 mg total) by mouth daily. 10/09/19   Kathlen Mody, MD  ferrous sulfate 325 (65 FE) MG tablet Take 325 mg by mouth daily. 03/15/20   [provider]  fluticasone (FLONASE) 50 MCG/ACT nasal spray Place 1 spray into both nostrils daily as needed for allergies or rhinitis.  07/30/18  [provider]  furosemide (LASIX) 20 MG tablet Take 20 mg by mouth daily.    [provider]  gabapentin (NEURONTIN) 600 MG tablet Take 600 mg by mouth 3 (three) times daily. 03/15/20   [provider]  methocarbamol (ROBAXIN) 500 MG tablet Take 1 tablet (500 mg total) by mouth 2 (two) times daily as needed for muscle spasms. 05/14/23   Jacalyn Lefevre, MD  metoprolol succinate (TOPROL-XL) 25 MG 24 hr tablet Take 25 mg by mouth daily.    [provider]  oxyCODONE-acetaminophen (PERCOCET) 10-325 MG tablet Take 1 tablet by mouth every 6 (six) hours as needed.    [provider]  pantoprazole (PROTONIX) 40 MG tablet Take 1 tablet (40 mg total) by mouth 2 (two) times daily. 10/16/18   Leroy Sea, MD  polyethylene glycol (MIRALAX / GLYCOLAX) packet Take 17 g by mouth daily.    [provider]  potassium chloride (K-DUR,KLOR-CON) 20 MEQ tablet Take 1 tablet (20 mEq total) by mouth daily. Patient taking differently: Take 20 mEq by mouth at bedtime. 09/20/18   Strader, Lennart Pall, PA-C  rOPINIRole (REQUIP) 0.25 MG tablet Take 0.25 mg by mouth 2 (two) times daily.   10/09/18   [provider]  simvastatin (ZOCOR) 40 MG tablet Take 40 mg by mouth at bedtime.    [provider]  spironolactone (ALDACTONE) 25 MG tablet Take 25 mg by mouth daily.    [provider]  tamsulosin (FLOMAX) 0.4 MG CAPS capsule Take 0.4 mg by mouth daily. 09/02/19   [provider]    Physical Exam: Vitals:   10/26/23 1700 10/26/23 1804 10/26/23 1845 10/26/23 1848  BP: (!) 136/90  (!) 120/97   Pulse: 86  98   Resp: 15  17   Temp:  98.1 F (36.7 C) 98.3 F (36.8 C)   TempSrc:   Oral   SpO2: 96%  97%   Weight:    107.4 kg  Height:    5\' 9"  (1.753 m)   Constitutional: Resting in bed, NAD, calm, comfortable Eyes: EOMI, lids and conjunctivae normal ENMT: Mucous membranes are moist. Posterior pharynx clear of any exudate or lesions.Normal dentition.  Neck: normal, supple, no masses. Respiratory: Inspiratory crackles left lower lung field otherwise clear to auscultation. Normal respiratory effort. No accessory muscle use.  Cardiovascular: Irregularly irregular, no murmurs / rubs / gallops.  Trace bilateral lower extremity edema. 2+ pedal pulses. Abdomen: no tenderness, no masses palpated. Musculoskeletal: no clubbing / cyanosis. No joint deformity upper and lower extremities. Good ROM, no contractures. Normal muscle tone.  Skin: no rashes, lesions, ulcers. No induration Neurologic: Sensation intact. Strength 5/5 in all 4.  Psychiatric: Alert and oriented x 3. Normal mood.   EKG: Personally reviewed. Atrial fibrillation, rate 108, PVCs present.  Not significantly changed when compared to previous.  Assessment/Plan Principal Problem:   Community acquired pneumonia of left lower lobe of lung Active Problems:   Chronic pain   HYPERTENSION, BENIGN   S/P TAVR (transcatheter aortic valve replacement)   Permanent atrial fibrillation (HCC)   History of CVA (cerebrovascular accident)   [name] is a 82 y.o. male with medical history  significant for permanent A-fib on Eliquis, severe AS s/p TAVR, history of CVA, BPH, chronic lower extremity swelling, chronic pain who is admitted with CAP of the LLL.  Assessment and Plan: Community-acquired pneumonia of the left lower lobe: CT consistent with left lower lobe pneumonia.  Patient has productive cough, some  small spotting of blood but no frank mopped assist.  Saturating well on room air at time of admission. -IV ceftriaxone and azithromycin -Follow strep pneumonia and Legionella antigens -COVID, flu, RSV negative -Supplemental oxygen as needed -IS, FV, mucinex  Permanent atrial fibrillation: Remains in atrial fibrillation with controlled rate.  Continue Eliquis and Toprol-XL.  Hypertension: Continue Toprol-XL, spironolactone.  History of CVA: Continue Eliquis and simvastatin.  Chronic pain: Continue home gabapentin and Percocet as needed.  BPH: Continue Flomax.  Chronic lower extremity edema: He takes Lasix chronically for this, can continue.  Severe AS s/p TAVR   DVT prophylaxis:  apixaban (ELIQUIS) tablet 5 mg   Code Status: Full code, confirmed with patient on admission Family Communication: Discussed with patient, he has discussed with his sister Disposition Plan: From home and likely discharge to home pending clinical progress Consults called: None Severity of Illness: The appropriate patient status for this patient is INPATIENT. Inpatient status is judged to be reasonable and necessary in order to provide the required intensity of service to ensure the patient's safety. The patient's presenting symptoms, physical exam findings, and initial radiographic and laboratory data in the context of their chronic comorbidities is felt to place them at high risk for further clinical deterioration. Furthermore, it is not anticipated that the patient will be medically stable for discharge from the hospital within 2 midnights of admission.   * I certify that at the  point of admission it is my clinical judgment that the patient will require inpatient hospital care spanning beyond 2 midnights from the point of admission due to high intensity of service, high risk for further deterioration and high frequency of surveillance required.Darreld Mclean MD [facility]  If 7PM-7AM, please contact night-coverage [email or web address]  10/26/2023, 7:50 PM

## 2023-10-26 NOTE — Hospital Course (Signed)
[name] is a 82 y.o. male with medical history significant for permanent A-fib on Eliquis, severe AS s/p TAVR, history of CVA, BPH, chronic lower extremity swelling, chronic pain who is admitted with CAP of the LLL.

## 2023-10-27 DIAGNOSIS — J189 Pneumonia, unspecified organism: Secondary | ICD-10-CM | POA: Diagnosis not present

## 2023-10-27 LAB — BASIC METABOLIC PANEL
Anion gap: 8 (ref 5–15)
BUN: 21 mg/dL (ref 8–23)
CO2: 25 mmol/L (ref 22–32)
Calcium: 9.1 mg/dL (ref 8.9–10.3)
Chloride: 103 mmol/L (ref 98–111)
Creatinine, Ser: 0.95 mg/dL (ref 0.61–1.24)
GFR, Estimated: >60 mL/min (ref >60)
Glucose, Bld: 80 mg/dL (ref 70–99)
Potassium: 4.3 mmol/L (ref 3.5–5.1)
Sodium: 136 mmol/L (ref 135–145)

## 2023-10-27 LAB — CBC
HCT: 36 % — ABNORMAL LOW (ref 39.0–52.0)
Hemoglobin: 11.6 g/dL — ABNORMAL LOW (ref 13.0–17.0)
MCH: 32.4 pg (ref 26.0–34.0)
MCHC: 32.2 g/dL (ref 30.0–36.0)
MCV: 100.6 fL — ABNORMAL HIGH (ref 80.0–100.0)
Platelets: 141 10*3/uL — ABNORMAL LOW (ref 150–400)
RBC: 3.58 MIL/uL — ABNORMAL LOW (ref 4.22–5.81)
RDW: 14.6 % (ref 11.5–15.5)
WBC: 7.6 10*3/uL (ref 4.0–10.5)
nRBC: 0 % (ref 0.0–0.2)

## 2023-10-27 MED ORDER — AMOXICILLIN-POT CLAVULANATE 875-125 MG PO TABS: 1.0000 | ORAL_TABLET | Freq: Two times a day (BID) | ORAL | 0 refills | Status: AC

## 2023-10-27 NOTE — Plan of Care (Signed)
  Problem: Education: Goal: Knowledge of General Education information will improve Description: Including pain rating scale, medication(s)/side effects and non-pharmacologic comfort measures Outcome: Adequate for Discharge   Problem: Health Behavior/Discharge Planning: Goal: Ability to manage health-related needs will improve Outcome: Adequate for Discharge   Problem: Clinical Measurements: Goal: Ability to maintain clinical measurements within normal limits will improve Outcome: Adequate for Discharge Goal: Will remain free from infection Outcome: Adequate for Discharge Goal: Diagnostic test results will improve Outcome: Adequate for Discharge Goal: Respiratory complications will improve Outcome: Adequate for Discharge Goal: Cardiovascular complication will be avoided Outcome: Adequate for Discharge   Problem: Activity: Goal: Risk for activity intolerance will decrease Outcome: Adequate for Discharge   Problem: Nutrition: Goal: Adequate nutrition will be maintained Outcome: Adequate for Discharge   Problem: Coping: Goal: Level of anxiety will decrease Outcome: Adequate for Discharge   Problem: Elimination: Goal: Will not experience complications related to bowel motility Outcome: Adequate for Discharge Goal: Will not experience complications related to urinary retention Outcome: Adequate for Discharge   Problem: Pain Management: Goal: General experience of comfort will improve Outcome: Adequate for Discharge   Problem: Safety: Goal: Ability to remain free from injury will improve Outcome: Adequate for Discharge   Problem: Skin Integrity: Goal: Risk for impaired skin integrity will decrease Outcome: Adequate for Discharge   Problem: Activity: Goal: Ability to tolerate increased activity will improve Outcome: Adequate for Discharge   Problem: Clinical Measurements: Goal: Ability to maintain a body temperature in the normal range will improve Outcome: Adequate  for Discharge   Problem: Respiratory: Goal: Ability to maintain adequate ventilation will improve Outcome: Adequate for Discharge Goal: Ability to maintain a clear airway will improve Outcome: Adequate for Discharge

## 2023-10-27 NOTE — Discharge Summary (Signed)
Physician Discharge Summary  FIELDS [name]:[id number] DOB: 09-27-1941 DOA: 10/26/2023  PCP: Dois Davenport, MD  Admit date: 10/26/2023 Discharge date: 10/27/2023 30 Day Unplanned Readmission Risk Score    Flowsheet Row ED to Hosp-Admission (Current) from 10/26/2023 in [facility]TH FLOOR PROGRESSIVE CARE AND UROLOGY  30 Day Unplanned Readmission Risk Score (%) 16.56 Filed at 10/27/2023 0801       This score is the patient's risk of an unplanned readmission within 30 days of being discharged (0 -100%). The score is based on dignosis, age, lab data, medications, orders, and past utilization.   Low:  0-14.9   Medium: 15-21.9   High: 22-29.9   Extreme: 30 and above          Admitted From: Home Disposition: Home  Recommendations for Outpatient Follow-up:  Follow up with PCP in 1-2 weeks Please obtain BMP/CBC in one week Please follow up with your PCP on the following pending results: Unresulted Labs (From admission, onward)     Start     Ordered   10/26/23 1935  Strep pneumoniae urinary antigen  (COPD / Pneumonia / Cellulitis / Lower Extremity Wound)  Once,   R        10/26/23 1935   10/26/23 1935  Legionella Pneumophila Serogp 1 Ur Ag  (COPD / Pneumonia / Cellulitis / Lower Extremity Wound)  Once,   R        10/26/23 1935   10/26/23 1935  Expectorated Sputum Assessment w Gram Stain, Rflx to Resp Cult  (COPD / Pneumonia / Cellulitis / Lower Extremity Wound)  Once,   R        10/26/23 1935              Home Health: None Equipment/Devices: None  Discharge Condition: Stable CODE STATUS: Full code Diet recommendation: Cardiac  Following HPI and ED course is copied from admitting hospitalist H&P. HPI: [name] is a 82 y.o. male with medical history significant for permanent A-fib on Eliquis, severe AS s/p TAVR, history of CVA, BPH, chronic lower extremity swelling, chronic pain who presents to the ED for evaluation of cough.   Patient states that he has been  having chest congestion and frequent cough for the last few days.  Cough is mostly productive of clear sputum but he has been seeing some small spots of red blood mixed in as well.  He has not really had much shortness of breath.  He denies fevers, chills, diaphoresis.  He was concerned about the blood in his sputum and he called his PCP.  He was advised to come to the ED for further evaluation.  Patient does take Eliquis chronically for history of A-fib and stroke.   [facility] ED Course  Labs/Imaging on admission: I have personally reviewed following labs and imaging studies.   Initial vitals showed BP 110/74, pulse 99, RR 19, temp 98.3 F, SpO2 92% on room air.   Labs show WBC 8.2, hemoglobin 12.1, platelets 154,000, sodium 138, potassium 4.2, bicarb 28, BUN 24, creatinine 1.02, serum glucose 88, troponin negative x 2.   SARS-CoV-2, influenza, RSV PCR negative.   CTA chest negative for evidence of PE.  Significant increase left lower lobe nodular airspace opacities are noted and consistent with worsening pneumonia.   Patient was given IV ceftriaxone and azithromycin.  The hospitalist service was consulted to admit for further evaluation and management.  Subjective: Seen and examined.  No complaints.  Has not had any hemoptysis.  No shortness of breath.  No fever.  He is agreeable with going home today.  Brief/Interim Summary: Patient was admitted for community-acquired pneumonia, mainly because of hemoptysis.  CT chest was negative for PE.  He was never hypoxic and never febrile.  Doing well today and has no symptoms.  No leukocytosis.  Stable for discharge.  Will prescribe 7 days of Augmentin.  Resume other home medications.  Discharge plan was discussed with patient and/or family member and they verbalized understanding and agreed with it.  Discharge Diagnoses:  Principal Problem:   Community acquired pneumonia of left lower lobe of lung Active Problems:   Chronic pain    HYPERTENSION, BENIGN   S/P TAVR (transcatheter aortic valve replacement)   Permanent atrial fibrillation (HCC)   History of CVA (cerebrovascular accident)   CAP (community acquired pneumonia)    Discharge Instructions   Allergies as of 10/27/2023       Reactions   Lorazepam Other (See Comments)   Delirium   Duloxetine Other (See Comments)   Increased depression, irritability   Pregabalin Swelling, Other (See Comments)   Flashbacks, nightmares, aggression   Zanaflex [tizanidine Hcl] Swelling, Anxiety        Medication List     STOP taking these medications    doxycycline 100 MG capsule Commonly known as: VIBRAMYCIN       TAKE these medications    amoxicillin-clavulanate 875-125 MG tablet Commonly known as: AUGMENTIN Take 1 tablet by mouth 2 (two) times daily for 7 days.   apixaban 5 MG Tabs tablet Commonly known as: Eliquis Take 1 tablet (5 mg total) by mouth 2 (two) times daily.   ascorbic acid 250 MG tablet Commonly known as: VITAMIN C Take 1 tablet by mouth 2 (two) times daily.   aspirin EC 81 MG tablet Take 81 mg by mouth daily.   buPROPion 100 MG tablet Commonly known as: WELLBUTRIN Take 100 mg by mouth 2 (two) times daily.   cyanocobalamin 1000 MCG/ML injection Commonly known as: VITAMIN B12 Inject 1 mL into the muscle every 28 (twenty-eight) days.   diltiazem 120 MG 24 hr capsule Commonly known as: CARDIZEM CD Take 1 capsule (120 mg total) by mouth daily.   ferrous sulfate 325 (65 FE) MG tablet Take 325 mg by mouth 2 (two) times daily with a meal.   furosemide 20 MG tablet Commonly known as: LASIX Take 20 mg by mouth daily. May take one additional tablet as needed for leg swelling.   gabapentin 800 MG tablet Commonly known as: NEURONTIN Take 800 mg by mouth 3 (three) times daily.   methocarbamol 500 MG tablet Commonly known as: ROBAXIN Take 1 tablet (500 mg total) by mouth 2 (two) times daily as needed for muscle spasms.    metoprolol succinate 25 MG 24 hr tablet Commonly known as: TOPROL-XL Take 25 mg by mouth daily.   oxyCODONE-acetaminophen 10-325 MG tablet Commonly known as: PERCOCET Take 1 tablet by mouth 5 (five) times daily as needed for pain.   polyethylene glycol 17 g packet Commonly known as: MIRALAX / GLYCOLAX Take 17 g by mouth daily as needed for mild constipation.   potassium chloride SA 20 MEQ tablet Commonly known as: KLOR-CON M Take 1 tablet (20 mEq total) by mouth daily. What changed: when to take this   rOPINIRole 0.25 MG tablet Commonly known as: REQUIP Take 0.25 mg by mouth 2 (two) times daily.   simvastatin 40 MG tablet Commonly known as: ZOCOR Take 40 mg by  mouth at bedtime.   spironolactone 25 MG tablet Commonly known as: ALDACTONE Take 25 mg by mouth daily.   sucralfate 1 g tablet Commonly known as: CARAFATE Take 1 g by mouth 2 (two) times daily.   tamsulosin 0.4 MG Caps capsule Commonly known as: FLOMAX Take 0.4 mg by mouth daily.   topiramate 25 MG tablet Commonly known as: TOPAMAX Take 25 mg by mouth 2 (two) times daily.        Follow-up Information     Dois Davenport, MD Follow up in 1 week(s).   Specialty: Family Medicine Contact information: [address] [place] [address] [phone]                Allergies  Allergen Reactions   Lorazepam Other (See Comments)    Delirium   Duloxetine Other (See Comments)    Increased depression, irritability   Pregabalin Swelling and Other (See Comments)    Flashbacks, nightmares, aggression   Zanaflex [Tizanidine Hcl] Swelling and Anxiety    Consultations: None   Procedures/Studies: CT Angio Chest PE W/Cm &/Or Wo Cm  Result Date: 10/26/2023 CLINICAL DATA:  Chest pain, shortness of breath. EXAM: CT ANGIOGRAPHY CHEST WITH CONTRAST TECHNIQUE: Multidetector CT imaging of the chest was performed using the standard protocol during bolus administration of intravenous contrast.  Multiplanar CT image reconstructions and MIPs were obtained to evaluate the vascular anatomy. RADIATION DOSE REDUCTION: This exam was performed according to the departmental dose-optimization program which includes automated exposure control, adjustment of the mA and/or kV according to patient size and/or use of iterative reconstruction technique. CONTRAST:  75mL OMNIPAQUE IOHEXOL 350 MG/ML SOLN COMPARISON:  August 09, 2023. FINDINGS: Cardiovascular: Satisfactory opacification of the pulmonary arteries to the segmental level. No evidence of pulmonary embolism. Mild cardiomegaly. Status post transcatheter aortic valve repair. Mild coronary artery calcifications are noted. No pericardial effusion. Mediastinum/Nodes: No enlarged mediastinal, hilar, or axillary lymph nodes. Thyroid gland, trachea, and esophagus demonstrate no significant findings. Lungs/Pleura: No pneumothorax or pleural effusion is noted. Significantly increased left lower lobe nodular airspace opacities are noted most consistent with worsening pneumonia. Minimal right lower lobe subsegmental atelectasis is noted. Upper Abdomen: No acute abnormality. Musculoskeletal: Status post surgical posterior fusion of lower thoracic spine. Multilevel degenerative changes are noted. No acute abnormality seen. Review of the MIP images confirms the above findings. IMPRESSION: No definite evidence of pulmonary embolus. Significant increased left lower lobe nodular airspace opacities are noted consistent with worsening pneumonia. Aortic Atherosclerosis (ICD10-I70.0). Electronically Signed   By: Lupita Raider M.D.   On: 10/26/2023 12:48   DG Chest Port 1 View  Result Date: 10/26/2023 CLINICAL DATA:  Hemoptysis. EXAM: PORTABLE CHEST 1 VIEW COMPARISON:  May 14, 2023.  August 09, 2023. FINDINGS: Stable cardiomegaly. Status post surgical posterior fusion of lower thoracic spine. Right lung is clear. Nodular opacity is noted in left lung base which corresponds to  nodules seen on prior CT scan. Follow-up CT scan is recommended for evaluation. IMPRESSION: Nodular opacity seen in left lung base as described on prior CT scan of August 09, 2023. Follow-up CT is recommended. Electronically Signed   By: Lupita Raider M.D.   On: 10/26/2023 11:24     Discharge Exam: Vitals:   10/26/23 2243 10/27/23 1050  BP: 112/83 110/75  Pulse: 86 73  Resp: 20   Temp: 97.8 F (36.6 C) 97.9 F (36.6 C)  SpO2: 96% 98%   Vitals:   10/26/23 1848 10/26/23 2009  10/26/23 2243 10/27/23 1050  BP:  (!) 142/88 112/83 110/75  Pulse:  86 86 73  Resp:  20 20   Temp:  98.3 F (36.8 C) 97.8 F (36.6 C) 97.9 F (36.6 C)  TempSrc:  Oral Oral Oral  SpO2:  94% 96% 98%  Weight: 107.4 kg     Height: 5\' 9"  (1.753 m)       General: Pt is alert, awake, not in acute distress Cardiovascular: RRR, S1/S2 +, no rubs, no gallops Respiratory: CTA bilaterally, no wheezing, no rhonchi Abdominal: Soft, NT, ND, bowel sounds + Extremities: no edema, no cyanosis    The results of significant diagnostics from this hospitalization (including imaging, microbiology, ancillary and laboratory) are listed below for reference.     Microbiology: Recent Results (from the past 240 hour(s))  Resp panel by RT-PCR (RSV, Flu A&B, Covid) Anterior Nasal Swab     Status: None   Collection Time: 10/26/23 12:31 PM   Specimen: Anterior Nasal Swab  Result Value Ref Range Status   SARS Coronavirus 2 by RT PCR NEGATIVE NEGATIVE Final    Comment: (NOTE) SARS-CoV-2 target nucleic acids are NOT DETECTED.  The SARS-CoV-2 RNA is generally detectable in upper respiratory specimens during the acute phase of infection. The lowest concentration of SARS-CoV-2 viral copies this assay can detect is 138 copies/mL. A negative result does not preclude SARS-Cov-2 infection and should not be used as the sole basis for treatment or other patient management decisions. A negative result may occur with  improper specimen  collection/handling, submission of specimen other than nasopharyngeal swab, presence of viral mutation(s) within the areas targeted by this assay, and inadequate number of viral copies(<138 copies/mL). A negative result must be combined with clinical observations, patient history, and epidemiological information. The expected result is Negative.  Fact Sheet for Patients:  BloggerCourse.com  Fact Sheet for Healthcare Providers:  SeriousBroker.it  This test is no t yet approved or cleared by the [place] FDA and  has been authorized for detection and/or diagnosis of SARS-CoV-2 by FDA under an Emergency Use Authorization (EUA). This EUA will remain  in effect (meaning this test can be used) for the duration of the COVID-19 declaration under Section 564(b)(1) of the Act, 21 U.S.C.section 360bbb-3(b)(1), unless the authorization is terminated  or revoked sooner.       Influenza A by PCR NEGATIVE NEGATIVE Final   Influenza B by PCR NEGATIVE NEGATIVE Final    Comment: (NOTE) The Xpert Xpress SARS-CoV-2/FLU/RSV plus assay is intended as an aid in the diagnosis of influenza from Nasopharyngeal swab specimens and should not be used as a sole basis for treatment. Nasal washings and aspirates are unacceptable for Xpert Xpress SARS-CoV-2/FLU/RSV testing.  Fact Sheet for Patients: BloggerCourse.com  Fact Sheet for Healthcare Providers: SeriousBroker.it  This test is not yet approved or cleared by the [place] FDA and has been authorized for detection and/or diagnosis of SARS-CoV-2 by FDA under an Emergency Use Authorization (EUA). This EUA will remain in effect (meaning this test can be used) for the duration of the COVID-19 declaration under Section 564(b)(1) of the Act, 21 U.S.C. section 360bbb-3(b)(1), unless the authorization is terminated or revoked.     Resp Syncytial  Virus by PCR NEGATIVE NEGATIVE Final    Comment: (NOTE) Fact Sheet for Patients: BloggerCourse.com  Fact Sheet for Healthcare Providers: SeriousBroker.it  This test is not yet approved or cleared by the [place] FDA and has been authorized for detection and/or diagnosis of SARS-CoV-2  by FDA under an Emergency Use Authorization (EUA). This EUA will remain in effect (meaning this test can be used) for the duration of the COVID-19 declaration under Section 564(b)(1) of the Act, 21 U.S.C. section 360bbb-3(b)(1), unless the authorization is terminated or revoked.  Performed at [facility], [address], [place], [place] [address]      Labs: BNP (last 3 results) Recent Labs    04/29/23 1511  BNP 190.3*   Basic Metabolic Panel: Recent Labs  Lab 10/26/23 1028 10/27/23 0529  NA 138 136  K 4.2 4.3  CL 103 103  CO2 28 25  GLUCOSE 88 80  BUN 24* 21  CREATININE 1.02 0.95  CALCIUM 9.9 9.1   Liver Function Tests: No results for input(s): "AST", "ALT", "ALKPHOS", "BILITOT", "PROT", "ALBUMIN" in the last 168 hours. No results for input(s): "LIPASE", "AMYLASE" in the last 168 hours. No results for input(s): "AMMONIA" in the last 168 hours. CBC: Recent Labs  Lab 10/26/23 1028 10/27/23 0529  WBC 8.2 7.6  NEUTROABS 6.4  --   HGB 12.1* 11.6*  HCT 37.1* 36.0*  MCV 98.7 100.6*  PLT 154 141*   Cardiac Enzymes: No results for input(s): "CKTOTAL", "CKMB", "CKMBINDEX", "TROPONINI" in the last 168 hours. BNP: Invalid input(s): "POCBNP" CBG: No results for input(s): "GLUCAP" in the last 168 hours. D-Dimer No results for input(s): "DDIMER" in the last 72 hours. Hgb A1c No results for input(s): "HGBA1C" in the last 72 hours. Lipid Profile No results for input(s): "CHOL", "HDL", "LDLCALC", "TRIG", "CHOLHDL", "LDLDIRECT" in the last 72 hours. Thyroid function studies No results for input(s): "TSH",  "T4TOTAL", "T3FREE", "THYROIDAB" in the last 72 hours.  Invalid input(s): "FREET3" Anemia work up No results for input(s): "VITAMINB12", "FOLATE", "FERRITIN", "TIBC", "IRON", "RETICCTPCT" in the last 72 hours. Urinalysis    Component Value Date/Time   COLORURINE YELLOW (A) 03/18/2023 2153   APPEARANCEUR CLEAR (A) 03/18/2023 2153   LABSPEC 1.021 03/18/2023 2153   PHURINE 6.0 03/18/2023 2153   GLUCOSEU NEGATIVE 03/18/2023 2153   HGBUR SMALL (A) 03/18/2023 2153   HGBUR negative 04/08/2007 1327   BILIRUBINUR NEGATIVE 03/18/2023 2153   KETONESUR 5 (A) 03/18/2023 2153   PROTEINUR 100 (A) 03/18/2023 2153   UROBILINOGEN 0.2 04/24/2010 1617   NITRITE NEGATIVE 03/18/2023 2153   LEUKOCYTESUR NEGATIVE 03/18/2023 2153   Sepsis Labs Recent Labs  Lab 10/26/23 1028 10/27/23 0529  WBC 8.2 7.6   Microbiology Recent Results (from the past 240 hour(s))  Resp panel by RT-PCR (RSV, Flu A&B, Covid) Anterior Nasal Swab     Status: None   Collection Time: 10/26/23 12:31 PM   Specimen: Anterior Nasal Swab  Result Value Ref Range Status   SARS Coronavirus 2 by RT PCR NEGATIVE NEGATIVE Final    Comment: (NOTE) SARS-CoV-2 target nucleic acids are NOT DETECTED.  The SARS-CoV-2 RNA is generally detectable in upper respiratory specimens during the acute phase of infection. The lowest concentration of SARS-CoV-2 viral copies this assay can detect is 138 copies/mL. A negative result does not preclude SARS-Cov-2 infection and should not be used as the sole basis for treatment or other patient management decisions. A negative result may occur with  improper specimen collection/handling, submission of specimen other than nasopharyngeal swab, presence of viral mutation(s) within the areas targeted by this assay, and inadequate number of viral copies(<138 copies/mL). A negative result must be combined with clinical observations, patient history, and epidemiological information. The expected result is  Negative.  Fact Sheet for Patients:  BloggerCourse.com  Fact Sheet for Healthcare Providers:  SeriousBroker.it  This test is no t yet approved or cleared by the [place] FDA and  has been authorized for detection and/or diagnosis of SARS-CoV-2 by FDA under an Emergency Use Authorization (EUA). This EUA will remain  in effect (meaning this test can be used) for the duration of the COVID-19 declaration under Section 564(b)(1) of the Act, 21 U.S.C.section 360bbb-3(b)(1), unless the authorization is terminated  or revoked sooner.       Influenza A by PCR NEGATIVE NEGATIVE Final   Influenza B by PCR NEGATIVE NEGATIVE Final    Comment: (NOTE) The Xpert Xpress SARS-CoV-2/FLU/RSV plus assay is intended as an aid in the diagnosis of influenza from Nasopharyngeal swab specimens and should not be used as a sole basis for treatment. Nasal washings and aspirates are unacceptable for Xpert Xpress SARS-CoV-2/FLU/RSV testing.  Fact Sheet for Patients: BloggerCourse.com  Fact Sheet for Healthcare Providers: SeriousBroker.it  This test is not yet approved or cleared by the [place] FDA and has been authorized for detection and/or diagnosis of SARS-CoV-2 by FDA under an Emergency Use Authorization (EUA). This EUA will remain in effect (meaning this test can be used) for the duration of the COVID-19 declaration under Section 564(b)(1) of the Act, 21 U.S.C. section 360bbb-3(b)(1), unless the authorization is terminated or revoked.     Resp Syncytial Virus by PCR NEGATIVE NEGATIVE Final    Comment: (NOTE) Fact Sheet for Patients: BloggerCourse.com  Fact Sheet for Healthcare Providers: SeriousBroker.it  This test is not yet approved or cleared by the [place] FDA and has been authorized for detection and/or diagnosis of  SARS-CoV-2 by FDA under an Emergency Use Authorization (EUA). This EUA will remain in effect (meaning this test can be used) for the duration of the COVID-19 declaration under Section 564(b)(1) of the Act, 21 U.S.C. section 360bbb-3(b)(1), unless the authorization is terminated or revoked.  Performed at [facility], [address] [place], [place] [address]     FURTHER DISCHARGE INSTRUCTIONS:   Get Medicines reviewed and adjusted: Please take all your medications with you for your next visit with your Primary MD   Laboratory/radiological data: Please request your Primary MD to go over all hospital tests and procedure/radiological results at the follow up, please ask your Primary MD to get all Hospital records sent to his/her office.   In some cases, they will be blood work, cultures and biopsy results pending at the time of your discharge. Please request that your primary care M.D. goes through all the records of your hospital data and follows up on these results.   Also Note the following: If you experience worsening of your admission symptoms, develop shortness of breath, life threatening emergency, suicidal or homicidal thoughts you must seek medical attention immediately by calling 911 or calling your MD immediately  if symptoms less severe.   You must read complete instructions/literature along with all the possible adverse reactions/side effects for all the Medicines you take and that have been prescribed to you. Take any new Medicines after you have completely understood and accpet all the possible adverse reactions/side effects.    Do not drive when taking Pain medications or sleeping medications (Benzodaizepines)   Do not take more than prescribed Pain, Sleep and Anxiety Medications. It is not advisable to combine anxiety,sleep and pain medications without talking with your primary care practitioner   Special Instructions: If you have smoked or chewed  Tobacco  in the last 2  yrs please stop smoking, stop any regular Alcohol  and or any Recreational drug use.   Wear Seat belts while driving.   Please note: You were cared for by a hospitalist during your hospital stay. Once you are discharged, your primary care physician will handle any further medical issues. Please note that NO REFILLS for any discharge medications will be authorized once you are discharged, as it is imperative that you return to your primary care physician (or establish a relationship with a primary care physician if you do not have one) for your post hospital discharge needs so that they can reassess your need for medications and monitor your lab values  Time coordinating discharge: Over 30 minutes  SIGNED:   Hughie Closs, MD  [facility] 10/27/2023, 10:56 AM *Please note that this is a verbal dictation therefore any spelling or grammatical errors are due to the "Dragon Medical One" system interpretation. If 7PM-7AM, please contact night-coverage [email or web address]

## 2023-10-27 NOTE — Progress Notes (Signed)
Patient given discharge, medication, and follow up instructions, verbalized understanding, IV and telemetry monitor removed, personal belongings with patient, family to transport home  °

## 2023-10-27 NOTE — Care Management CC44 (Signed)
Condition Code 44 Documentation Completed  Patient Details  Name: HOPPER NORMANN MRN: [id number] Date of Birth: 1941/12/01   Condition Code 44 given:  Yes Patient signature on Condition Code 44 notice:  Yes Documentation of 2 MD's agreement:  Yes Code 44 added to claim:  Yes    Adrian Prows, RN 10/27/2023, 11:10 AM

## 2023-10-27 NOTE — Care Management Obs Status (Signed)
MEDICARE OBSERVATION STATUS NOTIFICATION   Patient Details  Name: [name] MRN: [id number] Date of Birth: 05-01-1941   Medicare Observation Status Notification Given:  Yes    Adrian Prows, RN 10/27/2023, 11:09 AM

## 2023-10-27 NOTE — TOC Initial Note (Signed)
Transition of Care [facility]) - Initial/Assessment Note    Patient Details  Name: [name] MRN: [id number] Date of Birth: 05/12/1941  Transition of Care [facility]) CM/SW Contact:    Adrian Prows, RN Phone Number: 10/27/2023, 11:16 AM  Clinical Narrative:                 Spoke w/pt in room; pt says he lives at home w/ his sister; he plans to return at d/c; he identified POC Hermenia Fiscal (sister) [phone]; pt says he has glasses, and cane; he says his bathroom is fully handicapped equipped; no TOC needs.   Expected Discharge Plan: Home/Self Care Barriers to Discharge: No Barriers Identified   Patient Goals and CMS Choice Patient states their goals for this hospitalization and ongoing recovery are:: home CMS Medicare.gov Compare Post Acute Care list provided to:: Patient        Expected Discharge Plan and Services   Discharge Planning Services: CM Consult   Living arrangements for the past 2 months: Single Family Home Expected Discharge Date: 10/27/23                                    Prior Living Arrangements/Services Living arrangements for the past 2 months: Single Family Home Lives with:: Relatives Patient language and need for interpreter reviewed:: Yes Do you feel safe going back to the place where you live?: Yes      Need for Family Participation in Patient Care: Yes (Comment) Care giver support system in place?: Yes (comment) Current home services: DME (cane) Criminal Activity/Legal Involvement Pertinent to Current Situation/Hospitalization: No - Comment as needed  Activities of Daily Living   ADL Screening (condition at time of admission) Independently performs ADLs?: Yes (appropriate for developmental age) Is the patient deaf or have difficulty hearing?: Yes Does the patient have difficulty seeing, even when wearing glasses/contacts?: Yes Does the patient have difficulty concentrating, remembering, or making decisions?: No  Permission  Sought/Granted Permission sought to share information with : Case Manager Permission granted to share information with : Yes, Verbal Permission Granted  Share Information with NAME: Case Manager     Permission granted to share info w Relationship: Hermenia Fiscal (sister) [phone]     Emotional Assessment Appearance:: Appears stated age Attitude/Demeanor/Rapport: Gracious Affect (typically observed): Accepting Orientation: : Oriented to Self, Oriented to Place, Oriented to  Time, Oriented to Situation Alcohol / Substance Use: Not Applicable Psych Involvement: No (comment)  Admission diagnosis:  CAP (community acquired pneumonia) [J18.9] Community acquired pneumonia of left lower lobe of lung [J18.9] Patient Active Problem List   Diagnosis Date Noted   CAP (community acquired pneumonia) 10/27/2023   Community acquired pneumonia of left lower lobe of lung 10/26/2023   Hypotension 11/04/2021   Laceration of forehead 11/04/2021   Chronic diastolic CHF (congestive heart failure) (HCC) 11/04/2021   Renal cyst 11/04/2021   Hyperkalemia 11/04/2021   Fall 11/03/2021   Palliative care by specialist    Goals of care, counseling/discussion    UTI (urinary tract infection) 11/10/2019   AF (paroxysmal atrial fibrillation) (HCC) 11/10/2019   Acute blood loss anemia 11/10/2019   Cellulitis 11/10/2019   AKI (acute kidney injury) (HCC) 11/10/2019   History of CVA (cerebrovascular accident) 11/10/2019   Abdominal pain 10/07/2019   Pressure injury of skin 10/07/2019   Atrial fibrillation with rapid ventricular response (HCC) 10/06/2019   Acute encephalopathy    GI bleeding  10/13/2018   Acute metabolic encephalopathy 10/13/2018   Acute renal failure (ARF) (HCC) 10/13/2018   S/P TAVR (transcatheter aortic valve replacement) 09/20/2018   Permanent atrial fibrillation (HCC) 09/20/2018   Acute on chronic diastolic CHF (congestive heart failure) (HCC) 09/18/2018   Heme positive stool  11/20/2017   Gait disorder 03/24/2013   Cervical post-laminectomy syndrome 03/26/2012   Depression 03/26/2012   Degenerative arthritis of shoulder region 03/26/2012   KNEE REPLACEMENT, BILATERAL, HX OF 06/04/2007   CELLULITIS/ABSCESS, ARM 05/05/2007   Anxiety state 04/08/2007   Chronic pain 04/08/2007   OSTEOARTHROSIS, GENERALIZED, MULTIPLE SITES 04/08/2007   SPONDYLOLISTHESIS 04/08/2007   INCONTINENCE, URGE 04/08/2007   HYPERCHOLESTEROLEMIA 03/07/2007   HYPERTENSION, BENIGN 03/07/2007   PCP:  Dois Davenport, MD Pharmacy:   [facility] [phone] G[place] Otto, [place] - [address] [place] [address] Phone: [phone] Fax: [phone]  [facility] [address] [place] [address] Phone: [phone] Fax: [phone]     Social Determinants of Health (SDOH) Social History: SDOH Screenings   Food Insecurity: No Food Insecurity (10/27/2023)  Housing: Low Risk  (10/27/2023)  Transportation Needs: No Transportation Needs (10/27/2023)  Utilities: Not At Risk (10/27/2023)  Social Connections: Unknown (04/30/2022)   [facility] from [facility]  Tobacco Use: Medium Risk (10/26/2023)   SDOH Interventions: Food Insecurity Interventions: Intervention Not Indicated, Inpatient TOC Housing Interventions: Intervention Not Indicated, Inpatient TOC Transportation Interventions: Intervention Not Indicated, Inpatient TOC Utilities Interventions: Intervention Not Indicated, Inpatient TOC   Readmission Risk Interventions    10/27/2023   11:13 AM  Readmission Risk Prevention Plan  Transportation Screening Complete  PCP or Specialist Appt within 5-7 Days Complete  Home Care Screening Complete  Medication Review (RN CM) Complete

## 2023-11-06 ENCOUNTER — Encounter: Payer: Self-pay | Admitting: Pulmonary Disease

## 2023-11-11 ENCOUNTER — Telehealth (HOSPITAL_BASED_OUTPATIENT_CLINIC_OR_DEPARTMENT_OTHER): Payer: Self-pay | Admitting: Pulmonary Disease

## 2023-11-18 ENCOUNTER — Ambulatory Visit (HOSPITAL_BASED_OUTPATIENT_CLINIC_OR_DEPARTMENT_OTHER)
Admission: RE | Admit: 2023-11-18 | Discharge: 2023-11-18 | Disposition: A | Discharge status: Home / Self Care | Payer: Medicare Other | Source: Ambulatory Visit | Attending: Pulmonary Disease | Admitting: Pulmonary Disease

## 2023-11-18 DIAGNOSIS — R918 Other nonspecific abnormal finding of lung field: Secondary | ICD-10-CM | POA: Insufficient documentation

## 2023-11-30 ENCOUNTER — Encounter (HOSPITAL_COMMUNITY): Payer: Self-pay

## 2023-11-30 ENCOUNTER — Inpatient Hospital Stay (HOSPITAL_COMMUNITY)
Admission: EM | Admit: 2023-11-30 | Discharge: 2023-12-03 | DRG: 194 | Disposition: A | Discharge status: Home Health | Payer: Medicare Other | Attending: Internal Medicine | Admitting: Internal Medicine

## 2023-11-30 ENCOUNTER — Inpatient Hospital Stay (HOSPITAL_COMMUNITY): Payer: Medicare Other

## 2023-11-30 ENCOUNTER — Emergency Department (HOSPITAL_COMMUNITY): Payer: Medicare Other

## 2023-11-30 DIAGNOSIS — Z66 Do not resuscitate: Secondary | ICD-10-CM | POA: Diagnosis not present

## 2023-11-30 DIAGNOSIS — T796XXA Traumatic ischemia of muscle, initial encounter: Secondary | ICD-10-CM | POA: Diagnosis present

## 2023-11-30 DIAGNOSIS — I10 Essential (primary) hypertension: Secondary | ICD-10-CM | POA: Diagnosis present

## 2023-11-30 DIAGNOSIS — I2510 Atherosclerotic heart disease of native coronary artery without angina pectoris: Secondary | ICD-10-CM | POA: Diagnosis present

## 2023-11-30 DIAGNOSIS — I69393 Ataxia following cerebral infarction: Secondary | ICD-10-CM

## 2023-11-30 DIAGNOSIS — Z7982 Long term (current) use of aspirin: Secondary | ICD-10-CM

## 2023-11-30 DIAGNOSIS — R296 Repeated falls: Secondary | ICD-10-CM | POA: Diagnosis present

## 2023-11-30 DIAGNOSIS — L03116 Cellulitis of left lower limb: Secondary | ICD-10-CM | POA: Diagnosis present

## 2023-11-30 DIAGNOSIS — I110 Hypertensive heart disease with heart failure: Secondary | ICD-10-CM | POA: Diagnosis present

## 2023-11-30 DIAGNOSIS — Z8673 Personal history of transient ischemic attack (TIA), and cerebral infarction without residual deficits: Secondary | ICD-10-CM | POA: Diagnosis not present

## 2023-11-30 DIAGNOSIS — E785 Hyperlipidemia, unspecified: Secondary | ICD-10-CM | POA: Diagnosis present

## 2023-11-30 DIAGNOSIS — I4821 Permanent atrial fibrillation: Secondary | ICD-10-CM | POA: Diagnosis present

## 2023-11-30 DIAGNOSIS — M7989 Other specified soft tissue disorders: Secondary | ICD-10-CM | POA: Diagnosis present

## 2023-11-30 DIAGNOSIS — N400 Enlarged prostate without lower urinary tract symptoms: Secondary | ICD-10-CM | POA: Diagnosis present

## 2023-11-30 DIAGNOSIS — R269 Unspecified abnormalities of gait and mobility: Secondary | ICD-10-CM

## 2023-11-30 DIAGNOSIS — D7589 Other specified diseases of blood and blood-forming organs: Secondary | ICD-10-CM | POA: Diagnosis present

## 2023-11-30 DIAGNOSIS — Z952 Presence of prosthetic heart valve: Secondary | ICD-10-CM | POA: Diagnosis not present

## 2023-11-30 DIAGNOSIS — Z801 Family history of malignant neoplasm of trachea, bronchus and lung: Secondary | ICD-10-CM

## 2023-11-30 DIAGNOSIS — Z79899 Other long term (current) drug therapy: Secondary | ICD-10-CM | POA: Diagnosis not present

## 2023-11-30 DIAGNOSIS — Z888 Allergy status to other drugs, medicaments and biological substances status: Secondary | ICD-10-CM

## 2023-11-30 DIAGNOSIS — Z87891 Personal history of nicotine dependence: Secondary | ICD-10-CM | POA: Diagnosis not present

## 2023-11-30 DIAGNOSIS — D539 Nutritional anemia, unspecified: Secondary | ICD-10-CM | POA: Diagnosis present

## 2023-11-30 DIAGNOSIS — I5032 Chronic diastolic (congestive) heart failure: Secondary | ICD-10-CM | POA: Diagnosis present

## 2023-11-30 DIAGNOSIS — S270XXA Traumatic pneumothorax, initial encounter: Secondary | ICD-10-CM | POA: Diagnosis present

## 2023-11-30 DIAGNOSIS — F32A Depression, unspecified: Secondary | ICD-10-CM | POA: Diagnosis present

## 2023-11-30 DIAGNOSIS — S0093XA Contusion of unspecified part of head, initial encounter: Secondary | ICD-10-CM | POA: Diagnosis present

## 2023-11-30 DIAGNOSIS — I480 Paroxysmal atrial fibrillation: Secondary | ICD-10-CM | POA: Diagnosis not present

## 2023-11-30 DIAGNOSIS — W19XXXA Unspecified fall, initial encounter: Secondary | ICD-10-CM | POA: Diagnosis not present

## 2023-11-30 DIAGNOSIS — Z96652 Presence of left artificial knee joint: Secondary | ICD-10-CM | POA: Diagnosis present

## 2023-11-30 DIAGNOSIS — J189 Pneumonia, unspecified organism: Secondary | ICD-10-CM | POA: Diagnosis present

## 2023-11-30 DIAGNOSIS — N200 Calculus of kidney: Secondary | ICD-10-CM | POA: Diagnosis present

## 2023-11-30 DIAGNOSIS — M159 Polyosteoarthritis, unspecified: Secondary | ICD-10-CM | POA: Diagnosis present

## 2023-11-30 DIAGNOSIS — N179 Acute kidney failure, unspecified: Secondary | ICD-10-CM | POA: Diagnosis present

## 2023-11-30 DIAGNOSIS — I350 Nonrheumatic aortic (valve) stenosis: Secondary | ICD-10-CM | POA: Diagnosis present

## 2023-11-30 DIAGNOSIS — Z9181 History of falling: Secondary | ICD-10-CM

## 2023-11-30 DIAGNOSIS — L0390 Cellulitis, unspecified: Secondary | ICD-10-CM | POA: Diagnosis present

## 2023-11-30 DIAGNOSIS — Z7901 Long term (current) use of anticoagulants: Secondary | ICD-10-CM

## 2023-11-30 DIAGNOSIS — E538 Deficiency of other specified B group vitamins: Secondary | ICD-10-CM | POA: Diagnosis present

## 2023-11-30 DIAGNOSIS — Z7189 Other specified counseling: Secondary | ICD-10-CM | POA: Diagnosis not present

## 2023-11-30 DIAGNOSIS — Z981 Arthrodesis status: Secondary | ICD-10-CM

## 2023-11-30 DIAGNOSIS — J939 Pneumothorax, unspecified: Secondary | ICD-10-CM | POA: Insufficient documentation

## 2023-11-30 DIAGNOSIS — G894 Chronic pain syndrome: Secondary | ICD-10-CM | POA: Diagnosis present

## 2023-11-30 DIAGNOSIS — S2232XA Fracture of one rib, left side, initial encounter for closed fracture: Secondary | ICD-10-CM | POA: Diagnosis present

## 2023-11-30 DIAGNOSIS — Y92009 Unspecified place in unspecified non-institutional (private) residence as the place of occurrence of the external cause: Secondary | ICD-10-CM

## 2023-11-30 DIAGNOSIS — S2239XA Fracture of one rib, unspecified side, initial encounter for closed fracture: Secondary | ICD-10-CM | POA: Diagnosis present

## 2023-11-30 DIAGNOSIS — N323 Diverticulum of bladder: Secondary | ICD-10-CM | POA: Diagnosis present

## 2023-11-30 DIAGNOSIS — W07XXXA Fall from chair, initial encounter: Secondary | ICD-10-CM | POA: Diagnosis present

## 2023-11-30 LAB — URINALYSIS, ROUTINE W REFLEX MICROSCOPIC
Bacteria, UA: NONE SEEN
Bilirubin Urine: NEGATIVE
Glucose, UA: NEGATIVE mg/dL
Hgb urine dipstick: NEGATIVE
Ketones, ur: 20 mg/dL — AB
Leukocytes,Ua: NEGATIVE
Nitrite: NEGATIVE
Protein, ur: NEGATIVE mg/dL
Specific Gravity, Urine: 1.041 — ABNORMAL HIGH (ref 1.005–1.030)
pH: 6 (ref 5.0–8.0)

## 2023-11-30 LAB — BASIC METABOLIC PANEL
Anion gap: 11 (ref 5–15)
BUN: 23 mg/dL (ref 8–23)
CO2: 23 mmol/L (ref 22–32)
Calcium: 9.1 mg/dL (ref 8.9–10.3)
Chloride: 100 mmol/L (ref 98–111)
Creatinine, Ser: 1.04 mg/dL (ref 0.61–1.24)
GFR, Estimated: >60 mL/min (ref >60)
Glucose, Bld: 93 mg/dL (ref 70–99)
Potassium: 4.3 mmol/L (ref 3.5–5.1)
Sodium: 134 mmol/L — ABNORMAL LOW (ref 135–145)

## 2023-11-30 LAB — HEPATIC FUNCTION PANEL
ALT: 25 U/L (ref 0–44)
AST: 58 U/L — ABNORMAL HIGH (ref 15–41)
Albumin: 3 g/dL — ABNORMAL LOW (ref 3.5–5.0)
Alkaline Phosphatase: 78 U/L (ref 38–126)
Bilirubin, Direct: 0.3 mg/dL — ABNORMAL HIGH (ref 0.0–0.2)
Indirect Bilirubin: 1.1 mg/dL — ABNORMAL HIGH (ref 0.3–0.9)
Total Bilirubin: 1.4 mg/dL — ABNORMAL HIGH (ref <1.2)
Total Protein: 6.8 g/dL (ref 6.5–8.1)

## 2023-11-30 LAB — CBC
HCT: 44.3 % (ref 39.0–52.0)
Hemoglobin: 14.2 g/dL (ref 13.0–17.0)
MCH: 31.9 pg (ref 26.0–34.0)
MCHC: 32.1 g/dL (ref 30.0–36.0)
MCV: 99.6 fL (ref 80.0–100.0)
Platelets: 253 10*3/uL (ref 150–400)
RBC: 4.45 MIL/uL (ref 4.22–5.81)
RDW: 14.4 % (ref 11.5–15.5)
WBC: 11.2 10*3/uL — ABNORMAL HIGH (ref 4.0–10.5)
nRBC: 0 % (ref 0.0–0.2)

## 2023-11-30 LAB — I-STAT CG4 LACTIC ACID, ED: Lactic Acid, Venous: 1.9 mmol/L (ref 0.5–1.9)

## 2023-11-30 LAB — PROTIME-INR
INR: 1 (ref 0.8–1.2)
Prothrombin Time: 13.1 s (ref 11.4–15.2)

## 2023-11-30 LAB — CK: Total CK: 1065 U/L — ABNORMAL HIGH (ref 49–397)

## 2023-11-30 LAB — TROPONIN I (HIGH SENSITIVITY): Troponin I (High Sensitivity): 12 ng/L (ref <18)

## 2023-11-30 LAB — I-STAT CHEM 8, ED
BUN: 26 mg/dL — ABNORMAL HIGH (ref 8–23)
Calcium, Ion: 1.03 mmol/L — ABNORMAL LOW (ref 1.15–1.40)
Chloride: 103 mmol/L (ref 98–111)
Creatinine, Ser: 0.9 mg/dL (ref 0.61–1.24)
Glucose, Bld: 90 mg/dL (ref 70–99)
HCT: 46 % (ref 39.0–52.0)
Hemoglobin: 15.6 g/dL (ref 13.0–17.0)
Potassium: 4.5 mmol/L (ref 3.5–5.1)
Sodium: 136 mmol/L (ref 135–145)
TCO2: 23 mmol/L (ref 22–32)

## 2023-11-30 LAB — AMMONIA: Ammonia: 18 umol/L (ref 9–35)

## 2023-11-30 LAB — ETHANOL: Alcohol, Ethyl (B): <10 mg/dL (ref <10)

## 2023-11-30 LAB — LIPASE, BLOOD: Lipase: 21 U/L (ref 11–51)

## 2023-11-30 LAB — SAMPLE TO BLOOD BANK

## 2023-11-30 MED ORDER — IOHEXOL 350 MG/ML SOLN
75.0000 mL | Freq: Once | INTRAVENOUS | Status: AC | PRN
  Administered 2023-11-30: 75 mL via INTRAVENOUS

## 2023-11-30 MED ORDER — ONDANSETRON HCL 4 MG/2ML IJ SOLN
4.0000 mg | Freq: Once | INTRAMUSCULAR | Status: AC
  Administered 2023-11-30: 4 mg via INTRAVENOUS
  Filled 2023-11-30: qty 2

## 2023-11-30 MED ORDER — SODIUM CHLORIDE 0.9 % IV SOLN
2.0000 g | Freq: Once | INTRAVENOUS | Status: AC
  Administered 2023-11-30: 2 g via INTRAVENOUS
  Filled 2023-11-30: qty 20

## 2023-11-30 MED ORDER — APIXABAN 5 MG PO TABS: 5.0000 mg | ORAL_TABLET | Freq: Two times a day (BID) | ORAL | Status: DC

## 2023-11-30 MED ORDER — DOXYCYCLINE HYCLATE 100 MG PO TABS
100.0000 mg | ORAL_TABLET | Freq: Two times a day (BID) | ORAL | Status: DC
  Administered 2023-12-01 – 2023-12-03 (×5): 100 mg via ORAL
  Filled 2023-11-30 (×5): qty 1

## 2023-11-30 MED ORDER — BUPROPION HCL 100 MG PO TABS
100.0000 mg | ORAL_TABLET | Freq: Two times a day (BID) | ORAL | Status: DC
  Administered 2023-12-01 – 2023-12-03 (×5): 100 mg via ORAL
  Filled 2023-11-30 (×7): qty 1

## 2023-11-30 MED ORDER — SIMVASTATIN 20 MG PO TABS
40.0000 mg | ORAL_TABLET | Freq: Every day | ORAL | Status: DC
  Administered 2023-12-01 – 2023-12-02 (×2): 40 mg via ORAL
  Filled 2023-11-30 (×3): qty 2

## 2023-11-30 MED ORDER — TOPIRAMATE 25 MG PO TABS
25.0000 mg | ORAL_TABLET | Freq: Two times a day (BID) | ORAL | Status: DC
  Administered 2023-12-01 – 2023-12-02 (×4): 25 mg via ORAL
  Filled 2023-11-30 (×5): qty 1

## 2023-11-30 MED ORDER — ROPINIROLE HCL 0.25 MG PO TABS
0.2500 mg | ORAL_TABLET | Freq: Two times a day (BID) | ORAL | Status: DC
  Administered 2023-12-01 – 2023-12-03 (×5): 0.25 mg via ORAL
  Filled 2023-11-30 (×7): qty 1

## 2023-11-30 MED ORDER — ONDANSETRON HCL 4 MG PO TABS: 4.0000 mg | ORAL_TABLET | Freq: Four times a day (QID) | ORAL | Status: DC | PRN

## 2023-11-30 MED ORDER — METOPROLOL SUCCINATE ER 25 MG PO TB24
25.0000 mg | ORAL_TABLET | Freq: Every day | ORAL | Status: DC
  Administered 2023-12-01 – 2023-12-03 (×3): 25 mg via ORAL
  Filled 2023-11-30 (×3): qty 1

## 2023-11-30 MED ORDER — POLYETHYLENE GLYCOL 3350 17 G PO PACK: 17.0000 g | PACK | Freq: Every day | ORAL | Status: DC | PRN

## 2023-11-30 MED ORDER — DILTIAZEM HCL ER COATED BEADS 120 MG PO CP24
120.0000 mg | ORAL_CAPSULE | Freq: Every day | ORAL | Status: DC
  Administered 2023-12-01 – 2023-12-03 (×3): 120 mg via ORAL
  Filled 2023-11-30 (×3): qty 1

## 2023-11-30 MED ORDER — POTASSIUM CHLORIDE CRYS ER 20 MEQ PO TBCR
20.0000 meq | EXTENDED_RELEASE_TABLET | Freq: Every day | ORAL | Status: DC
  Administered 2023-12-01 – 2023-12-02 (×2): 20 meq via ORAL
  Filled 2023-11-30 (×2): qty 1

## 2023-11-30 MED ORDER — SUCRALFATE 1 G PO TABS
1.0000 g | ORAL_TABLET | Freq: Two times a day (BID) | ORAL | Status: DC
  Administered 2023-12-01 – 2023-12-03 (×5): 1 g via ORAL
  Filled 2023-11-30 (×6): qty 1

## 2023-11-30 MED ORDER — ASPIRIN 81 MG PO TBEC
81.0000 mg | DELAYED_RELEASE_TABLET | Freq: Every day | ORAL | Status: DC
  Administered 2023-12-01 – 2023-12-03 (×3): 81 mg via ORAL
  Filled 2023-11-30 (×3): qty 1

## 2023-11-30 MED ORDER — OXYCODONE-ACETAMINOPHEN 5-325 MG PO TABS
1.0000 | ORAL_TABLET | Freq: Every day | ORAL | Status: DC | PRN
  Administered 2023-11-30 – 2023-12-02 (×5): 1 via ORAL
  Filled 2023-11-30 (×5): qty 1

## 2023-11-30 MED ORDER — MORPHINE SULFATE (PF) 4 MG/ML IV SOLN
4.0000 mg | Freq: Once | INTRAVENOUS | Status: AC
  Administered 2023-11-30: 4 mg via INTRAVENOUS
  Filled 2023-11-30: qty 1

## 2023-11-30 MED ORDER — SODIUM CHLORIDE 0.9 % IV SOLN: INTRAVENOUS | Status: DC

## 2023-11-30 MED ORDER — VITAMIN C 500 MG PO TABS
250.0000 mg | ORAL_TABLET | Freq: Two times a day (BID) | ORAL | Status: DC
  Administered 2023-12-01 – 2023-12-03 (×5): 250 mg via ORAL
  Filled 2023-11-30 (×6): qty 1

## 2023-11-30 MED ORDER — OXYCODONE-ACETAMINOPHEN 10-325 MG PO TABS: 1.0000 | ORAL_TABLET | Freq: Every day | ORAL | Status: DC | PRN

## 2023-11-30 MED ORDER — TAMSULOSIN HCL 0.4 MG PO CAPS
0.4000 mg | ORAL_CAPSULE | Freq: Every day | ORAL | Status: DC
  Administered 2023-12-01 – 2023-12-03 (×3): 0.4 mg via ORAL
  Filled 2023-11-30 (×3): qty 1

## 2023-11-30 MED ORDER — FUROSEMIDE 20 MG PO TABS
20.0000 mg | ORAL_TABLET | Freq: Every day | ORAL | Status: DC
  Administered 2023-12-01: 20 mg via ORAL
  Filled 2023-11-30: qty 1

## 2023-11-30 MED ORDER — ALBUTEROL SULFATE (2.5 MG/3ML) 0.083% IN NEBU: 2.5000 mg | INHALATION_SOLUTION | RESPIRATORY_TRACT | Status: DC | PRN

## 2023-11-30 MED ORDER — ENOXAPARIN SODIUM 60 MG/0.6ML IJ SOSY
50.0000 mg | PREFILLED_SYRINGE | INTRAMUSCULAR | Status: DC
  Administered 2023-12-01 – 2023-12-03 (×3): 50 mg via SUBCUTANEOUS
  Filled 2023-11-30 (×4): qty 0.6

## 2023-11-30 MED ORDER — SPIRONOLACTONE 12.5 MG HALF TABLET
25.0000 mg | ORAL_TABLET | Freq: Every day | ORAL | Status: DC
  Administered 2023-12-01: 25 mg via ORAL
  Filled 2023-11-30: qty 2

## 2023-11-30 MED ORDER — SODIUM CHLORIDE 0.9 % IV SOLN
500.0000 mg | Freq: Once | INTRAVENOUS | Status: AC
  Administered 2023-11-30: 500 mg via INTRAVENOUS
  Filled 2023-11-30: qty 5

## 2023-11-30 MED ORDER — GABAPENTIN 400 MG PO CAPS
800.0000 mg | ORAL_CAPSULE | Freq: Three times a day (TID) | ORAL | Status: DC
  Administered 2023-12-01 (×2): 800 mg via ORAL
  Filled 2023-11-30 (×4): qty 2

## 2023-11-30 MED ORDER — ONDANSETRON HCL 4 MG/2ML IJ SOLN: 4.0000 mg | Freq: Four times a day (QID) | INTRAMUSCULAR | Status: DC | PRN

## 2023-11-30 MED ORDER — HYDRALAZINE HCL 20 MG/ML IJ SOLN: 10.0000 mg | Freq: Four times a day (QID) | INTRAMUSCULAR | Status: DC | PRN

## 2023-11-30 MED ORDER — OXYCODONE HCL 5 MG PO TABS
5.0000 mg | ORAL_TABLET | Freq: Every day | ORAL | Status: DC | PRN
  Administered 2023-11-30 – 2023-12-02 (×4): 5 mg via ORAL
  Filled 2023-11-30 (×4): qty 1

## 2023-11-30 MED ORDER — FERROUS SULFATE 325 (65 FE) MG PO TABS
325.0000 mg | ORAL_TABLET | Freq: Two times a day (BID) | ORAL | Status: DC
  Administered 2023-12-01 – 2023-12-03 (×5): 325 mg via ORAL
  Filled 2023-11-30 (×5): qty 1

## 2023-11-30 MED ORDER — CYANOCOBALAMIN 1000 MCG/ML IJ SOLN
1000.0000 ug | INTRAMUSCULAR | Status: DC
  Filled 2023-11-30: qty 1

## 2023-11-30 MED ORDER — METHOCARBAMOL 500 MG PO TABS
500.0000 mg | ORAL_TABLET | Freq: Two times a day (BID) | ORAL | Status: DC | PRN
  Administered 2023-12-01 – 2023-12-02 (×3): 500 mg via ORAL
  Filled 2023-11-30 (×3): qty 1

## 2023-11-30 NOTE — ED Provider Notes (Signed)
[place] EMERGENCY DEPARTMENT AT [facility] Provider Note   CSN: [id number] Arrival date & time: 11/30/23  1322     History  Chief Complaint  Patient presents with   [name] is a 82 y.o. male patient with past medical history of hyperlipidemia, hypertension, iron deficiency, stroke, A-fib on Eliquis presenting to emergency room today after a fall.  Patient reportedly had a witnessed fall 2 days ago.  He fell backward hitting his head on the chair breaking the chair in half.  Apparently since the fall he has not been acting at his baseline.  When EMS arrived to the house they report finding him on the ground at home.  Unclear what his baseline is at this time, however typically able to care for himself at home. Patient did not have reported loss of consciousness.  Patient is on Eliquis.  Patient admits to abdominal pain.   Fall Associated symptoms include abdominal pain.       Home Medications Prior to Admission medications   Medication Sig Start Date End Date Taking? Authorizing Provider  apixaban (ELIQUIS) 5 MG TABS tablet Take 1 tablet (5 mg total) by mouth 2 (two) times daily. 10/20/18   Leroy Sea, MD  ascorbic acid (VITAMIN C) 250 MG tablet Take 1 tablet by mouth 2 (two) times daily. 09/02/19   [provider]  aspirin EC 81 MG tablet Take 81 mg by mouth daily.    [provider]  buPROPion (WELLBUTRIN) 100 MG tablet Take 100 mg by mouth 2 (two) times daily.    [provider]  cyanocobalamin (,VITAMIN B-12,) 1000 MCG/ML injection Inject 1 mL into the muscle every 28 (twenty-eight) days. 03/15/20   [provider]  diltiazem (CARDIZEM CD) 120 MG 24 hr capsule Take 1 capsule (120 mg total) by mouth daily. 10/09/19   Kathlen Mody, MD  ferrous sulfate 325 (65 FE) MG tablet Take 325 mg by mouth 2 (two) times daily with a meal. 03/15/20   [provider]  furosemide (LASIX) 20 MG tablet Take 20 mg by mouth  daily. May take one additional tablet as needed for leg swelling.    [provider]  gabapentin (NEURONTIN) 800 MG tablet Take 800 mg by mouth 3 (three) times daily. 03/15/20   [provider]  methocarbamol (ROBAXIN) 500 MG tablet Take 1 tablet (500 mg total) by mouth 2 (two) times daily as needed for muscle spasms. 05/14/23   Jacalyn Lefevre, MD  metoprolol succinate (TOPROL-XL) 25 MG 24 hr tablet Take 25 mg by mouth daily.    [provider]  oxyCODONE-acetaminophen (PERCOCET) 10-325 MG tablet Take 1 tablet by mouth 5 (five) times daily as needed for pain.    [provider]  polyethylene glycol (MIRALAX / GLYCOLAX) packet Take 17 g by mouth daily as needed for mild constipation.    [provider]  potassium chloride (K-DUR,KLOR-CON) 20 MEQ tablet Take 1 tablet (20 mEq total) by mouth daily. Patient taking differently: Take 20 mEq by mouth at bedtime. 09/20/18   Strader, Lennart Pall, PA-C  rOPINIRole (REQUIP) 0.25 MG tablet Take 0.25 mg by mouth 2 (two) times daily.  10/09/18   [provider]  simvastatin (ZOCOR) 40 MG tablet Take 40 mg by mouth at bedtime.    [provider]  spironolactone (ALDACTONE) 25 MG tablet Take 25 mg by mouth daily.    [provider]  sucralfate (CARAFATE) 1 g tablet Take  1 g by mouth 2 (two) times daily. 06/03/23   [provider]  tamsulosin (FLOMAX) 0.4 MG CAPS capsule Take 0.4 mg by mouth daily. 09/02/19   [provider]  topiramate (TOPAMAX) 25 MG tablet Take 25 mg by mouth 2 (two) times daily.    [provider]      Allergies    Lorazepam, Duloxetine, Pregabalin, and Zanaflex [tizanidine hcl]    Review of Systems   Review of Systems  Gastrointestinal:  Positive for abdominal pain.    Physical Exam Updated Vital Signs BP 111/70   Pulse 98   Temp 97.7 F (36.5 C)   SpO2 100%  Physical Exam Vitals and nursing note reviewed.  Constitutional:       General: He is not in acute distress.    Appearance: He is not toxic-appearing.     Comments: Patient is alert and oriented to place and self only.  Patient is able to follow basic commands.  Patient able to localize pain.  HENT:     Head: Normocephalic and atraumatic.  Eyes:     General: No scleral icterus.    Conjunctiva/sclera: Conjunctivae normal.  Neck:     Comments: No cervical midline tenderness. Cardiovascular:     Rate and Rhythm: Normal rate and regular rhythm.     Pulses: Normal pulses.     Heart sounds: Normal heart sounds.  Pulmonary:     Effort: Pulmonary effort is normal. No respiratory distress.     Breath sounds: Normal breath sounds.  Abdominal:     General: Abdomen is flat. Bowel sounds are normal.     Palpations: Abdomen is soft.     Tenderness: There is no abdominal tenderness.  Musculoskeletal:     Right lower leg: No edema.     Left lower leg: No edema.  Skin:    General: Skin is warm and dry.     Findings: No lesion.     Comments: Obvious bruising over anterior aspect of neck. Bruising under right eye.   Neurological:     General: No focal deficit present.     Mental Status: He is alert and oriented to person, place, and time. Mental status is at baseline.     Comments: Able to move extremities without difficulty     ED Results / Procedures / Treatments   Labs (all labs ordered are listed, but only abnormal results are displayed) Labs Reviewed  CBC - Abnormal; Notable for the following components:      Result Value   WBC 11.2 (*)    All other components within normal limits  BASIC METABOLIC PANEL - Abnormal; Notable for the following components:   Sodium 134 (*)    All other components within normal limits  HEPATIC FUNCTION PANEL - Abnormal; Notable for the following components:   Albumin 3.0 (*)    AST 58 (*)    Total Bilirubin 1.4 (*)    Bilirubin, Direct 0.3 (*)    Indirect Bilirubin 1.1 (*)    All other components within normal limits   CK - Abnormal; Notable for the following components:   Total CK 1,065 (*)    All other components within normal limits  I-STAT CHEM 8, ED - Abnormal; Notable for the following components:   BUN 26 (*)    Calcium, Ion 1.03 (*)    All other components within normal limits  ETHANOL  PROTIME-INR  LIPASE, BLOOD  AMMONIA  URINALYSIS, ROUTINE W REFLEX MICROSCOPIC  I-STAT  CG4 LACTIC ACID, ED  SAMPLE TO BLOOD BANK  TROPONIN I (HIGH SENSITIVITY)    EKG None  Radiology No results found.  Procedures Procedures    Medications Ordered in ED Medications  morphine (PF) 4 MG/ML injection 4 mg (has no administration in time range)  ondansetron (ZOFRAN) injection 4 mg (has no administration in time range)    ED Course/ Medical Decision Making/ A&P Clinical Course as of 11/30/23 1756  Sat Nov 30, 2023  1538 Fall on eliquis Witnessed fall 2 days ago Has been altered since the fall EMS called again today, and pt was found down Will need admission [JR]  1641 Small PTX on L L 9th rib fx ?PNA in RUL [JR]  1701 Repeat CXR at 9 PM and another in the AM Trauma recommends admit to medicine, per Dr. Azucena Cecil [JR]    Clinical Course User Index [JR] Rolla Flatten, MD                                 Medical Decision Making Amount and/or Complexity of Data Reviewed Labs: ordered. Radiology: ordered.  Risk Prescription drug management. Decision regarding hospitalization.   Bernhardt Alzamora Estabrook 82 y.o. presented today for fall. Working DDx that I considered at this time includes, but not limited to, intracranial hemorrhage, subdural/epidural hematoma, vertebral fracture, spinal cord injury, muscle strain, skull fracture, fracture, splenic injury, liver injury, perforated viscus, contusions.  R/o DDx: These diagnoses are less consistent than current impression due to findings on history of present illness, physical exam, labs/imaging findings.  Review of prior external notes: None  Pmhx:  medical history of hyperlipidemia, hypertension, iron deficiency, stroke, A-fib on Eliquis   Unique Tests and My Interpretation:  Trauma labs ordered EKG: Rate, rhythm, axis, intervals all examined: a fib  Imaging:  Portable chest and abdomen pelvis -portable chest without acute abnormality, portable chest x-ray with findings suspicious for right upper lobe pneumonia.  Obtaining CT chest abdomen pelvis to rule out any other pathology thus I feel this will further characterize findings. CT chest abdomen pelvis PENDING CT head and cervical spine PENDING   Problem List / ED Course / Critical interventions / Medication management  Patient reporting to emergency room with fall.  Patient has been altered since a fall 2 days ago.  Patient hemodynamically stable, appears chronically ill.  Patient localizing pain around abdomen and chest.  I have ordered labs and imaging.  No obvious deformity on exam.  Patient is following basic commands A&O x 2, moving extremities without difficulty or pain.  Given patient's altered mental status I feel he would benefit from admission.  Dispo is pending scans. Patient's i-STAT chemistry without significant electrolyte abnormality nor AKI.  EtOH is less than 10, i-STAT lactic 1.9.  CBC with mild leukocytosis and no anemia.  UA is pending.  I have added on abdominal labs including hepatic function, lipase and ammonia.  These are pending.  EKG without significant abnormality from baseline.  I ordered medication including morphine  for pain  Reevaluation of the patient after these medicines showed that the patient stayed the same Patients vitals assessed. Upon arrival patient is hemodynamically stable.  I have reviewed the patients home medicines and have made adjustments as needed     Consult: None  Plan:  Signed off to oncoming resident. Patient dispo is pending imaging.  Patient does have altered mental status so I anticipate patient will require  admission.         Final Clinical Impression(s) / ED Diagnoses Final diagnoses:  Fall, initial encounter  Traumatic rhabdomyolysis, initial encounter [facility])  Traumatic pneumothorax, initial encounter  Closed fracture of one rib of left side, initial encounter  Community acquired pneumonia of right upper lobe of lung    Rx / DC Orders ED Discharge Orders     None         Raford Pitcher, Evalee Jefferson 11/30/23 1802    Arby Barrette, MD 11/30/23 1843

## 2023-11-30 NOTE — ED Triage Notes (Addendum)
Pt BIB [facility] with AMS. Pt has had multiple falls over the past 2 days. Pt fell 2 days ago and struck his head on the chair and broke the chair in half, sister who lives with him reported that he has been altered since. Pt on eliquis  180/114 130 CBG 111 Temp 96.8

## 2023-11-30 NOTE — ED Provider Notes (Signed)
I saw and evaluated the patient, reviewed the resident's note and I agree with the findings and plan.  EKG Interpretation Date/Time:  Saturday November 30 2023 13:39:54 EST Ventricular Rate:  85 PR Interval:    QRS Duration:  91 QT Interval:  416 QTC Calculation: 495 R Axis:   217  Text Interpretation: Atrial fibrillation Anterior infarct, old No significant change since last tracing Confirmed by Elayne Snare ([id number]) on 11/30/2023 2:43:02 PM   Patient's care initiated by PA-C Barrett's.  Care assumed and supervised by myself.  I have personally reviewed diagnostic studies and reassessed the patient.  At this time he will require admission.  Patient's airway is stable.  He does not have any respiratory distress.  He has significant ecchymosis of the face and neck.  No difficulty swallowing or handing secretions.  Agree with plan of management.   Arby Barrette, MD 11/30/23 [phone]

## 2023-11-30 NOTE — Progress Notes (Signed)
Orthopedic Tech Progress Note Patient Details:  [name] May 04, 1941 [id number] Level 2 Trauma  Patient ID: [name], male   DOB: 1941-06-28, 82 y.o.   MRN: [id number]  Smitty Pluck 11/30/2023, 2:05 PM

## 2023-11-30 NOTE — ED Notes (Signed)
ED TO INPATIENT HANDOFF REPORT  ED Nurse Name and Phone #: Darral Dash RN [phone]  S Name/Age/Gender [name] 82 y.o. male Room/Bed: 020C/020C  Code Status   Code Status: Limited: Do not attempt resuscitation (DNR) -DNR-LIMITED -Do Not Intubate/DNI   Home/SNF/Other Home Patient oriented to: self, place, and situation Is this baseline? Yes   Triage Complete: Triage complete  Chief Complaint Fall [W19.XXXA]  Triage Note Pt BIB [facility] with AMS. Pt has had multiple falls over the past 2 days. Pt fell 2 days ago and struck his head on the chair and broke the chair in half, sister who lives with him reported that he has been altered since. Pt on eliquis  180/114 130 CBG 111 Temp 96.8   Allergies Allergies  Allergen Reactions   Lorazepam Other (See Comments)    Delirium   Duloxetine Other (See Comments)    Increased depression, irritability   Pregabalin Swelling and Other (See Comments)    Flashbacks, nightmares, aggression   Zanaflex [Tizanidine Hcl] Swelling and Anxiety    Level of Care/Admitting Diagnosis ED Disposition     ED Disposition  Admit   Condition  --   Comment  Hospital Area: [facility] [[id number]]  Level of Care: Telemetry Medical [104]  May admit patient to [facility] or [facility] if equivalent level of care is available:: Yes  Covid Evaluation: Asymptomatic - no recent exposure (last 10 days) testing not required  Diagnosis: Fall [[id number]]  Admitting Physician: Alessandra Bevels [[id number]]  Attending Physician: Alessandra Bevels [[id number]]  Certification:: I certify this patient will need inpatient services for at least 2 midnights  Expected Medical Readiness: 12/03/2023          B Medical/Surgery History Past Medical History:  Diagnosis Date   Cancer (HCC)    Carpal tunnel syndrome    Cervical spondylosis with myelopathy    Chronic pain syndrome    Depression    Difficult intubation    needs smaller tube; awake oral  fiberoptic scope 8.0 ETT 03/30/08   Dysphagia, pharyngeal phase    Hemorrhage of gastrointestinal tract, unspecified    History of cardiac cath    a. 03/2018 Cath: LAD and LCX with mild luminal irregularities.  No significant disease.   Iron deficiency anemia, unspecified    Lumbosacral spondylosis without myelopathy    Muscle weakness (generalized)    Other and unspecified disc disorder of cervical region    Other and unspecified hyperlipidemia    Patent foramen ovale    a. 05/2018 Echo: post-op TAVR--> + bublble study w/ L->R shunt.   Permanent atrial fibrillation (HCC)    a.  Diagnosed in the spring 2019.  Had rapid atrial fibrillation following TAVR and has been rate controlled with beta-blocker.  CHA2DS2VASc equals 7.  Supposed to be on Eliquis.   Primary localized osteoarthrosis, lower leg    Primary localized osteoarthrosis, shoulder region    Reflux esophagitis    Severe aortic stenosis    a. mild-mod AS by 01/2016 TTE, restricted AV opening with mod AR 01/2016 TEE; b. 10/2017 Echo: EF 60-65%, sev Ca2+ AoV, mean grad ; c. 05/2018 s/p TAVR ([facility]); d. 06/2018 Echo: EF 55-60%, well-seated prosth AoV, peak velocity 156cm/s. AoV gradient .   Spinal stenosis, unspecified region other than cervical    Stroke (HCC)    01/2016   Syncope and collapse    Unspecified arthropathy, lower leg    Unspecified constipation    Unspecified essential hypertension  Unspecified glaucoma(365.9)    Past Surgical History:  Procedure Laterality Date   BILATERAL CATARACT SURGERY  2009   DR GROAT    BIOPSY  10/14/2018   Procedure: BIOPSY;  Surgeon: Graylin Shiver, MD;  Location: [facility] ENDOSCOPY;  Service: Endoscopy;;   CARDIAC VALVE REPLACEMENT  05/2018   CERVIACAL SPINE (4X)     DR MARK ROY    COLONOSCOPY  2007   DR HENSEL    COLONOSCOPY WITH PROPOFOL N/A 11/20/2017   Procedure: COLONOSCOPY WITH PROPOFOL;  Surgeon: Charlott Rakes, MD;  Location: [facility] ENDOSCOPY;  Service: Endoscopy;  Laterality:  N/A;   ESOPHAGOGASTRODUODENOSCOPY (EGD) WITH PROPOFOL N/A 10/14/2018   Procedure: ESOPHAGOGASTRODUODENOSCOPY (EGD) WITH PROPOFOL;  Surgeon: Graylin Shiver, MD;  Location: [facility] ENDOSCOPY;  Service: Endoscopy;  Laterality: N/A;   JOINT REPLACEMENT     both knees   LEFT KNEE REPLACEMENT     DR ALUSIO   LEFT TRANSVERSE CARPAL LIGAMENT  01/06/2008   ROTATOR CUFF LEFT SHOULDER  2001   DR MURPHY    SHOULDER OPEN ROTATOR CUFF REPAIR  2006   DR [facility]   SPINE SURGERY     neck fusion     A IV Location/Drains/Wounds Patient Lines/Drains/Airways Status     Active Line/Drains/Airways     Name Placement date Placement time Site Days   Peripheral IV 11/30/23 20 G Right Antecubital 11/30/23  1417  Antecubital  less than 1   Wound / Incision (Open or Dehisced) 10/26/23 Non-pressure wound Leg Bilateral scattered abrasions 10/26/23  2000  Leg  35            Intake/Output Last 24 hours No intake or output data in the 24 hours ending 11/30/23 1911  Labs/Imaging Results for orders placed or performed during the hospital encounter of 11/30/23 (from the past 48 hours)  CBC     Status: Abnormal   Collection Time: 11/30/23  2:18 PM  Result Value Ref Range   WBC 11.2 (H) 4.0 - 10.5 K/uL   RBC 4.45 4.22 - 5.81 MIL/uL   Hemoglobin 14.2 13.0 - 17.0 g/dL   HCT 86.5 78.4 - 69.6 %   MCV 99.6 80.0 - 100.0 fL   MCH 31.9 26.0 - 34.0 pg   MCHC 32.1 30.0 - 36.0 g/dL   RDW 29.5 28.4 - 13.2 %   Platelets 253 150 - 400 K/uL   nRBC 0.0 0.0 - 0.2 %    Comment: Performed at [facility], [address], [place], [place] [address]  Basic metabolic panel     Status: Abnormal   Collection Time: 11/30/23  2:18 PM  Result Value Ref Range   Sodium 134 (L) 135 - 145 mmol/L   Potassium 4.3 3.5 - 5.1 mmol/L   Chloride 100 98 - 111 mmol/L   CO2 23 22 - 32 mmol/L   Glucose, Bld 93 70 - 99 mg/dL    Comment: Glucose reference range applies only to samples taken after fasting for at least 8 hours.   BUN 23  8 - 23 mg/dL   Creatinine, Ser 2.72 0.61 - 1.24 mg/dL   Calcium 9.1 8.9 - 53.6 mg/dL   GFR, Estimated >64 >40 mL/min    Comment: (NOTE) Calculated using the CKD-EPI Creatinine Equation (2021)    Anion gap 11 5 - 15    Comment: Performed at [facility], [address], [place], [place] [address]  Ethanol     Status: None   Collection Time:  11/30/23  2:18 PM  Result Value Ref Range   Alcohol, Ethyl (B) <10 <10 mg/dL    Comment: (NOTE) Lowest detectable limit for serum alcohol is 10 mg/dL.  For medical purposes only. Performed at [facility], [address] [place] [address] [place], [place] [address]   Protime-INR     Status: None   Collection Time: 11/30/23  2:18 PM  Result Value Ref Range   Prothrombin Time 13.1 11.4 - 15.2 seconds   INR 1.0 0.8 - 1.2    Comment: (NOTE) INR goal varies based on device and disease states. Performed at [facility], [address] [place], [place] [address]   Sample to Blood Bank     Status: None   Collection Time: 11/30/23  2:18 PM  Result Value Ref Range   Blood Bank Specimen SAMPLE AVAILABLE FOR TESTING    Sample Expiration      12/03/2023,2359 Performed at [facility], [address], [place], [place] [address]   I-Stat Lactic Acid, ED     Status: None   Collection Time: 11/30/23  2:41 PM  Result Value Ref Range   Lactic Acid, Venous 1.9 0.5 - 1.9 mmol/L  I-Stat Chem 8, ED     Status: Abnormal   Collection Time: 11/30/23  2:42 PM  Result Value Ref Range   Sodium 136 135 - 145 mmol/L   Potassium 4.5 3.5 - 5.1 mmol/L   Chloride 103 98 - 111 mmol/L   BUN 26 (H) 8 - 23 mg/dL   Creatinine, Ser 9.14 0.61 - 1.24 mg/dL   Glucose, Bld 90 70 - 99 mg/dL    Comment: Glucose reference range applies only to samples taken after fasting for at least 8 hours.   Calcium, Ion 1.03 (L) 1.15 - 1.40 mmol/L   TCO2 23 22 - 32 mmol/L   Hemoglobin 15.6 13.0 - 17.0 g/dL   HCT 78.2 95.6 - 21.3 %  Lipase, blood     Status: None   Collection  Time: 11/30/23  4:06 PM  Result Value Ref Range   Lipase 21 11 - 51 U/L    Comment: Performed at [facility], [address] [place], [place] [address]  Ammonia     Status: None   Collection Time: 11/30/23  4:06 PM  Result Value Ref Range   Ammonia 18 9 - 35 umol/L    Comment: Performed at [facility], [address] [place], [place] [address]  Hepatic function panel     Status: Abnormal   Collection Time: 11/30/23  4:06 PM  Result Value Ref Range   Total Protein 6.8 6.5 - 8.1 g/dL   Albumin 3.0 (L) 3.5 - 5.0 g/dL   AST 58 (H) 15 - 41 U/L   ALT 25 0 - 44 U/L   Alkaline Phosphatase 78 38 - 126 U/L   Total Bilirubin 1.4 (H) <1.2 mg/dL   Bilirubin, Direct 0.3 (H) 0.0 - 0.2 mg/dL   Indirect Bilirubin 1.1 (H) 0.3 - 0.9 mg/dL    Comment: Performed at [facility], [address], [place], [place] [address]  CK     Status: Abnormal   Collection Time: 11/30/23  4:06 PM  Result Value Ref Range   Total CK 1,065 (H) 49 - 397 U/L    Comment: Performed at [facility], [address], [place], [place] [address]  Troponin I (High Sensitivity)     Status: None  Collection Time: 11/30/23  4:06 PM  Result Value Ref Range   Troponin I (High Sensitivity) 12 <18 ng/L    Comment: (NOTE) Elevated high sensitivity troponin I (hsTnI) values and significant  changes across serial measurements may suggest ACS but many other  chronic and acute conditions are known to elevate hsTnI results.  Refer to the "Links" section for chest pain algorithms and additional  guidance. Performed at [facility], [address], [place], [place] [address]    CT CHEST ABDOMEN PELVIS W CONTRAST Result Date: 11/30/2023 CLINICAL DATA:  Ground level fall on blood thinners altered EXAM: CT CHEST, ABDOMEN, AND PELVIS WITH CONTRAST TECHNIQUE: Multidetector CT imaging of the chest, abdomen and pelvis was performed following the standard protocol during bolus administration of intravenous  contrast. RADIATION DOSE REDUCTION: This exam was performed according to the departmental dose-optimization program which includes automated exposure control, adjustment of the mA and/or kV according to patient size and/or use of iterative reconstruction technique. CONTRAST:  75mL OMNIPAQUE IOHEXOL 350 MG/ML SOLN COMPARISON:  Radiographs 11/30/2023, chest CT 10/26/2023, CT chest abdomen pelvis 05/14/2023 FINDINGS: CT CHEST FINDINGS Cardiovascular: Nonaneurysmal aorta. Mild atherosclerosis. No dissection. Normal contour. Aortic valve prosthesis. No pericardial effusion Mediastinum/Nodes: Patent trachea. No thyroid mass. No suspicious lymph nodes. Esophagus within normal limits. Lungs/Pleura: Clearing of previously noted left lower lobe pneumonia. Interim development of patchy ground-glass disease in the right upper lobe. Tiny air within the pleural space at the left apex. Small pneumothorax at the left anterior lung base. Musculoskeletal: Advanced multilevel degenerative changes. Posterior spinal hardware T10 and T11 with osseous fusion. Acute displaced left ninth posterolateral rib fracture. Multiple old appearing bilateral rib fractures. Gynecomastia. CT ABDOMEN PELVIS FINDINGS Hepatobiliary: No focal liver abnormality is seen. No gallstones, gallbladder wall thickening, or biliary dilatation. Pancreas: Unremarkable. No pancreatic ductal dilatation or surrounding inflammatory changes. Spleen: Normal in size without focal abnormality. Adrenals/Urinary Tract: Adrenal glands are normal. Small nonobstructing right kidney stone. Bilateral renal cysts for which no imaging follow-up is recommended. No hydronephrosis. Multiple small bladder diverticula. Bilateral renal cortical atrophy. Stomach/Bowel: Postsurgical changes of the stomach. No dilated small bowel. No acute bowel wall thickening. Negative appendix Vascular/Lymphatic: Moderate aortic atherosclerosis. No aneurysm. No suspicious lymph nodes. Reproductive:  Negative prostate Other: Negative for free air or free fluid. Small fat containing inguinal hernias.Moderate fat containing supraumbilical ventral hernia. Small fat containing umbilical hernia. Musculoskeletal: Multilevel lumbar degenerative change. No acute osseous abnormality. IMPRESSION: 1. Acute displaced left ninth posterolateral rib fracture. Trace left pneumothorax. 2. Interim development of patchy ground-glass disease in the right upper lobe, suspicious for pneumonia. Clearing of previously noted left lower lobe airspace disease. 3. No CT evidence for acute intra-abdominal or pelvic abnormality. 4. Small nonobstructing right kidney stone. Multiple small bladder diverticula. 5. Aortic atherosclerosis. Aortic Atherosclerosis (ICD10-I70.0). Electronically Signed   By: Jasmine Pang M.D.   On: 11/30/2023 16:42   CT Head Wo Contrast Result Date: 11/30/2023 CLINICAL DATA:  Head trauma, moderate-severe; Neck trauma (Age >= 65y) EXAM: CT HEAD WITHOUT CONTRAST CT CERVICAL SPINE WITHOUT CONTRAST TECHNIQUE: Multidetector CT imaging of the head and cervical spine was performed following the standard protocol without intravenous contrast. Multiplanar CT image reconstructions of the cervical spine were also generated. RADIATION DOSE REDUCTION: This exam was performed according to the departmental dose-optimization program which includes automated exposure control, adjustment of the mA and/or kV according to patient size and/or use of iterative reconstruction technique. COMPARISON:  None Available. FINDINGS: CT HEAD FINDINGS Brain: No evidence  of acute infarction, hemorrhage, hydrocephalus, extra-axial collection or mass lesion/mass effect. Vascular: Calcific atherosclerosis. Skull: No acute fracture. Sinuses/Orbits: No acute abnormality. Other: No mastoid effusions. CT CERVICAL SPINE FINDINGS Alignment: Similar alignment in comparison to the prior. No new sagittal subluxation. Skull base and vertebrae: Motion limited  assessment without evidence of acute fracture. Extensive ankylosis throughout the cervical spine with skull base to T1 posterior fusion at C6-C7 ACDF. Osteopenia. Soft tissues and spinal canal: No prevertebral fluid or swelling. No visible canal hematoma. Disc levels: Severe multilevel bony degenerative change, not substantially changed. Upper chest: Further evaluated on same day CT chest/abdomen/pelvis. IMPRESSION: No evidence of acute abnormality intracranially or in the cervical spine. Electronically Signed   By: Feliberto Harts M.D.   On: 11/30/2023 15:32   CT Cervical Spine Wo Contrast Result Date: 11/30/2023 CLINICAL DATA:  Head trauma, moderate-severe; Neck trauma (Age >= 65y) EXAM: CT HEAD WITHOUT CONTRAST CT CERVICAL SPINE WITHOUT CONTRAST TECHNIQUE: Multidetector CT imaging of the head and cervical spine was performed following the standard protocol without intravenous contrast. Multiplanar CT image reconstructions of the cervical spine were also generated. RADIATION DOSE REDUCTION: This exam was performed according to the departmental dose-optimization program which includes automated exposure control, adjustment of the mA and/or kV according to patient size and/or use of iterative reconstruction technique. COMPARISON:  None Available. FINDINGS: CT HEAD FINDINGS Brain: No evidence of acute infarction, hemorrhage, hydrocephalus, extra-axial collection or mass lesion/mass effect. Vascular: Calcific atherosclerosis. Skull: No acute fracture. Sinuses/Orbits: No acute abnormality. Other: No mastoid effusions. CT CERVICAL SPINE FINDINGS Alignment: Similar alignment in comparison to the prior. No new sagittal subluxation. Skull base and vertebrae: Motion limited assessment without evidence of acute fracture. Extensive ankylosis throughout the cervical spine with skull base to T1 posterior fusion at C6-C7 ACDF. Osteopenia. Soft tissues and spinal canal: No prevertebral fluid or swelling. No visible canal  hematoma. Disc levels: Severe multilevel bony degenerative change, not substantially changed. Upper chest: Further evaluated on same day CT chest/abdomen/pelvis. IMPRESSION: No evidence of acute abnormality intracranially or in the cervical spine. Electronically Signed   By: Feliberto Harts M.D.   On: 11/30/2023 15:32   DG Pelvis Portable Result Date: 11/30/2023 CLINICAL DATA:  Fall EXAM: PORTABLE PELVIS 1-2 VIEWS COMPARISON:  08/09/2023 FINDINGS: There is no evidence of pelvic fracture or diastasis. Mild degenerative changes of both hips without evidence of fracture or dislocation. No pelvic bone lesions are seen. IMPRESSION: Negative. Electronically Signed   By: Duanne Guess D.O.   On: 11/30/2023 14:59   DG Chest Portable 1 View Result Date: 11/30/2023 CLINICAL DATA:  Chest pain, fall EXAM: PORTABLE CHEST 1 VIEW COMPARISON:  10/26/2023 FINDINGS: Stable cardiomegaly. Aortic atherosclerosis. Subtle airspace opacity in the right upper lobe. No pleural effusion or pneumothorax. IMPRESSION: Subtle airspace opacity in the right upper lobe, which may represent atelectasis or pneumonia. Electronically Signed   By: Duanne Guess D.O.   On: 11/30/2023 14:58    Pending Labs Unresulted Labs (From admission, onward)     Start     Ordered   12/07/23 0500  Creatinine, serum  (enoxaparin (LOVENOX)    CrCl >/= 30 ml/min)  Weekly,   R     Comments: while on enoxaparin therapy    11/30/23 1847   12/01/23 0500  Basic metabolic panel  Tomorrow morning,   R        11/30/23 1847   12/01/23 0500  CBC  Tomorrow morning,   R  11/30/23 1847   11/30/23 1845  CBC  (enoxaparin (LOVENOX)    CrCl >/= 30 ml/min)  Once,   R       Comments: Baseline for enoxaparin therapy IF NOT ALREADY DRAWN.  Notify MD if PLT < 100 K.    11/30/23 1847   11/30/23 1845  Creatinine, serum  (enoxaparin (LOVENOX)    CrCl >/= 30 ml/min)  Once,   R       Comments: Baseline for enoxaparin therapy IF NOT ALREADY DRAWN.     11/30/23 1847   11/30/23 1401  Urinalysis, Routine w reflex microscopic -Urine, Clean Catch  [facility] ED TRAUMA PANEL [facility])  Once,   URGENT       Question:  Specimen Source  Answer:  Urine, Clean Catch   11/30/23 1400            Vitals/Pain Today's Vitals   11/30/23 1330 11/30/23 1515 11/30/23 1718  BP: 111/70 127/82 114/70  Pulse: 98 100 91  Resp:  20 (!) 24  Temp: 97.7 F (36.5 C)  97.7 F (36.5 C)  TempSrc:   Oral  SpO2: 100% 99% 100%    Isolation Precautions No active isolations  Medications Medications  azithromycin (ZITHROMAX) 500 mg in sodium chloride 0.9 % 250 mL IVPB (500 mg Intravenous New Bag/Given 11/30/23 1856)  aspirin EC tablet 81 mg (has no administration in time range)  oxyCODONE-acetaminophen (PERCOCET) 10-325 MG per tablet 1 tablet (has no administration in time range)  diltiazem (CARDIZEM CD) 24 hr capsule 120 mg (has no administration in time range)  furosemide (LASIX) tablet 20 mg (has no administration in time range)  metoprolol succinate (TOPROL-XL) 24 hr tablet 25 mg (has no administration in time range)  simvastatin (ZOCOR) tablet 40 mg (has no administration in time range)  spironolactone (ALDACTONE) tablet 25 mg (has no administration in time range)  buPROPion (WELLBUTRIN) tablet 100 mg (has no administration in time range)  polyethylene glycol (MIRALAX / GLYCOLAX) packet 17 g (has no administration in time range)  sucralfate (CARAFATE) tablet 1 g (has no administration in time range)  tamsulosin (FLOMAX) capsule 0.4 mg (has no administration in time range)  cyanocobalamin (VITAMIN B12) injection 1,000 mcg (has no administration in time range)  ferrous sulfate tablet 325 mg (has no administration in time range)  gabapentin (NEURONTIN) tablet 800 mg (has no administration in time range)  methocarbamol (ROBAXIN) tablet 500 mg (has no administration in time range)  rOPINIRole (REQUIP) tablet 0.25 mg (has no administration in time range)  topiramate  (TOPAMAX) tablet 25 mg (has no administration in time range)  ascorbic acid (VITAMIN C) tablet 250 mg (has no administration in time range)  potassium chloride SA (KLOR-CON M) CR tablet 20 mEq (has no administration in time range)  doxycycline (VIBRA-TABS) tablet 100 mg (has no administration in time range)  enoxaparin (LOVENOX) injection 40 mg (has no administration in time range)  ondansetron (ZOFRAN) tablet 4 mg (has no administration in time range)    Or  ondansetron (ZOFRAN) injection 4 mg (has no administration in time range)  albuterol (PROVENTIL) (2.5 MG/3ML) 0.083% nebulizer solution 2.5 mg (has no administration in time range)  hydrALAZINE (APRESOLINE) injection 10 mg (has no administration in time range)  morphine (PF) 4 MG/ML injection 4 mg (4 mg Intravenous Given 11/30/23 1430)  ondansetron (ZOFRAN) injection 4 mg (4 mg Intravenous Given 11/30/23 1430)  iohexol (OMNIPAQUE) 350 MG/ML injection 75 mL (75 mLs Intravenous Contrast Given 11/30/23 1501)  cefTRIAXone (ROCEPHIN)  2 g in sodium chloride 0.9 % 100 mL IVPB (0 g Intravenous Stopped 11/30/23 1801)    Mobility walks with device     Focused Assessments Fall assessment   R Recommendations: See Admitting Provider Note  Report given to:   Additional Notes: patient is A/Ox3 at baseline, patient is able to answer questions. He is able to turn side to side for you if he needs to be changed.

## 2023-11-30 NOTE — H&P (Addendum)
History and Physical    DOA: 11/30/2023  PCP: Dois Davenport, MD  Patient coming from: home  Chief Complaint: fall  HPI: [name] is a 82 y.o. male with history h/o HTN, PAF on anticoagulation with Eliquis, PFO, aortic stenosis status post TAVR, CAD, spinal stenosis, CVA, chronic back pain, multiple back surgeries presents to the ED in concern for recurrent falls.  Patient's sister who is the primary caregiver is at bedside and provides most of the history.  She states patient has had on and off falls over the last couple of months.  Patient at baseline uses a walker to ambulate with the primary morbidity being chronic back pain and multiple back surgeries.  He is able to recognize family members and usually can tell where he is at, but she states she would not be surprised if he cannot name the month or the year or president or other details.  He apparently does not like to come to the hospital and had discouraged sister from calling EMS previously when he fell.  Past Wednesday (12/11), patient apparently was sitting on a chair and using the foot massager --he leaned back on the chair which hit the wall behind and broke in half.  Sister denies any head injury at that time.  When asked about the facial bruises noted today, she states that was from a fall prior.  This morning, sister found patient sitting on the floor when she was walking to the den to let the dogs out.  She states his walker was on the side.  She was not sure how he got on the floor but apparently was confused-did not have his clothes on and rambling.  She brought him to the ED for further evaluation and management. ED course: Afebrile, pulse 91, respiratory rate 24, BP 114/70, O2 sat 100% on room air.  WBC 11.2, hemoglobin 14.2, HCT 44, PLT 253, sodium 134, potassium 4.3, chloride 100, bicarb 23, BUN 23, creatinine 1.04, calcium 9.1, INR 1.0, glucose 93.  Troponin I 12, CK1 065, albumin 3.0, AST 58, total bili 1.4, direct 0.3.   Trauma workup revealed left ninth displaced rib fracture, trace left pneumothorax at the left anterior lung base and right upper lobe patchy groundglass opacity.  CT head unremarkable.   Review of Systems: As per HPI, otherwise review of systems negative.    Past Medical History:  Diagnosis Date   Cancer [facility])    Carpal tunnel syndrome    Cervical spondylosis with myelopathy    Chronic pain syndrome    Depression    Difficult intubation    needs smaller tube; awake oral fiberoptic scope 8.0 ETT 03/30/08   Dysphagia, pharyngeal phase    Hemorrhage of gastrointestinal tract, unspecified    History of cardiac cath    a. 03/2018 Cath: LAD and LCX with mild luminal irregularities.  No significant disease.   Iron deficiency anemia, unspecified    Lumbosacral spondylosis without myelopathy    Muscle weakness (generalized)    Other and unspecified disc disorder of cervical region    Other and unspecified hyperlipidemia    Patent foramen ovale    a. 05/2018 Echo: post-op TAVR--> + bublble study w/ L->R shunt.   Permanent atrial fibrillation (HCC)    a.  Diagnosed in the spring 2019.  Had rapid atrial fibrillation following TAVR and has been rate controlled with beta-blocker.  CHA2DS2VASc equals 7.  Supposed to be on Eliquis.   Primary localized osteoarthrosis, lower leg  Primary localized osteoarthrosis, shoulder region    Reflux esophagitis    Severe aortic stenosis    a. mild-mod AS by 01/2016 TTE, restricted AV opening with mod AR 01/2016 TEE; b. 10/2017 Echo: EF 60-65%, sev Ca2+ AoV, mean grad ; c. 05/2018 s/p TAVR ([facility]); d. 06/2018 Echo: EF 55-60%, well-seated prosth AoV, peak velocity 156cm/s. AoV gradient .   Spinal stenosis, unspecified region other than cervical    Stroke (HCC)    01/2016   Syncope and collapse    Unspecified arthropathy, lower leg    Unspecified constipation    Unspecified essential hypertension    Unspecified glaucoma(365.9)     Past Surgical  History:  Procedure Laterality Date   BILATERAL CATARACT SURGERY  2009   DR GROAT    BIOPSY  10/14/2018   Procedure: BIOPSY;  Surgeon: Graylin Shiver, MD;  Location: [facility] ENDOSCOPY;  Service: Endoscopy;;   CARDIAC VALVE REPLACEMENT  05/2018   CERVIACAL SPINE (4X)     DR MARK ROY    COLONOSCOPY  2007   DR HENSEL    COLONOSCOPY WITH PROPOFOL N/A 11/20/2017   Procedure: COLONOSCOPY WITH PROPOFOL;  Surgeon: Charlott Rakes, MD;  Location: [facility] ENDOSCOPY;  Service: Endoscopy;  Laterality: N/A;   ESOPHAGOGASTRODUODENOSCOPY (EGD) WITH PROPOFOL N/A 10/14/2018   Procedure: ESOPHAGOGASTRODUODENOSCOPY (EGD) WITH PROPOFOL;  Surgeon: Graylin Shiver, MD;  Location: [facility] ENDOSCOPY;  Service: Endoscopy;  Laterality: N/A;   JOINT REPLACEMENT     both knees   LEFT KNEE REPLACEMENT     DR ALUSIO   LEFT TRANSVERSE CARPAL LIGAMENT  01/06/2008   ROTATOR CUFF LEFT SHOULDER  2001   DR MURPHY    SHOULDER OPEN ROTATOR CUFF REPAIR  2006   DR [facility]   SPINE SURGERY     neck fusion    Social history:  reports that he quit smoking about 38 years ago. His smoking use included cigarettes. He quit smokeless tobacco use about 38 years ago. He reports that he does not drink alcohol and does not use drugs.   Allergies  Allergen Reactions   Lorazepam Other (See Comments)    Delirium   Duloxetine Other (See Comments)    Increased depression, irritability   Pregabalin Swelling and Other (See Comments)    Flashbacks, nightmares, aggression   Zanaflex [Tizanidine Hcl] Swelling and Anxiety    Family History  Problem Relation Age of Onset   Cancer Mother 66       cervical cancer   Cancer Father 48       lung  cancer      Prior to Admission medications   Medication Sig Start Date End Date Taking? Authorizing Provider  apixaban (ELIQUIS) 5 MG TABS tablet Take 1 tablet (5 mg total) by mouth 2 (two) times daily. 10/20/18   Leroy Sea, MD  ascorbic acid (VITAMIN C) 250 MG tablet Take 1 tablet by mouth 2  (two) times daily. 09/02/19   [provider]  aspirin EC 81 MG tablet Take 81 mg by mouth daily.    [provider]  buPROPion (WELLBUTRIN) 100 MG tablet Take 100 mg by mouth 2 (two) times daily.    [provider]  cyanocobalamin (,VITAMIN B-12,) 1000 MCG/ML injection Inject 1 mL into the muscle every 28 (twenty-eight) days. 03/15/20   [provider]  diltiazem (CARDIZEM CD) 120 MG 24 hr capsule Take 1 capsule (120 mg total) by mouth daily. 10/09/19   Kathlen Mody, MD  ferrous sulfate 325 (65  FE) MG tablet Take 325 mg by mouth 2 (two) times daily with a meal. 03/15/20   [provider]  furosemide (LASIX) 20 MG tablet Take 20 mg by mouth daily. May take one additional tablet as needed for leg swelling.    [provider]  gabapentin (NEURONTIN) 800 MG tablet Take 800 mg by mouth 3 (three) times daily. 03/15/20   [provider]  methocarbamol (ROBAXIN) 500 MG tablet Take 1 tablet (500 mg total) by mouth 2 (two) times daily as needed for muscle spasms. 05/14/23   Jacalyn Lefevre, MD  metoprolol succinate (TOPROL-XL) 25 MG 24 hr tablet Take 25 mg by mouth daily.    [provider]  oxyCODONE-acetaminophen (PERCOCET) 10-325 MG tablet Take 1 tablet by mouth 5 (five) times daily as needed for pain.    [provider]  polyethylene glycol (MIRALAX / GLYCOLAX) packet Take 17 g by mouth daily as needed for mild constipation.    [provider]  potassium chloride (K-DUR,KLOR-CON) 20 MEQ tablet Take 1 tablet (20 mEq total) by mouth daily. Patient taking differently: Take 20 mEq by mouth at bedtime. 09/20/18   Strader, Lennart Pall, PA-C  rOPINIRole (REQUIP) 0.25 MG tablet Take 0.25 mg by mouth 2 (two) times daily.  10/09/18   [provider]  simvastatin (ZOCOR) 40 MG tablet Take 40 mg by mouth at bedtime.    [provider]  spironolactone (ALDACTONE) 25 MG tablet Take 25 mg by mouth daily.    [provider]  sucralfate (CARAFATE) 1 g tablet Take 1 g by mouth 2 (two) times daily. 06/03/23   [provider]  tamsulosin (FLOMAX) 0.4 MG CAPS capsule Take 0.4 mg by mouth daily. 09/02/19   [provider]  topiramate (TOPAMAX) 25 MG tablet Take 25 mg by mouth 2 (two) times daily.    [provider]    Physical Exam: Vitals:   11/30/23 1330 11/30/23 1515 11/30/23 1718  BP: 111/70 127/82 114/70  Pulse: 98 100 91  Resp:  20 (!) 24  Temp: 97.7 F (36.5 C)  97.7 F (36.5 C)  TempSrc:   Oral  SpO2: 100% 99% 100%    Constitutional: Patient sleeping but easily arousable.  Complains of pain along the right jaw. Eyes: PERRL, lids and conjunctivae normal ENMT: Facial ecchymosis along forehead, right-sided ecchymotic swelling-possibly hematoma in the maxillary/mandibular area, somewhat tender to touch  neck: normal, supple, no masses, no thyromegaly Respiratory: clear to auscultation bilaterally, no wheezing, no crackles. Normal respiratory effort. No accessory muscle use.  Cardiovascular: Irregular rate and rhythm, 2+ pitting bilateral lower extremity edema. 2+ pedal pulses. No carotid bruits.  Abdomen: no tenderness, no masses palpated. No hepatosplenomegaly. Bowel sounds positive.  Musculoskeletal: no clubbing / cyanosis. No joint deformity upper and lower extremities. Good ROM, no contractures. Normal muscle tone.  Neurologic: CN 2-12 grossly intact. Sensation intact, DTR normal. Strength 5/5 in all 4.  Psychiatric: Somnolent currently, oriented x 2, smiles when asked about rib pain and says "no" SKIN/catheters: Small abrasions noted along the dorsal aspect of right hand.  Left lower extremity erythema, warmth and tenderness.  Facial and upper extremity ecchymosis      Labs on Admission: I have personally reviewed following labs and imaging studies  CBC: Recent Labs  Lab 11/30/23 1418 11/30/23 1442  WBC 11.2*  --   HGB 14.2 15.6  HCT 44.3 46.0  MCV  99.6  --   PLT 253  --  Basic Metabolic Panel: Recent Labs  Lab 11/30/23 1418 11/30/23 1442  NA 134* 136  K 4.3 4.5  CL 100 103  CO2 23  --   GLUCOSE 93 90  BUN 23 26*  CREATININE 1.04 0.90  CALCIUM 9.1  --    GFR: CrCl cannot be calculated (Unknown ideal weight.). Recent Labs  Lab 11/30/23 1418 11/30/23 1441  WBC 11.2*  --   LATICACIDVEN  --  1.9   Liver Function Tests: Recent Labs  Lab 11/30/23 1606  AST 58*  ALT 25  ALKPHOS 78  BILITOT 1.4*  PROT 6.8  ALBUMIN 3.0*   Recent Labs  Lab 11/30/23 1606  LIPASE 21   Recent Labs  Lab 11/30/23 1606  AMMONIA 18   Coagulation Profile: Recent Labs  Lab 11/30/23 1418  INR 1.0   Cardiac Enzymes: Recent Labs  Lab 11/30/23 1606  CKTOTAL 1,065*   BNP (last 3 results) No results for input(s): "PROBNP" in the last 8760 hours. HbA1C: No results for input(s): "HGBA1C" in the last 72 hours. CBG: No results for input(s): "GLUCAP" in the last 168 hours. Lipid Profile: No results for input(s): "CHOL", "HDL", "LDLCALC", "TRIG", "CHOLHDL", "LDLDIRECT" in the last 72 hours. Thyroid Function Tests: No results for input(s): "TSH", "T4TOTAL", "FREET4", "T3FREE", "THYROIDAB" in the last 72 hours. Anemia Panel: No results for input(s): "VITAMINB12", "FOLATE", "FERRITIN", "TIBC", "IRON", "RETICCTPCT" in the last 72 hours. Urine analysis:    Component Value Date/Time   COLORURINE YELLOW (A) 03/18/2023 2153   APPEARANCEUR CLEAR (A) 03/18/2023 2153   LABSPEC 1.021 03/18/2023 2153   PHURINE 6.0 03/18/2023 2153   GLUCOSEU NEGATIVE 03/18/2023 2153   HGBUR SMALL (A) 03/18/2023 2153   HGBUR negative 04/08/2007 1327   BILIRUBINUR NEGATIVE 03/18/2023 2153   KETONESUR 5 (A) 03/18/2023 2153   PROTEINUR 100 (A) 03/18/2023 2153   UROBILINOGEN 0.2 04/24/2010 1617   NITRITE NEGATIVE 03/18/2023 2153   LEUKOCYTESUR NEGATIVE 03/18/2023 2153    Radiological Exams on Admission: Personally reviewed  CT CHEST ABDOMEN PELVIS W  CONTRAST Result Date: 11/30/2023 CLINICAL DATA:  Ground level fall on blood thinners altered EXAM: CT CHEST, ABDOMEN, AND PELVIS WITH CONTRAST TECHNIQUE: Multidetector CT imaging of the chest, abdomen and pelvis was performed following the standard protocol during bolus administration of intravenous contrast. RADIATION DOSE REDUCTION: This exam was performed according to the departmental dose-optimization program which includes automated exposure control, adjustment of the mA and/or kV according to patient size and/or use of iterative reconstruction technique. CONTRAST:  75mL OMNIPAQUE IOHEXOL 350 MG/ML SOLN COMPARISON:  Radiographs 11/30/2023, chest CT 10/26/2023, CT chest abdomen pelvis 05/14/2023 FINDINGS: CT CHEST FINDINGS Cardiovascular: Nonaneurysmal aorta. Mild atherosclerosis. No dissection. Normal contour. Aortic valve prosthesis. No pericardial effusion Mediastinum/Nodes: Patent trachea. No thyroid mass. No suspicious lymph nodes. Esophagus within normal limits. Lungs/Pleura: Clearing of previously noted left lower lobe pneumonia. Interim development of patchy ground-glass disease in the right upper lobe. Tiny air within the pleural space at the left apex. Small pneumothorax at the left anterior lung base. Musculoskeletal: Advanced multilevel degenerative changes. Posterior spinal hardware T10 and T11 with osseous fusion. Acute displaced left ninth posterolateral rib fracture. Multiple old appearing bilateral rib fractures. Gynecomastia. CT ABDOMEN PELVIS FINDINGS Hepatobiliary: No focal liver abnormality is seen. No gallstones, gallbladder wall thickening, or biliary dilatation. Pancreas: Unremarkable. No pancreatic ductal dilatation or surrounding inflammatory changes. Spleen: Normal in size without focal abnormality. Adrenals/Urinary Tract: Adrenal glands are normal. Small nonobstructing right kidney stone. Bilateral renal cysts for which no  imaging follow-up is recommended. No hydronephrosis.  Multiple small bladder diverticula. Bilateral renal cortical atrophy. Stomach/Bowel: Postsurgical changes of the stomach. No dilated small bowel. No acute bowel wall thickening. Negative appendix Vascular/Lymphatic: Moderate aortic atherosclerosis. No aneurysm. No suspicious lymph nodes. Reproductive: Negative prostate Other: Negative for free air or free fluid. Small fat containing inguinal hernias.Moderate fat containing supraumbilical ventral hernia. Small fat containing umbilical hernia. Musculoskeletal: Multilevel lumbar degenerative change. No acute osseous abnormality. IMPRESSION: 1. Acute displaced left ninth posterolateral rib fracture. Trace left pneumothorax. 2. Interim development of patchy ground-glass disease in the right upper lobe, suspicious for pneumonia. Clearing of previously noted left lower lobe airspace disease. 3. No CT evidence for acute intra-abdominal or pelvic abnormality. 4. Small nonobstructing right kidney stone. Multiple small bladder diverticula. 5. Aortic atherosclerosis. Aortic Atherosclerosis (ICD10-I70.0). Electronically Signed   By: Jasmine Pang M.D.   On: 11/30/2023 16:42   CT Head Wo Contrast Result Date: 11/30/2023 CLINICAL DATA:  Head trauma, moderate-severe; Neck trauma (Age >= 65y) EXAM: CT HEAD WITHOUT CONTRAST CT CERVICAL SPINE WITHOUT CONTRAST TECHNIQUE: Multidetector CT imaging of the head and cervical spine was performed following the standard protocol without intravenous contrast. Multiplanar CT image reconstructions of the cervical spine were also generated. RADIATION DOSE REDUCTION: This exam was performed according to the departmental dose-optimization program which includes automated exposure control, adjustment of the mA and/or kV according to patient size and/or use of iterative reconstruction technique. COMPARISON:  None Available. FINDINGS: CT HEAD FINDINGS Brain: No evidence of acute infarction, hemorrhage, hydrocephalus, extra-axial collection or mass  lesion/mass effect. Vascular: Calcific atherosclerosis. Skull: No acute fracture. Sinuses/Orbits: No acute abnormality. Other: No mastoid effusions. CT CERVICAL SPINE FINDINGS Alignment: Similar alignment in comparison to the prior. No new sagittal subluxation. Skull base and vertebrae: Motion limited assessment without evidence of acute fracture. Extensive ankylosis throughout the cervical spine with skull base to T1 posterior fusion at C6-C7 ACDF. Osteopenia. Soft tissues and spinal canal: No prevertebral fluid or swelling. No visible canal hematoma. Disc levels: Severe multilevel bony degenerative change, not substantially changed. Upper chest: Further evaluated on same day CT chest/abdomen/pelvis. IMPRESSION: No evidence of acute abnormality intracranially or in the cervical spine. Electronically Signed   By: Feliberto Harts M.D.   On: 11/30/2023 15:32   CT Cervical Spine Wo Contrast Result Date: 11/30/2023 CLINICAL DATA:  Head trauma, moderate-severe; Neck trauma (Age >= 65y) EXAM: CT HEAD WITHOUT CONTRAST CT CERVICAL SPINE WITHOUT CONTRAST TECHNIQUE: Multidetector CT imaging of the head and cervical spine was performed following the standard protocol without intravenous contrast. Multiplanar CT image reconstructions of the cervical spine were also generated. RADIATION DOSE REDUCTION: This exam was performed according to the departmental dose-optimization program which includes automated exposure control, adjustment of the mA and/or kV according to patient size and/or use of iterative reconstruction technique. COMPARISON:  None Available. FINDINGS: CT HEAD FINDINGS Brain: No evidence of acute infarction, hemorrhage, hydrocephalus, extra-axial collection or mass lesion/mass effect. Vascular: Calcific atherosclerosis. Skull: No acute fracture. Sinuses/Orbits: No acute abnormality. Other: No mastoid effusions. CT CERVICAL SPINE FINDINGS Alignment: Similar alignment in comparison to the prior. No new sagittal  subluxation. Skull base and vertebrae: Motion limited assessment without evidence of acute fracture. Extensive ankylosis throughout the cervical spine with skull base to T1 posterior fusion at C6-C7 ACDF. Osteopenia. Soft tissues and spinal canal: No prevertebral fluid or swelling. No visible canal hematoma. Disc levels: Severe multilevel bony degenerative change, not substantially changed. Upper chest: Further evaluated on same day CT chest/abdomen/pelvis.  IMPRESSION: No evidence of acute abnormality intracranially or in the cervical spine. Electronically Signed   By: Feliberto Harts M.D.   On: 11/30/2023 15:32   DG Pelvis Portable Result Date: 11/30/2023 CLINICAL DATA:  Fall EXAM: PORTABLE PELVIS 1-2 VIEWS COMPARISON:  08/09/2023 FINDINGS: There is no evidence of pelvic fracture or diastasis. Mild degenerative changes of both hips without evidence of fracture or dislocation. No pelvic bone lesions are seen. IMPRESSION: Negative. Electronically Signed   By: Duanne Guess D.O.   On: 11/30/2023 14:59   DG Chest Portable 1 View Result Date: 11/30/2023 CLINICAL DATA:  Chest pain, fall EXAM: PORTABLE CHEST 1 VIEW COMPARISON:  10/26/2023 FINDINGS: Stable cardiomegaly. Aortic atherosclerosis. Subtle airspace opacity in the right upper lobe. No pleural effusion or pneumothorax. IMPRESSION: Subtle airspace opacity in the right upper lobe, which may represent atelectasis or pneumonia. Electronically Signed   By: Duanne Guess D.O.   On: 11/30/2023 14:58    EKG: Independently reviewed.  Atrial fibrillation, QTc 495 ms     Assessment and Plan:     1.Recurrent falls: Appear to be mostly mechanical.  Patient currently oriented x 2 which is close to his baseline based on discussion with sister.  Trauma workup results as above.  See plan in problem #2.  CT face has been ordered by trauma-follow-up results.  Holding anticoagulation for now.  2.  Rib fractures, small left pneumothorax: Patient denies  any rib pain.  Not hypoxic.  Seen by trauma and recommended serial chest x-rays as outlined in their note.  3.  Paroxysmal atrial fibrillation: Currently rate controlled.  Holding Eliquis in concern for facial hematoma/ecchymosis and as advised by trauma.  Patient high risk for life-threatening bleeding given recurrent falls however he also has high CHA2DS2-VASc score of 7.  Consider cardiology evaluation or discussion with family regarding risks and benefits.  4. CK elevation: Likely due to fall/being on the floor.  CK elevated modestly at 1065.  Patient currently on IV fluids at 150 mL/h.  Concern for volume overload as patient already has leg swellings.  Will hold IV fluids for now, repeat CK level in a.m. if still up-trending-can resume fluids at a lower load along with IV diuretics.  5.  Right upper lobe infiltrate: Could be related to aspiration, patient does have mild leukocytosis-ED initiated on Rocephin, Zithromax.  Will resume antibiotics upon admission.  6.  Left lower extremity cellulitis: Patient noted to have bilateral lower extremity swelling along with erythematous left lower extremity as shown above.  On Rocephin for problem #5  7.  Chronic diastolic CHF, bilateral lower extremity swellings: Chronic issue although somewhat worsened according to sister.  Resume home diuretics.  Avoid aggressive IV hydration with risk of volume overload.  8.  Hypertension: Resume home medications.  Hydralazine as needed  9.  H/o CVA: With memory deficits and ataxia mainly as residual effects.  Resume aspirin.  Holding anticoagulation for now  10. Chronic back pain: Patient has had multiple back surgeries and on multiple pain medication/muscle relaxants/neuropathic agents at baseline.  Wonder if contributing to falls.  Resumed home medications for now.  Delirium precautions and PT evaluation.    DVT prophylaxis: Lovenox given high risk of DVT with leg swellings, immobility.  Holding Eliquis in  concern for facial ecchymosis/hematoma.  Monitor hemoglobin  Code Status: DNI as requested by patient and sister at bedside   .Health care proxy would be Sister Steward Drone  Patient/Family Communication: Discussed with patient and all questions answered to satisfaction.  Consults called: Trauma Admission status :I certify that at the point of admission it is my clinical judgment that the patient will require inpatient hospital care spanning beyond 2 midnights from the point of admission due to high intensity of service and high frequency of surveillance required.Inpatient status is judged to be reasonable and necessary in order to provide the required intensity of service to ensure the patient's safety. The patient's presenting symptoms, physical exam findings, and initial radiographic and laboratory data in the context of their chronic comorbidities is felt to place them at high risk for further clinical deterioration. The following factors support the patient status of inpatient : Pneumonia, pneumothorax, rhabdomyolysis, cellulitis requiring multiple IV treatments.     Alessandra Bevels MD [facility] Pager in [place]  If 7PM-7AM, please contact night-coverage [email or web address]   11/30/2023, 7:24 PM

## 2023-11-30 NOTE — Consult Note (Signed)
Reason for Consult/Chief Complaint: Ground level fall Referring provider: Jasmine Pang, PA  HPI  [name] is an 82 y.o. male with history of HTN, iron deficiency, stroke, Afib on Eliquis who presents to ED today after a witness fall 2 days ago. He fell backward hitting his head on the chair, breaking the chair in half. Since the fall he has had AMS. On arrival, EMS reported finding him on the ground. Patient's sister is with him at bedside and states at baseline he is sometimes confused. Patient lives with her at her home. Uses a walker for ambulation at baseline.   10 point review of systems is negative except as listed above in HPI.  Objective  Past Medical History: Past Medical History:  Diagnosis Date   Cancer (HCC)    Carpal tunnel syndrome    Cervical spondylosis with myelopathy    Chronic pain syndrome    Depression    Difficult intubation    needs smaller tube; awake oral fiberoptic scope 8.0 ETT 03/30/08   Dysphagia, pharyngeal phase    Hemorrhage of gastrointestinal tract, unspecified    History of cardiac cath    a. 03/2018 Cath: LAD and LCX with mild luminal irregularities.  No significant disease.   Iron deficiency anemia, unspecified    Lumbosacral spondylosis without myelopathy    Muscle weakness (generalized)    Other and unspecified disc disorder of cervical region    Other and unspecified hyperlipidemia    Patent foramen ovale    a. 05/2018 Echo: post-op TAVR--> + bublble study w/ L->R shunt.   Permanent atrial fibrillation (HCC)    a.  Diagnosed in the spring 2019.  Had rapid atrial fibrillation following TAVR and has been rate controlled with beta-blocker.  CHA2DS2VASc equals 7.  Supposed to be on Eliquis.   Primary localized osteoarthrosis, lower leg    Primary localized osteoarthrosis, shoulder region    Reflux esophagitis    Severe aortic stenosis    a. mild-mod AS by 01/2016 TTE, restricted AV opening with mod AR 01/2016 TEE; b. 10/2017 Echo: EF  60-65%, sev Ca2+ AoV, mean grad ; c. 05/2018 s/p TAVR ([facility]); d. 06/2018 Echo: EF 55-60%, well-seated prosth AoV, peak velocity 156cm/s. AoV gradient .   Spinal stenosis, unspecified region other than cervical    Stroke (HCC)    01/2016   Syncope and collapse    Unspecified arthropathy, lower leg    Unspecified constipation    Unspecified essential hypertension    Unspecified glaucoma(365.9)     Past Surgical History: Past Surgical History:  Procedure Laterality Date   BILATERAL CATARACT SURGERY  2009   DR GROAT    BIOPSY  10/14/2018   Procedure: BIOPSY;  Surgeon: Graylin Shiver, MD;  Location: [facility] ENDOSCOPY;  Service: Endoscopy;;   CARDIAC VALVE REPLACEMENT  05/2018   CERVIACAL SPINE (4X)     DR MARK ROY    COLONOSCOPY  2007   DR HENSEL    COLONOSCOPY WITH PROPOFOL N/A 11/20/2017   Procedure: COLONOSCOPY WITH PROPOFOL;  Surgeon: Charlott Rakes, MD;  Location: [facility] ENDOSCOPY;  Service: Endoscopy;  Laterality: N/A;   ESOPHAGOGASTRODUODENOSCOPY (EGD) WITH PROPOFOL N/A 10/14/2018   Procedure: ESOPHAGOGASTRODUODENOSCOPY (EGD) WITH PROPOFOL;  Surgeon: Graylin Shiver, MD;  Location: [facility] ENDOSCOPY;  Service: Endoscopy;  Laterality: N/A;   JOINT REPLACEMENT     both knees   LEFT KNEE REPLACEMENT     DR ALUSIO   LEFT TRANSVERSE CARPAL LIGAMENT  01/06/2008  ROTATOR CUFF LEFT SHOULDER  2001   DR MURPHY    SHOULDER OPEN ROTATOR CUFF REPAIR  2006   DR [facility]   SPINE SURGERY     neck fusion    Family History:  Family History  Problem Relation Age of Onset   Cancer Mother 37       cervical cancer   Cancer Father 73       lung  cancer    Social History:  reports that he quit smoking about 38 years ago. His smoking use included cigarettes. He quit smokeless tobacco use about 38 years ago. He reports that he does not drink alcohol and does not use drugs.  Allergies:  Allergies  Allergen Reactions   Lorazepam Other (See Comments)    Delirium   Duloxetine Other (See  Comments)    Increased depression, irritability   Pregabalin Swelling and Other (See Comments)    Flashbacks, nightmares, aggression   Zanaflex [Tizanidine Hcl] Swelling and Anxiety    Medications: I have reviewed the patient's current medications.    Labs: I have personally reviewed all labs for the past 24h Results for orders placed or performed during the hospital encounter of 11/30/23 (from the past 48 hours)  CBC     Status: Abnormal   Collection Time: 11/30/23  2:18 PM  Result Value Ref Range   WBC 11.2 (H) 4.0 - 10.5 K/uL   RBC 4.45 4.22 - 5.81 MIL/uL   Hemoglobin 14.2 13.0 - 17.0 g/dL   HCT 16.1 09.6 - 04.5 %   MCV 99.6 80.0 - 100.0 fL   MCH 31.9 26.0 - 34.0 pg   MCHC 32.1 30.0 - 36.0 g/dL   RDW 40.9 81.1 - 91.4 %   Platelets 253 150 - 400 K/uL   nRBC 0.0 0.0 - 0.2 %    Comment: Performed at [facility], [address], [place], [place] [address]  Basic metabolic panel     Status: Abnormal   Collection Time: 11/30/23  2:18 PM  Result Value Ref Range   Sodium 134 (L) 135 - 145 mmol/L   Potassium 4.3 3.5 - 5.1 mmol/L   Chloride 100 98 - 111 mmol/L   CO2 23 22 - 32 mmol/L   Glucose, Bld 93 70 - 99 mg/dL    Comment: Glucose reference range applies only to samples taken after fasting for at least 8 hours.   BUN 23 8 - 23 mg/dL   Creatinine, Ser 6.21 0.61 - 1.24 mg/dL   Calcium 9.1 8.9 - 30.8 mg/dL   GFR, Estimated >65 >78 mL/min    Comment: (NOTE) Calculated using the CKD-EPI Creatinine Equation (2021)    Anion gap 11 5 - 15    Comment: Performed at [facility], [address] [place], [place] [address]  Ethanol     Status: None   Collection Time: 11/30/23  2:18 PM  Result Value Ref Range   Alcohol, Ethyl (B) <10 <10 mg/dL    Comment: (NOTE) Lowest detectable limit for serum alcohol is 10 mg/dL.  For medical purposes only. Performed at [facility], [address], [place], [place] [address]   Protime-INR     Status: None   Collection  Time: 11/30/23  2:18 PM  Result Value Ref Range   Prothrombin Time 13.1 11.4 - 15.2 seconds   INR 1.0 0.8 - 1.2    Comment: (NOTE) INR goal varies based on device and disease states. Performed at  [facility], [address], [place], [place] [address]   Sample to Blood Bank     Status: None   Collection Time: 11/30/23  2:18 PM  Result Value Ref Range   Blood Bank Specimen SAMPLE AVAILABLE FOR TESTING    Sample Expiration      12/03/2023,2359 Performed at [facility], [address], [place], [place] [address]   I-Stat Lactic Acid, ED     Status: None   Collection Time: 11/30/23  2:41 PM  Result Value Ref Range   Lactic Acid, Venous 1.9 0.5 - 1.9 mmol/L  I-Stat Chem 8, ED     Status: Abnormal   Collection Time: 11/30/23  2:42 PM  Result Value Ref Range   Sodium 136 135 - 145 mmol/L   Potassium 4.5 3.5 - 5.1 mmol/L   Chloride 103 98 - 111 mmol/L   BUN 26 (H) 8 - 23 mg/dL   Creatinine, Ser 8.46 0.61 - 1.24 mg/dL   Glucose, Bld 90 70 - 99 mg/dL    Comment: Glucose reference range applies only to samples taken after fasting for at least 8 hours.   Calcium, Ion 1.03 (L) 1.15 - 1.40 mmol/L   TCO2 23 22 - 32 mmol/L   Hemoglobin 15.6 13.0 - 17.0 g/dL   HCT 96.2 95.2 - 84.1 %  Lipase, blood     Status: None   Collection Time: 11/30/23  4:06 PM  Result Value Ref Range   Lipase 21 11 - 51 U/L    Comment: Performed at [facility], [address] [place], [place] [address]  Ammonia     Status: None   Collection Time: 11/30/23  4:06 PM  Result Value Ref Range   Ammonia 18 9 - 35 umol/L    Comment: Performed at [facility], [address], [place], [place] [address]  Hepatic function panel     Status: Abnormal   Collection Time: 11/30/23  4:06 PM  Result Value Ref Range   Total Protein 6.8 6.5 - 8.1 g/dL   Albumin 3.0 (L) 3.5 - 5.0 g/dL   AST 58 (H) 15 - 41 U/L   ALT 25 0 - 44 U/L   Alkaline Phosphatase 78 38 - 126 U/L   Total Bilirubin 1.4 (H) <1.2  mg/dL   Bilirubin, Direct 0.3 (H) 0.0 - 0.2 mg/dL   Indirect Bilirubin 1.1 (H) 0.3 - 0.9 mg/dL    Comment: Performed at [facility], [address] [place], [place] [address]  CK     Status: Abnormal   Collection Time: 11/30/23  4:06 PM  Result Value Ref Range   Total CK 1,065 (H) 49 - 397 U/L    Comment: Performed at [facility], [address] [place], [place] [address]  Troponin I (High Sensitivity)     Status: None   Collection Time: 11/30/23  4:06 PM  Result Value Ref Range   Troponin I (High Sensitivity) 12 <18 ng/L    Comment: (NOTE) Elevated high sensitivity troponin I (hsTnI) values and significant  changes across serial measurements may suggest ACS but many other  chronic and acute conditions are known to elevate hsTnI results.  Refer to the "Links" section for chest pain algorithms and additional  guidance. Performed at [facility], [address] [place], [place] [address]     Imaging: I have personally reviewed and interpreted all imaging for the past 24h and  agree with the radiologist's impression.  CT CHEST ABDOMEN PELVIS W CONTRAST Result Date: 11/30/2023 CLINICAL DATA:  Ground level fall on blood thinners altered EXAM: CT CHEST, ABDOMEN, AND PELVIS WITH CONTRAST TECHNIQUE: Multidetector CT imaging of the chest, abdomen and pelvis was performed following the standard protocol during bolus administration of intravenous contrast. RADIATION DOSE REDUCTION: This exam was performed according to the departmental dose-optimization program which includes automated exposure control, adjustment of the mA and/or kV according to patient size and/or use of iterative reconstruction technique. CONTRAST:  75mL OMNIPAQUE IOHEXOL 350 MG/ML SOLN COMPARISON:  Radiographs 11/30/2023, chest CT 10/26/2023, CT chest abdomen pelvis 05/14/2023 FINDINGS: CT CHEST FINDINGS Cardiovascular: Nonaneurysmal aorta. Mild atherosclerosis. No dissection. Normal contour. Aortic valve  prosthesis. No pericardial effusion Mediastinum/Nodes: Patent trachea. No thyroid mass. No suspicious lymph nodes. Esophagus within normal limits. Lungs/Pleura: Clearing of previously noted left lower lobe pneumonia. Interim development of patchy ground-glass disease in the right upper lobe. Tiny air within the pleural space at the left apex. Small pneumothorax at the left anterior lung base. Musculoskeletal: Advanced multilevel degenerative changes. Posterior spinal hardware T10 and T11 with osseous fusion. Acute displaced left ninth posterolateral rib fracture. Multiple old appearing bilateral rib fractures. Gynecomastia. CT ABDOMEN PELVIS FINDINGS Hepatobiliary: No focal liver abnormality is seen. No gallstones, gallbladder wall thickening, or biliary dilatation. Pancreas: Unremarkable. No pancreatic ductal dilatation or surrounding inflammatory changes. Spleen: Normal in size without focal abnormality. Adrenals/Urinary Tract: Adrenal glands are normal. Small nonobstructing right kidney stone. Bilateral renal cysts for which no imaging follow-up is recommended. No hydronephrosis. Multiple small bladder diverticula. Bilateral renal cortical atrophy. Stomach/Bowel: Postsurgical changes of the stomach. No dilated small bowel. No acute bowel wall thickening. Negative appendix Vascular/Lymphatic: Moderate aortic atherosclerosis. No aneurysm. No suspicious lymph nodes. Reproductive: Negative prostate Other: Negative for free air or free fluid. Small fat containing inguinal hernias.Moderate fat containing supraumbilical ventral hernia. Small fat containing umbilical hernia. Musculoskeletal: Multilevel lumbar degenerative change. No acute osseous abnormality. IMPRESSION: 1. Acute displaced left ninth posterolateral rib fracture. Trace left pneumothorax. 2. Interim development of patchy ground-glass disease in the right upper lobe, suspicious for pneumonia. Clearing of previously noted left lower lobe airspace disease. 3.  No CT evidence for acute intra-abdominal or pelvic abnormality. 4. Small nonobstructing right kidney stone. Multiple small bladder diverticula. 5. Aortic atherosclerosis. Aortic Atherosclerosis (ICD10-I70.0). Electronically Signed   By: Jasmine Pang M.D.   On: 11/30/2023 16:42   CT Head Wo Contrast Result Date: 11/30/2023 CLINICAL DATA:  Head trauma, moderate-severe; Neck trauma (Age >= 65y) EXAM: CT HEAD WITHOUT CONTRAST CT CERVICAL SPINE WITHOUT CONTRAST TECHNIQUE: Multidetector CT imaging of the head and cervical spine was performed following the standard protocol without intravenous contrast. Multiplanar CT image reconstructions of the cervical spine were also generated. RADIATION DOSE REDUCTION: This exam was performed according to the departmental dose-optimization program which includes automated exposure control, adjustment of the mA and/or kV according to patient size and/or use of iterative reconstruction technique. COMPARISON:  None Available. FINDINGS: CT HEAD FINDINGS Brain: No evidence of acute infarction, hemorrhage, hydrocephalus, extra-axial collection or mass lesion/mass effect. Vascular: Calcific atherosclerosis. Skull: No acute fracture. Sinuses/Orbits: No acute abnormality. Other: No mastoid effusions. CT CERVICAL SPINE FINDINGS Alignment: Similar alignment in comparison to the prior. No new sagittal subluxation. Skull base and vertebrae: Motion limited assessment without evidence of acute fracture. Extensive ankylosis throughout the cervical spine with skull base to T1 posterior fusion at C6-C7 ACDF. Osteopenia. Soft tissues and spinal canal: No prevertebral fluid or  swelling. No visible canal hematoma. Disc levels: Severe multilevel bony degenerative change, not substantially changed. Upper chest: Further evaluated on same day CT chest/abdomen/pelvis. IMPRESSION: No evidence of acute abnormality intracranially or in the cervical spine. Electronically Signed   By: Feliberto Harts M.D.    On: 11/30/2023 15:32   CT Cervical Spine Wo Contrast Result Date: 11/30/2023 CLINICAL DATA:  Head trauma, moderate-severe; Neck trauma (Age >= 65y) EXAM: CT HEAD WITHOUT CONTRAST CT CERVICAL SPINE WITHOUT CONTRAST TECHNIQUE: Multidetector CT imaging of the head and cervical spine was performed following the standard protocol without intravenous contrast. Multiplanar CT image reconstructions of the cervical spine were also generated. RADIATION DOSE REDUCTION: This exam was performed according to the departmental dose-optimization program which includes automated exposure control, adjustment of the mA and/or kV according to patient size and/or use of iterative reconstruction technique. COMPARISON:  None Available. FINDINGS: CT HEAD FINDINGS Brain: No evidence of acute infarction, hemorrhage, hydrocephalus, extra-axial collection or mass lesion/mass effect. Vascular: Calcific atherosclerosis. Skull: No acute fracture. Sinuses/Orbits: No acute abnormality. Other: No mastoid effusions. CT CERVICAL SPINE FINDINGS Alignment: Similar alignment in comparison to the prior. No new sagittal subluxation. Skull base and vertebrae: Motion limited assessment without evidence of acute fracture. Extensive ankylosis throughout the cervical spine with skull base to T1 posterior fusion at C6-C7 ACDF. Osteopenia. Soft tissues and spinal canal: No prevertebral fluid or swelling. No visible canal hematoma. Disc levels: Severe multilevel bony degenerative change, not substantially changed. Upper chest: Further evaluated on same day CT chest/abdomen/pelvis. IMPRESSION: No evidence of acute abnormality intracranially or in the cervical spine. Electronically Signed   By: Feliberto Harts M.D.   On: 11/30/2023 15:32   DG Pelvis Portable Result Date: 11/30/2023 CLINICAL DATA:  Fall EXAM: PORTABLE PELVIS 1-2 VIEWS COMPARISON:  08/09/2023 FINDINGS: There is no evidence of pelvic fracture or diastasis. Mild degenerative changes of both  hips without evidence of fracture or dislocation. No pelvic bone lesions are seen. IMPRESSION: Negative. Electronically Signed   By: Duanne Guess D.O.   On: 11/30/2023 14:59   DG Chest Portable 1 View Result Date: 11/30/2023 CLINICAL DATA:  Chest pain, fall EXAM: PORTABLE CHEST 1 VIEW COMPARISON:  10/26/2023 FINDINGS: Stable cardiomegaly. Aortic atherosclerosis. Subtle airspace opacity in the right upper lobe. No pleural effusion or pneumothorax. IMPRESSION: Subtle airspace opacity in the right upper lobe, which may represent atelectasis or pneumonia. Electronically Signed   By: Duanne Guess D.O.   On: 11/30/2023 14:58      Physical Exam Blood pressure 114/70, pulse 91, temperature 97.7 F (36.5 C), temperature source Oral, resp. rate (!) 24, SpO2 100%. Constitutional: Elderly male, no acute distress HEENT: pupils equal, round, reactive to light, moist conjunctiva, external inspection of ears and nose normal, hearing intact. Ecchymosis to anterior neck, right cheek and right mandible. Right periorbital ecchymosis Associated swelling. Tender to palpation Oropharynx: normal oropharyngeal mucosa, normal dentition Neck: no thyromegaly, trachea midline, no midline cervical tenderness to palpation CV: Irregularly irregular, rate controlled Chest: breath sounds equal bilaterally, normal respiratory effort,+ tenderness to left chest wall Abdomen: soft, NT, no bruising, no hepatosplenomegaly GU: DEFERRED Back: no wounds, no thoracic/lumbar spine tenderness to palpation, no thoracic/lumbar spine stepoffs Rectal: deferred Extremities: Warm, some bilateral edema. Chronic wounds to anterior shins MSK: Moves all 4 extremities Skin: warm, dry, no rashes Psych: Pleasant but confused.    Assessment: [name] is an 82 y.o. male who presents to [facility] ED s/p fall 2 days ago.  Known Injuries: - Left  9th displaced rib fracture - Trace left pneumothorax at left anterior lung base  Plan: -  Repeat CXR ordered for this evening at 2100, and for tomorrow AM. If both are stable then can stop CXR - CT Max-Face ordered given bruising, tenderness to right cheek and mandible. - FEN - Okay for FLD, if CT Max-Face negative then can advance diet - DVT - SCDs, LMWH ppx, would hold Eliquis at this time. Need to weigh risk/benefit of AC given his age, history of falls. - Dispo - med-surg   I spent a total of 62 minutes in both face-to-face and non-face-to-face activities, excluding procedures performed, for this visit on the date of this encounter.

## 2023-11-30 NOTE — ED Notes (Signed)
Attempted to call 5[facility] nurse 2 times for report, unsuccesful in reaching them both times. Will put in Ed hand off note

## 2023-12-01 ENCOUNTER — Inpatient Hospital Stay (HOSPITAL_COMMUNITY): Payer: Medicare Other

## 2023-12-01 DIAGNOSIS — W19XXXA Unspecified fall, initial encounter: Secondary | ICD-10-CM | POA: Diagnosis not present

## 2023-12-01 DIAGNOSIS — S2232XA Fracture of one rib, left side, initial encounter for closed fracture: Secondary | ICD-10-CM | POA: Diagnosis not present

## 2023-12-01 LAB — BASIC METABOLIC PANEL
Anion gap: 7 (ref 5–15)
BUN: 20 mg/dL (ref 8–23)
CO2: 26 mmol/L (ref 22–32)
Calcium: 8.7 mg/dL — ABNORMAL LOW (ref 8.9–10.3)
Chloride: 104 mmol/L (ref 98–111)
Creatinine, Ser: 1.36 mg/dL — ABNORMAL HIGH (ref 0.61–1.24)
GFR, Estimated: 52 mL/min — ABNORMAL LOW (ref >60)
Glucose, Bld: 77 mg/dL (ref 70–99)
Potassium: 3.9 mmol/L (ref 3.5–5.1)
Sodium: 137 mmol/L (ref 135–145)

## 2023-12-01 LAB — CBC
HCT: 37.9 % — ABNORMAL LOW (ref 39.0–52.0)
Hemoglobin: 12.3 g/dL — ABNORMAL LOW (ref 13.0–17.0)
MCH: 31.7 pg (ref 26.0–34.0)
MCHC: 32.5 g/dL (ref 30.0–36.0)
MCV: 97.7 fL (ref 80.0–100.0)
Platelets: 231 10*3/uL (ref 150–400)
RBC: 3.88 MIL/uL — ABNORMAL LOW (ref 4.22–5.81)
RDW: 14.7 % (ref 11.5–15.5)
WBC: 7.4 10*3/uL (ref 4.0–10.5)
nRBC: 0 % (ref 0.0–0.2)

## 2023-12-01 LAB — CK: Total CK: 565 U/L — ABNORMAL HIGH (ref 49–397)

## 2023-12-01 LAB — CREATININE, SERUM
Creatinine, Ser: 1.43 mg/dL — ABNORMAL HIGH (ref 0.61–1.24)
GFR, Estimated: 49 mL/min — ABNORMAL LOW (ref >60)

## 2023-12-01 MED ORDER — ALUM & MAG HYDROXIDE-SIMETH 200-200-20 MG/5ML PO SUSP: 15.0000 mL | Freq: Four times a day (QID) | ORAL | Status: DC | PRN

## 2023-12-01 MED ORDER — LIDOCAINE 5 % EX PTCH
1.0000 | MEDICATED_PATCH | CUTANEOUS | Status: DC
  Administered 2023-12-01 – 2023-12-02 (×2): 1 via TRANSDERMAL
  Filled 2023-12-01 (×2): qty 1

## 2023-12-01 MED ORDER — GABAPENTIN 400 MG PO CAPS
400.0000 mg | ORAL_CAPSULE | Freq: Three times a day (TID) | ORAL | Status: DC
  Administered 2023-12-01 – 2023-12-03 (×5): 400 mg via ORAL
  Filled 2023-12-01 (×4): qty 1

## 2023-12-01 MED ORDER — PANTOPRAZOLE SODIUM 40 MG PO TBEC
40.0000 mg | DELAYED_RELEASE_TABLET | Freq: Every day | ORAL | Status: DC
  Administered 2023-12-01 – 2023-12-03 (×3): 40 mg via ORAL
  Filled 2023-12-01 (×3): qty 1

## 2023-12-01 NOTE — Evaluation (Signed)
Physical Therapy Evaluation Patient Details Name: [name] MRN: [id number] DOB: [birth year] Today's Date: 12/01/2023  History of Present Illness  82 y.o. male presents to [facility] 11/30/23 w/ concern for recurrent falls w/ most recent fall 12/14. Trauma workout showed L 9th rib fx, L LE cellulitis, small L PTX, R upper lobe PNA. Pt also w/ rhabdomyolysis. PMH includes Afib, CVA, GIB, diastolic heart failure, chronic back pain w/ multiple back surgeries, memory deficits, ataxia   Clinical Impression  Pt in bed upon arrival and agreeable to PT eval. Prior to admit, pt was ModI for mobility with a cane. Pt has history of multiple falls with report of tripping and losing his balance. In today's session, pt required assist to don/doff shoes and required cues for sequencing with tasks. Pt required MinA to stand in today's session and was able to ambulate ~60 ft with CGA for steadying assist. Pt presents to therapy session with decreased strength, balance, and mobility. Pt would benefit from acute care PT to address functional deficits. Sister reports she is not able to give physical assistance at home if the pt needs help. Pt would benefit from <3hrs post acute rehab to prevent future falls, however, pt declines. If pt continues to decline, recommending HHPT.  Acute PT to follow.          If plan is discharge home, recommend the following: A little help with walking and/or transfers;Assist for transportation;Help with stairs or ramp for entrance;A little help with bathing/dressing/bathroom   Can travel by private vehicle   Yes    Equipment Recommendations None recommended by PT     Functional Status Assessment Patient has had a recent decline in their functional status and demonstrates the ability to make significant improvements in function in a reasonable and predictable amount of time.     Precautions / Restrictions Precautions Precautions: Fall Precaution Comments: rib  fxs Restrictions Weight Bearing Restrictions Per Provider Order: No      Mobility  Bed Mobility Overal bed mobility: Needs Assistance Bed Mobility: Supine to Sit, Sit to Supine     Supine to sit: Supervision, HOB elevated Sit to supine: Supervision, HOB elevated   General bed mobility comments: supervision for safety, cues to scoot up in the bed once in supine w/ use of rails    Transfers Overall transfer level: Needs assistance Equipment used: Rolling walker (2 wheels) Transfers: Sit to/from Stand Sit to Stand: Min assist      General transfer comment: MinA for boost up    Ambulation/Gait Ambulation/Gait assistance: Contact guard assist Gait Distance (Feet): 60 Feet Assistive device: Rolling walker (2 wheels) Gait Pattern/deviations: Step-through pattern, Shuffle Gait velocity: dec     General Gait Details: short steps w/ decreased gait speed, slightly unsteady w/ CGA to steady      Balance Overall balance assessment: History of Falls, Mild deficits observed, not formally tested, Needs assistance Sitting-balance support: No upper extremity supported, Feet supported Sitting balance-Leahy Scale: Good     Standing balance support: Bilateral upper extremity supported, During functional activity, Reliant on assistive device for balance Standing balance-Leahy Scale: Poor Standing balance comment: reliant on RW          Pertinent Vitals/Pain Pain Assessment Pain Assessment: No/denies pain    Home Living Family/patient expects to be discharged to:: Private residence Living Arrangements: Other relatives (sister) Available Help at Discharge: Family;Available PRN/intermittently Type of Home: House Home Access: Level entry       Home Layout: One level Home Equipment:  Grab bars - toilet;Grab bars - tub/shower;Wheelchair - Forensic psychologist (2 wheels);Cane - single point;Lift chair      Prior Function Prior Level of Function : Independent/Modified  Independent;History of Falls (last six months)        Mobility Comments: modI w/ cane, reports at least 3 falls in the past 6 months. However, states it is not a lot and normal for someone his age ADLs Comments: did not need assist per pt     Extremity/Trunk Assessment   Upper Extremity Assessment Upper Extremity Assessment: Defer to OT evaluation    Lower Extremity Assessment Lower Extremity Assessment: Generalized weakness    Cervical / Trunk Assessment Cervical / Trunk Assessment: Normal  Communication   Communication Communication: No apparent difficulties  Cognition Arousal: Alert Behavior During Therapy: Agitated, Restless Overall Cognitive Status: History of cognitive impairments - at baseline      General Comments: pt restless and agitated about being in the hospital. States "why are they keeping me here. I'm leaving tonight". Needed cues for sequencing with mobility i.e. put on shoes before standing. Did not remember earlier events of the day with RN giving reminders. Limited insight into deficits and medical needs        General Comments General comments (skin integrity, edema, etc.): VSS on RA, bruising and scabs on B LE     PT Assessment Patient needs continued PT services  PT Problem List Decreased strength;Decreased activity tolerance;Decreased mobility;Decreased balance;Decreased safety awareness       PT Treatment Interventions DME instruction;Gait training;Functional mobility training;Therapeutic exercise;Therapeutic activities;Balance training;Neuromuscular re-education;Patient/family education    PT Goals (Current goals can be found in the Care Plan section)  Acute Rehab PT Goals Patient Stated Goal: to go home PT Goal Formulation: With patient/family Time For Goal Achievement: 12/15/23 Potential to Achieve Goals: Good    Frequency Min 1X/week        AM-PAC PT "6 Clicks" Mobility  Outcome Measure Help needed turning from your back to your  side while in a flat bed without using bedrails?: None Help needed moving from lying on your back to sitting on the side of a flat bed without using bedrails?: A Little Help needed moving to and from a bed to a chair (including a wheelchair)?: A Little Help needed standing up from a chair using your arms (e.g., wheelchair or bedside chair)?: A Little Help needed to walk in hospital room?: A Little Help needed climbing 3-5 steps with a railing? : A Lot 6 Click Score: 18    End of Session Equipment Utilized During Treatment: Gait belt Activity Tolerance: Patient tolerated treatment well Patient left: in bed;with call bell/phone within reach;with bed alarm set;with family/visitor present Nurse Communication: Mobility status PT Visit Diagnosis: Unsteadiness on feet (R26.81);Repeated falls (R29.6);Muscle weakness (generalized) (M62.81)    Time: 9604-5409 PT Time Calculation (min) (ACUTE ONLY): 29 min   Charges:   PT Evaluation $PT Eval Low Complexity: 1 Low   PT General Charges $$ ACUTE PT VISIT: 1 Visit         Hilton Cork, PT, DPT Secure Chat Preferred  Rehab Office [phone]   Arturo Morton Brion Aliment 12/01/2023, 4:49 PM

## 2023-12-01 NOTE — Progress Notes (Addendum)
[facility] Progress Note     Subjective: CC:  Resting comfortably. NAD. Denies SOB.    Objective: Vital signs in last 24 hours: Temp:  [97.7 F (36.5 C)-98.5 F (36.9 C)] 97.8 F (36.6 C) (12/15 0557) Pulse Rate:  [88-100] 100 (12/15 0557) Resp:  [18-24] 18 (12/15 0557) BP: (98-133)/(51-100) 133/69 (12/15 0557) SpO2:  [93 %-100 %] 97 % (12/15 0557) Weight:  [107.4 kg] 107.4 kg (12/14 2200) Last BM Date :  (PTA)  Intake/Output from previous day: 12/14 0701 - 12/15 0700 In: 120 [P.O.:120] Out: 200 [Urine:200] Intake/Output this shift: No intake/output data recorded.  PE: Gen:  eyes closed, NAD, responding to voice Card:  Regular rate and rhythm, BLE edema present  Pulm:  mid chest wall tenderness, Normal effort ORA, clear to auscultation bilaterally Abd: Soft, non-tender, non-distended Skin: warm and dry, no rashes  Psych: A&Ox3   Lab Results:  Recent Labs    11/30/23 1418 11/30/23 1442 12/01/23 0649  WBC 11.2*  --  7.4  HGB 14.2 15.6 12.3*  HCT 44.3 46.0 37.9*  PLT 253  --  231   BMET Recent Labs    11/30/23 1418 11/30/23 1442 12/01/23 0649 12/01/23 0650  NA 134* 136 137  --   K 4.3 4.5 3.9  --   CL 100 103 104  --   CO2 23  --  26  --   GLUCOSE 93 90 77  --   BUN 23 26* 20  --   CREATININE 1.04 0.90 1.36* 1.43*  CALCIUM 9.1  --  8.7*  --    PT/INR Recent Labs    11/30/23 1418  LABPROT 13.1  INR 1.0   CMP     Component Value Date/Time   NA 137 12/01/2023 0649   NA 141 03/12/2013 0937   K 3.9 12/01/2023 0649   CL 104 12/01/2023 0649   CO2 26 12/01/2023 0649   GLUCOSE 77 12/01/2023 0649   BUN 20 12/01/2023 0649   BUN 12 03/12/2013 0937   CREATININE 1.43 (H) 12/01/2023 0650   CALCIUM 8.7 (L) 12/01/2023 0649   PROT 6.8 11/30/2023 1606   PROT 6.6 03/12/2013 0937   ALBUMIN 3.0 (L) 11/30/2023 1606   ALBUMIN 4.2 03/12/2013 0937   AST 58 (H) 11/30/2023 1606   ALT 25 11/30/2023 1606   ALKPHOS 78 11/30/2023 1606   BILITOT  1.4 (H) 11/30/2023 1606   GFRNONAA 49 (L) 12/01/2023 0650   GFRAA >60 11/16/2019 0242   Lipase     Component Value Date/Time   LIPASE 21 11/30/2023 1606       Studies/Results: DG CHEST PORT 1 VIEW Result Date: 12/01/2023 CLINICAL DATA:  [id number] Pneumothorax [id number] EXAM: PORTABLE CHEST 1 VIEW COMPARISON:  Chest radiograph from one day prior. FINDINGS: Partially visualized surgical hardware from ACDF and posterior spinal fusion in the lower cervical spine. TAVR in place. Bilateral posterior spinal fusion hardware in the lower thoracic spine again noted. Stable cardiomediastinal silhouette with mild cardiomegaly. No pneumothorax. No pleural effusion. Borderline mild pulmonary edema, similar. IMPRESSION: 1. No pneumothorax. 2. Borderline mild congestive heart failure, similar. Electronically Signed   By: Delbert Phenix M.D.   On: 12/01/2023 08:16   DG CHEST PORT 1 VIEW Result Date: 11/30/2023 CLINICAL DATA:  Follow-up pneumothorax EXAM: PORTABLE CHEST 1 VIEW COMPARISON:  None Available. FINDINGS: Evaluation is compromised by patient positioning. No definite pneumothorax. No focal consolidation or pleural effusion. Cardiomegaly and pulmonary vascular congestion. Spinal fusion hardware. The left ninth  rib fracture is not well demonstrated radiographically. IMPRESSION: No definite pneumothorax. Electronically Signed   By: Minerva Fester M.D.   On: 11/30/2023 21:05   CT MAXILLOFACIAL WO CONTRAST Result Date: 11/30/2023 CLINICAL DATA:  Facial trauma, blunt.  Fall. EXAM: CT MAXILLOFACIAL WITHOUT CONTRAST TECHNIQUE: Multidetector CT imaging of the maxillofacial structures was performed. Multiplanar CT image reconstructions were also generated. RADIATION DOSE REDUCTION: This exam was performed according to the departmental dose-optimization program which includes automated exposure control, adjustment of the mA and/or kV according to patient size and/or use of iterative reconstruction technique.  COMPARISON:  Same-day head and cervical spine CT. FINDINGS: Osseous: No fracture or mandibular dislocation. No destructive process. Orbits: Negative. No traumatic or inflammatory finding. Sinuses: Clear. Soft tissues: Mild soft tissue swelling along the right cheek. Limited intracranial: No significant or unexpected finding. IMPRESSION: Mild soft tissue swelling along the right cheek. No acute facial bone fracture. Electronically Signed   By: Orvan Falconer M.D.   On: 11/30/2023 20:28   CT CHEST ABDOMEN PELVIS W CONTRAST Result Date: 11/30/2023 CLINICAL DATA:  Ground level fall on blood thinners altered EXAM: CT CHEST, ABDOMEN, AND PELVIS WITH CONTRAST TECHNIQUE: Multidetector CT imaging of the chest, abdomen and pelvis was performed following the standard protocol during bolus administration of intravenous contrast. RADIATION DOSE REDUCTION: This exam was performed according to the departmental dose-optimization program which includes automated exposure control, adjustment of the mA and/or kV according to patient size and/or use of iterative reconstruction technique. CONTRAST:  75mL OMNIPAQUE IOHEXOL 350 MG/ML SOLN COMPARISON:  Radiographs 11/30/2023, chest CT 10/26/2023, CT chest abdomen pelvis 05/14/2023 FINDINGS: CT CHEST FINDINGS Cardiovascular: Nonaneurysmal aorta. Mild atherosclerosis. No dissection. Normal contour. Aortic valve prosthesis. No pericardial effusion Mediastinum/Nodes: Patent trachea. No thyroid mass. No suspicious lymph nodes. Esophagus within normal limits. Lungs/Pleura: Clearing of previously noted left lower lobe pneumonia. Interim development of patchy ground-glass disease in the right upper lobe. Tiny air within the pleural space at the left apex. Small pneumothorax at the left anterior lung base. Musculoskeletal: Advanced multilevel degenerative changes. Posterior spinal hardware T10 and T11 with osseous fusion. Acute displaced left ninth posterolateral rib fracture. Multiple old  appearing bilateral rib fractures. Gynecomastia. CT ABDOMEN PELVIS FINDINGS Hepatobiliary: No focal liver abnormality is seen. No gallstones, gallbladder wall thickening, or biliary dilatation. Pancreas: Unremarkable. No pancreatic ductal dilatation or surrounding inflammatory changes. Spleen: Normal in size without focal abnormality. Adrenals/Urinary Tract: Adrenal glands are normal. Small nonobstructing right kidney stone. Bilateral renal cysts for which no imaging follow-up is recommended. No hydronephrosis. Multiple small bladder diverticula. Bilateral renal cortical atrophy. Stomach/Bowel: Postsurgical changes of the stomach. No dilated small bowel. No acute bowel wall thickening. Negative appendix Vascular/Lymphatic: Moderate aortic atherosclerosis. No aneurysm. No suspicious lymph nodes. Reproductive: Negative prostate Other: Negative for free air or free fluid. Small fat containing inguinal hernias.Moderate fat containing supraumbilical ventral hernia. Small fat containing umbilical hernia. Musculoskeletal: Multilevel lumbar degenerative change. No acute osseous abnormality. IMPRESSION: 1. Acute displaced left ninth posterolateral rib fracture. Trace left pneumothorax. 2. Interim development of patchy ground-glass disease in the right upper lobe, suspicious for pneumonia. Clearing of previously noted left lower lobe airspace disease. 3. No CT evidence for acute intra-abdominal or pelvic abnormality. 4. Small nonobstructing right kidney stone. Multiple small bladder diverticula. 5. Aortic atherosclerosis. Aortic Atherosclerosis (ICD10-I70.0). Electronically Signed   By: Jasmine Pang M.D.   On: 11/30/2023 16:42   CT Head Wo Contrast Result Date: 11/30/2023 CLINICAL DATA:  Head trauma, moderate-severe; Neck trauma (Age >=  65y) EXAM: CT HEAD WITHOUT CONTRAST CT CERVICAL SPINE WITHOUT CONTRAST TECHNIQUE: Multidetector CT imaging of the head and cervical spine was performed following the standard protocol  without intravenous contrast. Multiplanar CT image reconstructions of the cervical spine were also generated. RADIATION DOSE REDUCTION: This exam was performed according to the departmental dose-optimization program which includes automated exposure control, adjustment of the mA and/or kV according to patient size and/or use of iterative reconstruction technique. COMPARISON:  None Available. FINDINGS: CT HEAD FINDINGS Brain: No evidence of acute infarction, hemorrhage, hydrocephalus, extra-axial collection or mass lesion/mass effect. Vascular: Calcific atherosclerosis. Skull: No acute fracture. Sinuses/Orbits: No acute abnormality. Other: No mastoid effusions. CT CERVICAL SPINE FINDINGS Alignment: Similar alignment in comparison to the prior. No new sagittal subluxation. Skull base and vertebrae: Motion limited assessment without evidence of acute fracture. Extensive ankylosis throughout the cervical spine with skull base to T1 posterior fusion at C6-C7 ACDF. Osteopenia. Soft tissues and spinal canal: No prevertebral fluid or swelling. No visible canal hematoma. Disc levels: Severe multilevel bony degenerative change, not substantially changed. Upper chest: Further evaluated on same day CT chest/abdomen/pelvis. IMPRESSION: No evidence of acute abnormality intracranially or in the cervical spine. Electronically Signed   By: Feliberto Harts M.D.   On: 11/30/2023 15:32   CT Cervical Spine Wo Contrast Result Date: 11/30/2023 CLINICAL DATA:  Head trauma, moderate-severe; Neck trauma (Age >= 65y) EXAM: CT HEAD WITHOUT CONTRAST CT CERVICAL SPINE WITHOUT CONTRAST TECHNIQUE: Multidetector CT imaging of the head and cervical spine was performed following the standard protocol without intravenous contrast. Multiplanar CT image reconstructions of the cervical spine were also generated. RADIATION DOSE REDUCTION: This exam was performed according to the departmental dose-optimization program which includes automated  exposure control, adjustment of the mA and/or kV according to patient size and/or use of iterative reconstruction technique. COMPARISON:  None Available. FINDINGS: CT HEAD FINDINGS Brain: No evidence of acute infarction, hemorrhage, hydrocephalus, extra-axial collection or mass lesion/mass effect. Vascular: Calcific atherosclerosis. Skull: No acute fracture. Sinuses/Orbits: No acute abnormality. Other: No mastoid effusions. CT CERVICAL SPINE FINDINGS Alignment: Similar alignment in comparison to the prior. No new sagittal subluxation. Skull base and vertebrae: Motion limited assessment without evidence of acute fracture. Extensive ankylosis throughout the cervical spine with skull base to T1 posterior fusion at C6-C7 ACDF. Osteopenia. Soft tissues and spinal canal: No prevertebral fluid or swelling. No visible canal hematoma. Disc levels: Severe multilevel bony degenerative change, not substantially changed. Upper chest: Further evaluated on same day CT chest/abdomen/pelvis. IMPRESSION: No evidence of acute abnormality intracranially or in the cervical spine. Electronically Signed   By: Feliberto Harts M.D.   On: 11/30/2023 15:32   DG Pelvis Portable Result Date: 11/30/2023 CLINICAL DATA:  Fall EXAM: PORTABLE PELVIS 1-2 VIEWS COMPARISON:  08/09/2023 FINDINGS: There is no evidence of pelvic fracture or diastasis. Mild degenerative changes of both hips without evidence of fracture or dislocation. No pelvic bone lesions are seen. IMPRESSION: Negative. Electronically Signed   By: Duanne Guess D.O.   On: 11/30/2023 14:59   DG Chest Portable 1 View Result Date: 11/30/2023 CLINICAL DATA:  Chest pain, fall EXAM: PORTABLE CHEST 1 VIEW COMPARISON:  10/26/2023 FINDINGS: Stable cardiomegaly. Aortic atherosclerosis. Subtle airspace opacity in the right upper lobe. No pleural effusion or pneumothorax. IMPRESSION: Subtle airspace opacity in the right upper lobe, which may represent atelectasis or pneumonia.  Electronically Signed   By: Duanne Guess D.O.   On: 11/30/2023 14:58    Anti-infectives: Anti-infectives (From admission, onward)  Start     Dose/Rate Route Frequency Ordered Stop   12/01/23 1000  doxycycline (VIBRA-TABS) tablet 100 mg        100 mg Oral Every 12 hours 11/30/23 1843     11/30/23 1700  cefTRIAXone (ROCEPHIN) 2 g in sodium chloride 0.9 % 100 mL IVPB        2 g 200 mL/hr over 30 Minutes Intravenous  Once 11/30/23 1647 11/30/23 1801   11/30/23 1700  azithromycin (ZITHROMAX) 500 mg in sodium chloride 0.9 % 250 mL IVPB        500 mg 250 mL/hr over 60 Minutes Intravenous  Once 11/30/23 1647 11/30/23 2053        Assessment/Plan  [name] is an 82 y.o. male who presents to [facility] ED s/p fall 2 days ago.   Known Injuries: - Left 9th displaced rib fracture - Trace left pneumothorax at left anterior lung base - facial brusing without underlying fracture   Plan:  CXR today shows resolution of PTX  Continue multimodal pain control and pulm toilet.  No additional injuries identified. Trauma will sign off.   FEN: HH diet VTE: eliquis held, consider risk vs benefit of this medication given falls    LOS: 1 day   I reviewed nursing notes, hospitalist notes, last 24 h vitals and pain scores, last 48 h intake and output, last 24 h labs and trends, and last 24 h imaging results.  This care required straight forward level of medical decision making.   Hosie Spangle, PA-C [facility] Please see Amion for pager number during day hours 7:00am-4:30pm

## 2023-12-01 NOTE — Plan of Care (Signed)

## 2023-12-01 NOTE — TOC CAGE-AID Note (Signed)
Transition of Care [facility]) - CAGE-AID Screening   Patient Details  Name: [name] MRN: [id number] Date of Birth: 02/24/1941  Transition of Care [facility]) CM/SW Contact:    Janora Norlander, RN Phone Number: [phone] 12/01/2023, 4:38 PM   Clinical Narrative: Pt here after having a fall and sustaining a left rib fracture with a trace pneumo.  Pt states he quit smoking 38 years ago and denies alcohol or drug use.  Screening complete.   CAGE-AID Screening:    Have You Ever Felt You Ought to Cut Down on Your Drinking or Drug Use?: No Have People Annoyed You By Critizing Your Drinking Or Drug Use?: No Have You Felt Bad Or Guilty About Your Drinking Or Drug Use?: No Have You Ever Had a Drink or Used Drugs First Thing In The Morning to Steady Your Nerves or to Get Rid of a Hangover?: No CAGE-AID Score: 0  Substance Abuse Education Offered: No

## 2023-12-01 NOTE — Progress Notes (Signed)
PROGRESS NOTE  [name]  DOB: March 15, 1941  PCP: Dois Davenport, MD NAT:[id number]  DOA: 11/30/2023  LOS: 1 day  Hospital Day: 2  Brief narrative: [name] is a 82 y.o. male with PMH significant for HTN, CAD, AS s/p TAVR, PFO, paroxysmal A-fib on Eliquis, CVA, spinal stenosis, chronic back pain, multiple back surgeries and recurrent falls 12/14, patient was brought to the ED by EMS for altered mental status, multiple falls in the last 2 days.   Patient has been having recurrent falls for the last few months. On 12/11, patient was sitting in a chair, he leaned back on the chair and hit the wall behind following which the chair broke.  Patient had an impact on his head and has been altered since then. 12/14, patient's history found him sitting on the floor to walk on the side, unsure how he got to the floor. Brought to the ED by EMS  In the ED, patient is afebrile, heart rate in 90s, blood pressure in normal range. On exam she was noted to have facial ecchymosis and hematoma.  Also seen to have bilateral lower extremity swelling and left leg cellulitic changes. Labs with WBC count 11.2, BMP normal CT head and CT cervical spine unremarkable CT chest abdomen pelvis showed 1. Acute displaced left ninth posterolateral rib fracture. Trace left pneumothorax. 2. Interim development of patchy ground-glass disease in the right upper lobe, suspicious for pneumonia. Clearing of previously noted left lower lobe airspace disease. 3. No CT evidence for acute intra-abdominal or pelvic abnormality. 4. Small nonobstructing right kidney stone. Multiple small bladder diverticula.  Admitted to [facility]  Subjective: Patient was seen and examined this morning.  Pleasant elderly Caucasian male.  Lying on bed.  Was complaining of pain on the left side of the chest where he had rib fracture.  Also complaining of heartburn No MD at bedside. Chart reviewed Afebrile, heart rate in 100s, breathing on room  air Labs this morning with WBC count normal at 7.4, creatinine up at 1.43.  Assessment and plan: Acute left ninth rib fracture Trace left pneumothorax Secondary to fall Respiratory status stable Lidocaine patch ordered for pain. Repeat chest x-ray this morning showed resolution of fracture  Right upper lobe pneumonia Mildly elevated WBC count, no lactic acidosis Given initial dose of IV Rocephin and IV azithromycin in the ED.  Started on doxycycline Recent Labs  Lab 11/30/23 1418 11/30/23 1441 12/01/23 0649  WBC 11.2*  --  7.4  LATICACIDVEN  --  1.9  --    Facial ecchymosis CT maxillofacial was obtained which showed mild soft tissue swelling along the right cheek. No acute facial bone fracture.  Bilateral lower extremity swelling Left leg cellulitis On doxycycline.  Continue to monitor clinically  Recurrent falls Worsening last few months. Skeletal survey did not show any other fracture other than rib fracture mentioned above Obtain PT eval. Given recurrent falls, I would suggest to permanently stop anticoagulation.  Rhabdomyolysis Probably because of trauma and the fact that he was on the floor for unknown duration.  CK level elevated to over 1000. Initially given IV hydration in the ED. Given overall hypervolemic status, IV fluid was avoided.  Encourage oral hydration.  CK seems to be improving. Recent Labs  Lab 11/30/23 1606 12/01/23 0649  CKTOTAL 1,065* 565*   Chronic diastolic CHF Hypertension PTA meds- Toprol 25 mg daily, Cardizem 120 mg daily, Lasix 20 mg daily, Aldactone 25 mg daily I will continue Toprol and Cardizem.  Keep diuretics on hold for now.  Paroxysmal A-fib PTA meds- Cardizem 120 mg daily CHADSVASC score high at 7.  Chronically on Eliquis.  However given recurrent fall, I would advise to hold anticoagulation  H/o CVA, HLD With memory deficits and ataxia mainly as residual effects.  PTA meds- simvastatin 40 mg daily Resume aspirin.  Holding  anticoagulation for now   Chronic back pain Patient has had multiple back surgeries and on multiple pain medication/muscle relaxants/neuropathic agents at baseline.  These are also likely contributing to his falls. PTA meds- Wellbutrin 100 mg twice daily, Neurontin 800 mg 3 times daily, Robaxin 5 mg twice daily, Requip 0.25 mg twice daily, Percocet PRN, Topamax BID Currently continued on all.  Chronic vitamin B12 deficiency Macrocytosis On IM vitamin B12 every 4 weeks  BPH Flomax  Mobility: PT eval pending  Goals of care   Code Status: Limited: Do not attempt resuscitation (DNR) -DNR-LIMITED -Do Not Intubate/DNI      DVT prophylaxis: Lovenox    Antimicrobials: Doxycycline Fluid: None Consultants: None Family Communication: None at bedside  Status: Inpatient Level of care:  Telemetry Medical   Patient is from: Home Needs to continue in-hospital care: Pending PT eval Anticipated d/c to: Pending clinical course and PT input   Diet:  Diet Order             Diet Heart Room service appropriate? Yes; Fluid consistency: Thin  Diet effective now                   Scheduled Meds:  ascorbic acid  250 mg Oral BID   aspirin EC  81 mg Oral Daily   buPROPion  100 mg Oral BID   cyanocobalamin  1,000 mcg Intramuscular Q28 days   diltiazem  120 mg Oral Daily   doxycycline  100 mg Oral Q12H   enoxaparin (LOVENOX) injection  50 mg Subcutaneous Q24H   ferrous sulfate  325 mg Oral BID WC   gabapentin  800 mg Oral TID   lidocaine  1 patch Transdermal Q24H   metoprolol succinate  25 mg Oral Daily   pantoprazole  40 mg Oral Daily   potassium chloride SA  20 mEq Oral Daily   rOPINIRole  0.25 mg Oral BID   simvastatin  40 mg Oral QHS   sucralfate  1 g Oral BID   tamsulosin  0.4 mg Oral Daily   topiramate  25 mg Oral BID    PRN meds: albuterol, alum & mag hydroxide-simeth, hydrALAZINE, methocarbamol, ondansetron **OR** ondansetron (ZOFRAN) IV, oxyCODONE-acetaminophen  **AND** oxyCODONE, polyethylene glycol   Infusions:    Antimicrobials: Anti-infectives (From admission, onward)    Start     Dose/Rate Route Frequency Ordered Stop   12/01/23 1000  doxycycline (VIBRA-TABS) tablet 100 mg        100 mg Oral Every 12 hours 11/30/23 1843     11/30/23 1700  cefTRIAXone (ROCEPHIN) 2 g in sodium chloride 0.9 % 100 mL IVPB        2 g 200 mL/hr over 30 Minutes Intravenous  Once 11/30/23 1647 11/30/23 1801   11/30/23 1700  azithromycin (ZITHROMAX) 500 mg in sodium chloride 0.9 % 250 mL IVPB        500 mg 250 mL/hr over 60 Minutes Intravenous  Once 11/30/23 1647 11/30/23 2053       Objective: Vitals:   11/30/23 2350 12/01/23 0557  BP: (!) 98/51 133/69  Pulse: 88 100  Resp: 18 18  Temp: 98.5  F (36.9 C) 97.8 F (36.6 C)  SpO2: 95% 97%    Intake/Output Summary (Last 24 hours) at 12/01/2023 1201 Last data filed at 12/01/2023 0200 Gross per 24 hour  Intake 120 ml  Output 200 ml  Net -80 ml   Filed Weights   11/30/23 2200  Weight: 107.4 kg   Weight change:  Body mass index is 34.97 kg/m.   Physical Exam: General exam: Pleasant, elderly.  Complaining of anterior chest pain Skin: No rashes, lesions or ulcers. HEENT: Atraumatic, normocephalic, no obvious bleeding Lungs: Huel Cote to auscultation bilaterally.  Tenderness present in left lateral chest wall  CVS: Regular rate and rhythm, no murmur GI/Abd soft, nontender, nondistended, bowel sound present CNS: Alert, awake, knows he is in the hospital Psychiatry: Sad affect Extremities: No pedal edema, no calf tenderness  Data Review: I have personally reviewed the laboratory data and studies available.  F/u labs ordered Unresulted Labs (From admission, onward)     Start     Ordered   12/07/23 0500  Creatinine, serum  (enoxaparin (LOVENOX)    CrCl >/= 30 ml/min)  Weekly,   R     Comments: while on enoxaparin therapy    11/30/23 1847            Total time spent in review of labs and  imaging, patient evaluation, formulation of plan, documentation and communication with family: 45 minutes  Signed, Lorin Glass, MD [facility] 12/01/2023

## 2023-12-01 NOTE — ED Provider Notes (Signed)
  Physical Exam  BP 138/65 (BP Location: Left Arm)   Pulse 88   Temp 98 F (36.7 C) (Oral)   Resp 18   Ht 5\' 9"  (1.753 m)   Wt 107.4 kg Comment: Weight from 10/2023  SpO2 98%   BMI 34.97 kg/m   Physical Exam Constitutional:      General: He is not in acute distress.    Appearance: He is not toxic-appearing.  Cardiovascular:     Heart sounds: Normal heart sounds.  Pulmonary:     Breath sounds: Normal breath sounds.  Skin:    Comments: Has bruising across the right face, right anterior neck, right flank, and right hip.  Neurological:     Mental Status: He is alert.     Comments: Moves all 4 extremity spontaneously     Procedures  Procedures  ED Course / MDM   Clinical Course as of 12/01/23 1512  Sat Nov 30, 2023  1538 Fall on eliquis Witnessed fall 2 days ago Has been altered since the fall EMS called again today, and pt was found down Will need admission [JR]  1641 Small PTX on L L 9th rib fx ?PNA in RUL [JR]  1701 Repeat CXR at 9 PM and another in the AM Trauma recommends admit to medicine, per Dr. Azucena Cecil [JR]    Clinical Course User Index [JR] Rolla Flatten, MD   Medical Decision Making Amount and/or Complexity of Data Reviewed Labs: ordered. Radiology: ordered.  Risk Prescription drug management. Decision regarding hospitalization.   Patient assumed at 3 PM.  Patient presented here after being found down today.  He also had a witnessed fall 2 days ago.  He has CT chest/abdomen/pelvis pending as well as CK and troponin.  Follow-up labs and imaging.  I independently interpreted the patient's imaging.  He does have a left ninth rib fracture with trace pneumothorax.  I spoke with trauma surgery, Dr. Azucena Cecil.  She does not feel that this requires intervention.  Recommends repeat chest x-ray at 9 PM as well as repeat chest x-ray in the morning.  Patient also has possible right upper lobe pneumonia, which could be from aspiration.  Will empirically treat with  Rocephin and azithromycin for community-acquired pneumonia.  K is elevated greater than 1000, which is consistent with rhabdomyolysis.  Troponin within normal limits.  Start patient on continuous IV fluids for treatment of rhabdomyolysis.  Patient be most appropriate for admission to medicine.  I spoke with the internal medicine team, who has accepted him for admission.      Rolla Flatten, MD 12/01/23 1514    Arby Barrette, MD 12/06/23 [phone]

## 2023-12-02 DIAGNOSIS — S2232XA Fracture of one rib, left side, initial encounter for closed fracture: Secondary | ICD-10-CM | POA: Diagnosis not present

## 2023-12-02 DIAGNOSIS — T796XXA Traumatic ischemia of muscle, initial encounter: Secondary | ICD-10-CM

## 2023-12-02 DIAGNOSIS — J189 Pneumonia, unspecified organism: Secondary | ICD-10-CM | POA: Diagnosis not present

## 2023-12-02 DIAGNOSIS — S270XXA Traumatic pneumothorax, initial encounter: Secondary | ICD-10-CM | POA: Diagnosis not present

## 2023-12-02 DIAGNOSIS — W19XXXA Unspecified fall, initial encounter: Secondary | ICD-10-CM | POA: Diagnosis not present

## 2023-12-02 LAB — BASIC METABOLIC PANEL
Anion gap: 6 (ref 5–15)
BUN: 22 mg/dL (ref 8–23)
CO2: 29 mmol/L (ref 22–32)
Calcium: 9.2 mg/dL (ref 8.9–10.3)
Chloride: 102 mmol/L (ref 98–111)
Creatinine, Ser: 1.14 mg/dL (ref 0.61–1.24)
GFR, Estimated: >60 mL/min (ref >60)
Glucose, Bld: 86 mg/dL (ref 70–99)
Potassium: 5.1 mmol/L (ref 3.5–5.1)
Sodium: 137 mmol/L (ref 135–145)

## 2023-12-02 MED ORDER — METHOCARBAMOL 500 MG PO TABS
500.0000 mg | ORAL_TABLET | Freq: Three times a day (TID) | ORAL | Status: DC | PRN
  Administered 2023-12-02 – 2023-12-03 (×3): 500 mg via ORAL
  Filled 2023-12-02 (×4): qty 1

## 2023-12-02 MED ORDER — OXYCODONE-ACETAMINOPHEN 5-325 MG PO TABS
1.0000 | ORAL_TABLET | ORAL | Status: DC | PRN
  Administered 2023-12-02 – 2023-12-03 (×5): 1 via ORAL
  Filled 2023-12-02 (×5): qty 1

## 2023-12-02 NOTE — Progress Notes (Addendum)
[facility] PROGRESS NOTE    Progress Note  [name]  WJX:[id number] DOB: 14-Nov-1941 DOA: 11/30/2023 PCP: Dois Davenport, MD     Brief Narrative:   [name] is an 82 y.o. male past medical history of essential hypertension, CAD aortic stenosis status post TAVR's, paroxysmal atrial fibrillation on Eliquis, chronic back pain with multiple back surgeries and recurrent falls brought in to the hospital for altered mental status that started 2 days prior to admission he was found on the floor EMS was called found to have facial ecchymosis and hematoma bilateral lower extremity with cellulitic changes white count of 11 CT scan of the head and C-spine were unremarkable.  CT of the chest and abdomen showed acute displaced ninth posterolateral rib fracture with trace pneumothorax with patchy groundglass opacity in the right lung   Assessment/Plan:   Right upper lobe pneumonia: He was started empirically on IV Rocephin and azithromycin. Now de-escalated to oral Doxy has remained febrile with no leukocytosis.  Acute left ninth rib displaced fracture with trace pneumothorax: Secondary to fall. Respiratory status stable. Chest x-ray show resolved pneumothorax. Continue pain medication and lidocaine patch. PT OT has been consulted, will need skilled nursing facility upon discharge.  Facial ecchymosis: CT maxillofacial showed no acute fractures but did show mild soft tissue swelling  Bilateral lower extremity swelling/left lower extremity cellulitis: Currently on doxycycline. Has remained afebrile with a leukocytosis.  Recurrent falls: Worsening over the last several months. Skeletal survey showed fracture. PT evaluated the patient. Will need skilled nursing facility. Due to his recurrent falls anticoagulation was discontinued. He is no longer a candidate for anticoagulation. I am wondering if some of his medication can be contributing to his falls. Will go ahead and stop  his Topamax, Robaxin and Requip. Will continue Neurontin at a lower dose, Wellbutrin and narcotics for pain.  Traumatic rhabdomyolysis: Improving with oral hydration. Encourage oral hydration.  Acute kidney injury: Likely prerenal azotemia resolved with IV fluids.  Chronic diastolic dysfunction/essential hypertension: Continue Toprol and Cardizem. Diuretics held Lasix and Aldactone.  Paroxysmal atrial fibrillation: With a chads Vascor 7. Continue Toprol and Cardizem. The patient is not a candidate for anticoagulation due to multiple falls.  History of CVA: Continue statins, not a candidate for anticoagulation.  Chronic back pain: At home the patient is on Wellbutrin, Neurontin, Robaxin, Requip Percocet and Topamax. Could be contributing to his recurrent falls.  Macrocytic anemia/vitamin B12 deficiency: Continue intramuscular B12.  BPH: Continue Flomax.    DVT prophylaxis: lovenox Family Communication:none Status is: Inpatient Remains inpatient appropriate because: Left lower extremity cellulitis.    Code Status:     Code Status Orders  (From admission, onward)           Start     Ordered   11/30/23 1846  Do not attempt resuscitation (DNR)- Limited -Do Not Intubate (DNI)  Continuous       Question Answer Comment  If pulseless and not breathing No CPR or chest compressions.   In Pre-Arrest Conditions (Patient Is Breathing and Has A Pulse) Do not intubate. Provide all appropriate non-invasive medical interventions. Avoid ICU transfer unless indicated or required.   Consent: Discussion documented in EHR or advanced directives reviewed      11/30/23 1847           Code Status History     Date Active Date Inactive Code Status Order ID Comments User Context   10/26/2023 1935 10/27/2023 1833 Full Code [id number]  Darreld Mclean  R, MD Inpatient   11/04/2021 0147 11/06/2021 1833 Full Code [id number]  Anselm Jungling, DO ED   11/10/2019 0800 11/16/2019 1848 Full  Code [id number]  Clydie Braun, MD ED   10/06/2019 2053 10/08/2019 1959 Full Code [id number]  John Giovanni, MD ED   10/13/2018 1608 10/16/2018 1934 Full Code [id number]  Jonah Blue, MD ED   09/18/2018 2134 09/20/2018 1940 Full Code [id number]  Creig Hines, NP Inpatient         IV Access:   Peripheral IV   Procedures and diagnostic studies:   DG CHEST PORT 1 VIEW Result Date: 12/01/2023 CLINICAL DATA:  244010 Pneumothorax [id number] EXAM: PORTABLE CHEST 1 VIEW COMPARISON:  Chest radiograph from one day prior. FINDINGS: Partially visualized surgical hardware from ACDF and posterior spinal fusion in the lower cervical spine. TAVR in place. Bilateral posterior spinal fusion hardware in the lower thoracic spine again noted. Stable cardiomediastinal silhouette with mild cardiomegaly. No pneumothorax. No pleural effusion. Borderline mild pulmonary edema, similar. IMPRESSION: 1. No pneumothorax. 2. Borderline mild congestive heart failure, similar. Electronically Signed   By: Delbert Phenix M.D.   On: 12/01/2023 08:16   DG CHEST PORT 1 VIEW Result Date: 11/30/2023 CLINICAL DATA:  Follow-up pneumothorax EXAM: PORTABLE CHEST 1 VIEW COMPARISON:  None Available. FINDINGS: Evaluation is compromised by patient positioning. No definite pneumothorax. No focal consolidation or pleural effusion. Cardiomegaly and pulmonary vascular congestion. Spinal fusion hardware. The left ninth rib fracture is not well demonstrated radiographically. IMPRESSION: No definite pneumothorax. Electronically Signed   By: Minerva Fester M.D.   On: 11/30/2023 21:05   CT MAXILLOFACIAL WO CONTRAST Result Date: 11/30/2023 CLINICAL DATA:  Facial trauma, blunt.  Fall. EXAM: CT MAXILLOFACIAL WITHOUT CONTRAST TECHNIQUE: Multidetector CT imaging of the maxillofacial structures was performed. Multiplanar CT image reconstructions were also generated. RADIATION DOSE REDUCTION: This exam was performed according to the  departmental dose-optimization program which includes automated exposure control, adjustment of the mA and/or kV according to patient size and/or use of iterative reconstruction technique. COMPARISON:  Same-day head and cervical spine CT. FINDINGS: Osseous: No fracture or mandibular dislocation. No destructive process. Orbits: Negative. No traumatic or inflammatory finding. Sinuses: Clear. Soft tissues: Mild soft tissue swelling along the right cheek. Limited intracranial: No significant or unexpected finding. IMPRESSION: Mild soft tissue swelling along the right cheek. No acute facial bone fracture. Electronically Signed   By: Orvan Falconer M.D.   On: 11/30/2023 20:28   CT CHEST ABDOMEN PELVIS W CONTRAST Result Date: 11/30/2023 CLINICAL DATA:  Ground level fall on blood thinners altered EXAM: CT CHEST, ABDOMEN, AND PELVIS WITH CONTRAST TECHNIQUE: Multidetector CT imaging of the chest, abdomen and pelvis was performed following the standard protocol during bolus administration of intravenous contrast. RADIATION DOSE REDUCTION: This exam was performed according to the departmental dose-optimization program which includes automated exposure control, adjustment of the mA and/or kV according to patient size and/or use of iterative reconstruction technique. CONTRAST:  75mL OMNIPAQUE IOHEXOL 350 MG/ML SOLN COMPARISON:  Radiographs 11/30/2023, chest CT 10/26/2023, CT chest abdomen pelvis 05/14/2023 FINDINGS: CT CHEST FINDINGS Cardiovascular: Nonaneurysmal aorta. Mild atherosclerosis. No dissection. Normal contour. Aortic valve prosthesis. No pericardial effusion Mediastinum/Nodes: Patent trachea. No thyroid mass. No suspicious lymph nodes. Esophagus within normal limits. Lungs/Pleura: Clearing of previously noted left lower lobe pneumonia. Interim development of patchy ground-glass disease in the right upper lobe. Tiny air within the pleural space at the left apex. Small pneumothorax at the left anterior lung base.  Musculoskeletal: Advanced multilevel  degenerative changes. Posterior spinal hardware T10 and T11 with osseous fusion. Acute displaced left ninth posterolateral rib fracture. Multiple old appearing bilateral rib fractures. Gynecomastia. CT ABDOMEN PELVIS FINDINGS Hepatobiliary: No focal liver abnormality is seen. No gallstones, gallbladder wall thickening, or biliary dilatation. Pancreas: Unremarkable. No pancreatic ductal dilatation or surrounding inflammatory changes. Spleen: Normal in size without focal abnormality. Adrenals/Urinary Tract: Adrenal glands are normal. Small nonobstructing right kidney stone. Bilateral renal cysts for which no imaging follow-up is recommended. No hydronephrosis. Multiple small bladder diverticula. Bilateral renal cortical atrophy. Stomach/Bowel: Postsurgical changes of the stomach. No dilated small bowel. No acute bowel wall thickening. Negative appendix Vascular/Lymphatic: Moderate aortic atherosclerosis. No aneurysm. No suspicious lymph nodes. Reproductive: Negative prostate Other: Negative for free air or free fluid. Small fat containing inguinal hernias.Moderate fat containing supraumbilical ventral hernia. Small fat containing umbilical hernia. Musculoskeletal: Multilevel lumbar degenerative change. No acute osseous abnormality. IMPRESSION: 1. Acute displaced left ninth posterolateral rib fracture. Trace left pneumothorax. 2. Interim development of patchy ground-glass disease in the right upper lobe, suspicious for pneumonia. Clearing of previously noted left lower lobe airspace disease. 3. No CT evidence for acute intra-abdominal or pelvic abnormality. 4. Small nonobstructing right kidney stone. Multiple small bladder diverticula. 5. Aortic atherosclerosis. Aortic Atherosclerosis (ICD10-I70.0). Electronically Signed   By: Jasmine Pang M.D.   On: 11/30/2023 16:42   CT Head Wo Contrast Result Date: 11/30/2023 CLINICAL DATA:  Head trauma, moderate-severe; Neck trauma (Age >=  65y) EXAM: CT HEAD WITHOUT CONTRAST CT CERVICAL SPINE WITHOUT CONTRAST TECHNIQUE: Multidetector CT imaging of the head and cervical spine was performed following the standard protocol without intravenous contrast. Multiplanar CT image reconstructions of the cervical spine were also generated. RADIATION DOSE REDUCTION: This exam was performed according to the departmental dose-optimization program which includes automated exposure control, adjustment of the mA and/or kV according to patient size and/or use of iterative reconstruction technique. COMPARISON:  None Available. FINDINGS: CT HEAD FINDINGS Brain: No evidence of acute infarction, hemorrhage, hydrocephalus, extra-axial collection or mass lesion/mass effect. Vascular: Calcific atherosclerosis. Skull: No acute fracture. Sinuses/Orbits: No acute abnormality. Other: No mastoid effusions. CT CERVICAL SPINE FINDINGS Alignment: Similar alignment in comparison to the prior. No new sagittal subluxation. Skull base and vertebrae: Motion limited assessment without evidence of acute fracture. Extensive ankylosis throughout the cervical spine with skull base to T1 posterior fusion at C6-C7 ACDF. Osteopenia. Soft tissues and spinal canal: No prevertebral fluid or swelling. No visible canal hematoma. Disc levels: Severe multilevel bony degenerative change, not substantially changed. Upper chest: Further evaluated on same day CT chest/abdomen/pelvis. IMPRESSION: No evidence of acute abnormality intracranially or in the cervical spine. Electronically Signed   By: Feliberto Harts M.D.   On: 11/30/2023 15:32   CT Cervical Spine Wo Contrast Result Date: 11/30/2023 CLINICAL DATA:  Head trauma, moderate-severe; Neck trauma (Age >= 65y) EXAM: CT HEAD WITHOUT CONTRAST CT CERVICAL SPINE WITHOUT CONTRAST TECHNIQUE: Multidetector CT imaging of the head and cervical spine was performed following the standard protocol without intravenous contrast. Multiplanar CT image  reconstructions of the cervical spine were also generated. RADIATION DOSE REDUCTION: This exam was performed according to the departmental dose-optimization program which includes automated exposure control, adjustment of the mA and/or kV according to patient size and/or use of iterative reconstruction technique. COMPARISON:  None Available. FINDINGS: CT HEAD FINDINGS Brain: No evidence of acute infarction, hemorrhage, hydrocephalus, extra-axial collection or mass lesion/mass effect. Vascular: Calcific atherosclerosis. Skull: No acute fracture. Sinuses/Orbits: No acute abnormality. Other: No mastoid effusions. CT  CERVICAL SPINE FINDINGS Alignment: Similar alignment in comparison to the prior. No new sagittal subluxation. Skull base and vertebrae: Motion limited assessment without evidence of acute fracture. Extensive ankylosis throughout the cervical spine with skull base to T1 posterior fusion at C6-C7 ACDF. Osteopenia. Soft tissues and spinal canal: No prevertebral fluid or swelling. No visible canal hematoma. Disc levels: Severe multilevel bony degenerative change, not substantially changed. Upper chest: Further evaluated on same day CT chest/abdomen/pelvis. IMPRESSION: No evidence of acute abnormality intracranially or in the cervical spine. Electronically Signed   By: Feliberto Harts M.D.   On: 11/30/2023 15:32   DG Pelvis Portable Result Date: 11/30/2023 CLINICAL DATA:  Fall EXAM: PORTABLE PELVIS 1-2 VIEWS COMPARISON:  08/09/2023 FINDINGS: There is no evidence of pelvic fracture or diastasis. Mild degenerative changes of both hips without evidence of fracture or dislocation. No pelvic bone lesions are seen. IMPRESSION: Negative. Electronically Signed   By: Duanne Guess D.O.   On: 11/30/2023 14:59   DG Chest Portable 1 View Result Date: 11/30/2023 CLINICAL DATA:  Chest pain, fall EXAM: PORTABLE CHEST 1 VIEW COMPARISON:  10/26/2023 FINDINGS: Stable cardiomegaly. Aortic atherosclerosis. Subtle  airspace opacity in the right upper lobe. No pleural effusion or pneumothorax. IMPRESSION: Subtle airspace opacity in the right upper lobe, which may represent atelectasis or pneumonia. Electronically Signed   By: Duanne Guess D.O.   On: 11/30/2023 14:58     Medical Consultants:   None.   Subjective:    [name] relates no new complaints.  Objective:    Vitals:   12/01/23 1500 12/01/23 2120 12/02/23 0550 12/02/23 0748  BP: 138/65 120/70 114/86 113/73  Pulse: 88 97 90 98  Resp: 18 18 16 18   Temp: 98 F (36.7 C) 97.7 F (36.5 C) 97.7 F (36.5 C) 98 F (36.7 C)  TempSrc: Oral Oral Oral Oral  SpO2: 98%  98% 96%  Weight:      Height:       SpO2: 96 %   Intake/Output Summary (Last 24 hours) at 12/02/2023 1023 Last data filed at 12/02/2023 0600 Gross per 24 hour  Intake --  Output 1450 ml  Net -1450 ml   Filed Weights   11/30/23 2200  Weight: 107.4 kg    Exam: General exam: In no acute distress. Respiratory system: Good air movement and clear to auscultation. Cardiovascular system: S1 & S2 heard, RRR. No JVD. Gastrointestinal system: Abdomen is nondistended, soft and nontender.  Extremities: No pedal edema. Skin: No rashes, lesions or ulcers Psychiatry: Judgement and insight appear normal. Mood & affect appropriate.    Data Reviewed:    Labs: Basic Metabolic Panel: Recent Labs  Lab 11/30/23 1418 11/30/23 1442 12/01/23 0649 12/01/23 0650  NA 134* 136 137  --   K 4.3 4.5 3.9  --   CL 100 103 104  --   CO2 23  --  26  --   GLUCOSE 93 90 77  --   BUN 23 26* 20  --   CREATININE 1.04 0.90 1.36* 1.43*  CALCIUM 9.1  --  8.7*  --    GFR Estimated Creatinine Clearance: 48.9 mL/min (A) (by C-G formula based on SCr of 1.43 mg/dL (H)). Liver Function Tests: Recent Labs  Lab 11/30/23 1606  AST 58*  ALT 25  ALKPHOS 78  BILITOT 1.4*  PROT 6.8  ALBUMIN 3.0*   Recent Labs  Lab 11/30/23 1606  LIPASE 21   Recent Labs  Lab 11/30/23 1606   AMMONIA  18   Coagulation profile Recent Labs  Lab 11/30/23 1418  INR 1.0   COVID-19 Labs  No results for input(s): "DDIMER", "FERRITIN", "LDH", "CRP" in the last 72 hours.  Lab Results  Component Value Date   SARSCOV2NAA NEGATIVE 10/26/2023   SARSCOV2NAA NEGATIVE 11/03/2021   SARSCOV2NAA NEGATIVE 11/10/2019   SARSCOV2NAA NEGATIVE 10/06/2019    CBC: Recent Labs  Lab 11/30/23 1418 11/30/23 1442 12/01/23 0649  WBC 11.2*  --  7.4  HGB 14.2 15.6 12.3*  HCT 44.3 46.0 37.9*  MCV 99.6  --  97.7  PLT 253  --  231   Cardiac Enzymes: Recent Labs  Lab 11/30/23 1606 12/01/23 0649  CKTOTAL 1,065* 565*   BNP (last 3 results) No results for input(s): "PROBNP" in the last 8760 hours. CBG: No results for input(s): "GLUCAP" in the last 168 hours. D-Dimer: No results for input(s): "DDIMER" in the last 72 hours. Hgb A1c: No results for input(s): "HGBA1C" in the last 72 hours. Lipid Profile: No results for input(s): "CHOL", "HDL", "LDLCALC", "TRIG", "CHOLHDL", "LDLDIRECT" in the last 72 hours. Thyroid function studies: No results for input(s): "TSH", "T4TOTAL", "T3FREE", "THYROIDAB" in the last 72 hours.  Invalid input(s): "FREET3" Anemia work up: No results for input(s): "VITAMINB12", "FOLATE", "FERRITIN", "TIBC", "IRON", "RETICCTPCT" in the last 72 hours. Sepsis Labs: Recent Labs  Lab 11/30/23 1418 11/30/23 1441 12/01/23 0649  WBC 11.2*  --  7.4  LATICACIDVEN  --  1.9  --    Microbiology No results found for this or any previous visit (from the past 240 hours).   Medications:    ascorbic acid  250 mg Oral BID   aspirin EC  81 mg Oral Daily   buPROPion  100 mg Oral BID   cyanocobalamin  1,000 mcg Intramuscular Q28 days   diltiazem  120 mg Oral Daily   doxycycline  100 mg Oral Q12H   enoxaparin (LOVENOX) injection  50 mg Subcutaneous Q24H   ferrous sulfate  325 mg Oral BID WC   gabapentin  400 mg Oral TID   lidocaine  1 patch Transdermal Q24H   metoprolol  succinate  25 mg Oral Daily   pantoprazole  40 mg Oral Daily   potassium chloride SA  20 mEq Oral Daily   rOPINIRole  0.25 mg Oral BID   simvastatin  40 mg Oral QHS   sucralfate  1 g Oral BID   tamsulosin  0.4 mg Oral Daily   topiramate  25 mg Oral BID   Continuous Infusions:    LOS: 2 days   Marinda Elk  [facility]  12/02/2023, 10:23 AM

## 2023-12-02 NOTE — Progress Notes (Signed)
Seeing beth this week. Nodules resolved. No follow up needed   Thanks,  BLI  Josephine Igo, DO [facility] 12/02/2023 1:48 PM

## 2023-12-02 NOTE — Evaluation (Signed)
Occupational Therapy Evaluation Patient Details Name: [name] MRN: [id number] DOB: 12/10/1941 Today's Date: 12/02/2023   History of Present Illness 82 y.o. male presents to [facility] 11/30/23 w/ concern for recurrent falls w/ most recent fall 12/14. Trauma workout showed L 9th rib fx, L LE cellulitis, small L PTX, R upper lobe PNA. Pt also w/ rhabdomyolysis. PMH includes Afib, CVA, GIB, diastolic heart failure, chronic back pain w/ multiple back surgeries, memory deficits, ataxia   Clinical Impression   PTA patient reports independent with ADLs, mobility using cane, light IADls.  Admitted for above and presents with problem list below.  He completes bed mobility with supervision, transfers with min assist, functional mobility using RW with min guard and ADLs with up to mod assist.  He demonstrates decreased problem solving and safety awareness, he is oriented and follows commands with increased time but requires redirection at times.  Pt with decreased awareness of deficits and need for assist.  Based on performance today, believe patient will best benefit from continued OT services acutely and after dc at inpatient setting with <3hrs/day to optimize independence, safety with ADLs and mobility.  If pt declines SNF, would recommend 24/7 assist and HHOT services.        If plan is discharge home, recommend the following: A little help with walking and/or transfers;A lot of help with bathing/dressing/bathroom;Assistance with cooking/housework;Assist for transportation;Help with stairs or ramp for entrance;Direct supervision/assist for financial management;Direct supervision/assist for medications management    Functional Status Assessment  Patient has had a recent decline in their functional status and demonstrates the ability to make significant improvements in function in a reasonable and predictable amount of time.  Equipment Recommendations  BSC/3in1    Recommendations for Other Services        Precautions / Restrictions Precautions Precautions: Fall Precaution Comments: rib fxs Restrictions Weight Bearing Restrictions Per Provider Order: No      Mobility Bed Mobility Overal bed mobility: Needs Assistance Bed Mobility: Supine to Sit, Sit to Supine     Supine to sit: Supervision, HOB elevated     General bed mobility comments: increased time, HOB elevated but no physical assist required    Transfers Overall transfer level: Needs assistance Equipment used: Rolling walker (2 wheels) Transfers: Sit to/from Stand Sit to Stand: Min assist                  Balance Overall balance assessment: Needs assistance Sitting-balance support: No upper extremity supported, Feet supported Sitting balance-Leahy Scale: Good     Standing balance support: Bilateral upper extremity supported, No upper extremity supported, During functional activity Standing balance-Leahy Scale: Poor Standing balance comment: relies on RW dynamically, able to engage in ADLs with 0-2 hand support and min guard                           ADL either performed or assessed with clinical judgement   ADL Overall ADL's : Needs assistance/impaired     Grooming: Contact guard assist;Wash/dry hands;Standing           Upper Body Dressing : Sitting;Minimal assistance   Lower Body Dressing: Moderate assistance;Sit to/from stand Lower Body Dressing Details (indicate cue type and reason): requires assist to initate donning shoes, would required assist for socks; min assist in stading Toilet Transfer: Minimal assistance;Ambulation;Rolling walker (2 wheels)           Functional mobility during ADLs: Minimal assistance;Rolling walker (2 wheels)  Vision   Vision Assessment?: No apparent visual deficits     Perception         Praxis         Pertinent Vitals/Pain Pain Assessment Pain Assessment: 0-10 Pain Score: 6  Pain Location: arms/shoulders bilaterally Pain  Descriptors / Indicators: Discomfort Pain Intervention(s): Limited activity within patient's tolerance, Monitored during session, Repositioned     Extremity/Trunk Assessment Upper Extremity Assessment Upper Extremity Assessment: Generalized weakness (limited Bil shoulder flexion to 80-90* baseline)   Lower Extremity Assessment Lower Extremity Assessment: Defer to PT evaluation       Communication Communication Communication: No apparent difficulties (verbose)   Cognition Arousal: Alert Behavior During Therapy: WFL for tasks assessed/performed Overall Cognitive Status: No family/caregiver present to determine baseline cognitive functioning Area of Impairment: Awareness, Problem solving, Memory                     Memory: Decreased short-term memory     Awareness: Emergent Problem Solving: Slow processing, Requires verbal cues, Difficulty sequencing General Comments: patient oriented and following commands with increased time, patient requires redirection at times due to inappropriate statements.     General Comments  VSS on RA, bruising and scabs on B LE    Exercises     Shoulder Instructions      Home Living Family/patient expects to be discharged to:: Private residence Living Arrangements: Other relatives (sister) Available Help at Discharge: Family;Available PRN/intermittently Type of Home: House Home Access: Level entry     Home Layout: One level     Bathroom Shower/Tub: Producer, television/film/video: Handicapped height Bathroom Accessibility: Yes   Home Equipment: Grab bars - toilet;Grab bars - tub/shower;Wheelchair - Forensic psychologist (2 wheels);Cane - single point;Lift chair;Shower seat - built in          Prior Functioning/Environment Prior Level of Function : Independent/Modified Independent;History of Falls (last six months);Driving             Mobility Comments: modI w/ cane, reports at least 3 falls in the past 6  months. ADLs Comments: independent ADLs, drives, manages iADLs (report stopping his meds for the last 4 days PTA)        OT Problem List: Decreased strength;Decreased activity tolerance;Impaired balance (sitting and/or standing);Obesity;Decreased knowledge of use of DME or AE;Decreased knowledge of precautions;Decreased safety awareness;Decreased cognition      OT Treatment/Interventions: Self-care/ADL training;Therapeutic exercise;DME and/or AE instruction;Therapeutic activities;Patient/family education;Balance training    OT Goals(Current goals can be found in the care plan section) Acute Rehab OT Goals Patient Stated Goal: home OT Goal Formulation: With patient Time For Goal Achievement: 12/16/23 Potential to Achieve Goals: Good  OT Frequency: Min 1X/week    Co-evaluation              AM-PAC OT "6 Clicks" Daily Activity     Outcome Measure Help from another person eating meals?: None Help from another person taking care of personal grooming?: A Little Help from another person toileting, which includes using toliet, bedpan, or urinal?: A Little Help from another person bathing (including washing, rinsing, drying)?: A Little Help from another person to put on and taking off regular upper body clothing?: A Little Help from another person to put on and taking off regular lower body clothing?: A Lot 6 Click Score: 18   End of Session Equipment Utilized During Treatment: Rolling walker (2 wheels);Gait belt Nurse Communication: Mobility status  Activity Tolerance: Patient tolerated treatment well Patient left: in chair;with  call bell/phone within reach;with chair alarm set  OT Visit Diagnosis: Other abnormalities of gait and mobility (R26.89);Muscle weakness (generalized) (M62.81);History of falling (Z91.81)                Time: 0630-1601 OT Time Calculation (min): 36 min Charges:  OT General Charges $OT Visit: 1 Visit OT Evaluation $OT Eval Moderate Complexity: 1 Mod OT  Treatments $Self Care/Home Management : 8-22 mins  Barry Brunner, OT Acute Rehabilitation Services Office [phone]   Chancy Milroy 12/02/2023, 1:27 PM

## 2023-12-02 NOTE — TOC Initial Note (Addendum)
Transition of Care [facility]) - Initial/Assessment Note    Patient Details  Name: [name] MRN: [id number] Date of Birth: 1941/11/07  Transition of Care [facility]) CM/SW Contact:    Lorri Frederick, LCSW Phone Number: 12/02/2023, 2:16 PM  Clinical Narrative:      CSW met with pt regarding PT recommendation for SNF.  Pt is not agreeable to this and states he wants to DC home.  Pt from home with sister Steward Drone, permission given to speak with her.  No current home services.  Pt reports he has both walker and wheelchair at home currently.              1500: CSW spoke with pt sister Steward Drone, now also in room.  Discussed DC options and she is in agreement with plan for pt to DC home with [facility].  She lives in the home and can offer support.  Pt does want max HH services.  CSW discussed current flag that says pt has legal guardian and both of them confirm that Steward Drone is POA but pt does not have a legal guardian.    Expected Discharge Plan: Home w Home Health Services Barriers to Discharge: Continued Medical Work up   Patient Goals and CMS Choice Patient states their goals for this hospitalization and ongoing recovery are:: get home          Expected Discharge Plan and Services In-house Referral: Clinical Social Work   Post Acute Care Choice: Home Health Living arrangements for the past 2 months: Single Family Home                                      Prior Living Arrangements/Services Living arrangements for the past 2 months: Single Family Home Lives with:: Siblings (sister Steward Drone) Patient language and need for interpreter reviewed:: Yes Do you feel safe going back to the place where you live?: Yes      Need for Family Participation in Patient Care: Yes (Comment) Care giver support system in place?: Yes (comment) Current home services: Other (comment) (none) Criminal Activity/Legal Involvement Pertinent to Current Situation/Hospitalization: No - Comment as needed  Activities  of Daily Living      Permission Sought/Granted Permission sought to share information with : Family Supports Permission granted to share information with : Yes, Verbal Permission Granted  Share Information with NAME: sister Steward Drone           Emotional Assessment Appearance:: Appears stated age Attitude/Demeanor/Rapport: Engaged Affect (typically observed): Appropriate Orientation: : Oriented to Self, Oriented to Place, Oriented to Situation      Admission diagnosis:  Fall [W19.XXXA] Traumatic pneumothorax, initial encounter [S27.0XXA] Fall, initial encounter [id number]] Traumatic rhabdomyolysis, initial encounter (HCC) [T79.6XXA] Closed fracture of one rib of left side, initial encounter [S22.32XA] Community acquired pneumonia of right upper lobe of lung [J18.9] Patient Active Problem List   Diagnosis Date Noted   Rib fracture 11/30/2023   Pneumothorax 11/30/2023   CAP (community acquired pneumonia) 10/27/2023   Community acquired pneumonia of left lower lobe of lung 10/26/2023   Hypotension 11/04/2021   Laceration of forehead 11/04/2021   Chronic diastolic CHF (congestive heart failure) (HCC) 11/04/2021   Renal cyst 11/04/2021   Hyperkalemia 11/04/2021   Fall 11/03/2021   Palliative care by specialist    Goals of care, counseling/discussion    UTI (urinary tract infection) 11/10/2019   AF (paroxysmal atrial fibrillation) (HCC) 11/10/2019  Acute blood loss anemia 11/10/2019   Cellulitis 11/10/2019   AKI (acute kidney injury) (HCC) 11/10/2019   History of CVA (cerebrovascular accident) 11/10/2019   Abdominal pain 10/07/2019   Pressure injury of skin 10/07/2019   Atrial fibrillation with rapid ventricular response (HCC) 10/06/2019   Acute encephalopathy    GI bleeding 10/13/2018   Acute metabolic encephalopathy 10/13/2018   Acute renal failure (ARF) (HCC) 10/13/2018   S/P TAVR (transcatheter aortic valve replacement) 09/20/2018   Permanent atrial fibrillation (HCC)  09/20/2018   Acute on chronic diastolic CHF (congestive heart failure) (HCC) 09/18/2018   Heme positive stool 11/20/2017   Gait disorder 03/24/2013   Cervical post-laminectomy syndrome 03/26/2012   Depression 03/26/2012   Degenerative arthritis of shoulder region 03/26/2012   KNEE REPLACEMENT, BILATERAL, HX OF 06/04/2007   CELLULITIS/ABSCESS, ARM 05/05/2007   Anxiety state 04/08/2007   Chronic pain 04/08/2007   OSTEOARTHROSIS, GENERALIZED, MULTIPLE SITES 04/08/2007   SPONDYLOLISTHESIS 04/08/2007   INCONTINENCE, URGE 04/08/2007   HYPERCHOLESTEROLEMIA 03/07/2007   HYPERTENSION, BENIGN 03/07/2007   PCP:  Dois Davenport, MD Pharmacy:   [facility] [phone] [place] Otto, [place] - [address] [place] [address] Phone: [phone] Fax: [phone]  [facility] [place] [facility] [address] [place] [address] Phone: [phone] Fax: [phone]     Social Drivers of Health (SDOH) Social History: SDOH Screenings   Food Insecurity: Patient Unable To Answer (11/30/2023)  Housing: Patient Unable To Answer (11/30/2023)  Transportation Needs: Patient Unable To Answer (11/30/2023)  Utilities: Patient Unable To Answer (11/30/2023)  Social Connections: Unknown (04/30/2022)   Received from [facility]  Tobacco Use: Medium Risk (11/30/2023)   SDOH Interventions:     Readmission Risk Interventions    10/27/2023   11:13 AM  Readmission Risk Prevention Plan  Transportation Screening Complete  PCP or Specialist Appt within 5-7 Days Complete  Home Care Screening Complete  Medication Review (RN CM) Complete

## 2023-12-03 DIAGNOSIS — S2232XA Fracture of one rib, left side, initial encounter for closed fracture: Secondary | ICD-10-CM | POA: Diagnosis not present

## 2023-12-03 DIAGNOSIS — S270XXA Traumatic pneumothorax, initial encounter: Secondary | ICD-10-CM | POA: Diagnosis not present

## 2023-12-03 DIAGNOSIS — J189 Pneumonia, unspecified organism: Secondary | ICD-10-CM | POA: Diagnosis not present

## 2023-12-03 DIAGNOSIS — W19XXXA Unspecified fall, initial encounter: Secondary | ICD-10-CM | POA: Diagnosis not present

## 2023-12-03 LAB — CK: Total CK: 187 U/L (ref 49–397)

## 2023-12-03 MED ORDER — DOXYCYCLINE HYCLATE 100 MG PO TABS: 100.0000 mg | ORAL_TABLET | Freq: Two times a day (BID) | ORAL | 0 refills | Status: AC

## 2023-12-03 MED ORDER — SODIUM ZIRCONIUM CYCLOSILICATE 10 G PO PACK
10.0000 g | PACK | Freq: Once | ORAL | Status: AC
  Administered 2023-12-03: 10 g via ORAL
  Filled 2023-12-03: qty 1

## 2023-12-03 MED ORDER — GABAPENTIN 400 MG PO CAPS: 400.0000 mg | ORAL_CAPSULE | Freq: Three times a day (TID) | ORAL | 0 refills | Status: DC

## 2023-12-03 NOTE — Progress Notes (Signed)
Physical Therapy Treatment Patient Details Name: [name] MRN: [id number] DOB: [birth year] Today's Date: 12/03/2023   History of Present Illness 82 y.o. male presents to [facility] 11/30/23 w/ concern for recurrent falls w/ most recent fall 12/14. Trauma workout showed L 9th rib fx, L LE cellulitis, small L PTX, R upper lobe PNA. Pt also w/ rhabdomyolysis. PMH includes Afib, CVA, GIB, diastolic heart failure, chronic back pain w/ multiple back surgeries, memory deficits, ataxia    PT Comments  Pt received in bed and agreeable to therapy session. Pt able to don shoes in standing with RW support and ambulating 80 ft with CGA. Recommended use of walker for all mobility and provided fall prevention education. Pt declining SNF, so recommend HHPT to follow up to address balance, strengthening, gait.     If plan is discharge home, recommend the following: A little help with walking and/or transfers;Assist for transportation;Help with stairs or ramp for entrance;A little help with bathing/dressing/bathroom   Can travel by private vehicle     Yes  Equipment Recommendations  None recommended by PT    Recommendations for Other Services       Precautions / Restrictions Precautions Precautions: Fall Precaution Comments: rib fxs Restrictions Weight Bearing Restrictions Per Provider Order: No     Mobility  Bed Mobility Overal bed mobility: Modified Independent                  Transfers Overall transfer level: Needs assistance Equipment used: Rolling walker (2 wheels) Transfers: Sit to/from Stand Sit to Stand: Contact guard assist                Ambulation/Gait Ambulation/Gait assistance: Contact guard assist Gait Distance (Feet): 80 Feet Assistive device: Rolling walker (2 wheels) Gait Pattern/deviations: Step-through pattern, Shuffle Gait velocity: decreased Gait velocity interpretation: <1.31 ft/sec, indicative of household ambulator   General Gait Details: CGA for  stability, short shuffling steps   Stairs             Wheelchair Mobility     Tilt Bed    Modified Rankin (Stroke Patients Only)       Balance Overall balance assessment: History of Falls, Mild deficits observed, not formally tested, Needs assistance Sitting-balance support: No upper extremity supported, Feet supported Sitting balance-Leahy Scale: Good     Standing balance support: Bilateral upper extremity supported, During functional activity, Reliant on assistive device for balance Standing balance-Leahy Scale: Poor Standing balance comment: reliant on RW                            Cognition Arousal: Alert Behavior During Therapy: WFL for tasks assessed/performed Overall Cognitive Status: History of cognitive impairments - at baseline                                 General Comments: Cooperative this session        Exercises      General Comments        Pertinent Vitals/Pain Pain Assessment Pain Assessment: No/denies pain    Home Living                          Prior Function            PT Goals (current goals can now be found in the care plan section) Acute Rehab PT Goals Patient Stated  Goal: to go home PT Goal Formulation: With patient/family Time For Goal Achievement: 12/15/23 Potential to Achieve Goals: Good Progress towards PT goals: Progressing toward goals    Frequency    Min 1X/week      PT Plan      Co-evaluation              AM-PAC PT "6 Clicks" Mobility   Outcome Measure  Help needed turning from your back to your side while in a flat bed without using bedrails?: None Help needed moving from lying on your back to sitting on the side of a flat bed without using bedrails?: A Little Help needed moving to and from a bed to a chair (including a wheelchair)?: A Little Help needed standing up from a chair using your arms (e.g., wheelchair or bedside chair)?: A Little Help needed to  walk in hospital room?: A Little Help needed climbing 3-5 steps with a railing? : A Lot 6 Click Score: 18    End of Session Equipment Utilized During Treatment: Gait belt Activity Tolerance: Patient tolerated treatment well Patient left: in bed;with call bell/phone within reach;with bed alarm set;with family/visitor present Nurse Communication: Mobility status PT Visit Diagnosis: Unsteadiness on feet (R26.81);Repeated falls (R29.6);Muscle weakness (generalized) (M62.81)     Time: 1000-1020 PT Time Calculation (min) (ACUTE ONLY): 20 min  Charges:    $Therapeutic Activity: 8-22 mins PT General Charges $$ ACUTE PT VISIT: 1 Visit                     [name], PT, DPT Acute Rehabilitation Services Office [phone]    [name] 12/03/2023, 3:57 PM

## 2023-12-03 NOTE — TOC Transition Note (Signed)
Transition of Care [facility]) - Discharge Note   Patient Details  Name: [name] MRN: [id number] Date of Birth: 1941-04-02  [facility]) CM/SW Contact:  Epifanio Lesches, RN Phone Number: 12/03/2023, 11:37 AM   Clinical Narrative:    Patient will DC to: home Anticipated DC date: 12/03/2023 Family notified: yes, sister Transport by: car   Fall. Per MD patient ready for DC today . RN, patient, patient's sister notified of DC. Pt agreeable to home health services. Pt without provider preference. Referral made with [facility] and accepted. Declined BSC recommended by PT. Sister to provide transportation to home. Post hospital f/u noted on AVS.  RNCM will sign off for now as intervention is no longer needed. Please consult [place] again if new needs arise.      Barriers to Discharge: Continued Medical Work up   Patient Goals and CMS Choice Patient states their goals for this hospitalization and ongoing recovery are:: get home          Discharge Placement                       Discharge Plan and Services Additional resources added to the After Visit Summary for   In-house Referral: Clinical Social Work   Post Acute Care Choice: Home Health                    HH Arranged: PT, OT, RN [facility] Agency: [facility] Date [facility] Agency Contacted: 12/03/23 Time HH Agency Contacted: [id number] Representative spoke with at [facility] Agency: Tresa Endo  Social Drivers of Health (SDOH) Interventions SDOH Screenings   Food Insecurity: Patient Unable To Answer (11/30/2023)  Housing: Patient Unable To Answer (11/30/2023)  Transportation Needs: Patient Unable To Answer (11/30/2023)  Utilities: Patient Unable To Answer (11/30/2023)  Social Connections: Unknown (04/30/2022)   [facility]  Tobacco Use: Medium Risk (11/30/2023)     Readmission Risk Interventions    10/27/2023   11:13 AM  Readmission Risk Prevention Plan   Transportation Screening Complete  PCP or Specialist Appt within 5-7 Days Complete  Home Care Screening Complete  Medication Review (RN CM) Complete

## 2023-12-03 NOTE — Plan of Care (Signed)
  Problem: Education: Goal: Knowledge of General Education information will improve Description: Including pain rating scale, medication(s)/side effects and non-pharmacologic comfort measures Outcome: Adequate for Discharge   Problem: Health Behavior/Discharge Planning: Goal: Ability to manage health-related needs will improve Outcome: Adequate for Discharge   Problem: Clinical Measurements: Goal: Ability to maintain clinical measurements within normal limits will improve Outcome: Adequate for Discharge Goal: Will remain free from infection Outcome: Adequate for Discharge Goal: Diagnostic test results will improve Outcome: Adequate for Discharge Goal: Respiratory complications will improve Outcome: Adequate for Discharge Goal: Cardiovascular complication will be avoided Outcome: Adequate for Discharge   Problem: Activity: Goal: Risk for activity intolerance will decrease Outcome: Adequate for Discharge   Problem: Nutrition: Goal: Adequate nutrition will be maintained Outcome: Adequate for Discharge   Problem: Coping: Goal: Level of anxiety will decrease Outcome: Adequate for Discharge   Problem: Elimination: Goal: Will not experience complications related to bowel motility Outcome: Adequate for Discharge Goal: Will not experience complications related to urinary retention Outcome: Adequate for Discharge   Problem: Pain Management: Goal: General experience of comfort will improve Outcome: Adequate for Discharge   Problem: Safety: Goal: Ability to remain free from injury will improve Outcome: Adequate for Discharge   Problem: Skin Integrity: Goal: Risk for impaired skin integrity will decrease Outcome: Adequate for Discharge   Problem: Acute Rehab PT Goals(only PT should resolve) Goal: Pt Will Go Supine/Side To Sit Outcome: Adequate for Discharge Goal: Patient Will Transfer Sit To/From Stand Outcome: Adequate for Discharge Goal: Pt Will Transfer Bed To  Chair/Chair To Bed Outcome: Adequate for Discharge Goal: Pt Will Ambulate Outcome: Adequate for Discharge   Problem: Acute Rehab OT Goals (only OT should resolve) Goal: Pt. Will Perform Grooming Outcome: Adequate for Discharge Goal: Pt. Will Perform Lower Body Dressing Outcome: Adequate for Discharge Goal: Pt. Will Transfer To Toilet Outcome: Adequate for Discharge Goal: Pt. Will Perform Toileting-Clothing Manipulation Outcome: Adequate for Discharge Goal: Pt. Will Perform Tub/Shower Transfer Outcome: Adequate for Discharge Goal: OT Additional ADL Goal #1 Outcome: Adequate for Discharge

## 2023-12-03 NOTE — Progress Notes (Signed)
Explained discharge instructions to patient. Reviewed follow up appointment and next medication administration times. Also reviewed education. Patient verbalized having an understanding for instructions given. Removed IV. No other needs verbalized. Will transport downstairs for discharge.

## 2023-12-03 NOTE — Discharge Summary (Signed)
Physician Discharge Summary  [name]:[id number] DOB: October 20, 1941 DOA: 11/30/2023  PCP: Dois Davenport, MD  Admit date: 11/30/2023 Discharge date: 12/03/2023  Admitted From: Home Disposition:  Home  Recommendations for Outpatient Follow-up:  Follow up with PCP in 1-2 weeks Please obtain BMP/CBC in one week   Home Health:Yes Equipment/Devices:None  Discharge Condition:STable CODE STATUS:Full Diet recommendation: Heart Healthy   Brief/Interim Summary: 82 y.o. male past medical history of essential hypertension, CAD aortic stenosis status post TAVR's, paroxysmal atrial fibrillation on Eliquis, chronic back pain with multiple back surgeries and recurrent falls brought in to the hospital for altered mental status that started 2 days prior to admission he was found on the floor EMS was called found to have facial ecchymosis and hematoma bilateral lower extremity with cellulitic changes white count of 11 CT scan of the head and C-spine were unremarkable.  CT of the chest and abdomen showed acute displaced ninth posterolateral rib fracture with trace pneumothorax with patchy groundglass opacity in the right lung   Discharge Diagnoses:  Principal Problem:   Fall Active Problems:   HYPERTENSION, BENIGN   Gait disorder   S/P TAVR (transcatheter aortic valve replacement)   AF (paroxysmal atrial fibrillation) (HCC)   Cellulitis   History of CVA (cerebrovascular accident)   Goals of care, counseling/discussion   CAP (community acquired pneumonia)   Rib fracture   Pneumothorax  Right upper lobe pneumonia: Was started on admission IV Rocephin and azithromycin. He remained afebrile de-escalated to oral Doxy which should continue as an outpatient.  Acute left ninth rib displaced fracture with trace pneumothorax: Secondary to recurrent falls. Respiratory status remained stable, chest x-ray was repeated pneumothorax had resolved. PT OT was consulted and recommended skilled nursing  facility upon discharge. Patient and family refused he will go home with home health PT.  Facial ecchymosis: Likely due to recurrent fall, CT maxillofacial showed no fracture but does show soft tissue swelling in hematoma.  Bilateral lower extremity swelling/left lower extremity cellulitis: He was started on doxycycline which continue as an outpatient.  Recurrent falls: Worsening over the last several months as per family. Skeletal survey showed no fracture. PT evaluated the patient recommended skilled nursing facility which the patient refused. He will go home with home health. Due to his recurrent falls he is no longer on anticoagulation and this was discontinued. I am wondering if some of his falls his polypharmacy may be contributing to it. Topamax and Robaxin were discontinued. Neurontin was resumed at a lower dose and he will continue Wellbutrin. Will try to minimize narcotics as an outpatient.  Traumatic rhabdomyolysis: Improved with oral hydration now resolved.  Acute kidney injury: Likely prerenal azotemia resolved with IV fluids.  Chronic diastolic dysfunction/essential hypertension: He was continued on Toprol and Cardizem. While in the hospital his Lasix was held he will resume it as an outpatient.  Paroxysmal atrial fibrillation: With a chads Vascor of 7. He was continue on Toprol and Cardizem. He is no longer a candidate for anticoagulation due to multiple recurrent falls.  History of CVA: Continue statin not a candidate for anticoagulation.  Chronic back pain: Continue Wellbutrin Neurontin and narcotics as an outpatient. It is to note that his polypharmacy may have been contributing to his recurrent falls. Neurontin dose was lowered, Robaxin was discontinued.  Macrocytic anemia: Continue vitamin B12 supplementation.  BPH: Continue Flomax.  Discharge Instructions  Discharge Instructions     Diet - low sodium heart healthy   Complete by: As directed  Increase activity slowly   Complete by: As directed       Allergies as of 12/03/2023       Reactions   Lorazepam Other (See Comments)   Delirium   Duloxetine Other (See Comments)   Increased depression, irritability   Pregabalin Swelling, Other (See Comments)   Flashbacks, nightmares, aggression   Zanaflex [tizanidine Hcl] Swelling, Anxiety        Medication List     STOP taking these medications    apixaban 5 MG Tabs tablet Commonly known as: Eliquis   ascorbic acid 250 MG tablet Commonly known as: VITAMIN C   gabapentin 800 MG tablet Commonly known as: NEURONTIN Replaced by: gabapentin 400 MG capsule   methocarbamol 500 MG tablet Commonly known as: ROBAXIN   topiramate 25 MG tablet Commonly known as: TOPAMAX       TAKE these medications    aspirin EC 81 MG tablet Take 81 mg by mouth daily.   buPROPion 100 MG tablet Commonly known as: WELLBUTRIN Take 100 mg by mouth 2 (two) times daily.   cyanocobalamin 1000 MCG/ML injection Commonly known as: VITAMIN B12 Inject 1 mL into the muscle every 28 (twenty-eight) days.   diltiazem 120 MG 24 hr capsule Commonly known as: CARDIZEM CD Take 1 capsule (120 mg total) by mouth daily.   doxycycline 100 MG tablet Commonly known as: VIBRA-TABS Take 1 tablet (100 mg total) by mouth every 12 (twelve) hours for 3 days.   ferrous sulfate 325 (65 FE) MG tablet Take 325 mg by mouth daily with breakfast.   furosemide 20 MG tablet Commonly known as: LASIX Take 20 mg by mouth daily. May take one additional tablet as needed for leg swelling.   gabapentin 400 MG capsule Commonly known as: NEURONTIN Take 1 capsule (400 mg total) by mouth 3 (three) times daily. Replaces: gabapentin 800 MG tablet   metoprolol succinate 25 MG 24 hr tablet Commonly known as: TOPROL-XL Take 25 mg by mouth daily.   oxyCODONE-acetaminophen 10-325 MG tablet Commonly known as: PERCOCET Take 1 tablet by mouth 5 (five) times daily as  needed for pain.   polyethylene glycol 17 g packet Commonly known as: MIRALAX / GLYCOLAX Take 17 g by mouth daily as needed for mild constipation.   potassium chloride SA 20 MEQ tablet Commonly known as: KLOR-CON M Take 1 tablet (20 mEq total) by mouth daily. What changed: when to take this   rOPINIRole 0.25 MG tablet Commonly known as: REQUIP Take 0.25 mg by mouth 2 (two) times daily.   silver sulfADIAZINE 1 % cream Commonly known as: SILVADENE Apply 1 Application topically.   simvastatin 40 MG tablet Commonly known as: ZOCOR Take 40 mg by mouth at bedtime.   tamsulosin 0.4 MG Caps capsule Commonly known as: FLOMAX Take 0.8 mg by mouth daily.        Allergies  Allergen Reactions   Lorazepam Other (See Comments)    Delirium   Duloxetine Other (See Comments)    Increased depression, irritability   Pregabalin Swelling and Other (See Comments)    Flashbacks, nightmares, aggression   Zanaflex [Tizanidine Hcl] Swelling and Anxiety    Consultations: None   Procedures/Studies: DG CHEST PORT 1 VIEW Result Date: 12/01/2023 CLINICAL DATA:  [id number] Pneumothorax [id number] EXAM: PORTABLE CHEST 1 VIEW COMPARISON:  Chest radiograph from one day prior. FINDINGS: Partially visualized surgical hardware from ACDF and posterior spinal fusion in the lower cervical spine. TAVR in place. Bilateral posterior spinal fusion hardware in  the lower thoracic spine again noted. Stable cardiomediastinal silhouette with mild cardiomegaly. No pneumothorax. No pleural effusion. Borderline mild pulmonary edema, similar. IMPRESSION: 1. No pneumothorax. 2. Borderline mild congestive heart failure, similar. Electronically Signed   By: Delbert Phenix M.D.   On: 12/01/2023 08:16   CT CHEST WO CONTRAST Result Date: 11/30/2023 CLINICAL DATA:  Lung nodule EXAM: CT CHEST WITHOUT CONTRAST TECHNIQUE: Multidetector CT imaging of the chest was performed following the standard protocol without IV contrast. RADIATION  DOSE REDUCTION: This exam was performed according to the departmental dose-optimization program which includes automated exposure control, adjustment of the mA and/or kV according to patient size and/or use of iterative reconstruction technique. COMPARISON:  CT angiogram chest 10/26/2023. CT chest abdomen and pelvis 05/14/2023. chest CT 11/30/2023. FINDINGS: Cardiovascular: Heart and aorta are normal in size. Aortic valve replacement is present. There are atherosclerotic calcifications of the aorta. There is a trace pericardial effusion. Mediastinum/Nodes: No enlarged mediastinal or axillary lymph nodes. Thyroid gland, trachea, and esophagus demonstrate no significant findings. Lungs/Pleura: Are 2 micro nodules measuring 3 mm or less in the left lower lobe which have decreased in size compared to 05/14/2023. There is a nodular density in the left lower lobe measuring 9 mm image 4/65 which is new from 05/14/2023. This region was obscured on CT from 10/26/2023 secondary to airspace disease. There is no focal lung infiltrate, pleural effusion or pneumothorax. Upper Abdomen: No acute abnormality. There surgical changes in the stomach. Musculoskeletal: There is bilateral gynecomastia, unchanged. No acute fractures are seen. Cervical and lower thoracic spinal fusion hardware is present. Degenerative changes affect spine. IMPRESSION: 1. Nodular densities left lower lobe; several are decreasing in size and 1 is new. Follow-up future study from 11/30/2023 is available and these nodular densities have resolved compatible with infectious/inflammatory etiologies. 2. Trace pericardial effusion. Aortic Atherosclerosis (ICD10-I70.0). Electronically Signed   By: Darliss Cheney M.D.   On: 11/30/2023 21:12   DG CHEST PORT 1 VIEW Result Date: 11/30/2023 CLINICAL DATA:  Follow-up pneumothorax EXAM: PORTABLE CHEST 1 VIEW COMPARISON:  None Available. FINDINGS: Evaluation is compromised by patient positioning. No definite pneumothorax.  No focal consolidation or pleural effusion. Cardiomegaly and pulmonary vascular congestion. Spinal fusion hardware. The left ninth rib fracture is not well demonstrated radiographically. IMPRESSION: No definite pneumothorax. Electronically Signed   By: Minerva Fester M.D.   On: 11/30/2023 21:05   CT MAXILLOFACIAL WO CONTRAST Result Date: 11/30/2023 CLINICAL DATA:  Facial trauma, blunt.  Fall. EXAM: CT MAXILLOFACIAL WITHOUT CONTRAST TECHNIQUE: Multidetector CT imaging of the maxillofacial structures was performed. Multiplanar CT image reconstructions were also generated. RADIATION DOSE REDUCTION: This exam was performed according to the departmental dose-optimization program which includes automated exposure control, adjustment of the mA and/or kV according to patient size and/or use of iterative reconstruction technique. COMPARISON:  Same-day head and cervical spine CT. FINDINGS: Osseous: No fracture or mandibular dislocation. No destructive process. Orbits: Negative. No traumatic or inflammatory finding. Sinuses: Clear. Soft tissues: Mild soft tissue swelling along the right cheek. Limited intracranial: No significant or unexpected finding. IMPRESSION: Mild soft tissue swelling along the right cheek. No acute facial bone fracture. Electronically Signed   By: Orvan Falconer M.D.   On: 11/30/2023 20:28   CT CHEST ABDOMEN PELVIS W CONTRAST Result Date: 11/30/2023 CLINICAL DATA:  Ground level fall on blood thinners altered EXAM: CT CHEST, ABDOMEN, AND PELVIS WITH CONTRAST TECHNIQUE: Multidetector CT imaging of the chest, abdomen and pelvis was performed following the standard protocol during bolus administration  of intravenous contrast. RADIATION DOSE REDUCTION: This exam was performed according to the departmental dose-optimization program which includes automated exposure control, adjustment of the mA and/or kV according to patient size and/or use of iterative reconstruction technique. CONTRAST:  75mL  OMNIPAQUE IOHEXOL 350 MG/ML SOLN COMPARISON:  Radiographs 11/30/2023, chest CT 10/26/2023, CT chest abdomen pelvis 05/14/2023 FINDINGS: CT CHEST FINDINGS Cardiovascular: Nonaneurysmal aorta. Mild atherosclerosis. No dissection. Normal contour. Aortic valve prosthesis. No pericardial effusion Mediastinum/Nodes: Patent trachea. No thyroid mass. No suspicious lymph nodes. Esophagus within normal limits. Lungs/Pleura: Clearing of previously noted left lower lobe pneumonia. Interim development of patchy ground-glass disease in the right upper lobe. Tiny air within the pleural space at the left apex. Small pneumothorax at the left anterior lung base. Musculoskeletal: Advanced multilevel degenerative changes. Posterior spinal hardware T10 and T11 with osseous fusion. Acute displaced left ninth posterolateral rib fracture. Multiple old appearing bilateral rib fractures. Gynecomastia. CT ABDOMEN PELVIS FINDINGS Hepatobiliary: No focal liver abnormality is seen. No gallstones, gallbladder wall thickening, or biliary dilatation. Pancreas: Unremarkable. No pancreatic ductal dilatation or surrounding inflammatory changes. Spleen: Normal in size without focal abnormality. Adrenals/Urinary Tract: Adrenal glands are normal. Small nonobstructing right kidney stone. Bilateral renal cysts for which no imaging follow-up is recommended. No hydronephrosis. Multiple small bladder diverticula. Bilateral renal cortical atrophy. Stomach/Bowel: Postsurgical changes of the stomach. No dilated small bowel. No acute bowel wall thickening. Negative appendix Vascular/Lymphatic: Moderate aortic atherosclerosis. No aneurysm. No suspicious lymph nodes. Reproductive: Negative prostate Other: Negative for free air or free fluid. Small fat containing inguinal hernias.Moderate fat containing supraumbilical ventral hernia. Small fat containing umbilical hernia. Musculoskeletal: Multilevel lumbar degenerative change. No acute osseous abnormality.  IMPRESSION: 1. Acute displaced left ninth posterolateral rib fracture. Trace left pneumothorax. 2. Interim development of patchy ground-glass disease in the right upper lobe, suspicious for pneumonia. Clearing of previously noted left lower lobe airspace disease. 3. No CT evidence for acute intra-abdominal or pelvic abnormality. 4. Small nonobstructing right kidney stone. Multiple small bladder diverticula. 5. Aortic atherosclerosis. Aortic Atherosclerosis (ICD10-I70.0). Electronically Signed   By: Jasmine Pang M.D.   On: 11/30/2023 16:42   CT Head Wo Contrast Result Date: 11/30/2023 CLINICAL DATA:  Head trauma, moderate-severe; Neck trauma (Age >= 65y) EXAM: CT HEAD WITHOUT CONTRAST CT CERVICAL SPINE WITHOUT CONTRAST TECHNIQUE: Multidetector CT imaging of the head and cervical spine was performed following the standard protocol without intravenous contrast. Multiplanar CT image reconstructions of the cervical spine were also generated. RADIATION DOSE REDUCTION: This exam was performed according to the departmental dose-optimization program which includes automated exposure control, adjustment of the mA and/or kV according to patient size and/or use of iterative reconstruction technique. COMPARISON:  None Available. FINDINGS: CT HEAD FINDINGS Brain: No evidence of acute infarction, hemorrhage, hydrocephalus, extra-axial collection or mass lesion/mass effect. Vascular: Calcific atherosclerosis. Skull: No acute fracture. Sinuses/Orbits: No acute abnormality. Other: No mastoid effusions. CT CERVICAL SPINE FINDINGS Alignment: Similar alignment in comparison to the prior. No new sagittal subluxation. Skull base and vertebrae: Motion limited assessment without evidence of acute fracture. Extensive ankylosis throughout the cervical spine with skull base to T1 posterior fusion at C6-C7 ACDF. Osteopenia. Soft tissues and spinal canal: No prevertebral fluid or swelling. No visible canal hematoma. Disc levels: Severe  multilevel bony degenerative change, not substantially changed. Upper chest: Further evaluated on same day CT chest/abdomen/pelvis. IMPRESSION: No evidence of acute abnormality intracranially or in the cervical spine. Electronically Signed   By: Feliberto Harts M.D.   On: 11/30/2023 15:32  CT Cervical Spine Wo Contrast Result Date: 11/30/2023 CLINICAL DATA:  Head trauma, moderate-severe; Neck trauma (Age >= 65y) EXAM: CT HEAD WITHOUT CONTRAST CT CERVICAL SPINE WITHOUT CONTRAST TECHNIQUE: Multidetector CT imaging of the head and cervical spine was performed following the standard protocol without intravenous contrast. Multiplanar CT image reconstructions of the cervical spine were also generated. RADIATION DOSE REDUCTION: This exam was performed according to the departmental dose-optimization program which includes automated exposure control, adjustment of the mA and/or kV according to patient size and/or use of iterative reconstruction technique. COMPARISON:  None Available. FINDINGS: CT HEAD FINDINGS Brain: No evidence of acute infarction, hemorrhage, hydrocephalus, extra-axial collection or mass lesion/mass effect. Vascular: Calcific atherosclerosis. Skull: No acute fracture. Sinuses/Orbits: No acute abnormality. Other: No mastoid effusions. CT CERVICAL SPINE FINDINGS Alignment: Similar alignment in comparison to the prior. No new sagittal subluxation. Skull base and vertebrae: Motion limited assessment without evidence of acute fracture. Extensive ankylosis throughout the cervical spine with skull base to T1 posterior fusion at C6-C7 ACDF. Osteopenia. Soft tissues and spinal canal: No prevertebral fluid or swelling. No visible canal hematoma. Disc levels: Severe multilevel bony degenerative change, not substantially changed. Upper chest: Further evaluated on same day CT chest/abdomen/pelvis. IMPRESSION: No evidence of acute abnormality intracranially or in the cervical spine. Electronically Signed   By:  Feliberto Harts M.D.   On: 11/30/2023 15:32   DG Pelvis Portable Result Date: 11/30/2023 CLINICAL DATA:  Fall EXAM: PORTABLE PELVIS 1-2 VIEWS COMPARISON:  08/09/2023 FINDINGS: There is no evidence of pelvic fracture or diastasis. Mild degenerative changes of both hips without evidence of fracture or dislocation. No pelvic bone lesions are seen. IMPRESSION: Negative. Electronically Signed   By: Duanne Guess D.O.   On: 11/30/2023 14:59   DG Chest Portable 1 View Result Date: 11/30/2023 CLINICAL DATA:  Chest pain, fall EXAM: PORTABLE CHEST 1 VIEW COMPARISON:  10/26/2023 FINDINGS: Stable cardiomegaly. Aortic atherosclerosis. Subtle airspace opacity in the right upper lobe. No pleural effusion or pneumothorax. IMPRESSION: Subtle airspace opacity in the right upper lobe, which may represent atelectasis or pneumonia. Electronically Signed   By: Duanne Guess D.O.   On: 11/30/2023 14:58   (Echo, Carotid, EGD, Colonoscopy, ERCP)    Subjective: No complaint  Discharge Exam: Vitals:   12/02/23 2352 12/03/23 0816  BP: 97/65 114/70  Pulse:  79  Resp:  18  Temp: 98.6 F (37 C) 98.5 F (36.9 C)  SpO2: 97% 95%   Vitals:   12/02/23 1531 12/02/23 2027 12/02/23 2352 12/03/23 0816  BP: 115/69 (!) 95/58 97/65 114/70  Pulse: 78 81  79  Resp: 18 20  18   Temp: 98 F (36.7 C) 98.5 F (36.9 C) 98.6 F (37 C) 98.5 F (36.9 C)  TempSrc: Oral Oral Oral Oral  SpO2: 98% 100% 97% 95%  Weight:      Height:        General: Pt is alert, awake, not in acute distress Cardiovascular: RRR, S1/S2 +, no rubs, no gallops Respiratory: CTA bilaterally, no wheezing, no rhonchi Abdominal: Soft, NT, ND, bowel sounds + Extremities: no edema, no cyanosis    The results of significant diagnostics from this hospitalization (including imaging, microbiology, ancillary and laboratory) are listed below for reference.     Microbiology: No results found for this or any previous visit (from the past 240  hours).   Labs: BNP (last 3 results) Recent Labs    04/29/23 1511  BNP 190.3*   Basic Metabolic Panel: Recent Labs  Lab  11/30/23 1418 11/30/23 1442 12/01/23 0649 12/01/23 0650 12/02/23 1124  NA 134* 136 137  --  137  K 4.3 4.5 3.9  --  5.1  CL 100 103 104  --  102  CO2 23  --  26  --  29  GLUCOSE 93 90 77  --  86  BUN 23 26* 20  --  22  CREATININE 1.04 0.90 1.36* 1.43* 1.14  CALCIUM 9.1  --  8.7*  --  9.2   Liver Function Tests: Recent Labs  Lab 11/30/23 1606  AST 58*  ALT 25  ALKPHOS 78  BILITOT 1.4*  PROT 6.8  ALBUMIN 3.0*   Recent Labs  Lab 11/30/23 1606  LIPASE 21   Recent Labs  Lab 11/30/23 1606  AMMONIA 18   CBC: Recent Labs  Lab 11/30/23 1418 11/30/23 1442 12/01/23 0649  WBC 11.2*  --  7.4  HGB 14.2 15.6 12.3*  HCT 44.3 46.0 37.9*  MCV 99.6  --  97.7  PLT 253  --  231   Cardiac Enzymes: Recent Labs  Lab 11/30/23 1606 12/01/23 0649 12/03/23 0550  CKTOTAL 1,065* 565* 187   BNP: Invalid input(s): "POCBNP" CBG: No results for input(s): "GLUCAP" in the last 168 hours. D-Dimer No results for input(s): "DDIMER" in the last 72 hours. Hgb A1c No results for input(s): "HGBA1C" in the last 72 hours. Lipid Profile No results for input(s): "CHOL", "HDL", "LDLCALC", "TRIG", "CHOLHDL", "LDLDIRECT" in the last 72 hours. Thyroid function studies No results for input(s): "TSH", "T4TOTAL", "T3FREE", "THYROIDAB" in the last 72 hours.  Invalid input(s): "FREET3" Anemia work up No results for input(s): "VITAMINB12", "FOLATE", "FERRITIN", "TIBC", "IRON", "RETICCTPCT" in the last 72 hours. Urinalysis    Component Value Date/Time   COLORURINE YELLOW 11/30/2023 1606   APPEARANCEUR CLEAR 11/30/2023 1606   LABSPEC 1.041 (H) 11/30/2023 1606   PHURINE 6.0 11/30/2023 1606   GLUCOSEU NEGATIVE 11/30/2023 1606   HGBUR NEGATIVE 11/30/2023 1606   HGBUR negative 04/08/2007 1327   BILIRUBINUR NEGATIVE 11/30/2023 1606   KETONESUR 20 (A) 11/30/2023 1606    PROTEINUR NEGATIVE 11/30/2023 1606   UROBILINOGEN 0.2 04/24/2010 1617   NITRITE NEGATIVE 11/30/2023 1606   LEUKOCYTESUR NEGATIVE 11/30/2023 1606   Sepsis Labs Recent Labs  Lab 11/30/23 1418 12/01/23 0649  WBC 11.2* 7.4   Microbiology No results found for this or any previous visit (from the past 240 hours).   Time coordinating discharge: Over 35 minutes  SIGNED:   Marinda Elk, MD  [facility] 12/03/2023, 9:20 AM Pager   If 7PM-7AM, please contact night-coverage [email or web address] Password [email or web address]

## 2023-12-03 NOTE — Care Management Important Message (Signed)
Important Message  Patient Details  Name: [name] MRN: [id number] Date of Birth: 08/26/1941   Important Message Given:  Yes - Medicare IM    Patient left prior to IM delivery will mail a copy to patient home address.    [name] 12/03/2023, 4:15 PM

## 2023-12-03 NOTE — Care Management Important Message (Signed)
Important Message  Patient Details  Name: [name] MRN: [id number] Date of Birth: [birth year]   Important Message Given:  Yes - Medicare IM     [name] 12/03/2023, 3:49 PM

## 2023-12-03 NOTE — Discharge Planning (Signed)
Patient alert. IV removed. Discharge teaching given by Orvan Seen, RN. See note. Patient will be transported via personal vehicle by his sister.

## 2023-12-05 ENCOUNTER — Encounter: Payer: Self-pay | Admitting: Primary Care

## 2023-12-05 ENCOUNTER — Ambulatory Visit: Payer: Medicare Other | Admitting: Primary Care

## 2024-01-06 NOTE — Progress Notes (Deleted)
[facility] Initial Consultation   Referring Provider Dois Davenport, MD [address] [place],  [place] [address]  Primary Care Provider Dois Davenport, MD  Patient Profile: [name] is a 83 y.o. male with a past medical history noteworthy atrial fibrillation, severe aortic stenosis status post TAVR in 2019, chronic pain syndrome, arthritis for who is seen in consultation in the [facility] at the request of Dr. Hal Hope for evaluation and management of the problem(s) noted below.  Problem List: Solid food dysphagia Chronic cough   History of Present Illness   [name] is a 83 y.o. male with a history of ***.   Last colonoscopy: *** Last endoscopy: ***  Last Abd CT/CTE/MRE: ***  GI Review of Symptoms Significant for {GIROS:50592}. Otherwise negative.  General Review of Systems  Review of systems is significant for the pertinent positives and negatives as listed per the HPI.  Full ROS is otherwise negative.  Past Medical History   Past Medical History:  Diagnosis Date   Cancer [facility])    Carpal tunnel syndrome    Cervical spondylosis with myelopathy    Chronic pain syndrome    Depression    Difficult intubation    needs smaller tube; awake oral fiberoptic scope 8.0 ETT 03/30/08   Dysphagia, pharyngeal phase    Hemorrhage of gastrointestinal tract, unspecified    History of cardiac cath    a. 03/2018 Cath: LAD and LCX with mild luminal irregularities.  No significant disease.   Iron deficiency anemia, unspecified    Lumbosacral spondylosis without myelopathy    Muscle weakness (generalized)    Other and unspecified disc disorder of cervical region    Other and unspecified hyperlipidemia    Patent foramen ovale    a. 05/2018 Echo: post-op TAVR--> + bublble study w/ L->R shunt.   Permanent atrial fibrillation (HCC)    a.  Diagnosed in the spring 2019.  Had rapid atrial fibrillation following TAVR and has been rate  controlled with beta-blocker.  CHA2DS2VASc equals 7.  Supposed to be on Eliquis.   Primary localized osteoarthrosis, lower leg    Primary localized osteoarthrosis, shoulder region    Reflux esophagitis    Severe aortic stenosis    a. mild-mod AS by 01/2016 TTE, restricted AV opening with mod AR 01/2016 TEE; b. 10/2017 Echo: EF 60-65%, sev Ca2+ AoV, mean grad ; c. 05/2018 s/p TAVR ([facility]); d. 06/2018 Echo: EF 55-60%, well-seated prosth AoV, peak velocity 156cm/s. AoV gradient .   Spinal stenosis, unspecified region other than cervical    Stroke (HCC)    01/2016   Syncope and collapse    Unspecified arthropathy, lower leg    Unspecified constipation    Unspecified essential hypertension    Unspecified glaucoma(365.9)      Past Surgical History   Past Surgical History:  Procedure Laterality Date   BILATERAL CATARACT SURGERY  2009   DR GROAT    BIOPSY  10/14/2018   Procedure: BIOPSY;  Surgeon: Graylin Shiver, MD;  Location: [facility] ENDOSCOPY;  Service: Endoscopy;;   CARDIAC VALVE REPLACEMENT  05/2018   CERVIACAL SPINE (4X)     DR MARK ROY    COLONOSCOPY  2007   DR HENSEL    COLONOSCOPY WITH PROPOFOL N/A 11/20/2017   Procedure: COLONOSCOPY WITH PROPOFOL;  Surgeon: Charlott Rakes, MD;  Location: [facility] ENDOSCOPY;  Service: Endoscopy;  Laterality: N/A;   ESOPHAGOGASTRODUODENOSCOPY (EGD) WITH PROPOFOL N/A 10/14/2018   Procedure: ESOPHAGOGASTRODUODENOSCOPY (EGD) WITH PROPOFOL;  Surgeon: Graylin Shiver, MD;  Location: [facility] ENDOSCOPY;  Service: Endoscopy;  Laterality: N/A;   JOINT REPLACEMENT     both knees   LEFT KNEE REPLACEMENT     DR ALUSIO   LEFT TRANSVERSE CARPAL LIGAMENT  01/06/2008   ROTATOR CUFF LEFT SHOULDER  2001   DR MURPHY    SHOULDER OPEN ROTATOR CUFF REPAIR  2006   DR [facility]   SPINE SURGERY     neck fusion     Allergies and Medications   Allergies  Allergen Reactions   Lorazepam Other (See Comments)    Delirium   Duloxetine Other (See Comments)    Increased  depression, irritability   Pregabalin Swelling and Other (See Comments)    Flashbacks, nightmares, aggression   Zanaflex [Tizanidine Hcl] Swelling and Anxiety    @MEDSTODAY @  Family History   Family History  Problem Relation Age of Onset   Cancer Mother 56       cervical cancer   Cancer Father 53       lung  cancer     Social History   Social History   Tobacco Use   Smoking status: Former    Current packs/day: 0.00    Types: Cigarettes    Quit date: 08/29/1985    Years since quitting: 38.3   Smokeless tobacco: Former    Quit date: 02/12/1985  Vaping Use   Vaping status: Never Used  Substance Use Topics   Alcohol use: No   Drug use: No   Ostin reports that he quit smoking about 38 years ago. His smoking use included cigarettes. He quit smokeless tobacco use about 38 years ago. He reports that he does not drink alcohol and does not use drugs.  Vital Signs and Physical Examination  There were no vitals filed for this visit. There is no height or weight on file to calculate BMI.    General: Well developed, well nourished, no acute distress Head: Normocephalic and atraumatic Eyes: Sclerae anicteric, EOMI Ears: Normal auditory acuity Mouth: No deformities or lesions noted Lungs: Clear throughout to auscultation Heart: Regular rate and rhythm; No murmurs, rubs or bruits Abdomen: Soft, non tender and non distended. No masses, hepatosplenomegaly or hernias noted. Normal Bowel sounds Rectal: Musculoskeletal: Symmetrical with no gross deformities  Pulses:  Normal pulses noted Extremities: No edema or deformities noted Neurological: Alert oriented x 4, grossly nonfocal Psychological:  Alert and cooperative. Normal mood and affect  Review of Data  The following data was reviewed at the time of this encounter:  Laboratory Studies      Latest Ref Rng & Units 12/01/2023    6:49 AM 11/30/2023    2:42 PM 11/30/2023    2:18 PM  CBC  WBC 4.0 - 10.5 K/uL 7.4   11.2    Hemoglobin 13.0 - 17.0 g/dL 16.1  09.6  04.5   Hematocrit 39.0 - 52.0 % 37.9  46.0  44.3   Platelets 150 - 400 K/uL 231   253     Lab Results  Component Value Date   LIPASE 21 11/30/2023      Latest Ref Rng & Units 12/02/2023   11:24 AM 12/01/2023    6:50 AM 12/01/2023    6:49 AM  CMP  Glucose 70 - 99 mg/dL 86   77   BUN 8 - 23 mg/dL 22   20   Creatinine 4.09 - 1.24 mg/dL 8.11  9.14  7.82   Sodium 135 - 145 mmol/L 137  137   Potassium 3.5 - 5.1 mmol/L 5.1   3.9   Chloride 98 - 111 mmol/L 102   104   CO2 22 - 32 mmol/L 29   26   Calcium 8.9 - 10.3 mg/dL 9.2   8.7      Imaging Studies  Barium swallow 2023/09/06 FINDINGS: Swallowing: Swallowing evaluated during the modified barium swallow. Appears normal, no vestibular aspiration or penetration seen.   Pharynx: Unremarkable.   Esophagus: Tortuous proximal thoracic esophagus. Esophagus with smooth contour, no mucosal abnormality or stricture.   Esophageal motility: Decreased.   Hiatal Hernia: None.   Gastroesophageal reflux: Spontaneous gastroesophageal reflux seen, to the level of T7-T8. Captured in series 2.   Ingested 13mm barium tablet: Passed normally.   Other: Surgical hardware seen in C-spine and T-spine. Status post aortic valve replacement.   IMPRESSION: Esophageal dysmotility with gastroesophageal reflux.  GI Procedures and Studies  Modified barium swallow 2023-09-06 Pt demonstrates functional swallowing. There is premature spillage to pyriform sinuses with instances of trace flash penetration. No aspiration. Hardware is behind the spinal column and does not impact the cervical esophagus. Pill hesitated above the PES but passed with a second sip of barium. No further swallowing intervention needed.   EGD 10/14/2018 One cratered gastric ulcer measuring 10 mm found in gastric cardia and fundus, otherwise normal Path: Focal active inflammation, no H. pylori  Colonoscopy 11/20/2017 Two 1 to 2 mm polyps  in the rectum, IH Path: Hyperplastic polyps     Clinical Impression  It is my clinical impression that Mr. Harig is a 83 y.o. male with;  ***  Plan  *** *** *** *** ***  Planned Follow Up No follow-ups on file.  The patient or caregiver verbalized understanding of the material covered, with no barriers to understanding. All questions were answered. Patient or caregiver is agreeable with the plan outlined above.    It was a pleasure to see Assael.  If you have any questions or concerns regarding this evaluation, do not hesitate to contact me.  Maren Beach, MD [facility]

## 2024-01-07 ENCOUNTER — Ambulatory Visit: Payer: Medicare Other | Admitting: Pediatrics

## 2024-01-07 DIAGNOSIS — R1310 Dysphagia, unspecified: Secondary | ICD-10-CM

## 2024-01-09 ENCOUNTER — Ambulatory Visit: Payer: Medicare Other | Admitting: Gastroenterology

## 2024-01-29 ENCOUNTER — Other Ambulatory Visit: Payer: Self-pay

## 2024-01-29 ENCOUNTER — Emergency Department (HOSPITAL_COMMUNITY): Payer: Medicare Other

## 2024-01-29 ENCOUNTER — Inpatient Hospital Stay (HOSPITAL_COMMUNITY)
Admission: EM | Admit: 2024-01-29 | Discharge: 2024-01-31 | DRG: 309 | Disposition: A | Discharge status: Home / Self Care | Payer: Medicare Other | Attending: Family Medicine | Admitting: Family Medicine

## 2024-01-29 DIAGNOSIS — Z7982 Long term (current) use of aspirin: Secondary | ICD-10-CM

## 2024-01-29 DIAGNOSIS — Z96653 Presence of artificial knee joint, bilateral: Secondary | ICD-10-CM | POA: Diagnosis present

## 2024-01-29 DIAGNOSIS — R1032 Left lower quadrant pain: Secondary | ICD-10-CM | POA: Diagnosis not present

## 2024-01-29 DIAGNOSIS — E7800 Pure hypercholesterolemia, unspecified: Secondary | ICD-10-CM | POA: Diagnosis present

## 2024-01-29 DIAGNOSIS — Z8049 Family history of malignant neoplasm of other genital organs: Secondary | ICD-10-CM

## 2024-01-29 DIAGNOSIS — I4821 Permanent atrial fibrillation: Secondary | ICD-10-CM | POA: Diagnosis not present

## 2024-01-29 DIAGNOSIS — I2510 Atherosclerotic heart disease of native coronary artery without angina pectoris: Secondary | ICD-10-CM | POA: Diagnosis present

## 2024-01-29 DIAGNOSIS — I10 Essential (primary) hypertension: Secondary | ICD-10-CM | POA: Diagnosis present

## 2024-01-29 DIAGNOSIS — G894 Chronic pain syndrome: Secondary | ICD-10-CM | POA: Diagnosis present

## 2024-01-29 DIAGNOSIS — D72829 Elevated white blood cell count, unspecified: Secondary | ICD-10-CM | POA: Diagnosis present

## 2024-01-29 DIAGNOSIS — Z79899 Other long term (current) drug therapy: Secondary | ICD-10-CM

## 2024-01-29 DIAGNOSIS — Z981 Arthrodesis status: Secondary | ICD-10-CM

## 2024-01-29 DIAGNOSIS — Z7901 Long term (current) use of anticoagulants: Secondary | ICD-10-CM

## 2024-01-29 DIAGNOSIS — R079 Chest pain, unspecified: Secondary | ICD-10-CM

## 2024-01-29 DIAGNOSIS — Z888 Allergy status to other drugs, medicaments and biological substances status: Secondary | ICD-10-CM

## 2024-01-29 DIAGNOSIS — I4891 Unspecified atrial fibrillation: Principal | ICD-10-CM | POA: Diagnosis present

## 2024-01-29 DIAGNOSIS — Z87891 Personal history of nicotine dependence: Secondary | ICD-10-CM

## 2024-01-29 DIAGNOSIS — I5032 Chronic diastolic (congestive) heart failure: Secondary | ICD-10-CM | POA: Diagnosis present

## 2024-01-29 DIAGNOSIS — I110 Hypertensive heart disease with heart failure: Secondary | ICD-10-CM | POA: Diagnosis present

## 2024-01-29 DIAGNOSIS — Z801 Family history of malignant neoplasm of trachea, bronchus and lung: Secondary | ICD-10-CM

## 2024-01-29 DIAGNOSIS — Z8673 Personal history of transient ischemic attack (TIA), and cerebral infarction without residual deficits: Secondary | ICD-10-CM

## 2024-01-29 DIAGNOSIS — Z952 Presence of prosthetic heart valve: Secondary | ICD-10-CM

## 2024-01-29 DIAGNOSIS — M549 Dorsalgia, unspecified: Secondary | ICD-10-CM | POA: Diagnosis present

## 2024-01-29 DIAGNOSIS — R109 Unspecified abdominal pain: Secondary | ICD-10-CM

## 2024-01-29 LAB — BASIC METABOLIC PANEL
Anion gap: 10 (ref 5–15)
BUN: 17 mg/dL (ref 8–23)
CO2: 20 mmol/L — ABNORMAL LOW (ref 22–32)
Calcium: 8 mg/dL — ABNORMAL LOW (ref 8.9–10.3)
Chloride: 107 mmol/L (ref 98–111)
Creatinine, Ser: 0.85 mg/dL (ref 0.61–1.24)
GFR, Estimated: >60 mL/min (ref >60)
Glucose, Bld: 81 mg/dL (ref 70–99)
Potassium: 3.9 mmol/L (ref 3.5–5.1)
Sodium: 137 mmol/L (ref 135–145)

## 2024-01-29 LAB — CBC
HCT: 42.7 % (ref 39.0–52.0)
Hemoglobin: 14.4 g/dL (ref 13.0–17.0)
MCH: 32.8 pg (ref 26.0–34.0)
MCHC: 33.7 g/dL (ref 30.0–36.0)
MCV: 97.3 fL (ref 80.0–100.0)
Platelets: 242 K/uL (ref 150–400)
RBC: 4.39 MIL/uL (ref 4.22–5.81)
RDW: 15 % (ref 11.5–15.5)
WBC: 10.4 K/uL (ref 4.0–10.5)
nRBC: 0 % (ref 0.0–0.2)

## 2024-01-29 LAB — HEPATIC FUNCTION PANEL
ALT: 23 U/L (ref 0–44)
AST: 25 U/L (ref 15–41)
Albumin: 3 g/dL — ABNORMAL LOW (ref 3.5–5.0)
Alkaline Phosphatase: 71 U/L (ref 38–126)
Bilirubin, Direct: 0.3 mg/dL — ABNORMAL HIGH (ref 0.0–0.2)
Indirect Bilirubin: 0.9 mg/dL (ref 0.3–0.9)
Total Bilirubin: 1.2 mg/dL (ref 0.0–1.2)
Total Protein: 6.2 g/dL — ABNORMAL LOW (ref 6.5–8.1)

## 2024-01-29 LAB — RESP PANEL BY RT-PCR (RSV, FLU A&B, COVID)  RVPGX2
Influenza A by PCR: NEGATIVE
Influenza B by PCR: NEGATIVE
Resp Syncytial Virus by PCR: NEGATIVE
SARS Coronavirus 2 by RT PCR: NEGATIVE

## 2024-01-29 LAB — TROPONIN I (HIGH SENSITIVITY): Troponin I (High Sensitivity): 10 ng/L (ref <18)

## 2024-01-29 MED ORDER — DILTIAZEM LOAD VIA INFUSION
10.0000 mg | Freq: Once | INTRAVENOUS | Status: AC
  Administered 2024-01-29: 10 mg via INTRAVENOUS
  Filled 2024-01-29: qty 10

## 2024-01-29 MED ORDER — HYDROMORPHONE HCL 1 MG/ML IJ SOLN
0.5000 mg | Freq: Once | INTRAMUSCULAR | Status: AC
  Administered 2024-01-29: 0.5 mg via INTRAVENOUS
  Filled 2024-01-29: qty 1

## 2024-01-29 MED ORDER — IOHEXOL 350 MG/ML SOLN
75.0000 mL | Freq: Once | INTRAVENOUS | Status: AC | PRN
  Administered 2024-01-29: 75 mL via INTRAVENOUS

## 2024-01-29 MED ORDER — DILTIAZEM HCL-DEXTROSE 125-5 MG/125ML-% IV SOLN (PREMIX)
5.0000 mg/h | INTRAVENOUS | Status: DC
  Administered 2024-01-29 – 2024-01-30 (×2): 5 mg/h via INTRAVENOUS
  Filled 2024-01-29 (×2): qty 125

## 2024-01-29 NOTE — ED Provider Notes (Signed)
[place] EMERGENCY DEPARTMENT AT [facility] Provider Note   CSN: [id number] Arrival date & time: 01/29/24  2025     History {Add pertinent medical, surgical, social history, OB history to HPI:1} Chief Complaint  Patient presents with   Shortness of Breath         [name] is a 83 y.o. male.  83 year old male with a history of atrial fibrillation on metoprolol and diltiazem, TAVR, HTN, CAD, chronic back pain who presents emergency department with multiple complaints.  Patient is a poor historian.  Reports that he has felt poorly for the past day or 2.  Unable to elaborate further on this.  Is having some epigastric abdominal pain.  Told triage that he was having left lower quadrant abdominal pain.  Additional history obtained per sister who reports that he was out of his pain pills (believes them to be percocet 10-325) and that he had them refilled on Monday.  She is unsure what other symptoms he has been having.       Home Medications Prior to Admission medications   Medication Sig Start Date End Date Taking? Authorizing Provider  aspirin EC 81 MG tablet Take 81 mg by mouth daily.    [provider]  buPROPion (WELLBUTRIN) 100 MG tablet Take 100 mg by mouth 2 (two) times daily.    [provider]  cyanocobalamin (,VITAMIN B-12,) 1000 MCG/ML injection Inject 1 mL into the muscle every 28 (twenty-eight) days. 03/15/20   [provider]  diltiazem (CARDIZEM CD) 120 MG 24 hr capsule Take 1 capsule (120 mg total) by mouth daily. 10/09/19   Kathlen Mody, MD  ferrous sulfate 325 (65 FE) MG tablet Take 325 mg by mouth daily with breakfast. 03/15/20   [provider]  furosemide (LASIX) 20 MG tablet Take 20 mg by mouth daily. May take one additional tablet as needed for leg swelling.    [provider]  gabapentin (NEURONTIN) 400 MG capsule Take 1 capsule (400 mg total) by mouth 3 (three) times daily. 12/03/23   Marinda Elk, MD  metoprolol succinate (TOPROL-XL) 25 MG 24 hr tablet Take 25 mg by mouth daily.    [provider]  oxyCODONE-acetaminophen (PERCOCET) 10-325 MG tablet Take 1 tablet by mouth 5 (five) times daily as needed for pain.    [provider]  polyethylene glycol (MIRALAX / GLYCOLAX) packet Take 17 g by mouth daily as needed for mild constipation.    [provider]  potassium chloride (K-DUR,KLOR-CON) 20 MEQ tablet Take 1 tablet (20 mEq total) by mouth daily. Patient taking differently: Take 20 mEq by mouth at bedtime. 09/20/18   Strader, Lennart Pall, PA-C  rOPINIRole (REQUIP) 0.25 MG tablet Take 0.25 mg by mouth 2 (two) times daily.  10/09/18   [provider]  silver sulfADIAZINE (SILVADENE) 1 % cream Apply 1 Application topically. 11/25/23   [provider]  simvastatin (ZOCOR) 40 MG tablet Take 40 mg by mouth at bedtime.    [provider]  tamsulosin (FLOMAX) 0.4 MG CAPS capsule Take 0.8 mg by mouth daily. 09/02/19   [provider]      Allergies    Lorazepam, Duloxetine, Pregabalin, and Zanaflex [tizanidine hcl]    Review of Systems   Review of Systems  Physical Exam Updated Vital Signs BP (!) 154/99   Pulse (!) 132   Temp (!) 97.4 F (36.3 C) (Oral)   Resp (!) 25   Ht 5\' 9"  (  1.753 m)   Wt 90.7 kg   SpO2 100%   BMI 29.53 kg/m  Physical Exam Vitals and nursing note reviewed.  Constitutional:      General: He is not in acute distress.    Appearance: He is well-developed.  HENT:     Head: Normocephalic and atraumatic.     Right Ear: External ear normal.     Left Ear: External ear normal.     Nose: Nose normal.  Eyes:     Extraocular Movements: Extraocular movements intact.     Conjunctiva/sclera: Conjunctivae normal.     Pupils: Pupils are equal, round, and reactive to light.  Cardiovascular:     Rate and Rhythm: Tachycardia present. Rhythm irregular.     Heart sounds: Normal heart sounds.     Comments:  Radial pulses 2+ bilateral Pulmonary:     Effort: Pulmonary effort is normal. No respiratory distress.     Breath sounds: Normal breath sounds.  Abdominal:     General: There is no distension.     Palpations: Abdomen is soft. There is no mass.     Tenderness: There is abdominal tenderness. There is no guarding.  Musculoskeletal:     Cervical back: Normal range of motion and neck supple.     Right lower leg: No edema.     Left lower leg: No edema.  Skin:    General: Skin is warm and dry.  Neurological:     Mental Status: He is alert. Mental status is at baseline.  Psychiatric:        Mood and Affect: Mood normal.        Behavior: Behavior normal.     ED Results / Procedures / Treatments   Labs (all labs ordered are listed, but only abnormal results are displayed) Labs Reviewed  CBC  BASIC METABOLIC PANEL  TROPONIN I (HIGH SENSITIVITY)    EKG None  Radiology DG Chest Portable 1 View Result Date: 01/29/2024 CLINICAL DATA:  Chest pain or shortness of breath EXAM: PORTABLE CHEST 1 VIEW COMPARISON:  12/01/2023 FINDINGS: Stable cardiomegaly. Aortic atherosclerotic calcification. TAVR. Low lung volumes accentuate pulmonary vascularity. Left basilar atelectasis. No definite pleural effusion. No pneumothorax. Cervical and thoracic spine fusion hardware. No displaced rib fractures. IMPRESSION: Low lung volumes with left basilar atelectasis. Electronically Signed   By: Minerva Fester M.D.   On: 01/29/2024 21:09    Procedures Procedures  {Document cardiac monitor, telemetry assessment procedure when appropriate:1}  Medications Ordered in ED Medications  HYDROmorphone (DILAUDID) injection 0.5 mg (has no administration in time range)    ED Course/ Medical Decision Making/ A&P   {   Click here for ABCD2, HEART and other calculatorsREFRESH Note before signing :1}                              Medical Decision Making Amount and/or Complexity of Data Reviewed Labs:  ordered. Radiology: ordered.  Risk Prescription drug management.   [name] is a 83 y.o. male with comorbidities that complicate the patient evaluation including atrial fibrillation on metoprolol and diltiazem, TAVR, HTN, CAD, chronic back pain who presents emergency department with multiple complaints.   Initial Ddx:  ***   MDM/Course:  *** Upon re-evaluation ***  This patient presents to the ED for concern of complaints listed in HPI, this involves an extensive number of treatment options, and is a complaint that carries with it a high risk of complications  and morbidity. Disposition including potential need for admission considered.   Dispo: {Disposition:28069}  Additional history obtained from {Additional History:28067} Records reviewed {Records Reviewed:28068} The following labs were independently interpreted: {labs interpreted:28064} and show {lab findings:28250} I independently reviewed the following imaging with scope of interpretation limited to determining acute life threatening conditions related to emergency care: {imaging interpreted:28065} and agree with the radiologist interpretation with the following exceptions: none I personally reviewed and interpreted cardiac monitoring: {cardiac monitoring:28251} I personally reviewed and interpreted the pt's EKG: see above for interpretation  I have reviewed the patients home medications and made adjustments as needed Consults: {Consultants:28063} Social Determinants of health:  ***  Portions of this note were generated with Scientist, clinical (histocompatibility and immunogenetics). Dictation errors may occur despite best attempts at proofreading.    {Document your decision making why or why not admission, treatments were needed:1} Final Clinical Impression(s) / ED Diagnoses Final diagnoses:  None    Rx / DC Orders ED Discharge Orders     None

## 2024-01-29 NOTE — Progress Notes (Deleted)
 @Patient  ID: [name], male    DOB: May 23, 1941, 83 y.o.   MRN: [id number]  No chief complaint on file.   Referring provider: Dois Davenport, MD  HPI:  83 year old gentleman, past medical history of atrial fibrillation, severe aortic stenosis status post TAVR in 2019, chronic pain syndrome, arthritis.  Patient was referred after having CT scan of the chest for pulmonary nodules.Patient had CT scan of the chest completed in May 2024.  This was completed during recent hospitalization in the emergency room.  Patient was found to have several inflammatory appearing nodules 1 with early mild cavitation concerning for pneumonia versus septic emboli in May 2024. Patient has no respiratory complaints. He lives with his sister. In a wheelchair and walks with a cane.   Previous [facility] pulmonary encounter: OV 08/22/2023: Here today for follow-up after recent CT scan of the chest.Patient was seen initially for evaluation after repeat CT.  He is 83 years old with multiple medical comorbidities.  Follow-up.  He had CT imaging of his chest discomfort yesterday which showed lung nodules had resolved.  No cognitive exam needed.  Pain, former smoker.Patient had a August CT scan from 08/09/2023 completed that showed the previous patchy poorly marginated pulmonary nodules with groundglass halos had resolved.  Had numerous similar patchy small nodules that were present throughout the left lower lobe.  Suggestive of recurrent aspiration.When the patient was counseled on swallowing dysfunction he does state that sometimes food gets stuck when he swallows right at the entrance of his thorax.  01/30/2024- Interim hx  Patient presents today for follow-up. He had CT chest in December which showed lung nodules had resolved, no further follow-up needed.        Allergies  Allergen Reactions   Lorazepam Other (See Comments)    Delirium   Duloxetine Other (See Comments)    Increased depression, irritability    Pregabalin Swelling and Other (See Comments)    Flashbacks, nightmares, aggression   Zanaflex [Tizanidine Hcl] Swelling and Anxiety    Immunization History  Administered Date(s) Administered   DTaP 12/17/2008   Influenza, High Dose Seasonal PF 09/21/2013, 10/06/2015, 08/07/2019   Influenza, Seasonal, Injecte, Preservative Fre 09/21/2013   Influenza,inj,quad, With Preservative 09/16/2020   Influenza-Unspecified 11/16/2013, 12/13/2014, 11/27/2022   Moderna Covid-19 Fall Seasonal Vaccine 45yrs & older 12/11/2022   PFIZER(Purple Top)SARS-COV-2 Vaccination 02/09/2020, 03/01/2020, 10/07/2020   Pneumococcal Conjugate-13 04/01/2015   Pneumococcal Polysaccharide-23 05/21/2003, 07/18/2017   Pneumococcal-Unspecified 08/06/2011   Td 06/16/2006   Tdap 04/01/2015, 07/10/2019   Zoster Recombinant(Shingrix) 07/10/2019, 01/11/2023   Zoster, Live 06/18/2016    Past Medical History:  Diagnosis Date   Cancer (HCC)    Carpal tunnel syndrome    Cervical spondylosis with myelopathy    Chronic pain syndrome    Depression    Difficult intubation    needs smaller tube; awake oral fiberoptic scope 8.0 ETT 03/30/08   Dysphagia, pharyngeal phase    Hemorrhage of gastrointestinal tract, unspecified    History of cardiac cath    a. 03/2018 Cath: LAD and LCX with mild luminal irregularities.  No significant disease.   Iron deficiency anemia, unspecified    Lumbosacral spondylosis without myelopathy    Muscle weakness (generalized)    Other and unspecified disc disorder of cervical region    Other and unspecified hyperlipidemia    Patent foramen ovale    a. 05/2018 Echo: post-op TAVR--> + bublble study w/ L->R shunt.   Permanent atrial fibrillation (HCC)    a.  Diagnosed in the spring 2019.  Had rapid atrial fibrillation following TAVR and has been rate controlled with beta-blocker.  CHA2DS2VASc equals 7.  Supposed to be on Eliquis.   Primary localized osteoarthrosis, lower leg    Primary localized  osteoarthrosis, shoulder region    Reflux esophagitis    Severe aortic stenosis    a. mild-mod AS by 01/2016 TTE, restricted AV opening with mod AR 01/2016 TEE; b. 10/2017 Echo: EF 60-65%, sev Ca2+ AoV, mean grad ; c. 05/2018 s/p TAVR ([facility]); d. 06/2018 Echo: EF 55-60%, well-seated prosth AoV, peak velocity 156cm/s. AoV gradient .   Spinal stenosis, unspecified region other than cervical    Stroke (HCC)    01/2016   Syncope and collapse    Unspecified arthropathy, lower leg    Unspecified constipation    Unspecified essential hypertension    Unspecified glaucoma(365.9)     Tobacco History: Social History   Tobacco Use  Smoking Status Former   Current packs/day: 0.00   Types: Cigarettes   Quit date: 08/29/1985   Years since quitting: 38.4  Smokeless Tobacco Former   Quit date: 02/12/1985   Counseling given: Not Answered   Outpatient Medications Prior to Visit  Medication Sig Dispense Refill   aspirin EC 81 MG tablet Take 81 mg by mouth daily.     buPROPion (WELLBUTRIN) 100 MG tablet Take 100 mg by mouth 2 (two) times daily.     cyanocobalamin (,VITAMIN B-12,) 1000 MCG/ML injection Inject 1 mL into the muscle every 28 (twenty-eight) days.     diltiazem (CARDIZEM CD) 120 MG 24 hr capsule Take 1 capsule (120 mg total) by mouth daily. 30 capsule 1   ferrous sulfate 325 (65 FE) MG tablet Take 325 mg by mouth daily with breakfast.     furosemide (LASIX) 20 MG tablet Take 20 mg by mouth daily. May take one additional tablet as needed for leg swelling.     gabapentin (NEURONTIN) 400 MG capsule Take 1 capsule (400 mg total) by mouth 3 (three) times daily. 90 capsule 0   metoprolol succinate (TOPROL-XL) 25 MG 24 hr tablet Take 25 mg by mouth daily.     oxyCODONE-acetaminophen (PERCOCET) 10-325 MG tablet Take 1 tablet by mouth 5 (five) times daily as needed for pain.     polyethylene glycol (MIRALAX / GLYCOLAX) packet Take 17 g by mouth daily as needed for mild constipation.      potassium chloride (K-DUR,KLOR-CON) 20 MEQ tablet Take 1 tablet (20 mEq total) by mouth daily. (Patient taking differently: Take 20 mEq by mouth at bedtime.) 30 tablet 5   rOPINIRole (REQUIP) 0.25 MG tablet Take 0.25 mg by mouth 2 (two) times daily.   3   silver sulfADIAZINE (SILVADENE) 1 % cream Apply 1 Application topically.     simvastatin (ZOCOR) 40 MG tablet Take 40 mg by mouth at bedtime.     tamsulosin (FLOMAX) 0.4 MG CAPS capsule Take 0.8 mg by mouth daily.     No facility-administered medications prior to visit.      Review of Systems  Review of Systems   Physical Exam  There were no vitals taken for this visit. Physical Exam   Lab Results:  CBC    Component Value Date/Time   WBC 7.4 12/01/2023 0649   RBC 3.88 (L) 12/01/2023 0649   HGB 12.3 (L) 12/01/2023 0649   HCT 37.9 (L) 12/01/2023 0649   PLT 231 12/01/2023 0649   MCV 97.7 12/01/2023 0649   MCH 31.7  12/01/2023 0649   MCHC 32.5 12/01/2023 0649   RDW 14.7 12/01/2023 0649   RDW 13.7 03/12/2013 0937   LYMPHSABS 0.9 10/26/2023 1028   LYMPHSABS 1.4 03/12/2013 0937   MONOABS 0.8 10/26/2023 1028   EOSABS 0.2 10/26/2023 1028   EOSABS 0.4 03/12/2013 0937   BASOSABS 0.0 10/26/2023 1028   BASOSABS 0.0 03/12/2013 0937    BMET    Component Value Date/Time   NA 137 12/02/2023 1124   NA 141 03/12/2013 0937   K 5.1 12/02/2023 1124   CL 102 12/02/2023 1124   CO2 29 12/02/2023 1124   GLUCOSE 86 12/02/2023 1124   BUN 22 12/02/2023 1124   BUN 12 03/12/2013 0937   CREATININE 1.14 12/02/2023 1124   CALCIUM 9.2 12/02/2023 1124   GFRNONAA >60 12/02/2023 1124   GFRAA >60 11/16/2019 0242    BNP    Component Value Date/Time   BNP 190.3 (H) 04/29/2023 1511    ProBNP    Component Value Date/Time   PROBNP 40.5 04/24/2010 1617    Imaging: No results found.   Assessment & Plan:   No problem-specific Assessment & Plan notes found for this encounter.     Glenford Bayley, NP 01/29/2024

## 2024-01-29 NOTE — ED Notes (Signed)
Patient transported to CT on CCM with this RN

## 2024-01-29 NOTE — ED Triage Notes (Addendum)
Patient via [facility] from home c/o chest pain, abdominal pain, and shortness of breath x 1 day. Left sided chest pain - describes as tightness. Reports LLQ abd pain.   EKG showed Afib 115-147; hx Afib  Sp002 97% on room air BP 155/105  20g left wrist - NS infused en route.

## 2024-01-30 ENCOUNTER — Ambulatory Visit: Payer: Medicare Other | Admitting: Primary Care

## 2024-01-30 ENCOUNTER — Inpatient Hospital Stay (HOSPITAL_COMMUNITY): Payer: Medicare Other

## 2024-01-30 ENCOUNTER — Other Ambulatory Visit (HOSPITAL_COMMUNITY): Payer: Medicare Other

## 2024-01-30 ENCOUNTER — Encounter (HOSPITAL_COMMUNITY): Payer: Self-pay | Admitting: Internal Medicine

## 2024-01-30 DIAGNOSIS — Z8049 Family history of malignant neoplasm of other genital organs: Secondary | ICD-10-CM | POA: Diagnosis not present

## 2024-01-30 DIAGNOSIS — I5032 Chronic diastolic (congestive) heart failure: Secondary | ICD-10-CM | POA: Diagnosis present

## 2024-01-30 DIAGNOSIS — Z8673 Personal history of transient ischemic attack (TIA), and cerebral infarction without residual deficits: Secondary | ICD-10-CM | POA: Diagnosis not present

## 2024-01-30 DIAGNOSIS — Z888 Allergy status to other drugs, medicaments and biological substances status: Secondary | ICD-10-CM | POA: Diagnosis not present

## 2024-01-30 DIAGNOSIS — M549 Dorsalgia, unspecified: Secondary | ICD-10-CM | POA: Diagnosis present

## 2024-01-30 DIAGNOSIS — Z79899 Other long term (current) drug therapy: Secondary | ICD-10-CM | POA: Diagnosis not present

## 2024-01-30 DIAGNOSIS — Z801 Family history of malignant neoplasm of trachea, bronchus and lung: Secondary | ICD-10-CM | POA: Diagnosis not present

## 2024-01-30 DIAGNOSIS — Z7982 Long term (current) use of aspirin: Secondary | ICD-10-CM | POA: Diagnosis not present

## 2024-01-30 DIAGNOSIS — I4891 Unspecified atrial fibrillation: Secondary | ICD-10-CM | POA: Diagnosis not present

## 2024-01-30 DIAGNOSIS — E7800 Pure hypercholesterolemia, unspecified: Secondary | ICD-10-CM | POA: Diagnosis present

## 2024-01-30 DIAGNOSIS — R1032 Left lower quadrant pain: Secondary | ICD-10-CM | POA: Diagnosis present

## 2024-01-30 DIAGNOSIS — Z7901 Long term (current) use of anticoagulants: Secondary | ICD-10-CM | POA: Diagnosis not present

## 2024-01-30 DIAGNOSIS — I2510 Atherosclerotic heart disease of native coronary artery without angina pectoris: Secondary | ICD-10-CM | POA: Diagnosis present

## 2024-01-30 DIAGNOSIS — Z952 Presence of prosthetic heart valve: Secondary | ICD-10-CM | POA: Diagnosis not present

## 2024-01-30 DIAGNOSIS — G894 Chronic pain syndrome: Secondary | ICD-10-CM | POA: Diagnosis present

## 2024-01-30 DIAGNOSIS — Z87891 Personal history of nicotine dependence: Secondary | ICD-10-CM | POA: Diagnosis not present

## 2024-01-30 DIAGNOSIS — Z96653 Presence of artificial knee joint, bilateral: Secondary | ICD-10-CM | POA: Diagnosis present

## 2024-01-30 DIAGNOSIS — Z981 Arthrodesis status: Secondary | ICD-10-CM | POA: Diagnosis not present

## 2024-01-30 DIAGNOSIS — I4821 Permanent atrial fibrillation: Secondary | ICD-10-CM | POA: Diagnosis present

## 2024-01-30 DIAGNOSIS — I110 Hypertensive heart disease with heart failure: Secondary | ICD-10-CM | POA: Diagnosis present

## 2024-01-30 DIAGNOSIS — D72829 Elevated white blood cell count, unspecified: Secondary | ICD-10-CM | POA: Diagnosis present

## 2024-01-30 LAB — MRSA NEXT GEN BY PCR, NASAL: MRSA by PCR Next Gen: NOT DETECTED

## 2024-01-30 LAB — URINALYSIS, ROUTINE W REFLEX MICROSCOPIC
Bilirubin Urine: NEGATIVE
Glucose, UA: NEGATIVE mg/dL
Hgb urine dipstick: NEGATIVE
Ketones, ur: 5 mg/dL — AB
Leukocytes,Ua: NEGATIVE
Nitrite: NEGATIVE
Protein, ur: NEGATIVE mg/dL
Specific Gravity, Urine: 1.024 (ref 1.005–1.030)
pH: 7 (ref 5.0–8.0)

## 2024-01-30 LAB — RESPIRATORY PANEL BY PCR

## 2024-01-30 LAB — TROPONIN I (HIGH SENSITIVITY)
Troponin I (High Sensitivity): 10 ng/L (ref <18)
Troponin I (High Sensitivity): 13 ng/L (ref <18)

## 2024-01-30 LAB — RAPID URINE DRUG SCREEN, HOSP PERFORMED
Amphetamines: NOT DETECTED
Barbiturates: NOT DETECTED
Benzodiazepines: NOT DETECTED
Cocaine: NOT DETECTED
Opiates: POSITIVE — AB
Tetrahydrocannabinol: NOT DETECTED

## 2024-01-30 LAB — COMPREHENSIVE METABOLIC PANEL
ALT: 26 U/L (ref 0–44)
AST: 24 U/L (ref 15–41)
Albumin: 3.5 g/dL (ref 3.5–5.0)
Alkaline Phosphatase: 80 U/L (ref 38–126)
Anion gap: 19 — ABNORMAL HIGH (ref 5–15)
BUN: 18 mg/dL (ref 8–23)
CO2: 17 mmol/L — ABNORMAL LOW (ref 22–32)
Calcium: 9.7 mg/dL (ref 8.9–10.3)
Chloride: 101 mmol/L (ref 98–111)
Creatinine, Ser: 0.94 mg/dL (ref 0.61–1.24)
GFR, Estimated: >60 mL/min (ref >60)
Glucose, Bld: 84 mg/dL (ref 70–99)
Potassium: 4.3 mmol/L (ref 3.5–5.1)
Sodium: 137 mmol/L (ref 135–145)
Total Bilirubin: 1.3 mg/dL — ABNORMAL HIGH (ref 0.0–1.2)
Total Protein: 7.2 g/dL (ref 6.5–8.1)

## 2024-01-30 LAB — ECHOCARDIOGRAM COMPLETE
AR max vel: 1.67 cm2
AV Area VTI: 1.69 cm2
AV Area mean vel: 1.6 cm2
AV Mean grad: 4.6 mm[Hg]
AV Peak grad: 8.5 mm[Hg]
Ao pk vel: 1.46 m/s
Height: 69 in
MV VTI: 1.37 cm2
S' Lateral: 2.7 cm
Weight: 3368 [oz_av]

## 2024-01-30 LAB — TSH: TSH: 1.597 u[IU]/mL (ref 0.350–4.500)

## 2024-01-30 LAB — HEMOGLOBIN A1C
Hgb A1c MFr Bld: 5.3 % (ref 4.8–5.6)
Mean Plasma Glucose: 105.41 mg/dL

## 2024-01-30 LAB — ETHANOL: Alcohol, Ethyl (B): <10 mg/dL (ref <10)

## 2024-01-30 LAB — CBC
HCT: 42.7 % (ref 39.0–52.0)
Hemoglobin: 14.4 g/dL (ref 13.0–17.0)
MCH: 32.6 pg (ref 26.0–34.0)
MCHC: 33.7 g/dL (ref 30.0–36.0)
MCV: 96.6 fL (ref 80.0–100.0)
Platelets: 297 10*3/uL (ref 150–400)
RBC: 4.42 MIL/uL (ref 4.22–5.81)
RDW: 15.1 % (ref 11.5–15.5)
WBC: 12.7 10*3/uL — ABNORMAL HIGH (ref 4.0–10.5)
nRBC: 0 % (ref 0.0–0.2)

## 2024-01-30 LAB — MAGNESIUM: Magnesium: 1.9 mg/dL (ref 1.7–2.4)

## 2024-01-30 LAB — PHOSPHORUS: Phosphorus: 2.9 mg/dL (ref 2.5–4.6)

## 2024-01-30 MED ORDER — ASPIRIN 81 MG PO TBEC
81.0000 mg | DELAYED_RELEASE_TABLET | Freq: Every day | ORAL | Status: DC
Start: 2024-01-30 — End: 2024-01-31
  Administered 2024-01-30 – 2024-01-31 (×2): 81 mg via ORAL
  Filled 2024-01-30 (×2): qty 1

## 2024-01-30 MED ORDER — FERROUS SULFATE 325 (65 FE) MG PO TABS
325.0000 mg | ORAL_TABLET | Freq: Every day | ORAL | Status: DC
  Administered 2024-01-31: 325 mg via ORAL
  Filled 2024-01-30: qty 1

## 2024-01-30 MED ORDER — APIXABAN 5 MG PO TABS
5.0000 mg | ORAL_TABLET | Freq: Two times a day (BID) | ORAL | Status: DC
  Administered 2024-01-30 – 2024-01-31 (×2): 5 mg via ORAL
  Filled 2024-01-30 (×2): qty 1

## 2024-01-30 MED ORDER — TAMSULOSIN HCL 0.4 MG PO CAPS
0.8000 mg | ORAL_CAPSULE | Freq: Every day | ORAL | Status: DC
  Administered 2024-01-30 – 2024-01-31 (×2): 0.8 mg via ORAL
  Filled 2024-01-30 (×2): qty 2

## 2024-01-30 MED ORDER — METOPROLOL SUCCINATE ER 25 MG PO TB24
25.0000 mg | ORAL_TABLET | Freq: Every day | ORAL | Status: DC
  Administered 2024-01-30 – 2024-01-31 (×2): 25 mg via ORAL
  Filled 2024-01-30 (×2): qty 1

## 2024-01-30 MED ORDER — GABAPENTIN 300 MG PO CAPS
400.0000 mg | ORAL_CAPSULE | Freq: Three times a day (TID) | ORAL | Status: DC
  Administered 2024-01-30 – 2024-01-31 (×2): 400 mg via ORAL
  Filled 2024-01-30 (×2): qty 1

## 2024-01-30 MED ORDER — ROPINIROLE HCL 0.25 MG PO TABS
0.2500 mg | ORAL_TABLET | Freq: Two times a day (BID) | ORAL | Status: DC
Start: 2024-01-30 — End: 2024-01-31
  Administered 2024-01-30 – 2024-01-31 (×2): 0.25 mg via ORAL
  Filled 2024-01-30 (×3): qty 1

## 2024-01-30 MED ORDER — OXYCODONE-ACETAMINOPHEN 5-325 MG PO TABS
2.0000 | ORAL_TABLET | Freq: Every day | ORAL | Status: DC | PRN
  Administered 2024-01-30 – 2024-01-31 (×4): 2 via ORAL
  Filled 2024-01-30 (×4): qty 2

## 2024-01-30 MED ORDER — CYANOCOBALAMIN 1000 MCG/ML IJ SOLN
1000.0000 ug | INTRAMUSCULAR | Status: DC
  Administered 2024-01-30: 1000 ug via INTRAMUSCULAR
  Filled 2024-01-30: qty 1

## 2024-01-30 MED ORDER — ACETAMINOPHEN 10 MG/ML IV SOLN
1000.0000 mg | Freq: Once | INTRAVENOUS | Status: AC
  Administered 2024-01-30: 1000 mg via INTRAVENOUS
  Filled 2024-01-30: qty 100

## 2024-01-30 MED ORDER — BUPROPION HCL 100 MG PO TABS
100.0000 mg | ORAL_TABLET | Freq: Two times a day (BID) | ORAL | Status: DC
Start: 2024-01-30 — End: 2024-01-31
  Administered 2024-01-30 – 2024-01-31 (×2): 100 mg via ORAL
  Filled 2024-01-30 (×3): qty 1

## 2024-01-30 MED ORDER — ONDANSETRON HCL 4 MG PO TABS: 4.0000 mg | ORAL_TABLET | Freq: Four times a day (QID) | ORAL | Status: DC | PRN

## 2024-01-30 MED ORDER — TRAMADOL HCL 50 MG PO TABS
50.0000 mg | ORAL_TABLET | Freq: Four times a day (QID) | ORAL | Status: DC | PRN
  Administered 2024-01-30: 50 mg via ORAL
  Filled 2024-01-30: qty 1

## 2024-01-30 MED ORDER — LEVALBUTEROL HCL 0.63 MG/3ML IN NEBU: 0.6300 mg | INHALATION_SOLUTION | Freq: Four times a day (QID) | RESPIRATORY_TRACT | Status: DC | PRN

## 2024-01-30 MED ORDER — FUROSEMIDE 20 MG PO TABS
20.0000 mg | ORAL_TABLET | Freq: Every day | ORAL | Status: DC
  Administered 2024-01-30 – 2024-01-31 (×2): 20 mg via ORAL
  Filled 2024-01-30 (×2): qty 1

## 2024-01-30 MED ORDER — POLYETHYLENE GLYCOL 3350 17 G PO PACK: 17.0000 g | PACK | Freq: Every day | ORAL | Status: DC | PRN

## 2024-01-30 MED ORDER — SIMVASTATIN 20 MG PO TABS
40.0000 mg | ORAL_TABLET | Freq: Every day | ORAL | Status: DC
Start: 2024-01-30 — End: 2024-01-31
  Administered 2024-01-30: 40 mg via ORAL
  Filled 2024-01-30: qty 2

## 2024-01-30 MED ORDER — ONDANSETRON HCL 4 MG/2ML IJ SOLN: 4.0000 mg | Freq: Four times a day (QID) | INTRAMUSCULAR | Status: DC | PRN

## 2024-01-30 MED ORDER — DILTIAZEM HCL ER COATED BEADS 120 MG PO CP24
120.0000 mg | ORAL_CAPSULE | Freq: Every day | ORAL | Status: DC
  Administered 2024-01-30 – 2024-01-31 (×2): 120 mg via ORAL
  Filled 2024-01-30 (×2): qty 1

## 2024-01-30 NOTE — H&P (Addendum)
History and Physical    [name] JXB:[id number] DOB: 16-Jul-1941 DOA: 01/29/2024  PCP: Dois Davenport, MD  Patient coming from: home  I have personally briefly reviewed patient's old medical records in [facility]  Chief Complaint: chest pain /abdominal pain /sob x 1 days   HPI: [name] is a 83 y.o. male with medical history significant of  essential hypertension, CAD aortic stenosis status post TAVR's, paroxysmal atrial fibrillation on Eliquis, hx of CVA, chronic back pain with multiple back surgeries who presents to ED with complaint of chest pain /abdominal pain and sob x 1 day. Patient is intermittently anxious, with complaint of pain all over and as a tearful labile affect.  Patient is a difficult  historian. Patient currently notes no current chest pain or abdominal pain.  He denies fever/chills/n/v/d.   ED Course: ON evaluation in ed patient was found to have afib with rvr  Vitals: Temp 97.4,BP 154/99, hr 132, rr 25  sat 100%  Wbc: 10.4, hgb 14.4, plt 242,  NA137, K 3.9, CL107, bicarb 20, cr 0.85 TP 6.2,   RVP neg CXR FINDINGS: Stable cardiomegaly. Aortic atherosclerotic calcification. TAVR. Low lung volumes accentuate pulmonary vascularity. Left basilar atelectasis. No definite pleural effusion. No pneumothorax. Cervical and thoracic spine fusion hardware. No displaced rib fractures.   IMPRESSION: Low lung volumes with left basilar atelectasis.  UA neg  CT IMPRESSION: 1. Negative for acute pulmonary embolism. 2. Small airway infection/inflammation. 3. No acute abnormality in the abdomen or pelvis. 4. Aortic Atherosclerosis (ICD10-I70.0).    Tx dilaudid/ diltiazem drip Review of Systems: As per HPI otherwise 10 point review of systems negative.   Past Medical History:  Diagnosis Date   Cancer [facility])    Carpal tunnel syndrome    Cervical spondylosis with myelopathy    Chronic pain syndrome    Depression    Difficult intubation    needs  smaller tube; awake oral fiberoptic scope 8.0 ETT 03/30/08   Dysphagia, pharyngeal phase    Hemorrhage of gastrointestinal tract, unspecified    History of cardiac cath    a. 03/2018 Cath: LAD and LCX with mild luminal irregularities.  No significant disease.   Iron deficiency anemia, unspecified    Lumbosacral spondylosis without myelopathy    Muscle weakness (generalized)    Other and unspecified disc disorder of cervical region    Other and unspecified hyperlipidemia    Patent foramen ovale    a. 05/2018 Echo: post-op TAVR--> + bublble study w/ L->R shunt.   Permanent atrial fibrillation (HCC)    a.  Diagnosed in the spring 2019.  Had rapid atrial fibrillation following TAVR and has been rate controlled with beta-blocker.  CHA2DS2VASc equals 7.  Supposed to be on Eliquis.   Primary localized osteoarthrosis, lower leg    Primary localized osteoarthrosis, shoulder region    Reflux esophagitis    Severe aortic stenosis    a. mild-mod AS by 01/2016 TTE, restricted AV opening with mod AR 01/2016 TEE; b. 10/2017 Echo: EF 60-65%, sev Ca2+ AoV, mean grad ; c. 05/2018 s/p TAVR ([facility]); d. 06/2018 Echo: EF 55-60%, well-seated prosth AoV, peak velocity 156cm/s. AoV gradient .   Spinal stenosis, unspecified region other than cervical    Stroke (HCC)    01/2016   Syncope and collapse    Unspecified arthropathy, lower leg    Unspecified constipation    Unspecified essential hypertension    Unspecified glaucoma(365.9)     Past Surgical History:  Procedure Laterality Date   BILATERAL CATARACT SURGERY  2009   DR GROAT    BIOPSY  10/14/2018   Procedure: BIOPSY;  Surgeon: Graylin Shiver, MD;  Location[facility] ENDOSCOPY;  Service: Endoscopy;;   CARDIAC VALVE REPLACEMENT  05/2018   CERVIACAL SPINE (4X)     DR MARK ROY    COLONOSCOPY  2007   DR HENSEL    COLONOSCOPY WITH PROPOFOL N/A 11/20/2017   Procedure: COLONOSCOPY WITH PROPOFOL;  Surgeon: Charlott Rakes, MD;  Location: [facility] ENDOSCOPY;   Service: Endoscopy;  Laterality: N/A;   ESOPHAGOGASTRODUODENOSCOPY (EGD) WITH PROPOFOL N/A 10/14/2018   Procedure: ESOPHAGOGASTRODUODENOSCOPY (EGD) WITH PROPOFOL;  Surgeon: Graylin Shiver, MD;  Location: [facility] ENDOSCOPY;  Service: Endoscopy;  Laterality: N/A;   JOINT REPLACEMENT     both knees   LEFT KNEE REPLACEMENT     DR ALUSIO   LEFT TRANSVERSE CARPAL LIGAMENT  01/06/2008   ROTATOR CUFF LEFT SHOULDER  2001   DR MURPHY    SHOULDER OPEN ROTATOR CUFF REPAIR  2006   DR [facility]   SPINE SURGERY     neck fusion     reports that he quit smoking about 38 years ago. His smoking use included cigarettes. He quit smokeless tobacco use about 38 years ago. He reports that he does not drink alcohol and does not use drugs.  Allergies  Allergen Reactions   Lorazepam Other (See Comments)    Delirium   Duloxetine Other (See Comments)    Increased depression, irritability   Pregabalin Swelling and Other (See Comments)    Flashbacks, nightmares, aggression   Zanaflex [Tizanidine Hcl] Swelling and Anxiety    Family History  Problem Relation Age of Onset   Cancer Mother 66       cervical cancer   Cancer Father 72       lung  cancer    Prior to Admission medications   Medication Sig Start Date End Date Taking? Authorizing Provider  aspirin EC 81 MG tablet Take 81 mg by mouth daily.    [provider]  buPROPion (WELLBUTRIN) 100 MG tablet Take 100 mg by mouth 2 (two) times daily.    [provider]  cyanocobalamin (,VITAMIN B-12,) 1000 MCG/ML injection Inject 1 mL into the muscle every 28 (twenty-eight) days. 03/15/20   [provider]  diltiazem (CARDIZEM CD) 120 MG 24 hr capsule Take 1 capsule (120 mg total) by mouth daily. 10/09/19   Kathlen Mody, MD  ferrous sulfate 325 (65 FE) MG tablet Take 325 mg by mouth daily with breakfast. 03/15/20   [provider]  furosemide (LASIX) 20 MG tablet Take 20 mg by mouth daily. May take one additional tablet as needed  for leg swelling.    [provider]  gabapentin (NEURONTIN) 400 MG capsule Take 1 capsule (400 mg total) by mouth 3 (three) times daily. 12/03/23   Marinda Elk, MD  metoprolol succinate (TOPROL-XL) 25 MG 24 hr tablet Take 25 mg by mouth daily.    [provider]  oxyCODONE-acetaminophen (PERCOCET) 10-325 MG tablet Take 1 tablet by mouth 5 (five) times daily as needed for pain.    [provider]  polyethylene glycol (MIRALAX / GLYCOLAX) packet Take 17 g by mouth daily as needed for mild constipation.    [provider]  potassium chloride (K-DUR,KLOR-CON) 20 MEQ tablet Take 1 tablet (20 mEq total) by mouth daily. Patient taking differently: Take 20 mEq by mouth at bedtime. 09/20/18   Iran Ouch, [place]  M, PA-C  rOPINIRole (REQUIP) 0.25 MG tablet Take 0.25 mg by mouth 2 (two) times daily.  10/09/18   [provider]  silver sulfADIAZINE (SILVADENE) 1 % cream Apply 1 Application topically. 11/25/23   [provider]  simvastatin (ZOCOR) 40 MG tablet Take 40 mg by mouth at bedtime.    [provider]  tamsulosin (FLOMAX) 0.4 MG CAPS capsule Take 0.8 mg by mouth daily. 09/02/19   [provider]    Physical Exam: Vitals:   01/29/24 2038 01/29/24 2201 01/30/24 0000 01/30/24 0043  BP: (!) 154/99 127/83 (!) 144/94 (!) 131/91  Pulse: (!) 132 (!) 108 (!) 107 (!) 108  Resp: (!) 25 17 18 19   Temp: (!) 97.4 F (36.3 C)     TempSrc: Oral     SpO2: 100% 91% 97% 93%  Weight:      Height:        Constitutional: NAD, calm, comfortable Vitals:   01/29/24 2038 01/29/24 2201 01/30/24 0000 01/30/24 0043  BP: (!) 154/99 127/83 (!) 144/94 (!) 131/91  Pulse: (!) 132 (!) 108 (!) 107 (!) 108  Resp: (!) 25 17 18 19   Temp: (!) 97.4 F (36.3 C)     TempSrc: Oral     SpO2: 100% 91% 97% 93%  Weight:      Height:       Eyes: PERRL, lids and conjunctivae normal ENMT: Mucous membranes are moist. Posterior pharynx clear of any  exudate or lesions.Normal dentition.  Neck: normal, supple, no masses, no thyromegaly Respiratory: clear to auscultation bilaterally, no wheezing, no crackles. Normal respiratory effort. No accessory muscle use.  Cardiovascular: Regular rate and rhythm, no murmurs / rubs / gallops. No extremity edema. 2+ pedal pulses.  Abdomen: no tenderness, no masses palpated. No hepatosplenomegaly. Bowel sounds positive.  Musculoskeletal: no clubbing / cyanosis. No joint deformity upper and lower extremities. Good ROM, no contractures. Normal muscle tone.  Skin: no rashes, lesions, ulcers. No induration Neurologic: CN 2-12 grossly intact. Sensation intact,Strength 5/5 in all 4.  Psychiatric: Normal judgment and insight. Alert and oriented x 3. Normal mood.    Labs on Admission: I have personally reviewed following labs and imaging studies  CBC: Recent Labs  Lab 01/29/24 2046  WBC 10.4  HGB 14.4  HCT 42.7  MCV 97.3  PLT 242   Basic Metabolic Panel: Recent Labs  Lab 01/29/24 2046  NA 137  K 3.9  CL 107  CO2 20*  GLUCOSE 81  BUN 17  CREATININE 0.85  CALCIUM 8.0*   GFR: Estimated Creatinine Clearance: 75.9 mL/min (by C-G formula based on SCr of 0.85 mg/dL). Liver Function Tests: Recent Labs  Lab 01/29/24 2046  AST 25  ALT 23  ALKPHOS 71  BILITOT 1.2  PROT 6.2*  ALBUMIN 3.0*   No results for input(s): "LIPASE", "AMYLASE" in the last 168 hours. No results for input(s): "AMMONIA" in the last 168 hours. Coagulation Profile: No results for input(s): "INR", "PROTIME" in the last 168 hours. Cardiac Enzymes: No results for input(s): "CKTOTAL", "CKMB", "CKMBINDEX", "TROPONINI" in the last 168 hours. BNP (last 3 results) No results for input(s): "PROBNP" in the last 8760 hours. HbA1C: No results for input(s): "HGBA1C" in the last 72 hours. CBG: No results for input(s): "GLUCAP" in the last 168 hours. Lipid Profile: No results for input(s): "CHOL", "HDL", "LDLCALC", "TRIG",  "CHOLHDL", "LDLDIRECT" in the last 72 hours. Thyroid Function Tests: No results for input(s): "TSH", "T4TOTAL", "FREET4", "T3FREE", "THYROIDAB" in the last 72  hours. Anemia Panel: No results for input(s): "VITAMINB12", "FOLATE", "FERRITIN", "TIBC", "IRON", "RETICCTPCT" in the last 72 hours. Urine analysis:    Component Value Date/Time   COLORURINE YELLOW 01/29/2024 2122   APPEARANCEUR CLEAR 01/29/2024 2122   LABSPEC 1.024 01/29/2024 2122   PHURINE 7.0 01/29/2024 2122   GLUCOSEU NEGATIVE 01/29/2024 2122   HGBUR NEGATIVE 01/29/2024 2122   HGBUR negative 04/08/2007 1327   BILIRUBINUR NEGATIVE 01/29/2024 2122   KETONESUR 5 (A) 01/29/2024 2122   PROTEINUR NEGATIVE 01/29/2024 2122   UROBILINOGEN 0.2 04/24/2010 1617   NITRITE NEGATIVE 01/29/2024 2122   LEUKOCYTESUR NEGATIVE 01/29/2024 2122    Radiological Exams on Admission: CT Angio Chest PE W and/or Wo Contrast Result Date: 01/29/2024 CLINICAL DATA:  Left lower quadrant abdominal pain. PE suspected. Chest pain and shortness of breath. EXAM: CT ANGIOGRAPHY CHEST CT ABDOMEN AND PELVIS WITH CONTRAST TECHNIQUE: Multidetector CT imaging of the chest was performed using the standard protocol during bolus administration of intravenous contrast. Multiplanar CT image reconstructions and MIPs were obtained to evaluate the vascular anatomy. Multidetector CT imaging of the abdomen and pelvis was performed using the standard protocol during bolus administration of intravenous contrast. RADIATION DOSE REDUCTION: This exam was performed according to the departmental dose-optimization program which includes automated exposure control, adjustment of the mA and/or kV according to patient size and/or use of iterative reconstruction technique. CONTRAST:  75mL OMNIPAQUE IOHEXOL 350 MG/ML SOLN COMPARISON:  Same day chest radiograph and CT chest abdomen and pelvis 11/30/2023 FINDINGS: CTA CHEST FINDINGS Cardiovascular: Negative for acute pulmonary embolism. TAVR.  Mitral annular calcification. No pericardial effusion. Mediastinum/Nodes: Trachea and esophagus are unremarkable. No thoracic adenopathy. Lungs/Pleura: Mosaic attenuation of the lungs. Diffuse bronchial wall thickening. Mucous plugging in the lower lobes. Clustered nodularity in the posterior right lower lobe compatible with small airway infection/inflammation. Pleural effusion or pneumothorax. Musculoskeletal: No acute fracture. Advanced thoracic spondylosis with bulky anterior osteophytes. Posterior fusion T11-T12. remote left rib fractures. Review of the MIP images confirms the above findings. CT ABDOMEN and PELVIS FINDINGS Hepatobiliary: No acute abnormality. Pancreas: Unremarkable. Spleen: Unremarkable. Adrenals/Urinary Tract: Stable adrenal glands. Bilateral cortical renal scarring and renal cysts. No follow-up recommended. Nonobstructing stone versus parenchymal calcification in the right kidney. No hydronephrosis. Multiple bladder diverticula. Stomach/Bowel: Postoperative change about the stomach. Normal caliber large and small bowel. No bowel wall thickening. Normal appendix. Vascular/Lymphatic: Aortic atherosclerosis. No enlarged abdominal or pelvic lymph nodes. Reproductive: Unremarkable. Other: No free intraperitoneal fluid or air. Unchanged ventral abdominal wall fat containing hernias. Fat containing inguinal hernias. Musculoskeletal: No acute fracture. Review of the MIP images confirms the above findings. IMPRESSION: 1. Negative for acute pulmonary embolism. 2. Small airway infection/inflammation. 3. No acute abnormality in the abdomen or pelvis. 4. Aortic Atherosclerosis (ICD10-I70.0). Electronically Signed   By: Minerva Fester M.D.   On: 01/29/2024 23:00   CT ABDOMEN PELVIS W CONTRAST Result Date: 01/29/2024 CLINICAL DATA:  Left lower quadrant abdominal pain. PE suspected. Chest pain and shortness of breath. EXAM: CT ANGIOGRAPHY CHEST CT ABDOMEN AND PELVIS WITH CONTRAST TECHNIQUE: Multidetector  CT imaging of the chest was performed using the standard protocol during bolus administration of intravenous contrast. Multiplanar CT image reconstructions and MIPs were obtained to evaluate the vascular anatomy. Multidetector CT imaging of the abdomen and pelvis was performed using the standard protocol during bolus administration of intravenous contrast. RADIATION DOSE REDUCTION: This exam was performed according to the departmental dose-optimization program which includes automated exposure control, adjustment of the mA and/or kV according to patient  size and/or use of iterative reconstruction technique. CONTRAST:  75mL OMNIPAQUE IOHEXOL 350 MG/ML SOLN COMPARISON:  Same day chest radiograph and CT chest abdomen and pelvis 11/30/2023 FINDINGS: CTA CHEST FINDINGS Cardiovascular: Negative for acute pulmonary embolism. TAVR. Mitral annular calcification. No pericardial effusion. Mediastinum/Nodes: Trachea and esophagus are unremarkable. No thoracic adenopathy. Lungs/Pleura: Mosaic attenuation of the lungs. Diffuse bronchial wall thickening. Mucous plugging in the lower lobes. Clustered nodularity in the posterior right lower lobe compatible with small airway infection/inflammation. Pleural effusion or pneumothorax. Musculoskeletal: No acute fracture. Advanced thoracic spondylosis with bulky anterior osteophytes. Posterior fusion T11-T12. remote left rib fractures. Review of the MIP images confirms the above findings. CT ABDOMEN and PELVIS FINDINGS Hepatobiliary: No acute abnormality. Pancreas: Unremarkable. Spleen: Unremarkable. Adrenals/Urinary Tract: Stable adrenal glands. Bilateral cortical renal scarring and renal cysts. No follow-up recommended. Nonobstructing stone versus parenchymal calcification in the right kidney. No hydronephrosis. Multiple bladder diverticula. Stomach/Bowel: Postoperative change about the stomach. Normal caliber large and small bowel. No bowel wall thickening. Normal appendix.  Vascular/Lymphatic: Aortic atherosclerosis. No enlarged abdominal or pelvic lymph nodes. Reproductive: Unremarkable. Other: No free intraperitoneal fluid or air. Unchanged ventral abdominal wall fat containing hernias. Fat containing inguinal hernias. Musculoskeletal: No acute fracture. Review of the MIP images confirms the above findings. IMPRESSION: 1. Negative for acute pulmonary embolism. 2. Small airway infection/inflammation. 3. No acute abnormality in the abdomen or pelvis. 4. Aortic Atherosclerosis (ICD10-I70.0). Electronically Signed   By: Minerva Fester M.D.   On: 01/29/2024 23:00   DG Chest Portable 1 View Result Date: 01/29/2024 CLINICAL DATA:  Chest pain or shortness of breath EXAM: PORTABLE CHEST 1 VIEW COMPARISON:  12/01/2023 FINDINGS: Stable cardiomegaly. Aortic atherosclerotic calcification. TAVR. Low lung volumes accentuate pulmonary vascularity. Left basilar atelectasis. No definite pleural effusion. No pneumothorax. Cervical and thoracic spine fusion hardware. No displaced rib fractures. IMPRESSION: Low lung volumes with left basilar atelectasis. Electronically Signed   By: Minerva Fester M.D.   On: 01/29/2024 21:09    EKG: Independently reviewed. See above  Assessment/Plan   Afib RVR  -admit to cardiac tele  -continue on ccb drip  - resume oral metoprolol  -wean drip as able  - cardiology consult if patient unable to be weaned from ccb  - update echo in am  -cycle ce to be complete  -check uds to be complete -continue on eliquis  -wean drip as able  CHFpef -on prn lasix as outpatient   Essential hypertension -stable continue on metoprolol  - on ccb drip   CAD  Aortic stenosis status post TAVR -stable  -f/u echo pending   Hx of CVA -on eliquis   Chronic back pain with multiple back surgeries  -supportive care  -continue on chronic pain regimen    DVT prophylaxis: Eliquis Code Status: full/ as discussed per patient wishes in event of cardiac  arrest  Family Communication: none at bedside Disposition Plan: patient  expected to be admitted greater than 2 midnights  Consults called: consider cardiology in am if not able to be weaned from drip Admission status: progressive /cardiac tele   Lurline Del MD [facility]   If 7PM-7AM, please contact night-coverage [email or web address] Password [facility]  01/30/2024, 12:52 AM

## 2024-01-30 NOTE — Plan of Care (Signed)
  Problem: Clinical Measurements: Goal: Will remain free from infection Outcome: Progressing Goal: Diagnostic test results will improve Outcome: Progressing   Problem: Activity: Goal: Risk for activity intolerance will decrease Outcome: Progressing   Problem: Nutrition: Goal: Adequate nutrition will be maintained Outcome: Progressing   Problem: Coping: Goal: Level of anxiety will decrease Outcome: Progressing   Problem: Elimination: Goal: Will not experience complications related to urinary retention Outcome: Progressing

## 2024-01-30 NOTE — Progress Notes (Signed)
This is a 83 year old gentleman with history of hypertension, CAD, aortic stenosis status post TAVR, PAF on Eliquis, history of CVA and back pain with multiple back surgeries who came to the ED with chest pain/abdominal pain and shortness of breath for 1 day.  Was found to have atrial fibrillation with RVR in the ED, started on Cardizem drip.  Admitted to hospital service after midnight.  Patient seen and examined in the ED.  His major complaint is abdominal pain which is going on for 2 days.  Denies any problem with urination or with bowel movements or any nausea or vomiting or fever or chills.  Able to tolerate diet.  On examination, lungs clear to auscultation.  Still on Cardizem drip going on at 10 mg/h.  His metoprolol is resumed.  He just received that.  I have resumed his p.o. Cardizem and have requested the nurse to wean his Cardizem to 5 mg after an hour and continue to wean and perhaps stop with the goal to keep heart rate<110 since patient is asymptomatic.  If needed, I will increase his Toprol-XL/give him extra dose today.  Continue Eliquis.  No need for cardiology if we are able to wean him off of Cardizem drip.  For his abdominal pain, he is having mild periumbilical tenderness.  He ended up having CT abdomen pelvis which was unremarkable/no acute pathology.  He also had CT PE study and PE was ruled out.  Ruled out of respiratory viral panel infections as well as COVID, flu and RSV.  Troponins negative so far. I spent 35 minutes on care of this patient.

## 2024-01-30 NOTE — Progress Notes (Signed)
  Echocardiogram 2D Echocardiogram has been performed.  Delcie Roch 01/30/2024, 4:38 PM

## 2024-01-30 NOTE — ED Notes (Signed)
Patient sister called listed in contact wanting an update

## 2024-01-31 DIAGNOSIS — I4891 Unspecified atrial fibrillation: Secondary | ICD-10-CM | POA: Diagnosis not present

## 2024-01-31 LAB — CBC WITH DIFFERENTIAL/PLATELET
Abs Immature Granulocytes: 0.03 10*3/uL (ref 0.00–0.07)
Basophils Absolute: 0 10*3/uL (ref 0.0–0.1)
Basophils Relative: 0 %
Eosinophils Absolute: 0.2 10*3/uL (ref 0.0–0.5)
Eosinophils Relative: 2 %
HCT: 40.9 % (ref 39.0–52.0)
Hemoglobin: 13.4 g/dL (ref 13.0–17.0)
Immature Granulocytes: 0 %
Lymphocytes Relative: 13 %
Lymphs Abs: 1.3 10*3/uL (ref 0.7–4.0)
MCH: 32.1 pg (ref 26.0–34.0)
MCHC: 32.8 g/dL (ref 30.0–36.0)
MCV: 97.8 fL (ref 80.0–100.0)
Monocytes Absolute: 1.1 10*3/uL — ABNORMAL HIGH (ref 0.1–1.0)
Monocytes Relative: 11 %
Neutro Abs: 7.3 10*3/uL (ref 1.7–7.7)
Neutrophils Relative %: 74 %
Platelets: 223 10*3/uL (ref 150–400)
RBC: 4.18 MIL/uL — ABNORMAL LOW (ref 4.22–5.81)
RDW: 15.2 % (ref 11.5–15.5)
WBC: 10.1 10*3/uL (ref 4.0–10.5)
nRBC: 0 % (ref 0.0–0.2)

## 2024-01-31 LAB — BASIC METABOLIC PANEL
Anion gap: 9 (ref 5–15)
BUN: 24 mg/dL — ABNORMAL HIGH (ref 8–23)
CO2: 25 mmol/L (ref 22–32)
Calcium: 8.9 mg/dL (ref 8.9–10.3)
Chloride: 101 mmol/L (ref 98–111)
Creatinine, Ser: 1.08 mg/dL (ref 0.61–1.24)
GFR, Estimated: >60 mL/min (ref >60)
Glucose, Bld: 112 mg/dL — ABNORMAL HIGH (ref 70–99)
Potassium: 3.8 mmol/L (ref 3.5–5.1)
Sodium: 135 mmol/L (ref 135–145)

## 2024-01-31 NOTE — Progress Notes (Signed)
Discharge instructions (including medications) discussed with and copy provided to patient/caregiver  Spoke to Ms Overman/gardian and she is on her way to take patient home

## 2024-01-31 NOTE — Evaluation (Signed)
Occupational Therapy Evaluation Patient Details Name: [name] MRN: [id number] DOB: [birth year] Today's Date: 01/31/2024   History of Present Illness   83 y.o. male admitted with chest pain/abdominal pain and shortness of breath. Pt with Afib RVR.  PMH recent fall with rib fx, L PTX, Afib, CVA, GIB, diastolic heart failure, chronic back pain w/ multiple back surgeries, memory deficits, ataxia     Clinical Impressions PTA pt lives independently with his sister, exercises daily and drives. Pt close to baseline level of function regarding ADL and functional mobility for ADL using his cane. Pt states he has a RW to use if needed. Pt had a recent fall outside PTA - discussed with PT who can determine if outpt PT needed to address balance issues. VSS on RA throughout session. OT signing off.      If plan is discharge home, recommend the following:   Assistance with cooking/housework     Functional Status Assessment   Patient has not had a recent decline in their functional status     Equipment Recommendations   Other (comment)     Recommendations for Other Services   PT consult     Precautions/Restrictions   Precautions Precautions: Fall Restrictions Weight Bearing Restrictions Per Provider Order: No     Mobility Bed Mobility                    Transfers Overall transfer level: Needs assistance   Transfers: Sit to/from Stand Sit to Stand: Supervision                  Balance Overall balance assessment: Needs assistance, History of Falls Sitting-balance support: Feet supported Sitting balance-Leahy Scale: Good       Standing balance-Leahy Scale: Fair                             ADL either performed or assessed with clinical judgement   ADL Overall ADL's : At baseline                                             Vision Baseline Vision/History: 1 Wears glasses Patient Visual Report: No change from  baseline Vision Assessment?: Wears glasses for reading     Perception         Praxis         Pertinent Vitals/Pain Pain Assessment Pain Assessment: 0-10 Pain Score: 3  Pain Location: back Pain Descriptors / Indicators:  (chronic) Pain Intervention(s): Limited activity within patient's tolerance     Extremity/Trunk Assessment Upper Extremity Assessment Upper Extremity Assessment: Overall WFL for tasks assessed (limited B shoulder flex at baseline due to apparent RTC insufficiency)   Lower Extremity Assessment Lower Extremity Assessment: Defer to PT evaluation   Cervical / Trunk Assessment Cervical / Trunk Assessment: Back Surgery;Neck Surgery (limited cervical rotation)   Communication Communication Communication: No apparent difficulties (verbose)   Cognition Arousal: Alert Behavior During Therapy: WFL for tasks assessed/performed Cognition: No apparent impairments             OT - Cognition Comments: memory deficits at baseline                 Following commands: Intact       Cueing  General Comments          Exercises  Shoulder Instructions      Home Living Family/patient expects to be discharged to:: Private residence Living Arrangements: Spouse/significant other (with sister) Available Help at Discharge: Family;Available PRN/intermittently Type of Home: House Home Access: Level entry     Home Layout: One level     Bathroom Shower/Tub: Producer, television/film/video: Handicapped height Bathroom Accessibility: Yes How Accessible: Accessible via walker Home Equipment: Grab bars - toilet;Grab bars - tub/shower;Wheelchair - Forensic psychologist (2 wheels);Cane - single point;Lift chair;Shower seat - built in          Prior Functioning/Environment Prior Level of Function : Independent/Modified Independent;History of Falls (last six months);Driving             Mobility Comments: mod I w/ cane, reports 1 fall outsdie last  week; able to get up from ground independently ADLs Comments: independent ADLs, drives, manages iADLs (report stopping his meds for the last 4 days PTA)    OT Problem List: Cardiopulmonary status limiting activity   OT Treatment/Interventions:        OT Goals(Current goals can be found in the care plan section)   Acute Rehab OT Goals Patient Stated Goal: home today OT Goal Formulation: All assessment and education complete, DC therapy   OT Frequency:       Co-evaluation              AM-PAC OT "6 Clicks" Daily Activity     Outcome Measure Help from another person eating meals?: None Help from another person taking care of personal grooming?: None Help from another person toileting, which includes using toliet, bedpan, or urinal?: None Help from another person bathing (including washing, rinsing, drying)?: None Help from another person to put on and taking off regular upper body clothing?: None Help from another person to put on and taking off regular lower body clothing?: None 6 Click Score: 24   End of Session Equipment Utilized During Treatment: Gait belt;Other (comment) (cane) Nurse Communication: Mobility status  Activity Tolerance: Patient tolerated treatment well Patient left: in chair;with call bell/phone within reach;with chair alarm set  OT Visit Diagnosis: Unsteadiness on feet (R26.81)                Time: 1000-1022 OT Time Calculation (min): 22 min Charges:  OT General Charges $OT Visit: 1 Visit OT Evaluation $OT Eval Low Complexity: 1 Low  Dekari Bures, OT/L   Acute OT Clinical Specialist Acute Rehabilitation Services Pager [phone] Office [phone]   [facility] 01/31/2024, 11:01 AM

## 2024-01-31 NOTE — Plan of Care (Signed)

## 2024-01-31 NOTE — Discharge Instructions (Signed)

## 2024-01-31 NOTE — Discharge Summary (Signed)
 Physician Discharge Summary  [name] XBM:[id number] DOB: 1941/02/05 DOA: 01/29/2024  PCP: Dois Davenport, MD  Admit date: 01/29/2024 Discharge date: 01/31/2024 30 Day Unplanned Readmission Risk Score    Flowsheet Row ED to Hosp-Admission (Current) from 01/29/2024 in  2C [facility] PROGRESSIVE CARE  30 Day Unplanned Readmission Risk Score (%) 19.89 Filed at 01/31/2024 0801       This score is the patient's risk of an unplanned readmission within 30 days of being discharged (0 -100%). The score is based on dignosis, age, lab data, medications, orders, and past utilization.   Low:  0-14.9   Medium: 15-21.9   High: 22-29.9   Extreme: 30 and above          Admitted From: Home Disposition: Home  Recommendations for Outpatient Follow-up:  Follow up with PCP in 1-2 weeks Please obtain BMP/CBC in one week Please follow up with your PCP on the following pending results: Unresulted Labs (From admission, onward)    None         Home Health: None Equipment/Devices: None  Discharge Condition: Stable CODE STATUS: Full code Diet recommendation: Cardiac  Subjective: Seen and examined.  No complaints.  He denies any abdominal pain.  He is excited about going home today.  Brief/Interim Summary: This is a 83 year old gentleman with history of hypertension, CAD, aortic stenosis status post TAVR, PAF on Eliquis, history of CVA and back pain with multiple back surgeries who came to the ED with chest pain/abdominal pain and shortness of breath for 1 day.  Was found to have atrial fibrillation with RVR in the ED, started on Cardizem drip.  Eventually started on oral beta-blocker as well as resumed on home oral Cardizem and weaned off of Cardizem drip, rates have remained under control.  His major complaint is abdominal pain which is going on for 2 days.  Denies any problem with urination or with bowel movements or any nausea or vomiting or fever or chills.  Able to tolerate diet.  He ended  up having CT abdomen pelvis which was unremarkable/no acute pathology.  He also had CT PE study and PE was ruled out.  Ruled out of respiratory viral panel infections as well as COVID, flu and RSV.  Troponins negative so far.  Patient appears to be stable with no abdominal pain today.  Rest of the medical issues remained stable as well.  He also had mild high anion gap metabolic acidosis which resolved.  Also mild leukocytosis which was nonspecific and has resolved as well.  Patient will resume all his home medications.  He was seen by PT OT, no needs were identified or recommended.  Discharge plan was discussed with patient and/or family member and they verbalized understanding and agreed with it.  Discharge Diagnoses:  Principal Problem:   Atrial fibrillation with RVR (HCC) Active Problems:   HYPERCHOLESTEROLEMIA   HYPERTENSION, BENIGN    Discharge Instructions  Discharge Instructions     Amb referral to [facility]   Complete by: As directed       Allergies as of 01/31/2024       Reactions   Lorazepam Other (See Comments)   Delirium   Duloxetine Other (See Comments)   Increased depression, irritability   Pregabalin Swelling, Other (See Comments)   Flashbacks, nightmares, aggression   Zanaflex [tizanidine Hcl] Swelling, Anxiety        Medication List     TAKE these medications    apixaban 5 MG Tabs tablet Commonly  known as: ELIQUIS Take 5 mg by mouth 2 (two) times daily.   aspirin EC 81 MG tablet Take 81 mg by mouth daily.   buPROPion 100 MG tablet Commonly known as: WELLBUTRIN Take 100 mg by mouth 2 (two) times daily.   cyanocobalamin 1000 MCG/ML injection Commonly known as: VITAMIN B12 Inject 1 mL into the muscle every 28 (twenty-eight) days.   diltiazem 120 MG 24 hr capsule Commonly known as: CARDIZEM CD Take 1 capsule (120 mg total) by mouth daily.   ferrous sulfate 325 (65 FE) MG tablet Take 325 mg by mouth daily with breakfast.   fluticasone 50  MCG/ACT nasal spray Commonly known as: FLONASE Place 1 spray into both nostrils as directed.   furosemide 20 MG tablet Commonly known as: LASIX Take 20 mg by mouth daily. May take one additional tablet as needed for leg swelling.   gabapentin 400 MG capsule Commonly known as: NEURONTIN Take 1 capsule (400 mg total) by mouth 3 (three) times daily.   gabapentin 800 MG tablet Commonly known as: NEURONTIN Take 800 mg by mouth 3 (three) times daily.   lidocaine 5 % Commonly known as: LIDODERM Place 1 patch onto the skin daily.   metoprolol succinate 25 MG 24 hr tablet Commonly known as: TOPROL-XL Take 25 mg by mouth daily.   oxyCODONE-acetaminophen 10-325 MG tablet Commonly known as: PERCOCET Take 1 tablet by mouth 5 (five) times daily as needed for pain.   polyethylene glycol 17 g packet Commonly known as: MIRALAX / GLYCOLAX Take 17 g by mouth daily as needed for mild constipation.   potassium chloride SA 20 MEQ tablet Commonly known as: KLOR-CON M Take 1 tablet (20 mEq total) by mouth daily. What changed: when to take this   rOPINIRole 0.25 MG tablet Commonly known as: REQUIP Take 0.25 mg by mouth 2 (two) times daily.   silver sulfADIAZINE 1 % cream Commonly known as: SILVADENE Apply 1 Application topically.   simvastatin 40 MG tablet Commonly known as: ZOCOR Take 40 mg by mouth at bedtime.   spironolactone 25 MG tablet Commonly known as: ALDACTONE Take 25 mg by mouth daily.   tamsulosin 0.4 MG Caps capsule Commonly known as: FLOMAX Take 0.8 mg by mouth daily.   topiramate 25 MG capsule Commonly known as: TOPAMAX Take 25 mg by mouth 2 (two) times daily.        Follow-up Information     Dois Davenport, MD Follow up in 1 week(s).   Specialty: Family Medicine Contact information: [address] [place] [address] [place] [address] [phone]                Allergies  Allergen Reactions   Lorazepam Other (See Comments)    Delirium    Duloxetine Other (See Comments)    Increased depression, irritability   Pregabalin Swelling and Other (See Comments)    Flashbacks, nightmares, aggression   Zanaflex [Tizanidine Hcl] Swelling and Anxiety    Consultations: None   Procedures/Studies: ECHOCARDIOGRAM COMPLETE Result Date: 01/30/2024    ECHOCARDIOGRAM REPORT   Patient Name:   JAMESYN LINDELL Date of Exam: 01/30/2024 Medical Rec #:  [id number]     Height:       69.0 in Accession #:    [id number]    Weight:       210.5 lb Date of Birth:  06-27-1941     BSA:          2.111 m Patient Age:  82 years      BP:           109/67 mmHg Patient Gender: M             HR:           84 bpm. Exam Location:  Inpatient Procedure: 2D Echo, Cardiac Doppler and Color Doppler (Both Spectral and Color            Flow Doppler were utilized during procedure). Indications:    atrial fibrillation  History:        Patient has prior history of Echocardiogram examinations, most                 recent 04/25/2010. CAD, Signs/Symptoms:Shortness of Breath; Risk                 Factors:Hypertension.                 Aortic Valve: 34 CoreValve-EvolutR prosthetic, stented (TAVR)                 valve is present in the aortic position. Procedure Date: 05/2018.  Sonographer:    Delcie Roch RDCS Referring Phys: [id number] SARA-MAIZ A THOMAS  Sonographer Comments: Image acquisition challenging due to respiratory motion and unable to lie still comfortably. IMPRESSIONS  1. Left ventricular ejection fraction, by estimation, is 65 to 70%. The left ventricle has normal function. The left ventricle has no regional wall motion abnormalities. There is mild concentric left ventricular hypertrophy. Left ventricular diastolic function could not be evaluated.  2. Right ventricular systolic function is normal. The right ventricular size is normal. There is normal pulmonary artery systolic pressure.  3. Left atrial size was severely dilated.  4. Right atrial size was severely dilated.  5. The  mitral valve is normal in structure. No evidence of mitral valve regurgitation. No evidence of mitral stenosis.  6. The aortic valve has been repaired/replaced. Aortic valve regurgitation is not visualized. No aortic stenosis is present. There is a 34 CoreValve-EvolutR prosthetic (TAVR) valve present in the aortic position. Procedure Date: 05/2018. Echo findings are consistent with normal structure and function of the aortic valve prosthesis. Aortic valve area, by VTI measures 1.69 cm. Aortic valve mean gradient measures 4.6 mmHg. Aortic valve Vmax measures 1.46 m/s.  7. The inferior vena cava is normal in size with greater than 50% respiratory variability, suggesting right atrial pressure of 3 mmHg. FINDINGS  Left Ventricle: Left ventricular ejection fraction, by estimation, is 65 to 70%. The left ventricle has normal function. The left ventricle has no regional wall motion abnormalities. Strain imaging was not performed. The left ventricular internal cavity  size was normal in size. There is mild concentric left ventricular hypertrophy. Left ventricular diastolic function could not be evaluated due to atrial fibrillation. Left ventricular diastolic function could not be evaluated. Right Ventricle: The right ventricular size is normal. No increase in right ventricular wall thickness. Right ventricular systolic function is normal. There is normal pulmonary artery systolic pressure. The tricuspid regurgitant velocity is 2.46 m/s, and  with an assumed right atrial pressure of 3 mmHg, the estimated right ventricular systolic pressure is 27.2 mmHg. Left Atrium: Left atrial size was severely dilated. Right Atrium: Right atrial size was severely dilated. Pericardium: There is no evidence of pericardial effusion. Mitral Valve: The mitral valve is normal in structure. No evidence of mitral valve regurgitation. No evidence of mitral valve stenosis. MV peak gradient, 7.3 mmHg. The mean mitral valve gradient is  3.0 mmHg.  Tricuspid Valve: The tricuspid valve is normal in structure. Tricuspid valve regurgitation is mild . No evidence of tricuspid stenosis. Aortic Valve: The aortic valve has been repaired/replaced. Aortic valve regurgitation is not visualized. No aortic stenosis is present. Aortic valve mean gradient measures 4.6 mmHg. Aortic valve peak gradient measures 8.5 mmHg. Aortic valve area, by VTI measures 1.69 cm. There is a 34 CoreValve-EvolutR prosthetic, stented (TAVR) valve present in the aortic position. Procedure Date: 05/2018. Echo findings are consistent with normal structure and function of the aortic valve prosthesis. Pulmonic Valve: The pulmonic valve was normal in structure. Pulmonic valve regurgitation is not visualized. No evidence of pulmonic stenosis. Aorta: The aortic root is normal in size and structure. Venous: The inferior vena cava is normal in size with greater than 50% respiratory variability, suggesting right atrial pressure of 3 mmHg. IAS/Shunts: No atrial level shunt detected by color flow Doppler. Additional Comments: 3D imaging was not performed.  LEFT VENTRICLE PLAX 2D LVIDd:         4.00 cm LVIDs:         2.70 cm LV PW:         1.10 cm LV IVS:        1.00 cm LVOT diam:     2.00 cm LV SV:         40 LV SV Index:   19 LVOT Area:     3.14 cm  RIGHT VENTRICLE          IVC RV Basal diam:  3.00 cm  IVC diam: 1.60 cm LEFT ATRIUM             Index        RIGHT ATRIUM           Index LA diam:        4.20 cm 1.99 cm/m   RA Area:     24.90 cm LA Vol (A2C):   89.0 ml 42.15 ml/m  RA Volume:   73.00 ml  34.57 ml/m LA Vol (A4C):   83.3 ml 39.45 ml/m LA Biplane Vol: 87.5 ml 41.44 ml/m  AORTIC VALVE AV Area (Vmax):    1.67 cm AV Area (Vmean):   1.60 cm AV Area (VTI):     1.69 cm AV Vmax:           146.00 cm/s AV Vmean:          97.360 cm/s AV VTI:            0.234 m AV Peak Grad:      8.5 mmHg AV Mean Grad:      4.6 mmHg LVOT Vmax:         77.82 cm/s LVOT Vmean:        49.700 cm/s LVOT VTI:           0.126 m LVOT/AV VTI ratio: 0.54 MITRAL VALVE             TRICUSPID VALVE MV Area VTI:  1.37 cm   TR Peak grad:   24.2 mmHg MV Peak grad: 7.3 mmHg   TR Vmax:        246.00 cm/s MV Mean grad: 3.0 mmHg MV Vmax:      1.35 m/s   SHUNTS MV Vmean:     78.7 cm/s  Systemic VTI:  0.13 m                          Systemic  Diam: 2.00 cm Arvilla Meres MD Electronically signed by Arvilla Meres MD Signature Date/Time: 01/30/2024/4:57:45 PM    Final    CT Angio Chest PE W and/or Wo Contrast Result Date: 01/29/2024 CLINICAL DATA:  Left lower quadrant abdominal pain. PE suspected. Chest pain and shortness of breath. EXAM: CT ANGIOGRAPHY CHEST CT ABDOMEN AND PELVIS WITH CONTRAST TECHNIQUE: Multidetector CT imaging of the chest was performed using the standard protocol during bolus administration of intravenous contrast. Multiplanar CT image reconstructions and MIPs were obtained to evaluate the vascular anatomy. Multidetector CT imaging of the abdomen and pelvis was performed using the standard protocol during bolus administration of intravenous contrast. RADIATION DOSE REDUCTION: This exam was performed according to the departmental dose-optimization program which includes automated exposure control, adjustment of the mA and/or kV according to patient size and/or use of iterative reconstruction technique. CONTRAST:  75mL OMNIPAQUE IOHEXOL 350 MG/ML SOLN COMPARISON:  Same day chest radiograph and CT chest abdomen and pelvis 11/30/2023 FINDINGS: CTA CHEST FINDINGS Cardiovascular: Negative for acute pulmonary embolism. TAVR. Mitral annular calcification. No pericardial effusion. Mediastinum/Nodes: Trachea and esophagus are unremarkable. No thoracic adenopathy. Lungs/Pleura: Mosaic attenuation of the lungs. Diffuse bronchial wall thickening. Mucous plugging in the lower lobes. Clustered nodularity in the posterior right lower lobe compatible with small airway infection/inflammation. Pleural effusion or pneumothorax.  Musculoskeletal: No acute fracture. Advanced thoracic spondylosis with bulky anterior osteophytes. Posterior fusion T11-T12. remote left rib fractures. Review of the MIP images confirms the above findings. CT ABDOMEN and PELVIS FINDINGS Hepatobiliary: No acute abnormality. Pancreas: Unremarkable. Spleen: Unremarkable. Adrenals/Urinary Tract: Stable adrenal glands. Bilateral cortical renal scarring and renal cysts. No follow-up recommended. Nonobstructing stone versus parenchymal calcification in the right kidney. No hydronephrosis. Multiple bladder diverticula. Stomach/Bowel: Postoperative change about the stomach. Normal caliber large and small bowel. No bowel wall thickening. Normal appendix. Vascular/Lymphatic: Aortic atherosclerosis. No enlarged abdominal or pelvic lymph nodes. Reproductive: Unremarkable. Other: No free intraperitoneal fluid or air. Unchanged ventral abdominal wall fat containing hernias. Fat containing inguinal hernias. Musculoskeletal: No acute fracture. Review of the MIP images confirms the above findings. IMPRESSION: 1. Negative for acute pulmonary embolism. 2. Small airway infection/inflammation. 3. No acute abnormality in the abdomen or pelvis. 4. Aortic Atherosclerosis (ICD10-I70.0). Electronically Signed   By: Minerva Fester M.D.   On: 01/29/2024 23:00   CT ABDOMEN PELVIS W CONTRAST Result Date: 01/29/2024 CLINICAL DATA:  Left lower quadrant abdominal pain. PE suspected. Chest pain and shortness of breath. EXAM: CT ANGIOGRAPHY CHEST CT ABDOMEN AND PELVIS WITH CONTRAST TECHNIQUE: Multidetector CT imaging of the chest was performed using the standard protocol during bolus administration of intravenous contrast. Multiplanar CT image reconstructions and MIPs were obtained to evaluate the vascular anatomy. Multidetector CT imaging of the abdomen and pelvis was performed using the standard protocol during bolus administration of intravenous contrast. RADIATION DOSE REDUCTION: This exam was  performed according to the departmental dose-optimization program which includes automated exposure control, adjustment of the mA and/or kV according to patient size and/or use of iterative reconstruction technique. CONTRAST:  75mL OMNIPAQUE IOHEXOL 350 MG/ML SOLN COMPARISON:  Same day chest radiograph and CT chest abdomen and pelvis 11/30/2023 FINDINGS: CTA CHEST FINDINGS Cardiovascular: Negative for acute pulmonary embolism. TAVR. Mitral annular calcification. No pericardial effusion. Mediastinum/Nodes: Trachea and esophagus are unremarkable. No thoracic adenopathy. Lungs/Pleura: Mosaic attenuation of the lungs. Diffuse bronchial wall thickening. Mucous plugging in the lower lobes. Clustered nodularity in the posterior right lower lobe compatible with small airway infection/inflammation. Pleural effusion or pneumothorax. Musculoskeletal: No  acute fracture. Advanced thoracic spondylosis with bulky anterior osteophytes. Posterior fusion T11-T12. remote left rib fractures. Review of the MIP images confirms the above findings. CT ABDOMEN and PELVIS FINDINGS Hepatobiliary: No acute abnormality. Pancreas: Unremarkable. Spleen: Unremarkable. Adrenals/Urinary Tract: Stable adrenal glands. Bilateral cortical renal scarring and renal cysts. No follow-up recommended. Nonobstructing stone versus parenchymal calcification in the right kidney. No hydronephrosis. Multiple bladder diverticula. Stomach/Bowel: Postoperative change about the stomach. Normal caliber large and small bowel. No bowel wall thickening. Normal appendix. Vascular/Lymphatic: Aortic atherosclerosis. No enlarged abdominal or pelvic lymph nodes. Reproductive: Unremarkable. Other: No free intraperitoneal fluid or air. Unchanged ventral abdominal wall fat containing hernias. Fat containing inguinal hernias. Musculoskeletal: No acute fracture. Review of the MIP images confirms the above findings. IMPRESSION: 1. Negative for acute pulmonary embolism. 2. Small  airway infection/inflammation. 3. No acute abnormality in the abdomen or pelvis. 4. Aortic Atherosclerosis (ICD10-I70.0). Electronically Signed   By: Minerva Fester M.D.   On: 01/29/2024 23:00   DG Chest Portable 1 View Result Date: 01/29/2024 CLINICAL DATA:  Chest pain or shortness of breath EXAM: PORTABLE CHEST 1 VIEW COMPARISON:  12/01/2023 FINDINGS: Stable cardiomegaly. Aortic atherosclerotic calcification. TAVR. Low lung volumes accentuate pulmonary vascularity. Left basilar atelectasis. No definite pleural effusion. No pneumothorax. Cervical and thoracic spine fusion hardware. No displaced rib fractures. IMPRESSION: Low lung volumes with left basilar atelectasis. Electronically Signed   By: Minerva Fester M.D.   On: 01/29/2024 21:09     Discharge Exam: Vitals:   01/31/24 0338 01/31/24 0718  BP: 105/74 114/63  Pulse: 81   Resp: 20 15  Temp: 97.6 F (36.4 C) 97.8 F (36.6 C)  SpO2: 94%    Vitals:   01/30/24 1938 01/30/24 2300 01/31/24 0338 01/31/24 0718  BP: 113/69 98/62 105/74 114/63  Pulse: 98 80 81   Resp: 19 18 20 15   Temp: (!) 97.3 F (36.3 C) 98.2 F (36.8 C) 97.6 F (36.4 C) 97.8 F (36.6 C)  TempSrc: Oral Oral Oral Oral  SpO2: 96% 94% 94%   Weight:      Height:        General: Pt is alert, awake, not in acute distress Cardiovascular: RRR, S1/S2 +, no rubs, no gallops Respiratory: CTA bilaterally, no wheezing, no rhonchi Abdominal: Soft, NT, ND, bowel sounds + Extremities: no edema, no cyanosis    The results of significant diagnostics from this hospitalization (including imaging, microbiology, ancillary and laboratory) are listed below for reference.     Microbiology: Recent Results (from the past 240 hours)  Resp panel by RT-PCR (RSV, Flu A&B, Covid) Anterior Nasal Swab     Status: None   Collection Time: 01/29/24  9:35 PM   Specimen: Anterior Nasal Swab  Result Value Ref Range Status   SARS Coronavirus 2 by RT PCR NEGATIVE NEGATIVE Final   Influenza  A by PCR NEGATIVE NEGATIVE Final   Influenza B by PCR NEGATIVE NEGATIVE Final    Comment: (NOTE) The Xpert Xpress SARS-CoV-2/FLU/RSV plus assay is intended as an aid in the diagnosis of influenza from Nasopharyngeal swab specimens and should not be used as a sole basis for treatment. Nasal washings and aspirates are unacceptable for Xpert Xpress SARS-CoV-2/FLU/RSV testing.  Fact Sheet for Patients: BloggerCourse.com  Fact Sheet for Healthcare Providers: SeriousBroker.it  This test is not yet approved or cleared by the [place] FDA and has been authorized for detection and/or diagnosis of SARS-CoV-2 by FDA under an Emergency Use Authorization (EUA). This EUA will remain in effect (  meaning this test can be used) for the duration of the COVID-19 declaration under Section 564(b)(1) of the Act, 21 U.S.C. section 360bbb-3(b)(1), unless the authorization is terminated or revoked.     Resp Syncytial Virus by PCR NEGATIVE NEGATIVE Final    Comment: (NOTE) Fact Sheet for Patients: BloggerCourse.com  Fact Sheet for Healthcare Providers: SeriousBroker.it  This test is not yet approved or cleared by the [place] FDA and has been authorized for detection and/or diagnosis of SARS-CoV-2 by FDA under an Emergency Use Authorization (EUA). This EUA will remain in effect (meaning this test can be used) for the duration of the COVID-19 declaration under Section 564(b)(1) of the Act, 21 U.S.C. section 360bbb-3(b)(1), unless the authorization is terminated or revoked.  Performed at [facility], [address], [place], [place] [address]   Respiratory (~20 pathogens) panel by PCR     Status: None   Collection Time: 01/30/24  6:00 AM   Specimen: Nasopharyngeal Swab; Respiratory  Result Value Ref Range Status   Adenovirus NOT DETECTED NOT DETECTED Final   Coronavirus 229E NOT  DETECTED NOT DETECTED Final    Comment: (NOTE) The Coronavirus on the Respiratory Panel, DOES NOT test for the novel  Coronavirus (2019 nCoV)    Coronavirus HKU1 NOT DETECTED NOT DETECTED Final   Coronavirus NL63 NOT DETECTED NOT DETECTED Final   Coronavirus OC43 NOT DETECTED NOT DETECTED Final   Metapneumovirus NOT DETECTED NOT DETECTED Final   Rhinovirus / Enterovirus NOT DETECTED NOT DETECTED Final   Influenza A NOT DETECTED NOT DETECTED Final   Influenza B NOT DETECTED NOT DETECTED Final   Parainfluenza Virus 1 NOT DETECTED NOT DETECTED Final   Parainfluenza Virus 2 NOT DETECTED NOT DETECTED Final   Parainfluenza Virus 3 NOT DETECTED NOT DETECTED Final   Parainfluenza Virus 4 NOT DETECTED NOT DETECTED Final   Respiratory Syncytial Virus NOT DETECTED NOT DETECTED Final   Bordetella pertussis NOT DETECTED NOT DETECTED Final   Bordetella Parapertussis NOT DETECTED NOT DETECTED Final   Chlamydophila pneumoniae NOT DETECTED NOT DETECTED Final   Mycoplasma pneumoniae NOT DETECTED NOT DETECTED Final    Comment: Performed at [facility], [address], [place], [place] [address]  MRSA Next Gen by PCR, Nasal     Status: None   Collection Time: 01/30/24  3:00 PM   Specimen: Nasal Mucosa; Nasal Swab  Result Value Ref Range Status   MRSA by PCR Next Gen NOT DETECTED NOT DETECTED Final    Comment: (NOTE) The GeneXpert MRSA Assay (FDA approved for NASAL specimens only), is one component of a comprehensive MRSA colonization surveillance program. It is not intended to diagnose MRSA infection nor to guide or monitor treatment for MRSA infections. Test performance is not FDA approved in patients less than 40 years old. Performed at [facility], [address], [place], [place] [address]      Labs: BNP (last 3 results) Recent Labs    04/29/23 1511  BNP 190.3*   Basic Metabolic Panel: Recent Labs  Lab 01/29/24 2046 01/30/24 0433 01/31/24 0215  NA 137 137 135  K 3.9  4.3 3.8  CL 107 101 101  CO2 20* 17* 25  GLUCOSE 81 84 112*  BUN 17 18 24*  CREATININE 0.85 0.94 1.08  CALCIUM 8.0* 9.7 8.9  MG  --  1.9  --   PHOS  --  2.9  --    Liver Function Tests: Recent Labs  Lab 01/29/24 2046  01/30/24 0433  AST 25 24  ALT 23 26  ALKPHOS 71 80  BILITOT 1.2 1.3*  PROT 6.2* 7.2  ALBUMIN 3.0* 3.5   No results for input(s): "LIPASE", "AMYLASE" in the last 168 hours. No results for input(s): "AMMONIA" in the last 168 hours. CBC: Recent Labs  Lab 01/29/24 2046 01/30/24 0433 01/31/24 0215  WBC 10.4 12.7* 10.1  NEUTROABS  --   --  7.3  HGB 14.4 14.4 13.4  HCT 42.7 42.7 40.9  MCV 97.3 96.6 97.8  PLT 242 297 223   Cardiac Enzymes: No results for input(s): "CKTOTAL", "CKMB", "CKMBINDEX", "TROPONINI" in the last 168 hours. BNP: Invalid input(s): "POCBNP" CBG: No results for input(s): "GLUCAP" in the last 168 hours. D-Dimer No results for input(s): "DDIMER" in the last 72 hours. Hgb A1c Recent Labs    01/30/24 0433  HGBA1C 5.3   Lipid Profile No results for input(s): "CHOL", "HDL", "LDLCALC", "TRIG", "CHOLHDL", "LDLDIRECT" in the last 72 hours. Thyroid function studies Recent Labs    01/30/24 0433  TSH 1.597   Anemia work up No results for input(s): "VITAMINB12", "FOLATE", "FERRITIN", "TIBC", "IRON", "RETICCTPCT" in the last 72 hours. Urinalysis    Component Value Date/Time   COLORURINE YELLOW 01/29/2024 2122   APPEARANCEUR CLEAR 01/29/2024 2122   LABSPEC 1.024 01/29/2024 2122   PHURINE 7.0 01/29/2024 2122   GLUCOSEU NEGATIVE 01/29/2024 2122   HGBUR NEGATIVE 01/29/2024 2122   HGBUR negative 04/08/2007 1327   BILIRUBINUR NEGATIVE 01/29/2024 2122   KETONESUR 5 (A) 01/29/2024 2122   PROTEINUR NEGATIVE 01/29/2024 2122   UROBILINOGEN 0.2 04/24/2010 1617   NITRITE NEGATIVE 01/29/2024 2122   LEUKOCYTESUR NEGATIVE 01/29/2024 2122   Sepsis Labs Recent Labs  Lab 01/29/24 2046 01/30/24 0433 01/31/24 0215  WBC 10.4 12.7* 10.1    Microbiology Recent Results (from the past 240 hours)  Resp panel by RT-PCR (RSV, Flu A&B, Covid) Anterior Nasal Swab     Status: None   Collection Time: 01/29/24  9:35 PM   Specimen: Anterior Nasal Swab  Result Value Ref Range Status   SARS Coronavirus 2 by RT PCR NEGATIVE NEGATIVE Final   Influenza A by PCR NEGATIVE NEGATIVE Final   Influenza B by PCR NEGATIVE NEGATIVE Final    Comment: (NOTE) The Xpert Xpress SARS-CoV-2/FLU/RSV plus assay is intended as an aid in the diagnosis of influenza from Nasopharyngeal swab specimens and should not be used as a sole basis for treatment. Nasal washings and aspirates are unacceptable for Xpert Xpress SARS-CoV-2/FLU/RSV testing.  Fact Sheet for Patients: BloggerCourse.com  Fact Sheet for Healthcare Providers: SeriousBroker.it  This test is not yet approved or cleared by the [place] FDA and has been authorized for detection and/or diagnosis of SARS-CoV-2 by FDA under an Emergency Use Authorization (EUA). This EUA will remain in effect (meaning this test can be used) for the duration of the COVID-19 declaration under Section 564(b)(1) of the Act, 21 U.S.C. section 360bbb-3(b)(1), unless the authorization is terminated or revoked.     Resp Syncytial Virus by PCR NEGATIVE NEGATIVE Final    Comment: (NOTE) Fact Sheet for Patients: BloggerCourse.com  Fact Sheet for Healthcare Providers: SeriousBroker.it  This test is not yet approved or cleared by the [place] FDA and has been authorized for detection and/or diagnosis of SARS-CoV-2 by FDA under an Emergency Use Authorization (EUA). This EUA will remain in effect (meaning this test can be used) for the duration of the COVID-19 declaration under Section 564(b)(1) of the Act, 21 U.S.C.  section 360bbb-3(b)(1), unless the authorization is terminated or revoked.  Performed at  [facility], [address] [place], [place] [address]   Respiratory (~20 pathogens) panel by PCR     Status: None   Collection Time: 01/30/24  6:00 AM   Specimen: Nasopharyngeal Swab; Respiratory  Result Value Ref Range Status   Adenovirus NOT DETECTED NOT DETECTED Final   Coronavirus 229E NOT DETECTED NOT DETECTED Final    Comment: (NOTE) The Coronavirus on the Respiratory Panel, DOES NOT test for the novel  Coronavirus (2019 nCoV)    Coronavirus HKU1 NOT DETECTED NOT DETECTED Final   Coronavirus NL63 NOT DETECTED NOT DETECTED Final   Coronavirus OC43 NOT DETECTED NOT DETECTED Final   Metapneumovirus NOT DETECTED NOT DETECTED Final   Rhinovirus / Enterovirus NOT DETECTED NOT DETECTED Final   Influenza A NOT DETECTED NOT DETECTED Final   Influenza B NOT DETECTED NOT DETECTED Final   Parainfluenza Virus 1 NOT DETECTED NOT DETECTED Final   Parainfluenza Virus 2 NOT DETECTED NOT DETECTED Final   Parainfluenza Virus 3 NOT DETECTED NOT DETECTED Final   Parainfluenza Virus 4 NOT DETECTED NOT DETECTED Final   Respiratory Syncytial Virus NOT DETECTED NOT DETECTED Final   Bordetella pertussis NOT DETECTED NOT DETECTED Final   Bordetella Parapertussis NOT DETECTED NOT DETECTED Final   Chlamydophila pneumoniae NOT DETECTED NOT DETECTED Final   Mycoplasma pneumoniae NOT DETECTED NOT DETECTED Final    Comment: Performed at [facility], [address], [place], [place] [address]  MRSA Next Gen by PCR, Nasal     Status: None   Collection Time: 01/30/24  3:00 PM   Specimen: Nasal Mucosa; Nasal Swab  Result Value Ref Range Status   MRSA by PCR Next Gen NOT DETECTED NOT DETECTED Final    Comment: (NOTE) The GeneXpert MRSA Assay (FDA approved for NASAL specimens only), is one component of a comprehensive MRSA colonization surveillance program. It is not intended to diagnose MRSA infection nor to guide or monitor treatment for MRSA infections. Test performance is not FDA approved  in patients less than 18 years old. Performed at [facility], [address], [place], [place] [address]     FURTHER DISCHARGE INSTRUCTIONS:   Get Medicines reviewed and adjusted: Please take all your medications with you for your next visit with your Primary MD   Laboratory/radiological data: Please request your Primary MD to go over all hospital tests and procedure/radiological results at the follow up, please ask your Primary MD to get all Hospital records sent to his/her office.   In some cases, they will be blood work, cultures and biopsy results pending at the time of your discharge. Please request that your primary care M.D. goes through all the records of your hospital data and follows up on these results.   Also Note the following: If you experience worsening of your admission symptoms, develop shortness of breath, life threatening emergency, suicidal or homicidal thoughts you must seek medical attention immediately by calling 911 or calling your MD immediately  if symptoms less severe.   You must read complete instructions/literature along with all the possible adverse reactions/side effects for all the Medicines you take and that have been prescribed to you. Take any new Medicines after you have completely understood and accpet all the possible adverse reactions/side effects.    Do not drive when taking Pain medications or sleeping medications (Benzodaizepines)   Do not take more than prescribed Pain, Sleep and  Anxiety Medications. It is not advisable to combine anxiety,sleep and pain medications without talking with your primary care practitioner   Special Instructions: If you have smoked or chewed Tobacco  in the last 2 yrs please stop smoking, stop any regular Alcohol  and or any Recreational drug use.   Wear Seat belts while driving.   Please note: You were cared for by a hospitalist during your hospital stay. Once you are discharged, your primary care physician  will handle any further medical issues. Please note that NO REFILLS for any discharge medications will be authorized once you are discharged, as it is imperative that you return to your primary care physician (or establish a relationship with a primary care physician if you do not have one) for your post hospital discharge needs so that they can reassess your need for medications and monitor your lab values  Time coordinating discharge: Over 30 minutes  SIGNED:   Hughie Closs, MD  [facility] 01/31/2024, 9:36 AM *Please note that this is a verbal dictation therefore any spelling or grammatical errors are due to the "Dragon Medical One" system interpretation. If 7PM-7AM, please contact night-coverage [email or web address]

## 2024-01-31 NOTE — Evaluation (Signed)
Physical Therapy Evaluation Patient Details Name: [name] MRN: [id number] DOB: 02/28/1941 Today's Date: 01/31/2024  History of Present Illness  83 y.o. male admitted with chest pain/abdominal pain and shortness of breath. Pt with Afib RVR.  PMH recent fall with rib fx, L PTX, Afib, CVA, GIB, diastolic heart failure, chronic back pain w/ multiple back surgeries, memory deficits, ataxia  Clinical Impression  Patient presents with mobility close to baseline.  Using cane form home and able to ambulate in hallway including turns, negotiating around bed in the room without LOB and with HR in 90's.  Patient reports history of balance issues and recent fall outside, though declining outpatient PT at this time.  States has his "books" for exercise at home and uses his routine to remain as mobile as he can despite chronic pain issues.  PT will sign off as planned d/c home today.         If plan is discharge home, recommend the following: Help with stairs or ramp for entrance;Assist for transportation   Can travel by private vehicle        Equipment Recommendations None recommended by PT  Recommendations for Other Services       Functional Status Assessment Patient has had a recent decline in their functional status and demonstrates the ability to make significant improvements in function in a reasonable and predictable amount of time.     Precautions / Restrictions Precautions Precautions: Fall      Mobility  Bed Mobility               General bed mobility comments: in recliner    Transfers Overall transfer level: Needs assistance Equipment used: Straight cane Transfers: Sit to/from Stand Sit to Stand: Supervision           General transfer comment: assist for lines    Ambulation/Gait Ambulation/Gait assistance: Supervision Gait Distance (Feet): 80 Feet Assistive device: Straight cane Gait Pattern/deviations: Step-through pattern, Decreased stride length, Trunk  flexed, Wide base of support       General Gait Details: keeping cane at a distance and flexed posture though no LOB with hallway ambulation and turns and negotiation around bed in the room  Stairs            Wheelchair Mobility     Tilt Bed    Modified Rankin (Stroke Patients Only)       Balance Overall balance assessment: History of Falls, Needs assistance   Sitting balance-Leahy Scale: Good       Standing balance-Leahy Scale: Fair                               Pertinent Vitals/Pain Pain Assessment Pain Score: 4  Pain Location: back/neck with mobility Pain Descriptors / Indicators: Tightness Pain Intervention(s): Monitored during session    Home Living Family/patient expects to be discharged to:: Private residence Living Arrangements: Other relatives (with sister) Available Help at Discharge: Family;Available PRN/intermittently Type of Home: House Home Access: Level entry       Home Layout: One level Home Equipment: Grab bars - toilet;Grab bars - tub/shower;Wheelchair - Forensic psychologist (2 wheels);Cane - single point;Lift chair;Shower seat - built in      Prior Function Prior Level of Function : Independent/Modified Independent;History of Falls (last six months);Driving             Mobility Comments: mod I w/ cane, reports 1 fall outsdie last week; able to  get up from ground independently ADLs Comments: independent ADLs, drives, manages iADLs (report stopping his meds for the last 4 days PTA)     Extremity/Trunk Assessment   Upper Extremity Assessment Upper Extremity Assessment: Defer to OT evaluation    Lower Extremity Assessment Lower Extremity Assessment: Overall WFL for tasks assessed (Bilat neuropathy)    Cervical / Trunk Assessment Cervical / Trunk Assessment: Back Surgery;Neck Surgery;Other exceptions Cervical / Trunk Exceptions: cervical stiffness at baseline  Communication   Communication Communication: No  apparent difficulties    Cognition Arousal: Alert Behavior During Therapy: WFL for tasks assessed/performed   PT - Cognitive impairments: No apparent impairments                         Following commands: Intact       Cueing       General Comments General comments (skin integrity, edema, etc.): VSS with mobility HR into 90's; discussed fall prevention and pt also relates has previous HEP from when he had therapy before and encouraged to perform as able to with safe technique to maintain mobility    Exercises     Assessment/Plan    PT Assessment Patient does not need any further PT services  PT Problem List         PT Treatment Interventions      PT Goals (Current goals can be found in the Care Plan section)  Acute Rehab PT Goals PT Goal Formulation: All assessment and education complete, DC therapy    Frequency       Co-evaluation               AM-PAC PT "6 Clicks" Mobility  Outcome Measure Help needed turning from your back to your side while in a flat bed without using bedrails?: A Little Help needed moving from lying on your back to sitting on the side of a flat bed without using bedrails?: A Little Help needed moving to and from a bed to a chair (including a wheelchair)?: None Help needed standing up from a chair using your arms (e.g., wheelchair or bedside chair)?: None Help needed to walk in hospital room?: None Help needed climbing 3-5 steps with a railing? : A Little 6 Click Score: 21    End of Session Equipment Utilized During Treatment: Gait belt Activity Tolerance: Patient tolerated treatment well Patient left: in chair;with call bell/phone within reach;with chair alarm set   PT Visit Diagnosis: Other abnormalities of gait and mobility (R26.89)    Time: 1610-9604 PT Time Calculation (min) (ACUTE ONLY): 24 min   Charges:   PT Evaluation $PT Eval Low Complexity: 1 Low PT Treatments $Gait Training: 8-22 mins PT General  Charges $$ ACUTE PT VISIT: 1 Visit         Sheran Lawless, PT Acute Rehabilitation Services Office:[phone] 01/31/2024   [name] 01/31/2024, 11:39 AM

## 2024-01-31 NOTE — TOC Transition Note (Signed)
Transition of Care [facility]) - Discharge Note   Patient Details  Name: [name] MRN: [id number] Date of Birth: [birth year]  Transition of Care [facility]) CM/SW Contact:  Harriet Masson, RN Phone Number: 01/31/2024, 11:59 AM   Clinical Narrative:   Patient stable to discharge home.  No TOC needs at this time.     Final next level of care: Home/Self Care Barriers to Discharge: Barriers Resolved   Patient Goals and CMS Choice Patient states their goals for this hospitalization and ongoing recovery are:: return home          Discharge Placement               home        Discharge Plan and Services Additional resources added to the After Visit Summary for                                       Social Drivers of Health (SDOH) Interventions SDOH Screenings   Food Insecurity: No Food Insecurity (01/30/2024)  Housing: Low Risk  (01/30/2024)  Transportation Needs: No Transportation Needs (01/30/2024)  Utilities: Not At Risk (01/30/2024)  Social Connections: Moderately Integrated (01/30/2024)  Tobacco Use: Medium Risk (01/30/2024)     Readmission Risk Interventions    01/31/2024   11:58 AM 10/27/2023   11:13 AM  Readmission Risk Prevention Plan  Transportation Screening Complete Complete  PCP or Specialist Appt within 5-7 Days Complete Complete  Home Care Screening Complete Complete  Medication Review (RN CM) Complete Complete

## 2024-03-15 ENCOUNTER — Inpatient Hospital Stay (HOSPITAL_BASED_OUTPATIENT_CLINIC_OR_DEPARTMENT_OTHER)
Admission: EM | Admit: 2024-03-15 | Discharge: 2024-03-19 | DRG: 291 | Disposition: A | Discharge status: Home Health | Attending: Internal Medicine | Admitting: Internal Medicine

## 2024-03-15 ENCOUNTER — Emergency Department (HOSPITAL_BASED_OUTPATIENT_CLINIC_OR_DEPARTMENT_OTHER): Admitting: Radiology

## 2024-03-15 ENCOUNTER — Encounter (HOSPITAL_BASED_OUTPATIENT_CLINIC_OR_DEPARTMENT_OTHER): Payer: Self-pay

## 2024-03-15 ENCOUNTER — Emergency Department (HOSPITAL_BASED_OUTPATIENT_CLINIC_OR_DEPARTMENT_OTHER)

## 2024-03-15 DIAGNOSIS — Z1152 Encounter for screening for COVID-19: Secondary | ICD-10-CM

## 2024-03-15 DIAGNOSIS — G894 Chronic pain syndrome: Secondary | ICD-10-CM | POA: Diagnosis present

## 2024-03-15 DIAGNOSIS — I700 Atherosclerosis of aorta: Secondary | ICD-10-CM | POA: Diagnosis present

## 2024-03-15 DIAGNOSIS — I110 Hypertensive heart disease with heart failure: Principal | ICD-10-CM | POA: Diagnosis present

## 2024-03-15 DIAGNOSIS — Z888 Allergy status to other drugs, medicaments and biological substances status: Secondary | ICD-10-CM

## 2024-03-15 DIAGNOSIS — R296 Repeated falls: Secondary | ICD-10-CM | POA: Diagnosis present

## 2024-03-15 DIAGNOSIS — Z96653 Presence of artificial knee joint, bilateral: Secondary | ICD-10-CM | POA: Diagnosis present

## 2024-03-15 DIAGNOSIS — L0390 Cellulitis, unspecified: Secondary | ICD-10-CM | POA: Diagnosis present

## 2024-03-15 DIAGNOSIS — Z952 Presence of prosthetic heart valve: Secondary | ICD-10-CM

## 2024-03-15 DIAGNOSIS — Z801 Family history of malignant neoplasm of trachea, bronchus and lung: Secondary | ICD-10-CM

## 2024-03-15 DIAGNOSIS — Z79899 Other long term (current) drug therapy: Secondary | ICD-10-CM

## 2024-03-15 DIAGNOSIS — Z7901 Long term (current) use of anticoagulants: Secondary | ICD-10-CM

## 2024-03-15 DIAGNOSIS — I5033 Acute on chronic diastolic (congestive) heart failure: Secondary | ICD-10-CM | POA: Diagnosis present

## 2024-03-15 DIAGNOSIS — Z8673 Personal history of transient ischemic attack (TIA), and cerebral infarction without residual deficits: Secondary | ICD-10-CM

## 2024-03-15 DIAGNOSIS — W07XXXA Fall from chair, initial encounter: Secondary | ICD-10-CM | POA: Diagnosis present

## 2024-03-15 DIAGNOSIS — D509 Iron deficiency anemia, unspecified: Secondary | ICD-10-CM | POA: Diagnosis present

## 2024-03-15 DIAGNOSIS — R0602 Shortness of breath: Principal | ICD-10-CM

## 2024-03-15 DIAGNOSIS — S2239XA Fracture of one rib, unspecified side, initial encounter for closed fracture: Secondary | ICD-10-CM | POA: Diagnosis present

## 2024-03-15 DIAGNOSIS — Y92019 Unspecified place in single-family (private) house as the place of occurrence of the external cause: Secondary | ICD-10-CM

## 2024-03-15 DIAGNOSIS — Z87891 Personal history of nicotine dependence: Secondary | ICD-10-CM

## 2024-03-15 DIAGNOSIS — S8011XA Contusion of right lower leg, initial encounter: Secondary | ICD-10-CM | POA: Diagnosis present

## 2024-03-15 DIAGNOSIS — Z8774 Personal history of (corrected) congenital malformations of heart and circulatory system: Secondary | ICD-10-CM

## 2024-03-15 DIAGNOSIS — Z7984 Long term (current) use of oral hypoglycemic drugs: Secondary | ICD-10-CM

## 2024-03-15 DIAGNOSIS — D649 Anemia, unspecified: Secondary | ICD-10-CM

## 2024-03-15 DIAGNOSIS — J9601 Acute respiratory failure with hypoxia: Secondary | ICD-10-CM | POA: Diagnosis not present

## 2024-03-15 DIAGNOSIS — I2510 Atherosclerotic heart disease of native coronary artery without angina pectoris: Secondary | ICD-10-CM | POA: Diagnosis present

## 2024-03-15 DIAGNOSIS — Z981 Arthrodesis status: Secondary | ICD-10-CM

## 2024-03-15 DIAGNOSIS — Z7982 Long term (current) use of aspirin: Secondary | ICD-10-CM

## 2024-03-15 DIAGNOSIS — S2232XA Fracture of one rib, left side, initial encounter for closed fracture: Secondary | ICD-10-CM | POA: Diagnosis present

## 2024-03-15 DIAGNOSIS — E785 Hyperlipidemia, unspecified: Secondary | ICD-10-CM | POA: Diagnosis present

## 2024-03-15 DIAGNOSIS — I4821 Permanent atrial fibrillation: Secondary | ICD-10-CM | POA: Diagnosis present

## 2024-03-15 DIAGNOSIS — F1120 Opioid dependence, uncomplicated: Secondary | ICD-10-CM | POA: Diagnosis present

## 2024-03-15 DIAGNOSIS — J690 Pneumonitis due to inhalation of food and vomit: Secondary | ICD-10-CM | POA: Diagnosis present

## 2024-03-15 DIAGNOSIS — F32A Depression, unspecified: Secondary | ICD-10-CM | POA: Diagnosis present

## 2024-03-15 DIAGNOSIS — M545 Low back pain, unspecified: Secondary | ICD-10-CM | POA: Diagnosis present

## 2024-03-15 DIAGNOSIS — I509 Heart failure, unspecified: Secondary | ICD-10-CM

## 2024-03-15 LAB — OCCULT BLOOD X 1 CARD TO LAB, STOOL: Fecal Occult Bld: NEGATIVE

## 2024-03-15 LAB — COMPREHENSIVE METABOLIC PANEL WITH GFR
ALT: 19 U/L (ref 0–44)
AST: 26 U/L (ref 15–41)
Albumin: 3.5 g/dL (ref 3.5–5.0)
Alkaline Phosphatase: 73 U/L (ref 38–126)
Anion gap: 6 (ref 5–15)
BUN: 22 mg/dL (ref 8–23)
CO2: 28 mmol/L (ref 22–32)
Calcium: 8.8 mg/dL — ABNORMAL LOW (ref 8.9–10.3)
Chloride: 101 mmol/L (ref 98–111)
Creatinine, Ser: 1.15 mg/dL (ref 0.61–1.24)
GFR, Estimated: >60 mL/min (ref >60)
Glucose, Bld: 101 mg/dL — ABNORMAL HIGH (ref 70–99)
Potassium: 4.2 mmol/L (ref 3.5–5.1)
Sodium: 135 mmol/L (ref 135–145)
Total Bilirubin: 0.5 mg/dL (ref 0.0–1.2)
Total Protein: 6.9 g/dL (ref 6.5–8.1)

## 2024-03-15 LAB — PROTIME-INR
INR: 1 (ref 0.8–1.2)
Prothrombin Time: 13.5 s (ref 11.4–15.2)

## 2024-03-15 LAB — CBC
HCT: 33.9 % — ABNORMAL LOW (ref 39.0–52.0)
Hemoglobin: 10.8 g/dL — ABNORMAL LOW (ref 13.0–17.0)
MCH: 31.9 pg (ref 26.0–34.0)
MCHC: 31.9 g/dL (ref 30.0–36.0)
MCV: 100 fL (ref 80.0–100.0)
Platelets: 194 10*3/uL (ref 150–400)
RBC: 3.39 MIL/uL — ABNORMAL LOW (ref 4.22–5.81)
RDW: 15 % (ref 11.5–15.5)
WBC: 6.3 10*3/uL (ref 4.0–10.5)
nRBC: 0 % (ref 0.0–0.2)

## 2024-03-15 LAB — TROPONIN I (HIGH SENSITIVITY)
Troponin I (High Sensitivity): 6 ng/L (ref <18)
Troponin I (High Sensitivity): 6 ng/L (ref <18)

## 2024-03-15 LAB — BRAIN NATRIURETIC PEPTIDE: B Natriuretic Peptide: 578.5 pg/mL — ABNORMAL HIGH (ref 0.0–100.0)

## 2024-03-15 MED ORDER — APIXABAN 5 MG PO TABS
5.0000 mg | ORAL_TABLET | Freq: Two times a day (BID) | ORAL | Status: DC
  Administered 2024-03-15 – 2024-03-19 (×8): 5 mg via ORAL
  Filled 2024-03-15: qty 2
  Filled 2024-03-15 (×3): qty 1
  Filled 2024-03-15: qty 2
  Filled 2024-03-15 (×3): qty 1

## 2024-03-15 MED ORDER — FUROSEMIDE 10 MG/ML IJ SOLN
40.0000 mg | Freq: Once | INTRAMUSCULAR | Status: AC
Start: 2024-03-15 — End: 2024-03-15
  Administered 2024-03-15: 40 mg via INTRAVENOUS
  Filled 2024-03-15: qty 4

## 2024-03-15 MED ORDER — MORPHINE SULFATE (PF) 2 MG/ML IV SOLN
2.0000 mg | Freq: Once | INTRAVENOUS | Status: AC
  Administered 2024-03-15: 2 mg via INTRAVENOUS
  Filled 2024-03-15: qty 1

## 2024-03-15 MED ORDER — IOHEXOL 300 MG/ML  SOLN
100.0000 mL | Freq: Once | INTRAMUSCULAR | Status: AC | PRN
  Administered 2024-03-15: 100 mL via INTRAVENOUS

## 2024-03-15 MED ORDER — CEFTRIAXONE SODIUM 1 G IJ SOLR
1.0000 g | INTRAMUSCULAR | Status: DC
  Administered 2024-03-15: 1 g via INTRAVENOUS
  Filled 2024-03-15: qty 10

## 2024-03-15 MED ORDER — METOPROLOL SUCCINATE ER 25 MG PO TB24
25.0000 mg | ORAL_TABLET | Freq: Every day | ORAL | Status: DC
Start: 2024-03-15 — End: 2024-03-19
  Administered 2024-03-15 – 2024-03-19 (×5): 25 mg via ORAL
  Filled 2024-03-15 (×5): qty 1

## 2024-03-15 MED ORDER — DILTIAZEM HCL ER COATED BEADS 120 MG PO CP24
120.0000 mg | ORAL_CAPSULE | Freq: Every day | ORAL | Status: DC
  Administered 2024-03-16 – 2024-03-19 (×4): 120 mg via ORAL
  Filled 2024-03-15 (×4): qty 1

## 2024-03-15 MED ORDER — MORPHINE SULFATE (PF) 4 MG/ML IV SOLN
4.0000 mg | Freq: Once | INTRAVENOUS | Status: AC
  Administered 2024-03-15: 4 mg via INTRAVENOUS
  Filled 2024-03-15: qty 1

## 2024-03-15 MED ORDER — SODIUM CHLORIDE 0.9 % IV SOLN
500.0000 mg | INTRAVENOUS | Status: DC
  Administered 2024-03-15: 500 mg via INTRAVENOUS
  Filled 2024-03-15: qty 5

## 2024-03-15 MED ORDER — FENTANYL CITRATE PF 50 MCG/ML IJ SOSY
50.0000 ug | PREFILLED_SYRINGE | Freq: Once | INTRAMUSCULAR | Status: AC
  Administered 2024-03-15: 50 ug via INTRAVENOUS
  Filled 2024-03-15: qty 1

## 2024-03-15 NOTE — ED Provider Notes (Signed)
 [place] [facility] AT [facility] Provider Note   CSN: [id number] Arrival date & time: 03/15/24  1608     History  Chief Complaint  Patient presents with   Shortness of Breath    [name] is a 83 y.o. male with an extensive medical history to include A-fib, chronic pain, hypertension, osteoarthritis, bilateral knee replacements, acute on chronic diastolic CHF, permanent A-fib, acute metabolic encephalopathy, abdominal pain.  Patient presents to ED for evaluation of shortness of breath.  The patient reports that yesterday he had a fall, slid out of a chair.  Reports he struck the left side of his chest wall on a chair.  The patient comes in complaining of left-sided rib pain, left-sided chest pain, shortness of breath, abdominal pain.  Patient reports compliance on Eliquis.  Patient reports that he has not been taking Lasix for CHF as he was told not to on his last admission and discharge.  Patient states that shortness of breath was present prior to falling and striking the left chest wall.  He denies hitting his head or losing consciousness during this event.  Denies nausea, vomiting, fevers, dysuria.  He is endorsing weakness but denies lightheadedness or dizziness.  Sister here with patient to provide some history.  Patient also has exquisite lumbar spinal tenderness but denies any history of back pain.  Denies red flag symptoms of low back pain.  Patient Dors any leg swelling.   Shortness of Breath Associated symptoms: abdominal pain and chest pain   Associated symptoms: no fever and no vomiting        Home Medications Prior to Admission medications   Medication Sig Start Date End Date Taking? Authorizing Provider  apixaban (ELIQUIS) 5 MG TABS tablet Take 5 mg by mouth 2 (two) times daily.    [provider]  aspirin EC 81 MG tablet Take 81 mg by mouth daily.    [provider]  buPROPion (WELLBUTRIN) 100 MG tablet Take 100 mg by mouth 2  (two) times daily.    [provider]  cyanocobalamin (,VITAMIN B-12,) 1000 MCG/ML injection Inject 1 mL into the muscle every 28 (twenty-eight) days. 03/15/20   [provider]  diltiazem (CARDIZEM CD) 120 MG 24 hr capsule Take 1 capsule (120 mg total) by mouth daily. 10/09/19   Kathlen Mody, MD  ferrous sulfate 325 (65 FE) MG tablet Take 325 mg by mouth daily with breakfast. 03/15/20   [provider]  fluticasone (FLONASE) 50 MCG/ACT nasal spray Place 1 spray into both nostrils as directed. 01/24/24   [provider]  furosemide (LASIX) 20 MG tablet Take 20 mg by mouth daily. May take one additional tablet as needed for leg swelling.    [provider]  gabapentin (NEURONTIN) 400 MG capsule Take 1 capsule (400 mg total) by mouth 3 (three) times daily. 12/03/23   Marinda Elk, MD  gabapentin (NEURONTIN) 800 MG tablet Take 800 mg by mouth 3 (three) times daily. 01/02/24   [provider]  lidocaine (LIDODERM) 5 % Place 1 patch onto the skin daily. 01/18/24   [provider]  metoprolol succinate (TOPROL-XL) 25 MG 24 hr tablet Take 25 mg by mouth daily.    [provider]  oxyCODONE-acetaminophen (PERCOCET) 10-325 MG tablet Take 1 tablet by mouth 5 (five) times daily as needed for pain.    [provider]  polyethylene glycol (MIRALAX / GLYCOLAX) packet Take 17 g by mouth daily as needed for mild  constipation.    [provider]  potassium chloride (K-DUR,KLOR-CON) 20 MEQ tablet Take 1 tablet (20 mEq total) by mouth daily. Patient taking differently: Take 20 mEq by mouth at bedtime. 09/20/18   Strader, Lennart Pall, PA-C  rOPINIRole (REQUIP) 0.25 MG tablet Take 0.25 mg by mouth 2 (two) times daily.  10/09/18   [provider]  silver sulfADIAZINE (SILVADENE) 1 % cream Apply 1 Application topically. 11/25/23   [provider]  simvastatin (ZOCOR) 40 MG tablet Take 40 mg by mouth at bedtime.     [provider]  spironolactone (ALDACTONE) 25 MG tablet Take 25 mg by mouth daily.    [provider]  tamsulosin (FLOMAX) 0.4 MG CAPS capsule Take 0.8 mg by mouth daily. 09/02/19   [provider]  topiramate (TOPAMAX) 25 MG capsule Take 25 mg by mouth 2 (two) times daily.    [provider]      Allergies    Lorazepam, Duloxetine, Pregabalin, and Zanaflex [tizanidine hcl]    Review of Systems   Review of Systems  Constitutional:  Negative for fever.  Respiratory:  Positive for shortness of breath.   Cardiovascular:  Positive for chest pain.  Gastrointestinal:  Positive for abdominal pain. Negative for nausea and vomiting.  Genitourinary:  Negative for dysuria.  Musculoskeletal:  Positive for back pain.  Neurological:  Positive for weakness. Negative for dizziness and light-headedness.  All other systems reviewed and are negative.   Physical Exam Updated Vital Signs BP 117/77   Pulse 94   Temp 97.7 F (36.5 C)   Resp 18   SpO2 96%  Physical Exam Vitals and nursing note reviewed.  Constitutional:      General: He is not in acute distress.    Appearance: He is well-developed.  HENT:     Head: Normocephalic and atraumatic.  Eyes:     Conjunctiva/sclera: Conjunctivae normal.  Neck:     Comments: No cervical spinal tenderness Cardiovascular:     Rate and Rhythm: Normal rate and regular rhythm.     Heart sounds: No murmur heard. Pulmonary:     Effort: Pulmonary effort is normal. No respiratory distress.     Breath sounds: Rales present.  Chest:       Comments: Ecchymosis to left chest wall with tenderness.  No crepitus. Abdominal:     Palpations: Abdomen is soft.     Tenderness: There is abdominal tenderness.     Comments: Epigastric tenderness without rebound or guarding.  No overlying skin change.  Musculoskeletal:        General: No swelling.     Cervical back: Neck supple.     Lumbar back: Tenderness present.       Back:      Right lower leg: 1+ Pitting Edema present.     Left lower leg: 1+ Pitting Edema present.  Skin:    General: Skin is warm and dry.     Capillary Refill: Capillary refill takes less than 2 seconds.  Neurological:     General: No focal deficit present.     Mental Status: He is alert and oriented to person, place, and time.     GCS: GCS eye subscore is 4. GCS verbal subscore is 5. GCS motor subscore is 6.     Cranial Nerves: Cranial nerves 2-12 are intact. No cranial nerve deficit.     Sensory: Sensation is intact. No sensory deficit.     Motor: Motor function is intact. No weakness.  Comments: 5 out of 5 strength bilateral lower extremities  Psychiatric:        Mood and Affect: Mood normal.     ED Results / Procedures / Treatments   Labs (all labs ordered are listed, but only abnormal results are displayed) Labs Reviewed  CBC - Abnormal; Notable for the following components:      Result Value   RBC 3.39 (*)    Hemoglobin 10.8 (*)    HCT 33.9 (*)    All other components within normal limits  BRAIN NATRIURETIC PEPTIDE - Abnormal; Notable for the following components:   B Natriuretic Peptide 578.5 (*)    All other components within normal limits  COMPREHENSIVE METABOLIC PANEL WITH GFR - Abnormal; Notable for the following components:   Glucose, Bld 101 (*)    Calcium 8.8 (*)    All other components within normal limits  PROTIME-INR  POC OCCULT BLOOD, ED  TROPONIN I (HIGH SENSITIVITY)  TROPONIN I (HIGH SENSITIVITY)    EKG EKG Interpretation Date/Time:  Sunday March 15 2024 16:25:02 EDT Ventricular Rate:  94 PR Interval:    QRS Duration:  113 QT Interval:  353 QTC Calculation: 439 R Axis:   96  Text Interpretation: Atrial fibrillation Anterior infarct, old Nonspecific T abnormalities, lateral leads similar to prior ekg Confirmed by Rolan Bucco [id number]) on 03/15/2024 4:38:25 PM  Radiology CT Head Wo Contrast Result Date: 03/15/2024 CLINICAL DATA:  Head trauma,  moderate-severe EXAM: CT HEAD WITHOUT CONTRAST TECHNIQUE: Contiguous axial images were obtained from the base of the skull through the vertex without intravenous contrast. RADIATION DOSE REDUCTION: This exam was performed according to the departmental dose-optimization program which includes automated exposure control, adjustment of the mA and/or kV according to patient size and/or use of iterative reconstruction technique. COMPARISON:  None Available. FINDINGS: Brain: There is atrophy and chronic small vessel disease changes. No acute intracranial abnormality. Specifically, no hemorrhage, hydrocephalus, mass lesion, acute infarction, or significant intracranial injury. Vascular: No hyperdense vessel or unexpected calcification. Skull: No acute calvarial abnormality. Sinuses/Orbits: No acute findings Other: None IMPRESSION: Atrophy, chronic microvascular disease. No acute intracranial abnormality. Electronically Signed   By: Charlett Nose M.D.   On: 03/15/2024 18:53    Procedures Procedures   Medications Ordered in ED Medications  fentaNYL (SUBLIMAZE) injection 50 mcg (50 mcg Intravenous Given 03/15/24 1703)  furosemide (LASIX) injection 40 mg (40 mg Intravenous Given 03/15/24 1800)  iohexol (OMNIPAQUE) 300 MG/ML solution 100 mL (100 mLs Intravenous Contrast Given 03/15/24 1811)    ED Course/ Medical Decision Making/ A&P  Medical Decision Making Amount and/or Complexity of Data Reviewed Labs: ordered. Radiology: ordered.  Risk Prescription drug management. Decision regarding hospitalization.   83 year old male presents for evaluation.  Please see HPI for further details.  On examination patient is afebrile and nontachycardic.  Lung sounds have rales present, not hypoxic on room air.  Abdomen has tenderness in the epigastric region without rebound or guarding, negative CVA tenderness bilaterally.  Neurological examinations at baseline with 5 out of 5 strength bilateral lower extremities.   Patient does have tenderness to his lumbar spinal region.  Patient also has excessive ecchymosis to his left chest wall with tenderness, no crepitus.  Reports falling yesterday.  Differential for this patient is extensive.  Multiple complaints.  Will begin workup with CBC, CMP, BNP, troponin, PT/INR.  Will image patient utilizing CT chest abdomen pelvis, CT head, CT cervical spine, CT L-spine.  Patient given fentanyl for pain control.  CBC without  leukocytosis.  Hemoglobin decreased to 10.8.  1 month ago hemoglobin 13.  Patient CMP without electrolyte derangement, no elevated LFTs, anion gap 6.  Patient initial troponin 6, delta troponin pending.  PT/INR is within normal limits, patient reports compliance on his Eliquis.  BNP elevated at 578.5, provided with 40 mg IV Lasix for suspected CHF exacerbation.  Patient fecal occult card was collected.  Pending at this time. Patient CT scans are pending at this time.  At the end of my shift, patient workup not complete. Signed out to [employer]. Plan of management discussed.   Final Clinical Impression(s) / ED Diagnoses Final diagnoses:  Shortness of breath    Rx / DC Orders ED Discharge Orders     None         Al Decant, PA-C 03/15/24 1901    Rolan Bucco, MD 03/15/24 2354

## 2024-03-15 NOTE — ED Provider Notes (Signed)
  Physical Exam  BP 120/77 (BP Location: Right Arm)   Pulse (!) 109   Temp 97.7 F (36.5 C)   Resp 16   SpO2 94%   Physical Exam Vitals and nursing note reviewed.  Constitutional:      General: He is not in acute distress.    Appearance: Normal appearance. He is ill-appearing.  HENT:     Head: Normocephalic and atraumatic.  Eyes:     Extraocular Movements: Extraocular movements intact.     Conjunctiva/sclera: Conjunctivae normal.  Cardiovascular:     Rate and Rhythm: Tachycardia present.     Pulses: Normal pulses.  Pulmonary:     Effort: Pulmonary effort is normal. No tachypnea or respiratory distress.  Abdominal:     General: Abdomen is flat.  Skin:    General: Skin is warm and dry.  Neurological:     Mental Status: He is alert. Mental status is at baseline.  Psychiatric:        Mood and Affect: Mood normal.     Procedures  Procedures  ED Course / MDM   Clinical Course as of 03/15/24 2034  Sun Mar 15, 2024  1858 Anticipate admission. CHF exacerbation with more frequent falls, increased shortness of breath. Large ecchymotic area to L ribs. Severe lumbar pain. On eliquis. Pain currently controlled.  Last echo was 2 months ago 65-70%. He does not wish to come in.  [CB]    Clinical Course User Index [CB] Lunette Stands, PA-C   Medical Decision Making Amount and/or Complexity of Data Reviewed Labs: ordered. Radiology: ordered.  Risk Prescription drug management. Decision regarding hospitalization.   Pt care transferred over from [facility].   Plan at time of transfer was to follow up labs and imaging and to seek to admit for heart failure exacerbation.   CT chest showed aspiration vs pneumonia as well as rib fracture. Provided morphine for pain with relief, started on azithromycin and rocephin.  Saw the patient due to heart for exacerbation with rib fracture and possible aspiration versus pneumonia.  Patient already been given Lasix at this  time.  Pt care transferred over to Dr. Jiles Crocker.     Lunette Stands, [place] 03/15/24 2308    Rolan Bucco, MD 03/15/24 [phone]

## 2024-03-15 NOTE — Plan of Care (Signed)
 Plan of Care Note for accepted transfer  Patient: [name]              WUJ:[id number]  DOA: 03/15/2024     Facility requesting transfer: [facility] emergency department Requesting Provider: Dr. Fredderick Phenix  Reason for transfer: CHF exacerbation, questionable pneumonia and nondisplaced left seventh rib fracture from mechanical fall  ED triage note:Pt c/o SHOB, c/o L sided rib pain- "like my ribs are cracked." Onset "a couple days ago, off & on." Advises he called PCP & was told to come to ED. Endorses associated swelling to BLE  Facility course: 83 year old man history of essential hypertension, CAD, aortic stenosis status post TAVR, paroxysmal atrial fibrillation on Eliquis, history of CHF, and chronic back pain presented to emergency department complaining of shortness of breath. The patient reports that yesterday he had a fall, slid out of a chair.  Reports he struck the left side of his chest wall on a chair.  The patient comes in complaining of left-sided rib pain, left-sided chest pain, shortness of breath, abdominal pain.  Patient reports compliance on Eliquis.  Patient reports that he has not been taking Lasix for CHF as he was told not to on his last admission and discharge.  Patient states that shortness of breath was present prior to falling and striking the left chest wall.  He denies hitting his head or losing consciousness during this event.  Denies nausea, vomiting, fevers, dysuria.  He is endorsing weakness but denies lightheadedness or dizziness.  Sister here with patient to provide some history.  Patient also has exquisite lumbar spinal tenderness but denies any history of back pain.  Denies red flag symptoms of low back pain.   At presentation to ED patient is hemodynamically stable. Normal troponin level. FOBT negative. CMP unremarkable. CBC showing hemoglobin has been dropped to 10.8.  Baseline hemoglobin around 13-14.  Otherwise CBC unremarkable. Elevated BNP around 600. EKG  atrial fibrillation rate controlled to 93.  CT head showing chronic atrophy.  Chronic microvascular disease.  No acute finding.  CT cervical spine  Diffuse postsurgical and degenerative changes throughout the cervical spine. No acute bony abnormality.  CT chest abdomen pelvis: 1. Acute nondisplaced fracture of the left lateral seventh rib. No pneumothorax. 2. Small right and trace left pleural effusions. 3. Scattered patchy ground-glass opacities in the lower lobes with more dense consolidation in the right middle lobe. Findings are favored to represent infection or aspiration. 4. No acute traumatic injury in the abdomen or pelvis. 5. No acute fracture or traumatic listhesis in the lumbar spine. 6. Aortic Atherosclerosis (ICD10-I70.0).  CT lumbar spine no acute fracture or abnormality.  In the ED patient has been given IV ceftriaxone, azithromycin, Lasix 40 mg IV and morphine 2 mg.  Hospitalist has been consulted for further evaluation of CHF exacerbation, questionable pneumonia and nondisplaced left seventh rib fracture from mechanical fall   Plan of care: The patient is accepted for admission for inpatient status to Telemetry unit, at [facility].  [facility] will assume care on arrival to accepting facility. Until arrival, care as per EDP. However, [facility] available 24/7 for questions and assistance.   Check [email or web address] for on-call coverage.   Nursing staff, please call [facility] Admits & Consults System-Wide number under Amion on patient's arrival so appropriate admitting provider can evaluate the pt.    Author: Tereasa Coop, MD  03/15/2024  [facility]

## 2024-03-15 NOTE — ED Triage Notes (Addendum)
 Pt c/o SHOB, c/o L sided rib pain- "like my ribs are cracked." Onset "a couple days ago, off & on." Advises he called PCP & was told to come to ED.   Endorses associated swelling to BLE

## 2024-03-16 DIAGNOSIS — I700 Atherosclerosis of aorta: Secondary | ICD-10-CM | POA: Diagnosis present

## 2024-03-16 DIAGNOSIS — I5033 Acute on chronic diastolic (congestive) heart failure: Secondary | ICD-10-CM

## 2024-03-16 DIAGNOSIS — J9601 Acute respiratory failure with hypoxia: Secondary | ICD-10-CM | POA: Diagnosis not present

## 2024-03-16 DIAGNOSIS — D649 Anemia, unspecified: Secondary | ICD-10-CM | POA: Diagnosis not present

## 2024-03-16 DIAGNOSIS — S2232XA Fracture of one rib, left side, initial encounter for closed fracture: Secondary | ICD-10-CM | POA: Diagnosis present

## 2024-03-16 DIAGNOSIS — I4821 Permanent atrial fibrillation: Secondary | ICD-10-CM | POA: Diagnosis present

## 2024-03-16 DIAGNOSIS — E785 Hyperlipidemia, unspecified: Secondary | ICD-10-CM | POA: Diagnosis present

## 2024-03-16 DIAGNOSIS — Z8673 Personal history of transient ischemic attack (TIA), and cerebral infarction without residual deficits: Secondary | ICD-10-CM | POA: Diagnosis not present

## 2024-03-16 DIAGNOSIS — Z1152 Encounter for screening for COVID-19: Secondary | ICD-10-CM | POA: Diagnosis not present

## 2024-03-16 DIAGNOSIS — S8011XA Contusion of right lower leg, initial encounter: Secondary | ICD-10-CM | POA: Diagnosis present

## 2024-03-16 DIAGNOSIS — Z87891 Personal history of nicotine dependence: Secondary | ICD-10-CM | POA: Diagnosis not present

## 2024-03-16 DIAGNOSIS — Z7984 Long term (current) use of oral hypoglycemic drugs: Secondary | ICD-10-CM | POA: Diagnosis not present

## 2024-03-16 DIAGNOSIS — J690 Pneumonitis due to inhalation of food and vomit: Secondary | ICD-10-CM

## 2024-03-16 DIAGNOSIS — F1120 Opioid dependence, uncomplicated: Secondary | ICD-10-CM | POA: Diagnosis present

## 2024-03-16 DIAGNOSIS — Z952 Presence of prosthetic heart valve: Secondary | ICD-10-CM | POA: Diagnosis not present

## 2024-03-16 DIAGNOSIS — Y92019 Unspecified place in single-family (private) house as the place of occurrence of the external cause: Secondary | ICD-10-CM | POA: Diagnosis not present

## 2024-03-16 DIAGNOSIS — F32A Depression, unspecified: Secondary | ICD-10-CM | POA: Diagnosis present

## 2024-03-16 DIAGNOSIS — Z7901 Long term (current) use of anticoagulants: Secondary | ICD-10-CM | POA: Diagnosis not present

## 2024-03-16 DIAGNOSIS — R296 Repeated falls: Secondary | ICD-10-CM | POA: Diagnosis present

## 2024-03-16 DIAGNOSIS — M545 Low back pain, unspecified: Secondary | ICD-10-CM | POA: Diagnosis present

## 2024-03-16 DIAGNOSIS — I2510 Atherosclerotic heart disease of native coronary artery without angina pectoris: Secondary | ICD-10-CM | POA: Diagnosis present

## 2024-03-16 DIAGNOSIS — D509 Iron deficiency anemia, unspecified: Secondary | ICD-10-CM | POA: Diagnosis present

## 2024-03-16 DIAGNOSIS — R0602 Shortness of breath: Secondary | ICD-10-CM | POA: Diagnosis present

## 2024-03-16 DIAGNOSIS — Z96653 Presence of artificial knee joint, bilateral: Secondary | ICD-10-CM | POA: Diagnosis present

## 2024-03-16 DIAGNOSIS — I110 Hypertensive heart disease with heart failure: Secondary | ICD-10-CM | POA: Diagnosis present

## 2024-03-16 DIAGNOSIS — W07XXXA Fall from chair, initial encounter: Secondary | ICD-10-CM | POA: Diagnosis present

## 2024-03-16 DIAGNOSIS — Z79899 Other long term (current) drug therapy: Secondary | ICD-10-CM | POA: Diagnosis not present

## 2024-03-16 DIAGNOSIS — G894 Chronic pain syndrome: Secondary | ICD-10-CM | POA: Diagnosis present

## 2024-03-16 LAB — CBC
HCT: 34.5 % — ABNORMAL LOW (ref 39.0–52.0)
Hemoglobin: 11.4 g/dL — ABNORMAL LOW (ref 13.0–17.0)
MCH: 32.4 pg (ref 26.0–34.0)
MCHC: 33 g/dL (ref 30.0–36.0)
MCV: 98 fL (ref 80.0–100.0)
Platelets: 219 10*3/uL (ref 150–400)
RBC: 3.52 MIL/uL — ABNORMAL LOW (ref 4.22–5.81)
RDW: 15 % (ref 11.5–15.5)
WBC: 5.9 10*3/uL (ref 4.0–10.5)
nRBC: 0 % (ref 0.0–0.2)

## 2024-03-16 LAB — RETICULOCYTES
Immature Retic Fract: 20.6 % — ABNORMAL HIGH (ref 2.3–15.9)
RBC.: 3.53 MIL/uL — ABNORMAL LOW (ref 4.22–5.81)
Retic Count, Absolute: 53.3 10*3/uL (ref 19.0–186.0)
Retic Ct Pct: 1.5 % (ref 0.4–3.1)

## 2024-03-16 LAB — VITAMIN B12: Vitamin B-12: 326 pg/mL (ref 180–914)

## 2024-03-16 LAB — BASIC METABOLIC PANEL WITH GFR
Anion gap: 7 (ref 5–15)
BUN: 15 mg/dL (ref 8–23)
CO2: 26 mmol/L (ref 22–32)
Calcium: 9.1 mg/dL (ref 8.9–10.3)
Chloride: 104 mmol/L (ref 98–111)
Creatinine, Ser: 1.11 mg/dL (ref 0.61–1.24)
GFR, Estimated: >60 mL/min (ref >60)
Glucose, Bld: 106 mg/dL — ABNORMAL HIGH (ref 70–99)
Potassium: 4 mmol/L (ref 3.5–5.1)
Sodium: 137 mmol/L (ref 135–145)

## 2024-03-16 LAB — RESPIRATORY PANEL BY PCR

## 2024-03-16 LAB — IRON AND TIBC
Iron: 40 ug/dL — ABNORMAL LOW (ref 45–182)
Saturation Ratios: 12 % — ABNORMAL LOW (ref 17.9–39.5)
TIBC: 337 ug/dL (ref 250–450)
UIBC: 297 ug/dL

## 2024-03-16 LAB — SARS CORONAVIRUS 2 BY RT PCR: SARS Coronavirus 2 by RT PCR: NEGATIVE

## 2024-03-16 LAB — FERRITIN: Ferritin: 263 ng/mL (ref 24–336)

## 2024-03-16 LAB — FOLATE: Folate: 29 ng/mL (ref >5.9)

## 2024-03-16 MED ORDER — ATORVASTATIN CALCIUM 10 MG PO TABS
20.0000 mg | ORAL_TABLET | Freq: Every day | ORAL | Status: DC
  Administered 2024-03-16 – 2024-03-18 (×3): 20 mg via ORAL
  Filled 2024-03-16 (×3): qty 2

## 2024-03-16 MED ORDER — HYDROMORPHONE HCL 1 MG/ML IJ SOLN
1.0000 mg | INTRAMUSCULAR | Status: DC | PRN
  Administered 2024-03-16 – 2024-03-17 (×3): 1 mg via INTRAVENOUS
  Filled 2024-03-16 (×4): qty 1

## 2024-03-16 MED ORDER — OXYCODONE HCL 5 MG PO TABS
5.0000 mg | ORAL_TABLET | Freq: Four times a day (QID) | ORAL | Status: AC | PRN
  Administered 2024-03-16: 5 mg via ORAL
  Filled 2024-03-16: qty 1

## 2024-03-16 MED ORDER — NALOXONE HCL 0.4 MG/ML IJ SOLN: 0.4000 mg | INTRAMUSCULAR | Status: DC | PRN

## 2024-03-16 MED ORDER — SODIUM CHLORIDE 0.9 % IV SOLN
3.0000 g | Freq: Four times a day (QID) | INTRAVENOUS | Status: DC
  Administered 2024-03-16 – 2024-03-18 (×9): 3 g via INTRAVENOUS
  Filled 2024-03-16 (×9): qty 8

## 2024-03-16 MED ORDER — SIMVASTATIN 20 MG PO TABS: 40.0000 mg | ORAL_TABLET | Freq: Every day | ORAL | Status: DC

## 2024-03-16 MED ORDER — FUROSEMIDE 10 MG/ML IJ SOLN
40.0000 mg | Freq: Two times a day (BID) | INTRAMUSCULAR | Status: DC
  Administered 2024-03-16 – 2024-03-17 (×3): 40 mg via INTRAVENOUS
  Filled 2024-03-16 (×3): qty 4

## 2024-03-16 MED ORDER — ACETAMINOPHEN 325 MG PO TABS: 650.0000 mg | ORAL_TABLET | Freq: Four times a day (QID) | ORAL | Status: DC | PRN

## 2024-03-16 MED ORDER — HYDROMORPHONE HCL 1 MG/ML IJ SOLN
0.5000 mg | INTRAMUSCULAR | Status: DC | PRN
  Administered 2024-03-16 (×4): 0.5 mg via INTRAVENOUS
  Filled 2024-03-16 (×4): qty 0.5

## 2024-03-16 MED ORDER — SPIRONOLACTONE 25 MG PO TABS: 25.0000 mg | ORAL_TABLET | Freq: Every day | ORAL | Status: DC

## 2024-03-16 MED ORDER — ASPIRIN 81 MG PO TBEC
81.0000 mg | DELAYED_RELEASE_TABLET | Freq: Every day | ORAL | Status: DC
  Administered 2024-03-16: 81 mg via ORAL
  Filled 2024-03-16: qty 1

## 2024-03-16 NOTE — Evaluation (Signed)
 Occupational Therapy Evaluation Patient Details Name: [name] MRN: [id number] DOB: 1941-07-12 Today's Date: 03/16/2024   History of Present Illness   Pt is an 83 yo male admitted with SOB, BLE edema and L rib fx from fall a couple of days ago.  Pt with CHF and possible pneumonia.  PMH: HTN, CAD, sp TAVR, afib on eliquis, cva, multiple back surgeries.     Clinical Impressions PT admitted with the above diagnosis and has the deficits outlined below. Pt would benefit from cont OT to increase independence and safety with basic adls and adl transfers as to not burden his girlfriend whom he lives with.  Pt was previously modified independent w adls and driving but appears to be inconsistent taking medications and falls frequently. Pt is currently at mod assist level for LE adls due to pain from L 7th rib fx.  Will continue to see with focus on adls and functional mobility with hopes of home d/c with HHOT if girlfriend available to assist.  Pt on RA during session and SOB but O2 sats in 90s.     If plan is discharge home, recommend the following:   A little help with walking and/or transfers;A little help with bathing/dressing/bathroom;Supervision due to cognitive status     Functional Status Assessment   Patient has had a recent decline in their functional status and demonstrates the ability to make significant improvements in function in a reasonable and predictable amount of time.     Equipment Recommendations   None recommended by OT     Recommendations for Other Services         Precautions/Restrictions   Precautions Precautions: Fall Recall of Precautions/Restrictions: Intact Restrictions Weight Bearing Restrictions Per Provider Order: No     Mobility Bed Mobility Overal bed mobility: Needs Assistance Bed Mobility: Rolling, Sidelying to Sit Rolling: Contact guard assist, Used rails Sidelying to sit: Mod assist, Used rails, HOB elevated       General bed  mobility comments: Assist to get into full sitting position and  to scoot to EOB.    Transfers Overall transfer level: Needs assistance Equipment used: Rolling walker (2 wheels) Transfers: Sit to/from Stand, Bed to chair/wheelchair/BSC Sit to Stand: Min assist     Step pivot transfers: Min assist     General transfer comment: Cues for hand placement and to reach back to surface going to with R hand due to L rib pain.      Balance Overall balance assessment: Needs assistance Sitting-balance support: Feet supported Sitting balance-Leahy Scale: Good     Standing balance support: Bilateral upper extremity supported, During functional activity Standing balance-Leahy Scale: Fair Standing balance comment: Pt can support self in static standing.  Due to pain in dynamic standing, walker was helpful.                           ADL either performed or assessed with clinical judgement   ADL Overall ADL's : Needs assistance/impaired Eating/Feeding: Set up;Sitting   Grooming: Set up;Sitting   Upper Body Bathing: Set up;Sitting   Lower Body Bathing: Moderate assistance;Sit to/from stand;Cueing for compensatory techniques Lower Body Bathing Details (indicate cue type and reason): limited by pain Upper Body Dressing : Minimal assistance;Sitting   Lower Body Dressing: Moderate assistance;Sit to/from stand Lower Body Dressing Details (indicate cue type and reason): limited by pain Toilet Transfer: Minimal assistance;Stand-pivot;BSC/3in1;Rolling walker (2 wheels) Toilet Transfer Details (indicate cue type and reason): used walker  for stability Toileting- Clothing Manipulation and Hygiene: Moderate assistance;Sit to/from stand;Cueing for compensatory techniques Toileting - Clothing Manipulation Details (indicate cue type and reason): assist cleaning self and managing clothing     Functional mobility during ADLs: Minimal assistance;Rolling walker (2 wheels) General ADL Comments:  Pt most limited by pain and SOB. Pt O2 sats remanined above 90% entire session on RA. Cues to relax and use pursed lip breathing techniques.     Vision Baseline Vision/History: 0 No visual deficits Ability to See in Adequate Light: 0 Adequate Patient Visual Report: No change from baseline Vision Assessment?: No apparent visual deficits     Perception Perception: Within Functional Limits       Praxis Praxis: WFL       Pertinent Vitals/Pain Pain Assessment Pain Assessment: Faces Faces Pain Scale: Hurts whole lot Pain Location: L rib Pain Descriptors / Indicators: Aching, Grimacing, Jabbing Pain Intervention(s): Limited activity within patient's tolerance, Monitored during session, Repositioned, Patient requesting pain meds-RN notified, RN gave pain meds during session     Extremity/Trunk Assessment Upper Extremity Assessment Upper Extremity Assessment: Right hand dominant;RUE deficits/detail;LUE deficits/detail RUE Deficits / Details: Limited in shoulder flexion and abduction to 90 and has had B shoulder replacements RUE: Shoulder pain with ROM RUE Sensation: WNL RUE Coordination: decreased gross motor LUE Deficits / Details: Shoulder ROM limited due to replacement.  Limited to 90 flexion and abduction. LUE: Shoulder pain with ROM LUE Sensation: WNL LUE Coordination: decreased gross motor   Lower Extremity Assessment Lower Extremity Assessment: Defer to PT evaluation   Cervical / Trunk Assessment Cervical / Trunk Assessment: Back Surgery   Communication Communication Communication: No apparent difficulties   Cognition Arousal: Alert Behavior During Therapy: WFL for tasks assessed/performed Cognition: No family/caregiver present to determine baseline             OT - Cognition Comments: It took pt a while to determine what month it was even when given the cue that tomorrow is April Fools day.  Unsure of baseline. Pt LTM intact. Pt with decreased insight and  awareness of his deficits.                 Following commands: Intact       Cueing  General Comments   Cueing Techniques: Verbal cues  Pt is most limited by pain and SOB in areas of adls and mobility. Feel pt will improve with time and good pain management and girlfriend can assist at home if pt will allow her to.   Exercises     Shoulder Instructions      Home Living Family/patient expects to be discharged to:: Private residence Living Arrangements: Other relatives Available Help at Discharge: Family;Available 24 hours/day Type of Home: House Home Access: Level entry     Home Layout: One level     Bathroom Shower/Tub: Producer, television/film/video: Handicapped height Bathroom Accessibility: Yes How Accessible: Accessible via walker Home Equipment: Grab bars - toilet;Grab bars - tub/shower;Wheelchair - Forensic psychologist (2 wheels);Cane - single point;Lift chair;Shower seat - built in          Prior Functioning/Environment Prior Level of Function : Independent/Modified Independent;History of Falls (last six months);Driving             Mobility Comments: reports frequent falls. Uses cane at all times. ADLs Comments: independent ADLs, drives, manages iADLs (report stopping Lasix which seems to be a trend.    OT Problem List: Decreased strength;Decreased range of motion;Decreased activity tolerance;Impaired  balance (sitting and/or standing);Decreased cognition;Decreased safety awareness;Cardiopulmonary status limiting activity;Pain;Obesity;Impaired UE functional use;Increased edema   OT Treatment/Interventions: Self-care/ADL training;DME and/or AE instruction;Therapeutic activities;Balance training      OT Goals(Current goals can be found in the care plan section)   Acute Rehab OT Goals Patient Stated Goal: to get home to  my girl OT Goal Formulation: With patient Time For Goal Achievement: 03/30/24 Potential to Achieve Goals: Good ADL  Goals Pt Will Perform Grooming: with supervision;standing Pt Will Perform Lower Body Bathing: with supervision;sit to/from stand Pt Will Perform Upper Body Dressing: with set-up;sitting Pt Will Perform Lower Body Dressing: with supervision;with adaptive equipment;sit to/from stand Pt Will Perform Tub/Shower Transfer: Shower transfer;with contact guard assist;ambulating;shower seat;rolling walker;grab bars Additional ADL Goal #1: Pt will walk to bathroom with cane and complete all toileting with supervision. Additional ADL Goal #2: Pt will state two things he can do at home to attempt to conserve energy during adl routine with no cues.   OT Frequency:  Min 2X/week    Co-evaluation              AM-PAC OT "6 Clicks" Daily Activity     Outcome Measure Help from another person eating meals?: A Little Help from another person taking care of personal grooming?: A Little Help from another person toileting, which includes using toliet, bedpan, or urinal?: A Lot Help from another person bathing (including washing, rinsing, drying)?: A Lot Help from another person to put on and taking off regular upper body clothing?: A Little Help from another person to put on and taking off regular lower body clothing?: A Lot 6 Click Score: 15   End of Session Equipment Utilized During Treatment: Rolling walker (2 wheels) Nurse Communication: Mobility status  Activity Tolerance: Patient limited by pain Patient left: in chair;with call bell/phone within reach  OT Visit Diagnosis: Unsteadiness on feet (R26.81);History of falling (Z91.81);Pain Pain - Right/Left: Left Pain - part of body:  (rib)                Time: 1610-9604 OT Time Calculation (min): 37 min Charges:  OT General Charges $OT Visit: 1 Visit OT Evaluation $OT Eval Moderate Complexity: 1 Mod OT Treatments $Self Care/Home Management : 8-22 mins  Hope Budds 03/16/2024, 9:01 AM

## 2024-03-16 NOTE — Progress Notes (Signed)
 Pharmacy Antibiotic Note  RAYNOLD BLANKENBAKER is a 83 y.o. male admitted on 03/15/2024 with concern for aspiration pneumonia.  Pharmacy has been consulted for Unasyn dosing.  Plan: Unasyn 3g IV Q6H.  Weight: 109 kg (240 lb 4.8 oz)  Temp (24hrs), Avg:97.9 F (36.6 C), Min:97.7 F (36.5 C), Max:98 F (36.7 C)  Recent Labs  Lab 03/15/24 1636 03/15/24 1649  WBC 6.3  --   CREATININE  --  1.15    Estimated Creatinine Clearance: 61.3 mL/min (by C-G formula based on SCr of 1.15 mg/dL).    Allergies  Allergen Reactions   Lorazepam Other (See Comments)    Delirium   Duloxetine Other (See Comments)    Increased depression, irritability   Pregabalin Swelling and Other (See Comments)    Flashbacks, nightmares, aggression   Zanaflex [Tizanidine Hcl] Swelling and Anxiety    Thank you for allowing pharmacy to be a part of this patient's care.  Vernard Gambles, PharmD, BCPS  03/16/2024 6:19 AM

## 2024-03-16 NOTE — Evaluation (Signed)
 Physical Therapy Evaluation Patient Details Name: [name] MRN: [id number] DOB: 04/12/1941 Today's Date: 03/16/2024  History of Present Illness  Pt is an 83 yo male admitted with SOB, BLE edema and L rib fx from fall a couple of days ago.  Pt with CHF and possible pneumonia.  PMH: HTN, CAD, sp TAVR, afib on eliquis, cva, multiple back surgeries.  Clinical Impression  PTA, patient lived with his sister in one level home with no STE. Reports using a rollator occasionally for household ambulation, otherwise independent. He does report frequent falls, often tripping on objects. Patient presents today with generalized weakness and decreased safety awareness with functional mobility. Performs STS transfers with CGA and short distance gait with RW and CGA. Limited in mobility tolerance secondary to L rib pain, rated 8/10 but unable to receive pain medication for ~2 hours. Educated patient on benefits of splinting pillow to manage pain. Patient also educated on safe mobilization, use of DME, and fall prevention strategies. Patient will benefit from skilled acute PT services to address the above impairments. Recommend d/c home with HHPT.      If plan is discharge home, recommend the following: A little help with walking and/or transfers;A little help with bathing/dressing/bathroom   Can travel by private vehicle        Equipment Recommendations Rolling walker (2 wheels)  Recommendations for Other Services       Functional Status Assessment Patient has had a recent decline in their functional status and demonstrates the ability to make significant improvements in function in a reasonable and predictable amount of time.     Precautions / Restrictions Precautions Precautions: Fall;Other (comment) (L 7th rib fracture)      Mobility  Bed Mobility  Not assessed, patient met seated upright in chair.                  Transfers Overall transfer level: Needs assistance Equipment used:  Rolling walker (2 wheels) Transfers: Sit to/from Stand Sit to Stand: Contact guard assist           General transfer comment: cues for hand placement; increased time for transitions secondary to 8/10 rib pain    Ambulation/Gait Ambulation/Gait assistance: Contact guard assist Gait Distance (Feet): 20 Feet Assistive device: Rolling walker (2 wheels) Gait Pattern/deviations: Decreased stride length, Wide base of support, Trunk flexed   Gait velocity interpretation: <1.31 ft/sec, indicative of household ambulator   General Gait Details: slow gait speed, increased time for change of direction       Balance Overall balance assessment: Needs assistance Sitting-balance support: Feet supported Sitting balance-Leahy Scale: Good     Standing balance support: Bilateral upper extremity supported, Reliant on assistive device for balance Standing balance-Leahy Scale: Poor Standing balance comment: limited by rib pain                             Pertinent Vitals/Pain Pain Assessment Faces Pain Scale: Hurts whole lot Pain Location: L rib Pain Intervention(s): Limited activity within patient's tolerance, Monitored during session, Patient requesting pain meds-RN notified (RN reported patient not due for pain medicine until 12pm)    Home Living Family/patient expects to be discharged to:: Private residence Living Arrangements: Other relatives;Other (Comment) (sister) Available Help at Discharge: Family;Available 24 hours/day Type of Home: House Home Access: Level entry       Home Layout: One level Home Equipment: Wheelchair - Forensic psychologist (2 wheels);Cane - single point;Rollator (4 wheels)  Prior Function Prior Level of Function : Independent/Modified Independent;History of Falls (last six months);Driving;Other (comment) (uses rollator occasionally for household ambulation)             Mobility Comments: reports frequent falls ADLs Comments:  independent ADLs, drives, manages iADLs (report stopping Lasix which seems to be a trend.     Extremity/Trunk Assessment   Upper Extremity Assessment Upper Extremity Assessment: Right hand dominant;RUE deficits/detail;LUE deficits/detail RUE Deficits / Details: Limited in shoulder flexion and abduction to 90 and has had B shoulder replacements RUE: Shoulder pain with ROM RUE Sensation: WNL RUE Coordination: decreased gross motor LUE Deficits / Details: Shoulder ROM limited due to replacement.  Limited to 90 flexion and abduction. LUE: Shoulder pain with ROM LUE Sensation: WNL LUE Coordination: decreased gross motor    Lower Extremity Assessment Lower Extremity Assessment: Generalized weakness    Cervical / Trunk Assessment Cervical / Trunk Assessment: Back Surgery  Communication   Communication Communication: No apparent difficulties    Cognition Arousal: Alert Behavior During Therapy: WFL for tasks assessed/performed                             Following commands: Intact       Cueing Cueing Techniques: Verbal cues     General Comments General comments (skin integrity, edema, etc.): Pt is most limited by pain and SOB in areas of adls and mobility. Feel pt will improve with time and good pain management and girlfriend can assist at home if pt will allow her to.    Exercises     Assessment/Plan    PT Assessment Patient needs continued PT services  PT Problem List Decreased strength;Decreased activity tolerance;Decreased safety awareness;Decreased balance;Decreased mobility;Pain       PT Treatment Interventions DME instruction;Gait training;Neuromuscular re-education;Patient/family education;Functional mobility training;Therapeutic activities;Therapeutic exercise;Balance training    PT Goals (Current goals can be found in the Care Plan section)  Acute Rehab PT Goals Patient Stated Goal: to get home PT Goal Formulation: With patient Time For Goal  Achievement: 03/30/24 Potential to Achieve Goals: Good    Frequency Min 3X/week        AM-PAC PT "6 Clicks" Mobility  Outcome Measure Help needed turning from your back to your side while in a flat bed without using bedrails?: A Little Help needed moving from lying on your back to sitting on the side of a flat bed without using bedrails?: A Little Help needed moving to and from a bed to a chair (including a wheelchair)?: A Little Help needed standing up from a chair using your arms (e.g., wheelchair or bedside chair)?: A Little Help needed to walk in hospital room?: A Little Help needed climbing 3-5 steps with a railing? : A Lot 6 Click Score: 17    End of Session Equipment Utilized During Treatment: Gait belt Activity Tolerance: Patient limited by pain Patient left: in chair;with call bell/phone within reach Nurse Communication: Mobility status;Patient requests pain meds PT Visit Diagnosis: Unsteadiness on feet (R26.81);Other abnormalities of gait and mobility (R26.89)    Time: 1010-1037 PT Time Calculation (min) (ACUTE ONLY): 27 min   Charges:   PT Evaluation $PT Eval Low Complexity: 1 Low PT Treatments $Therapeutic Activity: 8-22 mins PT General Charges $$ ACUTE PT VISIT: 1 Visit         Lanora Manis, PT, DPT  Ruben Gottron 03/16/2024, 11:36 AM

## 2024-03-16 NOTE — H&P (Signed)
 History and Physical    [name] ZOX:[id number] DOB: 06-02-1941 DOA: 03/15/2024  PCP: Dois Davenport, MD  Patient coming from: [facility] ED  Chief Complaint: Shortness of breath  HPI: [name] is a 83 y.o. male with medical history significant of hypertension, CAD, chronic HFpEF, aortic stenosis status post TAVR, chronic A-fib on Eliquis, history of CVA, chronic back pain/history of multiple back surgeries, depression.  Patient was admitted last month 2/12-2/14 for A-fib with RVR.  Patient presented to [facility] for evaluation of shortness of breath, bilateral lower extremity edema, left-sided chest/rib pain after a fall, and abdominal/low back pain.  In the ED, patient was mildly tachycardic but remainder of vital signs stable.  Labs showing no leukocytosis, hemoglobin 10.8 (previously 13-14 on labs 6 weeks ago), MCV 100.0, FOBT negative, normal LFTs, troponin negative x 2, BNP 578.  CT head negative for acute intracranial abnormality.  CT chest showing acute nondisplaced fracture of the left lateral seventh rib without evidence of pneumothorax, small right and trace left pleural effusions, and scattered patchy groundglass opacities in the lower lobes with more dense consolidation in the right middle lobe suspicious for infection or aspiration.  CT abdomen pelvis and CT cervical and lumbar spine negative for acute traumatic injury.  EKG showing A-fib and no acute ischemic changes. Patient was given Eliquis, fentanyl, IV Lasix 40 mg, oral metoprolol, morphine, ceftriaxone, and azithromycin in the ED.  Patient is reporting 1 week history of worsening dyspnea and bilateral lower extremity edema.  Having slight cough.  He is no longer taking Lasix at home as it was making him urinate a lot/frequent trips to the bathroom.  A few days ago he was outside when he lost his balance and fell hitting the left side of his chest against a large flowerpot.  Since then he is having severe left lateral rib  pain.  Denies head injury or any other injuries related to this fall.  Denies nausea, vomiting, or abdominal pain.  Review of Systems:  Review of Systems  All other systems reviewed and are negative.   Past Medical History:  Diagnosis Date   Cancer [facility])    Carpal tunnel syndrome    Cervical spondylosis with myelopathy    Chronic pain syndrome    Depression    Difficult intubation    needs smaller tube; awake oral fiberoptic scope 8.0 ETT 03/30/08   Dysphagia, pharyngeal phase    Hemorrhage of gastrointestinal tract, unspecified    History of cardiac cath    a. 03/2018 Cath: LAD and LCX with mild luminal irregularities.  No significant disease.   Iron deficiency anemia, unspecified    Lumbosacral spondylosis without myelopathy    Muscle weakness (generalized)    Other and unspecified disc disorder of cervical region    Other and unspecified hyperlipidemia    Patent foramen ovale    a. 05/2018 Echo: post-op TAVR--> + bublble study w/ L->R shunt.   Permanent atrial fibrillation (HCC)    a.  Diagnosed in the spring 2019.  Had rapid atrial fibrillation following TAVR and has been rate controlled with beta-blocker.  CHA2DS2VASc equals 7.  Supposed to be on Eliquis.   Primary localized osteoarthrosis, lower leg    Primary localized osteoarthrosis, shoulder region    Reflux esophagitis    Severe aortic stenosis    a. mild-mod AS by 01/2016 TTE, restricted AV opening with mod AR 01/2016 TEE; b. 10/2017 Echo: EF 60-65%, sev Ca2+ AoV, mean grad ; c.  05/2018 s/p TAVR ([facility]); d. 06/2018 Echo: EF 55-60%, well-seated prosth AoV, peak velocity 156cm/s. AoV gradient .   Spinal stenosis, unspecified region other than cervical    Stroke (HCC)    01/2016   Syncope and collapse    Unspecified arthropathy, lower leg    Unspecified constipation    Unspecified essential hypertension    Unspecified glaucoma(365.9)     Past Surgical History:  Procedure Laterality Date   BILATERAL CATARACT  SURGERY  2009   DR GROAT    BIOPSY  10/14/2018   Procedure: BIOPSY;  Surgeon: Graylin Shiver, MD;  Location: [facility] ENDOSCOPY;  Service: Endoscopy;;   CARDIAC VALVE REPLACEMENT  05/2018   CERVIACAL SPINE (4X)     DR MARK ROY    COLONOSCOPY  2007   DR HENSEL    COLONOSCOPY WITH PROPOFOL N/A 11/20/2017   Procedure: COLONOSCOPY WITH PROPOFOL;  Surgeon: Charlott Rakes, MD;  Location: [facility] ENDOSCOPY;  Service: Endoscopy;  Laterality: N/A;   ESOPHAGOGASTRODUODENOSCOPY (EGD) WITH PROPOFOL N/A 10/14/2018   Procedure: ESOPHAGOGASTRODUODENOSCOPY (EGD) WITH PROPOFOL;  Surgeon: Graylin Shiver, MD;  Location: [facility] ENDOSCOPY;  Service: Endoscopy;  Laterality: N/A;   JOINT REPLACEMENT     both knees   LEFT KNEE REPLACEMENT     DR ALUSIO   LEFT TRANSVERSE CARPAL LIGAMENT  01/06/2008   ROTATOR CUFF LEFT SHOULDER  2001   DR MURPHY    SHOULDER OPEN ROTATOR CUFF REPAIR  2006   DR [facility]   SPINE SURGERY     neck fusion     reports that he quit smoking about 38 years ago. His smoking use included cigarettes. He quit smokeless tobacco use about 39 years ago. He reports that he does not drink alcohol and does not use drugs.  Allergies  Allergen Reactions   Lorazepam Other (See Comments)    Delirium   Duloxetine Other (See Comments)    Increased depression, irritability   Pregabalin Swelling and Other (See Comments)    Flashbacks, nightmares, aggression   Zanaflex [Tizanidine Hcl] Swelling and Anxiety    Family History  Problem Relation Age of Onset   Cancer Mother 55       cervical cancer   Cancer Father 49       lung  cancer    Prior to Admission medications   Medication Sig Start Date End Date Taking? Authorizing Provider  apixaban (ELIQUIS) 5 MG TABS tablet Take 5 mg by mouth 2 (two) times daily.    [provider]  aspirin EC 81 MG tablet Take 81 mg by mouth daily.    [provider]  buPROPion (WELLBUTRIN) 100 MG tablet Take 100 mg by mouth 2 (two) times daily.     [provider]  cyanocobalamin (,VITAMIN B-12,) 1000 MCG/ML injection Inject 1 mL into the muscle every 28 (twenty-eight) days. 03/15/20   [provider]  diltiazem (CARDIZEM CD) 120 MG 24 hr capsule Take 1 capsule (120 mg total) by mouth daily. 10/09/19   Kathlen Mody, MD  ferrous sulfate 325 (65 FE) MG tablet Take 325 mg by mouth daily with breakfast. 03/15/20   [provider]  fluticasone (FLONASE) 50 MCG/ACT nasal spray Place 1 spray into both nostrils as directed. 01/24/24   [provider]  furosemide (LASIX) 20 MG tablet Take 20 mg by mouth daily. May take one additional tablet as needed for leg swelling.    [provider]  gabapentin (NEURONTIN) 400 MG capsule Take 1 capsule (400 mg  total) by mouth 3 (three) times daily. 12/03/23   Marinda Elk, MD  gabapentin (NEURONTIN) 800 MG tablet Take 800 mg by mouth 3 (three) times daily. 01/02/24   [provider]  lidocaine (LIDODERM) 5 % Place 1 patch onto the skin daily. 01/18/24   [provider]  metoprolol succinate (TOPROL-XL) 25 MG 24 hr tablet Take 25 mg by mouth daily.    [provider]  oxyCODONE-acetaminophen (PERCOCET) 10-325 MG tablet Take 1 tablet by mouth 5 (five) times daily as needed for pain.    [provider]  polyethylene glycol (MIRALAX / GLYCOLAX) packet Take 17 g by mouth daily as needed for mild constipation.    [provider]  potassium chloride (K-DUR,KLOR-CON) 20 MEQ tablet Take 1 tablet (20 mEq total) by mouth daily. Patient taking differently: Take 20 mEq by mouth at bedtime. 09/20/18   Strader, Lennart Pall, PA-C  rOPINIRole (REQUIP) 0.25 MG tablet Take 0.25 mg by mouth 2 (two) times daily.  10/09/18   [provider]  silver sulfADIAZINE (SILVADENE) 1 % cream Apply 1 Application topically. 11/25/23   [provider]  simvastatin (ZOCOR) 40 MG tablet Take 40 mg by mouth at bedtime.    [provider]   spironolactone (ALDACTONE) 25 MG tablet Take 25 mg by mouth daily.    [provider]  tamsulosin (FLOMAX) 0.4 MG CAPS capsule Take 0.8 mg by mouth daily. 09/02/19   [provider]  topiramate (TOPAMAX) 25 MG capsule Take 25 mg by mouth 2 (two) times daily.    [provider]    Physical Exam: Vitals:   03/15/24 2300 03/15/24 2330 03/16/24 0045 03/16/24 0440  BP: 114/72 123/82 120/78 115/66  Pulse: 91 95 88 95  Resp: 15 14 (!) 22 16  Temp:   97.8 F (36.6 C) 98 F (36.7 C)  TempSrc:   Oral Oral  SpO2: 96% 95% 96% 92%  Weight:   109 kg 109 kg    Physical Exam Vitals reviewed.  Constitutional:      General: He is not in acute distress. HENT:     Head: Normocephalic and atraumatic.  Eyes:     Extraocular Movements: Extraocular movements intact.  Cardiovascular:     Rate and Rhythm: Normal rate and regular rhythm.     Pulses: Normal pulses.  Pulmonary:     Effort: Pulmonary effort is normal. No respiratory distress.     Breath sounds: No wheezing, rhonchi or rales.  Abdominal:     General: Bowel sounds are normal.     Palpations: Abdomen is soft.     Tenderness: There is no abdominal tenderness. There is no guarding.  Musculoskeletal:     Cervical back: Normal range of motion.     Right lower leg: Edema present.     Left lower leg: Edema present.     Comments: 2+ pitting edema of bilateral lower extremities Right lower leg appears erythematous and warm to touch  Skin:    General: Skin is warm and dry.  Neurological:     General: No focal deficit present.     Mental Status: He is alert and oriented to person, place, and time.     Labs on Admission: I have personally reviewed following labs and imaging studies  CBC: Recent Labs  Lab 03/15/24 1636  WBC 6.3  HGB 10.8*  HCT 33.9*  MCV 100.0  PLT 194   Basic Metabolic Panel: Recent Labs  Lab 03/15/24 1649  NA 135  K 4.2  CL 101  CO2 28  GLUCOSE 101*  BUN 22  CREATININE 1.15   CALCIUM 8.8*   GFR: Estimated Creatinine Clearance: 61.3 mL/min (by C-G formula based on SCr of 1.15 mg/dL). Liver Function Tests: Recent Labs  Lab 03/15/24 1649  AST 26  ALT 19  ALKPHOS 73  BILITOT 0.5  PROT 6.9  ALBUMIN 3.5   No results for input(s): "LIPASE", "AMYLASE" in the last 168 hours. No results for input(s): "AMMONIA" in the last 168 hours. Coagulation Profile: Recent Labs  Lab 03/15/24 1636  INR 1.0   Cardiac Enzymes: No results for input(s): "CKTOTAL", "CKMB", "CKMBINDEX", "TROPONINI" in the last 168 hours. BNP (last 3 results) No results for input(s): "PROBNP" in the last 8760 hours. HbA1C: No results for input(s): "HGBA1C" in the last 72 hours. CBG: No results for input(s): "GLUCAP" in the last 168 hours. Lipid Profile: No results for input(s): "CHOL", "HDL", "LDLCALC", "TRIG", "CHOLHDL", "LDLDIRECT" in the last 72 hours. Thyroid Function Tests: No results for input(s): "TSH", "T4TOTAL", "FREET4", "T3FREE", "THYROIDAB" in the last 72 hours. Anemia Panel: No results for input(s): "VITAMINB12", "FOLATE", "FERRITIN", "TIBC", "IRON", "RETICCTPCT" in the last 72 hours. Urine analysis:    Component Value Date/Time   COLORURINE YELLOW 01/29/2024 2122   APPEARANCEUR CLEAR 01/29/2024 2122   LABSPEC 1.024 01/29/2024 2122   PHURINE 7.0 01/29/2024 2122   GLUCOSEU NEGATIVE 01/29/2024 2122   HGBUR NEGATIVE 01/29/2024 2122   HGBUR negative 04/08/2007 1327   BILIRUBINUR NEGATIVE 01/29/2024 2122   KETONESUR 5 (A) 01/29/2024 2122   PROTEINUR NEGATIVE 01/29/2024 2122   UROBILINOGEN 0.2 04/24/2010 1617   NITRITE NEGATIVE 01/29/2024 2122   LEUKOCYTESUR NEGATIVE 01/29/2024 2122    Radiological Exams on Admission: CT CHEST ABDOMEN PELVIS W CONTRAST Result Date: 03/15/2024 CLINICAL DATA:  Shortness of breath, left-sided rib pain since a couple days ago. Bilateral lower extremity swelling. EXAM: CT CHEST, ABDOMEN, AND PELVIS WITH CONTRAST CT lumbar spine with contrast  TECHNIQUE: Multidetector CT imaging of the chest, abdomen and pelvis was performed following the standard protocol during bolus administration of intravenous contrast. Multiplanar CT images of the lumbar spine were reconstructed from contemporary CT of the Chest, Abdomen, and Pelvis. RADIATION DOSE REDUCTION: This exam was performed according to the departmental dose-optimization program which includes automated exposure control, adjustment of the mA and/or kV according to patient size and/or use of iterative reconstruction technique. CONTRAST:  OMNIPAQUE IOHEXOL 300 MG/ML  SOLN COMPARISON:  CT 01/29/2024 FINDINGS: CT CHEST FINDINGS Cardiovascular: No pericardial effusion. TAVR. Coronary artery and aortic atherosclerotic calcification Mediastinum/Nodes: Trachea and esophagus are unremarkable. No thoracic adenopathy. Lungs/Pleura: Small right and trace left pleural effusions. Bronchial wall thickening greatest in the lower lungs. There are scattered patchy ground-glass opacities in the lower lobes. More dense consolidation in the right middle lobe. No pneumothorax. Musculoskeletal: Acute nondisplaced fracture of the left lateral seventh rib. Similar subacute-chronic fractures of the left posterolateral eighth and ninth ribs. Posterior fusion T10-T11. CT ABDOMEN PELVIS FINDINGS Hepatobiliary: No acute abnormality. Pancreas: Fatty atrophy.  No acute abnormality. Spleen: Unremarkable. Adrenals/Urinary Tract: Normal adrenal glands. Nonobstructing punctate right nephrolithiasis. No obstructing ureteral calculi or hydronephrosis. Posterior and left-sided bladder wall diverticula. Stomach/Bowel: Normal caliber large and small bowel. No bowel wall thickening. Stomach and appendix are within normal limits. Vascular/Lymphatic: Aortic atherosclerosis. No enlarged abdominal or pelvic lymph nodes. Reproductive: Unremarkable. Other: No free intraperitoneal fluid or air. Fat containing ventral abdominal wall hernias.  Musculoskeletal: No acute fracture CT LUMBAR  SPINE FINDINGS Segmentation: 5 lumbar type vertebrae. Alignment: No evidence of traumatic listhesis. Vertebrae: No acute fracture. Paraspinal and other soft tissues: See above. Disc levels: Multilevel spondylosis with bulky anterior osteophytes, disc space height loss with vacuum phenomenon, and degenerative endplate changes. Degenerative endplate changes are greatest at L2-L3. Advanced facet arthropathy at L4-L5 and L5-S1. No severe spinal canal narrowing. IMPRESSION: 1. Acute nondisplaced fracture of the left lateral seventh rib. No pneumothorax. 2. Small right and trace left pleural effusions. 3. Scattered patchy ground-glass opacities in the lower lobes with more dense consolidation in the right middle lobe. Findings are favored to represent infection or aspiration. 4. No acute traumatic injury in the abdomen or pelvis. 5. No acute fracture or traumatic listhesis in the lumbar spine. 6. Aortic Atherosclerosis (ICD10-I70.0). Electronically Signed   By: Minerva Fester M.D.   On: 03/15/2024 19:11   CT L-SPINE NO CHARGE Result Date: 03/15/2024 CLINICAL DATA:  Shortness of breath, left-sided rib pain since a couple days ago. Bilateral lower extremity swelling. EXAM: CT CHEST, ABDOMEN, AND PELVIS WITH CONTRAST CT lumbar spine with contrast TECHNIQUE: Multidetector CT imaging of the chest, abdomen and pelvis was performed following the standard protocol during bolus administration of intravenous contrast. Multiplanar CT images of the lumbar spine were reconstructed from contemporary CT of the Chest, Abdomen, and Pelvis. RADIATION DOSE REDUCTION: This exam was performed according to the departmental dose-optimization program which includes automated exposure control, adjustment of the mA and/or kV according to patient size and/or use of iterative reconstruction technique. CONTRAST:  OMNIPAQUE IOHEXOL 300 MG/ML  SOLN COMPARISON:  CT 01/29/2024 FINDINGS: CT CHEST  FINDINGS Cardiovascular: No pericardial effusion. TAVR. Coronary artery and aortic atherosclerotic calcification Mediastinum/Nodes: Trachea and esophagus are unremarkable. No thoracic adenopathy. Lungs/Pleura: Small right and trace left pleural effusions. Bronchial wall thickening greatest in the lower lungs. There are scattered patchy ground-glass opacities in the lower lobes. More dense consolidation in the right middle lobe. No pneumothorax. Musculoskeletal: Acute nondisplaced fracture of the left lateral seventh rib. Similar subacute-chronic fractures of the left posterolateral eighth and ninth ribs. Posterior fusion T10-T11. CT ABDOMEN PELVIS FINDINGS Hepatobiliary: No acute abnormality. Pancreas: Fatty atrophy.  No acute abnormality. Spleen: Unremarkable. Adrenals/Urinary Tract: Normal adrenal glands. Nonobstructing punctate right nephrolithiasis. No obstructing ureteral calculi or hydronephrosis. Posterior and left-sided bladder wall diverticula. Stomach/Bowel: Normal caliber large and small bowel. No bowel wall thickening. Stomach and appendix are within normal limits. Vascular/Lymphatic: Aortic atherosclerosis. No enlarged abdominal or pelvic lymph nodes. Reproductive: Unremarkable. Other: No free intraperitoneal fluid or air. Fat containing ventral abdominal wall hernias. Musculoskeletal: No acute fracture CT LUMBAR SPINE FINDINGS Segmentation: 5 lumbar type vertebrae. Alignment: No evidence of traumatic listhesis. Vertebrae: No acute fracture. Paraspinal and other soft tissues: See above. Disc levels: Multilevel spondylosis with bulky anterior osteophytes, disc space height loss with vacuum phenomenon, and degenerative endplate changes. Degenerative endplate changes are greatest at L2-L3. Advanced facet arthropathy at L4-L5 and L5-S1. No severe spinal canal narrowing. IMPRESSION: 1. Acute nondisplaced fracture of the left lateral seventh rib. No pneumothorax. 2. Small right and trace left pleural  effusions. 3. Scattered patchy ground-glass opacities in the lower lobes with more dense consolidation in the right middle lobe. Findings are favored to represent infection or aspiration. 4. No acute traumatic injury in the abdomen or pelvis. 5. No acute fracture or traumatic listhesis in the lumbar spine. 6. Aortic Atherosclerosis (ICD10-I70.0). Electronically Signed   By: Minerva Fester M.D.   On: 03/15/2024 19:11   CT Cervical  Spine Wo Contrast Result Date: 03/15/2024 CLINICAL DATA:  Neck trauma (Age >= 65y) EXAM: CT CERVICAL SPINE WITHOUT CONTRAST TECHNIQUE: Multidetector CT imaging of the cervical spine was performed without intravenous contrast. Multiplanar CT image reconstructions were also generated. RADIATION DOSE REDUCTION: This exam was performed according to the departmental dose-optimization program which includes automated exposure control, adjustment of the mA and/or kV according to patient size and/or use of iterative reconstruction technique. COMPARISON:  None Available. FINDINGS: Alignment: 9 mm of degenerative anterolisthesis of C6 on C7. 4 mm of degenerative anterolisthesis of C7 on T1. Findings stable since prior study. Skull base and vertebrae: No acute fracture. No primary bone lesion or focal pathologic process. Soft tissues and spinal canal: No prevertebral fluid or swelling. No visible canal hematoma. Disc levels: ACDF at C6-7. Posterior fusion with posterior spinal rods extending from the occipital region into the upper thoracic spine. Fusion across the facet joints diffusely. Advanced diffuse degenerative disc disease. Upper chest: Bilateral pleural effusions partially visualized. Other: None IMPRESSION: Diffuse postsurgical and degenerative changes throughout the cervical spine. No acute bony abnormality. Bilateral pleural effusions partially visualized Electronically Signed   By: Charlett Nose M.D.   On: 03/15/2024 19:02   CT Head Wo Contrast Result Date: 03/15/2024 CLINICAL DATA:   Head trauma, moderate-severe EXAM: CT HEAD WITHOUT CONTRAST TECHNIQUE: Contiguous axial images were obtained from the base of the skull through the vertex without intravenous contrast. RADIATION DOSE REDUCTION: This exam was performed according to the departmental dose-optimization program which includes automated exposure control, adjustment of the mA and/or kV according to patient size and/or use of iterative reconstruction technique. COMPARISON:  None Available. FINDINGS: Brain: There is atrophy and chronic small vessel disease changes. No acute intracranial abnormality. Specifically, no hemorrhage, hydrocephalus, mass lesion, acute infarction, or significant intracranial injury. Vascular: No hyperdense vessel or unexpected calcification. Skull: No acute calvarial abnormality. Sinuses/Orbits: No acute findings Other: None IMPRESSION: Atrophy, chronic microvascular disease. No acute intracranial abnormality. Electronically Signed   By: Charlett Nose M.D.   On: 03/15/2024 18:53    Assessment and Plan  Acute on chronic HFpEF He is not taking Lasix at home.  BNP 578.  CT showing small right and trace left pleural effusions.  He has bilateral lower extremity edema on exam.  Recent echo done 6 weeks ago showing preserved EF and normal structure and function of the prosthetic aortic valve.  Continue diuresis with IV Lasix 40 mg twice daily.  Monitor intake and output, daily weights, renal function, and electrolytes.  Low-sodium diet with fluid restriction.  Supplemental oxygen as needed to keep oxygen saturation above 92%.  Possible aspiration pneumonia No fever or leukocytosis.  CT showing scattered patchy groundglass opacities in the lower lobes with more dense consolidation in the right middle lobe suspicious for infection or aspiration.  Patient is not hypoxic, currently satting in the high 90s on room air.  SARS-CoV-2 PCR/respiratory viral panel ordered.  Continue antibiotic coverage with Unasyn.   Supplemental oxygen as needed to keep oxygen saturation above 92%.  Acute nondisplaced fracture of the left lateral seventh rib Secondary to a mechanical fall.  No evidence of pneumothorax on imaging.  Continue pain management.  PT/OT eval, fall precautions.  Anemia Hemoglobin 10.8 (previously 13-14 on labs 6 weeks ago), MCV 100.0, FOBT negative.  He is on Eliquis but no obvious bleeding identified on trauma scans including CT head/C-spine/chest/abdomen/pelvis/L-spine.  Anemia panel ordered and continue to monitor H&H.  Possible cellulitis of right lower extremity His right  lower leg appears erythematous and warm to touch.  DVT less likely as he is not endorsing any lower extremity pain and reports compliance with Eliquis.  Continue antibiotic.  Hypertension Stable.  Continue diltiazem and metoprolol.  CAD Workup not suggestive of ACS.  Continue aspirin, metoprolol, and simvastatin.  Chronic A-fib Currently rate controlled.  Continue Eliquis, diltiazem, and metoprolol.  DVT prophylaxis: Eliquis Code Status: Full Code (discussed with the patient) Family Communication: No family available at this time. Level of care: Telemetry bed Admission status: It is my clinical opinion that admission to INPATIENT is reasonable and necessary because of the expectation that this patient will require hospital care that crosses at least 2 midnights to treat this condition based on the medical complexity of the problems presented.  Given the aforementioned information, the predictability of an adverse outcome is felt to be significant.  John Giovanni MD [facility]  If 7PM-7AM, please contact night-coverage [email or web address]  03/16/2024, 5:05 AM

## 2024-03-16 NOTE — TOC Initial Note (Addendum)
 Transition of Care [facility]) - Initial/Assessment Note    Patient Details  Name: [name] MRN: [id number] Date of Birth: April 02, 1941  Transition of Care [facility]) CM/SW Contact:    Leone Haven, RN Phone Number: 03/16/2024, 1:07 PM  Clinical Narrative:                 From home with sister, Hermenia Fiscal, he gives this NCM permission to speak with her, he has P[facility] , [facility] and insurance on file, states has no HH services in place at this time , has walker and a cane at home.  States Hermenia Fiscal will transport them home at [employer] and family is support system, [facility] [address].  Pta self ambulatory with walker or cane.  NCM offered choice for HHPT, HHOT, HHRN, he states he has no preference,  NCM made referral to [facility] with Frances Furbish, he is able to take referral.  Soc will begin when authorization is given to [place] from the [place]. NCM left vm for April with [facility] of admit date.  Per April , Dr. Wallace Cullens is PCP and CSW is Eber Hong [phone].  Expected Discharge Plan: Home w Home Health Services Barriers to Discharge: Continued Medical Work up   Patient Goals and CMS Choice Patient states their goals for this hospitalization and ongoing recovery are:: return home CMS Medicare.gov Compare Post Acute Care list provided to:: Patient Choice offered to / list presented to : Patient      Expected Discharge Plan and Services   Discharge Planning Services: CM Consult Post Acute Care Choice: Home Health Living arrangements for the past 2 months: Single Family Home                 DME Arranged: N/A DME Agency: NA, Adult [facility] Pediatric Services       HH Arranged: RN, PT, OT HH Agency: [facility] Date [facility] Agency Contacted: 03/16/24 Time HH Agency Contacted: [phone] Representative spoke with at [facility] Agency: Kandee Keen  Prior Living Arrangements/Services Living arrangements for the past 2 months: Single Family  Home Lives with:: Siblings (sister , Hermenia Fiscal) Patient language and need for interpreter reviewed:: Yes Do you feel safe going back to the place where you live?: Yes      Need for Family Participation in Patient Care: Yes (Comment) Care giver support system in place?: Yes (comment) Current home services: DME (walker and a cane) Criminal Activity/Legal Involvement Pertinent to Current Situation/Hospitalization: No - Comment as needed  Activities of Daily Living      Permission Sought/Granted Permission sought to share information with : Case Manager Permission granted to share information with : Yes, Verbal Permission Granted  Share Information with NAME: Mickel Baas  Permission granted to share info w AGENCY: [facility]  Permission granted to share info w Relationship: sister  Permission granted to share info w Contact Information: [phone]  Emotional Assessment Appearance:: Appears stated age Attitude/Demeanor/Rapport: Engaged Affect (typically observed): Appropriate Orientation: : Oriented to Self, Oriented to Place, Oriented to  Time, Oriented to Situation Alcohol / Substance Use: Not Applicable Psych Involvement: No (comment)  Admission diagnosis:  Shortness of breath [R06.02] Acute exacerbation of CHF (congestive heart failure) (HCC) [I50.9] Patient Active Problem List   Diagnosis Date Noted   Aspiration pneumonia (HCC) 03/16/2024   Anemia 03/16/2024   Acute exacerbation of CHF (congestive heart failure) (HCC) 03/15/2024   Atrial fibrillation with RVR (HCC) 01/30/2024  Rib fracture 11/30/2023   Pneumothorax 11/30/2023   CAP (community acquired pneumonia) 10/27/2023   Community acquired pneumonia of left lower lobe of lung 10/26/2023   Hypotension 11/04/2021   Laceration of forehead 11/04/2021   Chronic diastolic CHF (congestive heart failure) (HCC) 11/04/2021   Renal cyst 11/04/2021   Hyperkalemia 11/04/2021   Fall 11/03/2021   Palliative care by specialist     Goals of care, counseling/discussion    UTI (urinary tract infection) 11/10/2019   AF (paroxysmal atrial fibrillation) (HCC) 11/10/2019   Acute blood loss anemia 11/10/2019   Cellulitis 11/10/2019   AKI (acute kidney injury) (HCC) 11/10/2019   History of CVA (cerebrovascular accident) 11/10/2019   Abdominal pain 10/07/2019   Pressure injury of skin 10/07/2019   Atrial fibrillation with rapid ventricular response (HCC) 10/06/2019   Acute encephalopathy    GI bleeding 10/13/2018   Acute metabolic encephalopathy 10/13/2018   Acute renal failure (ARF) (HCC) 10/13/2018   S/P TAVR (transcatheter aortic valve replacement) 09/20/2018   Permanent atrial fibrillation (HCC) 09/20/2018   Acute on chronic diastolic CHF (congestive heart failure) (HCC) 09/18/2018   Heme positive stool 11/20/2017   Gait disorder 03/24/2013   Cervical post-laminectomy syndrome 03/26/2012   Depression 03/26/2012   Degenerative arthritis of shoulder region 03/26/2012   KNEE REPLACEMENT, BILATERAL, HX OF 06/04/2007   CELLULITIS/ABSCESS, ARM 05/05/2007   Anxiety state 04/08/2007   Chronic pain 04/08/2007   OSTEOARTHROSIS, GENERALIZED, MULTIPLE SITES 04/08/2007   SPONDYLOLISTHESIS 04/08/2007   INCONTINENCE, URGE 04/08/2007   HYPERCHOLESTEROLEMIA 03/07/2007   HYPERTENSION, BENIGN 03/07/2007   PCP:  Dois Davenport, MD Pharmacy:   [phone] [place] Otto, [place] - [address] [place] [address] Phone: [phone] Fax: [phone]  [facility] [address] [place] [address] Phone: [phone] Fax: [phone]     Social Drivers of Health (SDOH) Social History: SDOH Screenings   Food Insecurity: No Food Insecurity (01/30/2024)  Housing: Low Risk  (01/30/2024)  Transportation Needs: No Transportation Needs (01/30/2024)  Utilities: Not At Risk (01/30/2024)  Social Connections: Patient Declined (03/16/2024)   Tobacco Use: Medium Risk (03/15/2024)   SDOH Interventions:     Readmission Risk Interventions    03/16/2024   12:59 PM 01/31/2024   11:58 AM 10/27/2023   11:13 AM  Readmission Risk Prevention Plan  Transportation Screening Complete Complete Complete  PCP or Specialist Appt within 5-7 Days Complete Complete Complete  Home Care Screening Complete Complete Complete  Medication Review (RN CM) Complete Complete Complete

## 2024-03-17 ENCOUNTER — Ambulatory Visit: Payer: Medicare Other | Admitting: Primary Care

## 2024-03-17 DIAGNOSIS — I4821 Permanent atrial fibrillation: Secondary | ICD-10-CM

## 2024-03-17 DIAGNOSIS — I5033 Acute on chronic diastolic (congestive) heart failure: Secondary | ICD-10-CM | POA: Diagnosis not present

## 2024-03-17 DIAGNOSIS — D649 Anemia, unspecified: Secondary | ICD-10-CM

## 2024-03-17 LAB — COMPREHENSIVE METABOLIC PANEL WITH GFR
ALT: 20 U/L (ref 0–44)
AST: 22 U/L (ref 15–41)
Albumin: 2.8 g/dL — ABNORMAL LOW (ref 3.5–5.0)
Alkaline Phosphatase: 65 U/L (ref 38–126)
Anion gap: 9 (ref 5–15)
BUN: 13 mg/dL (ref 8–23)
CO2: 30 mmol/L (ref 22–32)
Calcium: 9.1 mg/dL (ref 8.9–10.3)
Chloride: 98 mmol/L (ref 98–111)
Creatinine, Ser: 1.04 mg/dL (ref 0.61–1.24)
GFR, Estimated: >60 mL/min (ref >60)
Glucose, Bld: 92 mg/dL (ref 70–99)
Potassium: 3.6 mmol/L (ref 3.5–5.1)
Sodium: 137 mmol/L (ref 135–145)
Total Bilirubin: 0.7 mg/dL (ref 0.0–1.2)
Total Protein: 6.2 g/dL — ABNORMAL LOW (ref 6.5–8.1)

## 2024-03-17 LAB — MAGNESIUM: Magnesium: 1.9 mg/dL (ref 1.7–2.4)

## 2024-03-17 LAB — C-REACTIVE PROTEIN: CRP: 7.9 mg/dL — ABNORMAL HIGH (ref <1.0)

## 2024-03-17 LAB — CBC
HCT: 35.6 % — ABNORMAL LOW (ref 39.0–52.0)
Hemoglobin: 12 g/dL — ABNORMAL LOW (ref 13.0–17.0)
MCH: 32.8 pg (ref 26.0–34.0)
MCHC: 33.7 g/dL (ref 30.0–36.0)
MCV: 97.3 fL (ref 80.0–100.0)
Platelets: 244 10*3/uL (ref 150–400)
RBC: 3.66 MIL/uL — ABNORMAL LOW (ref 4.22–5.81)
RDW: 14.7 % (ref 11.5–15.5)
WBC: 6.4 10*3/uL (ref 4.0–10.5)
nRBC: 0 % (ref 0.0–0.2)

## 2024-03-17 LAB — VITAMIN B12: Vitamin B-12: 260 pg/mL (ref 180–914)

## 2024-03-17 MED ORDER — OXYCODONE-ACETAMINOPHEN 5-325 MG PO TABS
2.0000 | ORAL_TABLET | Freq: Four times a day (QID) | ORAL | Status: DC | PRN
  Administered 2024-03-17 – 2024-03-19 (×5): 2 via ORAL
  Filled 2024-03-17 (×5): qty 2

## 2024-03-17 MED ORDER — TOPIRAMATE 25 MG PO TABS
25.0000 mg | ORAL_TABLET | Freq: Every day | ORAL | Status: DC
  Administered 2024-03-17 – 2024-03-18 (×2): 25 mg via ORAL
  Filled 2024-03-17 (×2): qty 1

## 2024-03-17 MED ORDER — ROPINIROLE HCL 0.25 MG PO TABS
0.2500 mg | ORAL_TABLET | Freq: Two times a day (BID) | ORAL | Status: DC
  Administered 2024-03-17 – 2024-03-19 (×5): 0.25 mg via ORAL
  Filled 2024-03-17 (×5): qty 1

## 2024-03-17 MED ORDER — FUROSEMIDE 10 MG/ML IJ SOLN
40.0000 mg | Freq: Two times a day (BID) | INTRAMUSCULAR | Status: AC
  Administered 2024-03-17: 40 mg via INTRAVENOUS
  Filled 2024-03-17: qty 4

## 2024-03-17 MED ORDER — EMPAGLIFLOZIN 10 MG PO TABS
10.0000 mg | ORAL_TABLET | Freq: Every day | ORAL | Status: DC
  Administered 2024-03-18 – 2024-03-19 (×2): 10 mg via ORAL
  Filled 2024-03-17 (×2): qty 1

## 2024-03-17 MED ORDER — SPIRONOLACTONE 25 MG PO TABS
25.0000 mg | ORAL_TABLET | Freq: Every day | ORAL | Status: DC
Start: 2024-03-18 — End: 2024-03-18

## 2024-03-17 MED ORDER — GABAPENTIN 300 MG PO CAPS
300.0000 mg | ORAL_CAPSULE | Freq: Three times a day (TID) | ORAL | Status: DC
  Administered 2024-03-17 – 2024-03-19 (×7): 300 mg via ORAL
  Filled 2024-03-17 (×7): qty 1

## 2024-03-17 MED ORDER — BUPROPION HCL 100 MG PO TABS
100.0000 mg | ORAL_TABLET | Freq: Two times a day (BID) | ORAL | Status: DC
  Administered 2024-03-17 – 2024-03-19 (×5): 100 mg via ORAL
  Filled 2024-03-17 (×5): qty 1

## 2024-03-17 MED ORDER — HYDROMORPHONE HCL 1 MG/ML IJ SOLN
0.5000 mg | INTRAMUSCULAR | Status: DC | PRN
  Administered 2024-03-17 – 2024-03-19 (×9): 0.5 mg via INTRAVENOUS
  Filled 2024-03-17 (×9): qty 0.5

## 2024-03-17 NOTE — Progress Notes (Addendum)
 PROGRESS NOTE    [name]  ZOX:[id number] DOB: 10-Jun-1941 DOA: 03/15/2024 PCP: Dois Davenport, MD  82/M with chronic diastolic CHF, CAD, TAVR, chronic A-fib on Eliquis, CVA, chronic back pain, multiple back surgeries, narcotic dependence, depression, recently hospitalized 2/12-14 for A-fib RVR, started on diltiazem and then.  Back in the 3/31 with shortness of breath edema and left-sided chest/rib pain, leg pain following a fall.  In the ED he was noted to be volume overloaded, hemoglobin 10.8, BNP 578, troponin negative, CT chest noted fracture of lateral seventh rib, small pleural effusions and patchy airspace opacity in lower lobe with more dense consolidation in the right middle lobe.  Admitted overnight, started on IV Lasix and antibiotics   Subjective: Upset about not getting adequate pain meds last night  Assessment and Plan:  Acute on chronic HFpEF -Recent echo 2/25 noted EF of 65-70%, normal RV -Suspected exacerbation related to recent A-fib with RVR, could benefit from restoration of sinus rhythm -Continue IV Lasix today, brisk diuresis yesterday, back off on diuretics tomorrow, slowly add GDMT -Will request cardiology input   Possible aspiration pneumonia - CT showing scattered patchy groundglass opacities in the lower lobes with more dense consolidation in the right middle lobe suspicious for infection or aspiration.  -Continue IV Unasyn today, transition to oral antibiotics tomorrow -Check SLP eval  Paroxysmal atrial fibrillation -Rate controlled, continue diltiazem and Eliquis -May benefit from restoration of sinus rhythm, cards consulted   Acute nondisplaced fracture of the left lateral seventh rib Secondary to a mechanical fall.  -Supportive care, no pneumothorax on imaging  Normocytic anemia -Hemoglobin slightly lower than baseline, could be dilutional, improving with diuresis   Hypertension Stable.  Continue diltiazem and metoprolol.   CAD Workup not  suggestive of ACS.  Continue aspirin, metoprolol, and simvastatin.  Chronic pain Narcotic dependence -Restarted home regimen of Percocet and gabapentin   DVT prophylaxis: Eliquis Code Status: Full Code (discussed with the patient) Family Communication: No family available at this time. Level of care: Telemetry bed  Consultants:    Procedures:   Antimicrobials:    Objective: Vitals:   03/16/24 2009 03/17/24 0045 03/17/24 0452 03/17/24 0730  BP: (!) 107/49 95/65 124/80 135/85  Pulse: 82 84 92 96  Resp: 18 18 20 16   Temp: 98 F (36.7 C) 97.7 F (36.5 C) (!) 97.5 F (36.4 C) 97.6 F (36.4 C)  TempSrc: Oral Oral Oral Oral  SpO2: 97% 98% 98% 96%  Weight:   101.6 kg     Intake/Output Summary (Last 24 hours) at 03/17/2024 1007 Last data filed at 03/17/2024 0801 Gross per 24 hour  Intake 300 ml  Output 8300 ml  Net -8000 ml   Filed Weights   03/16/24 0045 03/16/24 0440 03/17/24 0452  Weight: 109 kg 109 kg 101.6 kg    Examination:  General exam: Chronically ill laying in bed, AAO x 2, mild cognitive deficits Respiratory system: Few scattered rhonchi, poor air movement Cardiovascular system: S1 & S2 heard, irregular rhythm Abd: nondistended, soft and nontender.Normal bowel sounds heard. Extremities: 1+ edema Skin: No rashes Psychiatry:  Mood & affect appropriate.     Data Reviewed:   CBC: Recent Labs  Lab 03/15/24 1636 03/16/24 0903 03/17/24 0354  WBC 6.3 5.9 6.4  HGB 10.8* 11.4* 12.0*  HCT 33.9* 34.5* 35.6*  MCV 100.0 98.0 97.3  PLT 194 219 244   Basic Metabolic Panel: Recent Labs  Lab 03/15/24 1649 03/16/24 0903 03/17/24 0354  NA 135 137  137  K 4.2 4.0 3.6  CL 101 104 98  CO2 28 26 30   GLUCOSE 101* 106* 92  BUN 22 15 13   CREATININE 1.15 1.11 1.04  CALCIUM 8.8* 9.1 9.1  MG  --   --  1.9   GFR: Estimated Creatinine Clearance: 65.5 mL/min (by C-G formula based on SCr of 1.04 mg/dL). Liver Function Tests: Recent Labs  Lab 03/15/24 1649  03/17/24 0354  AST 26 22  ALT 19 20  ALKPHOS 73 65  BILITOT 0.5 0.7  PROT 6.9 6.2*  ALBUMIN 3.5 2.8*   No results for input(s): "LIPASE", "AMYLASE" in the last 168 hours. No results for input(s): "AMMONIA" in the last 168 hours. Coagulation Profile: Recent Labs  Lab 03/15/24 1636  INR 1.0   Cardiac Enzymes: No results for input(s): "CKTOTAL", "CKMB", "CKMBINDEX", "TROPONINI" in the last 168 hours. BNP (last 3 results) No results for input(s): "PROBNP" in the last 8760 hours. HbA1C: No results for input(s): "HGBA1C" in the last 72 hours. CBG: No results for input(s): "GLUCAP" in the last 168 hours. Lipid Profile: No results for input(s): "CHOL", "HDL", "LDLCALC", "TRIG", "CHOLHDL", "LDLDIRECT" in the last 72 hours. Thyroid Function Tests: No results for input(s): "TSH", "T4TOTAL", "FREET4", "T3FREE", "THYROIDAB" in the last 72 hours. Anemia Panel: Recent Labs    03/16/24 0903  VITAMINB12 326  FOLATE 29.0  FERRITIN 263  TIBC 337  IRON 40*  RETICCTPCT 1.5   Urine analysis:    Component Value Date/Time   COLORURINE YELLOW 01/29/2024 2122   APPEARANCEUR CLEAR 01/29/2024 2122   LABSPEC 1.024 01/29/2024 2122   PHURINE 7.0 01/29/2024 2122   GLUCOSEU NEGATIVE 01/29/2024 2122   HGBUR NEGATIVE 01/29/2024 2122   HGBUR negative 04/08/2007 1327   BILIRUBINUR NEGATIVE 01/29/2024 2122   KETONESUR 5 (A) 01/29/2024 2122   PROTEINUR NEGATIVE 01/29/2024 2122   UROBILINOGEN 0.2 04/24/2010 1617   NITRITE NEGATIVE 01/29/2024 2122   LEUKOCYTESUR NEGATIVE 01/29/2024 2122   Sepsis Labs: @LABRCNTIP (procalcitonin:4,lacticidven:4)  ) Recent Results (from the past 240 hours)  SARS Coronavirus 2 by RT PCR (hospital order, performed in [facility] hospital lab) *cepheid single result test* Anterior Nasal Swab     Status: None   Collection Time: 03/16/24  7:01 AM   Specimen: Anterior Nasal Swab  Result Value Ref Range Status   SARS Coronavirus 2 by RT PCR NEGATIVE NEGATIVE Final     Comment: Performed at [facility], [address] [place], [place] [address]  Respiratory (~20 pathogens) panel by PCR     Status: None   Collection Time: 03/16/24  7:01 AM   Specimen: Nasopharyngeal Swab; Respiratory  Result Value Ref Range Status   Adenovirus NOT DETECTED NOT DETECTED Final   Coronavirus 229E NOT DETECTED NOT DETECTED Final    Comment: (NOTE) The Coronavirus on the Respiratory Panel, DOES NOT test for the novel  Coronavirus (2019 nCoV)    Coronavirus HKU1 NOT DETECTED NOT DETECTED Final   Coronavirus NL63 NOT DETECTED NOT DETECTED Final   Coronavirus OC43 NOT DETECTED NOT DETECTED Final   Metapneumovirus NOT DETECTED NOT DETECTED Final   Rhinovirus / Enterovirus NOT DETECTED NOT DETECTED Final   Influenza A NOT DETECTED NOT DETECTED Final   Influenza B NOT DETECTED NOT DETECTED Final   Parainfluenza Virus 1 NOT DETECTED NOT DETECTED Final   Parainfluenza Virus 2 NOT DETECTED NOT DETECTED Final   Parainfluenza Virus 3 NOT DETECTED NOT DETECTED Final   Parainfluenza Virus 4 NOT DETECTED NOT DETECTED  Final   Respiratory Syncytial Virus NOT DETECTED NOT DETECTED Final   Bordetella pertussis NOT DETECTED NOT DETECTED Final   Bordetella Parapertussis NOT DETECTED NOT DETECTED Final   Chlamydophila pneumoniae NOT DETECTED NOT DETECTED Final   Mycoplasma pneumoniae NOT DETECTED NOT DETECTED Final    Comment: Performed at [facility], [address] [place], [place] [address]     Radiology Studies: CT CHEST ABDOMEN PELVIS W CONTRAST Result Date: 03/15/2024 CLINICAL DATA:  Shortness of breath, left-sided rib pain since a couple days ago. Bilateral lower extremity swelling. EXAM: CT CHEST, ABDOMEN, AND PELVIS WITH CONTRAST CT lumbar spine with contrast TECHNIQUE: Multidetector CT imaging of the chest, abdomen and pelvis was performed following the standard protocol during bolus administration of intravenous contrast. Multiplanar CT images of the lumbar spine  were reconstructed from contemporary CT of the Chest, Abdomen, and Pelvis. RADIATION DOSE REDUCTION: This exam was performed according to the departmental dose-optimization program which includes automated exposure control, adjustment of the mA and/or kV according to patient size and/or use of iterative reconstruction technique. CONTRAST:  OMNIPAQUE IOHEXOL 300 MG/ML  SOLN COMPARISON:  CT 01/29/2024 FINDINGS: CT CHEST FINDINGS Cardiovascular: No pericardial effusion. TAVR. Coronary artery and aortic atherosclerotic calcification Mediastinum/Nodes: Trachea and esophagus are unremarkable. No thoracic adenopathy. Lungs/Pleura: Small right and trace left pleural effusions. Bronchial wall thickening greatest in the lower lungs. There are scattered patchy ground-glass opacities in the lower lobes. More dense consolidation in the right middle lobe. No pneumothorax. Musculoskeletal: Acute nondisplaced fracture of the left lateral seventh rib. Similar subacute-chronic fractures of the left posterolateral eighth and ninth ribs. Posterior fusion T10-T11. CT ABDOMEN PELVIS FINDINGS Hepatobiliary: No acute abnormality. Pancreas: Fatty atrophy.  No acute abnormality. Spleen: Unremarkable. Adrenals/Urinary Tract: Normal adrenal glands. Nonobstructing punctate right nephrolithiasis. No obstructing ureteral calculi or hydronephrosis. Posterior and left-sided bladder wall diverticula. Stomach/Bowel: Normal caliber large and small bowel. No bowel wall thickening. Stomach and appendix are within normal limits. Vascular/Lymphatic: Aortic atherosclerosis. No enlarged abdominal or pelvic lymph nodes. Reproductive: Unremarkable. Other: No free intraperitoneal fluid or air. Fat containing ventral abdominal wall hernias. Musculoskeletal: No acute fracture CT LUMBAR SPINE FINDINGS Segmentation: 5 lumbar type vertebrae. Alignment: No evidence of traumatic listhesis. Vertebrae: No acute fracture. Paraspinal and other soft tissues: See  above. Disc levels: Multilevel spondylosis with bulky anterior osteophytes, disc space height loss with vacuum phenomenon, and degenerative endplate changes. Degenerative endplate changes are greatest at L2-L3. Advanced facet arthropathy at L4-L5 and L5-S1. No severe spinal canal narrowing. IMPRESSION: 1. Acute nondisplaced fracture of the left lateral seventh rib. No pneumothorax. 2. Small right and trace left pleural effusions. 3. Scattered patchy ground-glass opacities in the lower lobes with more dense consolidation in the right middle lobe. Findings are favored to represent infection or aspiration. 4. No acute traumatic injury in the abdomen or pelvis. 5. No acute fracture or traumatic listhesis in the lumbar spine. 6. Aortic Atherosclerosis (ICD10-I70.0). Electronically Signed   By: Minerva Fester M.D.   On: 03/15/2024 19:11   CT L-SPINE NO CHARGE Result Date: 03/15/2024 CLINICAL DATA:  Shortness of breath, left-sided rib pain since a couple days ago. Bilateral lower extremity swelling. EXAM: CT CHEST, ABDOMEN, AND PELVIS WITH CONTRAST CT lumbar spine with contrast TECHNIQUE: Multidetector CT imaging of the chest, abdomen and pelvis was performed following the standard protocol during bolus administration of intravenous contrast. Multiplanar CT images of the lumbar spine were reconstructed from contemporary CT of the Chest, Abdomen, and Pelvis. RADIATION DOSE  REDUCTION: This exam was performed according to the departmental dose-optimization program which includes automated exposure control, adjustment of the mA and/or kV according to patient size and/or use of iterative reconstruction technique. CONTRAST:  OMNIPAQUE IOHEXOL 300 MG/ML  SOLN COMPARISON:  CT 01/29/2024 FINDINGS: CT CHEST FINDINGS Cardiovascular: No pericardial effusion. TAVR. Coronary artery and aortic atherosclerotic calcification Mediastinum/Nodes: Trachea and esophagus are unremarkable. No thoracic adenopathy. Lungs/Pleura: Small  right and trace left pleural effusions. Bronchial wall thickening greatest in the lower lungs. There are scattered patchy ground-glass opacities in the lower lobes. More dense consolidation in the right middle lobe. No pneumothorax. Musculoskeletal: Acute nondisplaced fracture of the left lateral seventh rib. Similar subacute-chronic fractures of the left posterolateral eighth and ninth ribs. Posterior fusion T10-T11. CT ABDOMEN PELVIS FINDINGS Hepatobiliary: No acute abnormality. Pancreas: Fatty atrophy.  No acute abnormality. Spleen: Unremarkable. Adrenals/Urinary Tract: Normal adrenal glands. Nonobstructing punctate right nephrolithiasis. No obstructing ureteral calculi or hydronephrosis. Posterior and left-sided bladder wall diverticula. Stomach/Bowel: Normal caliber large and small bowel. No bowel wall thickening. Stomach and appendix are within normal limits. Vascular/Lymphatic: Aortic atherosclerosis. No enlarged abdominal or pelvic lymph nodes. Reproductive: Unremarkable. Other: No free intraperitoneal fluid or air. Fat containing ventral abdominal wall hernias. Musculoskeletal: No acute fracture CT LUMBAR SPINE FINDINGS Segmentation: 5 lumbar type vertebrae. Alignment: No evidence of traumatic listhesis. Vertebrae: No acute fracture. Paraspinal and other soft tissues: See above. Disc levels: Multilevel spondylosis with bulky anterior osteophytes, disc space height loss with vacuum phenomenon, and degenerative endplate changes. Degenerative endplate changes are greatest at L2-L3. Advanced facet arthropathy at L4-L5 and L5-S1. No severe spinal canal narrowing. IMPRESSION: 1. Acute nondisplaced fracture of the left lateral seventh rib. No pneumothorax. 2. Small right and trace left pleural effusions. 3. Scattered patchy ground-glass opacities in the lower lobes with more dense consolidation in the right middle lobe. Findings are favored to represent infection or aspiration. 4. No acute traumatic injury in the  abdomen or pelvis. 5. No acute fracture or traumatic listhesis in the lumbar spine. 6. Aortic Atherosclerosis (ICD10-I70.0). Electronically Signed   By: Minerva Fester M.D.   On: 03/15/2024 19:11   CT Cervical Spine Wo Contrast Result Date: 03/15/2024 CLINICAL DATA:  Neck trauma (Age >= 65y) EXAM: CT CERVICAL SPINE WITHOUT CONTRAST TECHNIQUE: Multidetector CT imaging of the cervical spine was performed without intravenous contrast. Multiplanar CT image reconstructions were also generated. RADIATION DOSE REDUCTION: This exam was performed according to the departmental dose-optimization program which includes automated exposure control, adjustment of the mA and/or kV according to patient size and/or use of iterative reconstruction technique. COMPARISON:  None Available. FINDINGS: Alignment: 9 mm of degenerative anterolisthesis of C6 on C7. 4 mm of degenerative anterolisthesis of C7 on T1. Findings stable since prior study. Skull base and vertebrae: No acute fracture. No primary bone lesion or focal pathologic process. Soft tissues and spinal canal: No prevertebral fluid or swelling. No visible canal hematoma. Disc levels: ACDF at C6-7. Posterior fusion with posterior spinal rods extending from the occipital region into the upper thoracic spine. Fusion across the facet joints diffusely. Advanced diffuse degenerative disc disease. Upper chest: Bilateral pleural effusions partially visualized. Other: None IMPRESSION: Diffuse postsurgical and degenerative changes throughout the cervical spine. No acute bony abnormality. Bilateral pleural effusions partially visualized Electronically Signed   By: Charlett Nose M.D.   On: 03/15/2024 19:02   CT Head Wo Contrast Result Date: 03/15/2024 CLINICAL DATA:  Head trauma, moderate-severe EXAM: CT HEAD WITHOUT CONTRAST TECHNIQUE: Contiguous axial images  were obtained from the base of the skull through the vertex without intravenous contrast. RADIATION DOSE REDUCTION: This exam  was performed according to the departmental dose-optimization program which includes automated exposure control, adjustment of the mA and/or kV according to patient size and/or use of iterative reconstruction technique. COMPARISON:  None Available. FINDINGS: Brain: There is atrophy and chronic small vessel disease changes. No acute intracranial abnormality. Specifically, no hemorrhage, hydrocephalus, mass lesion, acute infarction, or significant intracranial injury. Vascular: No hyperdense vessel or unexpected calcification. Skull: No acute calvarial abnormality. Sinuses/Orbits: No acute findings Other: None IMPRESSION: Atrophy, chronic microvascular disease. No acute intracranial abnormality. Electronically Signed   By: Charlett Nose M.D.   On: 03/15/2024 18:53     Scheduled Meds:  apixaban  5 mg Oral BID   atorvastatin  20 mg Oral QHS   buPROPion  100 mg Oral BID   diltiazem  120 mg Oral Daily   furosemide  40 mg Intravenous BID   gabapentin  300 mg Oral TID   metoprolol succinate  25 mg Oral Daily   rOPINIRole  0.25 mg Oral BID   topiramate  25 mg Oral QHS   Continuous Infusions:  ampicillin-sulbactam (UNASYN) IV 3 g (03/17/24 0610)     LOS: 1 day    Time spent:    Zannie Cove, MD [facility]   03/17/2024, 10:07 AM

## 2024-03-17 NOTE — Plan of Care (Signed)
  Problem: Health Behavior/Discharge Planning: Goal: Ability to manage health-related needs will improve Outcome: Progressing   Problem: Clinical Measurements: Goal: Ability to maintain clinical measurements within normal limits will improve Outcome: Progressing   Problem: Elimination: Goal: Will not experience complications related to bowel motility Outcome: Progressing   Problem: Pain Managment: Goal: General experience of comfort will improve and/or be controlled Outcome: Progressing

## 2024-03-17 NOTE — Consult Note (Addendum)
 Cardiology Consultation   Patient ID: [name] MRN: [id number]; DOB: 1941-08-22  Admit date: 03/15/2024 Date of Consult: 03/17/2024  PCP:  Dois Davenport, MD   [facility] Providers Cardiologist:  [facility]  Patient Profile:   [name] is a 83 y.o. male with a hx of permanent atrial fibrillation, severe AS status post TAVR 05/2018, PFO, hypertension, hyperlipidemia, CVA who is being seen 03/17/2024 for the evaluation of CHF at the request of Dr. Jomarie Longs.  History of Present Illness:   [name] has known atrial fibrillation since at least 2020 when we had seen him inpatient for CHF and A-fib RVR in the setting of a fall.  However he routinely follows with the [place].  Previous cardiac catheterization April 2019 showing patent coronary anatomy based off of care everywhere.  Chronically for his A-fib he has been on Eliquis and diltiazem.  November 2020 was the last time we had seen him, we had planned to see him outpatient but never seen in follow-up.  Since that time he has been seen in December 2024 for fall.  February 2025 for A-fib RVR. We were not involved in his care during that admission.   He was resumed on his home diltiazem.    Now currently patient being evaluated after sustaining a fall after tripping on a flowerpot.  Although previous encounter notes say differently but this is what he reported to me.  He sustained a nondisplaced fracture of his left lateral seventh rib.  Also with questionable aspiration pneumonia treated with antibiotics currently.  Felt to be volume up with chronic HFpEF so cardiology was asked to further manage manage,  has had very brisk diuresis and down 8 L of last 24 hours with essentially full recovery of symptoms.  [facility] and [facility] 10-month once.  Compliant with all of his medications. Reportedly was not taking his Lasix after recent admission because he was reportedly told to do so. The  last 2 weeks he has been noted seem to increase peripheral edema however no significant orthopnea, shortness of breath, chest pain, palpitations.  He is unaware of his atrial fibrillation.  He is active and reports that he rides a bicycle 15 to 20 minutes daily but ambulates with a cane.  He is not married and with no kids. Lives at home with his sister after her husband died.  Reports being overall well functional without any significant limitations.   Overall no significant trauma findings based off imaging other than acute fracture as above.  Small right and trace left pleural effusion.  Scattered groundglass opacity these in the lower lobes.  Respiratory panel negative.  Troponins negative x 2.  BNP in the 500s.  Potassium 3.6.  Creatinine 1.04.  Albumin 2.8.  Hemoglobin 12.   Past Medical History:  Diagnosis Date   Cancer [facility])    Carpal tunnel syndrome    Cervical spondylosis with myelopathy    Chronic pain syndrome    Depression    Difficult intubation    needs smaller tube; awake oral fiberoptic scope 8.0 ETT 03/30/08   Dysphagia, pharyngeal phase    Hemorrhage of gastrointestinal tract, unspecified    History of cardiac cath    a. 03/2018 Cath: LAD and LCX with mild luminal irregularities.  No significant disease.   Iron deficiency anemia, unspecified    Lumbosacral spondylosis without myelopathy    Muscle weakness (generalized)  Other and unspecified disc disorder of cervical region    Other and unspecified hyperlipidemia    Patent foramen ovale    a. 05/2018 Echo: post-op TAVR--> + bublble study w/ L->R shunt.   Permanent atrial fibrillation (HCC)    a.  Diagnosed in the spring 2019.  Had rapid atrial fibrillation following TAVR and has been rate controlled with beta-blocker.  CHA2DS2VASc equals 7.  Supposed to be on Eliquis.   Primary localized osteoarthrosis, lower leg    Primary localized osteoarthrosis, shoulder region    Reflux esophagitis    Severe aortic stenosis    a.  mild-mod AS by 01/2016 TTE, restricted AV opening with mod AR 01/2016 TEE; b. 10/2017 Echo: EF 60-65%, sev Ca2+ AoV, mean grad ; c. 05/2018 s/p TAVR ([facility]); d. 06/2018 Echo: EF 55-60%, well-seated prosth AoV, peak velocity 156cm/s. AoV gradient .   Spinal stenosis, unspecified region other than cervical    Stroke (HCC)    01/2016   Syncope and collapse    Unspecified arthropathy, lower leg    Unspecified constipation    Unspecified essential hypertension    Unspecified glaucoma(365.9)     Past Surgical History:  Procedure Laterality Date   BILATERAL CATARACT SURGERY  2009   DR GROAT    BIOPSY  10/14/2018   Procedure: BIOPSY;  Surgeon: Graylin Shiver, MD;  Location: [facility] ENDOSCOPY;  Service: Endoscopy;;   CARDIAC VALVE REPLACEMENT  05/2018   CERVIACAL SPINE (4X)     DR MARK ROY    COLONOSCOPY  2007   DR HENSEL    COLONOSCOPY WITH PROPOFOL N/A 11/20/2017   Procedure: COLONOSCOPY WITH PROPOFOL;  Surgeon: Charlott Rakes, MD;  Location: [facility] ENDOSCOPY;  Service: Endoscopy;  Laterality: N/A;   ESOPHAGOGASTRODUODENOSCOPY (EGD) WITH PROPOFOL N/A 10/14/2018   Procedure: ESOPHAGOGASTRODUODENOSCOPY (EGD) WITH PROPOFOL;  Surgeon: Graylin Shiver, MD;  Location: [facility] ENDOSCOPY;  Service: Endoscopy;  Laterality: N/A;   JOINT REPLACEMENT     both knees   LEFT KNEE REPLACEMENT     DR ALUSIO   LEFT TRANSVERSE CARPAL LIGAMENT  01/06/2008   ROTATOR CUFF LEFT SHOULDER  2001   DR MURPHY    SHOULDER OPEN ROTATOR CUFF REPAIR  2006   DR [facility]   SPINE SURGERY     neck fusion    Inpatient Medications: Scheduled Meds:  apixaban  5 mg Oral BID   atorvastatin  20 mg Oral QHS   buPROPion  100 mg Oral BID   diltiazem  120 mg Oral Daily   furosemide  40 mg Intravenous BID   gabapentin  300 mg Oral TID   metoprolol succinate  25 mg Oral Daily   rOPINIRole  0.25 mg Oral BID   topiramate  25 mg Oral QHS   Continuous Infusions:  ampicillin-sulbactam (UNASYN) IV 3 g (03/17/24 1121)   PRN  Meds: HYDROmorphone (DILAUDID) injection, naLOXone (NARCAN)  injection, oxyCODONE-acetaminophen  Allergies:    Allergies  Allergen Reactions   Ativan [Lorazepam] Other (See Comments)    Delirium   Lyrica [Pregabalin] Swelling and Other (See Comments)    Triggered PTSD Nightmares Aggression   Zanaflex [Tizanidine Hcl] Swelling and Anxiety   Cymbalta [Duloxetine Hcl] Other (See Comments)    Increased depression Irritability     Social History:   Social History   Socioeconomic History   Marital status: Single    Spouse name: Not on file   Number of children: 0   Years of education: GED   Highest education level:  Not on file  Occupational History   Occupation: retired    Comment: 10 years in service, [address] [employer]  Tobacco Use   Smoking status: Former    Current packs/day: 0.00    Types: Cigarettes    Quit date: 08/29/1985    Years since quitting: 38.5   Smokeless tobacco: Former    Quit date: 02/12/1985  Vaping Use   Vaping status: Never Used  Substance and Sexual Activity   Alcohol use: No   Drug use: No   Sexual activity: Not Currently  Other Topics Concern   Not on file  Social History Narrative   Lives alone   caffeine use - none   Social Drivers of Corporate investment banker Strain: Not on file  Food Insecurity: No Food Insecurity (01/30/2024)   Hunger Vital Sign    Worried About Running Out of Food in the Last Year: Never true    Ran Out of Food in the Last Year: Never true  Transportation Needs: No Transportation Needs (01/30/2024)   PRAPARE - Administrator, Civil Service (Medical): No    Lack of Transportation (Non-Medical): No  Physical Activity: Not on file  Stress: Not on file  Social Connections: Patient Declined (03/16/2024)   Social Connection and Isolation Panel [NHANES]    Frequency of Communication with Friends and Family: Patient declined    Frequency of Social Gatherings with Friends and Family: Patient declined    Attends  Religious Services: Patient declined    Database administrator or Organizations: Patient declined    Attends Banker Meetings: Patient declined    Marital Status: Patient declined  Intimate Partner Violence: Not At Risk (01/30/2024)   Humiliation, Afraid, Rape, and Kick questionnaire    Fear of Current or Ex-Partner: No    Emotionally Abused: No    Physically Abused: No    Sexually Abused: No    Family History:   Family History  Problem Relation Age of Onset   Cancer Mother 30       cervical cancer   Cancer Father 58       lung  cancer     ROS:  Please see the history of present illness.  All other ROS reviewed and negative.     Physical Exam/Data:   Vitals:   03/17/24 0045 03/17/24 0452 03/17/24 0730 03/17/24 1116  BP: 95/65 124/80 135/85 121/81  Pulse: 84 92 96 88  Resp: 18 20 16 16   Temp: 97.7 F (36.5 C) (!) 97.5 F (36.4 C) 97.6 F (36.4 C) (!) 97.5 F (36.4 C)  TempSrc: Oral Oral Oral Oral  SpO2: 98% 98% 96% 97%  Weight:  101.6 kg      Intake/Output Summary (Last 24 hours) at 03/17/2024 1249 Last data filed at 03/17/2024 1116 Gross per 24 hour  Intake 300 ml  Output 9500 ml  Net -9200 ml      03/17/2024    4:52 AM 03/16/2024    4:40 AM 03/16/2024   12:45 AM  Last 3 Weights  Weight (lbs) 223 lb 15.8 oz 240 lb 4.8 oz 240 lb 4.8 oz  Weight (kg) 101.6 kg 109 kg 109 kg     Body mass index is 33.08 kg/m.  General:  Well nourished, well developed, in no acute distress HEENT: normal Neck:   Very difficult to assess JVD.  He has a very short neck Vascular: No carotid bruits; Distal pulses 2+ bilaterally Cardiac:   irregularly irregular.  Lungs:  clear to auscultation bilaterally, no wheezing, rhonchi or rales  Abd: soft, nontender, no hepatomegaly  Ext: no edema Musculoskeletal:  No deformities, BUE and BLE strength normal and equal Skin: warm and dry  Neuro:  CNs 2-12 intact, no focal abnormalities noted Psych:  Normal affect   EKG:  The EKG  was personally reviewed and demonstrates:    Atrial fibrillation heart rate 93.  No acute ST-T wave changes. Telemetry:  Telemetry was personally reviewed and demonstrates:    Atrial fibrillation heart rates generally in the 70s.  Relevant CV Studies:   Echocardiogram 01/30/2024  1. Left ventricular ejection fraction, by estimation, is 65 to 70%. The  left ventricle has normal function. The left ventricle has no regional  wall motion abnormalities. There is mild concentric left ventricular  hypertrophy. Left ventricular diastolic  function could not be evaluated.   2. Right ventricular systolic function is normal. The right ventricular  size is normal. There is normal pulmonary artery systolic pressure.   3. Left atrial size was severely dilated.   4. Right atrial size was severely dilated.   5. The mitral valve is normal in structure. No evidence of mitral valve  regurgitation. No evidence of mitral stenosis.   6. The aortic valve has been repaired/replaced. Aortic valve  regurgitation is not visualized. No aortic stenosis is present. There is a  34 CoreValve-EvolutR prosthetic (TAVR) valve present in the aortic  position. Procedure Date: 05/2018. Echo findings  are consistent with normal structure and function of the aortic valve  prosthesis. Aortic valve area, by VTI measures 1.69 cm. Aortic valve mean  gradient measures 4.6 mmHg. Aortic valve Vmax measures 1.46 m/s.   7. The inferior vena cava is normal in size with greater than 50%  respiratory variability, suggesting right atrial pressure of 3 mmHg.   Cardiac cath 03/2018 (care everywhere)  patent coronary arteries with very mild, luminal irregularities in the LAD and left Cx Laboratory Data:  High Sensitivity Troponin:   Recent Labs  Lab 03/15/24 1636 03/15/24 1938  TROPONINIHS 6 6     Chemistry Recent Labs  Lab 03/15/24 1649 03/16/24 0903 03/17/24 0354  NA 135 137 137  K 4.2 4.0 3.6  CL 101 104 98  CO2 28 26 30    GLUCOSE 101* 106* 92  BUN 22 15 13   CREATININE 1.15 1.11 1.04  CALCIUM 8.8* 9.1 9.1  MG  --   --  1.9  GFRNONAA >60 >60 >60  ANIONGAP 6 7 9     Recent Labs  Lab 03/15/24 1649 03/17/24 0354  PROT 6.9 6.2*  ALBUMIN 3.5 2.8*  AST 26 22  ALT 19 20  ALKPHOS 73 65  BILITOT 0.5 0.7   Lipids No results for input(s): "CHOL", "TRIG", "HDL", "LABVLDL", "LDLCALC", "CHOLHDL" in the last 168 hours.  Hematology Recent Labs  Lab 03/15/24 1636 03/16/24 0903 03/17/24 0354  WBC 6.3 5.9 6.4  RBC 3.39* 3.52*  3.53* 3.66*  HGB 10.8* 11.4* 12.0*  HCT 33.9* 34.5* 35.6*  MCV 100.0 98.0 97.3  MCH 31.9 32.4 32.8  MCHC 31.9 33.0 33.7  RDW 15.0 15.0 14.7  PLT 194 219 244   Thyroid No results for input(s): "TSH", "FREET4" in the last 168 hours.  BNP Recent Labs  Lab 03/15/24 1636  BNP 578.5*    DDimer No results for input(s): "DDIMER" in the last 168 hours.   Radiology/Studies:  CT CHEST ABDOMEN PELVIS W CONTRAST Result Date: 03/15/2024 CLINICAL DATA:  Shortness of  breath, left-sided rib pain since a couple days ago. Bilateral lower extremity swelling. EXAM: CT CHEST, ABDOMEN, AND PELVIS WITH CONTRAST CT lumbar spine with contrast TECHNIQUE: Multidetector CT imaging of the chest, abdomen and pelvis was performed following the standard protocol during bolus administration of intravenous contrast. Multiplanar CT images of the lumbar spine were reconstructed from contemporary CT of the Chest, Abdomen, and Pelvis. RADIATION DOSE REDUCTION: This exam was performed according to the departmental dose-optimization program which includes automated exposure control, adjustment of the mA and/or kV according to patient size and/or use of iterative reconstruction technique. CONTRAST:  OMNIPAQUE IOHEXOL 300 MG/ML  SOLN COMPARISON:  CT 01/29/2024 FINDINGS: CT CHEST FINDINGS Cardiovascular: No pericardial effusion. TAVR. Coronary artery and aortic atherosclerotic calcification Mediastinum/Nodes: Trachea  and esophagus are unremarkable. No thoracic adenopathy. Lungs/Pleura: Small right and trace left pleural effusions. Bronchial wall thickening greatest in the lower lungs. There are scattered patchy ground-glass opacities in the lower lobes. More dense consolidation in the right middle lobe. No pneumothorax. Musculoskeletal: Acute nondisplaced fracture of the left lateral seventh rib. Similar subacute-chronic fractures of the left posterolateral eighth and ninth ribs. Posterior fusion T10-T11. CT ABDOMEN PELVIS FINDINGS Hepatobiliary: No acute abnormality. Pancreas: Fatty atrophy.  No acute abnormality. Spleen: Unremarkable. Adrenals/Urinary Tract: Normal adrenal glands. Nonobstructing punctate right nephrolithiasis. No obstructing ureteral calculi or hydronephrosis. Posterior and left-sided bladder wall diverticula. Stomach/Bowel: Normal caliber large and small bowel. No bowel wall thickening. Stomach and appendix are within normal limits. Vascular/Lymphatic: Aortic atherosclerosis. No enlarged abdominal or pelvic lymph nodes. Reproductive: Unremarkable. Other: No free intraperitoneal fluid or air. Fat containing ventral abdominal wall hernias. Musculoskeletal: No acute fracture CT LUMBAR SPINE FINDINGS Segmentation: 5 lumbar type vertebrae. Alignment: No evidence of traumatic listhesis. Vertebrae: No acute fracture. Paraspinal and other soft tissues: See above. Disc levels: Multilevel spondylosis with bulky anterior osteophytes, disc space height loss with vacuum phenomenon, and degenerative endplate changes. Degenerative endplate changes are greatest at L2-L3. Advanced facet arthropathy at L4-L5 and L5-S1. No severe spinal canal narrowing. IMPRESSION: 1. Acute nondisplaced fracture of the left lateral seventh rib. No pneumothorax. 2. Small right and trace left pleural effusions. 3. Scattered patchy ground-glass opacities in the lower lobes with more dense consolidation in the right middle lobe. Findings are  favored to represent infection or aspiration. 4. No acute traumatic injury in the abdomen or pelvis. 5. No acute fracture or traumatic listhesis in the lumbar spine. 6. Aortic Atherosclerosis (ICD10-I70.0). Electronically Signed   By: Minerva Fester M.D.   On: 03/15/2024 19:11   CT L-SPINE NO CHARGE Result Date: 03/15/2024 CLINICAL DATA:  Shortness of breath, left-sided rib pain since a couple days ago. Bilateral lower extremity swelling. EXAM: CT CHEST, ABDOMEN, AND PELVIS WITH CONTRAST CT lumbar spine with contrast TECHNIQUE: Multidetector CT imaging of the chest, abdomen and pelvis was performed following the standard protocol during bolus administration of intravenous contrast. Multiplanar CT images of the lumbar spine were reconstructed from contemporary CT of the Chest, Abdomen, and Pelvis. RADIATION DOSE REDUCTION: This exam was performed according to the departmental dose-optimization program which includes automated exposure control, adjustment of the mA and/or kV according to patient size and/or use of iterative reconstruction technique. CONTRAST:  OMNIPAQUE IOHEXOL 300 MG/ML  SOLN COMPARISON:  CT 01/29/2024 FINDINGS: CT CHEST FINDINGS Cardiovascular: No pericardial effusion. TAVR. Coronary artery and aortic atherosclerotic calcification Mediastinum/Nodes: Trachea and esophagus are unremarkable. No thoracic adenopathy. Lungs/Pleura: Small right and trace left pleural effusions. Bronchial wall thickening greatest in the lower  lungs. There are scattered patchy ground-glass opacities in the lower lobes. More dense consolidation in the right middle lobe. No pneumothorax. Musculoskeletal: Acute nondisplaced fracture of the left lateral seventh rib. Similar subacute-chronic fractures of the left posterolateral eighth and ninth ribs. Posterior fusion T10-T11. CT ABDOMEN PELVIS FINDINGS Hepatobiliary: No acute abnormality. Pancreas: Fatty atrophy.  No acute abnormality. Spleen: Unremarkable.  Adrenals/Urinary Tract: Normal adrenal glands. Nonobstructing punctate right nephrolithiasis. No obstructing ureteral calculi or hydronephrosis. Posterior and left-sided bladder wall diverticula. Stomach/Bowel: Normal caliber large and small bowel. No bowel wall thickening. Stomach and appendix are within normal limits. Vascular/Lymphatic: Aortic atherosclerosis. No enlarged abdominal or pelvic lymph nodes. Reproductive: Unremarkable. Other: No free intraperitoneal fluid or air. Fat containing ventral abdominal wall hernias. Musculoskeletal: No acute fracture CT LUMBAR SPINE FINDINGS Segmentation: 5 lumbar type vertebrae. Alignment: No evidence of traumatic listhesis. Vertebrae: No acute fracture. Paraspinal and other soft tissues: See above. Disc levels: Multilevel spondylosis with bulky anterior osteophytes, disc space height loss with vacuum phenomenon, and degenerative endplate changes. Degenerative endplate changes are greatest at L2-L3. Advanced facet arthropathy at L4-L5 and L5-S1. No severe spinal canal narrowing. IMPRESSION: 1. Acute nondisplaced fracture of the left lateral seventh rib. No pneumothorax. 2. Small right and trace left pleural effusions. 3. Scattered patchy ground-glass opacities in the lower lobes with more dense consolidation in the right middle lobe. Findings are favored to represent infection or aspiration. 4. No acute traumatic injury in the abdomen or pelvis. 5. No acute fracture or traumatic listhesis in the lumbar spine. 6. Aortic Atherosclerosis (ICD10-I70.0). Electronically Signed   By: Minerva Fester M.D.   On: 03/15/2024 19:11   CT Cervical Spine Wo Contrast Result Date: 03/15/2024 CLINICAL DATA:  Neck trauma (Age >= 65y) EXAM: CT CERVICAL SPINE WITHOUT CONTRAST TECHNIQUE: Multidetector CT imaging of the cervical spine was performed without intravenous contrast. Multiplanar CT image reconstructions were also generated. RADIATION DOSE REDUCTION: This exam was performed  according to the departmental dose-optimization program which includes automated exposure control, adjustment of the mA and/or kV according to patient size and/or use of iterative reconstruction technique. COMPARISON:  None Available. FINDINGS: Alignment: 9 mm of degenerative anterolisthesis of C6 on C7. 4 mm of degenerative anterolisthesis of C7 on T1. Findings stable since prior study. Skull base and vertebrae: No acute fracture. No primary bone lesion or focal pathologic process. Soft tissues and spinal canal: No prevertebral fluid or swelling. No visible canal hematoma. Disc levels: ACDF at C6-7. Posterior fusion with posterior spinal rods extending from the occipital region into the upper thoracic spine. Fusion across the facet joints diffusely. Advanced diffuse degenerative disc disease. Upper chest: Bilateral pleural effusions partially visualized. Other: None IMPRESSION: Diffuse postsurgical and degenerative changes throughout the cervical spine. No acute bony abnormality. Bilateral pleural effusions partially visualized Electronically Signed   By: Charlett Nose M.D.   On: 03/15/2024 19:02   CT Head Wo Contrast Result Date: 03/15/2024 CLINICAL DATA:  Head trauma, moderate-severe EXAM: CT HEAD WITHOUT CONTRAST TECHNIQUE: Contiguous axial images were obtained from the base of the skull through the vertex without intravenous contrast. RADIATION DOSE REDUCTION: This exam was performed according to the departmental dose-optimization program which includes automated exposure control, adjustment of the mA and/or kV according to patient size and/or use of iterative reconstruction technique. COMPARISON:  None Available. FINDINGS: Brain: There is atrophy and chronic small vessel disease changes. No acute intracranial abnormality. Specifically, no hemorrhage, hydrocephalus, mass lesion, acute infarction, or significant intracranial injury. Vascular: No hyperdense vessel or  unexpected calcification. Skull: No acute  calvarial abnormality. Sinuses/Orbits: No acute findings Other: None IMPRESSION: Atrophy, chronic microvascular disease. No acute intracranial abnormality. Electronically Signed   By: Charlett Nose M.D.   On: 03/15/2024 18:53     Assessment and Plan:   Acute on chronic HFpEF Reportedly off his lasix since previous admission. No respiratory complaints, had been having increased peripheral edema for last 2 weeks.  Has had very brisk diuresis, -8 L in the last 24 hours.  Euvolemic now. Would stop lasix after tonight's dose.  PTA was on spironolactone, can add this back tomorrow  May consider SGLT2i, if so may be able to do his home lasix 20mg  [facility]   Permanent atrial fibrillation Rate controlled heart rates generally in the 70s.  Severely dilated biatria. Continue with Eliquis 5 mg twice daily, diltiazem 120 mg, Toprol-XL 25 mg  Status post TAVR 2019 Echocardiogram in 2025 showing well-seated valve, normal device function.  Mean gradient 4.6.  Possible aspiration pneumonia Fall with nondisplaced fracture Anemia Per primary team.   Risk Assessment/Risk Scores:   New York Heart Association (NYHA) Functional Class NYHA Class II  CHA2DS2-VASc Score = 6   This indicates a 9.7% annual risk of stroke. The patient's score is based upon: CHF History: 1 HTN History: 1 Diabetes History: 0 Stroke History: 2 Vascular Disease History: 0 Age Score: 2 Gender Score: 0    For questions or updates, please contact [facility] Please consult [email or web address] for contact info under    Signed, Abagail Kitchens, PA-C  03/17/2024 12:49 PM   I have personally seen and examined the patient.  My HPI, Exam, and assessment and plan are below, independent of the NPP above.  [name], 83 year old with atrial fibrillation and congestive heart failure who presents with leg swelling and a recent fall. He is accompanied by his sister, Steward Drone.  The patient experienced a  fall resulting in after tripping over a flower pot (there have been different accounts of this from different practitioners, he said he fell outside). During his hospital stay, his leg swelling has improved significantly. He expresses a desire to manage his condition with oral medications at home.  He is eager to go home.  He has a history of congestive heart failure with reduced ejection fraction, typically managed with 20 mg of Lasix. Recently, he required an increased dose of 40 mg due to decompensation. He has been admitted multiple times in the past few months, once for a fall and another time for pneumonia. Currently, he is on spironolactone and has been receiving IV diuretics to manage fluid overload.  He has a history of atrial fibrillation, persistent since at least 2019, and has experienced atrial fibrillation with rapid ventricular response in the past. He has not had a sinus rhythm EKG since before 2019 and does not report feeling his heart 'beating out of his chest.  He has a history of aortic atherosclerosis and underwent a TAVR procedure. He also has mitral annular calcifications and LAD calcifications.  LHC in 2019 not suggestive of concerning obstructive disease.  He receives his cardiac care at the [facility] in [place], where he has regular follow-ups every six months. He is familiar with his healthcare practitioners there but could not recall specific names during the conversation.  He has some distrust with out team as, in the past, a [facility] MD stopped some of his pain medication  Exam notable for  Gen: no  distress  Neck: No JVD Cardiac: No Rubs or Gallops, systolic Murmur, IRIR Respiratory: Clear to auscultation bilaterally, normal effort, normal  respiratory rate GI: Soft, nontender, non-distended  MS: non pitting edema;  moves all extremities Integument: R leg bruising and discoloration Neuro:  At time of evaluation, alert and oriented to person/place/time/situation   In review  of EKGs in [facility]; I cannot find a sinus EKG Echocardiogram: Normal RV function, severe biatrial enlargement (01/2024) EKG: Atrial fibrillation with rapid ventricular response (03/18/2023)  In assessment and plan:   Congestive Heart Failure (CHF) (Acute on chronic HFpEF NYHA I by symptoms, hypervolemic) Congestive heart failure is well-managed with Lasix, but recent decompensation due to a fall and hospitalization led to fluid overload. Management with increased diuretics (40 mg Lasix) has achieved euvolemia and reduced leg swelling. - Continue Lasix therapy - Transition from IV to oral diuretics as fluid status stabilizes (hopefully tomorrow) - Initiate SGLT2 inhibitor therapy to reduce hospitalizations and improve mortality, with [facility] coverage - Monitor for urinary tract infections due to SGLT2 inhibitor; discussed risks with him and his sister  Atrial Fibrillation (AFib) Permanent. Cardioversion is not pursued due to chronicity and lack of symptomatic benefit. Managed by [facility] cardiology with no indication for rhythm control. - Continue rate control management (controlled on current therapy) - [facility] with [facility] for ongoing management - continue [facility]  S/p TAVR - no evidence of prosthetic valve failure, yearly echo  Aortic Atherosclerosis CAD HLD Prior CVA - no evidence of angina; he is distrustful of [place] adding new medications; after SDM discussion with attempt to focus on GDMT as above  Riley Lam, MD FASE [facility] Cardiologist [facility]  [address] [place], [place] [address] [phone]  2:28 PM

## 2024-03-17 NOTE — Progress Notes (Signed)
 Physical Therapy Treatment Patient Details Name: [name] MRN: [id number] DOB: 1940-12-30 Today's Date: 03/17/2024   History of Present Illness Pt is an 83 yo male admitted with SOB, BLE edema and L rib fx from fall a couple of days ago.  Pt with CHF and possible pneumonia.  PMH: HTN, CAD, sp TAVR, afib on eliquis, cva, multiple back surgeries.    PT Comments  Pt seen for PT tx with pt agreeable. Pt still requires significant assistance for bed mobility 2/2 L rib pain but is making progress with transfers & gait as pt is able to progress to transferring STS with as little as supervision, gait x 180 ft with RW & CGA. Pt would benefit from ongoing PT treatment to progress mobility to increase independence.    If plan is discharge home, recommend the following: A little help with walking and/or transfers;A little help with bathing/dressing/bathroom;Assistance with cooking/housework   Can travel by Training and development officer (2 wheels)    Recommendations for Other Services       Precautions / Restrictions Precautions Precautions: Fall;Other (comment) (L 7th rib fx) Recall of Precautions/Restrictions: Intact Restrictions Weight Bearing Restrictions Per Provider Order: No     Mobility  Bed Mobility               General bed mobility comments: nursing staff assists pt with supine>sit 2/2 pain    Transfers Overall transfer level: Needs assistance Equipment used: Rolling walker (2 wheels) Transfers: Sit to/from Stand Sit to Stand: Contact guard assist, Min assist, Supervision           General transfer comment: min assist fade to CGA for STS from elevated EOB, recliner, is able to progress to STS from low toilet with use of grab bar with supervision    Ambulation/Gait Ambulation/Gait assistance: Contact guard assist Gait Distance (Feet): 180 Feet (+ 180 ft) Assistive device: Rolling walker (2 wheels) Gait Pattern/deviations:  Decreased step length - right, Decreased step length - left, Decreased stride length, Trunk flexed Gait velocity: decreased     General Gait Details: cuing for upright posture, relax BUE shoulders, no overt LOB   Stairs             Wheelchair Mobility     Tilt Bed    Modified Rankin (Stroke Patients Only)       Balance Overall balance assessment: Needs assistance Sitting-balance support: Feet supported Sitting balance-Leahy Scale: Good Sitting balance - Comments: peri hygiene on toilet without assistance without LOB   Standing balance support: During functional activity, Single extremity supported Standing balance-Leahy Scale: Fair Standing balance comment: close supervision standing at sink for grooming tasks (brush teeth, wash hands)                            Communication Communication Communication: No apparent difficulties  Cognition Arousal: Alert Behavior During Therapy: WFL for tasks assessed/performed   PT - Cognitive impairments: No apparent impairments                         Following commands: Intact      Cueing Cueing Techniques: Verbal cues  Exercises      General Comments General comments (skin integrity, edema, etc.): Pt with continent BM during session. VSS throughout. Discussed sleeping in recliner vs bed since pt is still having difficulty performing bed mobility without assistance  2/2 rib pain.      Pertinent Vitals/Pain Pain Assessment Pain Assessment: Faces Faces Pain Scale: Hurts whole lot Pain Location: L rib with bed mobility, certain movements Pain Descriptors / Indicators: Grimacing, Discomfort, Guarding Pain Intervention(s): Monitored during session, Utilized relaxation techniques    Home Living                          Prior Function            PT Goals (current goals can now be found in the care plan section) Acute Rehab PT Goals Patient Stated Goal: to get home, decreased pain PT  Goal Formulation: With patient Time For Goal Achievement: 03/30/24 Potential to Achieve Goals: Good Progress towards PT goals: Progressing toward goals    Frequency    Min 2X/week      PT Plan      Co-evaluation              AM-PAC PT "6 Clicks" Mobility   Outcome Measure  Help needed turning from your back to your side while in a flat bed without using bedrails?: A Little Help needed moving from lying on your back to sitting on the side of a flat bed without using bedrails?: A Lot Help needed moving to and from a bed to a chair (including a wheelchair)?: A Little Help needed standing up from a chair using your arms (e.g., wheelchair or bedside chair)?: A Little Help needed to walk in hospital room?: A Little Help needed climbing 3-5 steps with a railing? : A Little 6 Click Score: 17    End of Session   Activity Tolerance: Patient tolerated treatment well Patient left: in chair;with call bell/phone within reach Nurse Communication: Mobility status PT Visit Diagnosis: Unsteadiness on feet (R26.81);Other abnormalities of gait and mobility (R26.89);Pain Pain - Right/Left: Left Pain - part of body:  (rib)     Time: 0981-1914 PT Time Calculation (min) (ACUTE ONLY): 36 min  Charges:    $Therapeutic Activity: 23-37 mins PT General Charges $$ ACUTE PT VISIT: 1 Visit                     Aleda Grana, PT, DPT 03/17/24, 4:10 PM   Sandi Mariscal 03/17/2024, 4:09 PM

## 2024-03-17 NOTE — Evaluation (Signed)
 Clinical/Bedside Swallow Evaluation Patient Details  Name: [name] MRN: [id number] Date of Birth: 1941/12/07  Today's Date: 03/17/2024 Time: SLP Start Time (ACUTE ONLY): 1411 SLP Stop Time (ACUTE ONLY): 1429 SLP Time Calculation (min) (ACUTE ONLY): 18 min  Past Medical History:  Past Medical History:  Diagnosis Date   Cancer (HCC)    Carpal tunnel syndrome    Cervical spondylosis with myelopathy    Chronic pain syndrome    Depression    Difficult intubation    needs smaller tube; awake oral fiberoptic scope 8.0 ETT 03/30/08   Dysphagia, pharyngeal phase    Hemorrhage of gastrointestinal tract, unspecified    History of cardiac cath    a. 03/2018 Cath: LAD and LCX with mild luminal irregularities.  No significant disease.   Iron deficiency anemia, unspecified    Lumbosacral spondylosis without myelopathy    Muscle weakness (generalized)    Other and unspecified disc disorder of cervical region    Other and unspecified hyperlipidemia    Patent foramen ovale    a. 05/2018 Echo: post-op TAVR--> + bublble study w/ L->R shunt.   Permanent atrial fibrillation (HCC)    a.  Diagnosed in the spring 2019.  Had rapid atrial fibrillation following TAVR and has been rate controlled with beta-blocker.  CHA2DS2VASc equals 7.  Supposed to be on Eliquis.   Primary localized osteoarthrosis, lower leg    Primary localized osteoarthrosis, shoulder region    Reflux esophagitis    Severe aortic stenosis    a. mild-mod AS by 01/2016 TTE, restricted AV opening with mod AR 01/2016 TEE; b. 10/2017 Echo: EF 60-65%, sev Ca2+ AoV, mean grad ; c. 05/2018 s/p TAVR ([facility]); d. 06/2018 Echo: EF 55-60%, well-seated prosth AoV, peak velocity 156cm/s. AoV gradient .   Spinal stenosis, unspecified region other than cervical    Stroke (HCC)    01/2016   Syncope and collapse    Unspecified arthropathy, lower leg    Unspecified constipation    Unspecified essential hypertension    Unspecified glaucoma(365.9)     Past Surgical History:  Past Surgical History:  Procedure Laterality Date   BILATERAL CATARACT SURGERY  2009   DR GROAT    BIOPSY  10/14/2018   Procedure: BIOPSY;  Surgeon: Graylin Shiver, MD;  Location: [facility] ENDOSCOPY;  Service: Endoscopy;;   CARDIAC VALVE REPLACEMENT  05/2018   CERVIACAL SPINE (4X)     DR MARK ROY    COLONOSCOPY  2007   DR HENSEL    COLONOSCOPY WITH PROPOFOL N/A 11/20/2017   Procedure: COLONOSCOPY WITH PROPOFOL;  Surgeon: Charlott Rakes, MD;  Location: [facility] ENDOSCOPY;  Service: Endoscopy;  Laterality: N/A;   ESOPHAGOGASTRODUODENOSCOPY (EGD) WITH PROPOFOL N/A 10/14/2018   Procedure: ESOPHAGOGASTRODUODENOSCOPY (EGD) WITH PROPOFOL;  Surgeon: Graylin Shiver, MD;  Location: [facility] ENDOSCOPY;  Service: Endoscopy;  Laterality: N/A;   JOINT REPLACEMENT     both knees   LEFT KNEE REPLACEMENT     DR ALUSIO   LEFT TRANSVERSE CARPAL LIGAMENT  01/06/2008   ROTATOR CUFF LEFT SHOULDER  2001   DR MURPHY    SHOULDER OPEN ROTATOR CUFF REPAIR  2006   DR [facility]   SPINE SURGERY     neck fusion   HPI:  Pt is an 83 yo male admitted with SOB, BLE edema and L rib fx from fall a couple of days ago. Pt with CHF and possible pneumonia. Pt had swallow studies in September 2024 due to recurrent PNA. MBS was [facility]; esophagram  showed esophageal dysmotility with gastroesophageal reflux. PMH: HTN, CAD, sp TAVR, afib on eliquis, cva, multiple back surgeries, ACDF.    Assessment / Plan / Recommendation  Clinical Impression  Pt's oropharyngeal swallow appears to be [facility]. Note that he has had swallow testing within the last several months, reportedly due to recurrent PNA (although pt denies this), and his MBS also confirmed oropharyngeal function that was functional. Esophagram at that time was significant for dysmotility and reflux, so education was reinforced about esophageal precautions. Pt insists that he follows these precautions consistently. No indication for SLP f/u at this time, but if there is  ongoing concern for aspiration, suspect that his risk may be greater post-prandially and GI consult may be considered.   SLP Visit Diagnosis: Dysphagia, unspecified (R13.10)    Aspiration Risk       Diet Recommendation Regular;Thin liquid    Liquid Administration via: Cup;Straw Medication Administration: Whole meds with liquid Supervision: Patient able to self feed;Intermittent supervision to cue for compensatory strategies Compensations: Slow rate;Small sips/bites;Follow solids with liquid Postural Changes: Seated upright at 90 degrees;Remain upright for at least 30 minutes after po intake    Other  Recommendations Oral Care Recommendations: Oral care BID    Recommendations for follow up therapy are one component of a multi-disciplinary discharge planning process, led by the attending physician.  Recommendations may be updated based on patient status, additional functional criteria and insurance authorization.  Follow up Recommendations No SLP follow up      Assistance Recommended at Discharge    Functional Status Assessment Patient has not had a recent decline in their functional status  Frequency and Duration            Prognosis        Swallow Study   General HPI: Pt is an 83 yo male admitted with SOB, BLE edema and L rib fx from fall a couple of days ago. Pt with CHF and possible pneumonia. Pt had swallow studies in September 2024 due to recurrent PNA. MBS was [facility]; esophagram showed esophageal dysmotility with gastroesophageal reflux. PMH: HTN, CAD, sp TAVR, afib on eliquis, cva, multiple back surgeries, ACDF. Type of Study: Bedside Swallow Evaluation Previous Swallow Assessment: see HPI Diet Prior to this Study: Regular;Thin liquids (Level 0) Temperature Spikes Noted: No Respiratory Status: Room air History of Recent Intubation: No Behavior/Cognition: Alert;Cooperative Oral Cavity Assessment: Within Functional Limits Oral Care Completed by SLP: No Oral Cavity -  Dentition: Adequate natural dentition Vision: Functional for self-feeding Self-Feeding Abilities: Able to feed self Patient Positioning: Upright in bed Baseline Vocal Quality: Other (comment) (rough) Volitional Cough: Strong Volitional Swallow: Able to elicit    Oral/Motor/Sensory Function Overall Oral Motor/Sensory Function: Within functional limits   Ice Chips Ice chips: Not tested   Thin Liquid Thin Liquid: Within functional limits Presentation: Self Fed;Straw    Nectar Thick Nectar Thick Liquid: Not tested   Honey Thick Honey Thick Liquid: Not tested   Puree Puree: Within functional limits Presentation: Self Fed;Spoon   Solid     Solid: Within functional limits Presentation: Self Fed      Mahala Menghini., M.A. CCC-SLP Acute Rehabilitation Services Office [phone]  Secure chat preferred  03/17/2024,3:19 PM

## 2024-03-18 DIAGNOSIS — I5033 Acute on chronic diastolic (congestive) heart failure: Secondary | ICD-10-CM | POA: Diagnosis not present

## 2024-03-18 LAB — CBC
HCT: 37.1 % — ABNORMAL LOW (ref 39.0–52.0)
Hemoglobin: 12 g/dL — ABNORMAL LOW (ref 13.0–17.0)
MCH: 31.4 pg (ref 26.0–34.0)
MCHC: 32.3 g/dL (ref 30.0–36.0)
MCV: 97.1 fL (ref 80.0–100.0)
Platelets: 260 10*3/uL (ref 150–400)
RBC: 3.82 MIL/uL — ABNORMAL LOW (ref 4.22–5.81)
RDW: 14.6 % (ref 11.5–15.5)
WBC: 7.5 10*3/uL (ref 4.0–10.5)
nRBC: 0 % (ref 0.0–0.2)

## 2024-03-18 LAB — COMPREHENSIVE METABOLIC PANEL WITH GFR
ALT: 20 U/L (ref 0–44)
AST: 28 U/L (ref 15–41)
Albumin: 2.8 g/dL — ABNORMAL LOW (ref 3.5–5.0)
Alkaline Phosphatase: 60 U/L (ref 38–126)
Anion gap: 7 (ref 5–15)
BUN: 17 mg/dL (ref 8–23)
CO2: 35 mmol/L — ABNORMAL HIGH (ref 22–32)
Calcium: 9.2 mg/dL (ref 8.9–10.3)
Chloride: 97 mmol/L — ABNORMAL LOW (ref 98–111)
Creatinine, Ser: 1.12 mg/dL (ref 0.61–1.24)
GFR, Estimated: >60 mL/min (ref >60)
Glucose, Bld: 93 mg/dL (ref 70–99)
Potassium: 4.9 mmol/L (ref 3.5–5.1)
Sodium: 139 mmol/L (ref 135–145)
Total Bilirubin: 0.8 mg/dL (ref 0.0–1.2)
Total Protein: 6.1 g/dL — ABNORMAL LOW (ref 6.5–8.1)

## 2024-03-18 MED ORDER — FUROSEMIDE 40 MG PO TABS
40.0000 mg | ORAL_TABLET | Freq: Every day | ORAL | Status: DC
  Administered 2024-03-19: 40 mg via ORAL
  Filled 2024-03-18: qty 1

## 2024-03-18 MED ORDER — SPIRONOLACTONE 12.5 MG HALF TABLET
12.5000 mg | ORAL_TABLET | Freq: Every day | ORAL | Status: DC
  Administered 2024-03-18 – 2024-03-19 (×2): 12.5 mg via ORAL
  Filled 2024-03-18 (×2): qty 1

## 2024-03-18 MED ORDER — FUROSEMIDE 20 MG PO TABS
20.0000 mg | ORAL_TABLET | Freq: Every day | ORAL | Status: DC
  Administered 2024-03-18: 20 mg via ORAL
  Filled 2024-03-18: qty 1

## 2024-03-18 MED ORDER — CEFUROXIME AXETIL 250 MG PO TABS
250.0000 mg | ORAL_TABLET | Freq: Two times a day (BID) | ORAL | Status: DC
  Administered 2024-03-18 – 2024-03-19 (×2): 250 mg via ORAL
  Filled 2024-03-18 (×2): qty 1

## 2024-03-18 NOTE — Progress Notes (Addendum)
 Patient Name: [name] Date of Encounter: 03/18/2024 [facility] Cardiologist: [facility].  Interval Summary  .    Feels good no chest pain or shortness of breath.  Continues to diurese very well.  Feels like he is back at baseline.  Vital Signs .    Vitals:   03/17/24 2244 03/18/24 0117 03/18/24 0520 03/18/24 0724  BP: 103/75 124/74 111/70 112/71  Pulse: 71 82 89 84  Resp: (!) 25 16 16 15   Temp:  97.7 F (36.5 C) 97.6 F (36.4 C) 97.6 F (36.4 C)  TempSrc:  Oral Oral Oral  SpO2: 98% 94% 92% 100%  Weight:   100 kg     Intake/Output Summary (Last 24 hours) at 03/18/2024 0756 Last data filed at 03/18/2024 0752 Gross per 24 hour  Intake 240 ml  Output 4300 ml  Net -4060 ml      03/18/2024    5:20 AM 03/17/2024    4:52 AM 03/16/2024    4:40 AM  Last 3 Weights  Weight (lbs) 220 lb 7.4 oz 223 lb 15.8 oz 240 lb 4.8 oz  Weight (kg) 100 kg 101.6 kg 109 kg      Telemetry/ECG    Atrial fibrillation heart rates 80s to 90s- Personally Reviewed  CV Studies    Echocardiogram 01/30/2024  1. Left ventricular ejection fraction, by estimation, is 65 to 70%. The  left ventricle has normal function. The left ventricle has no regional  wall motion abnormalities. There is mild concentric left ventricular  hypertrophy. Left ventricular diastolic  function could not be evaluated.   2. Right ventricular systolic function is normal. The right ventricular  size is normal. There is normal pulmonary artery systolic pressure.   3. Left atrial size was severely dilated.   4. Right atrial size was severely dilated.   5. The mitral valve is normal in structure. No evidence of mitral valve  regurgitation. No evidence of mitral stenosis.   6. The aortic valve has been repaired/replaced. Aortic valve  regurgitation is not visualized. No aortic stenosis is present. There is a  34 CoreValve-EvolutR prosthetic (TAVR) valve present in the aortic  position. Procedure Date:  05/2018. Echo findings  are consistent with normal structure and function of the aortic valve  prosthesis. Aortic valve area, by VTI measures 1.69 cm. Aortic valve mean  gradient measures 4.6 mmHg. Aortic valve Vmax measures 1.46 m/s.   7. The inferior vena cava is normal in size with greater than 50%  respiratory variability, suggesting right atrial pressure of 3 mmHg.    Cardiac cath 03/2018 (care everywhere)  patent coronary arteries with very mild, luminal irregularities in the LAD and left Cx  Physical Exam .   GEN: No acute distress.   Neck: No JVD Cardiac: RRR, no murmurs, rubs, or gallops.  Respiratory: Clear to auscultation bilaterally. GI: Soft, nontender, non-distended  MS: No edema  Patient Profile    [name] is a 83 y.o. male has hx of   permanent atrial fibrillation, severe AS status post TAVR 05/2018, PFO, hypertension, hyperlipidemia, CVA.  Admitted for HFpEF exacerbation.  Had not been taking his Lasix.  Assessment & Plan .     Acute on chronic HFpEF Reportedly off his lasix since previous admission. No respiratory complaints, had been having increased peripheral edema for last 2 weeks.    Has had very brisk diuresis.  Another -3.5 L.  Admission weight was 240.  Today he is  220 pounds with no symptoms and feels back at baseline.   Transition to PTA p.o. Lasix 20 mg. Potassium is 4.9 today.  Will reduce his spironolactone to 12.5.  Jardiance 10 mg also added.   He [facility] in the next 2 weeks to get labs done. Discussed ongoing management and need for daily weights and monitoring of peripheral edema.   Permanent atrial fibrillation Rate controlled heart rates generally in the 70s-90.  Severely dilated biatria.  No plans of cardioversion.  Currently rate control strategy. Continue with Eliquis 5 mg twice daily, diltiazem 120 mg, Toprol-XL 25 mg   Status post TAVR 2019 Echocardiogram in 2025 showing well-seated valve, normal device  function.  Mean gradient 4.6.  Aortic atherosclerosis Hyperlipidemia Noted on CT imaging.  Previous cardiac catheterization 2019 without any significant disease. Continue atorvastatin 20 mg.   Possible aspiration pneumonia Fall with nondisplaced fracture Anemia Per primary team.  For questions or updates, please contact [facility] Please consult [email or web address] for contact info under        Signed, Abagail Kitchens, PA-C    I have personally seen and examined the patient.  My HPI, Exam, and assessment and plan are below, independent of the NPP above.  No symptoms.  Eager to leave.  He notes that his ribs hurt from the fall and feels we are keeping him in bed.   Exam notable for  Gen: no distress  Neck: No JVD Cardiac: No Rubs or Gallops, systolic Murmur, IRIR Respiratory: Clear to auscultation bilaterally, normal effort, normal  respiratory rate; on exam weaned off O2 Sats 96-100 on my exam GI: Soft, nontender, non-distended  MS: improving edema;  moves all extremities Integument: Skin feels warm, R leg bruising is improving Neuro:  At time of evaluation, alert and oriented to person/place/time/situation  Psych: Normal affect, patient feels warm  Tele: AF- rate controlled  In assessment and plan:   Acute on Chronic HFpEF: transitioned to po lasix, SGLT2i and lower dose MRA; should have [facility] coverage for this; outpatient f/u - Increased PO diuretic dose to 40 mg given active related Sats in the 90s from 03/16/24   Atrial Fibrillation (AFib) Permanent - rate controlled on current therapy, tolerated AC- Coordinate with [facility] cardiologist for ongoing management - continue [facility]   S/p TAVR - no evidence of prosthetic valve failure, yearly echo  CAC with   Aortic Atherosclerosis HLD Prior CVA - LDL goal at least under 70; outpatient f/u   He is planned for DC tomorrow per patent, this appears reasonable from a cardiology side.  Needs [facility] f/u; needs to ambulate may  have  Will sign off- please reach out 4/3 if new concerns of persistent hypoxia  Riley Lam, MD FASE [facility] Cardiologist [facility]  [address] [place], [place] [address] [phone]  10:12 AM

## 2024-03-18 NOTE — Progress Notes (Signed)
 Occupational Therapy Treatment Patient Details Name: [name] MRN: [id number] DOB: 1941/11/22 Today's Date: 03/18/2024   History of present illness Pt is an 83 yo male admitted with SOB, BLE edema and L rib fx from fall a couple of days ago.  Pt with CHF and possible pneumonia.  PMH: HTN, CAD, sp TAVR, afib on eliquis, cva, multiple back surgeries.   OT comments  Pt making progress with functional goals. Pt in recliner upon arrival on 2L O2 [place] 91-93% SATs. Pt limited by pain and SOB during in room activity with O2 SATs 84-87% on RA, pt placed back on O2. Cues to relax and use pursed lip, deep breathing techniques to help recover to >90%. Initiated Energy conservation trg. OT will continue to follow acutely to maximize level of function and safety       If plan is discharge home, recommend the following:  A little help with walking and/or transfers;A little help with bathing/dressing/bathroom;Supervision due to cognitive status   Equipment Recommendations  Other (comment) Lexicographer)    Recommendations for Other Services      Precautions / Restrictions Precautions Precautions: Fall;Other (comment) Recall of Precautions/Restrictions: Intact Precaution/Restrictions Comments: L rib fx Restrictions Weight Bearing Restrictions Per Provider Order: No       Mobility Bed Mobility               General bed mobility comments: pt in recliner upon arrival    Transfers Overall transfer level: Needs assistance Equipment used: Rolling walker (2 wheels) Transfers: Sit to/from Stand Sit to Stand: Contact guard assist     Step pivot transfers: Contact guard assist           Balance Overall balance assessment: Needs assistance Sitting-balance support: Feet supported Sitting balance-Leahy Scale: Good     Standing balance support: During functional activity, Single extremity supported Standing balance-Leahy Scale: Fair                             ADL either  performed or assessed with clinical judgement   ADL Overall ADL's : Needs assistance/impaired     Grooming: Wash/dry hands;Wash/dry face;Contact guard assist;Standing           Upper Body Dressing : Minimal assistance;Sitting       Toilet Transfer: Contact guard assist;Ambulation;Rolling walker (2 wheels);Regular Toilet;BSC/3in1;Grab bars   Toileting- Clothing Manipulation and Hygiene: Minimal assistance;Sit to/from stand       Functional mobility during ADLs: Minimal assistance;Rolling walker (2 wheels) General ADL Comments: Pt limited by pain and SOB. O2 SATs 84-87% on RA, pt placed back on O2.  Cues to relax and use pursed lip, deep breathing techniques to help recover to >90%. Initiated Energy conservation trg    Extremity/Trunk Assessment Upper Extremity Assessment Upper Extremity Assessment: Generalized weakness;Right hand dominant;LUE deficits/detail RUE Deficits / Details: Limited in shoulder flexion and abduction to 90 and has had B shoulder replacements   Lower Extremity Assessment Lower Extremity Assessment: Defer to PT evaluation   Cervical / Trunk Assessment Cervical / Trunk Assessment: Back Surgery    Vision Baseline Vision/History: 0 No visual deficits Ability to See in Adequate Light: 0 Adequate Patient Visual Report: No change from baseline     Perception     Praxis     Communication Communication Communication: No apparent difficulties   Cognition Arousal: Alert Behavior During Therapy: [facility] for tasks assessed/performed  Following commands: Intact        Cueing   Cueing Techniques: Verbal cues  Exercises      Shoulder Instructions       General Comments      Pertinent Vitals/ Pain       Pain Assessment Pain Assessment: 0-10 Pain Score: 5  Pain Location: L ribs with mobility Pain Descriptors / Indicators: Grimacing, Discomfort, Guarding Pain Intervention(s): Limited activity within  patient's tolerance, Monitored during session, Premedicated before session, Repositioned, Utilized relaxation techniques  Home Living                                          Prior Functioning/Environment              Frequency  Min 2X/week        Progress Toward Goals  OT Goals(current goals can now be found in the care plan section)  Progress towards OT goals: Progressing toward goals     Plan      Co-evaluation                 AM-PAC OT "6 Clicks" Daily Activity     Outcome Measure   Help from another person eating meals?: None Help from another person taking care of personal grooming?: A Little Help from another person toileting, which includes using toliet, bedpan, or urinal?: A Little Help from another person bathing (including washing, rinsing, drying)?: A Lot Help from another person to put on and taking off regular upper body clothing?: A Little Help from another person to put on and taking off regular lower body clothing?: A Lot 6 Click Score: 17    End of Session Equipment Utilized During Treatment: Rolling walker (2 wheels);Other (comment);Gait belt (3 in 1)  OT Visit Diagnosis: Unsteadiness on feet (R26.81);History of falling (Z91.81);Pain Pain - Right/Left: Left Pain - part of body:  (ribs)   Activity Tolerance Patient limited by pain   Patient Left in chair;with call bell/phone within reach   Nurse Communication          Time: 3016-0109 OT Time Calculation (min): 16 min  Charges: OT General Charges $OT Visit: 1 Visit OT Treatments $Self Care/Home Management : 8-22 mins    Galen Manila 03/18/2024, 3:23 PM

## 2024-03-18 NOTE — Progress Notes (Signed)
 PROGRESS NOTE    [name]  GEX:[id number] DOB: 10-Oct-1941 DOA: 03/15/2024 PCP: Dois Davenport, MD  82/M with chronic diastolic CHF, CAD, TAVR, chronic A-fib on Eliquis, CVA, chronic back pain, multiple back surgeries, narcotic dependence, depression, recently hospitalized 2/12-14 for A-fib RVR, started on diltiazem and then.  Back in the 3/31 with shortness of breath edema and left-sided chest/rib pain, leg pain following a fall.  In the ED he was noted to be volume overloaded, hemoglobin 10.8, BNP 578, troponin negative, CT chest noted fracture of lateral seventh rib, small pleural effusions and patchy airspace opacity in lower lobe with more dense consolidation in the right middle lobe.  Admitted overnight, started on IV Lasix and antibiotics   Subjective: Upset about not getting adequate pain meds last night  Assessment and Plan:  Acute on chronic HFpEF Acute hypoxic respiratory failure -Recent echo 2/25 noted EF of 65-70%, normal RV -Diuresed well with IV Lasix, weight down 20 LB, transition to oral Lasix, continue Aldactone, Jardiance  -Appreciate cards input, felt to be permanent A-fib, recommended rate control strategy -Wean O2 today, discharge planning, home tomorrow   Possible aspiration pneumonia - CT showing scattered patchy groundglass opacities in the lower lobes with more dense consolidation in the right middle lobe suspicious for infection or aspiration.  -Treated with IV Unasyn initially, changed to oral cefuroxime today -SLP eval completed and unremarkable  Permanent atrial fibrillation -Rate controlled, continue diltiazem and Eliquis -Discussed with cards, rate control strategy recommended   Acute nondisplaced fracture of the left lateral seventh rib Secondary to a mechanical fall.  -Supportive care, no pneumothorax on imaging  Normocytic anemia -Hemoglobin slightly lower than baseline, could be dilutional, improving with diuresis   Hypertension Stable.   Continue diltiazem and metoprolol.   CAD Workup not suggestive of ACS.  Continue aspirin, metoprolol, and simvastatin.  Chronic pain Narcotic dependence -Restarted home regimen of Percocet and gabapentin   DVT prophylaxis: Eliquis Code Status: Full Code (discussed with the patient) Family Communication: No family available at this time. Disposition: Home tomorrow  Consultants:    Procedures:   Antimicrobials:    Objective: Vitals:   03/17/24 2244 03/18/24 0117 03/18/24 0520 03/18/24 0724  BP: 103/75 124/74 111/70 112/71  Pulse: 71 82 89 84  Resp: (!) 25 16 16 15   Temp:  97.7 F (36.5 C) 97.6 F (36.4 C) 97.6 F (36.4 C)  TempSrc:  Oral Oral Oral  SpO2: 98% 94% 92% 100%  Weight:   100 kg     Intake/Output Summary (Last 24 hours) at 03/18/2024 1033 Last data filed at 03/18/2024 0847 Gross per 24 hour  Intake 477 ml  Output 3600 ml  Net -3123 ml   Filed Weights   03/16/24 0440 03/17/24 0452 03/18/24 0520  Weight: 109 kg 101.6 kg 100 kg    Examination:  General exam: Chronically ill laying in bed, AAO x 2, mild cognitive deficits Respiratory system: Improving air movement Cardiovascular system: S1 & S2 heard, irregular rhythm Abd: nondistended, soft and nontender.Normal bowel sounds heard. Extremities: no  edema Skin: No rashes Psychiatry:  Mood & affect appropriate.     Data Reviewed:   CBC: Recent Labs  Lab 03/15/24 1636 03/16/24 0903 03/17/24 0354 03/18/24 0243  WBC 6.3 5.9 6.4 7.5  HGB 10.8* 11.4* 12.0* 12.0*  HCT 33.9* 34.5* 35.6* 37.1*  MCV 100.0 98.0 97.3 97.1  PLT 194 219 244 260   Basic Metabolic Panel: Recent Labs  Lab 03/15/24 1649 03/16/24 0903  03/17/24 0354 03/18/24 0243  NA 135 137 137 139  K 4.2 4.0 3.6 4.9  CL 101 104 98 97*  CO2 28 26 30  35*  GLUCOSE 101* 106* 92 93  BUN 22 15 13 17   CREATININE 1.15 1.11 1.04 1.12  CALCIUM 8.8* 9.1 9.1 9.2  MG  --   --  1.9  --    GFR: Estimated Creatinine Clearance: 60.3 mL/min  (by C-G formula based on SCr of 1.12 mg/dL). Liver Function Tests: Recent Labs  Lab 03/15/24 1649 03/17/24 0354 03/18/24 0243  AST 26 22 28   ALT 19 20 20   ALKPHOS 73 65 60  BILITOT 0.5 0.7 0.8  PROT 6.9 6.2* 6.1*  ALBUMIN 3.5 2.8* 2.8*   No results for input(s): "LIPASE", "AMYLASE" in the last 168 hours. No results for input(s): "AMMONIA" in the last 168 hours. Coagulation Profile: Recent Labs  Lab 03/15/24 1636  INR 1.0   Cardiac Enzymes: No results for input(s): "CKTOTAL", "CKMB", "CKMBINDEX", "TROPONINI" in the last 168 hours. BNP (last 3 results) No results for input(s): "PROBNP" in the last 8760 hours. HbA1C: No results for input(s): "HGBA1C" in the last 72 hours. CBG: No results for input(s): "GLUCAP" in the last 168 hours. Lipid Profile: No results for input(s): "CHOL", "HDL", "LDLCALC", "TRIG", "CHOLHDL", "LDLDIRECT" in the last 72 hours. Thyroid Function Tests: No results for input(s): "TSH", "T4TOTAL", "FREET4", "T3FREE", "THYROIDAB" in the last 72 hours. Anemia Panel: Recent Labs    03/16/24 0903 03/17/24 1300  VITAMINB12 326 260  FOLATE 29.0  --   FERRITIN 263  --   TIBC 337  --   IRON 40*  --   RETICCTPCT 1.5  --    Urine analysis:    Component Value Date/Time   COLORURINE YELLOW 01/29/2024 2122   APPEARANCEUR CLEAR 01/29/2024 2122   LABSPEC 1.024 01/29/2024 2122   PHURINE 7.0 01/29/2024 2122   GLUCOSEU NEGATIVE 01/29/2024 2122   HGBUR NEGATIVE 01/29/2024 2122   HGBUR negative 04/08/2007 1327   BILIRUBINUR NEGATIVE 01/29/2024 2122   KETONESUR 5 (A) 01/29/2024 2122   PROTEINUR NEGATIVE 01/29/2024 2122   UROBILINOGEN 0.2 04/24/2010 1617   NITRITE NEGATIVE 01/29/2024 2122   LEUKOCYTESUR NEGATIVE 01/29/2024 2122   Sepsis Labs: @LABRCNTIP (procalcitonin:4,lacticidven:4)  ) Recent Results (from the past 240 hours)  SARS Coronavirus 2 by RT PCR (hospital order, performed in [facility] hospital lab) *cepheid single result test* Anterior Nasal  Swab     Status: None   Collection Time: 03/16/24  7:01 AM   Specimen: Anterior Nasal Swab  Result Value Ref Range Status   SARS Coronavirus 2 by RT PCR NEGATIVE NEGATIVE Final    Comment: Performed at [facility], [address], [place], [place] [address]  Respiratory (~20 pathogens) panel by PCR     Status: None   Collection Time: 03/16/24  7:01 AM   Specimen: Nasopharyngeal Swab; Respiratory  Result Value Ref Range Status   Adenovirus NOT DETECTED NOT DETECTED Final   Coronavirus 229E NOT DETECTED NOT DETECTED Final    Comment: (NOTE) The Coronavirus on the Respiratory Panel, DOES NOT test for the novel  Coronavirus (2019 nCoV)    Coronavirus HKU1 NOT DETECTED NOT DETECTED Final   Coronavirus NL63 NOT DETECTED NOT DETECTED Final   Coronavirus OC43 NOT DETECTED NOT DETECTED Final   Metapneumovirus NOT DETECTED NOT DETECTED Final   Rhinovirus / Enterovirus NOT DETECTED NOT DETECTED Final   Influenza A NOT DETECTED NOT DETECTED Final   Influenza  B NOT DETECTED NOT DETECTED Final   Parainfluenza Virus 1 NOT DETECTED NOT DETECTED Final   Parainfluenza Virus 2 NOT DETECTED NOT DETECTED Final   Parainfluenza Virus 3 NOT DETECTED NOT DETECTED Final   Parainfluenza Virus 4 NOT DETECTED NOT DETECTED Final   Respiratory Syncytial Virus NOT DETECTED NOT DETECTED Final   Bordetella pertussis NOT DETECTED NOT DETECTED Final   Bordetella Parapertussis NOT DETECTED NOT DETECTED Final   Chlamydophila pneumoniae NOT DETECTED NOT DETECTED Final   Mycoplasma pneumoniae NOT DETECTED NOT DETECTED Final    Comment: Performed at [facility], [address], [place], [place] [address]     Radiology Studies: No results found.    Scheduled Meds:  apixaban  5 mg Oral BID   atorvastatin  20 mg Oral QHS   buPROPion  100 mg Oral BID   diltiazem  120 mg Oral Daily   empagliflozin  10 mg Oral Daily   [START ON 03/19/2024] furosemide  40 mg Oral Daily   gabapentin  300 mg Oral TID    metoprolol succinate  25 mg Oral Daily   rOPINIRole  0.25 mg Oral BID   spironolactone  12.5 mg Oral Daily   topiramate  25 mg Oral QHS   Continuous Infusions:  ampicillin-sulbactam (UNASYN) IV 3 g (03/18/24 0526)     LOS: 2 days    Time spent:    Zannie Cove, MD [facility]   03/18/2024, 10:33 AM

## 2024-03-18 NOTE — Plan of Care (Signed)
  Problem: Health Behavior/Discharge Planning: Goal: Ability to manage health-related needs will improve Outcome: Progressing   Problem: Clinical Measurements: Goal: Ability to maintain clinical measurements within normal limits will improve Outcome: Progressing   Problem: Pain Managment: Goal: General experience of comfort will improve and/or be controlled Outcome: Progressing   Problem: Safety: Goal: Ability to remain free from injury will improve Outcome: Progressing

## 2024-03-19 ENCOUNTER — Other Ambulatory Visit (HOSPITAL_COMMUNITY): Payer: Self-pay

## 2024-03-19 DIAGNOSIS — I5033 Acute on chronic diastolic (congestive) heart failure: Secondary | ICD-10-CM | POA: Diagnosis not present

## 2024-03-19 LAB — BASIC METABOLIC PANEL WITH GFR
Anion gap: 10 (ref 5–15)
BUN: 21 mg/dL (ref 8–23)
CO2: 28 mmol/L (ref 22–32)
Calcium: 9.1 mg/dL (ref 8.9–10.3)
Chloride: 98 mmol/L (ref 98–111)
Creatinine, Ser: 1.14 mg/dL (ref 0.61–1.24)
GFR, Estimated: >60 mL/min (ref >60)
Glucose, Bld: 90 mg/dL (ref 70–99)
Potassium: 4.2 mmol/L (ref 3.5–5.1)
Sodium: 136 mmol/L (ref 135–145)

## 2024-03-19 MED ORDER — SPIRONOLACTONE 25 MG PO TABS: 12.5000 mg | ORAL_TABLET | Freq: Every day | ORAL | Status: DC

## 2024-03-19 MED ORDER — LIDOCAINE 5 % EX PTCH
1.0000 | MEDICATED_PATCH | CUTANEOUS | Status: DC
  Administered 2024-03-19: 1 via TRANSDERMAL
  Filled 2024-03-19: qty 1

## 2024-03-19 MED ORDER — LIDOCAINE 5 % EX PTCH: 2.0000 | MEDICATED_PATCH | CUTANEOUS | Status: DC

## 2024-03-19 MED ORDER — LIDOCAINE 5 % EX PTCH
1.0000 | MEDICATED_PATCH | Freq: Once | CUTANEOUS | Status: DC
  Administered 2024-03-19: 1 via TRANSDERMAL
  Filled 2024-03-19: qty 1

## 2024-03-19 MED ORDER — FUROSEMIDE 40 MG PO TABS
40.0000 mg | ORAL_TABLET | Freq: Every day | ORAL | 1 refills | Status: DC
  Filled 2024-03-19: qty 30, 30d supply, fill #0

## 2024-03-19 MED ORDER — LIDOCAINE 5 % EX PTCH: 1.0000 | MEDICATED_PATCH | CUTANEOUS | Status: DC

## 2024-03-19 MED ORDER — DILTIAZEM HCL ER COATED BEADS 120 MG PO CP24
120.0000 mg | ORAL_CAPSULE | Freq: Every day | ORAL | 1 refills | Status: DC
  Filled 2024-03-19: qty 30, 30d supply, fill #0

## 2024-03-19 MED ORDER — EMPAGLIFLOZIN 10 MG PO TABS
10.0000 mg | ORAL_TABLET | Freq: Every day | ORAL | 1 refills | Status: DC
  Filled 2024-03-19: qty 30, 30d supply, fill #0

## 2024-03-19 NOTE — Discharge Summary (Signed)
 Physician Discharge Summary  [name] UJW:[id number] DOB: [birth year] DOA: 03/15/2024  PCP: Dois Davenport, MD  Admit date: 03/15/2024 Discharge date: 03/19/2024  Time spent: 45 minutes  Recommendations for Outpatient Follow-up:  Cardiology at [facility] in 2 to 3 weeks Please check BMP at follow-up   Discharge Diagnoses:  Principal Problem: Acute hypoxic respiratory failure Acute on chronic diastolic CHF Permanent atrial fibrillation Seventh rib fracture Normocytic anemia CAD Chronic pain Narcotic dependence History of TAVR History of CVA Chronic back pain Depression  Discharge Condition: Improved  Diet recommendation: Low-sodium, heart healthy  Filed Weights   03/17/24 0452 03/18/24 0520 03/19/24 0513  Weight: 101.6 kg 100 kg 99.4 kg    History of present illness:  82/M with chronic diastolic CHF, CAD, TAVR, chronic A-fib on Eliquis, CVA, chronic back pain, multiple back surgeries, narcotic dependence, depression, recently hospitalized 2/12-14 for A-fib RVR, started on diltiazem and then. Back in the 3/31 with shortness of breath edema and left-sided chest/rib pain, leg pain following a fall. In the ED he was noted to be volume overloaded, hemoglobin 10.8, BNP 578, troponin negative, CT chest noted fracture of lateral seventh rib, small pleural effusions and patchy airspace opacity in lower lobe with more dense consolidation in the right middle lobe.  Hospital Course: ]  Acute on chronic HFpEF Acute hypoxic respiratory failure -Recent echo 2/25 noted EF of 65-70%, normal RV -Diuresed well with IV Lasix, weight down 20 LB, transition to oral Lasix, continue Aldactone, Jardiance  -Appreciate cards input, felt to be permanent A-fib, recommended rate control strategy -Weaned off O2 -Follow-up with cardiology at [facility]   Possible aspiration pneumonia - CT showing scattered patchy groundglass opacities in the lower lobes with more dense consolidation in the right middle lobe  suspicious for infection or aspiration.  -Treated with IV Unasyn initially, changed to oral cefuroxime yesterday, completed 4 days of antibiotics, low clinical suspicion for infection, discontinue antibiotics -SLP eval completed and unremarkable   Permanent atrial fibrillation -Rate controlled, continue diltiazem and Eliquis -Discussed with cards, rate control strategy recommended   Acute nondisplaced fracture of the left lateral seventh rib Secondary to a mechanical fall.  -Supportive care, no pneumothorax on imaging   Normocytic anemia -Hemoglobin slightly lower than baseline, could be dilutional, improving with diuresis   Hypertension Stable.  Continue diltiazem and metoprolol.   CAD Workup not suggestive of ACS.  Continue aspirin, metoprolol, and simvastatin.   Chronic pain Narcotic dependence -Restarted home regimen of Percocet and gabapentin  Discharge Exam: Vitals:   03/19/24 0513 03/19/24 0710  BP: 105/69 112/67  Pulse: 83 89  Resp: 18 16  Temp: 97.6 F (36.4 C) 98 F (36.7 C)  SpO2: 99% 100%   General exam: Chronically ill laying in bed, AAO x 2, mild cognitive deficits Respiratory system: Improving air movement Cardiovascular system: S1 & S2 heard, irregular rhythm Abd: nondistended, soft and nontender.Normal bowel sounds heard. Extremities: no  edema Skin: No rashes Psychiatry:  Mood & affect appropriate.   Discharge Instructions   Discharge Instructions     Diet - low sodium heart healthy   Complete by: As directed    Diet - low sodium heart healthy   Complete by: As directed    Increase activity slowly   Complete by: As directed    Increase activity slowly   Complete by: As directed       Allergies as of 03/19/2024       Reactions   Ativan [lorazepam] Other (See Comments)  Delirium   Lyrica [pregabalin] Swelling, Other (See Comments)   Triggered PTSD Nightmares Aggression   Zanaflex [tizanidine Hcl] Swelling, Anxiety   Cymbalta  [duloxetine Hcl] Other (See Comments)   Increased depression Irritability         Medication List     TAKE these medications    apixaban 5 MG Tabs tablet Commonly known as: ELIQUIS Take 5 mg by mouth 2 (two) times daily.   aspirin EC 81 MG tablet Take 81 mg by mouth daily.   buPROPion 100 MG tablet Commonly known as: WELLBUTRIN Take 100 mg by mouth 2 (two) times daily.   diltiazem 120 MG 24 hr capsule Commonly known as: CARDIZEM CD Take 1 capsule (120 mg total) by mouth daily.   ferrous sulfate 325 (65 FE) MG tablet Take 325 mg by mouth daily with supper.   fluticasone 50 MCG/ACT nasal spray Commonly known as: FLONASE Place 1 spray into both nostrils daily as needed for allergies or rhinitis.   furosemide 40 MG tablet Commonly known as: LASIX Take 1 tablet (40 mg total) by mouth daily. What changed:  medication strength how much to take   gabapentin 800 MG tablet Commonly known as: NEURONTIN Take 800 mg by mouth 3 (three) times daily.   Jardiance 10 MG Tabs tablet Generic drug: empagliflozin Take 1 tablet (10 mg total) by mouth daily.   lidocaine 5 % Commonly known as: LIDODERM Place 1 patch onto the skin daily as needed (pain).   metoprolol succinate 25 MG 24 hr tablet Commonly known as: TOPROL-XL Take 25 mg by mouth daily.   oxyCODONE-acetaminophen 10-325 MG tablet Commonly known as: PERCOCET Take 1 tablet by mouth 5 (five) times daily.   potassium chloride SA 20 MEQ tablet Commonly known as: KLOR-CON M Take 1 tablet (20 mEq total) by mouth daily. What changed: when to take this   rOPINIRole 0.25 MG tablet Commonly known as: REQUIP Take 0.25 mg by mouth 2 (two) times daily.   silver sulfADIAZINE 1 % cream Commonly known as: SILVADENE Apply 1 Application topically daily as needed (leg sores, wound healing).   simvastatin 40 MG tablet Commonly known as: ZOCOR Take 40 mg by mouth at bedtime.   spironolactone 25 MG tablet Commonly known as:  ALDACTONE Take 0.5 tablets (12.5 mg total) by mouth daily. What changed: how much to take   tamsulosin 0.4 MG Caps capsule Commonly known as: FLOMAX Take 0.8 mg by mouth 2 (two) times daily.   topiramate 25 MG capsule Commonly known as: TOPAMAX Take 25 mg by mouth at bedtime.       Allergies  Allergen Reactions   Ativan [Lorazepam] Other (See Comments)    Delirium   Lyrica [Pregabalin] Swelling and Other (See Comments)    Triggered PTSD Nightmares Aggression   Zanaflex [Tizanidine Hcl] Swelling and Anxiety   Cymbalta [Duloxetine Hcl] Other (See Comments)    Increased depression Irritability     Follow-up Information     [facility] Follow up.   Specialty: Home Health Services Why: Agency will call you to set up apt times Contact information: [address] [place] [address] [phone]         Cardiology at [facility]. Schedule an appointment as soon as possible for a visit in 2 week(s).          Dois Davenport, MD. Go on 03/23/2024.   Specialty: Family Medicine Why: @3 :00pm Contact information: [address] [place]  [address] [phone]                  The results of significant diagnostics from this hospitalization (including imaging, microbiology, ancillary and laboratory) are listed below for reference.    Significant Diagnostic Studies: CT CHEST ABDOMEN PELVIS W CONTRAST Result Date: 03/15/2024 CLINICAL DATA:  Shortness of breath, left-sided rib pain since a couple days ago. Bilateral lower extremity swelling. EXAM: CT CHEST, ABDOMEN, AND PELVIS WITH CONTRAST CT lumbar spine with contrast TECHNIQUE: Multidetector CT imaging of the chest, abdomen and pelvis was performed following the standard protocol during bolus administration of intravenous contrast. Multiplanar CT images of the lumbar spine were reconstructed from contemporary CT of the Chest, Abdomen, and Pelvis. RADIATION DOSE REDUCTION: This exam  was performed according to the departmental dose-optimization program which includes automated exposure control, adjustment of the mA and/or kV according to patient size and/or use of iterative reconstruction technique. CONTRAST:  OMNIPAQUE IOHEXOL 300 MG/ML  SOLN COMPARISON:  CT 01/29/2024 FINDINGS: CT CHEST FINDINGS Cardiovascular: No pericardial effusion. TAVR. Coronary artery and aortic atherosclerotic calcification Mediastinum/Nodes: Trachea and esophagus are unremarkable. No thoracic adenopathy. Lungs/Pleura: Small right and trace left pleural effusions. Bronchial wall thickening greatest in the lower lungs. There are scattered patchy ground-glass opacities in the lower lobes. More dense consolidation in the right middle lobe. No pneumothorax. Musculoskeletal: Acute nondisplaced fracture of the left lateral seventh rib. Similar subacute-chronic fractures of the left posterolateral eighth and ninth ribs. Posterior fusion T10-T11. CT ABDOMEN PELVIS FINDINGS Hepatobiliary: No acute abnormality. Pancreas: Fatty atrophy.  No acute abnormality. Spleen: Unremarkable. Adrenals/Urinary Tract: Normal adrenal glands. Nonobstructing punctate right nephrolithiasis. No obstructing ureteral calculi or hydronephrosis. Posterior and left-sided bladder wall diverticula. Stomach/Bowel: Normal caliber large and small bowel. No bowel wall thickening. Stomach and appendix are within normal limits. Vascular/Lymphatic: Aortic atherosclerosis. No enlarged abdominal or pelvic lymph nodes. Reproductive: Unremarkable. Other: No free intraperitoneal fluid or air. Fat containing ventral abdominal wall hernias. Musculoskeletal: No acute fracture CT LUMBAR SPINE FINDINGS Segmentation: 5 lumbar type vertebrae. Alignment: No evidence of traumatic listhesis. Vertebrae: No acute fracture. Paraspinal and other soft tissues: See above. Disc levels: Multilevel spondylosis with bulky anterior osteophytes, disc space height loss with vacuum  phenomenon, and degenerative endplate changes. Degenerative endplate changes are greatest at L2-L3. Advanced facet arthropathy at L4-L5 and L5-S1. No severe spinal canal narrowing. IMPRESSION: 1. Acute nondisplaced fracture of the left lateral seventh rib. No pneumothorax. 2. Small right and trace left pleural effusions. 3. Scattered patchy ground-glass opacities in the lower lobes with more dense consolidation in the right middle lobe. Findings are favored to represent infection or aspiration. 4. No acute traumatic injury in the abdomen or pelvis. 5. No acute fracture or traumatic listhesis in the lumbar spine. 6. Aortic Atherosclerosis (ICD10-I70.0). Electronically Signed   By: Minerva Fester M.D.   On: 03/15/2024 19:11   CT L-SPINE NO CHARGE Result Date: 03/15/2024 CLINICAL DATA:  Shortness of breath, left-sided rib pain since a couple days ago. Bilateral lower extremity swelling. EXAM: CT CHEST, ABDOMEN, AND PELVIS WITH CONTRAST CT lumbar spine with contrast TECHNIQUE: Multidetector CT imaging of the chest, abdomen and pelvis was performed following the standard protocol during bolus administration of intravenous contrast. Multiplanar CT images of the lumbar spine were reconstructed from contemporary CT of the Chest, Abdomen, and Pelvis. RADIATION DOSE REDUCTION: This exam was performed according to the departmental dose-optimization program which includes automated exposure control, adjustment of the mA and/or kV according to patient size  and/or use of iterative reconstruction technique. CONTRAST:  OMNIPAQUE IOHEXOL 300 MG/ML  SOLN COMPARISON:  CT 01/29/2024 FINDINGS: CT CHEST FINDINGS Cardiovascular: No pericardial effusion. TAVR. Coronary artery and aortic atherosclerotic calcification Mediastinum/Nodes: Trachea and esophagus are unremarkable. No thoracic adenopathy. Lungs/Pleura: Small right and trace left pleural effusions. Bronchial wall thickening greatest in the lower lungs. There are scattered  patchy ground-glass opacities in the lower lobes. More dense consolidation in the right middle lobe. No pneumothorax. Musculoskeletal: Acute nondisplaced fracture of the left lateral seventh rib. Similar subacute-chronic fractures of the left posterolateral eighth and ninth ribs. Posterior fusion T10-T11. CT ABDOMEN PELVIS FINDINGS Hepatobiliary: No acute abnormality. Pancreas: Fatty atrophy.  No acute abnormality. Spleen: Unremarkable. Adrenals/Urinary Tract: Normal adrenal glands. Nonobstructing punctate right nephrolithiasis. No obstructing ureteral calculi or hydronephrosis. Posterior and left-sided bladder wall diverticula. Stomach/Bowel: Normal caliber large and small bowel. No bowel wall thickening. Stomach and appendix are within normal limits. Vascular/Lymphatic: Aortic atherosclerosis. No enlarged abdominal or pelvic lymph nodes. Reproductive: Unremarkable. Other: No free intraperitoneal fluid or air. Fat containing ventral abdominal wall hernias. Musculoskeletal: No acute fracture CT LUMBAR SPINE FINDINGS Segmentation: 5 lumbar type vertebrae. Alignment: No evidence of traumatic listhesis. Vertebrae: No acute fracture. Paraspinal and other soft tissues: See above. Disc levels: Multilevel spondylosis with bulky anterior osteophytes, disc space height loss with vacuum phenomenon, and degenerative endplate changes. Degenerative endplate changes are greatest at L2-L3. Advanced facet arthropathy at L4-L5 and L5-S1. No severe spinal canal narrowing. IMPRESSION: 1. Acute nondisplaced fracture of the left lateral seventh rib. No pneumothorax. 2. Small right and trace left pleural effusions. 3. Scattered patchy ground-glass opacities in the lower lobes with more dense consolidation in the right middle lobe. Findings are favored to represent infection or aspiration. 4. No acute traumatic injury in the abdomen or pelvis. 5. No acute fracture or traumatic listhesis in the lumbar spine. 6. Aortic Atherosclerosis  (ICD10-I70.0). Electronically Signed   By: Minerva Fester M.D.   On: 03/15/2024 19:11   CT Cervical Spine Wo Contrast Result Date: 03/15/2024 CLINICAL DATA:  Neck trauma (Age >= 65y) EXAM: CT CERVICAL SPINE WITHOUT CONTRAST TECHNIQUE: Multidetector CT imaging of the cervical spine was performed without intravenous contrast. Multiplanar CT image reconstructions were also generated. RADIATION DOSE REDUCTION: This exam was performed according to the departmental dose-optimization program which includes automated exposure control, adjustment of the mA and/or kV according to patient size and/or use of iterative reconstruction technique. COMPARISON:  None Available. FINDINGS: Alignment: 9 mm of degenerative anterolisthesis of C6 on C7. 4 mm of degenerative anterolisthesis of C7 on T1. Findings stable since prior study. Skull base and vertebrae: No acute fracture. No primary bone lesion or focal pathologic process. Soft tissues and spinal canal: No prevertebral fluid or swelling. No visible canal hematoma. Disc levels: ACDF at C6-7. Posterior fusion with posterior spinal rods extending from the occipital region into the upper thoracic spine. Fusion across the facet joints diffusely. Advanced diffuse degenerative disc disease. Upper chest: Bilateral pleural effusions partially visualized. Other: None IMPRESSION: Diffuse postsurgical and degenerative changes throughout the cervical spine. No acute bony abnormality. Bilateral pleural effusions partially visualized Electronically Signed   By: Charlett Nose M.D.   On: 03/15/2024 19:02   CT Head Wo Contrast Result Date: 03/15/2024 CLINICAL DATA:  Head trauma, moderate-severe EXAM: CT HEAD WITHOUT CONTRAST TECHNIQUE: Contiguous axial images were obtained from the base of the skull through the vertex without intravenous contrast. RADIATION DOSE REDUCTION: This exam was performed according to the departmental dose-optimization  program which includes automated exposure  control, adjustment of the mA and/or kV according to patient size and/or use of iterative reconstruction technique. COMPARISON:  None Available. FINDINGS: Brain: There is atrophy and chronic small vessel disease changes. No acute intracranial abnormality. Specifically, no hemorrhage, hydrocephalus, mass lesion, acute infarction, or significant intracranial injury. Vascular: No hyperdense vessel or unexpected calcification. Skull: No acute calvarial abnormality. Sinuses/Orbits: No acute findings Other: None IMPRESSION: Atrophy, chronic microvascular disease. No acute intracranial abnormality. Electronically Signed   By: Charlett Nose M.D.   On: 03/15/2024 18:53    Microbiology: Recent Results (from the past 240 hours)  SARS Coronavirus 2 by RT PCR (hospital order, performed in [facility] hospital lab) *cepheid single result test* Anterior Nasal Swab     Status: None   Collection Time: 03/16/24  7:01 AM   Specimen: Anterior Nasal Swab  Result Value Ref Range Status   SARS Coronavirus 2 by RT PCR NEGATIVE NEGATIVE Final    Comment: Performed at [facility], [address], [place], [place] [address]  Respiratory (~20 pathogens) panel by PCR     Status: None   Collection Time: 03/16/24  7:01 AM   Specimen: Nasopharyngeal Swab; Respiratory  Result Value Ref Range Status   Adenovirus NOT DETECTED NOT DETECTED Final   Coronavirus 229E NOT DETECTED NOT DETECTED Final    Comment: (NOTE) The Coronavirus on the Respiratory Panel, DOES NOT test for the novel  Coronavirus (2019 nCoV)    Coronavirus HKU1 NOT DETECTED NOT DETECTED Final   Coronavirus NL63 NOT DETECTED NOT DETECTED Final   Coronavirus OC43 NOT DETECTED NOT DETECTED Final   Metapneumovirus NOT DETECTED NOT DETECTED Final   Rhinovirus / Enterovirus NOT DETECTED NOT DETECTED Final   Influenza A NOT DETECTED NOT DETECTED Final   Influenza B NOT DETECTED NOT DETECTED Final   Parainfluenza Virus 1 NOT DETECTED NOT DETECTED Final    Parainfluenza Virus 2 NOT DETECTED NOT DETECTED Final   Parainfluenza Virus 3 NOT DETECTED NOT DETECTED Final   Parainfluenza Virus 4 NOT DETECTED NOT DETECTED Final   Respiratory Syncytial Virus NOT DETECTED NOT DETECTED Final   Bordetella pertussis NOT DETECTED NOT DETECTED Final   Bordetella Parapertussis NOT DETECTED NOT DETECTED Final   Chlamydophila pneumoniae NOT DETECTED NOT DETECTED Final   Mycoplasma pneumoniae NOT DETECTED NOT DETECTED Final    Comment: Performed at [facility], [address], [place], [place] [address]     Labs: Basic Metabolic Panel: Recent Labs  Lab 03/15/24 1649 03/16/24 0903 03/17/24 0354 03/18/24 0243 03/19/24 0713  NA 135 137 137 139 136  K 4.2 4.0 3.6 4.9 4.2  CL 101 104 98 97* 98  CO2 28 26 30  35* 28  GLUCOSE 101* 106* 92 93 90  BUN 22 15 13 17 21   CREATININE 1.15 1.11 1.04 1.12 1.14  CALCIUM 8.8* 9.1 9.1 9.2 9.1  MG  --   --  1.9  --   --    Liver Function Tests: Recent Labs  Lab 03/15/24 1649 03/17/24 0354 03/18/24 0243  AST 26 22 28   ALT 19 20 20   ALKPHOS 73 65 60  BILITOT 0.5 0.7 0.8  PROT 6.9 6.2* 6.1*  ALBUMIN 3.5 2.8* 2.8*   No results for input(s): "LIPASE", "AMYLASE" in the last 168 hours. No results for input(s): "AMMONIA" in the last 168 hours. CBC: Recent Labs  Lab 03/15/24 1636 03/16/24 0903 03/17/24 0354 03/18/24 0243  WBC 6.3 5.9 6.4  7.5  HGB 10.8* 11.4* 12.0* 12.0*  HCT 33.9* 34.5* 35.6* 37.1*  MCV 100.0 98.0 97.3 97.1  PLT 194 219 244 260   Cardiac Enzymes: No results for input(s): "CKTOTAL", "CKMB", "CKMBINDEX", "TROPONINI" in the last 168 hours. BNP: BNP (last 3 results) Recent Labs    04/29/23 1511 03/15/24 1636  BNP 190.3* 578.5*    ProBNP (last 3 results) No results for input(s): "PROBNP" in the last 8760 hours.  CBG: No results for input(s): "GLUCAP" in the last 168 hours.     Signed:  Zannie Cove MD.  [facility] 03/19/2024, 9:51 AM

## 2024-03-19 NOTE — Plan of Care (Signed)
  Problem: Elimination: Goal: Will not experience complications related to bowel motility Outcome: Progressing   Problem: Pain Managment: Goal: General experience of comfort will improve and/or be controlled Outcome: Progressing: adding more analgesics to pt pain regimen to better meet goal

## 2024-03-19 NOTE — Progress Notes (Signed)
 Reviewed AVS, patient expressed understanding of medications, MD follow up reviewed.   Removed IV, Site clean, dry and intact.  Patient states all belongings brought to the hospital at time of admission are accounted for and packed to take home.  Picked up medications from [facility] pharmacy. Pt transported to Discharge lounge to wait for transportation home.

## 2024-03-19 NOTE — Progress Notes (Addendum)
 Patient Name: [name] Date of Encounter: 03/19/2024 [facility] Cardiologist: [facility].  Interval Summary  .    With OT yesterday had low saturations 84 to 87% on room air.  However he denies any significant shortness of breath.  Still feels good with no complaints.  Vital Signs .    Vitals:   03/19/24 0021 03/19/24 0022 03/19/24 0513 03/19/24 0710  BP: (!) 98/51 107/64 105/69 112/67  Pulse:  86 83 89  Resp:  18 18 16   Temp: 97.6 F (36.4 C) (!) 97.4 F (36.3 C) 97.6 F (36.4 C) 98 F (36.7 C)  TempSrc: Oral Oral Oral Oral  SpO2:  100% 99% 100%  Weight:   99.4 kg     Intake/Output Summary (Last 24 hours) at 03/19/2024 0801 Last data filed at 03/19/2024 0517 Gross per 24 hour  Intake 237 ml  Output 1150 ml  Net -913 ml      03/19/2024    5:13 AM 03/18/2024    5:20 AM 03/17/2024    4:52 AM  Last 3 Weights  Weight (lbs) 219 lb 2.2 oz 220 lb 7.4 oz 223 lb 15.8 oz  Weight (kg) 99.4 kg 100 kg 101.6 kg      Telemetry/ECG    Atrial fibrillation heart rates 80s to 90s- Personally Reviewed  CV Studies    Echocardiogram 01/30/2024  1. Left ventricular ejection fraction, by estimation, is 65 to 70%. The  left ventricle has normal function. The left ventricle has no regional  wall motion abnormalities. There is mild concentric left ventricular  hypertrophy. Left ventricular diastolic  function could not be evaluated.   2. Right ventricular systolic function is normal. The right ventricular  size is normal. There is normal pulmonary artery systolic pressure.   3. Left atrial size was severely dilated.   4. Right atrial size was severely dilated.   5. The mitral valve is normal in structure. No evidence of mitral valve  regurgitation. No evidence of mitral stenosis.   6. The aortic valve has been repaired/replaced. Aortic valve  regurgitation is not visualized. No aortic stenosis is present. There is a  34 CoreValve-EvolutR prosthetic (TAVR)  valve present in the aortic  position. Procedure Date: 05/2018. Echo findings  are consistent with normal structure and function of the aortic valve  prosthesis. Aortic valve area, by VTI measures 1.69 cm. Aortic valve mean  gradient measures 4.6 mmHg. Aortic valve Vmax measures 1.46 m/s.   7. The inferior vena cava is normal in size with greater than 50%  respiratory variability, suggesting right atrial pressure of 3 mmHg.    Cardiac cath 03/2018 (care everywhere)  patent coronary arteries with very mild, luminal irregularities in the LAD and left Cx  Physical Exam .   GEN: No acute distress.   Neck: No JVD Cardiac: Irregularly irregular Respiratory: Slight crackles left lower lobe GI: Soft, nontender, non-distended  MS: No edema  Patient Profile    [name] is a 83 y.o. male has hx of   permanent atrial fibrillation, severe AS status post TAVR 05/2018, PFO, hypertension, hyperlipidemia, CVA.  Admitted for HFpEF exacerbation.  Had not been taking his Lasix.  Assessment & Plan .     Acute on chronic HFpEF Reportedly off his lasix since previous admission. No respiratory complaints, had been having increased peripheral edema for last 2 weeks.  He is down 20 pounds since admission.  From a volume standpoint he continues to  look euvolemic (weight is stable) and with no symptoms.  Looks to be at his baseline.  Suspect that his hypoxia is more related to his pneumonia rather than volume.  We will check a BNP though.  Continue with Lasix 40 mg daily, Jardiance 10 mg, spironolactone 12.5 mg.   [facility] in [facility] 2 weeks to get labs done. Discussed ongoing management and need for daily weights and monitoring of peripheral edema. Dry weight is around 220 pounds   Permanent atrial fibrillation Rate controlled heart rates generally in the 70s-90.  Severely dilated biatria.  No plans of cardioversion.  Currently rate control strategy. Continue with Eliquis 5 mg  twice daily, diltiazem 120 mg, Toprol-XL 25 mg   Status post TAVR 2019 Echocardiogram in 2025 showing well-seated valve, normal device function.  Mean gradient 4.6.  Aortic atherosclerosis Hyperlipidemia Noted on CT imaging.  Previous cardiac catheterization 2019 without any significant disease. Continue atorvastatin 20 mg.   Possible aspiration pneumonia Fall with nondisplaced fracture Anemia Per primary team.  Awaiting labs today.  Make sure potassium is okay.  For questions or updates, please contact [facility] Please consult [email or web address] for contact info under        Signed, Abagail Kitchens, PA-C    I have personally seen and examined the patient.  My HPI, Exam, and assessment and plan are below, independent of the NPP above.  Re-weaned of O2. Feels better.  I cancelled BNP after talking with him He has a lot of new information.   Exam notable for  Gen: no distress   Neck: No JVD Cardiac: No Rubs or Gallops, no Murmur, IRIR GI: Soft, nontender, non-distended  MS: No edema Psych: Normal affect, patient feels great  Tele: IRIR  AP - Notably diuresed.  On good new, GDMT. WE have given him information on HF and he feels more comfortable on the nutrition side - continue HLD secondary medication - TAVR is well seated; needs repeat echo in one year  - no issues with [facility] - if he would like to see me in conjunction with the [facility]; I am happy to assist  Riley Lam, MD FASE [facility] Cardiologist [facility]  [address], [address] [place], [place] [address] [phone]  8:45 AM

## 2024-03-19 NOTE — Progress Notes (Signed)
 Patients sister Steward Drone is Health care power of Attorney; not legal guardian.

## 2024-03-19 NOTE — Progress Notes (Signed)
 Heart Failure Navigator Progress Note  Assessed for Heart & Vascular TOC clinic readiness.  Patient does not meet criteria due to EF 65-70%, Patient see's [facility] thru the [place]. Marland Kitchen   Navigator will sign off at this time.   Rhae Hammock, BSN, Scientist, clinical (histocompatibility and immunogenetics) Only

## 2024-03-19 NOTE — TOC Transition Note (Signed)
 Transition of Care [facility]) - Discharge Note   Patient Details  Name: [name] MRN: [id number] Date of Birth: 1941-10-07  Transition of Care [facility]) CM/SW Contact:  Leone Haven, RN Phone Number: 03/19/2024, 8:54 AM   Clinical Narrative:    For dc today, NCM notified Cory with [facility], left message for Steward Drone, to return call.     Final next level of care: Home w Home Health Services Barriers to Discharge: Continued Medical Work up   Patient Goals and CMS Choice Patient states their goals for this hospitalization and ongoing recovery are:: return home CMS Medicare.gov Compare Post Acute Care list provided to:: Patient Choice offered to / list presented to : Patient      Discharge Placement                       Discharge Plan and Services Additional resources added to the After Visit Summary for     Discharge Planning Services: CM Consult Post Acute Care Choice: Home Health          DME Arranged: N/A DME Agency: NA, Adult and Pediatric Services       HH Arranged: RN, PT, OT HH Agency: [facility] Date [facility] Agency Contacted: 03/16/24 Time HH Agency Contacted: 1306 Representative spoke with at [facility] Agency: Kandee Keen  Social Drivers of Health (SDOH) Interventions SDOH Screenings   Food Insecurity: No Food Insecurity (01/30/2024)  Housing: Low Risk  (01/30/2024)  Transportation Needs: No Transportation Needs (01/30/2024)  Utilities: Not At Risk (01/30/2024)  Social Connections: Patient Declined (03/16/2024)  Tobacco Use: Medium Risk (03/15/2024)     Readmission Risk Interventions    03/16/2024   12:59 PM 01/31/2024   11:58 AM 10/27/2023   11:13 AM  Readmission Risk Prevention Plan  Transportation Screening Complete Complete Complete  PCP or Specialist Appt within 5-7 Days Complete Complete Complete  Home Care Screening Complete Complete Complete  Medication Review (RN CM) Complete Complete Complete

## 2024-04-24 ENCOUNTER — Emergency Department (HOSPITAL_COMMUNITY)

## 2024-04-24 ENCOUNTER — Encounter (HOSPITAL_COMMUNITY): Payer: Self-pay

## 2024-04-24 ENCOUNTER — Other Ambulatory Visit: Payer: Self-pay

## 2024-04-24 ENCOUNTER — Emergency Department (HOSPITAL_COMMUNITY)
Admission: EM | Admit: 2024-04-24 | Discharge: 2024-04-25 | Disposition: A | Discharge status: Home / Self Care | Attending: Emergency Medicine | Admitting: Emergency Medicine

## 2024-04-24 DIAGNOSIS — I509 Heart failure, unspecified: Secondary | ICD-10-CM | POA: Insufficient documentation

## 2024-04-24 DIAGNOSIS — R600 Localized edema: Secondary | ICD-10-CM | POA: Diagnosis not present

## 2024-04-24 DIAGNOSIS — I959 Hypotension, unspecified: Secondary | ICD-10-CM | POA: Insufficient documentation

## 2024-04-24 DIAGNOSIS — M7989 Other specified soft tissue disorders: Secondary | ICD-10-CM | POA: Diagnosis not present

## 2024-04-24 DIAGNOSIS — Z7901 Long term (current) use of anticoagulants: Secondary | ICD-10-CM | POA: Insufficient documentation

## 2024-04-24 DIAGNOSIS — Z7982 Long term (current) use of aspirin: Secondary | ICD-10-CM | POA: Insufficient documentation

## 2024-04-24 DIAGNOSIS — R0602 Shortness of breath: Secondary | ICD-10-CM | POA: Diagnosis present

## 2024-04-24 DIAGNOSIS — F17200 Nicotine dependence, unspecified, uncomplicated: Secondary | ICD-10-CM | POA: Insufficient documentation

## 2024-04-24 LAB — COMPREHENSIVE METABOLIC PANEL WITH GFR
ALT: 16 U/L (ref 0–44)
AST: 21 U/L (ref 15–41)
Albumin: 3.3 g/dL — ABNORMAL LOW (ref 3.5–5.0)
Alkaline Phosphatase: 72 U/L (ref 38–126)
Anion gap: 9 (ref 5–15)
BUN: 27 mg/dL — ABNORMAL HIGH (ref 8–23)
CO2: 26 mmol/L (ref 22–32)
Calcium: 8.7 mg/dL — ABNORMAL LOW (ref 8.9–10.3)
Chloride: 102 mmol/L (ref 98–111)
Creatinine, Ser: 1.1 mg/dL (ref 0.61–1.24)
GFR, Estimated: >60 mL/min (ref >60)
Glucose, Bld: 82 mg/dL (ref 70–99)
Potassium: 4.3 mmol/L (ref 3.5–5.1)
Sodium: 137 mmol/L (ref 135–145)
Total Bilirubin: 0.4 mg/dL (ref 0.0–1.2)
Total Protein: 6.7 g/dL (ref 6.5–8.1)

## 2024-04-24 LAB — CBC WITH DIFFERENTIAL/PLATELET
Abs Immature Granulocytes: 0.01 10*3/uL (ref 0.00–0.07)
Basophils Absolute: 0 10*3/uL (ref 0.0–0.1)
Basophils Relative: 0 %
Eosinophils Absolute: 0.3 10*3/uL (ref 0.0–0.5)
Eosinophils Relative: 6 %
HCT: 35.3 % — ABNORMAL LOW (ref 39.0–52.0)
Hemoglobin: 11.4 g/dL — ABNORMAL LOW (ref 13.0–17.0)
Immature Granulocytes: 0 %
Lymphocytes Relative: 27 %
Lymphs Abs: 1.4 10*3/uL (ref 0.7–4.0)
MCH: 31.8 pg (ref 26.0–34.0)
MCHC: 32.3 g/dL (ref 30.0–36.0)
MCV: 98.3 fL (ref 80.0–100.0)
Monocytes Absolute: 0.8 10*3/uL (ref 0.1–1.0)
Monocytes Relative: 16 %
Neutro Abs: 2.5 10*3/uL (ref 1.7–7.7)
Neutrophils Relative %: 51 %
Platelets: 220 10*3/uL (ref 150–400)
RBC: 3.59 MIL/uL — ABNORMAL LOW (ref 4.22–5.81)
RDW: 14.8 % (ref 11.5–15.5)
WBC: 5 10*3/uL (ref 4.0–10.5)
nRBC: 0 % (ref 0.0–0.2)

## 2024-04-24 LAB — TROPONIN I (HIGH SENSITIVITY): Troponin I (High Sensitivity): 5 ng/L (ref <18)

## 2024-04-24 LAB — BRAIN NATRIURETIC PEPTIDE: B Natriuretic Peptide: 590.4 pg/mL — ABNORMAL HIGH (ref 0.0–100.0)

## 2024-04-24 MED ORDER — OXYCODONE-ACETAMINOPHEN 5-325 MG PO TABS
1.0000 | ORAL_TABLET | Freq: Once | ORAL | Status: AC
  Administered 2024-04-24: 1 via ORAL
  Filled 2024-04-24: qty 1

## 2024-04-24 MED ORDER — ROPINIROLE HCL 0.25 MG PO TABS
0.2500 mg | ORAL_TABLET | Freq: Once | ORAL | Status: AC
  Administered 2024-04-24: 0.25 mg via ORAL
  Filled 2024-04-24: qty 1

## 2024-04-24 NOTE — ED Notes (Signed)
 Awaiting call back from sister Cornelius Dill "legal guardian" per patient.

## 2024-04-24 NOTE — Discharge Instructions (Addendum)
 Follow-up with your cardiologist in the next week for reevaluation of your lower extremity swelling and your low blood pressure readings.  Obtain imaging are otherwise reassuring.  Get help right away if: You have shortness of breath or chest pain. You cannot breathe when you lie down. You have pain, redness, or warmth in the swollen areas. You have heart, liver, or kidney disease and get edema all of a sudden. You have a fever and your symptoms get worse all of a sudden.

## 2024-04-24 NOTE — ED Notes (Signed)
 Pt ambulated with pulse ox applied. Pt O2 sats on room air while walking around the triage area was 94-96%

## 2024-04-24 NOTE — ED Provider Notes (Signed)
 [place] EMERGENCY DEPARTMENT AT [facility] Provider Note   CSN: [id number] Arrival date & time: 04/24/24  1550     History  Chief Complaint  Patient presents with   Hypotension   Abdominal Pain   Extremity Pain    [name] is a 83 y.o. male with a history of CHF, atrial fibrillation with RVR, and CVA presents the ED today for leg swelling.  Patient reports that he was at his pain clinic earlier today when his blood pressure was 80/50.  Also was having feeling of "tightness" in his abdomen and lower extremity swelling bilaterally.  Denies any abdominal pain at the time of my evaluation.  Reports taking his Lasix  daily for CHF.  Does feel some shortness of breath with leaning forward but feels better with sitting up.  Denies any chest pain, weakness, or lightheadedness. No additional complaints or concerns at this time.    Home Medications Prior to Admission medications   Medication Sig Start Date End Date Taking? Authorizing Provider  apixaban  (ELIQUIS ) 5 MG TABS tablet Take 5 mg by mouth 2 (two) times daily.    [provider]  aspirin  EC 81 MG tablet Take 81 mg by mouth daily.    [provider]  buPROPion  (WELLBUTRIN ) 100 MG tablet Take 100 mg by mouth 2 (two) times daily.    [provider]  diltiazem  (CARDIZEM  CD) 120 MG 24 hr capsule Take 1 capsule (120 mg total) by mouth daily. 03/19/24   Deforest Fast, MD  empagliflozin  (JARDIANCE ) 10 MG TABS tablet Take 1 tablet (10 mg total) by mouth daily. 03/19/24   Deforest Fast, MD  ferrous sulfate  325 (65 FE) MG tablet Take 325 mg by mouth daily with supper. 03/15/20   [provider]  fluticasone  (FLONASE ) 50 MCG/ACT nasal spray Place 1 spray into both nostrils daily as needed for allergies or rhinitis. 01/24/24   [provider]  furosemide  (LASIX ) 40 MG tablet Take 1 tablet (40 mg total) by mouth daily. 03/19/24   Deforest Fast, MD  gabapentin  (NEURONTIN ) 800 MG tablet Take  800 mg by mouth 3 (three) times daily. 01/02/24   [provider]  lidocaine  (LIDODERM ) 5 % Place 1 patch onto the skin daily as needed (pain). 01/18/24   [provider]  metoprolol  succinate (TOPROL -XL) 25 MG 24 hr tablet Take 25 mg by mouth daily.    [provider]  oxyCODONE -acetaminophen  (PERCOCET) 10-325 MG tablet Take 1 tablet by mouth 5 (five) times daily.    [provider]  potassium chloride  (K-DUR,KLOR-CON ) 20 MEQ tablet Take 1 tablet (20 mEq total) by mouth daily. Patient taking differently: Take 20 mEq by mouth at bedtime. 09/20/18   Strader, Dimple Francis, PA-C  rOPINIRole  (REQUIP ) 0.25 MG tablet Take 0.25 mg by mouth 2 (two) times daily.  10/09/18   [provider]  silver sulfADIAZINE (SILVADENE) 1 % cream Apply 1 Application topically daily as needed (leg sores, wound healing). 11/25/23   [provider]  simvastatin  (ZOCOR ) 40 MG tablet Take 40 mg by mouth at bedtime.    [provider]  spironolactone  (ALDACTONE ) 25 MG tablet Take 0.5 tablets (12.5 mg total) by mouth daily. 03/19/24   Deforest Fast, MD  tamsulosin  (FLOMAX ) 0.4 MG CAPS capsule Take 0.8 mg by mouth 2 (two) times daily. 09/02/19   [provider]  topiramate  (TOPAMAX ) 25 MG capsule Take 25 mg by mouth at bedtime.    [provider]  Allergies    Ativan  [lorazepam ], Lyrica [pregabalin], Zanaflex [tizanidine hcl], and Cymbalta [duloxetine hcl]    Review of Systems   Review of Systems  Cardiovascular:  Positive for leg swelling.  Gastrointestinal:  Positive for abdominal pain.  All other systems reviewed and are negative.   Physical Exam Updated Vital Signs BP 95/70   Pulse 85   Temp (!) 97.5 F (36.4 C) (Oral)   Resp 16   Ht 5\' 9"  (1.753 m)   Wt 99.4 kg   SpO2 94%   BMI 32.36 kg/m  Physical Exam Vitals and nursing note reviewed.  Constitutional:      General: He is not in acute distress.    Appearance: Normal  appearance.  HENT:     Head: Normocephalic and atraumatic.     Mouth/Throat:     Mouth: Mucous membranes are moist.  Eyes:     Conjunctiva/sclera: Conjunctivae normal.     Pupils: Pupils are equal, round, and reactive to light.  Cardiovascular:     Rate and Rhythm: Normal rate and regular rhythm.     Pulses: Normal pulses.     Heart sounds: Normal heart sounds.  Pulmonary:     Effort: Pulmonary effort is normal.     Breath sounds: Normal breath sounds.  Abdominal:     Palpations: Abdomen is soft.     Tenderness: There is no abdominal tenderness.  Musculoskeletal:     Right lower leg: Edema present.     Left lower leg: Edema present.  Skin:    General: Skin is warm and dry.     Findings: No rash.  Neurological:     General: No focal deficit present.     Mental Status: He is alert.  Psychiatric:        Mood and Affect: Mood normal.        Behavior: Behavior normal.    ED Results / Procedures / Treatments   Labs (all labs ordered are listed, but only abnormal results are displayed) Labs Reviewed  CBC WITH DIFFERENTIAL/PLATELET - Abnormal; Notable for the following components:      Result Value   RBC 3.59 (*)    Hemoglobin 11.4 (*)    HCT 35.3 (*)    All other components within normal limits  BRAIN NATRIURETIC PEPTIDE - Abnormal; Notable for the following components:   B Natriuretic Peptide 590.4 (*)    All other components within normal limits  COMPREHENSIVE METABOLIC PANEL WITH GFR - Abnormal; Notable for the following components:   BUN 27 (*)    Calcium  8.7 (*)    Albumin 3.3 (*)    All other components within normal limits  TROPONIN I (HIGH SENSITIVITY)    EKG EKG Interpretation Date/Time:  Friday Apr 24 2024 16:07:19 EDT Ventricular Rate:  76 PR Interval:    QRS Duration:  91 QT Interval:  382 QTC Calculation: 427 R Axis:   102  Text Interpretation: Atrial fibrillation Anterior infarct, old No significant change since last tracing Confirmed by  Zackowski, Scott [phone]) on 04/24/2024 4:09:42 PM  Radiology DG Chest Port 1 View Result Date: 04/24/2024 CLINICAL DATA:  Hypotension with tightness and stomach and bilateral lower extremities. EXAM: PORTABLE CHEST 1 VIEW COMPARISON:  January 29, 2024 FINDINGS: The heart size and mediastinal contours are within normal limits. An artificial aortic valve is seen. There is no evidence of acute infiltrate, pleural effusion or pneumothorax. Postoperative changes are seen within the cervical spine and lower thoracic spine. Multilevel degenerative changes  are present throughout the thoracic spine. IMPRESSION: No active cardiopulmonary disease. Electronically Signed   By: Virgle Grime M.D.   On: 04/24/2024 21:19    Procedures Procedures    Medications Ordered in ED Medications  oxyCODONE -acetaminophen  (PERCOCET/ROXICET) 5-325 MG per tablet 1 tablet (1 tablet Oral Given 04/24/24 2143)  rOPINIRole  (REQUIP ) tablet 0.25 mg (0.25 mg Oral Given by Other 04/24/24 2142)    ED Course/ Medical Decision Making/ A&P                                 Medical Decision Making Amount and/or Complexity of Data Reviewed Labs: ordered. Radiology: ordered.  Risk Prescription drug management.   This patient presents to the ED for concern of leg swelling and hypotension, this involves an extensive number of treatment options, and is a complaint that carries with it a high risk of complications and morbidity.   Differential diagnosis includes: CHF exacerbation, dehydration, etc.   Comorbidities  See HPI above   Additional History  Additional history obtained from prior records   Cardiac Monitoring / EKG  The patient was maintained on a cardiac monitor.  I personally viewed and interpreted the cardiac monitored which showed: atrial fibrillation with a heart rate of 76 bpm.   Lab Tests  I ordered and personally interpreted labs.  The pertinent results include:   BNP of 590.4 BUN of 27 on CMP  otherwise CMP and CBC are reassuring Troponin is within normal limits   Imaging Studies  I ordered imaging studies including CXR  I independently visualized and interpreted imaging which showed:  No acute cardiopulmonary disease I agree with the radiologist interpretation   Problem List / ED Course / Critical Interventions / Medication Management  Patient was seen in his provider's office and had a low blood pressure of 80/50.  Sent here for further evaluation. Patient seems to have hypotension at baseline.  Previous cardiology notes have patient's blood pressure in the 90s.  No weakness or lightheadedness. An episode of shortness of breath leaning forward earlier today otherwise he can speak in full sentences without any acute distress.  No chest pain.  Does have bilateral lower extremity edema.  Is on Lasix  40 mg daily. Oxygen saturation of 94 to 96% on ambulation.  No acute distress. I ordered medications including: Percocet for chronic back pain  Oral hydration for low blood pressure readings. Reevaluation of the patient after these medicines showed that the patient improved I have reviewed the patients home medicines and have made adjustments as needed   Social Determinants of Health  Tobacco use   Test / Admission - Considered  Stable and safe for discharge home. Return precautions given.       Final Clinical Impression(s) / ED Diagnoses Final diagnoses:  Lower extremity edema  Hypotension, unspecified hypotension type    Rx / DC Orders ED Discharge Orders     None         Sonnie Dusky, PA-C 04/24/24 2225    Arvilla Birmingham, MD 04/24/24 2248

## 2024-04-24 NOTE — ED Triage Notes (Signed)
 Arrived by EMS for hypotension. Patient was picked up from [facility] medical, PCP office for tightness in stomach and ble lower extremities. Hx of Afib and CHF. BP 80/50 on EMS arrival.  EMS placed 20G right wrist

## 2024-05-05 ENCOUNTER — Ambulatory Visit (INDEPENDENT_AMBULATORY_CARE_PROVIDER_SITE_OTHER): Admitting: Primary Care

## 2024-05-05 ENCOUNTER — Encounter: Payer: Self-pay | Admitting: Primary Care

## 2024-05-05 VITALS — BP 128/86 | HR 88 | Ht 69.0 in | Wt 233.0 lb

## 2024-05-05 DIAGNOSIS — K224 Dyskinesia of esophagus: Secondary | ICD-10-CM | POA: Diagnosis not present

## 2024-05-05 DIAGNOSIS — Z87891 Personal history of nicotine dependence: Secondary | ICD-10-CM

## 2024-05-05 MED ORDER — FUROSEMIDE 40 MG PO TABS: 40.0000 mg | ORAL_TABLET | Freq: Every day | ORAL | 0 refills | Status: DC

## 2024-05-05 MED ORDER — EMPAGLIFLOZIN 10 MG PO TABS: 10.0000 mg | ORAL_TABLET | Freq: Every day | ORAL | 0 refills | Status: DC

## 2024-05-05 NOTE — Progress Notes (Signed)
 @Patient  ID: [name], male    DOB: 12/19/1940, 83 y.o.   MRN: [id number]  No chief complaint on file.   Referring provider: Allene Ivan, MD  HPI: 83 year old gentleman, past medical history of atrial fibrillation, severe aortic stenosis status post TAVR in 2019, chronic pain syndrome, arthritis.    Patient was referred after having CT scan of the chest for pulmonary nodules. Patient had CT scan of the chest completed in May 2024.  This was completed during recent hospitalization in the emergency room.  Patient was found to have several inflammatory appearing nodules 1 with early mild cavitation concerning for pneumonia versus septic emboli in May 2024.    Previous [facility] pulmonary encounter:  OV 08/22/2023: Here today for follow-up after recent CT scan of the chest.Patient was seen initially for evaluation after repeat CT.  He is 83 years old with multiple medical comorbidities.  Chronic pain, former smoker.Patient had a August CT scan from 08/09/2023 completed that showed the previous patchy poorly marginated pulmonary nodules with groundglass halos had resolved.  Had numerous similar patchy small nodules that were present throughout the left lower lobe.  Suggestive of recurrent aspiration.When the patient was counseled on swallowing dysfunction he does state that sometimes food gets stuck when he swallows right at the entrance of his thorax.  Discussion: This is a 83 year old gentleman multiple medical comorbidities, chronic pain, former smoker had a CT scan from ER visit in May 2024.  Revealed to have multiple small pulmonary nodules had subsequent CT follow-up in August to 2024 that shows resolution of those however he does have a few more groundglass lesions within the left lower lobe.  Plan: Patient will need a repeat noncontrasted CT chest in 3 months. I have ordered a MBSS and swallow evaluation by SLP. Sent a message over to one of my GI colleague Debbi Failing to take a look at his CT  imaging because I feel as if potentially his cervical hardware from a previous spine surgery could be causing some of his difficulty with swallowing as it does appear to crowd the space of the esophagus.    05/05/2024- Interim hx  Discussed the use of AI scribe software for clinical note transcription with the patient, who gave verbal consent to proceed.  History of Present Illness   [name] is an 83 year old male with pulmonary nodules who presents for a one year checkup. He was a former patient of Dr. Thelda Finney for follow-up on pulmonary nodules.  His breathing has been stable over the past four to six weeks. He has had several emergency room visits, including a fall in December, atrial fibrillation and heart failure in February, and again in March for heart failure, atrial fibrillation, and respiratory failure. He was hospitalized in March and received antibiotics, which were later discontinued due to low suspicion of infection. No active respiratory symptoms currently. His most recent ER visit was on May 9th for leg swelling.  He is on multiple medications for heart conditions, including metoprolol , Cardizem  (diltiazem ), a blood thinner, Jardiance  and diuretics such as spironolactone  and Lasix . He takes Lasix  40 mg once daily and spironolactone  half a tablet daily. He manages his medications himself using a pill box.  No recent bronchitis, cough, mucus production, or wheezing. He is able to perform daily activities such as showering and bathing independently, but his sister assists with shopping.  He has been evaluated for potential aspiration due to cervical hardware from previous spine surgery. A swallow  study in September showed no visible aspiration but indicated esophageal dysmotility and reflux. He denies choking, food getting stuck, or heartburn.   Allergies  Allergen Reactions   Ativan  [Lorazepam ] Other (See Comments)    Delirium   Lyrica [Pregabalin] Swelling and Other (See  Comments)    Triggered PTSD Nightmares Aggression   Zanaflex [Tizanidine Hcl] Swelling and Anxiety   Cymbalta [Duloxetine Hcl] Other (See Comments)    Increased depression Irritability     Immunization History  Administered Date(s) Administered   DTaP 12/17/2008   Influenza, High Dose Seasonal PF 09/21/2013, 10/06/2015, 08/07/2019   Influenza, Seasonal, Injecte, Preservative Fre 09/21/2013   Influenza,inj,quad, With Preservative 09/16/2020   Influenza-Unspecified 11/16/2013, 12/13/2014, 11/27/2022   Moderna Covid-19 Fall Seasonal Vaccine 53yrs & older 12/11/2022   PFIZER(Purple Top)SARS-COV-2 Vaccination 02/09/2020, 03/01/2020, 10/07/2020   Pneumococcal Conjugate-13 04/01/2015   Pneumococcal Polysaccharide-23 05/21/2003, 07/18/2017   Pneumococcal-Unspecified 08/06/2011   Td 06/16/2006   Tdap 04/01/2015, 07/10/2019   Zoster Recombinant(Shingrix) 07/10/2019, 01/11/2023   Zoster, Live 06/18/2016    Past Medical History:  Diagnosis Date   Cancer (HCC)    Carpal tunnel syndrome    Cervical spondylosis with myelopathy    Chronic pain syndrome    Depression    Difficult intubation    needs smaller tube; awake oral fiberoptic scope 8.0 ETT 03/30/08   Dysphagia, pharyngeal phase    Hemorrhage of gastrointestinal tract, unspecified    History of cardiac cath    a. 03/2018 Cath: LAD and LCX with mild luminal irregularities.  No significant disease.   Iron deficiency anemia, unspecified    Lumbosacral spondylosis without myelopathy    Muscle weakness (generalized)    Other and unspecified disc disorder of cervical region    Other and unspecified hyperlipidemia    Patent foramen ovale    a. 05/2018 Echo: post-op TAVR--> + bublble study w/ L->R shunt.   Permanent atrial fibrillation (HCC)    a.  Diagnosed in the spring 2019.  Had rapid atrial fibrillation following TAVR and has been rate controlled with beta-blocker.  CHA2DS2VASc equals 7.  Supposed to be on Eliquis .   Primary  localized osteoarthrosis, lower leg    Primary localized osteoarthrosis, shoulder region    Reflux esophagitis    Severe aortic stenosis    a. mild-mod AS by 01/2016 TTE, restricted AV opening with mod AR 01/2016 TEE; b. 10/2017 Echo: EF 60-65%, sev Ca2+ AoV, mean grad ; c. 05/2018 s/p TAVR ([facility]); d. 06/2018 Echo: EF 55-60%, well-seated prosth AoV, peak velocity 156cm/s. AoV gradient .   Spinal stenosis, unspecified region other than cervical    Stroke (HCC)    01/2016   Syncope and collapse    Unspecified arthropathy, lower leg    Unspecified constipation    Unspecified essential hypertension    Unspecified glaucoma(365.9)     Tobacco History: Social History   Tobacco Use  Smoking Status Former   Current packs/day: 0.00   Types: Cigarettes   Quit date: 08/29/1985   Years since quitting: 38.7  Smokeless Tobacco Former   Quit date: 02/12/1985   Counseling given: Not Answered   Outpatient Medications Prior to Visit  Medication Sig Dispense Refill   apixaban  (ELIQUIS ) 5 MG TABS tablet Take 5 mg by mouth 2 (two) times daily.     aspirin  EC 81 MG tablet Take 81 mg by mouth daily.     buPROPion  (WELLBUTRIN ) 100 MG tablet Take 100 mg by mouth 2 (two) times daily.  diltiazem  (CARDIZEM  CD) 120 MG 24 hr capsule Take 1 capsule (120 mg total) by mouth daily. 30 capsule 1   empagliflozin  (JARDIANCE ) 10 MG TABS tablet Take 1 tablet (10 mg total) by mouth daily. 30 tablet 1   ferrous sulfate  325 (65 FE) MG tablet Take 325 mg by mouth daily with supper.     fluticasone  (FLONASE ) 50 MCG/ACT nasal spray Place 1 spray into both nostrils daily as needed for allergies or rhinitis.     furosemide  (LASIX ) 40 MG tablet Take 1 tablet (40 mg total) by mouth daily. 30 tablet 1   gabapentin  (NEURONTIN ) 800 MG tablet Take 800 mg by mouth 3 (three) times daily.     lidocaine  (LIDODERM ) 5 % Place 1 patch onto the skin daily as needed (pain).     metoprolol  succinate (TOPROL -XL) 25 MG 24 hr tablet  Take 25 mg by mouth daily.     oxyCODONE -acetaminophen  (PERCOCET) 10-325 MG tablet Take 1 tablet by mouth 5 (five) times daily.     potassium chloride  (K-DUR,KLOR-CON ) 20 MEQ tablet Take 1 tablet (20 mEq total) by mouth daily. (Patient taking differently: Take 20 mEq by mouth at bedtime.) 30 tablet 5   rOPINIRole  (REQUIP ) 0.25 MG tablet Take 0.25 mg by mouth 2 (two) times daily.   3   silver sulfADIAZINE (SILVADENE) 1 % cream Apply 1 Application topically daily as needed (leg sores, wound healing).     simvastatin  (ZOCOR ) 40 MG tablet Take 40 mg by mouth at bedtime.     spironolactone  (ALDACTONE ) 25 MG tablet Take 0.5 tablets (12.5 mg total) by mouth daily.     tamsulosin  (FLOMAX ) 0.4 MG CAPS capsule Take 0.8 mg by mouth 2 (two) times daily.     topiramate  (TOPAMAX ) 25 MG capsule Take 25 mg by mouth at bedtime.     No facility-administered medications prior to visit.    Review of Systems  Review of Systems  Constitutional: Negative.   HENT: Negative.    Respiratory:  Negative for cough, shortness of breath and wheezing.   Cardiovascular:  Positive for leg swelling.    Physical Exam  There were no vitals taken for this visit. Physical Exam Constitutional:      General: He is not in acute distress.    Appearance: Normal appearance. He is not ill-appearing.  HENT:     Head: Normocephalic and atraumatic.  Cardiovascular:     Rate and Rhythm: Normal rate and regular rhythm.  Pulmonary:     Effort: Pulmonary effort is normal.     Breath sounds: Normal breath sounds. No wheezing or rales.     Comments: Dull rhonchi R>L Neurological:     General: No focal deficit present.     Mental Status: He is alert and oriented to person, place, and time. Mental status is at baseline.  Psychiatric:        Mood and Affect: Mood normal.        Behavior: Behavior normal.        Thought Content: Thought content normal.        Judgment: Judgment normal.      Lab Results:  CBC    Component  Value Date/Time   WBC 5.0 04/24/2024 1715   RBC 3.59 (L) 04/24/2024 1715   HGB 11.4 (L) 04/24/2024 1715   HCT 35.3 (L) 04/24/2024 1715   PLT 220 04/24/2024 1715   MCV 98.3 04/24/2024 1715   MCH 31.8 04/24/2024 1715   MCHC 32.3 04/24/2024 1715  RDW 14.8 04/24/2024 1715   RDW 13.7 03/12/2013 0937   LYMPHSABS 1.4 04/24/2024 1715   LYMPHSABS 1.4 03/12/2013 0937   MONOABS 0.8 04/24/2024 1715   EOSABS 0.3 04/24/2024 1715   EOSABS 0.4 03/12/2013 0937   BASOSABS 0.0 04/24/2024 1715   BASOSABS 0.0 03/12/2013 0937    BMET    Component Value Date/Time   NA 137 04/24/2024 1715   NA 141 03/12/2013 0937   K 4.3 04/24/2024 1715   CL 102 04/24/2024 1715   CO2 26 04/24/2024 1715   GLUCOSE 82 04/24/2024 1715   BUN 27 (H) 04/24/2024 1715   BUN 12 03/12/2013 0937   CREATININE 1.10 04/24/2024 1715   CALCIUM  8.7 (L) 04/24/2024 1715   GFRNONAA >60 04/24/2024 1715   GFRAA >60 11/16/2019 0242    BNP    Component Value Date/Time   BNP 590.4 (H) 04/24/2024 1715    ProBNP    Component Value Date/Time   PROBNP 40.5 04/24/2010 1617    Imaging: DG Chest Port 1 View Result Date: 04/24/2024 CLINICAL DATA:  Hypotension with tightness and stomach and bilateral lower extremities. EXAM: PORTABLE CHEST 1 VIEW COMPARISON:  January 29, 2024 FINDINGS: The heart size and mediastinal contours are within normal limits. An artificial aortic valve is seen. There is no evidence of acute infiltrate, pleural effusion or pneumothorax. Postoperative changes are seen within the cervical spine and lower thoracic spine. Multilevel degenerative changes are present throughout the thoracic spine. IMPRESSION: No active cardiopulmonary disease. Electronically Signed   By: Virgle Grime M.D.   On: 04/24/2024 21:19     Assessment & Plan:     Assessment and Plan    Pulmonary Nodules Patient was referred after having CT scan of the chest for pulmonary nodules. Patient had CT scan of the chest completed in May  2024.  This was completed during recent hospitalization in the emergency room.  Patient was found to have several inflammatory appearing nodules 1 with early mild cavitation concerning for pneumonia versus septic emboli in May 2024.  - Pulmonary nodules under surveillance with no acute respiratory symptoms. Last CT in March showed scattered patchy ground glass opacities lower lobes. CXR in May showed no active cardiopulmonary disease - Schedule follow-up with new pulmonary MD in 3 months.  Pneumonia No current symptoms of acute bronchitis or pneumonia. No cough, fever, or mucus production.  Congestive Heart Failure Chronic congestive heart failure with recent exacerbations leading to hospitalizations in February and March. Managed with diuretics (Lasix  and spironolactone ) and other cardiac medications. Recent leg swelling attributed to heart failure, managed with diuretics. No acute cardiopulmonary disease on recent chest x-ray.  - Provide one-time refill for Lasix  and Jardiance  until primary care follow-up. - Advise follow-up with primary care for ongoing medication management.  Atrial Fibrillation Chronic atrial fibrillation managed with metoprolol , diltiazem , and anticoagulation therapy. Recent hospitalizations for atrial fibrillation and heart failure exacerbations.  Esophageal Dysmotility Esophageal dysmotility identified on swallow study with no visible aspiration. Possible contribution from cervical spine hardware crowding the esophagus. No current symptoms of aspiration or significant reflux. Dysmotility may increase risk for food impaction. - Refer to gastroenterologist for further evaluation and management of esophageal dysmotility.  Recording duration: 16 minutes     Antonio Baumgarten, NP 05/05/2024

## 2024-05-05 NOTE — Patient Instructions (Addendum)
-  PNEUMONIA: Pneumonia is an infection that inflames the air sacs in one or both lungs. You currently have no symptoms of pneumonia, such as cough, fever, or mucus production.  -CONGESTIVE HEART FAILURE: Congestive heart failure is a condition where the heart doesn't pump blood as well as it should. You have had recent exacerbations leading to hospitalizations. Your condition is managed with medications like Lasix  and spironolactone . We will provide a one-time refill for Lasix  and Jardiance  until you can follow up with your primary care doctor.  -ATRIAL FIBRILLATION: Atrial fibrillation is an irregular and often rapid heart rate that can increase the risk of strokes, heart failure, and other heart-related complications. Your condition is managed with medications like metoprolol , diltiazem , and anticoagulation therapy.  -ESOPHAGEAL DYSMOTILITY: Esophageal dysmotility is a condition where the esophagus has difficulty moving food toward the stomach. This may be due to your cervical spine hardware. We will refer you to a gastroenterologist for further evaluation and management.  INSTRUCTIONS: Please follow up with your primary care doctor for ongoing medication management. We will also schedule a follow-up with a pulmonary specialist in 3-4 months. Additionally, you will be referred to a gastroenterologist for further evaluation of your esophageal dysmotility.  Follow-up: Please schedule new patient visit with either Dr. Diania Fortes OR Dr. Marygrace Snellen in 3-4 months (former Dr. Thelda Finney)

## 2024-05-26 ENCOUNTER — Emergency Department (HOSPITAL_BASED_OUTPATIENT_CLINIC_OR_DEPARTMENT_OTHER): Admitting: Radiology

## 2024-05-26 ENCOUNTER — Observation Stay (HOSPITAL_BASED_OUTPATIENT_CLINIC_OR_DEPARTMENT_OTHER)
Admission: EM | Admit: 2024-05-26 | Discharge: 2024-05-30 | Disposition: A | Discharge status: Home / Self Care | Attending: Internal Medicine | Admitting: Internal Medicine

## 2024-05-26 ENCOUNTER — Emergency Department (HOSPITAL_BASED_OUTPATIENT_CLINIC_OR_DEPARTMENT_OTHER)

## 2024-05-26 ENCOUNTER — Other Ambulatory Visit: Payer: Self-pay

## 2024-05-26 ENCOUNTER — Ambulatory Visit: Payer: Self-pay | Admitting: Primary Care

## 2024-05-26 ENCOUNTER — Encounter (HOSPITAL_BASED_OUTPATIENT_CLINIC_OR_DEPARTMENT_OTHER): Payer: Self-pay

## 2024-05-26 DIAGNOSIS — E785 Hyperlipidemia, unspecified: Secondary | ICD-10-CM | POA: Diagnosis not present

## 2024-05-26 DIAGNOSIS — N400 Enlarged prostate without lower urinary tract symptoms: Secondary | ICD-10-CM | POA: Diagnosis present

## 2024-05-26 DIAGNOSIS — I2510 Atherosclerotic heart disease of native coronary artery without angina pectoris: Secondary | ICD-10-CM | POA: Diagnosis not present

## 2024-05-26 DIAGNOSIS — Z7982 Long term (current) use of aspirin: Secondary | ICD-10-CM | POA: Insufficient documentation

## 2024-05-26 DIAGNOSIS — E7989 Other specified disorders of purine and pyrimidine metabolism: Secondary | ICD-10-CM | POA: Diagnosis not present

## 2024-05-26 DIAGNOSIS — I110 Hypertensive heart disease with heart failure: Secondary | ICD-10-CM | POA: Diagnosis not present

## 2024-05-26 DIAGNOSIS — I4819 Other persistent atrial fibrillation: Secondary | ICD-10-CM | POA: Diagnosis present

## 2024-05-26 DIAGNOSIS — J9621 Acute and chronic respiratory failure with hypoxia: Secondary | ICD-10-CM | POA: Diagnosis not present

## 2024-05-26 DIAGNOSIS — D649 Anemia, unspecified: Secondary | ICD-10-CM | POA: Diagnosis not present

## 2024-05-26 DIAGNOSIS — R42 Dizziness and giddiness: Secondary | ICD-10-CM | POA: Diagnosis not present

## 2024-05-26 DIAGNOSIS — I5033 Acute on chronic diastolic (congestive) heart failure: Principal | ICD-10-CM | POA: Insufficient documentation

## 2024-05-26 DIAGNOSIS — J168 Pneumonia due to other specified infectious organisms: Secondary | ICD-10-CM | POA: Diagnosis not present

## 2024-05-26 DIAGNOSIS — J189 Pneumonia, unspecified organism: Secondary | ICD-10-CM | POA: Diagnosis present

## 2024-05-26 DIAGNOSIS — G8929 Other chronic pain: Secondary | ICD-10-CM | POA: Diagnosis present

## 2024-05-26 DIAGNOSIS — R7989 Other specified abnormal findings of blood chemistry: Secondary | ICD-10-CM | POA: Diagnosis present

## 2024-05-26 DIAGNOSIS — R079 Chest pain, unspecified: Secondary | ICD-10-CM | POA: Diagnosis present

## 2024-05-26 LAB — BASIC METABOLIC PANEL WITH GFR
Anion gap: 11 (ref 5–15)
BUN: 22 mg/dL (ref 8–23)
CO2: 26 mmol/L (ref 22–32)
Calcium: 9.8 mg/dL (ref 8.9–10.3)
Chloride: 101 mmol/L (ref 98–111)
Creatinine, Ser: 1.09 mg/dL (ref 0.61–1.24)
GFR, Estimated: >60 mL/min (ref >60)
Glucose, Bld: 84 mg/dL (ref 70–99)
Potassium: 4.7 mmol/L (ref 3.5–5.1)
Sodium: 138 mmol/L (ref 135–145)

## 2024-05-26 LAB — CBC
HCT: 38 % — ABNORMAL LOW (ref 39.0–52.0)
Hemoglobin: 12.3 g/dL — ABNORMAL LOW (ref 13.0–17.0)
MCH: 30.1 pg (ref 26.0–34.0)
MCHC: 32.4 g/dL (ref 30.0–36.0)
MCV: 93.1 fL (ref 80.0–100.0)
Platelets: 171 10*3/uL (ref 150–400)
RBC: 4.08 MIL/uL — ABNORMAL LOW (ref 4.22–5.81)
RDW: 15.7 % — ABNORMAL HIGH (ref 11.5–15.5)
WBC: 6.6 10*3/uL (ref 4.0–10.5)
nRBC: 0 % (ref 0.0–0.2)

## 2024-05-26 LAB — HEPATIC FUNCTION PANEL
ALT: 24 U/L (ref 0–44)
AST: 38 U/L (ref 15–41)
Albumin: 4.3 g/dL (ref 3.5–5.0)
Alkaline Phosphatase: 94 U/L (ref 38–126)
Bilirubin, Direct: 0.2 mg/dL (ref 0.0–0.2)
Indirect Bilirubin: 0.4 mg/dL (ref 0.3–0.9)
Total Bilirubin: 0.6 mg/dL (ref 0.0–1.2)
Total Protein: 7.9 g/dL (ref 6.5–8.1)

## 2024-05-26 LAB — TROPONIN T, HIGH SENSITIVITY: Troponin T High Sensitivity: 27 ng/L — ABNORMAL HIGH (ref <19)

## 2024-05-26 LAB — PRO BRAIN NATRIURETIC PEPTIDE: Pro Brain Natriuretic Peptide: 3172 pg/mL — ABNORMAL HIGH (ref <300.0)

## 2024-05-26 MED ORDER — IOHEXOL 350 MG/ML SOLN
100.0000 mL | Freq: Once | INTRAVENOUS | Status: AC | PRN
  Administered 2024-05-26: 75 mL via INTRAVENOUS

## 2024-05-26 MED ORDER — SODIUM CHLORIDE 0.9 % IV SOLN
500.0000 mg | Freq: Once | INTRAVENOUS | Status: AC
  Administered 2024-05-26: 500 mg via INTRAVENOUS
  Filled 2024-05-26: qty 5

## 2024-05-26 MED ORDER — OXYCODONE HCL 5 MG PO TABS
10.0000 mg | ORAL_TABLET | Freq: Once | ORAL | Status: AC
  Administered 2024-05-26: 10 mg via ORAL
  Filled 2024-05-26: qty 2

## 2024-05-26 MED ORDER — FUROSEMIDE 10 MG/ML IJ SOLN
40.0000 mg | Freq: Once | INTRAMUSCULAR | Status: AC
  Administered 2024-05-26: 40 mg via INTRAVENOUS
  Filled 2024-05-26: qty 4

## 2024-05-26 MED ORDER — ACETAMINOPHEN 500 MG PO TABS
1000.0000 mg | ORAL_TABLET | Freq: Once | ORAL | Status: AC
  Administered 2024-05-26: 1000 mg via ORAL
  Filled 2024-05-26: qty 2

## 2024-05-26 MED ORDER — SODIUM CHLORIDE 0.9 % IV SOLN
1.0000 g | Freq: Once | INTRAVENOUS | Status: AC
  Administered 2024-05-26: 1 g via INTRAVENOUS
  Filled 2024-05-26: qty 10

## 2024-05-26 NOTE — ED Notes (Signed)
 ED Provider at bedside.

## 2024-05-26 NOTE — Telephone Encounter (Signed)
 FYI Only or Action Required?: FYI only for provider  Patient is followed in Pulmonology for Esophageal dysmotility , last seen on 05/05/2024 by Antonio Baumgarten, NP. Called Nurse Triage reporting Shortness of Breath. Symptoms began several weeks ago. Interventions attempted: Nothing. Symptoms are: rapidly worsening.  Triage Disposition: Go to ED Now (Notify PCP)  Patient/caregiver understands and will follow disposition?: Yes                     Copied from CRM [id number]. Topic: Clinical - Red Word Triage >> May 26, 2024 11:51 AM Margarette Shawl wrote: Red Word that prompted transfer to Nurse Triage:   Sensation like throat is being cut in the back Difficulty walking because of SOB Arms and chest feel tight No wheezing Reason for Disposition  [1] MODERATE difficulty breathing (e.g., speaks in phrases, SOB even at rest, pulse 100-120) AND [2] NEW-onset or WORSE than normal  Answer Assessment - Initial Assessment Questions 1. RESPIRATORY STATUS: "Describe your breathing?" (e.g., wheezing, shortness of breath, unable to speak, severe coughing)      Shortness of Breath 2. ONSET: "When did this breathing problem begin?"      X 2 weeks ago 3. PATTERN "Does the difficult breathing come and go, or has it been constant since it started?"      Constant 4. SEVERITY: "How bad is your breathing?" (e.g., mild, moderate, severe)    - MILD: No SOB at rest, mild SOB with walking, speaks normally in sentences, can lie down, no retractions, pulse < 100.    - MODERATE: SOB at rest, SOB with minimal exertion and prefers to sit, cannot lie down flat, speaks in phrases, mild retractions, audible wheezing, pulse 100-120.    - SEVERE: Very SOB at rest, speaks in single words, struggling to breathe, sitting hunched forward, retractions, pulse > 120      Moderate 6. CARDIAC HISTORY: "Do you have any history of heart disease?" (e.g., heart attack, angina, bypass surgery, angioplasty)      CHF 8.  CAUSE: "What do you think is causing the breathing problem?"      Unknown 9. OTHER SYMPTOMS: "Do you have any other symptoms? (e.g., dizziness, runny nose, cough, chest pain, fever)     Sore throat, cough  Protocols used: Breathing Difficulty-A-AH

## 2024-05-26 NOTE — ED Provider Notes (Signed)
 [place] EMERGENCY DEPARTMENT AT [facility] Provider Note   CSN: [id number] Arrival date & time: 05/26/24  1629     History {Add pertinent medical, surgical, social history, OB history to HPI:1} Chief Complaint  Patient presents with   Chest Pain   Weakness    [name] is a 83 y.o. male.  HPI      Lightheaded, stumbling around the house Normally strong-headed person, but feeling like not walking straight, know have neuropathy in feet but felt like "had it in my head too",  Feels like body aint right, can't make body do what its supposed to. Went from good steady walk to old man crawl in the last few weeks.  Told his physician.  Reached up and turn head and felt like sharp pain in throat and scared him a few weeks ago before started feeling dizzy.  Like something in there that wasn't right.  Arms, shoulder, middle of back pain, felt it at the same time as feeling something in throat--a few weeks ago.  Still having some pain in neck, arm, back.  Hurting all over.   Has had a few falls.     On oxycodone  10-325mg  3-4 times a day helps with pain, was taking it before this happened.  It feels like restless legs in body.  No fever  Tightness in the chest, in the last few weeks. , acid reflux in mouth Dyspnea for 3-4 weeks Coughing up phlegm for past week   No headache. No nausea or vomiting. No focal numbness or weakness.  No change in vision or difficulty speaking.  Past Medical History:  Diagnosis Date   Cancer [place] [place])    Carpal tunnel syndrome    Cervical spondylosis with myelopathy    Chronic pain syndrome    Depression    Difficult intubation    needs smaller tube; awake oral fiberoptic scope 8.0 ETT 03/30/08   Dysphagia, pharyngeal phase    Hemorrhage of gastrointestinal tract, unspecified    History of cardiac cath    a. 03/2018 Cath: LAD and LCX with mild luminal irregularities.  No significant disease.   Iron deficiency anemia, unspecified     Lumbosacral spondylosis without myelopathy    Muscle weakness (generalized)    Other and unspecified disc disorder of cervical region    Other and unspecified hyperlipidemia    Patent foramen ovale    a. 05/2018 Echo: post-op TAVR--> + bublble study w/ L->R shunt.   Permanent atrial fibrillation (HCC)    a.  Diagnosed in the spring 2019.  Had rapid atrial fibrillation following TAVR and has been rate controlled with beta-blocker.  CHA2DS2VASc equals 7.  Supposed to be on Eliquis .   Primary localized osteoarthrosis, lower leg    Primary localized osteoarthrosis, shoulder region    Reflux esophagitis    Severe aortic stenosis    a. mild-mod AS by 01/2016 TTE, restricted AV opening with mod AR 01/2016 TEE; b. 10/2017 Echo: EF 60-65%, sev Ca2+ AoV, mean grad ; c. 05/2018 s/p TAVR ([facility]); d. 06/2018 Echo: EF 55-60%, well-seated prosth AoV, peak velocity 156cm/s. AoV gradient .   Spinal stenosis, unspecified region other than cervical    Stroke (HCC)    01/2016   Syncope and collapse    Unspecified arthropathy, lower leg    Unspecified constipation    Unspecified essential hypertension    Unspecified glaucoma(365.9)      Home Medications Prior to Admission medications   Medication Sig Start Date End  Date Taking? Authorizing Provider  apixaban  (ELIQUIS ) 5 MG TABS tablet Take 5 mg by mouth 2 (two) times daily.    [provider]  aspirin  EC 81 MG tablet Take 81 mg by mouth daily.    [provider]  buPROPion  (WELLBUTRIN ) 100 MG tablet Take 100 mg by mouth 2 (two) times daily.    [provider]  diltiazem  (CARDIZEM  CD) 120 MG 24 hr capsule Take 1 capsule (120 mg total) by mouth daily. 03/19/24   Deforest Fast, MD  empagliflozin  (JARDIANCE ) 10 MG TABS tablet Take 1 tablet (10 mg total) by mouth daily. Further refills to come from PCP 05/05/24   Antonio Baumgarten, NP  ferrous sulfate  325 (65 FE) MG tablet Take 325 mg by mouth daily with supper. 03/15/20   [provider]  fluticasone  (FLONASE ) 50 MCG/ACT nasal spray Place 1 spray into both nostrils daily as needed for allergies or rhinitis. 01/24/24   [provider]  furosemide  (LASIX ) 40 MG tablet Take 1 tablet (40 mg total) by mouth daily. 05/05/24   Antonio Baumgarten, NP  gabapentin  (NEURONTIN ) 800 MG tablet Take 800 mg by mouth 3 (three) times daily. 01/02/24   [provider]  lidocaine  (LIDODERM ) 5 % Place 1 patch onto the skin daily as needed (pain). 01/18/24   [provider]  metoprolol  succinate (TOPROL -XL) 25 MG 24 hr tablet Take 25 mg by mouth daily.    [provider]  oxyCODONE -acetaminophen  (PERCOCET) 10-325 MG tablet Take 1 tablet by mouth 5 (five) times daily.    [provider]  potassium chloride  (K-DUR,KLOR-CON ) 20 MEQ tablet Take 1 tablet (20 mEq total) by mouth daily. Patient taking differently: Take 20 mEq by mouth at bedtime. 09/20/18   Strader, Dimple Francis, PA-C  rOPINIRole  (REQUIP ) 0.25 MG tablet Take 0.25 mg by mouth 2 (two) times daily.  10/09/18   [provider]  silver sulfADIAZINE (SILVADENE) 1 % cream Apply 1 Application topically daily as needed (leg sores, wound healing). 11/25/23   [provider]  simvastatin  (ZOCOR ) 40 MG tablet Take 40 mg by mouth at bedtime.    [provider]  spironolactone  (ALDACTONE ) 25 MG tablet Take 0.5 tablets (12.5 mg total) by mouth daily. 03/19/24   Deforest Fast, MD  tamsulosin  (FLOMAX ) 0.4 MG CAPS capsule Take 0.8 mg by mouth 2 (two) times daily. 09/02/19   [provider]  topiramate  (TOPAMAX ) 25 MG capsule Take 25 mg by mouth at bedtime.    [provider]      Allergies    Ativan  Cis.Clayman ], Lyrica [pregabalin], Zanaflex [tizanidine hcl], and Cymbalta [duloxetine hcl]    Review of Systems   Review of Systems  Physical Exam Updated Vital Signs BP 133/84   Pulse 96   Temp 98.3 F (36.8 C) (Oral)   Resp 20   Ht 5\' 9"  (1.753 m)   Wt 121.6  kg   SpO2 97%   BMI 39.58 kg/m  Physical Exam  ED Results / Procedures / Treatments   Labs (all labs ordered are listed, but only abnormal results are displayed) Labs Reviewed  CBC - Abnormal; Notable for the following components:      Result Value   RBC 4.08 (*)    Hemoglobin 12.3 (*)    HCT 38.0 (*)    RDW 15.7 (*)    All other components within normal limits  TROPONIN T, HIGH SENSITIVITY - Abnormal; Notable for the following components:   Troponin T  High Sensitivity 27 (*)    All other components within normal limits  BASIC METABOLIC PANEL WITH GFR    EKG None  Radiology DG Chest 2 View Result Date: 05/26/2024 CLINICAL DATA:  chest pain , dizziness and weakness, dyspnea EXAM: CHEST - 2 VIEW COMPARISON:  Apr 24, 2024 FINDINGS: Cervical and lower thoracic fusion hardware again noted. Hazy opacity along the left lateral lung base/chest wall on the frontal radiograph. Trace pleural effusions. No pneumothorax. No cardiomegaly. Endovascular aortic valve replacement. Tortuous aorta with aortic atherosclerosis. No acute fracture or destructive lesions. Multilevel degenerative disc disease of the spine. IMPRESSION: 1. Hazy opacity along the left lateral lung base/chest wall on the frontal radiograph. This may be artifactual from overlapping bones and soft tissue or reflect a developing bronchopneumonia, in the correct clinical context. 2. Trace pleural effusions. Electronically Signed   By: Rance Burrows M.D.   On: 05/26/2024 17:39    Procedures Procedures  {Document cardiac monitor, telemetry assessment procedure when appropriate:1}  Medications Ordered in ED Medications - No data to display  ED Course/ Medical Decision Making/ A&P   {   Click here for ABCD2, HEART and other calculatorsREFRESH Note before signing :1}                              Medical Decision Making Amount and/or Complexity of Data Reviewed Labs: ordered. Radiology: ordered.   ***  {Document critical  care time when appropriate:1} {Document review of labs and clinical decision tools ie heart score, Chads2Vasc2 etc:1}  {Document your independent review of radiology images, and any outside records:1} {Document your discussion with family members, caretakers, and with consultants:1} {Document social determinants of health affecting pt's care:1} {Document your decision making why or why not admission, treatments were needed:1} Final Clinical Impression(s) / ED Diagnoses Final diagnoses:  None    Rx / DC Orders ED Discharge Orders     None

## 2024-05-26 NOTE — ED Triage Notes (Signed)
 Pt reports dizziness, weakness, chest tightness, SHOB, neck/back pain, difficulty ambulating x2 weeks. Told by PCP x 'couple months ago' that, "hardware from spinal surgery may be moving".

## 2024-05-27 ENCOUNTER — Observation Stay (HOSPITAL_COMMUNITY)

## 2024-05-27 DIAGNOSIS — E785 Hyperlipidemia, unspecified: Secondary | ICD-10-CM

## 2024-05-27 DIAGNOSIS — J189 Pneumonia, unspecified organism: Secondary | ICD-10-CM | POA: Diagnosis not present

## 2024-05-27 DIAGNOSIS — Z7401 Bed confinement status: Secondary | ICD-10-CM | POA: Diagnosis not present

## 2024-05-27 DIAGNOSIS — I2510 Atherosclerotic heart disease of native coronary artery without angina pectoris: Secondary | ICD-10-CM

## 2024-05-27 DIAGNOSIS — N400 Enlarged prostate without lower urinary tract symptoms: Secondary | ICD-10-CM

## 2024-05-27 DIAGNOSIS — I959 Hypotension, unspecified: Secondary | ICD-10-CM | POA: Diagnosis not present

## 2024-05-27 DIAGNOSIS — J9621 Acute and chronic respiratory failure with hypoxia: Secondary | ICD-10-CM | POA: Diagnosis not present

## 2024-05-27 DIAGNOSIS — I5033 Acute on chronic diastolic (congestive) heart failure: Secondary | ICD-10-CM | POA: Diagnosis present

## 2024-05-27 DIAGNOSIS — R42 Dizziness and giddiness: Secondary | ICD-10-CM | POA: Diagnosis not present

## 2024-05-27 DIAGNOSIS — R000 Tachycardia, unspecified: Secondary | ICD-10-CM | POA: Diagnosis not present

## 2024-05-27 DIAGNOSIS — D649 Anemia, unspecified: Secondary | ICD-10-CM

## 2024-05-27 DIAGNOSIS — I2583 Coronary atherosclerosis due to lipid rich plaque: Secondary | ICD-10-CM

## 2024-05-27 DIAGNOSIS — I4819 Other persistent atrial fibrillation: Secondary | ICD-10-CM

## 2024-05-27 DIAGNOSIS — R7989 Other specified abnormal findings of blood chemistry: Secondary | ICD-10-CM

## 2024-05-27 DIAGNOSIS — R531 Weakness: Secondary | ICD-10-CM | POA: Diagnosis not present

## 2024-05-27 DIAGNOSIS — G8929 Other chronic pain: Secondary | ICD-10-CM

## 2024-05-27 LAB — TROPONIN I (HIGH SENSITIVITY): Troponin I (High Sensitivity): 9 ng/L (ref <18)

## 2024-05-27 LAB — TSH: TSH: 3.421 u[IU]/mL (ref 0.350–4.500)

## 2024-05-27 LAB — PROCALCITONIN: Procalcitonin: <0.1 ng/mL

## 2024-05-27 MED ORDER — OXYCODONE-ACETAMINOPHEN 5-325 MG PO TABS: 1.0000 | ORAL_TABLET | Freq: Four times a day (QID) | ORAL | Status: DC | PRN

## 2024-05-27 MED ORDER — ACETAMINOPHEN 650 MG RE SUPP: 650.0000 mg | Freq: Four times a day (QID) | RECTAL | Status: DC | PRN

## 2024-05-27 MED ORDER — PANTOPRAZOLE SODIUM 40 MG PO TBEC
40.0000 mg | DELAYED_RELEASE_TABLET | Freq: Two times a day (BID) | ORAL | Status: DC
  Administered 2024-05-27 – 2024-05-30 (×6): 40 mg via ORAL
  Filled 2024-05-27 (×6): qty 1

## 2024-05-27 MED ORDER — BUPROPION HCL 100 MG PO TABS
100.0000 mg | ORAL_TABLET | Freq: Two times a day (BID) | ORAL | Status: DC
  Administered 2024-05-28 – 2024-05-30 (×6): 100 mg via ORAL
  Filled 2024-05-27 (×8): qty 1

## 2024-05-27 MED ORDER — OXYCODONE-ACETAMINOPHEN 10-325 MG PO TABS: 1.0000 | ORAL_TABLET | Freq: Four times a day (QID) | ORAL | Status: DC | PRN

## 2024-05-27 MED ORDER — FUROSEMIDE 10 MG/ML IJ SOLN
40.0000 mg | Freq: Two times a day (BID) | INTRAMUSCULAR | Status: DC
  Administered 2024-05-27 – 2024-05-28 (×3): 40 mg via INTRAVENOUS
  Filled 2024-05-27 (×3): qty 4

## 2024-05-27 MED ORDER — DILTIAZEM HCL ER COATED BEADS 120 MG PO CP24
120.0000 mg | ORAL_CAPSULE | Freq: Every day | ORAL | Status: DC
  Administered 2024-05-27 – 2024-05-30 (×4): 120 mg via ORAL
  Filled 2024-05-27 (×4): qty 1

## 2024-05-27 MED ORDER — TAMSULOSIN HCL 0.4 MG PO CAPS
0.4000 mg | ORAL_CAPSULE | Freq: Every day | ORAL | Status: DC
  Administered 2024-05-27 – 2024-05-29 (×3): 0.4 mg via ORAL
  Filled 2024-05-27 (×3): qty 1

## 2024-05-27 MED ORDER — GABAPENTIN 100 MG PO CAPS
400.0000 mg | ORAL_CAPSULE | Freq: Three times a day (TID) | ORAL | Status: DC
  Administered 2024-05-27 – 2024-05-30 (×11): 400 mg via ORAL
  Filled 2024-05-27 (×11): qty 4

## 2024-05-27 MED ORDER — ALBUTEROL SULFATE (2.5 MG/3ML) 0.083% IN NEBU: 2.5000 mg | INHALATION_SOLUTION | Freq: Four times a day (QID) | RESPIRATORY_TRACT | Status: DC | PRN

## 2024-05-27 MED ORDER — HYDROXYZINE HCL 50 MG/ML IM SOLN
50.0000 mg | Freq: Once | INTRAMUSCULAR | Status: AC | PRN
  Administered 2024-05-27: 50 mg via INTRAMUSCULAR
  Filled 2024-05-27: qty 1

## 2024-05-27 MED ORDER — APIXABAN 5 MG PO TABS
5.0000 mg | ORAL_TABLET | Freq: Two times a day (BID) | ORAL | Status: DC
  Administered 2024-05-27 – 2024-05-30 (×6): 5 mg via ORAL
  Filled 2024-05-27 (×6): qty 1

## 2024-05-27 MED ORDER — OXYCODONE HCL 5 MG PO TABS: 5.0000 mg | ORAL_TABLET | Freq: Four times a day (QID) | ORAL | Status: DC | PRN

## 2024-05-27 MED ORDER — SPIRONOLACTONE 12.5 MG HALF TABLET
12.5000 mg | ORAL_TABLET | Freq: Every day | ORAL | Status: DC
  Administered 2024-05-27 – 2024-05-29 (×3): 12.5 mg via ORAL
  Filled 2024-05-27 (×3): qty 1

## 2024-05-27 MED ORDER — METOPROLOL SUCCINATE ER 25 MG PO TB24
25.0000 mg | ORAL_TABLET | Freq: Every day | ORAL | Status: DC
  Administered 2024-05-27 – 2024-05-30 (×4): 25 mg via ORAL
  Filled 2024-05-27 (×4): qty 1

## 2024-05-27 MED ORDER — ACETAMINOPHEN 325 MG PO TABS
650.0000 mg | ORAL_TABLET | Freq: Four times a day (QID) | ORAL | Status: DC | PRN
  Administered 2024-05-29 (×2): 650 mg via ORAL
  Filled 2024-05-27 (×2): qty 2

## 2024-05-27 MED ORDER — ASPIRIN 81 MG PO TBEC
81.0000 mg | DELAYED_RELEASE_TABLET | Freq: Every day | ORAL | Status: DC
  Administered 2024-05-27 – 2024-05-30 (×4): 81 mg via ORAL
  Filled 2024-05-27 (×4): qty 1

## 2024-05-27 MED ORDER — ROPINIROLE HCL 0.25 MG PO TABS
0.2500 mg | ORAL_TABLET | Freq: Every day | ORAL | Status: DC
  Administered 2024-05-27 – 2024-05-29 (×3): 0.25 mg via ORAL
  Filled 2024-05-27 (×4): qty 1

## 2024-05-27 MED ORDER — OXYCODONE HCL 5 MG PO TABS
10.0000 mg | ORAL_TABLET | Freq: Four times a day (QID) | ORAL | Status: DC | PRN
  Administered 2024-05-27 – 2024-05-30 (×12): 10 mg via ORAL
  Filled 2024-05-27 (×12): qty 2

## 2024-05-27 MED ORDER — SIMVASTATIN 20 MG PO TABS
40.0000 mg | ORAL_TABLET | Freq: Every day | ORAL | Status: DC
  Administered 2024-05-27 – 2024-05-29 (×3): 40 mg via ORAL
  Filled 2024-05-27 (×3): qty 2

## 2024-05-27 NOTE — ED Notes (Signed)
 Patient placed on 2LNC at this time due to desaturations while sleeping. RN notified.

## 2024-05-27 NOTE — ED Notes (Signed)
 Pt requesting meds for restless legs, Schlossman MD made aware

## 2024-05-27 NOTE — ED Notes (Signed)
 RT Note: Patient was placed on a 3lpm simple mask due to the fact that his oxygen saturation decreased to 85% while sleeping. He stated that he is a mouth breather as well. No documented note of any sleep apnea diagnosis at this time.   He is tolerating the mask well at this time.

## 2024-05-27 NOTE — Plan of Care (Signed)

## 2024-05-27 NOTE — ED Notes (Signed)
 Pt moved to room 9, he had been sleeping.  Pt given ice water after swallow eval.  Pt also given coffee per request.  Urinal at the bedside.

## 2024-05-27 NOTE — ED Notes (Signed)
 RT Note: Patient was 100% on 3lpm simple mask. Decreased FIO2 to 2lpm and he is still tolerating well at this time.

## 2024-05-27 NOTE — ED Notes (Signed)
 Report given to the Floor RN.Marland KitchenMarland Kitchen

## 2024-05-27 NOTE — H&P (Addendum)
 History and Physical    Patient: [name] ZOX:[id number] DOB: Feb 18, 1941 DOA: 05/26/2024 DOS: the patient was seen and examined on 05/27/2024 PCP: Allene Ivan, MD  Patient coming from: Transfer from [facility]  Chief Complaint:  Chief Complaint  Patient presents with   Chest Pain   Weakness   HPI: [name] is a 83 y.o. male with medical history significant of f hypertension, CAD, chronic HFpEF, aortic stenosis status post TAVR, chronic A-fib on Eliquis , history of CVA, chronic back pain/history of multiple back surgeries, depression who presents with complaints of progressively worsening dizziness and balance issues.  He experiences dizziness and difficulty maintaining balance, which has led to falls. The dizziness is described as a lack of control over falling rather than a sensation of the room spinning. He uses a cane for assistance when walking.  Despite this patient reports that he has had several falls.  He has experienced swelling in his legs previously, describing his legs as getting 'big and water in them.'  He acknowledges having progressively worsening shortness of breath over the last couple weeks.  He has had an intermittent productive cough and  is not on oxygen  normally.  He does report having pain in his back and all over.  In the ED patient was noted to be afebrile with stable vital signs.  Labs significant for BNP 3172 and hemoglobin 12.3.  Chest x-ray noted hazy opacity along the left lateral lung base that was thought to possibly be artifactual versus developing bronchopneumonia.  CT scan of the head did not reveal any large vessel occlusion.  The brain have been ordered.  Patient had been given empiric antibiotics of Rocephin , azithromycin , oxycodone  10 mg p.o., and Lasix  40 mg IV.  Review of Systems: As mentioned in the history of present illness. All other systems reviewed and are negative. Past Medical History:  Diagnosis Date   Cancer [facility])     Carpal tunnel syndrome    Cervical spondylosis with myelopathy    Chronic pain syndrome    Depression    Difficult intubation    needs smaller tube; awake oral fiberoptic scope 8.0 ETT 03/30/08   Dysphagia, pharyngeal phase    Hemorrhage of gastrointestinal tract, unspecified    History of cardiac cath    a. 03/2018 Cath: LAD and LCX with mild luminal irregularities.  No significant disease.   Iron deficiency anemia, unspecified    Lumbosacral spondylosis without myelopathy    Muscle weakness (generalized)    Other and unspecified disc disorder of cervical region    Other and unspecified hyperlipidemia    Patent foramen ovale    a. 05/2018 Echo: post-op TAVR--> + bublble study w/ L->R shunt.   Permanent atrial fibrillation (HCC)    a.  Diagnosed in the spring 2019.  Had rapid atrial fibrillation following TAVR and has been rate controlled with beta-blocker.  CHA2DS2VASc equals 7.  Supposed to be on Eliquis .   Primary localized osteoarthrosis, lower leg    Primary localized osteoarthrosis, shoulder region    Reflux esophagitis    Severe aortic stenosis    a. mild-mod AS by 01/2016 TTE, restricted AV opening with mod AR 01/2016 TEE; b. 10/2017 Echo: EF 60-65%, sev Ca2+ AoV, mean grad ; c. 05/2018 s/p TAVR ([facility]); d. 06/2018 Echo: EF 55-60%, well-seated prosth AoV, peak velocity 156cm/s. AoV gradient .   Spinal stenosis, unspecified region other than cervical    Stroke (HCC)    01/2016   Syncope and  collapse    Unspecified arthropathy, lower leg    Unspecified constipation    Unspecified essential hypertension    Unspecified glaucoma(365.9)    Past Surgical History:  Procedure Laterality Date   BILATERAL CATARACT SURGERY  2009   DR GROAT    BIOPSY  10/14/2018   Procedure: BIOPSY;  Surgeon: Celedonio Coil, MD;  Location: [facility] ENDOSCOPY;  Service: Endoscopy;;   CARDIAC VALVE REPLACEMENT  05/2018   CERVIACAL SPINE (4X)     DR MARK ROY    COLONOSCOPY  2007   DR HENSEL     COLONOSCOPY WITH PROPOFOL  N/A 11/20/2017   Procedure: COLONOSCOPY WITH PROPOFOL ;  Surgeon: Baldo Bonds, MD;  Location: [facility] ENDOSCOPY;  Service: Endoscopy;  Laterality: N/A;   ESOPHAGOGASTRODUODENOSCOPY (EGD) WITH PROPOFOL  N/A 10/14/2018   Procedure: ESOPHAGOGASTRODUODENOSCOPY (EGD) WITH PROPOFOL ;  Surgeon: Celedonio Coil, MD;  Location: [facility] ENDOSCOPY;  Service: Endoscopy;  Laterality: N/A;   JOINT REPLACEMENT     both knees   LEFT KNEE REPLACEMENT     DR ALUSIO   LEFT TRANSVERSE CARPAL LIGAMENT  01/06/2008   ROTATOR CUFF LEFT SHOULDER  2001   DR MURPHY    SHOULDER OPEN ROTATOR CUFF REPAIR  2006   DR [facility]   SPINE SURGERY     neck fusion   Social History:  reports that he quit smoking about 38 years ago. His smoking use included cigarettes. He quit smokeless tobacco use about 39 years ago. He reports that he does not drink alcohol and does not use drugs.  Allergies  Allergen Reactions   Ativan  [Lorazepam ] Other (See Comments)    Delirium   Lyrica [Pregabalin] Swelling and Other (See Comments)    Triggered PTSD Nightmares Aggression   Zanaflex [Tizanidine Hcl] Swelling and Anxiety   Cymbalta [Duloxetine Hcl] Other (See Comments)    Increased depression Irritability     Family History  Problem Relation Age of Onset   Cancer Mother 78       cervical cancer   Cancer Father 76       lung  cancer    Prior to Admission medications   Medication Sig Start Date End Date Taking? Authorizing Provider  apixaban  (ELIQUIS ) 5 MG TABS tablet Take 5 mg by mouth 2 (two) times daily.    [provider]  aspirin  EC 81 MG tablet Take 81 mg by mouth daily.    [provider]  buPROPion  (WELLBUTRIN ) 100 MG tablet Take 100 mg by mouth 2 (two) times daily.    [provider]  diltiazem  (CARDIZEM  CD) 120 MG 24 hr capsule Take 1 capsule (120 mg total) by mouth daily. 03/19/24   Deforest Fast, MD  ferrous sulfate  325 (65 FE) MG tablet Take 325 mg by mouth daily with  supper. 03/15/20   [provider]  furosemide  (LASIX ) 40 MG tablet Take 1 tablet (40 mg total) by mouth daily. Patient taking differently: Take 20 mg by mouth daily. May take additional dose as needed for leg swelling 05/05/24   Antonio Baumgarten, NP  gabapentin  (NEURONTIN ) 800 MG tablet Take 800 mg by mouth 3 (three) times daily. 01/02/24   [provider]  metoprolol  succinate (TOPROL -XL) 25 MG 24 hr tablet Take 25 mg by mouth daily.    [provider]  oxyCODONE -acetaminophen  (PERCOCET) 10-325 MG tablet Take 1 tablet by mouth every 6 (six) hours as needed for pain.    [provider]  pantoprazole  (PROTONIX ) 40 MG tablet Take 40 mg by  mouth 2 (two) times daily.    [provider]  Potassium Chloride  ER 20 MEQ TBCR Take 20 mEq by mouth daily.    [provider]  rOPINIRole  (REQUIP ) 0.25 MG tablet Take 0.25 mg by mouth 2 (two) times daily.  10/09/18   [provider]  simvastatin  (ZOCOR ) 40 MG tablet Take 40 mg by mouth at bedtime.    [provider]  spironolactone  (ALDACTONE ) 25 MG tablet Take 0.5 tablets (12.5 mg total) by mouth daily. Patient taking differently: Take 25 mg by mouth daily. 03/19/24   Deforest Fast, MD  sucralfate  (CARAFATE ) 1 g tablet Take 1 g by mouth 2 (two) times daily.    [provider]  tamsulosin  (FLOMAX ) 0.4 MG CAPS capsule Take 0.4 mg by mouth at bedtime. 09/02/19   [provider]  topiramate  (TOPAMAX ) 25 MG capsule Take 25 mg by mouth 2 (two) times daily.    [provider]    Physical Exam: Vitals:   05/27/24 0955 05/27/24 0956 05/27/24 1000 05/27/24 1100  BP: (!) 140/90 (!) 140/90 (!) 147/98 103/60  Pulse:  96 88 87  Resp:   16 20  Temp:    97.6 F (36.4 C)  TempSrc:    Oral  SpO2:   97% 100%  Weight:      Height:        Constitutional: Elderly male who appears to be in no acute distress. Eyes: PERRL, lids and conjunctivae normal ENMT: Mucous membranes  are moist.  .Normal dentition.  Neck: normal, supple, no masses, no thyromegaly Respiratory: clear to auscultation bilaterally, no wheezing, no crackles. Normal respiratory effort. No accessory muscle use.  Cardiovascular:  Irregular, irregular. 2+ pedal pulses. No carotid bruits.  Abdomen: no tenderness, no masses palpated.  Bowel sounds positive.  Musculoskeletal: no clubbing / cyanosis. No joint deformity upper and lower extremities. Good ROM, no contractures. Normal muscle tone.  Skin: no rashes, lesions, ulcers. No induration Neurologic: CN 2-12 grossly intact. Strength 5/5 in all 4.  Psychiatric: Normal judgment and insight. Alert and oriented x 3. Normal mood.   Data Reviewed:  EKG revealed atrial fibrillation at 93 bpm with right axis deviation.  Reviewed labs, imaging, and pertinent records as documented.  Assessment and Plan:   Acute on chronic respiratory failure with hypoxia secondary to congestive heart failure Patient reported having progressively worsening shortness of breath over the last couple weeks.  Currently requiring 3 L nasal cannula oxygen to maintain O2 saturations.  Normally patient not on oxygen at baseline.  BNP elevated at 3172.  Last echocardiogram noted EF to be 65 to 70% with indeterminate diastolic parameters when checked 01/2024.  Patient had been given Lasix  40 mg IV x 1 dose. - Admit to a cardiac telemetry bed -Continue pulse oximetry with nasal cannula oxygen maintain O2 saturation greater than 90%. - Strict I&O's and daily weights - Heart failure order set utilized - Lasix  40 mg IV.  Reassess need of further IV diuresis in a.m. - Continue beta-blocker  Possible pneumonia  Patient reports that he has had a mild intermittent cough but nothing significant.  Chest x-ray noted hazy opacity along the left lateral lung base that was thought to possibly be artifactual versus developing bronchopneumonia.  Patient had initially been given empiric antibiotics of  Rocephin  and azithromycin .  During previous hospitalization there was concern for the possibility of aspiration. - Aspiration precautions with elevation head of bed - Check procalcitonin  Dizziness Patient presents with complaints of  dizziness and feeling off balance.  Reported having several falls due to symptoms.  CT angiogram of the head and neck did not reveal any acute abnormality.    - Neurochecks - MRI of the brain (did not reveal any acute abnormality) - Check orthostatic vital signs - Check TSH - PT/OT to eval and treat - Check O2 saturations with ambulation  Persistent atrial fibrillation on chronic anticoagulation Patient appears to chronically be in atrial fibrillation but currently rate controlled. - Continue Cardizem  and Eliquis   CAD Elevated troponin Acute.  High-sensitivity troponin mildly elevated 27. - Trend troponin - Continue aspirin , metoprolol , statin  Normocytic anemia Hemoglobin noted to be stable at 12.3 g/dL. - Continue to monitor  Chronic pain - Continue oxycodone  and gabapentin .  Hyperlipidemia - Continue simvastatin   BPH - Continue tamsulosin   DVT prophylaxis: Eliquis  Advance Care Planning:   Code Status: Full Code   Consults: none  Family Communication: none  Severity of Illness: The appropriate patient status for this patient is OBSERVATION. Observation status is judged to be reasonable and necessary in order to provide the required intensity of service to ensure the patient's safety. The patient's presenting symptoms, physical exam findings, and initial radiographic and laboratory data in the context of their medical condition is felt to place them at decreased risk for further clinical deterioration. Furthermore, it is anticipated that the patient will be medically stable for discharge from the hospital within 2 midnights of admission.   Author: Lena Qualia, MD 05/27/2024 12:18 PM  For on call review [email or web address].

## 2024-05-27 NOTE — ED Notes (Signed)
 Lucretia Kern at [facility] for transport

## 2024-05-28 ENCOUNTER — Observation Stay (HOSPITAL_COMMUNITY)

## 2024-05-28 DIAGNOSIS — R079 Chest pain, unspecified: Secondary | ICD-10-CM | POA: Diagnosis not present

## 2024-05-28 DIAGNOSIS — J189 Pneumonia, unspecified organism: Secondary | ICD-10-CM | POA: Diagnosis not present

## 2024-05-28 DIAGNOSIS — I5033 Acute on chronic diastolic (congestive) heart failure: Secondary | ICD-10-CM | POA: Diagnosis not present

## 2024-05-28 DIAGNOSIS — J9621 Acute and chronic respiratory failure with hypoxia: Secondary | ICD-10-CM | POA: Diagnosis not present

## 2024-05-28 DIAGNOSIS — R42 Dizziness and giddiness: Secondary | ICD-10-CM | POA: Diagnosis not present

## 2024-05-28 LAB — ECHOCARDIOGRAM COMPLETE
AR max vel: 3.64 cm2
AV Area VTI: 3.25 cm2
AV Area mean vel: 3.59 cm2
AV Mean grad: 5.3 mmHg
AV Peak grad: 10.1 mmHg
Ao pk vel: 1.59 m/s
Area-P 1/2: 3.99 cm2
Calc EF: 66.1 %
Height: 69 in
MV VTI: 2.64 cm2
S' Lateral: 2.5 cm
Single Plane A2C EF: 65.2 %
Single Plane A4C EF: 66.2 %
Weight: 4288 [oz_av]

## 2024-05-28 LAB — BASIC METABOLIC PANEL WITH GFR
Anion gap: 10 (ref 5–15)
BUN: 25 mg/dL — ABNORMAL HIGH (ref 8–23)
CO2: 30 mmol/L (ref 22–32)
Calcium: 9.2 mg/dL (ref 8.9–10.3)
Chloride: 97 mmol/L — ABNORMAL LOW (ref 98–111)
Creatinine, Ser: 1.28 mg/dL — ABNORMAL HIGH (ref 0.61–1.24)
GFR, Estimated: 56 mL/min — ABNORMAL LOW (ref >60)
Glucose, Bld: 92 mg/dL (ref 70–99)
Potassium: 4.7 mmol/L (ref 3.5–5.1)
Sodium: 137 mmol/L (ref 135–145)

## 2024-05-28 LAB — CBC
HCT: 40.6 % (ref 39.0–52.0)
Hemoglobin: 12.8 g/dL — ABNORMAL LOW (ref 13.0–17.0)
MCH: 30 pg (ref 26.0–34.0)
MCHC: 31.5 g/dL (ref 30.0–36.0)
MCV: 95.1 fL (ref 80.0–100.0)
Platelets: 177 10*3/uL (ref 150–400)
RBC: 4.27 MIL/uL (ref 4.22–5.81)
RDW: 15.7 % — ABNORMAL HIGH (ref 11.5–15.5)
WBC: 7.5 10*3/uL (ref 4.0–10.5)
nRBC: 0 % (ref 0.0–0.2)

## 2024-05-28 NOTE — Hospital Course (Signed)
 83 y.o. male with medical history significant of f hypertension, CAD, chronic HFpEF, aortic stenosis status post TAVR, chronic A-fib on Eliquis , history of CVA, chronic back pain/history of multiple back surgeries, depression who presents with complaints of progressively worsening dizziness and balance issues.   He experiences dizziness and difficulty maintaining balance, which has led to falls. The dizziness is described as a lack of control over falling rather than a sensation of the room spinning. He uses a cane for assistance when walking.  Despite this patient reports that he has had several falls.   He has experienced swelling in his legs previously, describing his legs as getting 'big and water in them.'   He acknowledges having progressively worsening shortness of breath over the last couple weeks.  He has had an intermittent productive cough and  is not on oxygen  normally.  He does report having pain in his back and all over.   In the ED patient was noted to be afebrile with stable vital signs.  Labs significant for BNP 3172 and hemoglobin 12.3.  Chest x-ray noted hazy opacity along the left lateral lung base that was thought to possibly be artifactual versus developing bronchopneumonia.  CT scan of the head did not reveal any large vessel occlusion.  The brain have been ordered.  Patient had been given empiric antibiotics of Rocephin , azithromycin , oxycodone  10 mg p.o., and Lasix  40 mg IV.

## 2024-05-28 NOTE — Plan of Care (Signed)

## 2024-05-28 NOTE — Progress Notes (Signed)
 Progress Note   Patient: [name] XBJ:[id number] DOB: 11-12-1941 DOA: 05/26/2024     0 DOS: the patient was seen and examined on 05/28/2024   Brief hospital course: 83 y.o. male with medical history significant of f hypertension, CAD, chronic HFpEF, aortic stenosis status post TAVR, chronic A-fib on Eliquis , history of CVA, chronic back pain/history of multiple back surgeries, depression who presents with complaints of progressively worsening dizziness and balance issues.   He experiences dizziness and difficulty maintaining balance, which has led to falls. The dizziness is described as a lack of control over falling rather than a sensation of the room spinning. He uses a cane for assistance when walking.  Despite this patient reports that he has had several falls.   He has experienced swelling in his legs previously, describing his legs as getting 'big and water in them.'   He acknowledges having progressively worsening shortness of breath over the last couple weeks.  He has had an intermittent productive cough and  is not on oxygen  normally.  He does report having pain in his back and all over.   In the ED patient was noted to be afebrile with stable vital signs.  Labs significant for BNP 3172 and hemoglobin 12.3.  Chest x-ray noted hazy opacity along the left lateral lung base that was thought to possibly be artifactual versus developing bronchopneumonia.  CT scan of the head did not reveal any large vessel occlusion.  The brain have been ordered.  Patient had been given empiric antibiotics of Rocephin , azithromycin , oxycodone  10 mg p.o., and Lasix  40 mg IV.  Assessment and Plan: Acute on chronic respiratory failure with hypoxia secondary to congestive heart failure Pt had required 3 L nasal cannula oxygen to maintain O2 saturations.  Normally patient not on oxygen at baseline.   BNP elevated at 3172.  - continued with Lasix  40 mg IV. - Continue beta-blocker   Possible pneumonia   Patient reports that he has had a mild intermittent cough but nothing significant.  Chest x-ray noted hazy opacity along the left lateral lung base that was thought to possibly be artifactual versus developing bronchopneumonia.  Patient had initially been given empiric antibiotics of Rocephin  and azithromycin .  During previous hospitalization there was concern for the possibility of aspiration. - Aspiration precautions with elevation head of bed - Procal is <0.1   Dizziness Patient presents with complaints of dizziness and feeling off balance.  Reported having several falls due to symptoms.  CT angiogram of the head and neck did not reveal any acute abnormality.    - Neurochecks - MRI reviewed, no acute process - Check orthostatic vital signs - TSH 3.421 - PT/OT consulted - Would check O2 sats with ambulation   Persistent atrial fibrillation on chronic anticoagulation Patient appears to chronically be in atrial fibrillation but currently rate controlled. - Continue Cardizem  and Eliquis    CAD Elevated troponin Acute.  High-sensitivity troponin mildly elevated 27. - Trend troponin - Continue aspirin , metoprolol , statin   Normocytic anemia Hemoglobin noted to be stable at 12.3 g/dL. - Continue to monitor   Chronic pain - Continue oxycodone  and gabapentin .   Hyperlipidemia - Continue simvastatin    BPH - Continue tamsulosin        Subjective: Feeling better today  Physical Exam: Vitals:   05/28/24 0001 05/28/24 0421 05/28/24 0753 05/28/24 1046  BP: 127/68 118/74 (!) 104/90   Pulse: 89 82 95   Resp: 16 16 18    Temp: 97.8 F (36.6 C) (!)  97.5 F (36.4 C) 98.8 F (37.1 C)   TempSrc:  Oral    SpO2: 100% 100% 100% 96%  Weight:      Height:       General exam: Awake, laying in bed, in nad Respiratory system: Normal respiratory effort, no wheezing Cardiovascular system: regular rate, s1, s2 Gastrointestinal system: Soft, nondistended, positive BS Central nervous  system: CN2-12 grossly intact, strength intact Extremities: Perfused, no clubbing Skin: Normal skin turgor, no notable skin lesions seen Psychiatry: Mood normal // no visual hallucinations   Data Reviewed:  Labs reviewed: Na 137, K 4.7, Cr 1.28, WBC 7.5, Hgb 12.8, Plts 177  Family Communication: Pt in room, family at bedside  Disposition: Status is: Observation The patient remains OBS appropriate and will d/c before 2 midnights.  Planned Discharge Destination: Home     Author: Cherylle Corwin, MD 05/28/2024 4:59 PM  For on call review [email or web address].

## 2024-05-28 NOTE — Evaluation (Signed)
 Occupational Therapy Evaluation Patient Details Name: [name] MRN: [id number] DOB: 03/26/1941 Today's Date: 05/28/2024   History of Present Illness   [name] is a 83 y.o. male presenting with complaints of progressively worsening dizziness and balance issues which has led to falls. PMH: hypertension, CAD, chronic HFpEF, aortic stenosis status post TAVR, chronic A-fib on Eliquis , history of CVA, chronic back pain/history of multiple back surgeries, depression     Clinical Impressions Pt c/o chronic pain to low back and neck, decreased balance, and weakness from lying in bed for two days, eager to perform OOB activities. Pt lives at home with sister who is available 24/7, PLOF mod I with cane. Pt currently likely close to baseline for ADLs, with mobility Pt did lose balance standing using cane, posterior lean back into bed. Pt did better with RW for support, able to ambulate 60 feet at CGA. Pt able to don/doff socks/shoes with set up, has history of RTC tears limiting overhead reach, able to complete UB bathing/dressing with increased time/effort. Pt would benefit from continued acute OT to maximize safety with OOB activities and improve independence with ADLs, Pt denies need for follow up OT as he has his house set up well and has all DME needed to remain safe/independent.      If plan is discharge home, recommend the following:   A little help with walking and/or transfers;A little help with bathing/dressing/bathroom;Assistance with cooking/housework;Assist for transportation;Help with stairs or ramp for entrance     Functional Status Assessment   Patient has had a recent decline in their functional status and demonstrates the ability to make significant improvements in function in a reasonable and predictable amount of time.     Equipment Recommendations   None recommended by OT     Recommendations for Other Services         Precautions/Restrictions    Precautions Precautions: Fall Recall of Precautions/Restrictions: Intact Precaution/Restrictions Comments: takes a lot of oxycodone  daily, has pain management MD Restrictions Weight Bearing Restrictions Per Provider Order: No     Mobility Bed Mobility Overal bed mobility: Needs Assistance             General bed mobility comments: sitting EOB upon arrival    Transfers Overall transfer level: Needs assistance Equipment used: Rolling walker (2 wheels) Transfers: Sit to/from Stand, Bed to chair/wheelchair/BSC Sit to Stand: Contact guard assist     Step pivot transfers: Contact guard assist     General transfer comment: unable to stand with cane alone, RW for support for transfers      Balance Overall balance assessment: Needs assistance Sitting-balance support: Single extremity supported, Feet supported Sitting balance-Leahy Scale: Poor Sitting balance - Comments: posterior lean, stiff low back, using bed rail to prevent from leaning back. Postural control: Posterior lean Standing balance support: Bilateral upper extremity supported, During functional activity Standing balance-Leahy Scale: Poor Standing balance comment: attempted to stand with cane but lost balance, using RW Pt able to stand with B hands supported                           ADL either performed or assessed with clinical judgement   ADL Overall ADL's : Needs assistance/impaired Eating/Feeding: Independent   Grooming: Set up;Sitting   Upper Body Bathing: Set up;Supervision/ safety;Sitting   Lower Body Bathing: Set up;Supervison/ safety;Sitting/lateral leans   Upper Body Dressing : Set up;Supervision/safety;Sitting   Lower Body Dressing: Set up;Supervision/safety;Sitting/lateral leans  Toilet Transfer: Contact guard assist;Rolling walker (2 wheels)   Toileting- Clothing Manipulation and Hygiene: Set up;Supervision/safety;Sitting/lateral lean       Functional mobility during ADLs:  Contact guard assist;Rolling walker (2 wheels) General ADL Comments: likely clsoe to baseline, set up/supervision for the most part, increased effort. history of RTC tears making UB dressing/bathing increasingly difficult, but Pt motivated to be independent. Pt able to don/doff socks, CGA for standing due to decreased BLE strength and difficulty powering STS, reliant on RW for support.     Vision Baseline Vision/History: 1 Wears glasses Ability to See in Adequate Light: 0 Adequate       Perception         Praxis         Pertinent Vitals/Pain Pain Assessment Pain Assessment: 0-10 Pain Score: 6  Pain Location: neck and back (chronic) Pain Descriptors / Indicators: Constant Pain Intervention(s): Monitored during session     Extremity/Trunk Assessment Upper Extremity Assessment Upper Extremity Assessment: RUE deficits/detail;LUE deficits/detail RUE Deficits / Details: history of B RTC tears, significantly limited ROM RUE: Shoulder pain with ROM RUE Sensation: history of peripheral neuropathy RUE Coordination: WNL LUE Deficits / Details: history of B RTC tears, significantly limited ROM LUE: Shoulder pain with ROM LUE Sensation: history of peripheral neuropathy LUE Coordination: WNL           Communication Communication Communication: No apparent difficulties   Cognition Arousal: Alert Behavior During Therapy: Impulsive Cognition: No apparent impairments                               Following commands: Intact       Cueing  General Comments   Cueing Techniques: Verbal cues  VSS on RA, O2 96% on RA after activities   Exercises     Shoulder Instructions      Home Living Family/patient expects to be discharged to:: Private residence Living Arrangements: Other relatives Available Help at Discharge: Family;Available 24 hours/day Type of Home: House Home Access: Level entry     Home Layout: One level     Bathroom Shower/Tub: Retail banker: Handicapped height Bathroom Accessibility: Yes How Accessible: Accessible via walker Home Equipment: Wheelchair - Forensic psychologist (2 wheels);Cane - single point;Rollator (4 wheels)   Additional Comments: Pt lives with sister, available 24/7      Prior Functioning/Environment Prior Level of Function : Independent/Modified Independent             Mobility Comments: reports ambulating with cane ADLs Comments: indep, cooks, cleans, takes care of sister    OT Problem List: Decreased strength;Decreased range of motion;Decreased activity tolerance;Impaired balance (sitting and/or standing);Impaired UE functional use;Pain   OT Treatment/Interventions: Self-care/ADL training;Therapeutic exercise;Energy conservation;DME and/or AE instruction;Therapeutic activities;Patient/family education;Balance training      OT Goals(Current goals can be found in the care plan section)   Acute Rehab OT Goals Patient Stated Goal: to manage pain, improve balance OT Goal Formulation: With patient/family Time For Goal Achievement: 06/11/24 Potential to Achieve Goals: Good   OT Frequency:  Min 2X/week    Co-evaluation              AM-PAC OT 6 Clicks Daily Activity     Outcome Measure Help from another person eating meals?: None Help from another person taking care of personal grooming?: A Little Help from another person toileting, which includes using toliet, bedpan, or urinal?: A Little Help from another person  bathing (including washing, rinsing, drying)?: A Little Help from another person to put on and taking off regular upper body clothing?: A Little Help from another person to put on and taking off regular lower body clothing?: A Little 6 Click Score: 19   End of Session Equipment Utilized During Treatment: Gait belt;Rolling walker (2 wheels) Nurse Communication: Mobility status  Activity Tolerance: Patient tolerated treatment well Patient left:  in bed;with call bell/phone within reach;with bed alarm set;with family/visitor present  OT Visit Diagnosis: Unsteadiness on feet (R26.81);Other abnormalities of gait and mobility (R26.89);Muscle weakness (generalized) (M62.81);Pain                Time: 1335-1409 OT Time Calculation (min): 34 min Charges:  OT General Charges $OT Visit: 1 Visit OT Evaluation $OT Eval Moderate Complexity: 1 Mod OT Treatments $Self Care/Home Management : 8-22 mins  [place], OTR/L   Scherry Curtis 05/28/2024, 2:17 PM

## 2024-05-28 NOTE — Progress Notes (Signed)
 PT Cancellation Note  Patient Details Name: [name] MRN: [id number] DOB: 1941/11/29   Cancelled Treatment:    Reason Eval/Treat Not Completed: Patient at procedure or test/unavailable. Returned to ambulate with patient per his request however pt was undergoing bedside US . Aware OT will be seeing patient as well today. Acute PT to return as able to progress ambulation.  Namine Beahm, PT, DPT Acute Rehabilitation Services Secure chat preferred Office #: [phone]    Jenna Moan 05/28/2024, 11:56 AM

## 2024-05-28 NOTE — Care Management (Signed)
 Transition of Care [facility]) - Inpatient Brief Assessment   Patient Details  Name: DETAVIOUS RINN MRN: [id number] Date of Birth: 05-15-1941  Transition of Care [facility]) CM/SW Contact:    Ronni Colace, RN Phone Number: 05/28/2024, 12:45 PM   Clinical Narrative:  83 yo patient presented with dizziness, having falls. PT and OT consulted and evaluated patient. Met with patient and sister at bedside. Introduced self and explained role. They are unsure of home health, they already have someone helping him and they have rescue  dogs,  They have all DME needed. They state they will call if interested, Gave them the Medicare.gov list to look over for home health a copy was placed in the shadow chart.   Transition of Care Asessment: Insurance and Status: Insurance coverage has been reviewed Patient has primary care physician: Yes Home environment has been reviewed: yes Prior level of function:: Independent with DME   Social Drivers of Health Review: SDOH reviewed no interventions necessary Readmission risk has been reviewed: Yes Transition of care needs: transition of care needs identified, TOC will continue to follow

## 2024-05-28 NOTE — Progress Notes (Signed)
  Echocardiogram 2D Echocardiogram has been performed.  [name] 05/28/2024, 12:07 PM

## 2024-05-28 NOTE — Evaluation (Signed)
 Physical Therapy Evaluation Patient Details Name: [name] MRN: [id number] DOB: [birth year] Today's Date: 05/28/2024  History of Present Illness  [name] is a 83 y.o. male presenting with complaints of progressively worsening dizziness and balance issues which has led to falls. PMH: hypertension, CAD, chronic HFpEF, aortic stenosis status post TAVR, chronic A-fib on Eliquis , history of CVA, chronic back pain/history of multiple back surgeries, depression   Clinical Impression  Pt admitted with above. Pt very irritated regarding not having anything to eat since yesterday morning, having the bed alarm on, and not receiving his normal oxycodone .  PT and RN practiced active listening and was able to get breakfast ordered, RN administered pain meds, and PT was able to assist pt to EOB. Pt adamant about not being dizzy at this time but demo'd instability upon standing with cane. Pt declined ambulation due to breakfast tray arriving and wanting to eat. 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 Acute to continue to follow. Aware patient will refuse  rehab program at this time so will focus on ambulation and improving balance while in hospital with hopes patient with agree to work with [facility] PT.       If plan is discharge home, recommend the following: A little help with walking and/or transfers;A little help with bathing/dressing/bathroom;Supervision due to cognitive status;Assist for transportation   Can travel by private vehicle        Equipment Recommendations None recommended by PT (Has DME)  Recommendations for Other Services       Functional Status Assessment Patient has had a recent decline in their functional status and demonstrates the ability to make significant improvements in function in a reasonable and predictable amount of time.     Precautions / Restrictions Precautions Precautions: Fall Precaution/Restrictions Comments: takes  a lot of oxycodone  daily, has pain management MD Restrictions Weight Bearing Restrictions Per Provider Order: No      Mobility  Bed Mobility Overal bed mobility: Needs Assistance Bed Mobility: Supine to Sit     Supine to sit: Supervision     General bed mobility comments: use of bed rail, rocking momentum to bring self up to EOB, deferred help from PT    Transfers Overall transfer level: Needs assistance Equipment used: Straight cane Transfers: Sit to/from Stand Sit to Stand: Contact guard assist           General transfer comment: contact guard for safety due to previous reports of dizziness and falls, pt denies dizziness at this time. pt with wide base of support, mildly unsteady but no LOB    Ambulation/Gait               General Gait Details: limited to side steps to [facility] as pt just received breakfast and was adamant about eating since he hasn't Had anything since yesterday morning.  Stairs            Wheelchair Mobility     Tilt Bed    Modified Rankin (Stroke Patients Only)       Balance Overall balance assessment: Needs assistance Sitting-balance support: Feet supported, No upper extremity supported Sitting balance-Leahy Scale: Fair Sitting balance - Comments: pt able to bend over and pull up socks   Standing balance support: Single extremity supported Standing balance-Leahy Scale: Fair Standing balance comment: pt reports several recent falls, pt reliant minimally on cane once in standing  Pertinent Vitals/Pain Pain Assessment Pain Assessment: 0-10 Pain Score: 8  Pain Location: neck and back (chronic) Pain Descriptors / Indicators: Constant Pain Intervention(s): RN gave pain meds during session (pt reports I take 17 oxycodone  a day)    Home Living Family/patient expects to be discharged to:: Private residence Living Arrangements: Other (Comment) (sister) Available Help at Discharge:  Family;Available 24 hours/day Type of Home: House Home Access: Level entry       Home Layout: One level Home Equipment: Wheelchair - Forensic psychologist (2 wheels);Cane - single point;Rollator (4 wheels) Additional Comments: not on supplemental O2 at home    Prior Function Prior Level of Function : Independent/Modified Independent             Mobility Comments: reports ambulating with cane ADLs Comments: indep, cooks, cleans, takes care of sister     Extremity/Trunk Assessment   Upper Extremity Assessment Upper Extremity Assessment: Defer to OT evaluation (pt R handed with noted decreased bilat shoulder ROM)    Lower Extremity Assessment Lower Extremity Assessment: Generalized weakness    Cervical / Trunk Assessment Cervical / Trunk Assessment: Other exceptions Cervical / Trunk Exceptions: h/o back/neck surgery, no neck ROM, limited trunk mobility as well  Communication   Communication Communication: No apparent difficulties    Cognition Arousal: Alert Behavior During Therapy: Impulsive   PT - Cognitive impairments: No family/caregiver present to determine baseline                       PT - Cognition Comments: per chart pt with memory deficits, pt impulsive with decreased insight to safety. Despite reporting freq falls and dizziness pt very upset about having bed alarm on and not being able to get up on his own reporting they blew this way out of proportion. it's a payroll thing Following commands: Intact       Cueing Cueing Techniques: Verbal cues     General Comments General comments (skin integrity, edema, etc.): pt with multiple skin tears t/o body, VSS, pt on 2LO2 via [place]    Exercises     Assessment/Plan    PT Assessment Patient needs continued PT services  PT Problem List Decreased strength;Decreased activity tolerance;Decreased balance;Decreased mobility;Decreased coordination       PT Treatment Interventions DME instruction;Gait  training;Functional mobility training;Balance training;Therapeutic activities;Therapeutic exercise    PT Goals (Current goals can be found in the Care Plan section)  Acute Rehab PT Goals Patient Stated Goal: get out of this dang place PT Goal Formulation: With patient Time For Goal Achievement: 06/11/24 Potential to Achieve Goals: Good    Frequency Min 3X/week     Co-evaluation               AM-PAC PT 6 Clicks Mobility  Outcome Measure Help needed turning from your back to your side while in a flat bed without using bedrails?: None Help needed moving from lying on your back to sitting on the side of a flat bed without using bedrails?: None Help needed moving to and from a bed to a chair (including a wheelchair)?: A Little Help needed standing up from a chair using your arms (e.g., wheelchair or bedside chair)?: A Little Help needed to walk in hospital room?: A Little Help needed climbing 3-5 steps with a railing? : A Lot 6 Click Score: 19    End of Session Equipment Utilized During Treatment: Oxygen Activity Tolerance: Other (comment) (limited by agitation and breakfast tray coming up) Patient left: in  bed;with call bell/phone within reach;with bed alarm set (sitting on EOB to eat breakfast) Nurse Communication: Mobility status PT Visit Diagnosis: Unsteadiness on feet (R26.81);Muscle weakness (generalized) (M62.81);Difficulty in walking, not elsewhere classified (R26.2)    Time: 2841-3244 PT Time Calculation (min) (ACUTE ONLY): 25 min   Charges:   PT Evaluation $PT Eval Moderate Complexity: 1 Mod PT Treatments $Therapeutic Activity: 8-22 mins PT General Charges $$ ACUTE PT VISIT: 1 Visit         Renaee Caro, PT, DPT Acute Rehabilitation Services Secure chat preferred Office #: [phone]   Jenna Moan 05/28/2024, 9:01 AM

## 2024-05-28 NOTE — Care Management Obs Status (Signed)
 MEDICARE OBSERVATION STATUS NOTIFICATION   Patient Details  Name: [name] MRN: [id number] Date of Birth: Jun 09, 1941   Medicare Observation Status Notification Given:  Yes    Ronni Colace, RN 05/28/2024, 12:36 PM

## 2024-05-29 DIAGNOSIS — J9621 Acute and chronic respiratory failure with hypoxia: Secondary | ICD-10-CM | POA: Diagnosis not present

## 2024-05-29 DIAGNOSIS — I5033 Acute on chronic diastolic (congestive) heart failure: Secondary | ICD-10-CM | POA: Diagnosis not present

## 2024-05-29 DIAGNOSIS — R42 Dizziness and giddiness: Secondary | ICD-10-CM | POA: Diagnosis not present

## 2024-05-29 LAB — CBC
HCT: 40.9 % (ref 39.0–52.0)
Hemoglobin: 13.2 g/dL (ref 13.0–17.0)
MCH: 30.3 pg (ref 26.0–34.0)
MCHC: 32.3 g/dL (ref 30.0–36.0)
MCV: 94 fL (ref 80.0–100.0)
Platelets: 198 10*3/uL (ref 150–400)
RBC: 4.35 MIL/uL (ref 4.22–5.81)
RDW: 15.5 % (ref 11.5–15.5)
WBC: 10.6 10*3/uL — ABNORMAL HIGH (ref 4.0–10.5)
nRBC: 0 % (ref 0.0–0.2)

## 2024-05-29 LAB — COMPREHENSIVE METABOLIC PANEL WITH GFR
ALT: 20 U/L (ref 0–44)
AST: 22 U/L (ref 15–41)
Albumin: 3.7 g/dL (ref 3.5–5.0)
Alkaline Phosphatase: 84 U/L (ref 38–126)
Anion gap: 12 (ref 5–15)
BUN: 35 mg/dL — ABNORMAL HIGH (ref 8–23)
CO2: 28 mmol/L (ref 22–32)
Calcium: 9.5 mg/dL (ref 8.9–10.3)
Chloride: 95 mmol/L — ABNORMAL LOW (ref 98–111)
Creatinine, Ser: 1.72 mg/dL — ABNORMAL HIGH (ref 0.61–1.24)
GFR, Estimated: 39 mL/min — ABNORMAL LOW (ref >60)
Glucose, Bld: 90 mg/dL (ref 70–99)
Potassium: 4.8 mmol/L (ref 3.5–5.1)
Sodium: 135 mmol/L (ref 135–145)
Total Bilirubin: 0.7 mg/dL (ref 0.0–1.2)
Total Protein: 7.8 g/dL (ref 6.5–8.1)

## 2024-05-29 NOTE — Progress Notes (Signed)
 Mobility Specialist: Progress Note   05/29/24 1246  Mobility  Activity Ambulated with assistance in hallway  Level of Assistance Standby assist, set-up cues, supervision of patient - no hands on  Assistive Device Front wheel walker  Distance Ambulated (ft) 200 ft  Activity Response Tolerated well  Mobility Referral Yes  Mobility visit 1 Mobility  Mobility Specialist Start Time (ACUTE ONLY) [id number]  Mobility Specialist Stop Time (ACUTE ONLY) 0946  Mobility Specialist Time Calculation (min) (ACUTE ONLY) 21 min    Pt received in bed, eager for mobility session. SV for bed mobility. CG for ambulation. Wants to get back to using his cane but preferred to use RW this session d/t feeling unsteady. No complaints throughout. SpO2 WFL on RA. Returned to room without fault. Left in chair with all needs met, call bell in reach. Chair alarm on.   Deloria Fetch Mobility Specialist Please contact via SecureChat or Rehab office at [phone]

## 2024-05-29 NOTE — Progress Notes (Signed)
 Progress Note   Patient: [name]:[id number] DOB: 1941/05/29 DOA: 05/26/2024     0 DOS: the patient was seen and examined on 05/29/2024   Brief hospital course: 83 y.o. male with medical history significant of f hypertension, CAD, chronic HFpEF, aortic stenosis status post TAVR, chronic A-fib on Eliquis , history of CVA, chronic back pain/history of multiple back surgeries, depression who presents with complaints of progressively worsening dizziness and balance issues.   He experiences dizziness and difficulty maintaining balance, which has led to falls. The dizziness is described as a lack of control over falling rather than a sensation of the room spinning. He uses a cane for assistance when walking.  Despite this patient reports that he has had several falls.   He has experienced swelling in his legs previously, describing his legs as getting 'big and water in them.'   He acknowledges having progressively worsening shortness of breath over the last couple weeks.  He has had an intermittent productive cough and  is not on oxygen  normally.  He does report having pain in his back and all over.   In the ED patient was noted to be afebrile with stable vital signs.  Labs significant for BNP 3172 and hemoglobin 12.3.  Chest x-ray noted hazy opacity along the left lateral lung base that was thought to possibly be artifactual versus developing bronchopneumonia.  CT scan of the head did not reveal any large vessel occlusion.  The brain have been ordered.  Patient had been given empiric antibiotics of Rocephin , azithromycin , oxycodone  10 mg p.o., and Lasix  40 mg IV.  Assessment and Plan: Acute on chronic respiratory failure with hypoxia secondary to congestive heart failure Pt initially required 3 L nasal cannula oxygen to maintain O2 saturations.  Normally patient not on oxygen at baseline.   BNP elevated at 3172.  -now on RA after diuresis with lasix  -Cr is up to 1.72. Hold further diuretic -  Continue beta-blocker -recheck bmet in AM   Possible pneumonia  Patient reports that he has had a mild intermittent cough but nothing significant.  Chest x-ray noted hazy opacity along the left lateral lung base that was thought to possibly be artifactual versus developing bronchopneumonia.  Patient had initially been given empiric antibiotics of Rocephin  and azithromycin .  During previous hospitalization there was concern for the possibility of aspiration. - Aspiration precautions with elevation head of bed - Procal is <0.1   Dizziness Patient presents with complaints of dizziness and feeling off balance.  Reported having several falls due to symptoms.  CT angiogram of the head and neck did not reveal any acute abnormality.    - Neurochecks - MRI reviewed, no acute process - Check orthostatic vital signs - TSH 3.421 - PT/OT was consulted   Persistent atrial fibrillation on chronic anticoagulation Patient appears to chronically be in atrial fibrillation but currently rate controlled. - Continue Cardizem  and Eliquis    CAD Elevated troponin Acute.  High-sensitivity troponin mildly elevated 27. - Trend troponin - Continue aspirin , metoprolol , statin   Normocytic anemia Hemoglobin noted to be stable at 12.3 g/dL. - Continue to monitor   Chronic pain - Continue oxycodone  and gabapentin .   Hyperlipidemia - Continue simvastatin    BPH - Continue tamsulosin        Subjective: States feeling better  Physical Exam: Vitals:   05/29/24 0748 05/29/24 0908 05/29/24 0912 05/29/24 1627  BP: 105/67 114/85 114/83 108/66  Pulse: 81 83 96 73  Resp: 18   18  Temp: 98 F (36.7 C)   98.1 F (36.7 C)  TempSrc:      SpO2: 96%   98%  Weight:      Height:       General exam: Conversant, in no acute distress Respiratory system: normal chest rise, clear, no audible wheezing Cardiovascular system: regular rhythm, s1-s2 Gastrointestinal system: Nondistended, nontender, pos BS Central  nervous system: No seizures, no tremors Extremities: No cyanosis, no joint deformities Skin: No rashes, no pallor Psychiatry: Affect normal // no auditory hallucinations   Data Reviewed:  Labs reviewed: Na 135, K 4.8, Cr 1.72, WBC 10.6, Hgb 13.2  Family Communication: Pt in room, family not at bedside  Disposition: Status is: Observation The patient remains OBS appropriate and will d/c before 2 midnights.  Planned Discharge Destination: Home     Author: Cherylle Corwin, MD 05/29/2024 5:28 PM  For on call review [email or web address].

## 2024-05-29 NOTE — Plan of Care (Signed)

## 2024-05-30 DIAGNOSIS — R42 Dizziness and giddiness: Secondary | ICD-10-CM | POA: Diagnosis not present

## 2024-05-30 DIAGNOSIS — I5033 Acute on chronic diastolic (congestive) heart failure: Secondary | ICD-10-CM | POA: Diagnosis not present

## 2024-05-30 DIAGNOSIS — J9621 Acute and chronic respiratory failure with hypoxia: Secondary | ICD-10-CM | POA: Diagnosis not present

## 2024-05-30 LAB — COMPREHENSIVE METABOLIC PANEL WITH GFR
ALT: 17 U/L (ref 0–44)
AST: 19 U/L (ref 15–41)
Albumin: 3.2 g/dL — ABNORMAL LOW (ref 3.5–5.0)
Alkaline Phosphatase: 72 U/L (ref 38–126)
Anion gap: 9 (ref 5–15)
BUN: 42 mg/dL — ABNORMAL HIGH (ref 8–23)
CO2: 30 mmol/L (ref 22–32)
Calcium: 9.2 mg/dL (ref 8.9–10.3)
Chloride: 93 mmol/L — ABNORMAL LOW (ref 98–111)
Creatinine, Ser: 1.47 mg/dL — ABNORMAL HIGH (ref 0.61–1.24)
GFR, Estimated: 48 mL/min — ABNORMAL LOW (ref >60)
Glucose, Bld: 105 mg/dL — ABNORMAL HIGH (ref 70–99)
Potassium: 4.4 mmol/L (ref 3.5–5.1)
Sodium: 132 mmol/L — ABNORMAL LOW (ref 135–145)
Total Bilirubin: 0.6 mg/dL (ref 0.0–1.2)
Total Protein: 6.9 g/dL (ref 6.5–8.1)

## 2024-05-30 LAB — CBC
HCT: 37.6 % — ABNORMAL LOW (ref 39.0–52.0)
Hemoglobin: 12.1 g/dL — ABNORMAL LOW (ref 13.0–17.0)
MCH: 30 pg (ref 26.0–34.0)
MCHC: 32.2 g/dL (ref 30.0–36.0)
MCV: 93.3 fL (ref 80.0–100.0)
Platelets: 186 10*3/uL (ref 150–400)
RBC: 4.03 MIL/uL — ABNORMAL LOW (ref 4.22–5.81)
RDW: 15.2 % (ref 11.5–15.5)
WBC: 9.6 10*3/uL (ref 4.0–10.5)
nRBC: 0 % (ref 0.0–0.2)

## 2024-05-30 NOTE — Progress Notes (Signed)
 Mobility Specialist Progress Note:   05/30/24 0910  Mobility  Activity Ambulated with assistance in hallway  Level of Assistance Contact guard assist, steadying assist  Assistive Device Front wheel walker  Distance Ambulated (ft) 120 ft  Activity Response Tolerated well  Mobility Referral Yes  Mobility visit 1 Mobility  Mobility Specialist Start Time (ACUTE ONLY) 0910  Mobility Specialist Stop Time (ACUTE ONLY) 0920  Mobility Specialist Time Calculation (min) (ACUTE ONLY) 10 min   Pt agreeable to mobility session. Required contact assist to ambulate with RW. Pt c/o back and neck pain. Left sitting in chair with all needs met.   Oneda Big Mobility Specialist Please contact via SecureChat or  Rehab office at [phone]

## 2024-05-30 NOTE — Progress Notes (Signed)
 RNCM met with patient at [facility] to follow up on [name].  Patient declines HH services at this time.  Instructed patient to contact provider if he changes his mind.

## 2024-05-30 NOTE — Plan of Care (Signed)

## 2024-05-30 NOTE — Discharge Summary (Signed)
 Physician Discharge Summary   Patient: [name] MRN: [id number] DOB: Nov 14, 1941  Admit date:     05/26/2024  Discharge date: 05/30/24  Discharge Physician: Cherylle Corwin   PCP: Allene Ivan, MD   Recommendations at discharge:    Follow up with PCP in 1-2 weeks Please note, pt was advised to resume lasix  on 6/16 and to hold off on spironolactone  to allow further improvement in renal function. Resume spironolactone  as renal function tolerates Recommend recheck BMET in 1-2 weeks  Discharge Diagnoses: Principal Problem:   Acute on chronic respiratory failure with hypoxia (HCC) Active Problems:   PNA (pneumonia)   Dizziness   Persistent atrial fibrillation (HCC)   Elevated troponin   CAD (coronary artery disease)   Normocytic anemia   Chronic pain   Hyperlipidemia   BPH (benign prostatic hyperplasia)  Resolved Problems:   * No resolved hospital problems. *  Hospital Course: 83 y.o. male with medical history significant of f hypertension, CAD, chronic HFpEF, aortic stenosis status post TAVR, chronic A-fib on Eliquis , history of CVA, chronic back pain/history of multiple back surgeries, depression who presents with complaints of progressively worsening dizziness and balance issues.   He experiences dizziness and difficulty maintaining balance, which has led to falls. The dizziness is described as a lack of control over falling rather than a sensation of the room spinning. He uses a cane for assistance when walking.  Despite this patient reports that he has had several falls.   He has experienced swelling in his legs previously, describing his legs as getting 'big and water in them.'   He acknowledges having progressively worsening shortness of breath over the last couple weeks.  He has had an intermittent productive cough and  is not on oxygen  normally.  He does report having pain in his back and all over.   In the ED patient was noted to be afebrile with stable vital signs.   Labs significant for BNP 3172 and hemoglobin 12.3.  Chest x-ray noted hazy opacity along the left lateral lung base that was thought to possibly be artifactual versus developing bronchopneumonia.  CT scan of the head did not reveal any large vessel occlusion.  The brain have been ordered.  Patient had been given empiric antibiotics of Rocephin , azithromycin , oxycodone  10 mg p.o., and Lasix  40 mg IV.  Assessment and Plan: Acute on chronic respiratory failure with hypoxia secondary to congestive heart failure Pt initially required 3 L nasal cannula oxygen to maintain O2 saturations.  Normally patient not on oxygen at baseline.   BNP elevated at 3172.  -now on RA after diuresis with lasix  -Cr peaked to 1.72, improved with holding diuretic - Continue beta-blocker -recommended holding lasix  until 6/16, and to remain off spironolactone  until renal function improves -Recommend repeat bmet in 1 week as outpatient    Possible pneumonia  Patient reports that he has had a mild intermittent cough but nothing significant.  Chest x-ray noted hazy opacity along the left lateral lung base that was thought to possibly be artifactual versus developing bronchopneumonia.  Patient had initially been given empiric antibiotics of Rocephin  and azithromycin .  During previous hospitalization there was concern for the possibility of aspiration. - Aspiration precautions with elevation head of bed - Procal is <0.1   Dizziness Patient presents with complaints of dizziness and feeling off balance.  Reported having several falls due to symptoms.  CT angiogram of the head and neck did not reveal any acute abnormality.    -  MRI reviewed, no acute process - Check orthostatic vital signs, unremarkable - TSH 3.421 - PT/OT was consulted. Recs for HHPT   Persistent atrial fibrillation on chronic anticoagulation Patient appears to chronically be in atrial fibrillation but currently rate controlled. - Continue Cardizem  and  Eliquis    CAD Elevated troponin Acute.  High-sensitivity troponin mildly elevated 27. - Trend troponin - Continue aspirin , metoprolol , statin   Normocytic anemia Hemoglobin noted to be stable at 12.3 g/dL. - Continue to monitor   Chronic pain - Continue oxycodone  and gabapentin .   Hyperlipidemia - Continue simvastatin    BPH - Continue tamsulosin        Consultants:  Procedures performed:   Disposition: Home Diet recommendation:  Cardiac diet DISCHARGE MEDICATION: Allergies as of 05/30/2024       Reactions   Ativan  [lorazepam ] Other (See Comments)   Delirium   Lyrica [pregabalin] Swelling, Other (See Comments)   Triggered PTSD, Nightmares, Aggression   Zanaflex [tizanidine Hcl] Swelling, Anxiety   Cymbalta [duloxetine Hcl] Other (See Comments)   Increased depression, Irritability         Medication List     PAUSE taking these medications    furosemide  40 MG tablet Wait to take this until: June 01, 2024 Commonly known as: LASIX  Take 1 tablet (40 mg total) by mouth daily. What changed: additional instructions       STOP taking these medications    spironolactone  25 MG tablet Commonly known as: ALDACTONE        TAKE these medications    apixaban  5 MG Tabs tablet Commonly known as: ELIQUIS  Take 5 mg by mouth 2 (two) times daily.   aspirin  EC 81 MG tablet Take 81 mg by mouth daily.   buPROPion  100 MG tablet Commonly known as: WELLBUTRIN  Take 100 mg by mouth 2 (two) times daily.   diltiazem  120 MG 24 hr capsule Commonly known as: CARDIZEM  CD Take 1 capsule (120 mg total) by mouth daily.   ferrous sulfate  325 (65 FE) MG tablet Take 325 mg by mouth daily with supper.   gabapentin  800 MG tablet Commonly known as: NEURONTIN  Take 800 mg by mouth 3 (three) times daily.   metoprolol  succinate 25 MG 24 hr tablet Commonly known as: TOPROL -XL Take 25 mg by mouth daily.   oxyCODONE -acetaminophen  10-325 MG tablet Commonly known as:  PERCOCET Take 1 tablet by mouth every 6 (six) hours as needed for pain.   pantoprazole  40 MG tablet Commonly known as: PROTONIX  Take 40 mg by mouth 2 (two) times daily.   Potassium Chloride  ER 20 MEQ Tbcr Take 20 mEq by mouth daily.   rOPINIRole  0.25 MG tablet Commonly known as: REQUIP  Take 0.25 mg by mouth 2 (two) times daily.   simvastatin  40 MG tablet Commonly known as: ZOCOR  Take 40 mg by mouth at bedtime.   sucralfate  1 g tablet Commonly known as: CARAFATE  Take 1 g by mouth 2 (two) times daily.   tamsulosin  0.4 MG Caps capsule Commonly known as: FLOMAX  Take 0.4 mg by mouth at bedtime.   topiramate  25 MG capsule Commonly known as: TOPAMAX  Take 25 mg by mouth 2 (two) times daily.        Follow-up Information     Allene Ivan, MD Follow up in 2 week(s).   Specialty: Family Medicine Why: Hospital follow up Contact information: [address] [place] [address] [phone]                Discharge Exam: Cleavon Curls  Weights   05/26/24 1644  Weight: 121.6 kg   General exam: Awake, laying in bed, in nad Respiratory system: Normal respiratory effort, no wheezing Cardiovascular system: regular rate, s1, s2 Gastrointestinal system: Soft, nondistended, positive BS Central nervous system: CN2-12 grossly intact, strength intact Extremities: Perfused, no clubbing Skin: Normal skin turgor, no notable skin lesions seen Psychiatry: Mood normal // no visual hallucinations   Condition at discharge: fair  The results of significant diagnostics from this hospitalization (including imaging, microbiology, ancillary and laboratory) are listed below for reference.   Imaging Studies: ECHOCARDIOGRAM COMPLETE Result Date: 05/28/2024    ECHOCARDIOGRAM REPORT   Patient Name:   MAXIMO SPRATLING Date of Exam: 05/28/2024 Medical Rec #:  [id number]     Height:       69.0 in Accession #:    [id number]    Weight:       268.0 lb Date of Birth:  Mar 20, 1941     BSA:           2.340 m Patient Age:    82 years      BP:           104/90 mmHg Patient Gender: M             HR:           62 bpm. Exam Location:  Inpatient Procedure: 2D Echo, Cardiac Doppler and Color Doppler (Both Spectral and Color            Flow Doppler were utilized during procedure). Indications:    R07.9* Chest pain, unspecified  History:        Patient has prior history of Echocardiogram examinations, most                 recent 01/30/2024. CHF, CAD, Abnormal ECG, Stroke, Aortic Valve                 Disease and Mitral Valve Disease, Arrythmias:Atrial                 Fibrillation, Signs/Symptoms:Dizziness/Lightheadedness; Risk                 Factors:Dyslipidemia. TAVR procedure 2019.                 Aortic Valve: 34 Medtronic CoreValve-EvolutR prosthetic, stented                 (TAVR) valve is present in the aortic position. Procedure Date:                 09/20/2018.  Sonographer:    Raynelle Callow RDCS Referring Phys: [id number] [facility]  Sonographer Comments: Suboptimal subcostal window. IMPRESSIONS  1. Left ventricular ejection fraction, by estimation, is 60 to 65%. The left ventricle has normal function. The left ventricle has no regional wall motion abnormalities. There is moderate left ventricular hypertrophy. Left ventricular diastolic parameters are indeterminate.  2. Right ventricular systolic function is normal. The right ventricular size is normal. There is normal pulmonary artery systolic pressure. The estimated right ventricular systolic pressure is 25.1 mmHg.  3. Left atrial size was mildly dilated.  4. Right atrial size was moderately dilated.  5. The mitral valve is degenerative. Mild mitral valve regurgitation. Mild mitral stenosis. Severe mitral annular calcification.  6. The aortic valve has been repaired/replaced. Aortic valve regurgitation is not visualized. No aortic stenosis is present. There is a 34 Medtronic CoreValve-EvolutR prosthetic (TAVR) valve present in the aortic position. Procedure  Date:  09/20/2018. Echo findings are consistent with normal structure and function of the aortic valve prosthesis. Aortic valve mean gradient measures 5.2 mmHg.  7. The inferior vena cava is normal in size with greater than 50% respiratory variability, suggesting right atrial pressure of 3 mmHg. Comparison(s): No significant change from prior study. FINDINGS  Left Ventricle: Left ventricular ejection fraction, by estimation, is 60 to 65%. The left ventricle has normal function. The left ventricle has no regional wall motion abnormalities. The left ventricular internal cavity size was normal in size. There is  moderate left ventricular hypertrophy. Left ventricular diastolic function could not be evaluated due to mitral annular calcification (moderate or greater). Left ventricular diastolic parameters are indeterminate. Right Ventricle: The right ventricular size is normal. No increase in right ventricular wall thickness. Right ventricular systolic function is normal. There is normal pulmonary artery systolic pressure. The tricuspid regurgitant velocity is 2.35 m/s, and  with an assumed right atrial pressure of 3 mmHg, the estimated right ventricular systolic pressure is 25.1 mmHg. Left Atrium: Left atrial size was mildly dilated. Right Atrium: Right atrial size was moderately dilated. Pericardium: There is no evidence of pericardial effusion. Presence of epicardial fat layer. Mitral Valve: The mitral valve is degenerative in appearance. There is moderate calcification of the mitral valve leaflet(s). Severe mitral annular calcification. Mild mitral valve regurgitation. Mild mitral valve stenosis. MV peak gradient, 11.3 mmHg. The mean mitral valve gradient is 4.0 mmHg. Tricuspid Valve: The tricuspid valve is grossly normal. Tricuspid valve regurgitation is mild . No evidence of tricuspid stenosis. Aortic Valve: The aortic valve has been repaired/replaced. Aortic valve regurgitation is not visualized. No aortic stenosis  is present. Aortic valve mean gradient measures 5.2 mmHg. Aortic valve peak gradient measures 10.1 mmHg. Aortic valve area, by VTI  measures 3.25 cm. There is a 34 Medtronic CoreValve-EvolutR prosthetic, stented (TAVR) valve present in the aortic position. Procedure Date: 09/20/2018. Echo findings are consistent with normal structure and function of the aortic valve prosthesis. Pulmonic Valve: The pulmonic valve was not well visualized. Pulmonic valve regurgitation is not visualized. No evidence of pulmonic stenosis. Aorta: The aortic root and ascending aorta are structurally normal, with no evidence of dilitation. Venous: The inferior vena cava is normal in size with greater than 50% respiratory variability, suggesting right atrial pressure of 3 mmHg. IAS/Shunts: No atrial level shunt detected by color flow Doppler.  LEFT VENTRICLE PLAX 2D LVIDd:         3.70 cm LVIDs:         2.50 cm LV PW:         1.30 cm LV IVS:        1.50 cm LVOT diam:     2.60 cm LV SV:         88 LV SV Index:   37 LVOT Area:     5.31 cm  LV Volumes (MOD) LV vol d, MOD A2C: 86.9 ml LV vol d, MOD A4C: 76.0 ml LV vol s, MOD A2C: 30.2 ml LV vol s, MOD A4C: 25.7 ml LV SV MOD A2C:     56.7 ml LV SV MOD A4C:     76.0 ml LV SV MOD BP:      56.5 ml RIGHT VENTRICLE             IVC RV S prime:     12.00 cm/s  IVC diam: 1.80 cm TAPSE (M-mode): 1.8 cm LEFT ATRIUM  Index        RIGHT ATRIUM           Index LA diam:        3.50 cm 1.50 cm/m   RA Area:     13.30 cm LA Vol (A2C):   56.6 ml 24.19 ml/m  RA Volume:   32.60 ml  13.93 ml/m LA Vol (A4C):   42.6 ml 18.21 ml/m LA Biplane Vol: 52.9 ml 22.61 ml/m  AORTIC VALVE AV Area (Vmax):    3.64 cm AV Area (Vmean):   3.59 cm AV Area (VTI):     3.25 cm AV Vmax:           159.00 cm/s AV Vmean:          105.500 cm/s AV VTI:            0.270 m AV Peak Grad:      10.1 mmHg AV Mean Grad:      5.2 mmHg LVOT Vmax:         109.00 cm/s LVOT Vmean:        71.300 cm/s LVOT VTI:          0.165 m LVOT/AV  VTI ratio: 0.61  AORTA Ao Root diam: 3.00 cm Ao Asc diam:  3.10 cm MITRAL VALVE                TRICUSPID VALVE MV Area (PHT): 3.99 cm     TR Peak grad:   22.1 mmHg MV Area VTI:   2.64 cm     TR Vmax:        235.00 cm/s MV Peak grad:  11.3 mmHg MV Mean grad:  4.0 mmHg     SHUNTS MV Vmax:       1.68 m/s     Systemic VTI:  0.16 m MV Vmean:      84.4 cm/s    Systemic Diam: 2.60 cm MV Decel Time: 190 msec MV E velocity: 139.00 cm/s Sheryle Donning MD Electronically signed by Sheryle Donning MD Signature Date/Time: 05/28/2024/4:50:32 PM    Final    MR BRAIN WO CONTRAST Result Date: 05/27/2024 CLINICAL DATA:  Neuro deficit, acute, stroke suspected EXAM: MRI HEAD WITHOUT CONTRAST TECHNIQUE: Multiplanar, multiecho pulse sequences of the brain and surrounding structures were obtained without intravenous contrast. COMPARISON:  CT angiogram of the head dated May 26, 2024 and MRI of the brain dated November 11, 2019. FINDINGS: Brain: Age-related cerebral volume loss. Mild-to-moderate periventricular and subcortical white matter disease. Mild encephalomalacia changes present medially within the right cerebellar hemisphere. No evidence of hemorrhage, mass or acute cortical infarction. Vascular: Normal vascular flow voids. Skull and upper cervical spine: Status post craniocervical fusion. The bone marrow appears normal in signal intensity. Sinuses/Orbits: Unremarkable. Other: Study is mildly degraded by patient motion. IMPRESSION: 1. Age-related volume loss and mild to moderate periventricular and subcortical cerebral white matter disease. Electronically Signed   By: Maribeth Shivers M.D.   On: 05/27/2024 14:08   CT ANGIO HEAD NECK W WO CM Result Date: 05/26/2024 CLINICAL DATA:  Initial evaluation for acute vertigo. EXAM: CT ANGIOGRAPHY HEAD AND NECK WITH AND WITHOUT CONTRAST TECHNIQUE: Multidetector CT imaging of the head and neck was performed using the standard protocol during bolus administration of  intravenous contrast. Multiplanar CT image reconstructions and MIPs were obtained to evaluate the vascular anatomy. Carotid stenosis measurements (when applicable) are obtained utilizing NASCET criteria, using the distal internal carotid diameter as the denominator. RADIATION DOSE REDUCTION: This exam  was performed according to the departmental dose-optimization program which includes automated exposure control, adjustment of the mA and/or kV according to patient size and/or use of iterative reconstruction technique. CONTRAST:  75mL OMNIPAQUE  IOHEXOL  350 MG/ML SOLN COMPARISON:  Prior study from 03/15/2024 FINDINGS: CT HEAD FINDINGS Brain: Streak artifact from fusion hardware at the suboccipital calvarium noted, somewhat limiting assessment of the posterior fossa. Generalized age-related cerebral atrophy with chronic small vessel ischemic disease. No acute intracranial hemorrhage. No visible acute large vessel territory infarct. No mass lesion or midline shift. Mild ventricular prominence related to global parenchymal volume loss of hydrocephalus. No extra-axial fluid collection. Vascular: No abnormal hyperdense vessel. Calcified atherosclerosis present at skull base. Skull: Scalp soft tissues demonstrate no acute finding. Calvarium intact. Prior posterior craniocervical fusion, partially visualized. Sinuses/Orbits: Globes and orbital soft tissues within normal limits. Visualized paranasal sinuses are largely clear. No mastoid effusion. Other: None. Review of the MIP images confirms the above findings CTA NECK FINDINGS Aortic arch: Visualized aortic arch within normal limits for caliber with standard branch pattern. Aortic atherosclerosis. No significant stenosis about the origin the great vessels. Right carotid system: Right common and internal carotid arteries are patent without dissection. Moderate calcified plaque about the right carotid bulb without hemodynamically significant greater than 50% stenosis. Left  carotid system: Left common and internal carotid arteries are patent without dissection. Moderate calcified plaque about the left carotid bulb with associated stenosis of up to 50% by NASCET criteria. Vertebral arteries: Both vertebral arteries arise from subclavian arteries. No significant proximal subclavian artery stenosis. Atheromatous change at the origin of the right vertebral artery with severe stenosis (series 10, image 296). Vertebral arteries otherwise patent without stenosis or dissection. Skeleton: Extensive postoperative changes present about the craniocervical junction and cervical spine. Underlying osteopenia. No worrisome osseous lesions. Other neck: No other acute finding. Upper chest: Small layering right pleural effusion. No other acute finding. Review of the MIP images confirms the above findings CTA HEAD FINDINGS Anterior circulation: Atheromatous change about the carotid siphons without hemodynamically significant stenosis. A1 segments patent bilaterally. Normal anterior communicating artery complex. Anterior cerebral arteries patent without stenosis. No M1 stenosis or occlusion. Distal MCA branches perfused and symmetric. Posterior circulation: Both V4 segments patent without stenosis. Both PICA patent at their origins. Basilar patent without stenosis. Superior cerebellar and posterior cerebral arteries patent bilaterally. Venous sinuses: Patent allowing for timing the contrast bolus. Anatomic variants: None significant.  No aneurysm. Review of the MIP images confirms the above findings IMPRESSION: CT HEAD: 1. No acute intracranial abnormality. 2. Generalized age-related cerebral atrophy with chronic small vessel ischemic disease. CTA HEAD AND NECK: 1. Negative CTA for large vessel occlusion or other emergent finding. 2. Atheromatous change about the carotid bifurcations with associated stenosis of up to 50% on the left. 3. Severe stenosis at the origin of the right vertebral artery. 4. Small  layering right pleural effusion. 5.  Aortic Atherosclerosis (ICD10-I70.0). Electronically Signed   By: Virgia Griffins M.D.   On: 05/26/2024 20:00   DG Chest 2 View Result Date: 05/26/2024 CLINICAL DATA:  chest pain , dizziness and weakness, dyspnea EXAM: CHEST - 2 VIEW COMPARISON:  Apr 24, 2024 FINDINGS: Cervical and lower thoracic fusion hardware again noted. Hazy opacity along the left lateral lung base/chest wall on the frontal radiograph. Trace pleural effusions. No pneumothorax. No cardiomegaly. Endovascular aortic valve replacement. Tortuous aorta with aortic atherosclerosis. No acute fracture or destructive lesions. Multilevel degenerative disc disease of the spine. IMPRESSION: 1. Hazy opacity along  the left lateral lung base/chest wall on the frontal radiograph. This may be artifactual from overlapping bones and soft tissue or reflect a developing bronchopneumonia, in the correct clinical context. 2. Trace pleural effusions. Electronically Signed   By: Rance Burrows M.D.   On: 05/26/2024 17:39    Microbiology: Results for orders placed or performed during the hospital encounter of 03/15/24  SARS Coronavirus 2 by RT PCR (hospital order, performed in [facility] hospital lab) *cepheid single result test* Anterior Nasal Swab     Status: None   Collection Time: 03/16/24  7:01 AM   Specimen: Anterior Nasal Swab  Result Value Ref Range Status   SARS Coronavirus 2 by RT PCR NEGATIVE NEGATIVE Final    Comment: Performed at [facility], [address] [place], [place] [address]  Respiratory (~20 pathogens) panel by PCR     Status: None   Collection Time: 03/16/24  7:01 AM   Specimen: Nasopharyngeal Swab; Respiratory  Result Value Ref Range Status   Adenovirus NOT DETECTED NOT DETECTED Final   Coronavirus 229E NOT DETECTED NOT DETECTED Final    Comment: (NOTE) The Coronavirus on the Respiratory Panel, DOES NOT test for the novel  Coronavirus (2019 nCoV)    Coronavirus HKU1 NOT  DETECTED NOT DETECTED Final   Coronavirus NL63 NOT DETECTED NOT DETECTED Final   Coronavirus OC43 NOT DETECTED NOT DETECTED Final   Metapneumovirus NOT DETECTED NOT DETECTED Final   Rhinovirus / Enterovirus NOT DETECTED NOT DETECTED Final   Influenza A NOT DETECTED NOT DETECTED Final   Influenza B NOT DETECTED NOT DETECTED Final   Parainfluenza Virus 1 NOT DETECTED NOT DETECTED Final   Parainfluenza Virus 2 NOT DETECTED NOT DETECTED Final   Parainfluenza Virus 3 NOT DETECTED NOT DETECTED Final   Parainfluenza Virus 4 NOT DETECTED NOT DETECTED Final   Respiratory Syncytial Virus NOT DETECTED NOT DETECTED Final   Bordetella pertussis NOT DETECTED NOT DETECTED Final   Bordetella Parapertussis NOT DETECTED NOT DETECTED Final   Chlamydophila pneumoniae NOT DETECTED NOT DETECTED Final   Mycoplasma pneumoniae NOT DETECTED NOT DETECTED Final    Comment: Performed at [facility], [address] [place], [place] [address]    Labs: CBC: Recent Labs  Lab 05/26/24 1648 05/28/24 0442 05/29/24 0831 05/30/24 0338  WBC 6.6 7.5 10.6* 9.6  HGB 12.3* 12.8* 13.2 12.1*  HCT 38.0* 40.6 40.9 37.6*  MCV 93.1 95.1 94.0 93.3  PLT 171 177 198 186   Basic Metabolic Panel: Recent Labs  Lab 05/26/24 1648 05/28/24 0442 05/29/24 0831 05/30/24 0338  NA 138 137 135 132*  K 4.7 4.7 4.8 4.4  CL 101 97* 95* 93*  CO2 26 30 28 30   GLUCOSE 84 92 90 105*  BUN 22 25* 35* 42*  CREATININE 1.09 1.28* 1.72* 1.47*  CALCIUM  9.8 9.2 9.5 9.2   Liver Function Tests: Recent Labs  Lab 05/26/24 1648 05/29/24 0831 05/30/24 0338  AST 38 22 19  ALT 24 20 17   ALKPHOS 94 84 72  BILITOT 0.6 0.7 0.6  PROT 7.9 7.8 6.9  ALBUMIN 4.3 3.7 3.2*   CBG: No results for input(s): GLUCAP in the last 168 hours.  Discharge time spent: less than 30 minutes.  Signed: Cherylle Corwin, MD [facility] 05/30/2024

## 2024-06-04 ENCOUNTER — Other Ambulatory Visit: Payer: Self-pay | Admitting: Primary Care

## 2024-06-18 ENCOUNTER — Emergency Department (HOSPITAL_BASED_OUTPATIENT_CLINIC_OR_DEPARTMENT_OTHER)
Admission: EM | Admit: 2024-06-18 | Discharge: 2024-06-18 | Disposition: A | Discharge status: Home / Self Care | Attending: Emergency Medicine | Admitting: Emergency Medicine

## 2024-06-18 ENCOUNTER — Other Ambulatory Visit: Payer: Self-pay

## 2024-06-18 ENCOUNTER — Emergency Department (HOSPITAL_BASED_OUTPATIENT_CLINIC_OR_DEPARTMENT_OTHER)

## 2024-06-18 DIAGNOSIS — M542 Cervicalgia: Secondary | ICD-10-CM | POA: Diagnosis not present

## 2024-06-18 DIAGNOSIS — Z7901 Long term (current) use of anticoagulants: Secondary | ICD-10-CM | POA: Diagnosis not present

## 2024-06-18 DIAGNOSIS — R079 Chest pain, unspecified: Secondary | ICD-10-CM | POA: Diagnosis present

## 2024-06-18 DIAGNOSIS — I2510 Atherosclerotic heart disease of native coronary artery without angina pectoris: Secondary | ICD-10-CM | POA: Insufficient documentation

## 2024-06-18 DIAGNOSIS — R600 Localized edema: Secondary | ICD-10-CM | POA: Insufficient documentation

## 2024-06-18 DIAGNOSIS — L03115 Cellulitis of right lower limb: Secondary | ICD-10-CM | POA: Diagnosis not present

## 2024-06-18 DIAGNOSIS — Z859 Personal history of malignant neoplasm, unspecified: Secondary | ICD-10-CM | POA: Insufficient documentation

## 2024-06-18 DIAGNOSIS — I509 Heart failure, unspecified: Secondary | ICD-10-CM | POA: Diagnosis not present

## 2024-06-18 DIAGNOSIS — I110 Hypertensive heart disease with heart failure: Secondary | ICD-10-CM | POA: Diagnosis not present

## 2024-06-18 DIAGNOSIS — Z8673 Personal history of transient ischemic attack (TIA), and cerebral infarction without residual deficits: Secondary | ICD-10-CM | POA: Diagnosis not present

## 2024-06-18 DIAGNOSIS — Z79899 Other long term (current) drug therapy: Secondary | ICD-10-CM | POA: Diagnosis not present

## 2024-06-18 DIAGNOSIS — Z7982 Long term (current) use of aspirin: Secondary | ICD-10-CM | POA: Insufficient documentation

## 2024-06-18 DIAGNOSIS — Z87891 Personal history of nicotine dependence: Secondary | ICD-10-CM | POA: Diagnosis not present

## 2024-06-18 LAB — BASIC METABOLIC PANEL WITH GFR
Anion gap: 10 (ref 5–15)
BUN: 41 mg/dL — ABNORMAL HIGH (ref 8–23)
CO2: 27 mmol/L (ref 22–32)
Calcium: 9.8 mg/dL (ref 8.9–10.3)
Chloride: 100 mmol/L (ref 98–111)
Creatinine, Ser: 1.47 mg/dL — ABNORMAL HIGH (ref 0.61–1.24)
GFR, Estimated: 48 mL/min — ABNORMAL LOW (ref >60)
Glucose, Bld: 105 mg/dL — ABNORMAL HIGH (ref 70–99)
Potassium: 4.5 mmol/L (ref 3.5–5.1)
Sodium: 136 mmol/L (ref 135–145)

## 2024-06-18 LAB — CBC
HCT: 32.4 % — ABNORMAL LOW (ref 39.0–52.0)
Hemoglobin: 10.4 g/dL — ABNORMAL LOW (ref 13.0–17.0)
MCH: 29.7 pg (ref 26.0–34.0)
MCHC: 32.1 g/dL (ref 30.0–36.0)
MCV: 92.6 fL (ref 80.0–100.0)
Platelets: 205 10*3/uL (ref 150–400)
RBC: 3.5 MIL/uL — ABNORMAL LOW (ref 4.22–5.81)
RDW: 15.9 % — ABNORMAL HIGH (ref 11.5–15.5)
WBC: 6.1 10*3/uL (ref 4.0–10.5)
nRBC: 0 % (ref 0.0–0.2)

## 2024-06-18 LAB — TROPONIN T, HIGH SENSITIVITY
Troponin T High Sensitivity: 36 ng/L — ABNORMAL HIGH (ref <19)
Troponin T High Sensitivity: 35 ng/L — ABNORMAL HIGH (ref <19)

## 2024-06-18 LAB — PRO BRAIN NATRIURETIC PEPTIDE: Pro Brain Natriuretic Peptide: 2730 pg/mL — ABNORMAL HIGH (ref <300.0)

## 2024-06-18 MED ORDER — IOHEXOL 350 MG/ML SOLN
75.0000 mL | Freq: Once | INTRAVENOUS | Status: AC | PRN
  Administered 2024-06-18: 75 mL via INTRAVENOUS

## 2024-06-18 MED ORDER — CEFADROXIL 500 MG PO CAPS: 500.0000 mg | ORAL_CAPSULE | Freq: Two times a day (BID) | ORAL | 0 refills | Status: AC

## 2024-06-18 MED ORDER — CYCLOBENZAPRINE HCL 5 MG PO TABS: 5.0000 mg | ORAL_TABLET | Freq: Two times a day (BID) | ORAL | 0 refills | Status: DC | PRN

## 2024-06-18 MED ORDER — OXYCODONE-ACETAMINOPHEN 5-325 MG PO TABS
1.0000 | ORAL_TABLET | Freq: Once | ORAL | Status: AC
  Administered 2024-06-18: 1 via ORAL
  Filled 2024-06-18: qty 1

## 2024-06-18 MED ORDER — LIDOCAINE 5 % EX PTCH: 1.0000 | MEDICATED_PATCH | Freq: Every day | CUTANEOUS | 0 refills | Status: AC | PRN

## 2024-06-18 MED ORDER — FUROSEMIDE 40 MG PO TABS: 40.0000 mg | ORAL_TABLET | Freq: Two times a day (BID) | ORAL | 1 refills | Status: DC

## 2024-06-18 NOTE — Discharge Instructions (Addendum)
 It was a pleasure caring for you today in the emergency department.  Please take the next dose of Lasix  for the next 5 days.  Follow-up with your PCP for recheck in the next week. See your cardiologist  You likely have cellulitis or infection to the skin of your right leg.  Antibiotic should improve this infection.  If symptoms worsen please return to the ER.  Please return to the emergency department for any worsening or worrisome symptoms.

## 2024-06-18 NOTE — ED Triage Notes (Signed)
 [facility] CP several days. Between shoulders as well. Swelling in legs. Denies SOB.

## 2024-06-18 NOTE — ED Provider Notes (Signed)
 [place] EMERGENCY DEPARTMENT AT [facility] Provider Note  CSN: [id number] Arrival date & time: 06/18/24 1638  Chief Complaint(s) Chest Pain  HPI [name] is a 83 y.o. male with past medical history as below, significant for atrial for relation on DOAC, aortic stenosis, spinal stenosis, CAD, BPH, CHF who presents to the ED with complaint of chest pain, neck pain  Patient reports chest pain over the last 1.5 days.  This has been worsening.  Constant sensation.  Pressure, aching sensation midsternal radiation to his neck.  Has some exertional dyspnea.  Feels his legs are more swollen than normal despite adherence to his Lasix . Redness noted to right leg. No fevers or chills, no recent travel or sick contacts, no recent falls or injuries, no abdominal pain nausea or vomiting.  Here with sister  Past Medical History Past Medical History:  Diagnosis Date   Cancer [facility])    Carpal tunnel syndrome    Cervical spondylosis with myelopathy    Chronic pain syndrome    Depression    Difficult intubation    needs smaller tube; awake oral fiberoptic scope 8.0 ETT 03/30/08   Dysphagia, pharyngeal phase    Hemorrhage of gastrointestinal tract, unspecified    History of cardiac cath    a. 03/2018 Cath: LAD and LCX with mild luminal irregularities.  No significant disease.   Iron deficiency anemia, unspecified    Lumbosacral spondylosis without myelopathy    Muscle weakness (generalized)    Other and unspecified disc disorder of cervical region    Other and unspecified hyperlipidemia    Patent foramen ovale    a. 05/2018 Echo: post-op TAVR--> + bublble study w/ L->R shunt.   Permanent atrial fibrillation (HCC)    a.  Diagnosed in the spring 2019.  Had rapid atrial fibrillation following TAVR and has been rate controlled with beta-blocker.  CHA2DS2VASc equals 7.  Supposed to be on Eliquis .   Primary localized osteoarthrosis, lower leg    Primary localized osteoarthrosis, shoulder region     Reflux esophagitis    Severe aortic stenosis    a. mild-mod AS by 01/2016 TTE, restricted AV opening with mod AR 01/2016 TEE; b. 10/2017 Echo: EF 60-65%, sev Ca2+ AoV, mean grad ; c. 05/2018 s/p TAVR ([facility]); d. 06/2018 Echo: EF 55-60%, well-seated prosth AoV, peak velocity 156cm/s. AoV gradient .   Spinal stenosis, unspecified region other than cervical    Stroke (HCC)    01/2016   Syncope and collapse    Unspecified arthropathy, lower leg    Unspecified constipation    Unspecified essential hypertension    Unspecified glaucoma(365.9)    Patient Active Problem List   Diagnosis Date Noted   Acute on chronic respiratory failure with hypoxia (HCC) 05/27/2024   Persistent atrial fibrillation (HCC) 05/27/2024   Elevated troponin 05/27/2024   CAD (coronary artery disease) 05/27/2024   Hyperlipidemia 05/27/2024   BPH (benign prostatic hyperplasia) 05/27/2024   Dizziness 05/26/2024   Aspiration pneumonia (HCC) 03/16/2024   Normocytic anemia 03/16/2024   Acute exacerbation of CHF (congestive heart failure) (HCC) 03/15/2024   Atrial fibrillation with RVR (HCC) 01/30/2024   Rib fracture 11/30/2023   Pneumothorax 11/30/2023   PNA (pneumonia) 10/27/2023   Community acquired pneumonia of left lower lobe of lung 10/26/2023   Hypotension 11/04/2021   Laceration of forehead 11/04/2021   Chronic diastolic CHF (congestive heart failure) (HCC) 11/04/2021   Renal cyst 11/04/2021   Hyperkalemia 11/04/2021   Fall 11/03/2021   Palliative care  by specialist    Goals of care, counseling/discussion    UTI (urinary tract infection) 11/10/2019   AF (paroxysmal atrial fibrillation) (HCC) 11/10/2019   Acute blood loss anemia 11/10/2019   Cellulitis 11/10/2019   AKI (acute kidney injury) (HCC) 11/10/2019   History of CVA (cerebrovascular accident) 11/10/2019   Abdominal pain 10/07/2019   Pressure injury of skin 10/07/2019   Atrial fibrillation with rapid ventricular response (HCC) 10/06/2019    Acute encephalopathy    GI bleeding 10/13/2018   Acute metabolic encephalopathy 10/13/2018   Acute renal failure (ARF) (HCC) 10/13/2018   S/P TAVR (transcatheter aortic valve replacement) 09/20/2018   Permanent atrial fibrillation (HCC) 09/20/2018   Acute on chronic diastolic CHF (congestive heart failure) (HCC) 09/18/2018   Heme positive stool 11/20/2017   Gait disorder 03/24/2013   Cervical post-laminectomy syndrome 03/26/2012   Depression 03/26/2012   Degenerative arthritis of shoulder region 03/26/2012   KNEE REPLACEMENT, BILATERAL, HX OF 06/04/2007   CELLULITIS/ABSCESS, ARM 05/05/2007   Anxiety state 04/08/2007   Chronic pain 04/08/2007   OSTEOARTHROSIS, GENERALIZED, MULTIPLE SITES 04/08/2007   SPONDYLOLISTHESIS 04/08/2007   INCONTINENCE, URGE 04/08/2007   HYPERCHOLESTEROLEMIA 03/07/2007   HYPERTENSION, BENIGN 03/07/2007   Home Medication(s) Prior to Admission medications   Medication Sig Start Date End Date Taking? Authorizing Provider  cefadroxil  (DURICEF) 500 MG capsule Take 1 capsule (500 mg total) by mouth 2 (two) times daily for 10 days. 06/18/24 06/28/24 Yes Elnor Savant A, DO  cyclobenzaprine  (FLEXERIL ) 5 MG tablet Take 1 tablet (5 mg total) by mouth 2 (two) times daily as needed for muscle spasms. 06/18/24  Yes Elnor Savant A, DO  lidocaine  (LIDODERM ) 5 % Place 1 patch onto the skin daily as needed. Remove & Discard patch within 12 hours or as directed by MD 06/18/24  Yes Elnor Savant LABOR, DO  apixaban  (ELIQUIS ) 5 MG TABS tablet Take 5 mg by mouth 2 (two) times daily.    [provider]  aspirin  EC 81 MG tablet Take 81 mg by mouth daily.    [provider]  buPROPion  (WELLBUTRIN ) 100 MG tablet Take 100 mg by mouth 2 (two) times daily.    [provider]  diltiazem  (CARDIZEM  CD) 120 MG 24 hr capsule Take 1 capsule (120 mg total) by mouth daily. 03/19/24   Fairy Frames, MD  ferrous sulfate  325 (65 FE) MG tablet Take 325 mg by mouth daily with supper.  03/15/20   [provider]  furosemide  (LASIX ) 40 MG tablet Take 1 tablet (40 mg total) by mouth 2 (two) times daily for 5 days. 06/18/24 06/23/24  Elnor Savant A, DO  gabapentin  (NEURONTIN ) 800 MG tablet Take 800 mg by mouth 3 (three) times daily. 01/02/24   [provider]  metoprolol  succinate (TOPROL -XL) 25 MG 24 hr tablet Take 25 mg by mouth daily.    [provider]  oxyCODONE -acetaminophen  (PERCOCET) 10-325 MG tablet Take 1 tablet by mouth every 6 (six) hours as needed for pain.    [provider]  pantoprazole  (PROTONIX ) 40 MG tablet Take 40 mg by mouth 2 (two) times daily.    [provider]  Potassium Chloride  ER 20 MEQ TBCR Take 20 mEq by mouth daily.    [provider]  rOPINIRole  (REQUIP ) 0.25 MG tablet Take 0.25 mg by mouth 2 (two) times daily.  10/09/18   [provider]  simvastatin  (ZOCOR ) 40 MG tablet Take 40 mg by mouth at bedtime.    [provider]  sucralfate  (  CARAFATE ) 1 g tablet Take 1 g by mouth 2 (two) times daily.    [provider]  tamsulosin  (FLOMAX ) 0.4 MG CAPS capsule Take 0.4 mg by mouth at bedtime. 09/02/19   [provider]  topiramate  (TOPAMAX ) 25 MG capsule Take 25 mg by mouth 2 (two) times daily.    [provider]                                                                                                                                    Past Surgical History Past Surgical History:  Procedure Laterality Date   BILATERAL CATARACT SURGERY  2009   DR GROAT    BIOPSY  10/14/2018   Procedure: BIOPSY;  Surgeon: Lennard Lesta FALCON, MD;  Location: [facility] ENDOSCOPY;  Service: Endoscopy;;   CARDIAC VALVE REPLACEMENT  05/2018   CERVIACAL SPINE (4X)     DR MARK ROY    COLONOSCOPY  2007   DR HENSEL    COLONOSCOPY WITH PROPOFOL  N/A 11/20/2017   Procedure: COLONOSCOPY WITH PROPOFOL ;  Surgeon: Dianna Specking, MD;  Location: [facility] ENDOSCOPY;  Service: Endoscopy;  Laterality: N/A;    ESOPHAGOGASTRODUODENOSCOPY (EGD) WITH PROPOFOL  N/A 10/14/2018   Procedure: ESOPHAGOGASTRODUODENOSCOPY (EGD) WITH PROPOFOL ;  Surgeon: Lennard Lesta FALCON, MD;  Location: [facility] ENDOSCOPY;  Service: Endoscopy;  Laterality: N/A;   JOINT REPLACEMENT     both knees   LEFT KNEE REPLACEMENT     DR ALUSIO   LEFT TRANSVERSE CARPAL LIGAMENT  01/06/2008   ROTATOR CUFF LEFT SHOULDER  2001   DR MURPHY    SHOULDER OPEN ROTATOR CUFF REPAIR  2006   DR [facility]   SPINE SURGERY     neck fusion   Family History Family History  Problem Relation Age of Onset   Cancer Mother 4       cervical cancer   Cancer Father 52       lung  cancer    Social History Social History   Tobacco Use   Smoking status: Former    Current packs/day: 0.00    Types: Cigarettes    Quit date: 08/29/1985    Years since quitting: 38.8   Smokeless tobacco: Former    Quit date: 02/12/1985  Vaping Use   Vaping status: Never Used  Substance Use Topics   Alcohol use: No   Drug use: No   Allergies Ativan  [lorazepam ], Lyrica [pregabalin], Zanaflex [tizanidine hcl], and Cymbalta [duloxetine hcl]  Review of Systems A thorough review of systems was obtained and all systems are negative except as noted in the HPI and PMH.   Physical Exam Vital Signs  I have reviewed the triage vital signs BP 123/84 (BP Location: Right Arm)   Pulse 84   Temp 98 F (36.7 C) (Oral)   Resp 20   SpO2 94%  Physical Exam Vitals and nursing note reviewed.  Constitutional:      General: He is not  in acute distress.    Appearance: He is well-developed. He is obese.  HENT:     Head: Normocephalic and atraumatic.     Right Ear: External ear normal.     Left Ear: External ear normal.     Mouth/Throat:     Mouth: Mucous membranes are moist.  Eyes:     General: No scleral icterus. Neck:     Trachea: Trachea normal.   Cardiovascular:     Rate and Rhythm: Normal rate. Rhythm irregular.     Pulses: Normal pulses.     Heart sounds: Normal heart  sounds.     No S3 or S4 sounds.  Pulmonary:     Effort: Pulmonary effort is normal. No respiratory distress.  Abdominal:     General: Abdomen is flat.     Palpations: Abdomen is soft.     Tenderness: There is no abdominal tenderness.  Musculoskeletal:     Cervical back: No rigidity or crepitus. Muscular tenderness present.     Right lower leg: Edema present.     Left lower leg: Edema present.  Skin:    General: Skin is warm and dry.     Capillary Refill: Capillary refill takes less than 2 seconds.  Neurological:     Mental Status: He is alert.  Psychiatric:        Mood and Affect: Mood normal.        Behavior: Behavior normal.     ED Results and Treatments Labs (all labs ordered are listed, but only abnormal results are displayed) Labs Reviewed  BASIC METABOLIC PANEL WITH GFR - Abnormal; Notable for the following components:      Result Value   Glucose, Bld 105 (*)    BUN 41 (*)    Creatinine, Ser 1.47 (*)    GFR, Estimated 48 (*)    All other components within normal limits  CBC - Abnormal; Notable for the following components:   RBC 3.50 (*)    Hemoglobin 10.4 (*)    HCT 32.4 (*)    RDW 15.9 (*)    All other components within normal limits  PRO BRAIN NATRIURETIC PEPTIDE - Abnormal; Notable for the following components:   Pro Brain Natriuretic Peptide 2,730.0 (*)    All other components within normal limits  TROPONIN T, HIGH SENSITIVITY - Abnormal; Notable for the following components:   Troponin T High Sensitivity 36 (*)    All other components within normal limits  TROPONIN T, HIGH SENSITIVITY - Abnormal; Notable for the following components:   Troponin T High Sensitivity 35 (*)    All other components within normal limits                                                                                                                          Radiology CT Angio Chest PE W and/or Wo Contrast Result Date: 06/18/2024 CLINICAL DATA:  Chest pain. EXAM: CT ANGIOGRAPHY  CHEST WITH CONTRAST TECHNIQUE: Multidetector CT  imaging of the chest was performed using the standard protocol during bolus administration of intravenous contrast. Multiplanar CT image reconstructions and MIPs were obtained to evaluate the vascular anatomy. RADIATION DOSE REDUCTION: This exam was performed according to the departmental dose-optimization program which includes automated exposure control, adjustment of the mA and/or kV according to patient size and/or use of iterative reconstruction technique. CONTRAST:  75mL OMNIPAQUE  IOHEXOL  350 MG/ML SOLN COMPARISON:  March 15, 2024 FINDINGS: Cardiovascular: There is mild to moderate severity calcification of the aortic arch. An artificial aortic valve is seen. The bilateral lower lobe segmental and subsegmental branches of the pulmonary arteries are limited in evaluation secondary to overlying artifact no evidence of pulmonary embolism. Normal heart size. No pericardial effusion. Mediastinum/Nodes: No enlarged mediastinal, hilar, or axillary lymph nodes. Thyroid  gland, trachea, and esophagus demonstrate no significant findings. Lungs/Pleura: Very mild atelectatic changes are seen within the posterior aspect of the left upper lobe and bilateral lung bases. No acute infiltrate, pleural effusion or pneumothorax is identified. Upper Abdomen: Surgical sutures are seen throughout the gastric region. Musculoskeletal: Postoperative changes are seen within the lower cervical spine and lower thoracic spine. Chronic posterolateral seventh, eighth and ninth left rib fractures are seen. No acute osseous abnormalities are identified. Review of the MIP images confirms the above findings. IMPRESSION: 1. No evidence of pulmonary embolism or other acute intrathoracic process. 2. Very mild left upper lobe and bilateral lower lobe atelectasis. 3. Chronic posterolateral seventh, eighth and ninth left rib fractures. 4. Aortic atherosclerosis. Electronically Signed   By: Suzen Dials M.D.   On: 06/18/2024 18:11   DG Chest Port 1 View Result Date: 06/18/2024 CLINICAL DATA:  Central chest pain and lower extremity swelling. EXAM: PORTABLE CHEST 1 VIEW COMPARISON:  May 26, 2024 FINDINGS: The cardiac silhouette is mildly enlarged and unchanged in size. An artificial aortic valve is seen. Mild the prominent interstitial lung markings are present without evidence of focal consolidation, pleural effusion or pneumothorax. Postoperative changes are seen involving the right shoulder and cervical spine with multilevel degenerative changes noted throughout the thoracic spine. IMPRESSION: Mild cardiomegaly with additional findings suggestive of mild interstitial edema. Electronically Signed   By: Suzen Dials M.D.   On: 06/18/2024 18:06   CT Cervical Spine Wo Contrast Result Date: 06/18/2024 CLINICAL DATA:  Neck pain, history of prior cervical fusion. Possible fall. EXAM: CT CERVICAL SPINE WITHOUT CONTRAST TECHNIQUE: Multidetector CT imaging of the cervical spine was performed without intravenous contrast. Multiplanar CT image reconstructions were also generated. RADIATION DOSE REDUCTION: This exam was performed according to the departmental dose-optimization program which includes automated exposure control, adjustment of the mA and/or kV according to patient size and/or use of iterative reconstruction technique. COMPARISON:  03/15/2024. FINDINGS: Alignment: Redemonstrated 9 mm anterolisthesis of C6 on C7 and 3 mm anterolisthesis of C7 on T1. Straightening of the normal cervical lordosis. No facet subluxation or dislocation. Skull base and vertebrae: Redemonstrated ACDF at C6-7. Posterior fusion extending from the occipital bones to the level of T1. There is similar diffuse fusion across the posterior elements within the cervical spine extending to the T1 level. There is osseous fusion at the C6-7 level and likely partial osseous fusion anteriorly at the C7-T1 level. Similar osseous  overgrowth along the dens and anterior arch of C1 with possible fusion at the [facility] dens articulation as well as possible fusion of the atlantooccipital and atlantoaxial articulations. There is no evidence of displaced fracture or compression fracture in the cervical spine. No destructive  osseous lesion. Soft tissues and spinal canal: No prevertebral fluid or swelling. No visible canal hematoma. Postsurgical changes within the midline posterior soft tissues. Atherosclerotic calcifications at the carotid bifurcations. Disc levels: Similar appearance of diffuse degenerative changes. There is no high-grade osseous spinal canal stenosis. Listhesis and disc osteophyte complex at C6-7 resulting in mild spinal canal stenosis. Facet arthrosis and uncovertebral hypertrophy at multiple levels. There is significant foraminal stenosis throughout the cervical spine. Upper chest: No acute finding. Other: None. IMPRESSION: No acute fracture of the cervical spine. Similar anterolisthesis at C6-7 and C7-T1. Postsurgical and degenerative changes as above. Electronically Signed   By: Donnice Mania M.D.   On: 06/18/2024 18:04    Pertinent labs & imaging results that were available during my care of the patient were reviewed by me and considered in my medical decision making (see MDM for details).  Medications Ordered in ED Medications  oxyCODONE -acetaminophen  (PERCOCET/ROXICET) 5-325 MG per tablet 1 tablet (1 tablet Oral Given 06/18/24 1729)  iohexol  (OMNIPAQUE ) 350 MG/ML injection 75 mL (75 mLs Intravenous Contrast Given 06/18/24 1739)                                                                                                                                     Procedures Procedures  (including critical care time)  Medical Decision Making / ED Course    Medical Decision Making:    SONG GARRIS is a 83 y.o. male  with past medical history as below, significant for atrial for relation on DOAC, aortic stenosis,  spinal stenosis, CAD, BPH, CHF who presents to the ED with complaint of chest pain, neck pain, exertional dyspnea. The complaint involves an extensive differential diagnosis and also carries with it a high risk of complications and morbidity.  Serious etiology was considered. Ddx includes but is not limited to: Differential includes all life-threatening causes for chest pain. This includes but is not exclusive to acute coronary syndrome, aortic dissection, pulmonary embolism, cardiac tamponade, community-acquired pneumonia, pericarditis, musculoskeletal chest wall pain, etc.   Complete initial physical exam performed, notably the patient was in no acute distress, A-fib rate controlled on telemetry.    Reviewed and confirmed nursing documentation for past medical history, family history, social history.  Vital signs reviewed.     Brief summary:  83 year old male history as above including A-fib on DOAC, aortic stenosis, CAD, CHF here with chest pain, exertional dyspnea.  Also having neck pain. Neurologically he is intact, no numbness or paresthesias to extremities.    Clinical Course as of 06/19/24 1609  Thu Jun 18, 2024  2003 Feeling better [SG]  2139 Ambu pulse ox w/o desat or increased wob [SG]  2231 Creatinine(!): 1.47 Similar prior  [SG]  2231 Troponin T High Sensitivity(!): 35 downtrending [SG]    Clinical Course User Index [SG] Elnor Savant A, DO     Acute on chronic chf> -Bnp is elev, improved from  prior -No hypoxia at rest or w/ exertion -Cxr c/w chf -Increase lasix  x5 days -F/u pcp for recheck -F/u cardiologist  Cellulitis> -RLE -not septic -duricef -f/u pcp  Acute on chronic neck pain> - takes percocet at home - feeling better after medication here - ct cervical stable - add flexeril , f/u pain mgmt  Observation was offered, patient prefers to trial outpatient management this time. Patient in no distress and overall condition is stable. Detailed discussions  were had with the patient/guardian regarding current findings, and need for close f/u with PCP or on call doctor. The patient/guardian has been instructed to return immediately if the symptoms worsen in any way for re-evaluation. Patient/guardian verbalized understanding and is in agreement with current care plan. All questions answered prior to discharge.             Additional history obtained: -Additional history obtained from family -External records from outside source obtained and reviewed including: Chart review including previous notes, labs, imaging, consultation notes including  Home medications, prior admission, primary care documentation   Lab Tests: -I ordered, reviewed, and interpreted labs.   The pertinent results include:   Labs Reviewed  BASIC METABOLIC PANEL WITH GFR - Abnormal; Notable for the following components:      Result Value   Glucose, Bld 105 (*)    BUN 41 (*)    Creatinine, Ser 1.47 (*)    GFR, Estimated 48 (*)    All other components within normal limits  CBC - Abnormal; Notable for the following components:   RBC 3.50 (*)    Hemoglobin 10.4 (*)    HCT 32.4 (*)    RDW 15.9 (*)    All other components within normal limits  PRO BRAIN NATRIURETIC PEPTIDE - Abnormal; Notable for the following components:   Pro Brain Natriuretic Peptide 2,730.0 (*)    All other components within normal limits  TROPONIN T, HIGH SENSITIVITY - Abnormal; Notable for the following components:   Troponin T High Sensitivity 36 (*)    All other components within normal limits  TROPONIN T, HIGH SENSITIVITY - Abnormal; Notable for the following components:   Troponin T High Sensitivity 35 (*)    All other components within normal limits    Notable for as above  EKG   EKG Interpretation Date/Time:    Ventricular Rate:    PR Interval:    QRS Duration:    QT Interval:    QTC Calculation:   R Axis:      Text Interpretation:           Imaging Studies  ordered: I ordered imaging studies including CT PE, chest x-ray I independently visualized the following imaging with scope of interpretation limited to determining acute life threatening conditions related to emergency care; findings noted above I agree with the radiologist interpretation If any imaging was obtained with contrast I closely monitored patient for any possible adverse reaction a/w contrast administration in the emergency department   Medicines ordered and prescription drug management: Meds ordered this encounter  Medications   oxyCODONE -acetaminophen  (PERCOCET/ROXICET) 5-325 MG per tablet 1 tablet    Refill:  0   iohexol  (OMNIPAQUE ) 350 MG/ML injection 75 mL   cefadroxil  (DURICEF) 500 MG capsule    Sig: Take 1 capsule (500 mg total) by mouth 2 (two) times daily for 10 days.    Dispense:  20 capsule    Refill:  0   furosemide  (LASIX ) 40 MG tablet    Sig: Take 1  tablet (40 mg total) by mouth 2 (two) times daily for 5 days.    Dispense:  90 tablet    Refill:  1   cyclobenzaprine  (FLEXERIL ) 5 MG tablet    Sig: Take 1 tablet (5 mg total) by mouth 2 (two) times daily as needed for muscle spasms.    Dispense:  10 tablet    Refill:  0   lidocaine  (LIDODERM ) 5 %    Sig: Place 1 patch onto the skin daily as needed. Remove & Discard patch within 12 hours or as directed by MD    Dispense:  15 patch    Refill:  0    -I have reviewed the patients home medicines and have made adjustments as needed   Consultations Obtained: Not applicable  Cardiac Monitoring: The patient was maintained on a cardiac monitor.  I personally viewed and interpreted the cardiac monitored which showed an underlying rhythm of: NSR Continuous pulse oximetry interpreted by myself, 94% on RA.    Social Determinants of Health:  Diagnosis or treatment significantly limited by social determinants of health: former smoker   Reevaluation: After the interventions noted above, I reevaluated the patient and  found that they have improved  Co morbidities that complicate the patient evaluation  Past Medical History:  Diagnosis Date   Cancer [facility])    Carpal tunnel syndrome    Cervical spondylosis with myelopathy    Chronic pain syndrome    Depression    Difficult intubation    needs smaller tube; awake oral fiberoptic scope 8.0 ETT 03/30/08   Dysphagia, pharyngeal phase    Hemorrhage of gastrointestinal tract, unspecified    History of cardiac cath    a. 03/2018 Cath: LAD and LCX with mild luminal irregularities.  No significant disease.   Iron deficiency anemia, unspecified    Lumbosacral spondylosis without myelopathy    Muscle weakness (generalized)    Other and unspecified disc disorder of cervical region    Other and unspecified hyperlipidemia    Patent foramen ovale    a. 05/2018 Echo: post-op TAVR--> + bublble study w/ L->R shunt.   Permanent atrial fibrillation (HCC)    a.  Diagnosed in the spring 2019.  Had rapid atrial fibrillation following TAVR and has been rate controlled with beta-blocker.  CHA2DS2VASc equals 7.  Supposed to be on Eliquis .   Primary localized osteoarthrosis, lower leg    Primary localized osteoarthrosis, shoulder region    Reflux esophagitis    Severe aortic stenosis    a. mild-mod AS by 01/2016 TTE, restricted AV opening with mod AR 01/2016 TEE; b. 10/2017 Echo: EF 60-65%, sev Ca2+ AoV, mean grad ; c. 05/2018 s/p TAVR ([facility]); d. 06/2018 Echo: EF 55-60%, well-seated prosth AoV, peak velocity 156cm/s. AoV gradient .   Spinal stenosis, unspecified region other than cervical    Stroke (HCC)    01/2016   Syncope and collapse    Unspecified arthropathy, lower leg    Unspecified constipation    Unspecified essential hypertension    Unspecified glaucoma(365.9)       Dispostion: Disposition decision including need for hospitalization was considered, and patient discharged from emergency department.    Final Clinical Impression(s) / ED Diagnoses Final  diagnoses:  Chronic congestive heart failure, unspecified heart failure type (HCC)  Cellulitis of right lower extremity  Neck pain        Elnor Jayson LABOR, DO 06/19/24 1609

## 2024-06-18 NOTE — ED Notes (Signed)
 RT ambulated pt in hall w/sats of 94 preambulation, during ambulation pts sats remained between 92-94% on RA. Pt respiratory status stable on RA w/some dyspnea post ambulation which pt states is normal for him.    06/18/24 2024  Therapy Vitals  Pulse Rate 86  Resp (!) 22  Patient Position (if appropriate) Sitting  MEWS Score/Color  MEWS Score 1  MEWS Score Color Green  Respiratory Assessment  Assessment Type Assess only  Respiratory Pattern Regular;Dyspnea with exertion;Dyspnea at rest;Symmetrical;Tachypnea  Chest Assessment Chest expansion symmetrical  Cough None  Bilateral Breath Sounds Diminished  R Upper  Breath Sounds Diminished  L Upper Breath Sounds Diminished  R Lower Breath Sounds Diminished  L Lower Breath Sounds Diminished  Oxygen Therapy/Pulse Ox  O2 Device Room Air  O2 Therapy Room air  FiO2 (%) 21 %  SpO2 94 %

## 2024-06-25 ENCOUNTER — Other Ambulatory Visit: Payer: Self-pay

## 2024-06-25 ENCOUNTER — Emergency Department (HOSPITAL_COMMUNITY)

## 2024-06-25 ENCOUNTER — Emergency Department (HOSPITAL_COMMUNITY)
Admission: EM | Admit: 2024-06-25 | Discharge: 2024-06-26 | Disposition: A | Discharge status: Home / Self Care | Attending: Emergency Medicine | Admitting: Emergency Medicine

## 2024-06-25 DIAGNOSIS — R531 Weakness: Secondary | ICD-10-CM | POA: Diagnosis not present

## 2024-06-25 DIAGNOSIS — Z7982 Long term (current) use of aspirin: Secondary | ICD-10-CM | POA: Diagnosis not present

## 2024-06-25 DIAGNOSIS — W07XXXA Fall from chair, initial encounter: Secondary | ICD-10-CM | POA: Insufficient documentation

## 2024-06-25 DIAGNOSIS — Z7901 Long term (current) use of anticoagulants: Secondary | ICD-10-CM | POA: Diagnosis not present

## 2024-06-25 DIAGNOSIS — I2510 Atherosclerotic heart disease of native coronary artery without angina pectoris: Secondary | ICD-10-CM | POA: Insufficient documentation

## 2024-06-25 DIAGNOSIS — I509 Heart failure, unspecified: Secondary | ICD-10-CM | POA: Diagnosis present

## 2024-06-25 NOTE — ED Provider Notes (Signed)
 [place] EMERGENCY DEPARTMENT AT [facility] Provider Note   CSN: [id number] Arrival date & time: 06/25/24  2310     Patient presents with: [name] is a 83 y.o. male.   83 year old male who presents the ER today secondary to a fall.  He has a history ofatrial fibrillation on DOAC, aortic stenosis, spinal stenosis, CAD, BPH, CHF.  Was just seen here about a week ago for CHF exacerbation.  Admitted prior to that.  Patient states that he felt because he was trying to sit on a chair but it was not where he thought it was.  Does not really complain any pain at this time however he told nursing earlier that he had neck and back pain.  Patient's legs are significantly swollen right greater than left he states this is his baseline.  Has multiple wounds in various stages of healing and ecchymosis on his legs and arms but does not totally undressed yet.        Prior to Admission medications   Medication Sig Start Date End Date Taking? Authorizing Provider  apixaban  (ELIQUIS ) 5 MG TABS tablet Take 5 mg by mouth 2 (two) times daily.    [provider]  aspirin  EC 81 MG tablet Take 81 mg by mouth daily.    [provider]  buPROPion  (WELLBUTRIN ) 100 MG tablet Take 100 mg by mouth 2 (two) times daily.    [provider]  cyclobenzaprine  (FLEXERIL ) 5 MG tablet Take 1 tablet (5 mg total) by mouth 2 (two) times daily as needed for muscle spasms. 06/18/24   Elnor Jayson LABOR, DO  diltiazem  (CARDIZEM  CD) 120 MG 24 hr capsule Take 1 capsule (120 mg total) by mouth daily. 03/19/24   Fairy Frames, MD  ferrous sulfate  325 (65 FE) MG tablet Take 325 mg by mouth daily with supper. 03/15/20   [provider]  furosemide  (LASIX ) 40 MG tablet Take 1 tablet (40 mg total) by mouth 2 (two) times daily for 5 days. 06/18/24 06/23/24  Elnor Jayson LABOR, DO  gabapentin  (NEURONTIN ) 800 MG tablet Take 800 mg by mouth 3 (three) times daily. 01/02/24   [provider]   lidocaine  (LIDODERM ) 5 % Place 1 patch onto the skin daily as needed. Remove & Discard patch within 12 hours or as directed by MD 06/18/24   Elnor Jayson LABOR, DO  metoprolol  succinate (TOPROL -XL) 25 MG 24 hr tablet Take 25 mg by mouth daily.    [provider]  oxyCODONE -acetaminophen  (PERCOCET) 10-325 MG tablet Take 1 tablet by mouth every 6 (six) hours as needed for pain.    [provider]  pantoprazole  (PROTONIX ) 40 MG tablet Take 40 mg by mouth 2 (two) times daily.    [provider]  Potassium Chloride  ER 20 MEQ TBCR Take 20 mEq by mouth daily.    [provider]  rOPINIRole  (REQUIP ) 0.25 MG tablet Take 0.25 mg by mouth 2 (two) times daily.  10/09/18   [provider]  simvastatin  (ZOCOR ) 40 MG tablet Take 40 mg by mouth at bedtime.    [provider]  sucralfate  (CARAFATE ) 1 g tablet Take 1 g by mouth 2 (two) times daily.    [provider]  tamsulosin  (FLOMAX ) 0.4 MG CAPS capsule Take 0.4 mg by mouth at bedtime. 09/02/19   [provider]  topiramate  (TOPAMAX ) 25 MG capsule Take 25 mg by mouth 2 (two) times daily.    [provider]  Allergies: Ativan  [lorazepam ], Lyrica [pregabalin], Zanaflex [tizanidine hcl], and Cymbalta [duloxetine hcl]    Review of Systems  Updated Vital Signs BP 129/85   Pulse 76   Temp 97.7 F (36.5 C) (Oral)   Resp (!) 25   SpO2 99%   Physical Exam Vitals and nursing note reviewed.  Constitutional:      Appearance: He is well-developed.  HENT:     Head: Normocephalic and atraumatic.     Nose: No congestion or rhinorrhea.     Mouth/Throat:     Mouth: Mucous membranes are dry.  Eyes:     Pupils: Pupils are equal, round, and reactive to light.  Cardiovascular:     Rate and Rhythm: Normal rate.  Pulmonary:     Effort: Pulmonary effort is normal. No respiratory distress.  Abdominal:     General: There is no distension.  Musculoskeletal:        General: Normal range of  motion.     Cervical back: Normal range of motion.     Comments: Patient's legs are significantly swollen right greater than left he states this is his baseline.    Skin:    Comments: Has multiple wounds in various stages of healing and ecchymosis on his legs and arms but does not totally undressed yet.  Neurological:     Mental Status: He is alert.     Comments: Slow to respond otherwise seems to be at baseline     (all labs ordered are listed, but only abnormal results are displayed) Labs Reviewed  CBC WITH DIFFERENTIAL/PLATELET - Abnormal; Notable for the following components:      Result Value   RBC 3.65 (*)    Hemoglobin 10.8 (*)    HCT 35.1 (*)    RDW 16.1 (*)    All other components within normal limits  COMPREHENSIVE METABOLIC PANEL WITH GFR - Abnormal; Notable for the following components:   Glucose, Bld 107 (*)    BUN 32 (*)    Creatinine, Ser 1.51 (*)    Albumin 3.4 (*)    AST 53 (*)    GFR, Estimated 46 (*)    All other components within normal limits  BRAIN NATRIURETIC PEPTIDE - Abnormal; Notable for the following components:   B Natriuretic Peptide 823.4 (*)    All other components within normal limits  URINALYSIS, ROUTINE W REFLEX MICROSCOPIC - Abnormal; Notable for the following components:   Glucose, UA >=500 (*)    Hgb urine dipstick SMALL (*)    All other components within normal limits  MAGNESIUM   TROPONIN I (HIGH SENSITIVITY)    EKG: EKG Interpretation Date/Time:  Thursday June 25 2024 23:52:21 EDT Ventricular Rate:  78 PR Interval:    QRS Duration:  94 QT Interval:  385 QTC Calculation: 447 R Axis:   77  Text Interpretation: Atrial fibrillation Low voltage, extremity leads Minimal ST depression Borderline ST elevation, anterolateral leads Confirmed by Lorette Mayo [phone]) on 06/26/2024 12:38:29 AM  Radiology: No results found.   Procedures   Medications Ordered in the ED  furosemide  (LASIX ) injection 80 mg (80 mg Intravenous Given  06/26/24 0159)                                    Medical Decision Making Amount and/or Complexity of Data Reviewed Labs: ordered. Radiology: ordered. ECG/medicine tests: ordered.  Risk Prescription drug management.   Initiate evaluation for weakness  and the lower extremity edema so do a cardiac workup as well.  Disposition pending on results.  Results consistent with acute on chronic heart failure.  He was diuresed in the ER with significant output.  He was able to ambulate on pulse ox without too much difficulty breathing and states he feels close sign.  Will increase his Lasix  at home with close outpatient follow-up..     Final diagnoses:  Acute on chronic congestive heart failure, unspecified heart failure type [facility])    ED Discharge Orders     None          Danetra Glock, Selinda, MD 07/07/24 [phone]

## 2024-06-25 NOTE — ED Triage Notes (Addendum)
 Pt presents from home with c/o falling while trying to sit in a chair around 3pm. Fire arrived to help pt get into the chair. Family member called EMS after pt was noted to have increased drowsiness and weakness. Pt reports difficulty walking because his legs are swollen. Pt on lasix  and reports compliance with it. EMS reports O2 sat of 89% when pt falls asleep. Pt denies any pain. Family member told EMS pt did not hit his head. NIHSS 0. Airway intact and pt alert to self and place. States year is 25. Pt is on a blood thinner.

## 2024-06-26 LAB — CBC WITH DIFFERENTIAL/PLATELET
Abs Immature Granulocytes: 0.02 K/uL (ref 0.00–0.07)
Basophils Absolute: 0 K/uL (ref 0.0–0.1)
Basophils Relative: 1 %
Eosinophils Absolute: 0.3 K/uL (ref 0.0–0.5)
Eosinophils Relative: 4 %
HCT: 35.1 % — ABNORMAL LOW (ref 39.0–52.0)
Hemoglobin: 10.8 g/dL — ABNORMAL LOW (ref 13.0–17.0)
Immature Granulocytes: 0 %
Lymphocytes Relative: 14 %
Lymphs Abs: 0.9 K/uL (ref 0.7–4.0)
MCH: 29.6 pg (ref 26.0–34.0)
MCHC: 30.8 g/dL (ref 30.0–36.0)
MCV: 96.2 fL (ref 80.0–100.0)
Monocytes Absolute: 0.8 K/uL (ref 0.1–1.0)
Monocytes Relative: 13 %
Neutro Abs: 4.6 K/uL (ref 1.7–7.7)
Neutrophils Relative %: 68 %
Platelets: 180 K/uL (ref 150–400)
RBC: 3.65 MIL/uL — ABNORMAL LOW (ref 4.22–5.81)
RDW: 16.1 % — ABNORMAL HIGH (ref 11.5–15.5)
WBC: 6.6 K/uL (ref 4.0–10.5)
nRBC: 0 % (ref 0.0–0.2)

## 2024-06-26 LAB — TROPONIN I (HIGH SENSITIVITY): Troponin I (High Sensitivity): 11 ng/L (ref <18)

## 2024-06-26 LAB — URINALYSIS, ROUTINE W REFLEX MICROSCOPIC
Bacteria, UA: NONE SEEN
Bilirubin Urine: NEGATIVE
Glucose, UA: >=500 mg/dL — AB
Ketones, ur: NEGATIVE mg/dL
Leukocytes,Ua: NEGATIVE
Nitrite: NEGATIVE
Protein, ur: NEGATIVE mg/dL
Specific Gravity, Urine: 1.006 (ref 1.005–1.030)
pH: 5 (ref 5.0–8.0)

## 2024-06-26 LAB — COMPREHENSIVE METABOLIC PANEL WITH GFR
ALT: 23 U/L (ref 0–44)
AST: 53 U/L — ABNORMAL HIGH (ref 15–41)
Albumin: 3.4 g/dL — ABNORMAL LOW (ref 3.5–5.0)
Alkaline Phosphatase: 84 U/L (ref 38–126)
Anion gap: 6 (ref 5–15)
BUN: 32 mg/dL — ABNORMAL HIGH (ref 8–23)
CO2: 26 mmol/L (ref 22–32)
Calcium: 9.2 mg/dL (ref 8.9–10.3)
Chloride: 104 mmol/L (ref 98–111)
Creatinine, Ser: 1.51 mg/dL — ABNORMAL HIGH (ref 0.61–1.24)
GFR, Estimated: 46 mL/min — ABNORMAL LOW (ref >60)
Glucose, Bld: 107 mg/dL — ABNORMAL HIGH (ref 70–99)
Potassium: 4.2 mmol/L (ref 3.5–5.1)
Sodium: 136 mmol/L (ref 135–145)
Total Bilirubin: 0.6 mg/dL (ref 0.0–1.2)
Total Protein: 7.4 g/dL (ref 6.5–8.1)

## 2024-06-26 LAB — MAGNESIUM: Magnesium: 2.4 mg/dL (ref 1.7–2.4)

## 2024-06-26 LAB — BRAIN NATRIURETIC PEPTIDE: B Natriuretic Peptide: 823.4 pg/mL — ABNORMAL HIGH (ref 0.0–100.0)

## 2024-06-26 MED ORDER — FUROSEMIDE 10 MG/ML IJ SOLN
80.0000 mg | Freq: Once | INTRAMUSCULAR | Status: AC
  Administered 2024-06-26: 80 mg via INTRAVENOUS
  Filled 2024-06-26: qty 8

## 2024-06-26 NOTE — Discharge Instructions (Addendum)
 Double your furosemide  for the next 5 days and follow up with your heart fialure doctor or your pcp for recheck.

## 2024-06-26 NOTE — ED Notes (Signed)
 [name] called to pick pt up. Said she would be on the way. Pt has hx of dementia so can not sit in the lobby

## 2024-06-26 NOTE — ED Notes (Signed)
 Pt ambulated with help of staff. Pt reports that he usually walks with a cane

## 2024-07-14 ENCOUNTER — Encounter: Payer: Self-pay | Admitting: Primary Care

## 2024-08-05 ENCOUNTER — Encounter: Admitting: Pulmonary Disease

## 2024-08-05 ENCOUNTER — Encounter: Payer: Self-pay | Admitting: Pulmonary Disease

## 2024-08-16 ENCOUNTER — Other Ambulatory Visit: Payer: Self-pay

## 2024-08-16 ENCOUNTER — Emergency Department (HOSPITAL_COMMUNITY)

## 2024-08-16 ENCOUNTER — Encounter (HOSPITAL_COMMUNITY): Payer: Self-pay | Admitting: Emergency Medicine

## 2024-08-16 ENCOUNTER — Observation Stay (HOSPITAL_COMMUNITY)
Admission: EM | Admit: 2024-08-16 | Discharge: 2024-08-18 | Disposition: A | Discharge status: Home / Self Care | Attending: Internal Medicine | Admitting: Internal Medicine

## 2024-08-16 DIAGNOSIS — R531 Weakness: Secondary | ICD-10-CM

## 2024-08-16 DIAGNOSIS — Z859 Personal history of malignant neoplasm, unspecified: Secondary | ICD-10-CM | POA: Insufficient documentation

## 2024-08-16 DIAGNOSIS — R072 Precordial pain: Principal | ICD-10-CM | POA: Insufficient documentation

## 2024-08-16 DIAGNOSIS — R079 Chest pain, unspecified: Secondary | ICD-10-CM | POA: Diagnosis present

## 2024-08-16 DIAGNOSIS — Z7901 Long term (current) use of anticoagulants: Secondary | ICD-10-CM | POA: Insufficient documentation

## 2024-08-16 DIAGNOSIS — Z87891 Personal history of nicotine dependence: Secondary | ICD-10-CM | POA: Diagnosis not present

## 2024-08-16 DIAGNOSIS — E785 Hyperlipidemia, unspecified: Secondary | ICD-10-CM | POA: Diagnosis not present

## 2024-08-16 DIAGNOSIS — N390 Urinary tract infection, site not specified: Secondary | ICD-10-CM | POA: Insufficient documentation

## 2024-08-16 DIAGNOSIS — Z1152 Encounter for screening for COVID-19: Secondary | ICD-10-CM | POA: Diagnosis not present

## 2024-08-16 DIAGNOSIS — I4821 Permanent atrial fibrillation: Secondary | ICD-10-CM | POA: Diagnosis not present

## 2024-08-16 DIAGNOSIS — Z7982 Long term (current) use of aspirin: Secondary | ICD-10-CM | POA: Diagnosis not present

## 2024-08-16 DIAGNOSIS — E876 Hypokalemia: Secondary | ICD-10-CM | POA: Diagnosis not present

## 2024-08-16 DIAGNOSIS — G8929 Other chronic pain: Secondary | ICD-10-CM | POA: Insufficient documentation

## 2024-08-16 DIAGNOSIS — N400 Enlarged prostate without lower urinary tract symptoms: Secondary | ICD-10-CM | POA: Insufficient documentation

## 2024-08-16 DIAGNOSIS — Z9861 Coronary angioplasty status: Secondary | ICD-10-CM | POA: Diagnosis not present

## 2024-08-16 DIAGNOSIS — I5032 Chronic diastolic (congestive) heart failure: Secondary | ICD-10-CM | POA: Diagnosis not present

## 2024-08-16 DIAGNOSIS — R0602 Shortness of breath: Secondary | ICD-10-CM | POA: Diagnosis not present

## 2024-08-16 DIAGNOSIS — Z79899 Other long term (current) drug therapy: Secondary | ICD-10-CM | POA: Diagnosis not present

## 2024-08-16 DIAGNOSIS — I110 Hypertensive heart disease with heart failure: Principal | ICD-10-CM | POA: Insufficient documentation

## 2024-08-16 DIAGNOSIS — I2510 Atherosclerotic heart disease of native coronary artery without angina pectoris: Secondary | ICD-10-CM | POA: Insufficient documentation

## 2024-08-16 LAB — RESP PANEL BY RT-PCR (RSV, FLU A&B, COVID)  RVPGX2
Influenza A by PCR: NEGATIVE
Influenza B by PCR: NEGATIVE
Resp Syncytial Virus by PCR: NEGATIVE
SARS Coronavirus 2 by RT PCR: NEGATIVE

## 2024-08-16 LAB — TROPONIN I (HIGH SENSITIVITY)
Troponin I (High Sensitivity): 10 ng/L (ref <18)
Troponin I (High Sensitivity): 12 ng/L (ref <18)

## 2024-08-16 LAB — URINALYSIS, ROUTINE W REFLEX MICROSCOPIC
Bilirubin Urine: NEGATIVE
Glucose, UA: NEGATIVE mg/dL
Ketones, ur: NEGATIVE mg/dL
Nitrite: POSITIVE — AB
Protein, ur: 30 mg/dL — AB
Specific Gravity, Urine: 1.011 (ref 1.005–1.030)
WBC, UA: >50 WBC/hpf (ref 0–5)
pH: 7 (ref 5.0–8.0)

## 2024-08-16 LAB — CBC
HCT: 41 % (ref 39.0–52.0)
Hemoglobin: 13.4 g/dL (ref 13.0–17.0)
MCH: 29.6 pg (ref 26.0–34.0)
MCHC: 32.7 g/dL (ref 30.0–36.0)
MCV: 90.7 fL (ref 80.0–100.0)
Platelets: 245 K/uL (ref 150–400)
RBC: 4.52 MIL/uL (ref 4.22–5.81)
RDW: 16.9 % — ABNORMAL HIGH (ref 11.5–15.5)
WBC: 11.4 K/uL — ABNORMAL HIGH (ref 4.0–10.5)
nRBC: 0 % (ref 0.0–0.2)

## 2024-08-16 LAB — COMPREHENSIVE METABOLIC PANEL WITH GFR
ALT: 18 U/L (ref 0–44)
AST: 24 U/L (ref 15–41)
Albumin: 3.6 g/dL (ref 3.5–5.0)
Alkaline Phosphatase: 100 U/L (ref 38–126)
Anion gap: 15 (ref 5–15)
BUN: 26 mg/dL — ABNORMAL HIGH (ref 8–23)
CO2: 33 mmol/L — ABNORMAL HIGH (ref 22–32)
Calcium: 9.6 mg/dL (ref 8.9–10.3)
Chloride: 87 mmol/L — ABNORMAL LOW (ref 98–111)
Creatinine, Ser: 1.13 mg/dL (ref 0.61–1.24)
GFR, Estimated: >60 mL/min (ref >60)
Glucose, Bld: 120 mg/dL — ABNORMAL HIGH (ref 70–99)
Potassium: 2.7 mmol/L — CL (ref 3.5–5.1)
Sodium: 135 mmol/L (ref 135–145)
Total Bilirubin: 1 mg/dL (ref 0.0–1.2)
Total Protein: 8.6 g/dL — ABNORMAL HIGH (ref 6.5–8.1)

## 2024-08-16 LAB — BRAIN NATRIURETIC PEPTIDE: B Natriuretic Peptide: 346.2 pg/mL — ABNORMAL HIGH (ref 0.0–100.0)

## 2024-08-16 LAB — MAGNESIUM: Magnesium: 2.1 mg/dL (ref 1.7–2.4)

## 2024-08-16 MED ORDER — TAMSULOSIN HCL 0.4 MG PO CAPS
0.4000 mg | ORAL_CAPSULE | Freq: Every day | ORAL | Status: DC
  Administered 2024-08-16 – 2024-08-17 (×2): 0.4 mg via ORAL
  Filled 2024-08-16 (×2): qty 1

## 2024-08-16 MED ORDER — ASPIRIN 81 MG PO TBEC
81.0000 mg | DELAYED_RELEASE_TABLET | Freq: Every day | ORAL | Status: DC
  Administered 2024-08-16 – 2024-08-18 (×3): 81 mg via ORAL
  Filled 2024-08-16 (×3): qty 1

## 2024-08-16 MED ORDER — ACETAMINOPHEN 650 MG RE SUPP: 650.0000 mg | Freq: Four times a day (QID) | RECTAL | Status: DC | PRN

## 2024-08-16 MED ORDER — ACETAMINOPHEN 325 MG PO TABS: 650.0000 mg | ORAL_TABLET | Freq: Four times a day (QID) | ORAL | Status: DC | PRN

## 2024-08-16 MED ORDER — TOPIRAMATE 25 MG PO TABS
25.0000 mg | ORAL_TABLET | Freq: Two times a day (BID) | ORAL | Status: DC
  Administered 2024-08-16 – 2024-08-18 (×4): 25 mg via ORAL
  Filled 2024-08-16 (×4): qty 1

## 2024-08-16 MED ORDER — ACETAMINOPHEN 500 MG PO TABS
1000.0000 mg | ORAL_TABLET | Freq: Once | ORAL | Status: AC
  Administered 2024-08-16: 1000 mg via ORAL
  Filled 2024-08-16: qty 2

## 2024-08-16 MED ORDER — FAMOTIDINE 20 MG PO TABS
20.0000 mg | ORAL_TABLET | Freq: Once | ORAL | Status: AC
  Administered 2024-08-16: 20 mg via ORAL
  Filled 2024-08-16: qty 1

## 2024-08-16 MED ORDER — SODIUM CHLORIDE 0.9 % IV SOLN
2.0000 g | Freq: Once | INTRAVENOUS | Status: AC
  Administered 2024-08-16: 2 g via INTRAVENOUS
  Filled 2024-08-16: qty 20

## 2024-08-16 MED ORDER — BUPROPION HCL 100 MG PO TABS
100.0000 mg | ORAL_TABLET | Freq: Two times a day (BID) | ORAL | Status: DC
  Administered 2024-08-16 – 2024-08-18 (×3): 100 mg via ORAL
  Filled 2024-08-16 (×4): qty 1

## 2024-08-16 MED ORDER — FERROUS SULFATE 325 (65 FE) MG PO TABS
325.0000 mg | ORAL_TABLET | Freq: Every day | ORAL | Status: DC
  Administered 2024-08-16 – 2024-08-17 (×2): 325 mg via ORAL
  Filled 2024-08-16 (×2): qty 1

## 2024-08-16 MED ORDER — METOPROLOL SUCCINATE ER 25 MG PO TB24
25.0000 mg | ORAL_TABLET | Freq: Every day | ORAL | Status: DC
  Administered 2024-08-16 – 2024-08-18 (×3): 25 mg via ORAL
  Filled 2024-08-16 (×3): qty 1

## 2024-08-16 MED ORDER — ONDANSETRON HCL 4 MG PO TABS: 4.0000 mg | ORAL_TABLET | Freq: Four times a day (QID) | ORAL | Status: DC | PRN

## 2024-08-16 MED ORDER — BUPRENORPHINE HCL 2 MG SL SUBL
2.0000 mg | SUBLINGUAL_TABLET | Freq: Three times a day (TID) | SUBLINGUAL | Status: DC
  Administered 2024-08-16 – 2024-08-18 (×4): 2 mg via SUBLINGUAL
  Filled 2024-08-16 (×5): qty 1

## 2024-08-16 MED ORDER — ALUM & MAG HYDROXIDE-SIMETH 200-200-20 MG/5ML PO SUSP
30.0000 mL | Freq: Once | ORAL | Status: AC
  Administered 2024-08-16: 30 mL via ORAL
  Filled 2024-08-16: qty 30

## 2024-08-16 MED ORDER — ONDANSETRON HCL 4 MG/2ML IJ SOLN: 4.0000 mg | Freq: Four times a day (QID) | INTRAMUSCULAR | Status: DC | PRN

## 2024-08-16 MED ORDER — SODIUM CHLORIDE 0.9 % IV SOLN
1.0000 g | INTRAVENOUS | Status: DC
  Administered 2024-08-17 – 2024-08-18 (×2): 1 g via INTRAVENOUS
  Filled 2024-08-16 (×2): qty 10

## 2024-08-16 MED ORDER — ROPINIROLE HCL 0.25 MG PO TABS
0.2500 mg | ORAL_TABLET | Freq: Two times a day (BID) | ORAL | Status: DC
  Administered 2024-08-16 – 2024-08-18 (×4): 0.25 mg via ORAL
  Filled 2024-08-16 (×4): qty 1

## 2024-08-16 MED ORDER — FUROSEMIDE 40 MG PO TABS
40.0000 mg | ORAL_TABLET | Freq: Every day | ORAL | Status: DC
  Administered 2024-08-16 – 2024-08-17 (×2): 40 mg via ORAL
  Filled 2024-08-16 (×2): qty 1

## 2024-08-16 MED ORDER — SIMVASTATIN 20 MG PO TABS
40.0000 mg | ORAL_TABLET | Freq: Every day | ORAL | Status: DC
  Administered 2024-08-16 – 2024-08-17 (×2): 40 mg via ORAL
  Filled 2024-08-16 (×2): qty 2

## 2024-08-16 MED ORDER — OXYCODONE HCL 5 MG PO TABS
5.0000 mg | ORAL_TABLET | ORAL | Status: DC | PRN
  Administered 2024-08-17 – 2024-08-18 (×2): 5 mg via ORAL
  Filled 2024-08-16 (×3): qty 1

## 2024-08-16 MED ORDER — GABAPENTIN 400 MG PO CAPS
800.0000 mg | ORAL_CAPSULE | Freq: Three times a day (TID) | ORAL | Status: DC
  Administered 2024-08-16 – 2024-08-18 (×4): 800 mg via ORAL
  Filled 2024-08-16 (×4): qty 2

## 2024-08-16 MED ORDER — POTASSIUM CHLORIDE 10 MEQ/100ML IV SOLN
10.0000 meq | INTRAVENOUS | Status: AC
  Administered 2024-08-16 (×2): 10 meq via INTRAVENOUS
  Filled 2024-08-16 (×2): qty 100

## 2024-08-16 MED ORDER — MORPHINE SULFATE (PF) 2 MG/ML IV SOLN
2.0000 mg | Freq: Once | INTRAVENOUS | Status: AC
  Administered 2024-08-16: 2 mg via INTRAVENOUS
  Filled 2024-08-16: qty 1

## 2024-08-16 MED ORDER — APIXABAN 5 MG PO TABS
5.0000 mg | ORAL_TABLET | Freq: Two times a day (BID) | ORAL | Status: DC
  Administered 2024-08-16 – 2024-08-18 (×4): 5 mg via ORAL
  Filled 2024-08-16 (×4): qty 1

## 2024-08-16 MED ORDER — POTASSIUM CHLORIDE 20 MEQ PO PACK
40.0000 meq | PACK | Freq: Once | ORAL | Status: AC
  Administered 2024-08-16: 40 meq via ORAL
  Filled 2024-08-16: qty 2

## 2024-08-16 NOTE — ED Provider Notes (Addendum)
 [place] EMERGENCY DEPARTMENT AT [facility] Provider Note   CSN: [id number] Arrival date & time: 08/16/24  9041     Patient presents with: Shortness of Breath   [name] is a 83 y.o. male.   Pt with hx chf, c/o sob in past 1-2 days with some chest tightness. Pt poor historian - level 5 caveat. Symptoms at rest, denies exertional chest pain or discomfort. Occasional non prod cough, no sore throat. No fever or chills. No new or worsening leg pain or swelling. Indicates compliant w home meds. Also notes generalized weakness.   The history is provided by the patient, medical records and the EMS personnel.  Shortness of Breath Associated symptoms: chest pain   Associated symptoms: no abdominal pain, no diaphoresis, no fever, no headaches, no neck pain, no sore throat and no vomiting        Prior to Admission medications   Medication Sig Start Date End Date Taking? Authorizing Provider  apixaban  (ELIQUIS ) 5 MG TABS tablet Take 5 mg by mouth 2 (two) times daily.    [provider]  aspirin  EC 81 MG tablet Take 81 mg by mouth daily.    [provider]  buPROPion  (WELLBUTRIN ) 100 MG tablet Take 100 mg by mouth 2 (two) times daily.    [provider]  cyclobenzaprine  (FLEXERIL ) 5 MG tablet Take 1 tablet (5 mg total) by mouth 2 (two) times daily as needed for muscle spasms. 06/18/24   Elnor Jayson LABOR, DO  diltiazem  (CARDIZEM  CD) 120 MG 24 hr capsule Take 1 capsule (120 mg total) by mouth daily. 03/19/24   Fairy Frames, MD  ferrous sulfate  325 (65 FE) MG tablet Take 325 mg by mouth daily with supper. 03/15/20   [provider]  furosemide  (LASIX ) 40 MG tablet Take 1 tablet (40 mg total) by mouth 2 (two) times daily for 5 days. 06/18/24 06/23/24  Elnor Jayson LABOR, DO  gabapentin  (NEURONTIN ) 800 MG tablet Take 800 mg by mouth 3 (three) times daily. 01/02/24   [provider]  lidocaine  (LIDODERM ) 5 % Place 1 patch onto the skin daily as needed.  Remove & Discard patch within 12 hours or as directed by MD 06/18/24   Elnor Jayson LABOR, DO  metoprolol  succinate (TOPROL -XL) 25 MG 24 hr tablet Take 25 mg by mouth daily.    [provider]  oxyCODONE -acetaminophen  (PERCOCET) 10-325 MG tablet Take 1 tablet by mouth every 6 (six) hours as needed for pain.    [provider]  pantoprazole  (PROTONIX ) 40 MG tablet Take 40 mg by mouth 2 (two) times daily.    [provider]  Potassium Chloride  ER 20 MEQ TBCR Take 20 mEq by mouth daily.    [provider]  rOPINIRole  (REQUIP ) 0.25 MG tablet Take 0.25 mg by mouth 2 (two) times daily.  10/09/18   [provider]  simvastatin  (ZOCOR ) 40 MG tablet Take 40 mg by mouth at bedtime.    [provider]  sucralfate  (CARAFATE ) 1 g tablet Take 1 g by mouth 2 (two) times daily.    [provider]  tamsulosin  (FLOMAX ) 0.4 MG CAPS capsule Take 0.4 mg by mouth at bedtime. 09/02/19   [provider]  topiramate  (TOPAMAX ) 25 MG capsule Take 25 mg by mouth 2 (two) times daily.    [provider]    Allergies: Ativan  [lorazepam ], Lyrica [pregabalin], Zanaflex [tizanidine hcl], and Cymbalta [duloxetine hcl]    Review of Systems  Constitutional:  Negative for chills, diaphoresis and fever.  HENT:  Negative for sore throat.   Respiratory:  Positive for shortness of breath.   Cardiovascular:  Positive for chest pain. Negative for palpitations and leg swelling.  Gastrointestinal:  Negative for abdominal pain, diarrhea and vomiting.  Genitourinary:  Negative for dysuria and flank pain.  Musculoskeletal:  Negative for neck pain.  Neurological:  Negative for headaches.    Updated Vital Signs BP 111/70   Pulse 99   Temp 98 F (36.7 C) (Oral)   Resp 14   Ht 1.753 m (5' 9)   Wt 115.7 kg   SpO2 93%   BMI 37.66 kg/m   Physical Exam Vitals and nursing note reviewed.  Constitutional:      Appearance: Normal appearance. He is well-developed.   HENT:     Head: Atraumatic.     Nose: Nose normal.     Mouth/Throat:     Mouth: Mucous membranes are moist.     Pharynx: Oropharynx is clear.  Eyes:     General: No scleral icterus.    Conjunctiva/sclera: Conjunctivae normal.  Neck:     Trachea: No tracheal deviation.  Cardiovascular:     Rate and Rhythm: Normal rate and regular rhythm.     Pulses: Normal pulses.     Heart sounds: Normal heart sounds. No murmur heard.    No friction rub. No gallop.  Pulmonary:     Effort: Pulmonary effort is normal. No accessory muscle usage or respiratory distress.     Breath sounds: Normal breath sounds.  Abdominal:     General: Bowel sounds are normal. There is no distension.     Palpations: Abdomen is soft.     Tenderness: There is no abdominal tenderness. There is no guarding.  Genitourinary:    Comments: No cva tenderness. Musculoskeletal:        General: No swelling or tenderness.     Cervical back: Normal range of motion and neck supple. No rigidity.     Comments: CTLS spine, non tender, aligned, no step off. No back sts or skin lesions/rash.   Skin:    General: Skin is warm and dry.     Findings: No rash.  Neurological:     Mental Status: He is alert.     Comments: Alert, speech clear. Motor/sens grossly intact bil.   Psychiatric:        Mood and Affect: Mood normal.     (all labs ordered are listed, but only abnormal results are displayed) Results for orders placed or performed during the hospital encounter of 08/16/24  CBC   Collection Time: 08/16/24 10:48 AM  Result Value Ref Range   WBC 11.4 (H) 4.0 - 10.5 K/uL   RBC 4.52 4.22 - 5.81 MIL/uL   Hemoglobin 13.4 13.0 - 17.0 g/dL   HCT 58.9 60.9 - 47.9 %   MCV 90.7 80.0 - 100.0 fL   MCH 29.6 26.0 - 34.0 pg   MCHC 32.7 30.0 - 36.0 g/dL   RDW 83.0 (H) 88.4 - 84.4 %   Platelets 245 150 - 400 K/uL   nRBC 0.0 0.0 - 0.2 %  Comprehensive metabolic panel with GFR   Collection Time: 08/16/24 10:48 AM  Result Value Ref Range    Sodium 135 135 - 145 mmol/L   Potassium 2.7 (LL) 3.5 - 5.1 mmol/L   Chloride 87 (L) 98 - 111 mmol/L   CO2 33 (H) 22 - 32 mmol/L   Glucose, Bld 120 (H)  70 - 99 mg/dL   BUN 26 (H) 8 - 23 mg/dL   Creatinine, Ser 8.86 0.61 - 1.24 mg/dL   Calcium  9.6 8.9 - 10.3 mg/dL   Total Protein 8.6 (H) 6.5 - 8.1 g/dL   Albumin 3.6 3.5 - 5.0 g/dL   AST 24 15 - 41 U/L   ALT 18 0 - 44 U/L   Alkaline Phosphatase 100 38 - 126 U/L   Total Bilirubin 1.0 0.0 - 1.2 mg/dL   GFR, Estimated >39 >39 mL/min   Anion gap 15 5 - 15  Brain natriuretic peptide   Collection Time: 08/16/24 10:48 AM  Result Value Ref Range   B Natriuretic Peptide 346.2 (H) 0.0 - 100.0 pg/mL  Magnesium    Collection Time: 08/16/24 10:48 AM  Result Value Ref Range   Magnesium  2.1 1.7 - 2.4 mg/dL  Troponin I (High Sensitivity)   Collection Time: 08/16/24 10:48 AM  Result Value Ref Range   Troponin I (High Sensitivity) 10 <18 ng/L  Resp panel by RT-PCR (RSV, Flu A&B, Covid) Anterior Nasal Swab   Collection Time: 08/16/24 10:49 AM   Specimen: Anterior Nasal Swab  Result Value Ref Range   SARS Coronavirus 2 by RT PCR NEGATIVE NEGATIVE   Influenza A by PCR NEGATIVE NEGATIVE   Influenza B by PCR NEGATIVE NEGATIVE   Resp Syncytial Virus by PCR NEGATIVE NEGATIVE  Urinalysis, Routine w reflex microscopic -Urine, Clean Catch   Collection Time: 08/16/24  2:18 PM  Result Value Ref Range   Color, Urine YELLOW YELLOW   APPearance CLOUDY (A) CLEAR   Specific Gravity, Urine 1.011 1.005 - 1.030   pH 7.0 5.0 - 8.0   Glucose, UA NEGATIVE NEGATIVE mg/dL   Hgb urine dipstick SMALL (A) NEGATIVE   Bilirubin Urine NEGATIVE NEGATIVE   Ketones, ur NEGATIVE NEGATIVE mg/dL   Protein, ur 30 (A) NEGATIVE mg/dL   Nitrite POSITIVE (A) NEGATIVE   Leukocytes,Ua LARGE (A) NEGATIVE   RBC / HPF 0-5 0 - 5 RBC/hpf   WBC, UA >50 0 - 5 WBC/hpf   Bacteria, UA RARE (A) NONE SEEN   Squamous Epithelial / HPF 0-5 0 - 5 /HPF   Mucus PRESENT       EKG: EKG  Interpretation Date/Time:  Sunday August 16 2024 10:11:20 EDT Ventricular Rate:  106 PR Interval:    QRS Duration:  96 QT Interval:  360 QTC Calculation: 478 R Axis:   97  Text Interpretation: Atrial fibrillation Non-specific ST-t changes No significant change since last tracing Confirmed by Bernard Drivers ([id number]) on 08/16/2024 11:18:15 AM  Radiology: [facility] Chest Port 1 View Result Date: 08/16/2024 CLINICAL DATA:  83 year old male with shortness of breath, orthopnea. EXAM: PORTABLE CHEST 1 VIEW COMPARISON:  Portable chest 06/25/2024 and earlier. FINDINGS: Portable AP semi upright view at 1040 hours. Chronic anterior and posterior spinal fusion hardware, lower thoracic posterior spinal fusion hardware, TAVR redemonstrated. Stable low lung volumes. Stable cardiac size and mediastinal contours. Allowing for portable technique the lungs are clear. No pneumothorax or pleural effusion. Chronic left lower rib fractures. Stable visualized osseous structures. Negative visible bowel gas. IMPRESSION: No acute cardiopulmonary abnormality. Electronically Signed   By: VEAR Hurst M.D.   On: 08/16/2024 10:53     Procedures   Medications Ordered in the ED  potassium chloride  10 mEq in 100 mL IVPB (10 mEq Intravenous New Bag/Given 08/16/24 1422)  cefTRIAXone  (ROCEPHIN ) 2 g in sodium chloride  0.9 % 100 mL IVPB (has no administration in time  range)  potassium chloride  (KLOR-CON ) packet 40 mEq (40 mEq Oral Given 08/16/24 1228)  alum & mag hydroxide-simeth (MAALOX/MYLANTA) 200-200-20 MG/5ML suspension 30 mL (30 mLs Oral Given 08/16/24 1323)  famotidine  (PEPCID ) tablet 20 mg (20 mg Oral Given 08/16/24 1323)  acetaminophen  (TYLENOL ) tablet 1,000 mg (1,000 mg Oral Given 08/16/24 1323)                                    Medical Decision Making Problems Addressed: Acute UTI: acute illness or injury with systemic symptoms that poses a threat to life or bodily functions Generalized weakness: acute illness or injury with  systemic symptoms that poses a threat to life or bodily functions Hypokalemia: acute illness or injury with systemic symptoms that poses a threat to life or bodily functions Precordial chest pain: acute illness or injury with systemic symptoms that poses a threat to life or bodily functions Shortness of breath: acute illness or injury with systemic symptoms that poses a threat to life or bodily functions  Amount and/or Complexity of Data Reviewed Independent Historian: EMS    Details: hx External Data Reviewed: notes. Labs: ordered. Decision-making details documented in ED Course. Radiology: ordered and independent interpretation performed. Decision-making details documented in ED Course. ECG/medicine tests: ordered and independent interpretation performed. Decision-making details documented in ED Course. Discussion of management or test interpretation with external provider(s): medicine  Risk OTC drugs. Prescription drug management. Decision regarding hospitalization.   Iv ns. Continuous pulse ox and cardiac monitoring. Labs ordered/sent. Imaging ordered.   Differential diagnosis includes chf, acs, pna, etc. Dispo decision including potential need for admission considered - will get labs and imaging and reassess.   Reviewed nursing notes and prior charts for additional history. External reports reviewed. Additional history from: EMS.   Cardiac monitor: sinus rhythm, rate 98.  Labs reviewed/interpreted by me - wbc 11. Hgb 13. Chem w v low k, kcl iv and po. Mg normal. Ua w many wbcs - cx sent. Rocephin  iv.   Xrays reviewed/interpreted by me - no pna.   Given weakness, cp, hypokalemia, uti,  - will admit to medicine.   Recheck pt, breathing comfortably. No cp.   CRITICAL CARE RE: acute chest pain/sob, severe hypokalemia w parenteral replacement therapy, acute uti Performed by: Promise Bushong E Mardi Cannady Total critical care time: 45 minutes Critical care time was exclusive of separately  billable procedures and treating other patients. Critical care was necessary to treat or prevent imminent or life-threatening deterioration. Critical care was time spent personally by me on the following activities: development of treatment plan with patient and/or surrogate as well as nursing, discussions with consultants, evaluation of patient's response to treatment, examination of patient, obtaining history from patient or surrogate, ordering and performing treatments and interventions, ordering and review of laboratory studies, ordering and review of radiographic studies, pulse oximetry and re-evaluation of patient's condition.       Final diagnoses:  Precordial chest pain  Shortness of breath  Generalized weakness  Hypokalemia  Acute UTI    ED Discharge Orders     None          Bernard Drivers, MD 08/16/24 1533    Bernard Drivers, MD 08/16/24 [phone]

## 2024-08-16 NOTE — ED Triage Notes (Signed)
 Pt. Arrived via [facility] from home. States that pt. Has a Hx of CHF and typically sits up when he sleeps, but last night he laid flat and now experiencing SOB. Pt. Also c/o some back pain due to laying on top of glasses last night.[facility] states that VSS and that his O2 would dip into lower 90s; so they put him on 1L of O2.

## 2024-08-16 NOTE — H&P (Addendum)
 History and Physical    [name] FMW:[id number] DOB: [birth year] DOA: 08/16/2024  I have briefly reviewed the patient's prior medical records in [facility]  PCP: Brett Darice LITTIE, MD  Patient coming from: home  Chief Complaint: chest pain  HPI: [name] is a 83 y.o. male with medical history significant of hypertension, hyperlipidemia, prior CVA, permanent A-fib on chronic anticoagulation, nonobstructive CAD comes into the hospital with complaints of chest pain and generalized weakness.  Patient is a very poor historian, quite upset when I entered the room that he had asked for pain medications 4 hours ago and he did not receive anything.  He does tell me he is having chest pain, but denies any shortness of breath, no abdominal pain, no nausea or vomiting.  Most answers he gives by yelling.  Of note, RN said that his pain location has changed few times while in the ED, and his chest, abdomen, back  ED Course: In the emergency room he is afebrile, heart rate around 100, and normotensive.  He is satting well on room air.  Blood work reveals potassium of 2.7, BNP slightly up at 346.2, and a urinalysis with pyuria and foul-smelling.  His high-sensitivity troponin is negative x 1.  Due to his ongoing weakness, chest discomfort, UTI we are asked to admit  Review of Systems: All systems reviewed, and apart from HPI, all negative  Past Medical History:  Diagnosis Date   Cancer [facility])    Carpal tunnel syndrome    Cervical spondylosis with myelopathy    Chronic pain syndrome    Depression    Difficult intubation    needs smaller tube; awake oral fiberoptic scope 8.0 ETT 03/30/08   Dysphagia, pharyngeal phase    Hemorrhage of gastrointestinal tract, unspecified    History of cardiac cath    a. 03/2018 Cath: LAD and LCX with mild luminal irregularities.  No significant disease.   Iron deficiency anemia, unspecified    Lumbosacral spondylosis without myelopathy    Muscle weakness  (generalized)    Other and unspecified disc disorder of cervical region    Other and unspecified hyperlipidemia    Patent foramen ovale    a. 05/2018 Echo: post-op TAVR--> + bublble study w/ L->R shunt.   Permanent atrial fibrillation (HCC)    a.  Diagnosed in the spring 2019.  Had rapid atrial fibrillation following TAVR and has been rate controlled with beta-blocker.  CHA2DS2VASc equals 7.  Supposed to be on Eliquis .   Primary localized osteoarthrosis, lower leg    Primary localized osteoarthrosis, shoulder region    Reflux esophagitis    Severe aortic stenosis    a. mild-mod AS by 01/2016 TTE, restricted AV opening with mod AR 01/2016 TEE; b. 10/2017 Echo: EF 60-65%, sev Ca2+ AoV, mean grad ; c. 05/2018 s/p TAVR ([facility]); d. 06/2018 Echo: EF 55-60%, well-seated prosth AoV, peak velocity 156cm/s. AoV gradient .   Spinal stenosis, unspecified region other than cervical    Stroke (HCC)    01/2016   Syncope and collapse    Unspecified arthropathy, lower leg    Unspecified constipation    Unspecified essential hypertension    Unspecified glaucoma(365.9)     Past Surgical History:  Procedure Laterality Date   BILATERAL CATARACT SURGERY  2009   DR GROAT    BIOPSY  10/14/2018   Procedure: BIOPSY;  Surgeon: Brett Lesta FALCON, MD;  Location: [facility] ENDOSCOPY;  Service: Endoscopy;;   CARDIAC VALVE REPLACEMENT  05/2018  CERVIACAL SPINE (4X)     DR MARK ROY    COLONOSCOPY  2007   DR HENSEL    COLONOSCOPY WITH PROPOFOL  N/A 11/20/2017   Procedure: COLONOSCOPY WITH PROPOFOL ;  Surgeon: Dianna Specking, MD;  Location: [facility] ENDOSCOPY;  Service: Endoscopy;  Laterality: N/A;   ESOPHAGOGASTRODUODENOSCOPY (EGD) WITH PROPOFOL  N/A 10/14/2018   Procedure: ESOPHAGOGASTRODUODENOSCOPY (EGD) WITH PROPOFOL ;  Surgeon: Brett Lesta FALCON, MD;  Location: [facility] ENDOSCOPY;  Service: Endoscopy;  Laterality: N/A;   JOINT REPLACEMENT     both knees   LEFT KNEE REPLACEMENT     DR ALUSIO   LEFT TRANSVERSE CARPAL LIGAMENT   01/06/2008   ROTATOR CUFF LEFT SHOULDER  2001   DR MURPHY    SHOULDER OPEN ROTATOR CUFF REPAIR  2006   DR [facility]   SPINE SURGERY     neck fusion     reports that he quit smoking about 38 years ago. His smoking use included cigarettes. He quit smokeless tobacco use about 39 years ago. He reports that he does not drink alcohol and does not use drugs.  Allergies  Allergen Reactions   Ativan  [Lorazepam ] Other (See Comments)    Delirium   Lyrica [Pregabalin] Swelling and Other (See Comments)    Triggered PTSD, Nightmares, Aggression   Zanaflex [Tizanidine Hcl] Swelling and Anxiety   Cymbalta [Duloxetine Hcl] Other (See Comments)    Increased depression, Irritability     Family History  Problem Relation Age of Onset   Cancer Mother 53       cervical cancer   Cancer Father 8       lung  cancer    Prior to Admission medications   Medication Sig Start Date End Date Taking? Authorizing Provider  apixaban  (ELIQUIS ) 5 MG TABS tablet Take 5 mg by mouth 2 (two) times daily.    [provider]  aspirin  EC 81 MG tablet Take 81 mg by mouth daily.    [provider]  buPROPion  (WELLBUTRIN ) 100 MG tablet Take 100 mg by mouth 2 (two) times daily.    [provider]  cyclobenzaprine  (FLEXERIL ) 5 MG tablet Take 1 tablet (5 mg total) by mouth 2 (two) times daily as needed for muscle spasms. 06/18/24   Elnor Jayson LABOR, DO  diltiazem  (CARDIZEM  CD) 120 MG 24 hr capsule Take 1 capsule (120 mg total) by mouth daily. 03/19/24   Fairy Frames, MD  ferrous sulfate  325 (65 FE) MG tablet Take 325 mg by mouth daily with supper. 03/15/20   [provider]  furosemide  (LASIX ) 40 MG tablet Take 1 tablet (40 mg total) by mouth 2 (two) times daily for 5 days. 06/18/24 06/23/24  Elnor Jayson A, DO  gabapentin  (NEURONTIN ) 800 MG tablet Take 800 mg by mouth 3 (three) times daily. 01/02/24   [provider]  lidocaine  (LIDODERM ) 5 % Place 1 patch onto the skin daily as needed.  Remove & Discard patch within 12 hours or as directed by MD 06/18/24   Elnor Jayson A, DO  metoprolol  succinate (TOPROL -XL) 25 MG 24 hr tablet Take 25 mg by mouth daily.    [provider]  oxyCODONE -acetaminophen  (PERCOCET) 10-325 MG tablet Take 1 tablet by mouth every 6 (six) hours as needed for pain.    [provider]  pantoprazole  (PROTONIX ) 40 MG tablet Take 40 mg by mouth 2 (two) times daily.    [provider]  Potassium Chloride  ER 20 MEQ TBCR Take 20 mEq by mouth daily.  [provider]  rOPINIRole  (REQUIP ) 0.25 MG tablet Take 0.25 mg by mouth 2 (two) times daily.  10/09/18   [provider]  simvastatin  (ZOCOR ) 40 MG tablet Take 40 mg by mouth at bedtime.    [provider]  sucralfate  (CARAFATE ) 1 g tablet Take 1 g by mouth 2 (two) times daily.    [provider]  tamsulosin  (FLOMAX ) 0.4 MG CAPS capsule Take 0.4 mg by mouth at bedtime. 09/02/19   [provider]  topiramate  (TOPAMAX ) 25 MG capsule Take 25 mg by mouth 2 (two) times daily.    [provider]   Physical Exam: Vitals:   08/16/24 1200 08/16/24 1215 08/16/24 1430 08/16/24 1510  BP: 130/77 121/60 111/70   Pulse: 100 (!) 108 99   Resp: 17 15 14    Temp:    98 F (36.7 C)  TempSrc:    Oral  SpO2: 94% 98% 93%   Weight:      Height:       Constitutional: Upset, appears uncomfortable Eyes: Anicteric ENMT: Mucous membranes are moist.  Neck: normal, supple Respiratory: clear to auscultation bilaterally, no wheezing, no crackles.  Cardiovascular: Irregularly irregular, trace edema Abdomen: no tenderness, no masses palpated. Bowel sounds positive.  Musculoskeletal: no clubbing / cyanosis. Normal muscle tone.  Skin: no rashes, lesions, ulcers. No induration.  Some excoriations bilateral lower extremities Neurologic: No focal deficits  Labs on Admission: I have personally reviewed following labs and imaging studies  CBC: Recent Labs  Lab  08/16/24 1048  WBC 11.4*  HGB 13.4  HCT 41.0  MCV 90.7  PLT 245   Basic Metabolic Panel: Recent Labs  Lab 08/16/24 1048  NA 135  K 2.7*  CL 87*  CO2 33*  GLUCOSE 120*  BUN 26*  CREATININE 1.13  CALCIUM  9.6  MG 2.1   Liver Function Tests: Recent Labs  Lab 08/16/24 1048  AST 24  ALT 18  ALKPHOS 100  BILITOT 1.0  PROT 8.6*  ALBUMIN 3.6   Coagulation Profile: No results for input(s): INR, PROTIME in the last 168 hours. BNP (last 3 results) Recent Labs    05/26/24 1648 06/18/24 1720  PROBNP 3,172.0* 2,730.0*   CBG: No results for input(s): GLUCAP in the last 168 hours. Thyroid  Function Tests: No results for input(s): TSH, T4TOTAL, FREET4, T3FREE, THYROIDAB in the last 72 hours. Urine analysis:    Component Value Date/Time   COLORURINE YELLOW 08/16/2024 1418   APPEARANCEUR CLOUDY (A) 08/16/2024 1418   LABSPEC 1.011 08/16/2024 1418   PHURINE 7.0 08/16/2024 1418   GLUCOSEU NEGATIVE 08/16/2024 1418   HGBUR SMALL (A) 08/16/2024 1418   HGBUR negative 04/08/2007 1327   BILIRUBINUR NEGATIVE 08/16/2024 1418   KETONESUR NEGATIVE 08/16/2024 1418   PROTEINUR 30 (A) 08/16/2024 1418   UROBILINOGEN 0.2 04/24/2010 1617   NITRITE POSITIVE (A) 08/16/2024 1418   LEUKOCYTESUR LARGE (A) 08/16/2024 1418     Radiological Exams on Admission: DG Chest Port 1 View Result Date: 08/16/2024 CLINICAL DATA:  83 year old male with shortness of breath, orthopnea. EXAM: PORTABLE CHEST 1 VIEW COMPARISON:  Portable chest 06/25/2024 and earlier. FINDINGS: Portable AP semi upright view at 1040 hours. Chronic anterior and posterior spinal fusion hardware, lower thoracic posterior spinal fusion hardware, TAVR redemonstrated. Stable low lung volumes. Stable cardiac size and mediastinal contours. Allowing for portable technique the lungs are clear. No pneumothorax or pleural effusion. Chronic left lower rib fractures. Stable visualized osseous structures. Negative visible bowel  gas. IMPRESSION: No acute  cardiopulmonary abnormality. Electronically Signed   By: VEAR Hurst M.D.   On: 08/16/2024 10:53    EKG: Independently reviewed.  Sinus rhythm  Assessment/Plan Principal problem Chest pain-initial troponin is negative.  EKG nonischemic.  He is on anticoagulation already so do not suspect PE.  He has a history of chronic pain.  Med rec here was not done yet - His pain is varying per bedside RN over the last few hours, sometimes in the back, legs, abdomen, chest.  Low suspicion for ACS, a second troponin is pending  Active problems UTI-patient came in with generalized weakness, found to have pyuria.  He is also slightly confused on my evaluation.  Continue ceftriaxone  while awaiting urine cultures  Permanent A-fib-he is in A-fib here, resume home medications once med rec is done, continue home Eliquis  as well  Hyperlipidemia-he is on a statin  Hypokalemia-potassium quite low at 2.7, replete aggressively, already received 40 mEq in the ER, will repeat dose.  He is on furosemide  at home  Chronic diastolic CHF-chest x-ray clear, he does not look particularly fluid overloaded.  Continue home Lasix   Essential hypertension-continue home medications  BPH-continue tamsulosin   Chronic pain-continue home medications, based on PDMP review he was taking 150 oxycodone /acetaminophen  10/325/month, however in July the refill seems to have dropped to 90/month and buprenorphine  2 mg was added as 3 times daily.  Will continue buprenorphine  while here   DVT prophylaxis: on eliquis   Code Status: Full code (presumed based on prior admission, he is slightly confused).  I was unable to reach his family over the phone Family Communication: no family at bedside  Bed Type: telemetry  Consults called: none  Obs/Inp: Obs  Nilda Fendt, MD, PhD [facility]  Contact via [email or web address]  08/16/2024, 4:03 PM

## 2024-08-17 DIAGNOSIS — I110 Hypertensive heart disease with heart failure: Secondary | ICD-10-CM | POA: Diagnosis not present

## 2024-08-17 DIAGNOSIS — Z87891 Personal history of nicotine dependence: Secondary | ICD-10-CM | POA: Diagnosis not present

## 2024-08-17 DIAGNOSIS — I5032 Chronic diastolic (congestive) heart failure: Secondary | ICD-10-CM | POA: Diagnosis not present

## 2024-08-17 DIAGNOSIS — R531 Weakness: Secondary | ICD-10-CM | POA: Diagnosis not present

## 2024-08-17 DIAGNOSIS — Z1152 Encounter for screening for COVID-19: Secondary | ICD-10-CM | POA: Diagnosis not present

## 2024-08-17 LAB — CBC
HCT: 38.3 % — ABNORMAL LOW (ref 39.0–52.0)
Hemoglobin: 12.2 g/dL — ABNORMAL LOW (ref 13.0–17.0)
MCH: 29.3 pg (ref 26.0–34.0)
MCHC: 31.9 g/dL (ref 30.0–36.0)
MCV: 91.8 fL (ref 80.0–100.0)
Platelets: 217 K/uL (ref 150–400)
RBC: 4.17 MIL/uL — ABNORMAL LOW (ref 4.22–5.81)
RDW: 16.8 % — ABNORMAL HIGH (ref 11.5–15.5)
WBC: 10.2 K/uL (ref 4.0–10.5)
nRBC: 0 % (ref 0.0–0.2)

## 2024-08-17 LAB — COMPREHENSIVE METABOLIC PANEL WITH GFR
ALT: 16 U/L (ref 0–44)
AST: 19 U/L (ref 15–41)
Albumin: 2.9 g/dL — ABNORMAL LOW (ref 3.5–5.0)
Alkaline Phosphatase: 82 U/L (ref 38–126)
Anion gap: 13 (ref 5–15)
BUN: 23 mg/dL (ref 8–23)
CO2: 33 mmol/L — ABNORMAL HIGH (ref 22–32)
Calcium: 9.1 mg/dL (ref 8.9–10.3)
Chloride: 93 mmol/L — ABNORMAL LOW (ref 98–111)
Creatinine, Ser: 1.04 mg/dL (ref 0.61–1.24)
GFR, Estimated: >60 mL/min (ref >60)
Glucose, Bld: 102 mg/dL — ABNORMAL HIGH (ref 70–99)
Potassium: 2.9 mmol/L — ABNORMAL LOW (ref 3.5–5.1)
Sodium: 139 mmol/L (ref 135–145)
Total Bilirubin: 0.8 mg/dL (ref 0.0–1.2)
Total Protein: 7.4 g/dL (ref 6.5–8.1)

## 2024-08-17 MED ORDER — POTASSIUM CHLORIDE 10 MEQ/100ML IV SOLN
10.0000 meq | INTRAVENOUS | Status: AC
  Administered 2024-08-17 (×2): 10 meq via INTRAVENOUS
  Filled 2024-08-17 (×2): qty 100

## 2024-08-17 MED ORDER — POTASSIUM CHLORIDE CRYS ER 20 MEQ PO TBCR
40.0000 meq | EXTENDED_RELEASE_TABLET | ORAL | Status: AC
  Administered 2024-08-17 (×2): 40 meq via ORAL
  Filled 2024-08-17 (×2): qty 2

## 2024-08-17 MED ORDER — MORPHINE SULFATE (PF) 2 MG/ML IV SOLN
2.0000 mg | INTRAVENOUS | Status: DC | PRN
  Administered 2024-08-17: 2 mg via INTRAVENOUS
  Filled 2024-08-17: qty 1

## 2024-08-17 MED ORDER — HYDRALAZINE HCL 20 MG/ML IJ SOLN: 10.0000 mg | Freq: Once | INTRAMUSCULAR | Status: DC

## 2024-08-17 NOTE — Care Management Obs Status (Signed)
 MEDICARE OBSERVATION STATUS NOTIFICATION   Patient Details  Name: [name] MRN: [id number] Date of Birth: July 30, 1941   Medicare Observation Status Notification Given:  Yes    Vonzell Arrie Sharps 08/17/2024, 9:15 AM

## 2024-08-17 NOTE — Plan of Care (Signed)
  Problem: Education: Goal: Knowledge of General Education information will improve Description: Including pain rating scale, medication(s)/side effects and non-pharmacologic comfort measures Outcome: Progressing   Problem: Clinical Measurements: Goal: Cardiovascular complication will be avoided Outcome: Progressing   Problem: Pain Managment: Goal: General experience of comfort will improve and/or be controlled Outcome: Progressing

## 2024-08-17 NOTE — Progress Notes (Signed)
 PROGRESS NOTE  [name] FMW:[id number] DOB: 1941-03-14 DOA: 08/16/2024 PCP: Burney Darice LITTIE, MD   LOS: 0 days   Brief Narrative / Interim history: 83 y.o. male with medical history significant of chronic pain, hypertension, hyperlipidemia, prior CVA, permanent A-fib on chronic anticoagulation, nonobstructive CAD comes into the hospital with complaints of chest pain and generalized weakness.   Subjective / 24h Interval events: He is feeling much better this morning, tells me his pain is much better controlled.  Assesement and Plan: Principal problem Chest pain -patient presented to the hospital with chest pain, however it seems like this is related to his chronic pain since on multiple requirements pain migrating to the back, legs, abdomen. - He has a history of chronic pain, home regimen was resumed and he is feeling much better this morning - He ruled for ACS with troponin negative x 2, nonischemic EKG   Active problems UTI-patient came in with generalized weakness, found to have pyuria.  He was started on ceftriaxone , continue while monitoring cultures.  Confusion resolved  Generalized weakness-still complains of significant weakness today.  May be due to hypokalemia and a UTI.  PT evaluation pending  Permanent A-fib-he is in A-fib here, continue home metoprolol , I am not sure whether he is taking diltiazem .  Since his blood pressures are soft, keep on metoprolol  alone   Hyperlipidemia-he is on a statin   Hypokalemia-potassium remains low this morning at 2.9, continue to replete aggressively   Chronic diastolic CHF-chest x-ray clear, he does not look particularly fluid overloaded.  Did not home Lasix , able to lay flat this morning   Essential hypertension-continue home medications with metoprolol  alone, blood pressure stable/soft   BPH-continue tamsulosin    Chronic pain-continue home medications, he is on oxycodone /acetaminophen  10/325, along with buprenorphine  which was  just started a month ago.  Scheduled Meds:  apixaban   5 mg Oral BID   aspirin  EC  81 mg Oral Daily   buprenorphine   2 mg Sublingual Q8H   buPROPion   100 mg Oral BID   ferrous sulfate   325 mg Oral Q supper   furosemide   40 mg Oral Daily   gabapentin   800 mg Oral TID   metoprolol  succinate  25 mg Oral Daily   rOPINIRole   0.25 mg Oral BID   simvastatin   40 mg Oral QHS   tamsulosin   0.4 mg Oral QHS   topiramate   25 mg Oral BID   Continuous Infusions:  cefTRIAXone  (ROCEPHIN )  IV 1 g (08/17/24 0811)   PRN Meds:.acetaminophen  **OR** acetaminophen , ondansetron  **OR** ondansetron  (ZOFRAN ) IV, oxyCODONE   Current Outpatient Medications  Medication Instructions   apixaban  (ELIQUIS ) 5 mg, Oral, 2 times daily   aspirin  EC 81 mg, Oral, Daily   buprenorphine  (SUBUTEX ) 2 mg, 4 times daily PRN   buPROPion  (WELLBUTRIN ) 100 mg, Oral, 2 times daily   diltiazem  (CARDIZEM  CD) 120 mg, Oral, Daily   ferrous sulfate  325 mg, Oral, Daily with supper   furosemide  (LASIX ) 40 mg, Oral, 2 times daily   gabapentin  (NEURONTIN ) 800 mg, Oral, 3 times daily   lidocaine  (LIDODERM ) 5 % 1 patch, Transdermal, Daily PRN, Remove & Discard patch within 12 hours or as directed by MD   metoprolol  succinate (TOPROL -XL) 25 mg, Oral, Daily   pantoprazole  (PROTONIX ) 40 mg, Oral, 2 times daily   Potassium Chloride  ER 20 MEQ TBCR 20 mEq, Oral, Daily   rOPINIRole  (REQUIP ) 0.25 mg, Oral, 2 times daily   silver sulfADIAZINE (SILVADENE) 1 % cream 1 Application, 2  times daily   simvastatin  (ZOCOR ) 40 mg, Oral, Daily at bedtime   sucralfate  (CARAFATE ) 1 g, Oral, 2 times daily   tamsulosin  (FLOMAX ) 0.4 mg, Oral, Daily at bedtime   topiramate  (TOPAMAX ) 25 mg, Oral, 2 times daily    Diet Orders (From admission, onward)     Start     Ordered   08/16/24 1804  Diet regular Room service appropriate? Yes; Fluid consistency: Thin  Diet effective now       Question Answer Comment  Room service appropriate? Yes   Fluid consistency:  Thin      08/16/24 1803            DVT prophylaxis:  apixaban  (ELIQUIS ) tablet 5 mg   Lab Results  Component Value Date   PLT 217 08/17/2024      Code Status: Full Code  Family Communication: No family at bedside  Status is: Observation The patient will require care spanning > 2 midnights and should be moved to inpatient because: Persistent weakness, ongoing hypokalemia   Level of care: Telemetry Cardiac  Consultants:  none  Objective: Vitals:   08/16/24 2113 08/16/24 2340 08/17/24 0440 08/17/24 0755  BP: 130/61 108/66 98/62 (!) 109/91  Pulse: 100 87 84 93  Resp:  17 19   Temp:  98.2 F (36.8 C) 97.8 F (36.6 C)   TempSrc:  Oral Oral   SpO2:  93% 90%   Weight:   83.3 kg   Height:        Intake/Output Summary (Last 24 hours) at 08/17/2024 0933 Last data filed at 08/17/2024 0831 Gross per 24 hour  Intake 360 ml  Output 300 ml  Net 60 ml   Wt Readings from Last 3 Encounters:  08/17/24 83.3 kg  05/26/24 121.6 kg  05/05/24 105.7 kg    Examination:  Constitutional: NAD Eyes: no scleral icterus ENMT: Mucous membranes are moist.  Neck: normal, supple Respiratory: clear to auscultation bilaterally, no wheezing, no crackles.  Cardiovascular: Regular rate and rhythm, no murmurs / rubs / gallops. No LE edema.  Abdomen: non distended, no tenderness. Bowel sounds positive.  Musculoskeletal: no clubbing / cyanosis.    Data Reviewed: I have independently reviewed following labs and imaging studies   CBC Recent Labs  Lab 08/16/24 1048 08/17/24 0320  WBC 11.4* 10.2  HGB 13.4 12.2*  HCT 41.0 38.3*  PLT 245 217  MCV 90.7 91.8  MCH 29.6 29.3  MCHC 32.7 31.9  RDW 16.9* 16.8*    Recent Labs  Lab 08/16/24 1048 08/17/24 0320  NA 135 139  K 2.7* 2.9*  CL 87* 93*  CO2 33* 33*  GLUCOSE 120* 102*  BUN 26* 23  CREATININE 1.13 1.04  CALCIUM  9.6 9.1  AST 24 19  ALT 18 16  ALKPHOS 100 82  BILITOT 1.0 0.8  ALBUMIN 3.6 2.9*  MG 2.1  --   BNP 346.2*   --     ------------------------------------------------------------------------------------------------------------------ No results for input(s): CHOL, HDL, LDLCALC, TRIG, CHOLHDL, LDLDIRECT in the last 72 hours.  Lab Results  Component Value Date   HGBA1C 5.3 01/30/2024   ------------------------------------------------------------------------------------------------------------------ No results for input(s): TSH, T4TOTAL, T3FREE, THYROIDAB in the last 72 hours.  Invalid input(s): FREET3  Cardiac Enzymes No results for input(s): CKMB, TROPONINI, MYOGLOBIN in the last 168 hours.  Invalid input(s): CK ------------------------------------------------------------------------------------------------------------------    Component Value Date/Time   BNP 346.2 (H) 08/16/2024 1048    CBG: No results for input(s): GLUCAP in the last 168 hours.  Recent Results (from the past 240 hours)  Resp panel by RT-PCR (RSV, Flu A&B, Covid) Anterior Nasal Swab     Status: None   Collection Time: 08/16/24 10:49 AM   Specimen: Anterior Nasal Swab  Result Value Ref Range Status   SARS Coronavirus 2 by RT PCR NEGATIVE NEGATIVE Final   Influenza A by PCR NEGATIVE NEGATIVE Final   Influenza B by PCR NEGATIVE NEGATIVE Final    Comment: (NOTE) The Xpert Xpress SARS-CoV-2/FLU/RSV plus assay is intended as an aid in the diagnosis of influenza from Nasopharyngeal swab specimens and should not be used as a sole basis for treatment. Nasal washings and aspirates are unacceptable for Xpert Xpress SARS-CoV-2/FLU/RSV testing.  Fact Sheet for Patients: BloggerCourse.com  Fact Sheet for Healthcare Providers: SeriousBroker.it  This test is not yet approved or cleared by the [place]  FDA and has been authorized for detection and/or diagnosis of SARS-CoV-2 by FDA under an Emergency Use Authorization (EUA). This EUA will  remain in effect (meaning this test can be used) for the duration of the COVID-19 declaration under Section 564(b)(1) of the Act, 21 U.S.C. section 360bbb-3(b)(1), unless the authorization is terminated or revoked.     Resp Syncytial Virus by PCR NEGATIVE NEGATIVE Final    Comment: (NOTE) Fact Sheet for Patients: BloggerCourse.com  Fact Sheet for Healthcare Providers: SeriousBroker.it  This test is not yet approved or cleared by the [place]  FDA and has been authorized for detection and/or diagnosis of SARS-CoV-2 by FDA under an Emergency Use Authorization (EUA). This EUA will remain in effect (meaning this test can be used) for the duration of the COVID-19 declaration under Section 564(b)(1) of the Act, 21 U.S.C. section 360bbb-3(b)(1), unless the authorization is terminated or revoked.  Performed at [facility], [address], [place], [place] [address]      Radiology Studies: DG Chest [place] 1 View Result Date: 08/16/2024 CLINICAL DATA:  83 year old male with shortness of breath, orthopnea. EXAM: PORTABLE CHEST 1 VIEW COMPARISON:  Portable chest 06/25/2024 and earlier. FINDINGS: Portable AP semi upright view at 1040 hours. Chronic anterior and posterior spinal fusion hardware, lower thoracic posterior spinal fusion hardware, TAVR redemonstrated. Stable low lung volumes. Stable cardiac size and mediastinal contours. Allowing for portable technique the lungs are clear. No pneumothorax or pleural effusion. Chronic left lower rib fractures. Stable visualized osseous structures. Negative visible bowel gas. IMPRESSION: No acute cardiopulmonary abnormality. Electronically Signed   By: VEAR Hurst M.D.   On: 08/16/2024 10:53     Nilda Fendt, MD, PhD [facility]  Between 7 am - 7 pm I am available, please contact me via Amion (for emergencies) or Securechat (non urgent messages)  Between 7 pm - 7 am I am not available,  please contact night coverage MD/APP via Amion

## 2024-08-17 NOTE — Evaluation (Signed)
 Occupational Therapy Evaluation Patient Details Name: [name]: February 01, 1941 Today's Date: 08/17/2024   History of Present Illness   83 y.o. male adm 08/16/24 with chest pain, weakness, UTI. PMH: HTN, CAD, HFpEF, AS s/p TAVR,  A-fib on Eliquis , HLD, bil TKA, CVA, chronic back pain/history of multiple back surgeries, depression     Clinical Impressions PTA, pt lived with sister and reports he was independent and driving. Upon OT entry to room, pt's RN and NT attempting to redirect him from L arm pain to get into the bed for an EKG ordered by MD. Upon offering help, RN accepting. Attempting to redirect pt from pain for self-initiated transfer to bed for EKG and pt using profanity throughout with max difficulty being redirected. With tactile cueing/light guidance pt able to STS and transfer to chair min A. Of note, K running through IV in L arm and MD believes this may be source of pain. Eval limited, but despite this, pt mobilized well. Suspect pt will be able to dc home with HHOT.      If plan is discharge home, recommend the following:   A little help with walking and/or transfers;A little help with bathing/dressing/bathroom;Assist for transportation;Help with stairs or ramp for entrance     Functional Status Assessment   Patient has had a recent decline in their functional status and demonstrates the ability to make significant improvements in function in a reasonable and predictable amount of time.     Equipment Recommendations   BSC/3in1     Recommendations for Other Services         Precautions/Restrictions   Precautions Precautions: Fall Recall of Precautions/Restrictions: Impaired     Mobility Bed Mobility Overal bed mobility: Needs Assistance Bed Mobility: Sit to Supine       Sit to supine: Mod assist   General bed mobility comments: for LEs into bed    Transfers Overall transfer level: Needs assistance   Transfers: Sit to/from  Stand, Bed to chair/wheelchair/BSC Sit to Stand: Min assist     Step pivot transfers: Min assist     General transfer comment: for rise      Balance Overall balance assessment: Needs assistance Sitting-balance support: No upper extremity supported, Feet supported Sitting balance-Leahy Scale: Fair     Standing balance support: Bilateral upper extremity supported, Reliant on assistive device for balance, During functional activity Standing balance-Leahy Scale: Poor                             ADL either performed or assessed with clinical judgement   ADL Overall ADL's : Needs assistance/impaired Eating/Feeding: Set up;Sitting   Grooming: Set up;Sitting   Upper Body Bathing: Set up;Sitting   Lower Body Bathing: Minimal assistance;Sit to/from stand   Upper Body Dressing : Set up;Sitting   Lower Body Dressing: Minimal assistance;Sit to/from stand   Toilet Transfer: Minimal assistance;Stand-pivot                   Vision   Additional Comments: not assessed; pt pre-occupied by pain     Perception         Praxis         Pertinent Vitals/Pain Pain Assessment Pain Assessment: Faces Faces Pain Scale: Hurts little more Pain Location: L arm Pain Descriptors / Indicators: Aching, Constant Pain Intervention(s): Limited activity within patient's tolerance, Monitored during session     Extremity/Trunk Assessment Upper Extremity Assessment Upper Extremity  Assessment: Generalized weakness   Lower Extremity Assessment Lower Extremity Assessment: Defer to PT evaluation   Cervical / Trunk Assessment Cervical / Trunk Assessment: Kyphotic   Communication Communication Communication: No apparent difficulties   Cognition Arousal: Alert Behavior During Therapy: WFL for tasks assessed/performed Cognition: No family/caregiver present to determine baseline             OT - Cognition Comments: pt with L arm pain and max difficulty being redirected;  using profanity with nursing staff on OT arrival and departure                 Following commands: Impaired Following commands impaired: Follows one step commands inconsistently     Cueing  General Comments   Cueing Techniques: Verbal cues;Gestural cues  RN and NT attempting to redirect pt from pain to get him in bed for EKG   Exercises     Shoulder Instructions      Home Living Family/patient expects to be discharged to:: Private residence Living Arrangements: Other relatives (sister) Available Help at Discharge: Family;Available 24 hours/day Type of Home: House Home Access: Level entry     Home Layout: One level     Bathroom Shower/Tub: Producer, television/film/video: Handicapped height     Home Equipment: Wheelchair - Forensic psychologist (2 wheels);Cane - single point;Rollator (4 wheels)          Prior Functioning/Environment Prior Level of Function : Independent/Modified Independent             Mobility Comments: reports ambulating with cane ADLs Comments: reports driving and independence    OT Problem List: Decreased strength;Decreased activity tolerance;Impaired balance (sitting and/or standing);Decreased safety awareness;Decreased knowledge of use of DME or AE;Pain;Decreased cognition   OT Treatment/Interventions: Self-care/ADL training;Therapeutic exercise;DME and/or AE instruction;Therapeutic activities;Patient/family education;Balance training      OT Goals(Current goals can be found in the care plan section)   Acute Rehab OT Goals Patient Stated Goal: decrease pain OT Goal Formulation: With patient Time For Goal Achievement: 08/31/24 Potential to Achieve Goals: Good   OT Frequency:  Min 2X/week    Co-evaluation              AM-PAC OT 6 Clicks Daily Activity     Outcome Measure Help from another person eating meals?: None Help from another person taking care of personal grooming?: A Little Help from another person  toileting, which includes using toliet, bedpan, or urinal?: A Little Help from another person bathing (including washing, rinsing, drying)?: A Little Help from another person to put on and taking off regular upper body clothing?: A Little Help from another person to put on and taking off regular lower body clothing?: A Little 6 Click Score: 19   End of Session Equipment Utilized During Treatment: Gait belt Nurse Communication: Mobility status  Activity Tolerance: Patient tolerated treatment well;Patient limited by pain;Treatment limited secondary to agitation Patient left: in bed;with call bell/phone within reach;with nursing/sitter in room  OT Visit Diagnosis: Unsteadiness on feet (R26.81);Muscle weakness (generalized) (M62.81);Other symptoms and signs involving cognitive function;Pain                Time: 1145-1156 OT Time Calculation (min): 11 min Charges:  OT General Charges $OT Visit: 1 Visit OT Evaluation $OT Eval Moderate Complexity: 1 Mod  Elma JONETTA Lebron FREDERICK, OTR/L [facility] Acute Rehabilitation Office: [phone]   Elma JONETTA Lebron 08/17/2024, 12:43 PM

## 2024-08-17 NOTE — Evaluation (Signed)
 Physical Therapy Evaluation Patient Details Name: [name] MRN: [id number] DOB: [birth year] Today's Date: 08/17/2024  History of Present Illness  83 y.o. male adm 08/16/24 with chest pain, weakness, UTI. PMH: HTN, CAD, HFpEF, AS s/p TAVR,  A-fib on Eliquis , HLD, bil TKA, CVA, chronic back pain/history of multiple back surgeries, depression  Clinical Impression  Pt reports living with sister but they stay out of each other's rooms so she would not be able to assist with bed level transfers. Pt reports intense mid thoracic pain with bed mobility limiting function and that he is normally independent, driving his mustang and picking up women. Pt complaining about hospital and staff throughout ambulation with resistance to cueing for mobility, reassured pt of care, then thanking nursing staff end of session. Pt with decreased strength, transfers, gait and function who will benefit from acute therapy to maximize mobility and independence. HHPT recommended.       If plan is discharge home, recommend the following: A lot of help with walking and/or transfers;A little help with bathing/dressing/bathroom;Assistance with cooking/housework   Can travel by private vehicle        Equipment Recommendations None recommended by PT  Recommendations for Other Services  OT consult    Functional Status Assessment Patient has had a recent decline in their functional status and/or demonstrates limited ability to make significant improvements in function in a reasonable and predictable amount of time     Precautions / Restrictions Precautions Precautions: Fall Recall of Precautions/Restrictions: Impaired      Mobility  Bed Mobility Overal bed mobility: Needs Assistance Bed Mobility: Rolling, Sidelying to Sit Rolling: Mod assist Sidelying to sit: Mod assist       General bed mobility comments: pt attempted pivoting toward EOB with rail and HOB elevated, unable. Mod assist to roll with pad and  elevate trunk to sitting with pt cursing despite assist    Transfers Overall transfer level: Needs assistance   Transfers: Sit to/from Stand Sit to Stand: Min assist           General transfer comment: min assist to rise from EOb with cues for hand placement, CGA to lower to recliner    Ambulation/Gait Ambulation/Gait assistance: Contact guard assist Gait Distance (Feet): 200 Feet Assistive device: Rolling walker (2 wheels) Gait Pattern/deviations: Step-through pattern, Decreased stride length, Trunk flexed   Gait velocity interpretation: <1.8 ft/sec, indicate of risk for recurrent falls   General Gait Details: cues for proximity to RW with trunk flexed and pt tolerating hall ambulation  Stairs            Wheelchair Mobility     Tilt Bed    Modified Rankin (Stroke Patients Only)       Balance Overall balance assessment: Needs assistance Sitting-balance support: No upper extremity supported, Feet supported Sitting balance-Leahy Scale: Fair     Standing balance support: Bilateral upper extremity supported, Reliant on assistive device for balance, During functional activity Standing balance-Leahy Scale: Poor Standing balance comment: reliant on RW in standing                             Pertinent Vitals/Pain Pain Assessment Pain Assessment: 0-10 Pain Score: 6  Pain Location: mid thoracic spine Pain Descriptors / Indicators: Aching, Constant Pain Intervention(s): Limited activity within patient's tolerance, Monitored during session, Repositioned    Home Living Family/patient expects to be discharged to:: Private residence Living Arrangements: Other relatives Available Help at Discharge:  Family;Available 24 hours/day Type of Home: House Home Access: Level entry       Home Layout: One level Home Equipment: Wheelchair - Forensic psychologist (2 wheels);Cane - single point;Rollator (4 wheels)      Prior Function Prior Level of Function :  Independent/Modified Independent             Mobility Comments: reports ambulating with cane ADLs Comments: reports driving and independence     Extremity/Trunk Assessment   Upper Extremity Assessment Upper Extremity Assessment: Generalized weakness    Lower Extremity Assessment Lower Extremity Assessment: Generalized weakness    Cervical / Trunk Assessment Cervical / Trunk Assessment: Kyphotic  Communication   Communication Communication: No apparent difficulties    Cognition Arousal: Alert Behavior During Therapy: WFL for tasks assessed/performed   PT - Cognitive impairments: Safety/Judgement, Problem solving                         Following commands: Impaired Following commands impaired: Follows one step commands inconsistently     Cueing Cueing Techniques: Verbal cues, Gestural cues     General Comments      Exercises     Assessment/Plan    PT Assessment Patient needs continued PT services  PT Problem List Decreased strength;Decreased activity tolerance;Decreased balance;Decreased mobility;Decreased safety awareness;Pain       PT Treatment Interventions DME instruction;Gait training;Functional mobility training;Therapeutic activities;Therapeutic exercise;Patient/family education    PT Goals (Current goals can be found in the Care Plan section)  Acute Rehab PT Goals Patient Stated Goal: return home and pick up girls in my mustang PT Goal Formulation: With patient Time For Goal Achievement: 08/31/24 Potential to Achieve Goals: Fair    Frequency Min 2X/week     Co-evaluation               AM-PAC PT 6 Clicks Mobility  Outcome Measure Help needed turning from your back to your side while in a flat bed without using bedrails?: A Lot Help needed moving from lying on your back to sitting on the side of a flat bed without using bedrails?: A Lot Help needed moving to and from a bed to a chair (including a wheelchair)?: A  Little Help needed standing up from a chair using your arms (e.g., wheelchair or bedside chair)?: A Little Help needed to walk in hospital room?: A Little Help needed climbing 3-5 steps with a railing? : A Lot 6 Click Score: 15    End of Session Equipment Utilized During Treatment: Gait belt Activity Tolerance: Patient tolerated treatment well Patient left: in chair;with call bell/phone within reach;with chair alarm set Nurse Communication: Mobility status PT Visit Diagnosis: Other abnormalities of gait and mobility (R26.89);Difficulty in walking, not elsewhere classified (R26.2);Muscle weakness (generalized) (M62.81)    Time: 9067-8998 PT Time Calculation (min) (ACUTE ONLY): 29 min   Charges:   PT Evaluation $PT Eval Moderate Complexity: 1 Mod PT Treatments $Therapeutic Activity: 8-22 mins PT General Charges $$ ACUTE PT VISIT: 1 Visit         Lenoard SQUIBB, PT Acute Rehabilitation Services Office: [phone]   Lenoard NOVAK Fortune Brannigan 08/17/2024, 11:46 AM

## 2024-08-18 ENCOUNTER — Other Ambulatory Visit (HOSPITAL_COMMUNITY): Payer: Self-pay

## 2024-08-18 DIAGNOSIS — R531 Weakness: Secondary | ICD-10-CM | POA: Diagnosis not present

## 2024-08-18 LAB — COMPREHENSIVE METABOLIC PANEL WITH GFR
ALT: 17 U/L (ref 0–44)
AST: 25 U/L (ref 15–41)
Albumin: 2.6 g/dL — ABNORMAL LOW (ref 3.5–5.0)
Alkaline Phosphatase: 74 U/L (ref 38–126)
Anion gap: 9 (ref 5–15)
BUN: 30 mg/dL — ABNORMAL HIGH (ref 8–23)
CO2: 34 mmol/L — ABNORMAL HIGH (ref 22–32)
Calcium: 9 mg/dL (ref 8.9–10.3)
Chloride: 93 mmol/L — ABNORMAL LOW (ref 98–111)
Creatinine, Ser: 1.11 mg/dL (ref 0.61–1.24)
GFR, Estimated: >60 mL/min (ref >60)
Glucose, Bld: 97 mg/dL (ref 70–99)
Potassium: 3.7 mmol/L (ref 3.5–5.1)
Sodium: 136 mmol/L (ref 135–145)
Total Bilirubin: 0.6 mg/dL (ref 0.0–1.2)
Total Protein: 6.9 g/dL (ref 6.5–8.1)

## 2024-08-18 LAB — CBC
HCT: 36.9 % — ABNORMAL LOW (ref 39.0–52.0)
Hemoglobin: 11.7 g/dL — ABNORMAL LOW (ref 13.0–17.0)
MCH: 29 pg (ref 26.0–34.0)
MCHC: 31.7 g/dL (ref 30.0–36.0)
MCV: 91.6 fL (ref 80.0–100.0)
Platelets: 238 K/uL (ref 150–400)
RBC: 4.03 MIL/uL — ABNORMAL LOW (ref 4.22–5.81)
RDW: 16.5 % — ABNORMAL HIGH (ref 11.5–15.5)
WBC: 10.2 K/uL (ref 4.0–10.5)
nRBC: 0 % (ref 0.0–0.2)

## 2024-08-18 LAB — URINE CULTURE: Culture: >=100000 — AB

## 2024-08-18 LAB — MAGNESIUM: Magnesium: 2.2 mg/dL (ref 1.7–2.4)

## 2024-08-18 MED ORDER — BUPROPION HCL 100 MG PO TABS
100.0000 mg | ORAL_TABLET | Freq: Two times a day (BID) | ORAL | 0 refills | Status: AC
  Filled 2024-08-18: qty 90, 45d supply, fill #0

## 2024-08-18 MED ORDER — OXYCODONE HCL 5 MG PO TABS: 5.0000 mg | ORAL_TABLET | Freq: Once | ORAL | Status: DC

## 2024-08-18 MED ORDER — CEFADROXIL 500 MG PO CAPS
500.0000 mg | ORAL_CAPSULE | Freq: Two times a day (BID) | ORAL | 0 refills | Status: DC
  Filled 2024-08-18: qty 8, 4d supply, fill #0

## 2024-08-18 MED ORDER — GABAPENTIN 400 MG PO CAPS
800.0000 mg | ORAL_CAPSULE | Freq: Three times a day (TID) | ORAL | 0 refills | Status: DC
  Filled 2024-08-18: qty 180, 30d supply, fill #0

## 2024-08-18 MED ORDER — POTASSIUM CHLORIDE CRYS ER 20 MEQ PO TBCR
20.0000 meq | EXTENDED_RELEASE_TABLET | Freq: Every day | ORAL | 0 refills | Status: AC
  Filled 2024-08-18: qty 90, 90d supply, fill #0

## 2024-08-18 MED ORDER — SIMVASTATIN 40 MG PO TABS
40.0000 mg | ORAL_TABLET | Freq: Every day | ORAL | 0 refills | Status: AC
  Filled 2024-08-18: qty 90, 90d supply, fill #0

## 2024-08-18 MED ORDER — METOPROLOL SUCCINATE ER 25 MG PO TB24
25.0000 mg | ORAL_TABLET | Freq: Every day | ORAL | 0 refills | Status: DC
  Filled 2024-08-18: qty 90, 90d supply, fill #0

## 2024-08-18 MED ORDER — POTASSIUM CHLORIDE CRYS ER 20 MEQ PO TBCR
30.0000 meq | EXTENDED_RELEASE_TABLET | Freq: Once | ORAL | Status: AC
  Administered 2024-08-18: 30 meq via ORAL
  Filled 2024-08-18: qty 1

## 2024-08-18 MED ORDER — ASPIRIN EC 81 MG PO TBEC
81.0000 mg | DELAYED_RELEASE_TABLET | Freq: Every day | ORAL | 0 refills | Status: DC
  Filled 2024-08-18: qty 90, 90d supply, fill #0

## 2024-08-18 NOTE — Discharge Summary (Signed)
 Physician Discharge Summary  [name] FMW:[id number] DOB: 08-Jul-1941 DOA: 08/16/2024  PCP: Burney Darice LITTIE, MD  Admit date: 08/16/2024 Discharge date: 08/18/2024  Admitted From: home Disposition:  home  Recommendations for Outpatient Follow-up:  Follow up with PCP in 1-2 weeks Please obtain BMP/CBC in one week  Home Health: PT Equipment/Devices: none  Discharge Condition: stable CODE STATUS: Full code Diet Orders (From admission, onward)     Start     Ordered   08/16/24 1804  Diet regular Room service appropriate? Yes; Fluid consistency: Thin  Diet effective now       Question Answer Comment  Room service appropriate? Yes   Fluid consistency: Thin      08/16/24 1803            Brief Narrative / Interim history: 83 y.o. male with medical history significant of chronic pain, hypertension, hyperlipidemia, prior CVA, permanent A-fib on chronic anticoagulation, nonobstructive CAD comes into the hospital with complaints of chest pain and generalized weakness.   Hospital Course / Discharge diagnoses: Principal problem Chest pain -patient presented to the hospital with chest pain, however it seems like this is related to his chronic pain since asking at various times, pain migrating to the back, legs, abdomen. He has a history of chronic pain, home regimen was resumed and he is feeling much better this morning. He ruled for ACS with troponin negative x 2, nonischemic EKG   Active problems UTI-patient came in with generalized weakness, found to have pyuria.  He was started on ceftriaxone , continue while monitoring cultures.  Mild confusion on admission now resolved, his urine cultures speciated E. coli which is resistant to ampicillin , Bactrim and amp sulbactam, otherwise sensitive.  He will be transition to cefadroxil  and will finish course as an outpatient  Generalized weakness-improved with UTI treatment, worked with physical therapy and home health was  recommended. Permanent A-fib-he is in A-fib here, continue home metoprolol , I am not sure whether he is taking diltiazem  and patient cannot tell me.  Apparently there is an empty bottle of diltiazem  in his house, and it is old.  Since his blood pressures are soft and rate is controlled, keep on metoprolol  alone.  Hyperlipidemia-he is on a statin Hypokalemia-replenished, now normalized, likely medication noncompliance at home with his home potassium supplementation Chronic diastolic CHF-chest x-ray clear, he does not look particularly fluid overloaded.    Essential hypertension-continue home medications with metoprolol  alone, blood pressure stable BPH-continue tamsulosin  Chronic pain-continue home medications, he is on oxycodone /acetaminophen  10/325, along with buprenorphine  which was just started a month ago.  Sepsis ruled out   Discharge Instructions   Allergies as of 08/18/2024       Reactions   Ativan  [lorazepam ] Other (See Comments)   Delirium   Lyrica [pregabalin] Swelling, Other (See Comments)   Triggered PTSD, Nightmares, Aggression   Zanaflex [tizanidine Hcl] Swelling, Anxiety   Cymbalta [duloxetine Hcl] Other (See Comments)   Increased depression, Irritability         Medication List     STOP taking these medications    diltiazem  120 MG 24 hr capsule Commonly known as: CARDIZEM  CD       TAKE these medications    apixaban  5 MG Tabs tablet Commonly known as: ELIQUIS  Take 5 mg by mouth 2 (two) times daily.   aspirin  EC 81 MG tablet Take 1 tablet (81 mg total) by mouth daily.   buprenorphine  2 MG Subl SL tablet Commonly known as: SUBUTEX  Place 2  mg under the tongue 4 (four) times daily as needed (pain).   buPROPion  100 MG tablet Commonly known as: WELLBUTRIN  Take 1 tablet (100 mg total) by mouth 2 (two) times daily.   cefadroxil  500 MG capsule Commonly known as: DURICEF Take 1 capsule (500 mg total) by mouth 2 (two) times daily for 4 days.   ferrous  sulfate 325 (65 FE) MG tablet Take 325 mg by mouth daily with supper.   furosemide  40 MG tablet Commonly known as: LASIX  Take 1 tablet (40 mg total) by mouth 2 (two) times daily for 5 days. What changed: when to take this   gabapentin  800 MG tablet Commonly known as: NEURONTIN  Take 1 tablet (800 mg total) by mouth 3 (three) times daily.   lidocaine  5 % Commonly known as: Lidoderm  Place 1 patch onto the skin daily as needed. Remove & Discard patch within 12 hours or as directed by MD   metoprolol  succinate 25 MG 24 hr tablet Commonly known as: TOPROL -XL Take 1 tablet (25 mg total) by mouth daily.   pantoprazole  40 MG tablet Commonly known as: PROTONIX  Take 40 mg by mouth 2 (two) times daily.   Potassium Chloride  ER 20 MEQ Tbcr Take 1 tablet (20 mEq total) by mouth daily.   rOPINIRole  0.25 MG tablet Commonly known as: REQUIP  Take 0.25 mg by mouth 2 (two) times daily.   silver sulfADIAZINE 1 % cream Commonly known as: SILVADENE Apply 1 Application topically 2 (two) times daily.   simvastatin  40 MG tablet Commonly known as: ZOCOR  Take 1 tablet (40 mg total) by mouth at bedtime.   sucralfate  1 g tablet Commonly known as: CARAFATE  Take 1 g by mouth 2 (two) times daily.   tamsulosin  0.4 MG Caps capsule Commonly known as: FLOMAX  Take 0.4 mg by mouth at bedtime.   topiramate  25 MG capsule Commonly known as: TOPAMAX  Take 25 mg by mouth 2 (two) times daily.        Follow-up Information     [name], MD Follow up.   Specialty: Family Medicine Contact information: [address] [place] [address] [place] [address] [phone]                 Consultations: none  Procedures/Studies:  DG Chest Port 1 View Result Date: 08/16/2024 CLINICAL DATA:  83 year old male with shortness of breath, orthopnea. EXAM: PORTABLE CHEST 1 VIEW COMPARISON:  Portable chest 06/25/2024 and earlier. FINDINGS: Portable AP semi upright view at 1040 hours. Chronic anterior  and posterior spinal fusion hardware, lower thoracic posterior spinal fusion hardware, TAVR redemonstrated. Stable low lung volumes. Stable cardiac size and mediastinal contours. Allowing for portable technique the lungs are clear. No pneumothorax or pleural effusion. Chronic left lower rib fractures. Stable visualized osseous structures. Negative visible bowel gas. IMPRESSION: No acute cardiopulmonary abnormality. Electronically Signed   By: VEAR Hurst M.D.   On: 08/16/2024 10:53     Subjective: - no chest pain, shortness of breath, no abdominal pain, nausea or vomiting.   Discharge Exam: BP 92/76 (BP Location: Right Arm)   Pulse 93   Temp 97.7 F (36.5 C) (Oral)   Resp 17   Ht 5' 9 (1.753 m)   Wt 88.5 kg   SpO2 94%   BMI 28.81 kg/m   General: Pt is alert, awake, not in acute distress Cardiovascular: RRR, S1/S2 +, no rubs, no gallops Respiratory: CTA bilaterally, no wheezing, no rhonchi Abdominal: Soft, NT, ND, bowel sounds + Extremities: no edema, no  cyanosis    The results of significant diagnostics from this hospitalization (including imaging, microbiology, ancillary and laboratory) are listed below for reference.     Microbiology: Recent Results (from the past 240 hours)  Resp panel by RT-PCR (RSV, Flu A&B, Covid) Anterior Nasal Swab     Status: None   Collection Time: 08/16/24 10:49 AM   Specimen: Anterior Nasal Swab  Result Value Ref Range Status   SARS Coronavirus 2 by RT PCR NEGATIVE NEGATIVE Final   Influenza A by PCR NEGATIVE NEGATIVE Final   Influenza B by PCR NEGATIVE NEGATIVE Final    Comment: (NOTE) The Xpert Xpress SARS-CoV-2/FLU/RSV plus assay is intended as an aid in the diagnosis of influenza from Nasopharyngeal swab specimens and should not be used as a sole basis for treatment. Nasal washings and aspirates are unacceptable for Xpert Xpress SARS-CoV-2/FLU/RSV testing.  Fact Sheet for Patients: BloggerCourse.com  Fact Sheet for  Healthcare Providers: SeriousBroker.it  This test is not yet approved or cleared by the [place]  FDA and has been authorized for detection and/or diagnosis of SARS-CoV-2 by FDA under an Emergency Use Authorization (EUA). This EUA will remain in effect (meaning this test can be used) for the duration of the COVID-19 declaration under Section 564(b)(1) of the Act, 21 U.S.C. section 360bbb-3(b)(1), unless the authorization is terminated or revoked.     Resp Syncytial Virus by PCR NEGATIVE NEGATIVE Final    Comment: (NOTE) Fact Sheet for Patients: BloggerCourse.com  Fact Sheet for Healthcare Providers: SeriousBroker.it  This test is not yet approved or cleared by the [place]  FDA and has been authorized for detection and/or diagnosis of SARS-CoV-2 by FDA under an Emergency Use Authorization (EUA). This EUA will remain in effect (meaning this test can be used) for the duration of the COVID-19 declaration under Section 564(b)(1) of the Act, 21 U.S.C. section 360bbb-3(b)(1), unless the authorization is terminated or revoked.  Performed at [facility], [address] [place], [place] [address]   Urine Culture     Status: Abnormal   Collection Time: 08/16/24  3:29 PM   Specimen: Urine, Clean Catch  Result Value Ref Range Status   Specimen Description URINE, CLEAN CATCH  Final   Special Requests   Final    NONE Performed at [facility], [address] [place], [place] [address]    Culture >=100,000 COLONIES/mL ESCHERICHIA COLI (A)  Final   Report Status 08/18/2024 FINAL  Final   Organism ID, Bacteria ESCHERICHIA COLI (A)  Final      Susceptibility   Escherichia coli - MIC*    AMPICILLIN  >=32 RESISTANT Resistant     CEFAZOLIN (URINE) Value in next row Sensitive      4 SENSITIVEThis is a modified FDA-approved test that has been validated and its performance characteristics determined  by the reporting laboratory.  This laboratory is certified under the Clinical Laboratory Improvement Amendments CLIA as qualified to perform high complexity clinical laboratory testing.    CEFEPIME Value in next row Sensitive      4 SENSITIVEThis is a modified FDA-approved test that has been validated and its performance characteristics determined by the reporting laboratory.  This laboratory is certified under the Clinical Laboratory Improvement Amendments CLIA as qualified to perform high complexity clinical laboratory testing.    ERTAPENEM Value in next row Sensitive      4 SENSITIVEThis is a modified FDA-approved test that has been validated and its performance characteristics determined by the reporting laboratory.  This laboratory is certified under the Clinical Laboratory Improvement Amendments CLIA as qualified to perform high complexity clinical laboratory testing.    CEFTRIAXONE  Value in next row Sensitive      4 SENSITIVEThis is a modified FDA-approved test that has been validated and its performance characteristics determined by the reporting laboratory.  This laboratory is certified under the Clinical Laboratory Improvement Amendments CLIA as qualified to perform high complexity clinical laboratory testing.    CIPROFLOXACIN Value in next row Sensitive      4 SENSITIVEThis is a modified FDA-approved test that has been validated and its performance characteristics determined by the reporting laboratory.  This laboratory is certified under the Clinical Laboratory Improvement Amendments CLIA as qualified to perform high complexity clinical laboratory testing.    GENTAMICIN Value in next row Sensitive      4 SENSITIVEThis is a modified FDA-approved test that has been validated and its performance characteristics determined by the reporting laboratory.  This laboratory is certified under the Clinical Laboratory Improvement Amendments CLIA as qualified to perform high complexity clinical laboratory  testing.    NITROFURANTOIN Value in next row Sensitive      4 SENSITIVEThis is a modified FDA-approved test that has been validated and its performance characteristics determined by the reporting laboratory.  This laboratory is certified under the Clinical Laboratory Improvement Amendments CLIA as qualified to perform high complexity clinical laboratory testing.    TRIMETH/SULFA Value in next row Resistant      4 SENSITIVEThis is a modified FDA-approved test that has been validated and its performance characteristics determined by the reporting laboratory.  This laboratory is certified under the Clinical Laboratory Improvement Amendments CLIA as qualified to perform high complexity clinical laboratory testing.    AMPICILLIN /SULBACTAM Value in next row Resistant      4 SENSITIVEThis is a modified FDA-approved test that has been validated and its performance characteristics determined by the reporting laboratory.  This laboratory is certified under the Clinical Laboratory Improvement Amendments CLIA as qualified to perform high complexity clinical laboratory testing.    PIP/TAZO Value in next row Sensitive ug/mL     <=4 SENSITIVEThis is a modified FDA-approved test that has been validated and its performance characteristics determined by the reporting laboratory.  This laboratory is certified under the Clinical Laboratory Improvement Amendments CLIA as qualified to perform high complexity clinical laboratory testing.    MEROPENEM Value in next row Sensitive      <=4 SENSITIVEThis is a modified FDA-approved test that has been validated and its performance characteristics determined by the reporting laboratory.  This laboratory is certified under the Clinical Laboratory Improvement Amendments CLIA as qualified to perform high complexity clinical laboratory testing.    * >=100,000 COLONIES/mL ESCHERICHIA COLI     Labs: Basic Metabolic Panel: Recent Labs  Lab 08/16/24 1048 08/17/24 0320 08/18/24 0330   NA 135 139 136  K 2.7* 2.9* 3.7  CL 87* 93* 93*  CO2 33* 33* 34*  GLUCOSE 120* 102* 97  BUN 26* 23 30*  CREATININE 1.13 1.04 1.11  CALCIUM  9.6 9.1 9.0  MG 2.1  --  2.2   Liver Function Tests: Recent Labs  Lab 08/16/24 1048 08/17/24 0320 08/18/24 0330  AST 24 19 25   ALT 18 16 17   ALKPHOS 100 82 74  BILITOT 1.0 0.8 0.6  PROT 8.6* 7.4 6.9  ALBUMIN 3.6 2.9* 2.6*   CBC: Recent Labs  Lab 08/16/24 1048 08/17/24 0320 08/18/24 0330  WBC 11.4* 10.2 10.2  HGB  13.4 12.2* 11.7*  HCT 41.0 38.3* 36.9*  MCV 90.7 91.8 91.6  PLT 245 217 238   CBG: No results for input(s): GLUCAP in the last 168 hours. Hgb A1c No results for input(s): HGBA1C in the last 72 hours. Lipid Profile No results for input(s): CHOL, HDL, LDLCALC, TRIG, CHOLHDL, LDLDIRECT in the last 72 hours. Thyroid  function studies No results for input(s): TSH, T4TOTAL, T3FREE, THYROIDAB in the last 72 hours.  Invalid input(s): FREET3 Urinalysis    Component Value Date/Time   COLORURINE YELLOW 08/16/2024 1418   APPEARANCEUR CLOUDY (A) 08/16/2024 1418   LABSPEC 1.011 08/16/2024 1418   PHURINE 7.0 08/16/2024 1418   GLUCOSEU NEGATIVE 08/16/2024 1418   HGBUR SMALL (A) 08/16/2024 1418   HGBUR negative 04/08/2007 1327   BILIRUBINUR NEGATIVE 08/16/2024 1418   KETONESUR NEGATIVE 08/16/2024 1418   PROTEINUR 30 (A) 08/16/2024 1418   UROBILINOGEN 0.2 04/24/2010 1617   NITRITE POSITIVE (A) 08/16/2024 1418   LEUKOCYTESUR LARGE (A) 08/16/2024 1418    FURTHER DISCHARGE INSTRUCTIONS:   Get Medicines reviewed and adjusted: Please take all your medications with you for your next visit with your Primary MD   Laboratory/radiological data: Please request your Primary MD to go over all hospital tests and procedure/radiological results at the follow up, please ask your Primary MD to get all Hospital records sent to his/her office.   In some cases, they will be blood work, cultures and biopsy results  pending at the time of your discharge. Please request that your primary care M.D. goes through all the records of your hospital data and follows up on these results.   Also Note the following: If you experience worsening of your admission symptoms, develop shortness of breath, life threatening emergency, suicidal or homicidal thoughts you must seek medical attention immediately by calling 911 or calling your MD immediately  if symptoms less severe.   You must read complete instructions/literature along with all the possible adverse reactions/side effects for all the Medicines you take and that have been prescribed to you. Take any new Medicines after you have completely understood and accpet all the possible adverse reactions/side effects.    Do not drive when taking Pain medications or sleeping medications (Benzodaizepines)   Do not take more than prescribed Pain, Sleep and Anxiety Medications. It is not advisable to combine anxiety,sleep and pain medications without talking with your primary care practitioner   Special Instructions: If you have smoked or chewed Tobacco  in the last 2 yrs please stop smoking, stop any regular Alcohol  and or any Recreational drug use.   Wear Seat belts while driving.   Please note: You were cared for by a hospitalist during your hospital stay. Once you are discharged, your primary care physician will handle any further medical issues. Please note that NO REFILLS for any discharge medications will be authorized once you are discharged, as it is imperative that you return to your primary care physician (or establish a relationship with a primary care physician if you do not have one) for your post hospital discharge needs so that they can reassess your need for medications and monitor your lab values.  Time coordinating discharge: 40 minutes  SIGNED:  Nilda Fendt, MD, PhD 08/18/2024, 9:15 AM

## 2024-08-18 NOTE — TOC Initial Note (Signed)
 Transition of Care [facility]) - Initial/Assessment Note    Patient Details  Name: [name] MRN: [id number] Date of Birth: 02/08/1941  Transition of Care [facility]) CM/SW Contact:    Waddell Barnie Rama, RN Phone Number: 08/18/2024, 10:39 AM  Clinical Narrative:                 From home with sister, has PCP and insurance on file, states has no HH services in place at this time, has cane, walker, rollabout and electric w/chair  at home.  States family member  (sister) will transport them home at [employer] and family is support system, [facility] [address]  Pta self ambulatory.   Patient gives this NCM permission to speak with his sister [name] Conrad.  She states she will transport him home. She states he takes his own bath, she has someone that comes in every 2 weeks to change bed and keep the house clean.  He has a walk in shower. NCM offered choice, he and sister has no preference, NCM made referral to [place]  with [facility].  She is able to take referral.  Soc will begin 24 to 48 hrs post dc.  His sister states he does not need a bsc, his bathroom is close to his bedroom.     Expected Discharge Plan: Home w Home Health Services Barriers to Discharge: No Barriers Identified   Patient Goals and CMS Choice Patient states their goals for this hospitalization and ongoing recovery are:: return home with sister CMS Medicare.gov Compare Post Acute Care list provided to:: Patient Choice offered to / list presented to : Patient      Expected Discharge Plan and Services In-house Referral: NA Discharge Planning Services: CM Consult Post Acute Care Choice: Home Health Living arrangements for the past 2 months: Single Family Home Expected Discharge Date: 08/18/24               DME Arranged: N/A DME Agency: NA       HH Arranged: RN, PT, OT, Nurse's Aide, Social Work [employer] Agency: [facility] Date HH Agency Contacted: 08/18/24 Time HH Agency Contacted:  1037 Representative spoke with at [facility] Agency: [place]  Prior Living Arrangements/Services Living arrangements for the past 2 months: Single Family Home Lives with:: Siblings (sister) Patient language and need for interpreter reviewed:: Yes Do you feel safe going back to the place where you live?: Yes      Need for Family Participation in Patient Care: Yes (Comment) Care giver support system in place?: Yes (comment) Current home services: DME (cane, walker, Rollabout, electric w/chair) Criminal Activity/Legal Involvement Pertinent to Current Situation/Hospitalization: No - Comment as needed  Activities of Daily Living   ADL Screening (condition at time of admission) Is the patient deaf or have difficulty hearing?: No Does the patient have difficulty seeing, even when wearing glasses/contacts?: No Does the patient have difficulty concentrating, remembering, or making decisions?: No  Permission Sought/Granted Permission sought to share information with : Case Manager Permission granted to share information with : Yes, Verbal Permission Granted  Share Information with NAME: [name] Conrad  Permission granted to share info w AGENCY: HH  Permission granted to share info w Relationship: sister  Permission granted to share info w Contact Information: [phone]  Emotional Assessment Appearance:: Appears stated age     Orientation: : Oriented to Self, Oriented to Place, Oriented to  Time, Oriented to Situation Alcohol / Substance Use: Not Applicable Psych Involvement:  No (comment)  Admission diagnosis:  Shortness of breath [R06.02] Hypokalemia [E87.6] Precordial chest pain [R07.2] Acute UTI [N39.0] Generalized weakness [R53.1] Patient Active Problem List   Diagnosis Date Noted   Generalized weakness 08/16/2024   Acute on chronic respiratory failure with hypoxia (HCC) 05/27/2024   Persistent atrial fibrillation (HCC) 05/27/2024   Elevated troponin 05/27/2024   CAD (coronary  artery disease) 05/27/2024   Hyperlipidemia 05/27/2024   BPH (benign prostatic hyperplasia) 05/27/2024   Dizziness 05/26/2024   Aspiration pneumonia (HCC) 03/16/2024   Normocytic anemia 03/16/2024   Acute exacerbation of CHF (congestive heart failure) (HCC) 03/15/2024   Atrial fibrillation with RVR (HCC) 01/30/2024   Rib fracture 11/30/2023   Pneumothorax 11/30/2023   PNA (pneumonia) 10/27/2023   Community acquired pneumonia of left lower lobe of lung 10/26/2023   Hypotension 11/04/2021   Laceration of forehead 11/04/2021   Chronic diastolic CHF (congestive heart failure) (HCC) 11/04/2021   Renal cyst 11/04/2021   Hyperkalemia 11/04/2021   Fall 11/03/2021   Palliative care by specialist    Goals of care, counseling/discussion    UTI (urinary tract infection) 11/10/2019   AF (paroxysmal atrial fibrillation) (HCC) 11/10/2019   Acute blood loss anemia 11/10/2019   Cellulitis 11/10/2019   AKI (acute kidney injury) (HCC) 11/10/2019   History of CVA (cerebrovascular accident) 11/10/2019   Abdominal pain 10/07/2019   Pressure injury of skin 10/07/2019   Atrial fibrillation with rapid ventricular response (HCC) 10/06/2019   Acute encephalopathy    GI bleeding 10/13/2018   Acute metabolic encephalopathy 10/13/2018   Acute renal failure (ARF) (HCC) 10/13/2018   S/P TAVR (transcatheter aortic valve replacement) 09/20/2018   Permanent atrial fibrillation (HCC) 09/20/2018   Acute on chronic diastolic CHF (congestive heart failure) (HCC) 09/18/2018   Heme positive stool 11/20/2017   Gait disorder 03/24/2013   Cervical post-laminectomy syndrome 03/26/2012   Depression 03/26/2012   Degenerative arthritis of shoulder region 03/26/2012   KNEE REPLACEMENT, BILATERAL, HX OF 06/04/2007   CELLULITIS/ABSCESS, ARM 05/05/2007   Anxiety state 04/08/2007   Chronic pain 04/08/2007   OSTEOARTHROSIS, GENERALIZED, MULTIPLE SITES 04/08/2007   SPONDYLOLISTHESIS 04/08/2007   INCONTINENCE, URGE  04/08/2007   HYPERCHOLESTEROLEMIA 03/07/2007   HYPERTENSION, BENIGN 03/07/2007   PCP:  [name], MD Pharmacy:   [facility] [address] [place] - [address] [place] [address] Phone: [phone] Fax: [phone]  [facility] [address] [place] [address] Phone: [phone] Fax: [phone]  [facility] [address] [place] [address] Phone: [phone] Fax: [phone]  [id number] [place], [place] - [address] [facility] [address] [place] [address] Phone: [phone] Fax: [phone]     Social Drivers of Health (SDOH) Social History: SDOH Screenings   Food Insecurity: No Food Insecurity (08/16/2024)  Housing: Low Risk  (08/16/2024)  Transportation Needs: No Transportation Needs (08/16/2024)  Utilities: Not At Risk (08/16/2024)  Social Connections: Unknown (08/16/2024)  Tobacco Use: Medium Risk (08/16/2024)   SDOH Interventions:     Readmission Risk Interventions    03/16/2024   12:59 PM 01/31/2024   11:58 AM 10/27/2023   11:13 AM  Readmission Risk Prevention Plan  Transportation Screening Complete Complete Complete  PCP or Specialist Appt within 5-7 Days Complete Complete Complete  Home Care Screening Complete Complete Complete  Medication Review (RN CM) Complete Complete Complete

## 2024-08-18 NOTE — Progress Notes (Signed)
 DC order noted per MD. DC RN and primary RN at bedside. Per OT note, recommended [facility] DME. Discussion with TOC CM, patient sister denies need for [facility] At the bedside, patient remains agitated d/t complaints of pain to LUE. Complex dynamics present regarding patient home situation given his behavior and sisters request for acute rehab. PT determined patient ambulatory; however, patient need significant cues with transfer and adls. Not a candidate for rehab but would benefit from max benefit with home health. MD notified of recommendation for [facility] RN/PT/OT/SW and Aid for med management, therapy, and home safety eval. Patient agreeable with plan, medicated prior to discharge per patient request. PIV removed. CCMD notified of discharge, Tele returned to charging station. All belongings accounted for. No grey suitcase noted with patient belongings, only clothes in belongings bag. Patient wheeled downstairs for discharge. Sister informed of AVS instructions /TOC meds, and belongings with the patient.

## 2024-08-18 NOTE — TOC Transition Note (Addendum)
 Transition of Care [facility]) - Discharge Note   Patient Details  Name: [name] MRN: [id number] Date of Birth: March 01, 1941  Transition of Care [facility]) CM/SW Contact:  Waddell Barnie Rama, RN Phone Number: 08/18/2024, 10:43 AM   Clinical Narrative:    For dc today, sister will transport him home at dc.  He is set up with [facility] for HHRN, HHPT, HHOT, HHAIDE and CSW.  NCM called April with the [facility] to notify- left message of admission and dc today.   [facility] Dr[facility] Is Dr. Rick, [facility], CSW is Nat Patch [phone] ext [phone].  Final next level of care: Home w Home Health Services Barriers to Discharge: No Barriers Identified   Patient Goals and CMS Choice Patient states their goals for this hospitalization and ongoing recovery are:: return home with sister CMS Medicare.gov Compare Post Acute Care list provided to:: Patient Choice offered to / list presented to : Patient      Discharge Placement                       Discharge Plan and Services Additional resources added to the After Visit Summary for   In-house Referral: NA Discharge Planning Services: CM Consult Post Acute Care Choice: Home Health          DME Arranged: N/A DME Agency: NA       HH Arranged: RN, PT, OT, Nurse's Aide, Social Work [employer] Agency: [facility] Date HH Agency Contacted: 08/18/24 Time HH Agency Contacted: 1037 Representative spoke with at [facility] Agency: [place]  Social Drivers of Health (SDOH) Interventions SDOH Screenings   Food Insecurity: No Food Insecurity (08/16/2024)  Housing: Low Risk  (08/16/2024)  Transportation Needs: No Transportation Needs (08/16/2024)  Utilities: Not At Risk (08/16/2024)  Social Connections: Unknown (08/16/2024)  Tobacco Use: Medium Risk (08/16/2024)     Readmission Risk Interventions    03/16/2024   12:59 PM 01/31/2024   11:58 AM 10/27/2023   11:13 AM  Readmission Risk  Prevention Plan  Transportation Screening Complete Complete Complete  PCP or Specialist Appt within 5-7 Days Complete Complete Complete  Home Care Screening Complete Complete Complete  Medication Review (RN CM) Complete Complete Complete

## 2024-08-18 NOTE — Plan of Care (Signed)
  Problem: Education: Goal: Knowledge of General Education information will improve Description: Including pain rating scale, medication(s)/side effects and non-pharmacologic comfort measures Outcome: Progressing   Problem: Clinical Measurements: Goal: Diagnostic test results will improve Outcome: Progressing   Problem: Clinical Measurements: Goal: Will remain free from infection Outcome: Progressing   Problem: Clinical Measurements: Goal: Cardiovascular complication will be avoided Outcome: Progressing

## 2024-08-18 NOTE — Discharge Instructions (Signed)
 Follow with Brett Darice CROME, MD in 5-7 days  Please get a complete blood count and chemistry panel checked by your Primary MD at your next visit, and again as instructed by your Primary MD. Please get your medications reviewed and adjusted by your Primary MD.  Please request your Primary MD to go over all Hospital Tests and Procedure/Radiological results at the follow up, please get all Hospital records sent to your Prim MD by signing hospital release before you go home.  In some cases, there will be blood work, cultures and biopsy results pending at the time of your discharge. Please request that your primary care M.D. goes through all the records of your hospital data and follows up on these results.  If you had Pneumonia of Lung problems at the Hospital: Please get a 2 view Chest X ray done in 6-8 weeks after hospital discharge or sooner if instructed by your Primary MD.  If you have Congestive Heart Failure: Please call your Cardiologist or Primary MD anytime you have any of the following symptoms:  1) 3 pound weight gain in 24 hours or 5 pounds in 1 week  2) shortness of breath, with or without a dry hacking cough  3) swelling in the hands, feet or stomach  4) if you have to sleep on extra pillows at night in order to breathe  Follow cardiac low salt diet and 1.5 lit/day fluid restriction.  If you have diabetes Accuchecks 4 times/day, Once in AM empty stomach and then before each meal. Log in all results and show them to your primary doctor at your next visit. If any glucose reading is under 80 or above 300 call your primary MD immediately.  If you have Seizure/Convulsions/Epilepsy: Please do not drive, operate heavy machinery, participate in activities at heights or participate in high speed sports until you have seen by Primary MD or a Neurologist and advised to do so again. Per [place]  DMV statutes, patients with seizures are not allowed to drive until they have been  seizure-free for six months.  Use caution when using heavy equipment or power tools. Avoid working on ladders or at heights. Take showers instead of baths. Ensure the water temperature is not too high on the home water heater. Do not go swimming alone. Do not lock yourself in a room alone (i.e. bathroom). When caring for infants or small children, sit down when holding, feeding, or changing them to minimize risk of injury to the child in the event you have a seizure. Maintain good sleep hygiene. Avoid alcohol.   If you had Gastrointestinal Bleeding: Please ask your Primary MD to check a complete blood count within one week of discharge or at your next visit. Your endoscopic/colonoscopic biopsies that are pending at the time of discharge, will also need to followed by your Primary MD.  Get Medicines reviewed and adjusted. Please take all your medications with you for your next visit with your Primary MD  Please request your Primary MD to go over all hospital tests and procedure/radiological results at the follow up, please ask your Primary MD to get all Hospital records sent to his/her office.  If you experience worsening of your admission symptoms, develop shortness of breath, life threatening emergency, suicidal or homicidal thoughts you must seek medical attention immediately by calling 911 or calling your MD immediately  if symptoms less severe.  You must read complete instructions/literature along with all the possible adverse reactions/side effects for all the Medicines you  take and that have been prescribed to you. Take any new Medicines after you have completely understood and accpet all the possible adverse reactions/side effects.   Do not drive or operate heavy machinery when taking Pain medications.   Do not take more than prescribed Pain, Sleep and Anxiety Medications  Special Instructions: If you have smoked or chewed Tobacco  in the last 2 yrs please stop smoking, stop any regular  Alcohol  and or any Recreational drug use.  Wear Seat belts while driving.  Please note You were cared for by a hospitalist during your hospital stay. If you have any questions about your discharge medications or the care you received while you were in the hospital after you are discharged, you can call the unit and asked to speak with the hospitalist on call if the hospitalist that took care of you is not available. Once you are discharged, your primary care physician will handle any further medical issues. Please note that NO REFILLS for any discharge medications will be authorized once you are discharged, as it is imperative that you return to your primary care physician (or establish a relationship with a primary care physician if you do not have one) for your aftercare needs so that they can reassess your need for medications and monitor your lab values.  You can reach the hospitalist office at phone [phone] or fax [phone]   If you do not have a primary care physician, you can call [phone] for a physician referral.  Activity: As tolerated with Full fall precautions use walker/cane & assistance as needed    Diet: heart healthy  Disposition Home

## 2024-08-19 ENCOUNTER — Emergency Department (HOSPITAL_COMMUNITY)

## 2024-08-19 ENCOUNTER — Other Ambulatory Visit: Payer: Self-pay

## 2024-08-19 ENCOUNTER — Inpatient Hospital Stay (HOSPITAL_COMMUNITY)
Admission: EM | Admit: 2024-08-19 | Discharge: 2024-08-24 | DRG: 871 | Disposition: A | Discharge status: Home Health | Attending: Internal Medicine | Admitting: Internal Medicine

## 2024-08-19 ENCOUNTER — Encounter (HOSPITAL_COMMUNITY): Payer: Self-pay

## 2024-08-19 DIAGNOSIS — I5032 Chronic diastolic (congestive) heart failure: Secondary | ICD-10-CM | POA: Diagnosis present

## 2024-08-19 DIAGNOSIS — Z1152 Encounter for screening for COVID-19: Secondary | ICD-10-CM | POA: Diagnosis not present

## 2024-08-19 DIAGNOSIS — I4821 Permanent atrial fibrillation: Secondary | ICD-10-CM | POA: Diagnosis present

## 2024-08-19 DIAGNOSIS — G928 Other toxic encephalopathy: Secondary | ICD-10-CM | POA: Diagnosis present

## 2024-08-19 DIAGNOSIS — Z801 Family history of malignant neoplasm of trachea, bronchus and lung: Secondary | ICD-10-CM

## 2024-08-19 DIAGNOSIS — N390 Urinary tract infection, site not specified: Secondary | ICD-10-CM | POA: Diagnosis present

## 2024-08-19 DIAGNOSIS — M542 Cervicalgia: Secondary | ICD-10-CM | POA: Diagnosis present

## 2024-08-19 DIAGNOSIS — M25531 Pain in right wrist: Secondary | ICD-10-CM | POA: Diagnosis not present

## 2024-08-19 DIAGNOSIS — Z7982 Long term (current) use of aspirin: Secondary | ICD-10-CM

## 2024-08-19 DIAGNOSIS — I2583 Coronary atherosclerosis due to lipid rich plaque: Secondary | ICD-10-CM | POA: Diagnosis not present

## 2024-08-19 DIAGNOSIS — N400 Enlarged prostate without lower urinary tract symptoms: Secondary | ICD-10-CM | POA: Diagnosis present

## 2024-08-19 DIAGNOSIS — I480 Paroxysmal atrial fibrillation: Secondary | ICD-10-CM | POA: Diagnosis not present

## 2024-08-19 DIAGNOSIS — R4182 Altered mental status, unspecified: Principal | ICD-10-CM

## 2024-08-19 DIAGNOSIS — Z1611 Resistance to penicillins: Secondary | ICD-10-CM | POA: Diagnosis present

## 2024-08-19 DIAGNOSIS — Z96652 Presence of left artificial knee joint: Secondary | ICD-10-CM | POA: Diagnosis present

## 2024-08-19 DIAGNOSIS — Z888 Allergy status to other drugs, medicaments and biological substances status: Secondary | ICD-10-CM

## 2024-08-19 DIAGNOSIS — R410 Disorientation, unspecified: Secondary | ICD-10-CM | POA: Diagnosis not present

## 2024-08-19 DIAGNOSIS — G9340 Encephalopathy, unspecified: Secondary | ICD-10-CM | POA: Diagnosis not present

## 2024-08-19 DIAGNOSIS — E875 Hyperkalemia: Secondary | ICD-10-CM | POA: Diagnosis present

## 2024-08-19 DIAGNOSIS — E873 Alkalosis: Secondary | ICD-10-CM | POA: Diagnosis present

## 2024-08-19 DIAGNOSIS — G894 Chronic pain syndrome: Secondary | ICD-10-CM | POA: Diagnosis present

## 2024-08-19 DIAGNOSIS — F259 Schizoaffective disorder, unspecified: Secondary | ICD-10-CM | POA: Diagnosis present

## 2024-08-19 DIAGNOSIS — Z808 Family history of malignant neoplasm of other organs or systems: Secondary | ICD-10-CM

## 2024-08-19 DIAGNOSIS — I2510 Atherosclerotic heart disease of native coronary artery without angina pectoris: Secondary | ICD-10-CM | POA: Diagnosis present

## 2024-08-19 DIAGNOSIS — Z7901 Long term (current) use of anticoagulants: Secondary | ICD-10-CM | POA: Diagnosis not present

## 2024-08-19 DIAGNOSIS — Z952 Presence of prosthetic heart valve: Secondary | ICD-10-CM

## 2024-08-19 DIAGNOSIS — Z79899 Other long term (current) drug therapy: Secondary | ICD-10-CM

## 2024-08-19 DIAGNOSIS — Z87891 Personal history of nicotine dependence: Secondary | ICD-10-CM

## 2024-08-19 DIAGNOSIS — J9621 Acute and chronic respiratory failure with hypoxia: Secondary | ICD-10-CM

## 2024-08-19 DIAGNOSIS — E785 Hyperlipidemia, unspecified: Secondary | ICD-10-CM | POA: Diagnosis present

## 2024-08-19 DIAGNOSIS — R296 Repeated falls: Secondary | ICD-10-CM | POA: Diagnosis present

## 2024-08-19 DIAGNOSIS — I110 Hypertensive heart disease with heart failure: Secondary | ICD-10-CM | POA: Diagnosis present

## 2024-08-19 DIAGNOSIS — R54 Age-related physical debility: Secondary | ICD-10-CM | POA: Diagnosis present

## 2024-08-19 DIAGNOSIS — F32A Depression, unspecified: Secondary | ICD-10-CM | POA: Diagnosis present

## 2024-08-19 DIAGNOSIS — N3000 Acute cystitis without hematuria: Secondary | ICD-10-CM

## 2024-08-19 DIAGNOSIS — A4151 Sepsis due to Escherichia coli [E. coli]: Principal | ICD-10-CM | POA: Diagnosis present

## 2024-08-19 LAB — URINALYSIS, ROUTINE W REFLEX MICROSCOPIC
Bilirubin Urine: NEGATIVE
Glucose, UA: NEGATIVE mg/dL
Ketones, ur: NEGATIVE mg/dL
Nitrite: NEGATIVE
Protein, ur: 30 mg/dL — AB
Specific Gravity, Urine: 1.017 (ref 1.005–1.030)
WBC, UA: >50 WBC/hpf (ref 0–5)
pH: 5 (ref 5.0–8.0)

## 2024-08-19 LAB — I-STAT CHEM 8, ED
BUN: 60 mg/dL — ABNORMAL HIGH (ref 8–23)
Calcium, Ion: 1.07 mmol/L — ABNORMAL LOW (ref 1.15–1.40)
Chloride: 98 mmol/L (ref 98–111)
Creatinine, Ser: 1.2 mg/dL (ref 0.61–1.24)
Glucose, Bld: 104 mg/dL — ABNORMAL HIGH (ref 70–99)
HCT: 41 % (ref 39.0–52.0)
Hemoglobin: 13.9 g/dL (ref 13.0–17.0)
Potassium: 6 mmol/L — ABNORMAL HIGH (ref 3.5–5.1)
Sodium: 133 mmol/L — ABNORMAL LOW (ref 135–145)
TCO2: 31 mmol/L (ref 22–32)

## 2024-08-19 LAB — I-STAT ARTERIAL BLOOD GAS, ED
Acid-Base Excess: 6 mmol/L — ABNORMAL HIGH (ref 0.0–2.0)
Bicarbonate: 31.2 mmol/L — ABNORMAL HIGH (ref 20.0–28.0)
Calcium, Ion: 1.2 mmol/L (ref 1.15–1.40)
HCT: 37 % — ABNORMAL LOW (ref 39.0–52.0)
Hemoglobin: 12.6 g/dL — ABNORMAL LOW (ref 13.0–17.0)
O2 Saturation: 95 %
Patient temperature: 98
Potassium: 4 mmol/L (ref 3.5–5.1)
Sodium: 134 mmol/L — ABNORMAL LOW (ref 135–145)
TCO2: 33 mmol/L — ABNORMAL HIGH (ref 22–32)
pCO2 arterial: 43.7 mmHg (ref 32–48)
pH, Arterial: 7.46 — ABNORMAL HIGH (ref 7.35–7.45)
pO2, Arterial: 69 mmHg — ABNORMAL LOW (ref 83–108)

## 2024-08-19 LAB — CBC WITH DIFFERENTIAL/PLATELET
Abs Immature Granulocytes: 0.05 K/uL (ref 0.00–0.07)
Basophils Absolute: 0 K/uL (ref 0.0–0.1)
Basophils Relative: 0 %
Eosinophils Absolute: 0 K/uL (ref 0.0–0.5)
Eosinophils Relative: 0 %
HCT: 39.2 % (ref 39.0–52.0)
Hemoglobin: 12.5 g/dL — ABNORMAL LOW (ref 13.0–17.0)
Immature Granulocytes: 0 %
Lymphocytes Relative: 7 %
Lymphs Abs: 0.9 K/uL (ref 0.7–4.0)
MCH: 29.1 pg (ref 26.0–34.0)
MCHC: 31.9 g/dL (ref 30.0–36.0)
MCV: 91.4 fL (ref 80.0–100.0)
Monocytes Absolute: 1.6 K/uL — ABNORMAL HIGH (ref 0.1–1.0)
Monocytes Relative: 12 %
Neutro Abs: 10.6 K/uL — ABNORMAL HIGH (ref 1.7–7.7)
Neutrophils Relative %: 81 %
Platelets: 257 K/uL (ref 150–400)
RBC: 4.29 MIL/uL (ref 4.22–5.81)
RDW: 16.6 % — ABNORMAL HIGH (ref 11.5–15.5)
WBC: 13.2 K/uL — ABNORMAL HIGH (ref 4.0–10.5)
nRBC: 0 % (ref 0.0–0.2)

## 2024-08-19 LAB — BASIC METABOLIC PANEL WITH GFR
Anion gap: 12 (ref 5–15)
BUN: 44 mg/dL — ABNORMAL HIGH (ref 8–23)
CO2: 27 mmol/L (ref 22–32)
Calcium: 8.8 mg/dL — ABNORMAL LOW (ref 8.9–10.3)
Chloride: 96 mmol/L — ABNORMAL LOW (ref 98–111)
Creatinine, Ser: 1.08 mg/dL (ref 0.61–1.24)
GFR, Estimated: >60 mL/min (ref >60)
Glucose, Bld: 94 mg/dL (ref 70–99)
Potassium: 3.8 mmol/L (ref 3.5–5.1)
Sodium: 135 mmol/L (ref 135–145)

## 2024-08-19 LAB — I-STAT CG4 LACTIC ACID, ED: Lactic Acid, Venous: 1.2 mmol/L (ref 0.5–1.9)

## 2024-08-19 LAB — TROPONIN I (HIGH SENSITIVITY)
Troponin I (High Sensitivity): 12 ng/L (ref <18)
Troponin I (High Sensitivity): 11 ng/L (ref <18)

## 2024-08-19 LAB — RESP PANEL BY RT-PCR (RSV, FLU A&B, COVID)  RVPGX2
Influenza A by PCR: NEGATIVE
Influenza B by PCR: NEGATIVE
Resp Syncytial Virus by PCR: NEGATIVE
SARS Coronavirus 2 by RT PCR: NEGATIVE

## 2024-08-19 LAB — BRAIN NATRIURETIC PEPTIDE: B Natriuretic Peptide: 197.2 pg/mL — ABNORMAL HIGH (ref 0.0–100.0)

## 2024-08-19 MED ORDER — SODIUM CHLORIDE 0.9 % IV SOLN
1.0000 g | Freq: Every day | INTRAVENOUS | Status: AC
  Administered 2024-08-20 – 2024-08-22 (×3): 1 g via INTRAVENOUS
  Filled 2024-08-19 (×3): qty 10

## 2024-08-19 MED ORDER — SIMVASTATIN 20 MG PO TABS
40.0000 mg | ORAL_TABLET | Freq: Every day | ORAL | Status: DC
  Administered 2024-08-20 – 2024-08-23 (×5): 40 mg via ORAL
  Filled 2024-08-19 (×5): qty 2

## 2024-08-19 MED ORDER — MELATONIN 3 MG PO TABS
3.0000 mg | ORAL_TABLET | Freq: Every evening | ORAL | Status: DC | PRN
  Administered 2024-08-20: 3 mg via ORAL
  Filled 2024-08-19: qty 1

## 2024-08-19 MED ORDER — ACETAMINOPHEN 650 MG RE SUPP
650.0000 mg | Freq: Four times a day (QID) | RECTAL | Status: DC | PRN
Start: 2024-08-19 — End: 2024-08-21

## 2024-08-19 MED ORDER — APIXABAN 5 MG PO TABS
5.0000 mg | ORAL_TABLET | Freq: Two times a day (BID) | ORAL | Status: DC
  Administered 2024-08-19 – 2024-08-24 (×10): 5 mg via ORAL
  Filled 2024-08-19 (×10): qty 1

## 2024-08-19 MED ORDER — TOPIRAMATE 25 MG PO TABS
25.0000 mg | ORAL_TABLET | Freq: Two times a day (BID) | ORAL | Status: DC
  Administered 2024-08-20 – 2024-08-24 (×10): 25 mg via ORAL
  Filled 2024-08-19 (×10): qty 1

## 2024-08-19 MED ORDER — ACETAMINOPHEN 500 MG PO TABS
500.0000 mg | ORAL_TABLET | Freq: Four times a day (QID) | ORAL | Status: DC | PRN
Start: 2024-08-19 — End: 2024-08-21
  Administered 2024-08-20: 500 mg via ORAL
  Filled 2024-08-19: qty 1

## 2024-08-19 MED ORDER — METOPROLOL SUCCINATE ER 25 MG PO TB24
25.0000 mg | ORAL_TABLET | Freq: Every day | ORAL | Status: DC
Start: 2024-08-20 — End: 2024-08-24
  Administered 2024-08-22 – 2024-08-24 (×2): 25 mg via ORAL
  Filled 2024-08-19 (×5): qty 1

## 2024-08-19 MED ORDER — ACETAMINOPHEN 325 MG PO TABS
650.0000 mg | ORAL_TABLET | Freq: Once | ORAL | Status: AC
  Administered 2024-08-19: 650 mg via ORAL
  Filled 2024-08-19: qty 2

## 2024-08-19 MED ORDER — ONDANSETRON HCL 4 MG/2ML IJ SOLN: 4.0000 mg | Freq: Four times a day (QID) | INTRAMUSCULAR | Status: DC | PRN

## 2024-08-19 MED ORDER — SODIUM CHLORIDE 0.9 % IV SOLN: Freq: Once | INTRAVENOUS | Status: AC

## 2024-08-19 MED ORDER — TAMSULOSIN HCL 0.4 MG PO CAPS
0.4000 mg | ORAL_CAPSULE | Freq: Every day | ORAL | Status: DC
  Administered 2024-08-20 – 2024-08-23 (×5): 0.4 mg via ORAL
  Filled 2024-08-19 (×5): qty 1

## 2024-08-19 MED ORDER — BISACODYL 5 MG PO TBEC: 5.0000 mg | DELAYED_RELEASE_TABLET | Freq: Every day | ORAL | Status: DC | PRN

## 2024-08-19 MED ORDER — SODIUM CHLORIDE 0.9 % IV SOLN
1.0000 g | Freq: Once | INTRAVENOUS | Status: AC
  Administered 2024-08-19: 1 g via INTRAVENOUS
  Filled 2024-08-19: qty 10

## 2024-08-19 MED ORDER — ASPIRIN 81 MG PO TBEC
81.0000 mg | DELAYED_RELEASE_TABLET | Freq: Every day | ORAL | Status: DC
  Administered 2024-08-20 – 2024-08-24 (×5): 81 mg via ORAL
  Filled 2024-08-19 (×5): qty 1

## 2024-08-19 MED ORDER — ROPINIROLE HCL 0.25 MG PO TABS
0.2500 mg | ORAL_TABLET | Freq: Two times a day (BID) | ORAL | Status: DC
  Administered 2024-08-20 – 2024-08-24 (×10): 0.25 mg via ORAL
  Filled 2024-08-19 (×10): qty 1

## 2024-08-19 MED ORDER — ONDANSETRON HCL 4 MG PO TABS: 4.0000 mg | ORAL_TABLET | Freq: Four times a day (QID) | ORAL | Status: DC | PRN

## 2024-08-19 NOTE — H&P (Signed)
 History and Physical    Patient: [name] FMW:[id number] DOB: 12-31-1940 DOA: 08/19/2024 DOS: the patient was seen and examined on 08/19/2024 PCP: Burney Darice LITTIE, MD  Patient coming from: Home  Chief Complaint:  Chief Complaint  Patient presents with   Altered Mental Status   HPI: [name] is a 83 y.o. male with medical history significant for schizoaffective disorder, history of TAVR, chronic buprenorphine  use, and Pafib on Eliquis  who was discharged yesterday after being treated for UTI.  All of my history comes from the ED provider as the patient is not able to provide the history and his family is not present.  His sister with whom he lives reports that the patient was talking to himself and laughing when she picked him up from the hospital yesterday.  When they got home he was unable to get out of the car because of weakness.  He continued to be very disoriented and weak so she called EMS this morning.   Review of Systems: unable to review all systems due to the inability of the patient to answer questions. Past Medical History:  Diagnosis Date   Cancer [facility])    Carpal tunnel syndrome    Cervical spondylosis with myelopathy    Chronic pain syndrome    Depression    Difficult intubation    needs smaller tube; awake oral fiberoptic scope 8.0 ETT 03/30/08   Dysphagia, pharyngeal phase    Hemorrhage of gastrointestinal tract, unspecified    History of cardiac cath    a. 03/2018 Cath: LAD and LCX with mild luminal irregularities.  No significant disease.   Iron deficiency anemia, unspecified    Lumbosacral spondylosis without myelopathy    Muscle weakness (generalized)    Other and unspecified disc disorder of cervical region    Other and unspecified hyperlipidemia    Patent foramen ovale    a. 05/2018 Echo: post-op TAVR--> + bublble study w/ L->R shunt.   Permanent atrial fibrillation (HCC)    a.  Diagnosed in the spring 2019.  Had rapid atrial fibrillation following TAVR  and has been rate controlled with beta-blocker.  CHA2DS2VASc equals 7.  Supposed to be on Eliquis .   Primary localized osteoarthrosis, lower leg    Primary localized osteoarthrosis, shoulder region    Reflux esophagitis    Severe aortic stenosis    a. mild-mod AS by 01/2016 TTE, restricted AV opening with mod AR 01/2016 TEE; b. 10/2017 Echo: EF 60-65%, sev Ca2+ AoV, mean grad ; c. 05/2018 s/p TAVR ([facility]); d. 06/2018 Echo: EF 55-60%, well-seated prosth AoV, peak velocity 156cm/s. AoV gradient .   Spinal stenosis, unspecified region other than cervical    Stroke (HCC)    01/2016   Syncope and collapse    Unspecified arthropathy, lower leg    Unspecified constipation    Unspecified essential hypertension    Unspecified glaucoma(365.9)    Past Surgical History:  Procedure Laterality Date   BILATERAL CATARACT SURGERY  2009   DR GROAT    BIOPSY  10/14/2018   Procedure: BIOPSY;  Surgeon: Lennard Lesta FALCON, MD;  Location: [facility] ENDOSCOPY;  Service: Endoscopy;;   CARDIAC VALVE REPLACEMENT  05/2018   CERVIACAL SPINE (4X)     DR MARK ROY    COLONOSCOPY  2007   DR HENSEL    COLONOSCOPY WITH PROPOFOL  N/A 11/20/2017   Procedure: COLONOSCOPY WITH PROPOFOL ;  Surgeon: Dianna Specking, MD;  Location: [facility] ENDOSCOPY;  Service: Endoscopy;  Laterality: N/A;   ESOPHAGOGASTRODUODENOSCOPY (  EGD) WITH PROPOFOL  N/A 10/14/2018   Procedure: ESOPHAGOGASTRODUODENOSCOPY (EGD) WITH PROPOFOL ;  Surgeon: Lennard Lesta FALCON, MD;  Location: [facility] ENDOSCOPY;  Service: Endoscopy;  Laterality: N/A;   JOINT REPLACEMENT     both knees   LEFT KNEE REPLACEMENT     DR ALUSIO   LEFT TRANSVERSE CARPAL LIGAMENT  01/06/2008   ROTATOR CUFF LEFT SHOULDER  2001   DR MURPHY    SHOULDER OPEN ROTATOR CUFF REPAIR  2006   DR [facility]   SPINE SURGERY     neck fusion   Social History:  reports that he quit smoking about 39 years ago. His smoking use included cigarettes. He quit smokeless tobacco use about 39 years ago. He reports that he  does not drink alcohol and does not use drugs.  Allergies  Allergen Reactions   Ativan  [Lorazepam ] Other (See Comments)    Delirium   Lyrica [Pregabalin] Swelling and Other (See Comments)    Triggered PTSD, Nightmares, Aggression   Zanaflex [Tizanidine Hcl] Swelling and Anxiety   Cymbalta [Duloxetine Hcl] Other (See Comments)    Increased depression, Irritability     Family History  Problem Relation Age of Onset   Cancer Mother 76       cervical cancer   Cancer Father 74       lung  cancer    Prior to Admission medications   Medication Sig Start Date End Date Taking? Authorizing Provider  apixaban  (ELIQUIS ) 5 MG TABS tablet Take 5 mg by mouth 2 (two) times daily.    [provider]  aspirin  EC 81 MG tablet Take 1 tablet (81 mg total) by mouth daily. 08/18/24   Gherghe, Costin M, MD  buprenorphine  (SUBUTEX ) 2 MG SUBL SL tablet Place 2 mg under the tongue 4 (four) times daily as needed (pain). 07/20/24   [provider]  buPROPion  (WELLBUTRIN ) 100 MG tablet Take 1 tablet (100 mg total) by mouth 2 (two) times daily. 08/18/24   Gherghe, Costin M, MD  cefadroxil  (DURICEF) 500 MG capsule Take 1 capsule (500 mg total) by mouth 2 (two) times daily for 4 days. 08/18/24 08/22/24  Trixie Nilda HERO, MD  ferrous sulfate  325 (65 FE) MG tablet Take 325 mg by mouth daily with supper. 03/15/20   [provider]  furosemide  (LASIX ) 40 MG tablet Take 1 tablet (40 mg total) by mouth 2 (two) times daily for 5 days. Patient taking differently: Take 40 mg by mouth daily. 06/18/24 06/23/24  Elnor Jayson LABOR, DO  gabapentin  (NEURONTIN ) 400 MG capsule Take 2 capsules (800 mg total) by mouth 3 (three) times daily. 08/18/24 09/17/24  Trixie Nilda HERO, MD  lidocaine  (LIDODERM ) 5 % Place 1 patch onto the skin daily as needed. Remove & Discard patch within 12 hours or as directed by MD 06/18/24   Elnor Jayson A, DO  metoprolol  succinate (TOPROL -XL) 25 MG 24 hr tablet Take 1 tablet (25 mg total) by mouth  daily. 08/18/24   Gherghe, Costin M, MD  pantoprazole  (PROTONIX ) 40 MG tablet Take 40 mg by mouth 2 (two) times daily.    [provider]  potassium chloride  SA (KLOR-CON  M) 20 MEQ tablet Take 1 tablet (20 mEq total) by mouth daily. 08/18/24 11/16/24  Gherghe, Costin M, MD  rOPINIRole  (REQUIP ) 0.25 MG tablet Take 0.25 mg by mouth 2 (two) times daily.  10/09/18   [provider]  silver sulfADIAZINE (SILVADENE) 1 % cream Apply 1 Application topically 2 (two) times daily. 08/10/24   [provider]  simvastatin  (ZOCOR ) 40 MG tablet Take 1 tablet (40 mg total) by mouth at bedtime. 08/18/24   Gherghe, Costin M, MD  sucralfate  (CARAFATE ) 1 g tablet Take 1 g by mouth 2 (two) times daily.    [provider]  tamsulosin  (FLOMAX ) 0.4 MG CAPS capsule Take 0.4 mg by mouth at bedtime. 09/02/19   [provider]  topiramate  (TOPAMAX ) 25 MG capsule Take 25 mg by mouth 2 (two) times daily.    [provider]    Physical Exam: Vitals:   08/19/24 1512 08/19/24 1917 08/19/24 1926 08/19/24 2130  BP:   115/67   Pulse:    95  Resp:  18  15  Temp:  98.5 F (36.9 C)    TempSrc:  Oral    SpO2:    100%  Weight: 88.5 kg     Height: 5' 9 (1.753 m)      Physical Exam:  General: No acute distress, sleeping, difficult to arouse HEENT: Normocephalic, atraumatic, PERRL Cardiovascular: Normal rate and rhythm. Left Distal pedis pulses intact. Right DP pulse difficult to feel.   Pulmonary: Normal pulmonary effort, normal breath sounds Gastrointestinal: Nondistended abdomen, soft, non-tender, normoactive bowel sounds, no organomegaly Musculoskeletal:Normal ROM, no lower ext edema Skin: Legs are hot to the touch.  Multiple bruises and abrasions on extremities Neuro: diffusely weak PSYCH: disoriented   Data Reviewed:  Results for orders placed or performed during the hospital encounter of 08/19/24 (from the past 24 hours)  CBC with Differential     Status: Abnormal    Collection Time: 08/19/24  2:56 PM  Result Value Ref Range   WBC 13.2 (H) 4.0 - 10.5 K/uL   RBC 4.29 4.22 - 5.81 MIL/uL   Hemoglobin 12.5 (L) 13.0 - 17.0 g/dL   HCT 60.7 60.9 - 47.9 %   MCV 91.4 80.0 - 100.0 fL   MCH 29.1 26.0 - 34.0 pg   MCHC 31.9 30.0 - 36.0 g/dL   RDW 83.3 (H) 88.4 - 84.4 %   Platelets 257 150 - 400 K/uL   nRBC 0.0 0.0 - 0.2 %   Neutrophils Relative % 81 %   Neutro Abs 10.6 (H) 1.7 - 7.7 K/uL   Lymphocytes Relative 7 %   Lymphs Abs 0.9 0.7 - 4.0 K/uL   Monocytes Relative 12 %   Monocytes Absolute 1.6 (H) 0.1 - 1.0 K/uL   Eosinophils Relative 0 %   Eosinophils Absolute 0.0 0.0 - 0.5 K/uL   Basophils Relative 0 %   Basophils Absolute 0.0 0.0 - 0.1 K/uL   Immature Granulocytes 0 %   Abs Immature Granulocytes 0.05 0.00 - 0.07 K/uL  Troponin I (High Sensitivity)     Status: None   Collection Time: 08/19/24  2:56 PM  Result Value Ref Range   Troponin I (High Sensitivity) 12 <18 ng/L  Resp panel by RT-PCR (RSV, Flu A&B, Covid) Anterior Nasal Swab     Status: None   Collection Time: 08/19/24  2:56 PM   Specimen: Anterior Nasal Swab  Result Value Ref Range   SARS Coronavirus 2 by RT PCR NEGATIVE NEGATIVE   Influenza A by PCR NEGATIVE NEGATIVE   Influenza B by PCR NEGATIVE NEGATIVE   Resp Syncytial Virus by PCR NEGATIVE NEGATIVE  Brain natriuretic peptide     Status: Abnormal   Collection Time: 08/19/24  3:30 PM  Result Value Ref Range   B Natriuretic Peptide 197.2 (H) 0.0 - 100.0 pg/mL  I-stat chem  8, ED     Status: Abnormal   Collection Time: 08/19/24  4:06 PM  Result Value Ref Range   Sodium 133 (L) 135 - 145 mmol/L   Potassium 6.0 (H) 3.5 - 5.1 mmol/L   Chloride 98 98 - 111 mmol/L   BUN 60 (H) 8 - 23 mg/dL   Creatinine, Ser 8.79 0.61 - 1.24 mg/dL   Glucose, Bld 895 (H) 70 - 99 mg/dL   Calcium , Ion 1.07 (L) 1.15 - 1.40 mmol/L   TCO2 31 22 - 32 mmol/L   Hemoglobin 13.9 13.0 - 17.0 g/dL   HCT 58.9 60.9 - 47.9 %  I-Stat Lactic Acid     Status: None    Collection Time: 08/19/24  4:06 PM  Result Value Ref Range   Lactic Acid, Venous 1.2 0.5 - 1.9 mmol/L  I-Stat arterial blood gas, ED     Status: Abnormal   Collection Time: 08/19/24  5:03 PM  Result Value Ref Range   pH, Arterial 7.460 (H) 7.35 - 7.45   pCO2 arterial 43.7 32 - 48 mmHg   pO2, Arterial 69 (L) 83 - 108 mmHg   Bicarbonate 31.2 (H) 20.0 - 28.0 mmol/L   TCO2 33 (H) 22 - 32 mmol/L   O2 Saturation 95 %   Acid-Base Excess 6.0 (H) 0.0 - 2.0 mmol/L   Sodium 134 (L) 135 - 145 mmol/L   Potassium 4.0 3.5 - 5.1 mmol/L   Calcium , Ion 1.20 1.15 - 1.40 mmol/L   HCT 37.0 (L) 39.0 - 52.0 %   Hemoglobin 12.6 (L) 13.0 - 17.0 g/dL   Patient temperature 01.9 F    Collection site RADIAL, ALLEN'S TEST ACCEPTABLE    Drawn by RT    Sample type ARTERIAL   Troponin I (High Sensitivity)     Status: None   Collection Time: 08/19/24  7:24 PM  Result Value Ref Range   Troponin I (High Sensitivity) 11 <18 ng/L  Basic metabolic panel with GFR     Status: Abnormal   Collection Time: 08/19/24  7:24 PM  Result Value Ref Range   Sodium 135 135 - 145 mmol/L   Potassium 3.8 3.5 - 5.1 mmol/L   Chloride 96 (L) 98 - 111 mmol/L   CO2 27 22 - 32 mmol/L   Glucose, Bld 94 70 - 99 mg/dL   BUN 44 (H) 8 - 23 mg/dL   Creatinine, Ser 8.91 0.61 - 1.24 mg/dL   Calcium  8.8 (L) 8.9 - 10.3 mg/dL   GFR, Estimated >39 >39 mL/min   Anion gap 12 5 - 15  Urinalysis, Routine w reflex microscopic -Urine, Clean Catch     Status: Abnormal   Collection Time: 08/19/24  9:09 PM  Result Value Ref Range   Color, Urine YELLOW YELLOW   APPearance CLOUDY (A) CLEAR   Specific Gravity, Urine 1.017 1.005 - 1.030   pH 5.0 5.0 - 8.0   Glucose, UA NEGATIVE NEGATIVE mg/dL   Hgb urine dipstick MODERATE (A) NEGATIVE   Bilirubin Urine NEGATIVE NEGATIVE   Ketones, ur NEGATIVE NEGATIVE mg/dL   Protein, ur 30 (A) NEGATIVE mg/dL   Nitrite NEGATIVE NEGATIVE   Leukocytes,Ua LARGE (A) NEGATIVE   RBC / HPF 11-20 0 - 5 RBC/hpf   WBC, UA  >50 0 - 5 WBC/hpf   Bacteria, UA RARE (A) NONE SEEN   Squamous Epithelial / HPF 0-5 0 - 5 /HPF   Mucus PRESENT      Assessment and Plan: Acute delirium -  persistent UTI plus sepsis is one possible cause.  Changes in his buprenorphine  intake are another possibility.  The patient tells me he manages his own medications but is unable to tell me what he takes for pain. - Resume Ceftriaxone  - Await urine culture - He was on Buprenex  3 times daily scheduled during his last hospitalization.  The prescription he has is for Buprenex  as needed.  Will continue the Buprenex  3 times daily scheduled as he was on before - Taper Buprenex  to home regimen once he is able to tell what that is.   2. Afib - Continue Eliquis   3. Htn -continue home metoprolol   4.  BPH -continue tamsulosin   5.  Weakness - PT/ OT   Advance Care Planning:   Code Status: Prior  The patient does not have capacity to make this decision at this time.  He has no family at bedside therefore he will be full code by default.  Consults: none  Family Communication: none  Severity of Illness: The appropriate patient status for this patient is INPATIENT. Inpatient status is judged to be reasonable and necessary in order to provide the required intensity of service to ensure the patient's safety. The patient's presenting symptoms, physical exam findings, and initial radiographic and laboratory data in the context of their chronic comorbidities is felt to place them at high risk for further clinical deterioration. Furthermore, it is not anticipated that the patient will be medically stable for discharge from the hospital within 2 midnights of admission.   * I certify that at the point of admission it is my clinical judgment that the patient will require inpatient hospital care spanning beyond 2 midnights from the point of admission due to high intensity of service, high risk for further deterioration and high frequency of surveillance  required.*  Author: ARTHEA CHILD, MD 08/19/2024 9:48 PM  For on call review [email or web address].

## 2024-08-19 NOTE — ED Triage Notes (Signed)
 Patient BIB EMS from home with AMS. Patient was just discharged yesterday from the hospital with a UTI. Concern for sepsis.

## 2024-08-19 NOTE — ED Notes (Signed)
 Entered room and attempted to have pt provide urine sample. Pad in brief found to be soiled with urine and removed. Attempted to help pt urinate in urinal but pt was upset and I'm 86 I don't need no help going pee. Urinal left on rail per pt request. Pt hooked up to vitals and call light within reach.

## 2024-08-19 NOTE — ED Notes (Signed)
 Sister [name] Conrad [phone] would like an update asap and has information to tell the doctor when available

## 2024-08-19 NOTE — ED Provider Notes (Signed)
 4:15 PM Patient signed out to me by previous ED physician pending workup.   HPI: I spoke with patient's sister, [name], who confirms that the patient was dx with a UTI  on 8/31 with admission and discharge yesterday.  His initial symptoms on presentation where generalized weakness and chest pain.  Chart review demonstrates throughout his stay he was found to have mild confusion on admission which was resolved in the hospital.  He was also found to have urine cultures positive for E. Coli that was resistant to ampicillin , ampicillin  sulbactam, and TMP sulfa.  It was sensitive to ceftriaxone .  His sister states upon discharge the patient was responding to internal stimuli, speaking to himself and laughing, and unable to get out of his car. States that at his baseline he normally walks with a cane, but is alert and oriented.  On my exam of the patient he is alert but confused, poor historian, ill-appearing, but otherwise without focal neurological deficits.  His sister also notes that he was recently taken off of his pain medications...was taking percocet 10-325mg  for several years but was taken off 4 weeks ago. She states he was started Buprenorphine  at that time.   Physical Exam  BP 119/82   Pulse (!) 107   Resp 18   Ht 5' 9 (1.753 m)   Wt 88.5 kg   SpO2 100%   BMI 28.81 kg/m   Physical Exam  Procedures  .Critical Care  Performed by: Elnor Bernarda SQUIBB, DO Authorized by: Elnor Bernarda SQUIBB, DO   Critical care provider statement:    Critical care time (minutes):  79   Critical care was necessary to treat or prevent imminent or life-threatening deterioration of the following conditions:  Sepsis   Critical care was time spent personally by me on the following activities:  Development of treatment plan with patient or surrogate, discussions with consultants, evaluation of patient's response to treatment, examination of patient, ordering and review of laboratory studies, ordering and review of  radiographic studies, ordering and performing treatments and interventions, pulse oximetry, re-evaluation of patient's condition and review of old charts   Care discussed with: admitting provider     ED Course / MDM    Medical Decision Making Amount and/or Complexity of Data Reviewed Labs: ordered. Radiology: ordered.  Risk OTC drugs. Prescription drug management. Decision regarding hospitalization.   4:39 PM Call from radiology-possible subdural, might be artifact. Brain MRI recommended.   CTH stable. MRI stable.   His labs are concerning for a leukocytosis that he did not have upon discharge at 13.2.  Upon discharge yesterday his white blood cell count was 10.2.  Repeat urine is still pending.  Otherwise electrolytes are stable.  Technically he does qualify for sepsis with a pulse rate on arrival 107 with a leukocytosis of 13.2.  Blood cultures were ordered.  No hypotension but IV fluid maintenance were started.  Ceftriaxone  was ordered because his urine cultures previously were sensitive to it.  During previous admission the physician notes did remark the patient was taken off of his Percocets 4 weeks ago and started on buprenorphine .  They also remarked to continue the buprenorphine  on discharge.  I am unable to see whether or not the patient was taking it during his inpatient stay or not.  Dr. Arthea notified and will look into it.  Patient accepted by Dr. Arthea.       Elnor Bernarda SQUIBB, DO 08/19/24 2139

## 2024-08-19 NOTE — ED Provider Notes (Signed)
 [place] EMERGENCY DEPARTMENT AT [facility] Provider Note   CSN: [id number] Arrival date & time: 08/19/24  1439     Patient presents with: Altered Mental Status   [name] is a 83 y.o. male.   83 year old male presents for evaluation of confusion.  He was discharged yesterday from the hospital after being treated for a UTI.  Per EMS patient is more confused today and family was worried about sepsis.  He is awake and alert and she is complaining of neck pain.  He is a poor historian.  States he has had neck and upper back pain as well as chest pain shortness of breath for a few days but is been getting worse.  States he does have a history of chronic neck pain.  Denies any other symptoms or concerns at this time.        Prior to Admission medications   Medication Sig Start Date End Date Taking? Authorizing Provider  apixaban  (ELIQUIS ) 5 MG TABS tablet Take 5 mg by mouth 2 (two) times daily.    [provider]  aspirin  EC 81 MG tablet Take 1 tablet (81 mg total) by mouth daily. 08/18/24   Gherghe, Costin M, MD  buprenorphine  (SUBUTEX ) 2 MG SUBL SL tablet Place 2 mg under the tongue 4 (four) times daily as needed (pain). 07/20/24   [provider]  buPROPion  (WELLBUTRIN ) 100 MG tablet Take 1 tablet (100 mg total) by mouth 2 (two) times daily. 08/18/24   Gherghe, Costin M, MD  cefadroxil  (DURICEF) 500 MG capsule Take 1 capsule (500 mg total) by mouth 2 (two) times daily for 4 days. 08/18/24 08/22/24  Trixie Nilda HERO, MD  ferrous sulfate  325 (65 FE) MG tablet Take 325 mg by mouth daily with supper. 03/15/20   [provider]  furosemide  (LASIX ) 40 MG tablet Take 1 tablet (40 mg total) by mouth 2 (two) times daily for 5 days. Patient taking differently: Take 40 mg by mouth daily. 06/18/24 06/23/24  Elnor Jayson LABOR, DO  gabapentin  (NEURONTIN ) 400 MG capsule Take 2 capsules (800 mg total) by mouth 3 (three) times daily. 08/18/24 09/17/24  Gherghe, Costin M, MD   lidocaine  (LIDODERM ) 5 % Place 1 patch onto the skin daily as needed. Remove & Discard patch within 12 hours or as directed by MD 06/18/24   Elnor Jayson LABOR, DO  metoprolol  succinate (TOPROL -XL) 25 MG 24 hr tablet Take 1 tablet (25 mg total) by mouth daily. 08/18/24   Gherghe, Costin M, MD  pantoprazole  (PROTONIX ) 40 MG tablet Take 40 mg by mouth 2 (two) times daily.    [provider]  potassium chloride  SA (KLOR-CON  M) 20 MEQ tablet Take 1 tablet (20 mEq total) by mouth daily. 08/18/24 11/16/24  Gherghe, Costin M, MD  rOPINIRole  (REQUIP ) 0.25 MG tablet Take 0.25 mg by mouth 2 (two) times daily.  10/09/18   [provider]  silver sulfADIAZINE (SILVADENE) 1 % cream Apply 1 Application topically 2 (two) times daily. 08/10/24   [provider]  simvastatin  (ZOCOR ) 40 MG tablet Take 1 tablet (40 mg total) by mouth at bedtime. 08/18/24   Gherghe, Costin M, MD  sucralfate  (CARAFATE ) 1 g tablet Take 1 g by mouth 2 (two) times daily.    [provider]  tamsulosin  (FLOMAX ) 0.4 MG CAPS capsule Take 0.4 mg by mouth at bedtime. 09/02/19   [provider]  topiramate  (TOPAMAX ) 25 MG capsule Take 25 mg by mouth 2 (two)  times daily.    [provider]    Allergies: Ativan  [lorazepam ], Lyrica [pregabalin], Zanaflex [tizanidine hcl], and Cymbalta [duloxetine hcl]    Review of Systems  Reason unable to perform ROS: patient is a poor historian.    Updated Vital Signs BP 119/82   Pulse (!) 107   Resp 18   Ht 5' 9 (1.753 m)   Wt 88.5 kg   SpO2 100%   BMI 28.81 kg/m   Physical Exam Vitals and nursing note reviewed.  Constitutional:      General: He is not in acute distress.    Appearance: He is well-developed. He is ill-appearing.  HENT:     Head: Normocephalic and atraumatic.  Eyes:     Conjunctiva/sclera: Conjunctivae normal.  Cardiovascular:     Rate and Rhythm: Normal rate and regular rhythm.     Heart sounds: No murmur heard. Pulmonary:      Effort: Pulmonary effort is normal. No respiratory distress.     Breath sounds: Normal breath sounds.  Abdominal:     Palpations: Abdomen is soft.     Tenderness: There is no abdominal tenderness.  Musculoskeletal:        General: Tenderness present. No swelling.     Cervical back: Neck supple.     Comments: Bilateral neck musculature tenderness to palpation   Skin:    General: Skin is warm and dry.     Capillary Refill: Capillary refill takes less than 2 seconds.  Neurological:     General: No focal deficit present.     Mental Status: He is alert.  Psychiatric:        Mood and Affect: Mood normal.     (all labs ordered are listed, but only abnormal results are displayed) Labs Reviewed  RESP PANEL BY RT-PCR (RSV, FLU A&B, COVID)  [id number]  CBC WITH DIFFERENTIAL/PLATELET  BRAIN NATRIURETIC PEPTIDE  URINALYSIS, ROUTINE W REFLEX MICROSCOPIC  I-STAT CHEM 8, ED  I-STAT CG4 LACTIC ACID, ED  TROPONIN I (HIGH SENSITIVITY)    EKG: EKG Interpretation Date/Time:  Wednesday August 19 2024 15:03:01 EDT Ventricular Rate:  112 PR Interval:    QRS Duration:  89 QT Interval:  320 QTC Calculation: 429 R Axis:   85  Text Interpretation: Atrial fibrillation Anterior infarct, old No change when compared to prior EKG from 08/17/2024 Confirmed by Gennaro Bouchard ([id number]) on 08/19/2024 3:06:26 PM  Radiology: [facility] Chest 1 View Result Date: 08/19/2024 CLINICAL DATA:  Altered mental status.  Shortness of breath.  Chest EXAM: CHEST  1 VIEW COMPARISON:  Radiograph dated 08/16/2024. FINDINGS: No focal consolidation, pleural effusion, or pneumothorax. Stable cardiomegaly. Degenerative changes of the spine. No acute osseous pathology. IMPRESSION: 1. No active disease. 2. Cardiomegaly. Electronically Signed   By: Vanetta Chou M.D.   On: 08/19/2024 15:33     Procedures   Medications Ordered in the ED  acetaminophen  (TYLENOL ) tablet 650 mg (has no administration in time range)                                     Medical Decision Making Cardiac monitor interpretation: Sinus rhythm, no ectopy  Social determinants of health: Patient is confused, not a good historian and there is no family here with him to help report history  Patient here for reported altered mental status.  He is awake and alert and does answer some questions appropriately but is a poor  historian.  He is just complaining of neck pain.  Vitals are stable.  He is on his baseline nasal cannula which he uses at home.  Workup is pending at this time.  I signed out patient to oncoming provider at 4:30 PM pending remainder of workup and ultimate disposition.  Problems Addressed: Altered mental status, unspecified altered mental status type: undiagnosed new problem with uncertain prognosis Neck pain, chronic: chronic illness or injury  Amount and/or Complexity of Data Reviewed Independent Historian: EMS    Details: Patient here for altered mental status.  This was reported by EMS and they state family was concerned patient had sepsis External Data Reviewed: notes.    Details: Hospital records reviewed and patient was discharged yesterday on antibiotics for urinary tract infection Labs: ordered. Decision-making details documented in ED Course.    Details: Ordered and pending Radiology: ordered and independent interpretation performed. Decision-making details documented in ED Course.    Details: Chest x-ray ordered and interpreted independently radiology: Shows evidence of no acute process  CT head is pending ECG/medicine tests: ordered and independent interpretation performed. Decision-making details documented in ED Course.    Details: Ordered and interpreted by me in the absence of cardiology and shows atrial fibrillation, which is patient's baseline, this is not a change when compared to prior  Risk OTC drugs. Prescription drug management. Diagnosis or treatment significantly limited by social determinants of  health.     Final diagnoses:  Altered mental status, unspecified altered mental status type  Neck pain, chronic    ED Discharge Orders     None          Gennaro Duwaine CROME, DO 08/19/24 1551

## 2024-08-20 ENCOUNTER — Inpatient Hospital Stay (HOSPITAL_COMMUNITY)

## 2024-08-20 LAB — BASIC METABOLIC PANEL WITH GFR
Anion gap: 14 (ref 5–15)
BUN: 40 mg/dL — ABNORMAL HIGH (ref 8–23)
CO2: 25 mmol/L (ref 22–32)
Calcium: 8.9 mg/dL (ref 8.9–10.3)
Chloride: 96 mmol/L — ABNORMAL LOW (ref 98–111)
Creatinine, Ser: 1.04 mg/dL (ref 0.61–1.24)
GFR, Estimated: >60 mL/min (ref >60)
Glucose, Bld: 91 mg/dL (ref 70–99)
Potassium: 4.2 mmol/L (ref 3.5–5.1)
Sodium: 135 mmol/L (ref 135–145)

## 2024-08-20 LAB — CBC
HCT: 37.2 % — ABNORMAL LOW (ref 39.0–52.0)
Hemoglobin: 11.9 g/dL — ABNORMAL LOW (ref 13.0–17.0)
MCH: 29.5 pg (ref 26.0–34.0)
MCHC: 32 g/dL (ref 30.0–36.0)
MCV: 92.1 fL (ref 80.0–100.0)
Platelets: 220 K/uL (ref 150–400)
RBC: 4.04 MIL/uL — ABNORMAL LOW (ref 4.22–5.81)
RDW: 16.6 % — ABNORMAL HIGH (ref 11.5–15.5)
WBC: 11.2 K/uL — ABNORMAL HIGH (ref 4.0–10.5)
nRBC: 0 % (ref 0.0–0.2)

## 2024-08-20 LAB — TSH: TSH: 2.114 u[IU]/mL (ref 0.350–4.500)

## 2024-08-20 MED ORDER — LIDOCAINE 5 % EX PTCH
1.0000 | MEDICATED_PATCH | Freq: Every day | CUTANEOUS | Status: DC
  Administered 2024-08-20 – 2024-08-22 (×3): 1 via TRANSDERMAL
  Filled 2024-08-20 (×5): qty 1

## 2024-08-20 MED ORDER — FERROUS SULFATE 325 (65 FE) MG PO TABS
325.0000 mg | ORAL_TABLET | Freq: Two times a day (BID) | ORAL | Status: DC
  Administered 2024-08-20 – 2024-08-24 (×8): 325 mg via ORAL
  Filled 2024-08-20 (×8): qty 1

## 2024-08-20 MED ORDER — BUPRENORPHINE HCL 2 MG SL SUBL
2.0000 mg | SUBLINGUAL_TABLET | Freq: Four times a day (QID) | SUBLINGUAL | Status: DC | PRN
  Administered 2024-08-20 – 2024-08-21 (×2): 2 mg via SUBLINGUAL
  Filled 2024-08-20 (×2): qty 1

## 2024-08-20 MED ORDER — BUPROPION HCL 100 MG PO TABS
100.0000 mg | ORAL_TABLET | Freq: Two times a day (BID) | ORAL | Status: DC
  Administered 2024-08-20 – 2024-08-24 (×9): 100 mg via ORAL
  Filled 2024-08-20 (×10): qty 1

## 2024-08-20 NOTE — ED Notes (Signed)
 Pt stated he urinated on himself. This tech changed sheets, gown, and offered warm blanket. Pt asked why have they not given me a catheter yet?

## 2024-08-20 NOTE — ED Notes (Signed)
 Condom catheter applied onto patient

## 2024-08-20 NOTE — Care Plan (Signed)
 Patient arrived to the unit AAOX 1 Complaining of pain 8/10 in the arms No signs/symptoms of distress Skin assessment done with Rocky, RN  Sonny, RN

## 2024-08-20 NOTE — ED Notes (Signed)
 Added to CCMD

## 2024-08-20 NOTE — Progress Notes (Signed)
 PROGRESS NOTE  [name]  DOB: [birth year]  PCP: Burney Darice LITTIE, MD FMW:[id number]  DOA: 08/19/2024  LOS: 1 day  Hospital Day: 2  Brief narrative: [name] is a 83 y.o. male with PMH significant for HTN, permanent A-fib on Eliquis , stroke, patent foreman ovale, severe AS s/p TAVR, reflux esophagitis, anemia, spinal stenosis, schizoaffective disorder, chronic buprenorphine  use who was just hospitalized 8/31-9/2 for UTI, generalized weakness and was discharged home on antibiotics. Patient returned back to ED next day 9/3 with complaint of confusion, generalized weakness, chest pain.  It seems that during his stay in the hospital, patient had weakness and mild confusion.  Physical therapy evaluation was obtained and recommended home.  Urine culture grew E. coli, sensitive to IV Rocephin  and was discharged on oral cefadroxil .   Patient's sister [name] reported that since discharge, patient remained confused, weak.  She was concerned of recurrence of sepsis and hence brought him back to ED.  In the ED, patient was afebrile, heart rate in 90s and 100s, blood pressure normal range, breathing on room air Labs with WBC count 13.2 sodium 133, potassium elevated to 6, BUN/creatinine 60/1.2, lactic acid not elevated. ABG showed pH alkalotic at 7.46, pCO2 44, bicarb 31 Chest x-ray unremarkable for acute disease EKG with A-fib at the rate of 112 bpm, no ST-T wave changes Respiratory virus panel unremarkable Urinalysis showed cloudy yellow urine with moderate hemoglobin, large leukocytes, rare bacteria Blood culture was sent CT head showed parenchymal atrophy and chronic small vessel ischemic disease. MRI brain did not show any acute intracranial abnormality but showed chronic small vessel ischemic disease and generalized atrophy.  Admitted to [facility]  Subjective: Patient was seen and examined this morning. Elderly Caucasian male.  Lying on bed.  Was complaining of persistent pain on left  shoulder Chart reviewed Overnight, remains afebrile, heart rate in 90s, blood pressure in low normal range, breathing on room air Most recent labs from this morning with WC count 11.2 sodium 135, potassium 4.2, renal function better  Assessment and plan: Recurrent UTI Recently hospitalized for E. coli UTI, treated with IV Rocephin  and discharged on cefadroxil  Presented within 24 hours with worsening confusion. Repeat urinalysis showed cloudy urine with large leukocytes, rare bacteria.  Suspect incomplete treatment of the UTI On admission, IV Rocephin  was resumed. Monitor for temperature and WBC trend. Recent Labs  Lab 08/16/24 1048 08/17/24 0320 08/18/24 0330 08/19/24 1456 08/19/24 1606 08/20/24 0311  WBC 11.4* 10.2 10.2 13.2*  --  11.2*  LATICACIDVEN  --   --   --   --  1.2  --    Acute metabolic encephalopathy Probably underlying vascular dementia Patient has risk factors for vascular dementia namely HTN, A-fib.  During the hospitalization, he had mild delirium.  Confusion did not clear up postdischarge and has brought back to the ED. Recurrent UTIs probably contributing to altered mentation. Continue to monitor mental status change.  Hyperkalemia Her potassium level was elevated to 6 at presentation.  Unclear if it is true as a repeat potassium level within an hour was normal.  Potassium level remains normal this morning Recent Labs  Lab 08/16/24 1048 08/17/24 0320 08/18/24 0330 08/19/24 1606 08/19/24 1703 08/19/24 1924 08/20/24 0311  K 2.7*   < > 3.7 6.0* 4.0 3.8 4.2  MG 2.1  --  2.2  --   --   --   --    < > = values in this interval not displayed.   Permanent A-fib  PTA meds- metoprolol  succinate 25 mg daily Chronically anticoagulated with Eliquis  5 mg twice daily  Chronic diastolic CHF  hypertension PTA meds- Lasix  40 mg daily Not volume overloaded currently.   I would hold off Lasix  for now. Continue monitor blood pressure and volume status.  HLD PTA  meds- aspirin  81 mg daily, simvastatin  40 mg nightly, Resume both  BPH Continue Flomax   Chronic pain Anxiety/depression PTA meds- Subutex , lidocaine  patch, Wellbutrin  100 mg twice daily, Seroquel  25 mg nightly, Requip  0.25 mg twice daily, Topamax  25 mg twice daily Per history, patient was switched from Percocet to buprenorphine  about a month ago. Resume Subutex , lidocaine  patch, Wellbutrin , Requip , Topamax .  Hold Seroquel    Mobility:  PT Orders:   PT Follow up Rec:    Goals of care   Code Status: Full Code     DVT prophylaxis:   apixaban  (ELIQUIS ) tablet 5 mg   Antimicrobials: IV Rocephin  Fluid: None Consultants: None Family Communication: None at bedside  Status: Inpatient Level of care:  Telemetry Medical   Patient is from: Home Needs to continue in-hospital care: Ongoing workup Anticipated d/c to: Pending clinical course    Diet:  Diet Order             Diet regular Room service appropriate? Yes; Fluid consistency: Thin  Diet effective now                   Scheduled Meds:  apixaban   5 mg Oral BID   aspirin  EC  81 mg Oral Daily   buPROPion   100 mg Oral BID   ferrous sulfate   325 mg Oral BID WC   lidocaine   1 patch Transdermal Daily   metoprolol  succinate  25 mg Oral Daily   rOPINIRole   0.25 mg Oral BID   simvastatin   40 mg Oral QHS   tamsulosin   0.4 mg Oral QHS   topiramate   25 mg Oral BID    PRN meds: acetaminophen  **OR** acetaminophen , bisacodyl , buprenorphine , melatonin, ondansetron  **OR** ondansetron  (ZOFRAN ) IV   Infusions:   cefTRIAXone  (ROCEPHIN )  IV      Antimicrobials: Anti-infectives (From admission, onward)    Start     Dose/Rate Route Frequency Ordered Stop   08/20/24 2200  cefTRIAXone  (ROCEPHIN ) 1 g in sodium chloride  0.9 % 100 mL IVPB        1 g 200 mL/hr over 30 Minutes Intravenous Daily at bedtime 08/19/24 2336     08/19/24 2045  cefTRIAXone  (ROCEPHIN ) 1 g in sodium chloride  0.9 % 100 mL IVPB        1 g 200 mL/hr  over 30 Minutes Intravenous  Once 08/19/24 2036 08/19/24 2237       Objective: Vitals:   08/20/24 1019 08/20/24 1020  BP:  113/68  Pulse:  93  Resp:  (!) 22  Temp: 97.9 F (36.6 C)   SpO2:  97%   No intake or output data in the 24 hours ending 08/20/24 1353 Filed Weights   08/19/24 1512  Weight: 88.5 kg   Weight change:  Body mass index is 28.81 kg/m.   Physical Exam: General exam: Pleasant, elderly Caucasian male.  Mild distress from pain Skin: No rashes, lesions or ulcers. HEENT: Atraumatic, normocephalic, no obvious bleeding Lungs: Clear to auscultation bilaterally,  CVS: S1, S2, no murmur,   GI/Abd: Soft, nontender, nondistended, bowel sound present,   CNS: Alert, awake, oriented to place only but able to give the details of history. Psychiatry: Sad affect Extremities: No pedal  edema, no calf tenderness,   Data Review: I have personally reviewed the laboratory data and studies available.  F/u labs ordered Unresulted Labs (From admission, onward)     Start     Ordered   08/21/24 0500  CBC with Differential/Platelet  Tomorrow morning,   R        08/20/24 1353   08/21/24 0500  Basic metabolic panel with GFR  Tomorrow morning,   R        08/20/24 1353   08/19/24 2037  Urine Culture  Once,   URGENT       Question:  Indication  Answer:  Altered mental status (if no other cause identified)   08/19/24 2036           Signed, Chapman Rota, MD [facility] 08/20/2024

## 2024-08-20 NOTE — Evaluation (Signed)
 Physical Therapy Evaluation Patient Details Name: [name] MRN: [id number] DOB: Dec 26, 1940 Today's Date: 08/20/2024  History of Present Illness  [name] is a 83 y.o. male who was just hospitalized 8/31-9/2 for UTI, generalized weakness and was discharged home on antibiotics. Patient returned back to ED next day 9/3 with complaint of confusion, generalized weakness, chest pain. PMH significant for HTN, permanent A-fib on Eliquis , stroke, patent foreman ovale, severe AS s/p TAVR, reflux esophagitis, anemia, spinal stenosis, schizoaffective disorder, chronic buprenorphine  use.  Clinical Impression  Pt presents with admitting diagnosis above. Eval today very limited due to pain. Pt required Mod/Max A to sit EOB however was unable to stand due to intense BUE pain. PTA pt reports he was Mod I with SPC. Patient will benefit from continued inpatient follow up therapy, <3 hours/day. PT will continue to follow.         If plan is discharge home, recommend the following: A lot of help with walking and/or transfers;A little help with bathing/dressing/bathroom;Assistance with cooking/housework   Can travel by private vehicle   No    Equipment Recommendations None recommended by PT  Recommendations for Other Services  OT consult    Functional Status Assessment Patient has had a recent decline in their functional status and/or demonstrates limited ability to make significant improvements in function in a reasonable and predictable amount of time     Precautions / Restrictions Precautions Precautions: Fall Recall of Precautions/Restrictions: Impaired Restrictions Weight Bearing Restrictions Per Provider Order: No      Mobility  Bed Mobility Overal bed mobility: Needs Assistance Bed Mobility: Supine to Sit, Sit to Supine     Supine to sit: Mod assist Sit to supine: Max assist   General bed mobility comments: Mod/Max A for bed mobility with heavy use of bed pads. Pt unable to use  BUEs due to pain.    Transfers                   General transfer comment: Unable due to pain.    Ambulation/Gait                  Stairs            Wheelchair Mobility     Tilt Bed    Modified Rankin (Stroke Patients Only)       Balance Overall balance assessment: Needs assistance Sitting-balance support: No upper extremity supported, Feet supported Sitting balance-Leahy Scale: Fair                                       Pertinent Vitals/Pain Pain Assessment Pain Assessment: Faces Faces Pain Scale: Hurts even more Pain Location: L arm Pain Descriptors / Indicators: Aching, Constant, Grimacing, Discomfort, Guarding Pain Intervention(s): Monitored during session, Patient requesting pain meds-RN notified, Limited activity within patient's tolerance    Home Living Family/patient expects to be discharged to:: Private residence Living Arrangements: Other relatives (Sister) Available Help at Discharge: Family;Available 24 hours/day Type of Home: House Home Access: Level entry       Home Layout: One level Home Equipment: Wheelchair - Forensic psychologist (2 wheels);Cane - single point;Rollator (4 wheels)      Prior Function Prior Level of Function : Independent/Modified Independent             Mobility Comments: reports ambulating with cane ADLs Comments: reports driving and independence  Extremity/Trunk Assessment   Upper Extremity Assessment Upper Extremity Assessment: Difficult to assess due to impaired cognition (Extreme pain with BUE)    Lower Extremity Assessment Lower Extremity Assessment: Generalized weakness    Cervical / Trunk Assessment Cervical / Trunk Assessment: Kyphotic  Communication   Communication Communication: Impaired Factors Affecting Communication: Difficulty expressing self    Cognition Arousal: Alert Behavior During Therapy: Restless   PT - Cognitive impairments:  Safety/Judgement, Problem solving                       PT - Cognition Comments: Pt A&Ox3. Slow processing noted and poor safety awareness. Following commands: Impaired Following commands impaired: Follows one step commands inconsistently     Cueing Cueing Techniques: Verbal cues, Gestural cues     General Comments General comments (skin integrity, edema, etc.): RN present and notified that pt condom cath had come off prior to PT arrival.    Exercises     Assessment/Plan    PT Assessment Patient needs continued PT services  PT Problem List Decreased strength;Decreased activity tolerance;Decreased balance;Decreased mobility;Decreased safety awareness;Pain       PT Treatment Interventions DME instruction;Gait training;Functional mobility training;Therapeutic activities;Therapeutic exercise;Patient/family education    PT Goals (Current goals can be found in the Care Plan section)  Acute Rehab PT Goals Patient Stated Goal: to not be in pain anymore PT Goal Formulation: With patient Time For Goal Achievement: 09/03/24 Potential to Achieve Goals: Fair    Frequency Min 2X/week     Co-evaluation               AM-PAC PT 6 Clicks Mobility  Outcome Measure Help needed turning from your back to your side while in a flat bed without using bedrails?: A Lot Help needed moving from lying on your back to sitting on the side of a flat bed without using bedrails?: A Lot Help needed moving to and from a bed to a chair (including a wheelchair)?: Total Help needed standing up from a chair using your arms (e.g., wheelchair or bedside chair)?: Total Help needed to walk in hospital room?: Total Help needed climbing 3-5 steps with a railing? : Total 6 Click Score: 8    End of Session   Activity Tolerance: Patient limited by pain Patient left: in bed;with call bell/phone within reach;with bed alarm set Nurse Communication: Mobility status PT Visit Diagnosis: Other  abnormalities of gait and mobility (R26.89);Difficulty in walking, not elsewhere classified (R26.2);Muscle weakness (generalized) (M62.81)    Time: 8496-8468 PT Time Calculation (min) (ACUTE ONLY): 28 min   Charges:   PT Evaluation $PT Eval Moderate Complexity: 1 Mod PT Treatments $Therapeutic Activity: 8-22 mins PT General Charges $$ ACUTE PT VISIT: 1 Visit         Sueellen NOVAK, PT, DPT Acute Rehab Services [phone]   Venida Tsukamoto 08/20/2024, 3:48 PM

## 2024-08-21 ENCOUNTER — Inpatient Hospital Stay (HOSPITAL_COMMUNITY)

## 2024-08-21 DIAGNOSIS — I2583 Coronary atherosclerosis due to lipid rich plaque: Secondary | ICD-10-CM

## 2024-08-21 DIAGNOSIS — I2510 Atherosclerotic heart disease of native coronary artery without angina pectoris: Secondary | ICD-10-CM

## 2024-08-21 LAB — BASIC METABOLIC PANEL WITH GFR
Anion gap: 14 (ref 5–15)
BUN: 37 mg/dL — ABNORMAL HIGH (ref 8–23)
CO2: 27 mmol/L (ref 22–32)
Calcium: 9 mg/dL (ref 8.9–10.3)
Chloride: 95 mmol/L — ABNORMAL LOW (ref 98–111)
Creatinine, Ser: 1.1 mg/dL (ref 0.61–1.24)
GFR, Estimated: >60 mL/min (ref >60)
Glucose, Bld: 96 mg/dL (ref 70–99)
Potassium: 4 mmol/L (ref 3.5–5.1)
Sodium: 136 mmol/L (ref 135–145)

## 2024-08-21 LAB — CBC WITH DIFFERENTIAL/PLATELET
Abs Immature Granulocytes: 0.03 K/uL (ref 0.00–0.07)
Basophils Absolute: 0 K/uL (ref 0.0–0.1)
Basophils Relative: 0 %
Eosinophils Absolute: 0.1 K/uL (ref 0.0–0.5)
Eosinophils Relative: 2 %
HCT: 34.1 % — ABNORMAL LOW (ref 39.0–52.0)
Hemoglobin: 11.3 g/dL — ABNORMAL LOW (ref 13.0–17.0)
Immature Granulocytes: 0 %
Lymphocytes Relative: 12 %
Lymphs Abs: 1 K/uL (ref 0.7–4.0)
MCH: 29.3 pg (ref 26.0–34.0)
MCHC: 33.1 g/dL (ref 30.0–36.0)
MCV: 88.3 fL (ref 80.0–100.0)
Monocytes Absolute: 0.9 K/uL (ref 0.1–1.0)
Monocytes Relative: 12 %
Neutro Abs: 5.9 K/uL (ref 1.7–7.7)
Neutrophils Relative %: 74 %
Platelets: 255 K/uL (ref 150–400)
RBC: 3.86 MIL/uL — ABNORMAL LOW (ref 4.22–5.81)
RDW: 16.1 % — ABNORMAL HIGH (ref 11.5–15.5)
WBC: 8 K/uL (ref 4.0–10.5)
nRBC: 0 % (ref 0.0–0.2)

## 2024-08-21 MED ORDER — PANTOPRAZOLE SODIUM 40 MG PO TBEC
40.0000 mg | DELAYED_RELEASE_TABLET | Freq: Every day | ORAL | Status: DC
  Administered 2024-08-21 – 2024-08-23 (×3): 40 mg via ORAL
  Filled 2024-08-21 (×3): qty 1

## 2024-08-21 MED ORDER — ACETAMINOPHEN 500 MG PO TABS
1000.0000 mg | ORAL_TABLET | Freq: Three times a day (TID) | ORAL | Status: DC
  Administered 2024-08-21 – 2024-08-24 (×10): 1000 mg via ORAL
  Filled 2024-08-21 (×10): qty 2

## 2024-08-21 MED ORDER — KETOROLAC TROMETHAMINE 30 MG/ML IJ SOLN
30.0000 mg | Freq: Three times a day (TID) | INTRAMUSCULAR | Status: DC | PRN
  Administered 2024-08-21 – 2024-08-22 (×2): 30 mg via INTRAVENOUS
  Filled 2024-08-21 (×2): qty 1

## 2024-08-21 NOTE — Progress Notes (Signed)
   08/21/24 0943  Mobility  Activity Dangled on edge of bed  Level of Assistance Maximum assist, patient does 25-49%  Assistive Device None  Activity Response Tolerated fair  Mobility Referral Yes  Mobility visit 1 Mobility  Mobility Specialist Start Time (ACUTE ONLY) 0943  Mobility Specialist Stop Time (ACUTE ONLY) 1004  Mobility Specialist Time Calculation (min) (ACUTE ONLY) 21 min   Mobility Specialist: Progress Note  Pre-Mobility:      HR 97, SpO2 94% RA Post-Mobility:    HR 102, SpO2 95% RA  Pt agreeable to mobility session - received in bed. Pt yelling with movement, guarded RUE from shoulder to hand. Required heavy redirecting to get back in bed. Returned to chair position in bed with all needs met - call bell within reach. Bed alarm on.   Virgle Boards, BS Mobility Specialist Please contact via SecureChat or  Rehab office at [phone].

## 2024-08-21 NOTE — TOC Initial Note (Addendum)
 Transition of Care [facility]) - Initial/Assessment Note    Patient Details  Name: [name] MRN: [id number] Date of Birth: 1941/02/17  Transition of Care [facility]) CM/SW Contact:    Inocente GORMAN Kindle, LCSW Phone Number: 08/21/2024, 12:22 PM  Clinical Narrative:                 12:15pm: CSW received consult for possible SNF placement at time of discharge. CSW spoke with patient. Patient reported that he lives with his sister. He stated he does not want to go to SNF but asked why home health could not come to his house. He gave CSW permission to speak with his sister to confirm plan. CSW contacted sister on both numbers; was able to leave a secure voicemail on [phone]. Patient was set up a few days ago with [facility] and has all needed DME (cane, walker, rollabout and electric w/chair).   4:04 PM-CSW received return call from patient's sister. She stated that she wishes patient would go to SNF as she cannot handle him at home but states that he will not agree to go and she cannot tell him so. She stated that they live in her house and he is urinating all over. CSW asked if she had any other family that could help support her in telling the patient that he needs rehab but she stated there was not anyone else. CSW explained that CSW cannot make patient go to SNF if he refuses as he is Ox4, but provided her with the patient's VA social worker contact number to see if patient qualifies for any in home assistance. Sister is requesting patient remain in the hospital as long as possible to see if he can get stronger prior to returning home as she cannot assist him (she has breast cancer herself). CSW updated MD.     Skilled Nursing Rehab Facilities-   [email or web address]   Ratings out of 5 stars (5 the highest)  Name Address  Phone # Quality Care Staffing Health Inspection Overall  [facility] [address], [place] [phone] 2 1 2 1   [facility] [address], [place] [phone] 5 2 4 5   [facility] [address], [place] [phone] 2 1 1 1   [facility] [address] [place] [phone] 4 3 4 4   [facility]  [address], [place] [phone] 4 3 5 5   [facility] [address] [facility] [phone] 5 3 2 3   [facility] [address] [place] [phone] 5 1 2 2   [facility] [address], [place] [phone] 3 4 4 4   [facility] ([facility]) [address] [place] [phone] 1 3 3 2   [facility] [address] [place], [place] [phone] 4 2 2 2   [facility] ([facility]) [address], [place] [phone] 2 1 1 1   Clotilda Pereyra [address] [place] [phone] 2 4 4 4   [facility] [address] [place] [phone] 3 1 3 2   [facility]) [address], [place] [phone] 1 2 4 3           [facility] [address], [place] [phone] 3 1 5 4   [facility] [address], [place] [phone] 4 2 1 1   [facility]  [address] [place] [phone] 2 4 1 1   [facility] [address] [phone] 2 2 5 5   [facility]  [address], [place] [phone] 2 2 3 3           [facility] [address] [place] [phone] 2 1 2 1   [facility] (No U[facility]) [address], [place] [phone] 4 3 4 4   [facility] [address] [facility] [phone] 3  5 5   [facility] [address] [place] [phone] 4 2 1 1   [place] [address] [place] [phone] 3 1 2 1   [facility] [address], [place] [phone] 1 3 3 2   [facility] [address] [place] [phone] 2 2 3 3   [facility] [address] [place], [place] [phone] 2 1 4 3   [facility] [address], [facility]  [phone] 2 1 1 1   [facility] [address] [place], [place] [phone] 2 1 2 1   [facility] [address] [place] [phone] 3 1 2 1           [facility] [address], [place] [phone] 4 1 3 2   [facility] [address], [place]  [phone] 2 4 4 4   [facility] (No Humana) [address] [place] [phone] 3 2 5 5   [facility]) [address], [place] [phone] 3 2 3 3   [facility] [place] [address] [place] [phone] 5 3 5 5   [facility] [address] [place], [place] [phone] 2 1 1 1   [facility] [address] [place], [place] [phone] 3 5 5 5           [facility] [address] [place], [place] [phone] 5 4 5 5   [facility])  [address], [place] [phone] 1 1 2 1   [facility]) [address] [place] [phone]  2 4 4   [facility] [place] [address], [place] [phone] 3 5 5 5   [address] [place], [place] [phone] 4 2 2 2   [facility]) [address] [place] [phone] 1 1 3 1   [facility] [address] [place] [phone] 2 2 2 2        Barriers to Discharge: Continued Medical Work up   Patient Goals and CMS Choice Patient states their goals for this hospitalization and ongoing recovery are:: return home with sister CMS Medicare.gov Compare Post Acute Care list provided to:: Patient Choice offered to / list presented to : Patient [place] ownership interest in [facility].provided to:: Patient    Expected Discharge Plan and Services In-house Referral: Clinical Social Work Discharge Planning Services: CM Consult Post Acute Care Choice: Home Health Living arrangements for the past 2 months: Single Family Home                                      Prior Living Arrangements/Services Living arrangements for the  past 2 months: Single Family Home Lives with:: Siblings (Sister) Patient language and need for interpreter reviewed:: Yes        Need for Family Participation in Patient Care: Yes (Comment) Care giver support system in place?: Yes (comment) Current home services: DME (cane, walker, Rollabout, electric w/chair) Criminal Activity/Legal Involvement Pertinent to Current Situation/Hospitalization: No - Comment as needed  Activities of Daily Living      Permission Sought/Granted  Share Information with NAME: [name]  Permission granted to share info w AGENCY: [facility]  Permission granted to share info w Relationship: Sister  Permission granted to share info w Contact Information: [phone]  [phone]  Emotional Assessment Appearance:: Appears stated age Attitude/Demeanor/Rapport: Engaged Affect (typically observed): Accepting, Appropriate Orientation: : Oriented to Self, Oriented to Place, Oriented to  Time, Oriented to Situation Alcohol / Substance Use: Not Applicable Psych Involvement: No (comment)  Admission diagnosis:  Encephalopathy acute [G93.40] Acute cystitis without hematuria [N30.00] Altered mental status, unspecified altered mental status type [R41.82] Patient Active Problem List   Diagnosis Date Noted   Encephalopathy acute 08/19/2024   Generalized weakness 08/16/2024   Acute on chronic respiratory failure with hypoxia (HCC) 05/27/2024   Persistent atrial fibrillation (HCC) 05/27/2024   Elevated troponin 05/27/2024   CAD (coronary artery disease) 05/27/2024   Hyperlipidemia 05/27/2024   BPH (benign prostatic hyperplasia) 05/27/2024   Dizziness 05/26/2024   Aspiration pneumonia (HCC) 03/16/2024   Normocytic anemia 03/16/2024   Acute exacerbation of CHF (congestive heart failure) (HCC) 03/15/2024   Atrial fibrillation with RVR (HCC) 01/30/2024   Rib fracture 11/30/2023   Pneumothorax 11/30/2023   PNA (pneumonia) 10/27/2023   Community acquired pneumonia of  left lower lobe of lung 10/26/2023   Vitamin B 12 deficiency 03/22/2022   Schizoaffective disorder (HCC) 03/22/2022   Hypotension 11/04/2021   Laceration of forehead 11/04/2021   Chronic diastolic CHF (congestive heart failure) (HCC) 11/04/2021   Renal cyst 11/04/2021   Hyperkalemia 11/04/2021   Fall 11/03/2021   Palliative care by specialist    Goals of care, counseling/discussion    UTI (urinary tract infection) 11/10/2019   AF (paroxysmal atrial fibrillation) (HCC) 11/10/2019   Acute blood loss anemia 11/10/2019   Cellulitis 11/10/2019   AKI (acute kidney injury) (HCC) 11/10/2019   History of CVA (cerebrovascular accident) 11/10/2019   Abdominal pain 10/07/2019   Pressure injury of skin 10/07/2019   Atrial fibrillation with rapid ventricular response (HCC) 10/06/2019   Cerebral infarction (HCC) 09/22/2019   S/P cervical spinal fusion 08/04/2019   Acute encephalopathy    GI bleeding 10/13/2018   Acute metabolic encephalopathy 10/13/2018   Acute renal failure (ARF) (HCC) 10/13/2018   S/P TAVR (transcatheter aortic valve replacement) 09/20/2018   Permanent atrial fibrillation (HCC) 09/20/2018   Acute on chronic diastolic CHF (congestive heart failure) (HCC) 09/18/2018   Heme positive stool 11/20/2017   Gait disorder 03/24/2013   Cervical post-laminectomy syndrome 03/26/2012   Depression 03/26/2012   Degenerative arthritis of shoulder region 03/26/2012   KNEE REPLACEMENT, BILATERAL, HX OF 06/04/2007   CELLULITIS/ABSCESS, ARM 05/05/2007   Anxiety state 04/08/2007   Chronic pain 04/08/2007   OSTEOARTHROSIS, GENERALIZED, MULTIPLE SITES 04/08/2007   SPONDYLOLISTHESIS 04/08/2007   INCONTINENCE, URGE 04/08/2007   HYPERCHOLESTEROLEMIA 03/07/2007   HYPERTENSION, BENIGN 03/07/2007   PCP:  [name], MD Pharmacy:   [facility] [address] [place] [facility] [address] [place] [address] Phone: [phone] Fax: [phone]  [facility]  [place] [facility] [address] [place] [address] Phone: [phone] Fax: [phone]  [facility] [address] [place] [address] Phone: [phone] Fax: [phone]  [id number] [place], [place] - [address] [facility] [address] [place] [address] Phone: [phone] Fax: [phone]     Social Drivers of Health (SDOH) Social History: SDOH Screenings   Food Insecurity: No Food Insecurity (08/16/2024)  Housing: Low Risk  (08/16/2024)  Transportation Needs: No Transportation Needs (08/16/2024)  Utilities: Not At Risk (08/16/2024)  Social Connections: Unknown (08/16/2024)  Tobacco Use: Medium Risk (08/19/2024)   SDOH Interventions:     Readmission Risk Interventions    03/16/2024   12:59 PM 01/31/2024   11:58 AM 10/27/2023   11:13 AM  Readmission Risk Prevention Plan  Transportation Screening Complete Complete Complete  PCP or Specialist Appt within 5-7 Days Complete Complete Complete  Home Care Screening Complete Complete Complete  Medication Review (RN CM) Complete Complete Complete

## 2024-08-21 NOTE — [place] FL2 (Signed)
 [place]  MEDICAID FL2 LEVEL OF CARE FORM     IDENTIFICATION  Patient Name: [name] Birthdate: 23-Jul-1941 Sex: male Admission Date (Current Location): 08/19/2024  [facility] [place] Number:  Producer, television/film/video and Address:  The . [facility], [address], [place], [place] [address]      Provider Number: [id number]  Attending Physician Name and Address:  Raenelle Donalda HERO, MD  Relative Name and Phone Number:       Current Level of Care: Hospital Recommended Level of Care: Skilled Nursing Facility Prior Approval Number:    Date Approved/Denied:   PASRR Number: [id number]  Discharge Plan: SNF    Current Diagnoses: Patient Active Problem List   Diagnosis Date Noted   Encephalopathy acute 08/19/2024   Generalized weakness 08/16/2024   Acute on chronic respiratory failure with hypoxia (HCC) 05/27/2024   Persistent atrial fibrillation (HCC) 05/27/2024   Elevated troponin 05/27/2024   CAD (coronary artery disease) 05/27/2024   Hyperlipidemia 05/27/2024   BPH (benign prostatic hyperplasia) 05/27/2024   Dizziness 05/26/2024   Aspiration pneumonia (HCC) 03/16/2024   Normocytic anemia 03/16/2024   Acute exacerbation of CHF (congestive heart failure) (HCC) 03/15/2024   Atrial fibrillation with RVR (HCC) 01/30/2024   Rib fracture 11/30/2023   Pneumothorax 11/30/2023   PNA (pneumonia) 10/27/2023   Community acquired pneumonia of left lower lobe of lung 10/26/2023   Vitamin B 12 deficiency 03/22/2022   Schizoaffective disorder (HCC) 03/22/2022   Hypotension 11/04/2021   Laceration of forehead 11/04/2021   Chronic diastolic CHF (congestive heart failure) (HCC) 11/04/2021   Renal cyst 11/04/2021   Hyperkalemia 11/04/2021   Fall 11/03/2021   Palliative care by specialist    Goals of care, counseling/discussion    UTI (urinary tract infection) 11/10/2019   AF (paroxysmal atrial fibrillation) (HCC) 11/10/2019   Acute blood loss anemia 11/10/2019    Cellulitis 11/10/2019   AKI (acute kidney injury) (HCC) 11/10/2019   History of CVA (cerebrovascular accident) 11/10/2019   Abdominal pain 10/07/2019   Pressure injury of skin 10/07/2019   Atrial fibrillation with rapid ventricular response (HCC) 10/06/2019   Cerebral infarction (HCC) 09/22/2019   S/P cervical spinal fusion 08/04/2019   Acute encephalopathy    GI bleeding 10/13/2018   Acute metabolic encephalopathy 10/13/2018   Acute renal failure (ARF) (HCC) 10/13/2018   S/P TAVR (transcatheter aortic valve replacement) 09/20/2018   Permanent atrial fibrillation (HCC) 09/20/2018   Acute on chronic diastolic CHF (congestive heart failure) (HCC) 09/18/2018   Heme positive stool 11/20/2017   Gait disorder 03/24/2013   Cervical post-laminectomy syndrome 03/26/2012   Depression 03/26/2012   Degenerative arthritis of shoulder region 03/26/2012   KNEE REPLACEMENT, BILATERAL, HX OF 06/04/2007   CELLULITIS/ABSCESS, ARM 05/05/2007   Anxiety state 04/08/2007   Chronic pain 04/08/2007   OSTEOARTHROSIS, GENERALIZED, MULTIPLE SITES 04/08/2007   SPONDYLOLISTHESIS 04/08/2007   INCONTINENCE, URGE 04/08/2007   HYPERCHOLESTEROLEMIA 03/07/2007   HYPERTENSION, BENIGN 03/07/2007    Orientation RESPIRATION BLADDER Height & Weight     Self, Time, Situation, Place  Normal Incontinent, External catheter Weight: 195 lb 1.7 oz (88.5 kg) Height:  5' 9 (175.3 cm)  BEHAVIORAL SYMPTOMS/MOOD NEUROLOGICAL BOWEL NUTRITION STATUS      Continent Diet (See dc summary)  AMBULATORY STATUS COMMUNICATION OF NEEDS Skin   Extensive Assist Verbally Normal                       Personal Care Assistance Level of Assistance  Bathing, Feeding, Dressing Bathing Assistance: Maximum assistance Feeding assistance: Limited assistance Dressing Assistance: Maximum assistance     Functional Limitations Info             SPECIAL CARE FACTORS FREQUENCY  PT (By licensed PT), OT (By licensed OT)     PT  Frequency: 5x/week OT Frequency: 5x/week            Contractures Contractures Info: Not present    Additional Factors Info  Code Status, Allergies Code Status Info: Full Allergies Info: Ativan  (Lorazepam ), Lyrica (Pregabalin), Zanaflex (Tizanidine Hcl), Cymbalta (Duloxetine Hcl)           Current Medications (08/21/2024):  This is the current hospital active medication list Current Facility-Administered Medications  Medication Dose Route Frequency Provider Last Rate Last Admin   acetaminophen  (TYLENOL ) tablet 1,000 mg  1,000 mg Oral Q8H Ghimire, Donalda HERO, MD   1,000 mg at 08/21/24 0856   apixaban  (ELIQUIS ) tablet 5 mg  5 mg Oral BID Claiborne, Claudia, MD   5 mg at 08/21/24 0856   aspirin  EC tablet 81 mg  81 mg Oral Daily Claiborne, Claudia, MD   81 mg at 08/21/24 0856   bisacodyl  (DULCOLAX) EC tablet 5 mg  5 mg Oral Daily PRN Claiborne, Claudia, MD       buprenorphine  (SUBUTEX ) SL tablet 2 mg  2 mg Sublingual QID PRN Dahal, Binaya, MD   2 mg at 08/21/24 0734   buPROPion  (WELLBUTRIN ) tablet 100 mg  100 mg Oral BID Dahal, Binaya, MD   100 mg at 08/21/24 0856   cefTRIAXone  (ROCEPHIN ) 1 g in sodium chloride  0.9 % 100 mL IVPB  1 g Intravenous QHS Raenelle Donalda HERO, MD   Stopped at 08/20/24 2229   ferrous sulfate  tablet 325 mg  325 mg Oral BID WC Dahal, Binaya, MD   325 mg at 08/21/24 0856   ketorolac  (TORADOL ) 30 MG/ML injection 30 mg  30 mg Intravenous Q8H PRN Raenelle Donalda HERO, MD   30 mg at 08/21/24 0856   lidocaine  (LIDODERM ) 5 % 1 patch  1 patch Transdermal Daily Dahal, Chapman, MD   1 patch at 08/21/24 0857   melatonin tablet 3 mg  3 mg Oral QHS PRN Claiborne, Claudia, MD   3 mg at 08/20/24 2136   metoprolol  succinate (TOPROL -XL) 24 hr tablet 25 mg  25 mg Oral Daily Claiborne, Claudia, MD       ondansetron  (ZOFRAN ) tablet 4 mg  4 mg Oral Q6H PRN Arthea Child, MD       Or   ondansetron  (ZOFRAN ) injection 4 mg  4 mg Intravenous Q6H PRN Arthea Child, MD        pantoprazole  (PROTONIX ) EC tablet 40 mg  40 mg Oral Q1200 Ghimire, Shanker M, MD   40 mg at 08/21/24 1242   rOPINIRole  (REQUIP ) tablet 0.25 mg  0.25 mg Oral BID Claiborne, Claudia, MD   0.25 mg at 08/21/24 9143   simvastatin  (ZOCOR ) tablet 40 mg  40 mg Oral QHS Claiborne, Claudia, MD   40 mg at 08/20/24 2136   tamsulosin  (FLOMAX ) capsule 0.4 mg  0.4 mg Oral QHS Claiborne, Claudia, MD   0.4 mg at 08/20/24 2136   topiramate  (TOPAMAX ) tablet 25 mg  25 mg Oral BID Claiborne, Claudia, MD   25 mg at 08/21/24 9143     Discharge Medications: Please see discharge summary for a list of discharge medications.  Relevant Imaging Results:  Relevant Lab Results:   Additional Information [id number]  Wanetta Funderburke S Akif Weldy, LCSW

## 2024-08-21 NOTE — Plan of Care (Signed)

## 2024-08-21 NOTE — Progress Notes (Signed)
 PROGRESS NOTE        PATIENT DETAILS Name: [name] Age: 83 y.o. Sex: male Date of Birth: 1941/12/07 Admit Date: 08/19/2024 Admitting Physician Will Bernard, MD ERE:Mpryuzm, Darice LITTIE, MD  Brief Summary: Patient is a 83 y.o.  male with history of chronic pain syndrome on narcotics, A-fib, CVA, HTN-who was just discharged from the hospital on 9/2 after being treated for UTI/evaluation of chest pain-presented with confusion.  Significant events: 8/31-9/2>> hospitalization for evaluation of chest pain/UTI. 9/03>> admit to [facility]-confusion.  Significant studies: 9/3>> MRI brain: No acute intracranial abnormality. 9/4>> x-ray left shoulder: No acute abnormality 9/4>> x-ray right shoulder: No acute abnormality.  Significant microbiology data: 8/31>> urine culture: E. coli. 9/03>> COVID/influenza/RSV PCR: Negative 9/03>> blood culture: No growth  Procedures: None  Consults: None  Subjective: Awake/alert complains of right wrist pain-asking for oxycodone .  Poor historian-tells me he is not on Subutex  at home-no family at bedside.  Objective: Vitals: Blood pressure (!) 108/50, pulse 97, temperature 98.2 F (36.8 C), temperature source Oral, resp. rate 12, height 5' 9 (1.753 m), weight 88.5 kg, SpO2 93%.   Exam: Gen Exam: Frail-chronically sick-not in any distress. HEENT:atraumatic, normocephalic Chest: B/L clear to auscultation anteriorly CVS:S1S2 regular Abdomen:soft non tender, non distended Extremities:no edema.  Right wrist-no erythema or swelling.  Tender when squeezed. Neurology: Non focal Skin: no rash  Pertinent Labs/Radiology:    Latest Ref Rng & Units 08/21/2024    5:31 AM 08/20/2024    3:11 AM 08/19/2024    5:03 PM  CBC  WBC 4.0 - 10.5 K/uL 8.0  11.2    Hemoglobin 13.0 - 17.0 g/dL 88.6  88.0  87.3   Hematocrit 39.0 - 52.0 % 34.1  37.2  37.0   Platelets 150 - 400 K/uL 255  220      Lab Results  Component Value Date   NA 136  08/21/2024   K 4.0 08/21/2024   CL 95 (L) 08/21/2024   CO2 27 08/21/2024      Assessment/Plan: Acute toxic metabolic encephalopathy Suspect etiology more from polypharmacy (on narcotics/multiple psychotropics)-rather than UTI Overall improved-more awake/alert this morning-answering simple questions appropriately Complete Rocephin  x 7 days total Difficult situation-apparently has been on narcotics for years-recently switched by pain management from oxycodone /Tylenol  to buprenorphine .  Complicated UTI Afebrile Encephalopathy improving Rocephin  x 7 days total Frequent bladder scans to ensure no retention  Chronic pain syndrome Avoid addition of other narcotics Continue buprenorphine  Add scheduled Tylenol , add as needed Toradol . Right wrist pain this a.m.-exam benign-obtain x-ray-will check uric acid with a.m. labs.  Permanent A-fib Telemetry monitoring Metoprolol /Eliquis   Chronic HFpEF Euvolemic As needed Lasix  based on exam.  HLD Statin  BPH Flomax  See above regarding frequent bladder scans.  Anxiety/depression On multiple medications-since mental status improving-continue Wellbutrin /Topamax /Requip  Seroquel  on hold-resume over the next Avril days.  Code status:   Code Status: Full Code   DVT Prophylaxis: apixaban  (ELIQUIS ) tablet 5 mg    Family Communication: Sister-[phone] updated 9/5   Disposition Plan: Status is: Inpatient Remains inpatient appropriate because: Severity of illness   Planned Discharge Destination:Skilled nursing facility   Diet: Diet Order             Diet regular Room service appropriate? Yes; Fluid consistency: Thin  Diet effective now  Antimicrobial agents: Anti-infectives (From admission, onward)    Start     Dose/Rate Route Frequency Ordered Stop   08/20/24 2200  cefTRIAXone  (ROCEPHIN ) 1 g in sodium chloride  0.9 % 100 mL IVPB        1 g 200 mL/hr over 30 Minutes Intravenous Daily at  bedtime 08/19/24 2336     08/19/24 2045  cefTRIAXone  (ROCEPHIN ) 1 g in sodium chloride  0.9 % 100 mL IVPB        1 g 200 mL/hr over 30 Minutes Intravenous  Once 08/19/24 2036 08/19/24 2237        MEDICATIONS: Scheduled Meds:  acetaminophen   1,000 mg Oral Q8H   apixaban   5 mg Oral BID   aspirin  EC  81 mg Oral Daily   buPROPion   100 mg Oral BID   ferrous sulfate   325 mg Oral BID WC   lidocaine   1 patch Transdermal Daily   metoprolol  succinate  25 mg Oral Daily   pantoprazole   40 mg Oral Q1200   rOPINIRole   0.25 mg Oral BID   simvastatin   40 mg Oral QHS   tamsulosin   0.4 mg Oral QHS   topiramate   25 mg Oral BID   Continuous Infusions:  cefTRIAXone  (ROCEPHIN )  IV Stopped (08/20/24 2229)   PRN Meds:.bisacodyl , buprenorphine , ketorolac , melatonin, ondansetron  **OR** ondansetron  (ZOFRAN ) IV   I have personally reviewed following labs and imaging studies  LABORATORY DATA: CBC: Recent Labs  Lab 08/17/24 0320 08/18/24 0330 08/19/24 1456 08/19/24 1606 08/19/24 1703 08/20/24 0311 08/21/24 0531  WBC 10.2 10.2 13.2*  --   --  11.2* 8.0  NEUTROABS  --   --  10.6*  --   --   --  5.9  HGB 12.2* 11.7* 12.5* 13.9 12.6* 11.9* 11.3*  HCT 38.3* 36.9* 39.2 41.0 37.0* 37.2* 34.1*  MCV 91.8 91.6 91.4  --   --  92.1 88.3  PLT 217 238 257  --   --  220 255    Basic Metabolic Panel: Recent Labs  Lab 08/16/24 1048 08/17/24 0320 08/18/24 0330 08/19/24 1606 08/19/24 1703 08/19/24 1924 08/20/24 0311 08/21/24 0531  NA 135 139 136 133* 134* 135 135 136  K 2.7* 2.9* 3.7 6.0* 4.0 3.8 4.2 4.0  CL 87* 93* 93* 98  --  96* 96* 95*  CO2 33* 33* 34*  --   --  27 25 27   GLUCOSE 120* 102* 97 104*  --  94 91 96  BUN 26* 23 30* 60*  --  44* 40* 37*  CREATININE 1.13 1.04 1.11 1.20  --  1.08 1.04 1.10  CALCIUM  9.6 9.1 9.0  --   --  8.8* 8.9 9.0  MG 2.1  --  2.2  --   --   --   --   --     GFR: Estimated Creatinine Clearance: 57 mL/min (by C-G formula based on SCr of 1.1 mg/dL).  Liver  Function Tests: Recent Labs  Lab 08/16/24 1048 08/17/24 0320 08/18/24 0330  AST 24 19 25   ALT 18 16 17   ALKPHOS 100 82 74  BILITOT 1.0 0.8 0.6  PROT 8.6* 7.4 6.9  ALBUMIN 3.6 2.9* 2.6*   No results for input(s): LIPASE, AMYLASE in the last 168 hours. No results for input(s): AMMONIA in the last 168 hours.  Coagulation Profile: No results for input(s): INR, PROTIME in the last 168 hours.  Cardiac Enzymes: No results for input(s): CKTOTAL, CKMB, CKMBINDEX, TROPONINI in the last 168 hours.  BNP (last 3 results) Recent Labs    05/26/24 1648 06/18/24 1720  PROBNP 3,172.0* 2,730.0*    Lipid Profile: No results for input(s): CHOL, HDL, LDLCALC, TRIG, CHOLHDL, LDLDIRECT in the last 72 hours.  Thyroid  Function Tests: Recent Labs    08/20/24 0311  TSH 2.114    Anemia Panel: No results for input(s): VITAMINB12, FOLATE, FERRITIN, TIBC, IRON, RETICCTPCT in the last 72 hours.  Urine analysis:    Component Value Date/Time   COLORURINE YELLOW 08/19/2024 2109   APPEARANCEUR CLOUDY (A) 08/19/2024 2109   LABSPEC 1.017 08/19/2024 2109   PHURINE 5.0 08/19/2024 2109   GLUCOSEU NEGATIVE 08/19/2024 2109   HGBUR MODERATE (A) 08/19/2024 2109   HGBUR negative 04/08/2007 1327   BILIRUBINUR NEGATIVE 08/19/2024 2109   KETONESUR NEGATIVE 08/19/2024 2109   PROTEINUR 30 (A) 08/19/2024 2109   UROBILINOGEN 0.2 04/24/2010 1617   NITRITE NEGATIVE 08/19/2024 2109   LEUKOCYTESUR LARGE (A) 08/19/2024 2109    Sepsis Labs: Lactic Acid, Venous    Component Value Date/Time   LATICACIDVEN 1.2 08/19/2024 1606    MICROBIOLOGY: Recent Results (from the past 240 hours)  Resp panel by RT-PCR (RSV, Flu A&B, Covid) Anterior Nasal Swab     Status: None   Collection Time: 08/16/24 10:49 AM   Specimen: Anterior Nasal Swab  Result Value Ref Range Status   SARS Coronavirus 2 by RT PCR NEGATIVE NEGATIVE Final   Influenza A by PCR NEGATIVE NEGATIVE Final    Influenza B by PCR NEGATIVE NEGATIVE Final    Comment: (NOTE) The Xpert Xpress SARS-CoV-2/FLU/RSV plus assay is intended as an aid in the diagnosis of influenza from Nasopharyngeal swab specimens and should not be used as a sole basis for treatment. Nasal washings and aspirates are unacceptable for Xpert Xpress SARS-CoV-2/FLU/RSV testing.  Fact Sheet for Patients: BloggerCourse.com  Fact Sheet for Healthcare Providers: SeriousBroker.it  This test is not yet approved or cleared by the [place]  FDA and has been authorized for detection and/or diagnosis of SARS-CoV-2 by FDA under an Emergency Use Authorization (EUA). This EUA will remain in effect (meaning this test can be used) for the duration of the COVID-19 declaration under Section 564(b)(1) of the Act, 21 U.S.C. section 360bbb-3(b)(1), unless the authorization is terminated or revoked.     Resp Syncytial Virus by PCR NEGATIVE NEGATIVE Final    Comment: (NOTE) Fact Sheet for Patients: BloggerCourse.com  Fact Sheet for Healthcare Providers: SeriousBroker.it  This test is not yet approved or cleared by the [place]  FDA and has been authorized for detection and/or diagnosis of SARS-CoV-2 by FDA under an Emergency Use Authorization (EUA). This EUA will remain in effect (meaning this test can be used) for the duration of the COVID-19 declaration under Section 564(b)(1) of the Act, 21 U.S.C. section 360bbb-3(b)(1), unless the authorization is terminated or revoked.  Performed at [facility], [address], [place], [place] [address]   Urine Culture     Status: Abnormal   Collection Time: 08/16/24  3:29 PM   Specimen: Urine, Clean Catch  Result Value Ref Range Status   Specimen Description URINE, CLEAN CATCH  Final   Special Requests   Final    NONE Performed at [facility], [address]  [place], [place] [address]    Culture >=100,000 COLONIES/mL ESCHERICHIA COLI (A)  Final   Report Status 08/18/2024 FINAL  Final   Organism ID, Bacteria ESCHERICHIA COLI (A)  Final      Susceptibility  Escherichia coli - MIC*    AMPICILLIN  >=32 RESISTANT Resistant     CEFAZOLIN (URINE) Value in next row Sensitive      4 SENSITIVEThis is a modified FDA-approved test that has been validated and its performance characteristics determined by the reporting laboratory.  This laboratory is certified under the Clinical Laboratory Improvement Amendments CLIA as qualified to perform high complexity clinical laboratory testing.    CEFEPIME Value in next row Sensitive      4 SENSITIVEThis is a modified FDA-approved test that has been validated and its performance characteristics determined by the reporting laboratory.  This laboratory is certified under the Clinical Laboratory Improvement Amendments CLIA as qualified to perform high complexity clinical laboratory testing.    ERTAPENEM Value in next row Sensitive      4 SENSITIVEThis is a modified FDA-approved test that has been validated and its performance characteristics determined by the reporting laboratory.  This laboratory is certified under the Clinical Laboratory Improvement Amendments CLIA as qualified to perform high complexity clinical laboratory testing.    CEFTRIAXONE  Value in next row Sensitive      4 SENSITIVEThis is a modified FDA-approved test that has been validated and its performance characteristics determined by the reporting laboratory.  This laboratory is certified under the Clinical Laboratory Improvement Amendments CLIA as qualified to perform high complexity clinical laboratory testing.    CIPROFLOXACIN Value in next row Sensitive      4 SENSITIVEThis is a modified FDA-approved test that has been validated and its performance characteristics determined by the reporting laboratory.  This laboratory is certified under the Clinical Laboratory  Improvement Amendments CLIA as qualified to perform high complexity clinical laboratory testing.    GENTAMICIN Value in next row Sensitive      4 SENSITIVEThis is a modified FDA-approved test that has been validated and its performance characteristics determined by the reporting laboratory.  This laboratory is certified under the Clinical Laboratory Improvement Amendments CLIA as qualified to perform high complexity clinical laboratory testing.    NITROFURANTOIN Value in next row Sensitive      4 SENSITIVEThis is a modified FDA-approved test that has been validated and its performance characteristics determined by the reporting laboratory.  This laboratory is certified under the Clinical Laboratory Improvement Amendments CLIA as qualified to perform high complexity clinical laboratory testing.    TRIMETH/SULFA Value in next row Resistant      4 SENSITIVEThis is a modified FDA-approved test that has been validated and its performance characteristics determined by the reporting laboratory.  This laboratory is certified under the Clinical Laboratory Improvement Amendments CLIA as qualified to perform high complexity clinical laboratory testing.    AMPICILLIN /SULBACTAM Value in next row Resistant      4 SENSITIVEThis is a modified FDA-approved test that has been validated and its performance characteristics determined by the reporting laboratory.  This laboratory is certified under the Clinical Laboratory Improvement Amendments CLIA as qualified to perform high complexity clinical laboratory testing.    PIP/TAZO Value in next row Sensitive ug/mL     <=4 SENSITIVEThis is a modified FDA-approved test that has been validated and its performance characteristics determined by the reporting laboratory.  This laboratory is certified under the Clinical Laboratory Improvement Amendments CLIA as qualified to perform high complexity clinical laboratory testing.    MEROPENEM Value in next row Sensitive      <=4  SENSITIVEThis is a modified FDA-approved test that has been validated and its performance characteristics determined by the reporting laboratory.  This laboratory is certified under the Clinical Laboratory Improvement Amendments CLIA as qualified to perform high complexity clinical laboratory testing.    * >=100,000 COLONIES/mL ESCHERICHIA COLI  Resp panel by RT-PCR (RSV, Flu A&B, Covid) Anterior Nasal Swab     Status: None   Collection Time: 08/19/24  2:56 PM   Specimen: Anterior Nasal Swab  Result Value Ref Range Status   SARS Coronavirus 2 by RT PCR NEGATIVE NEGATIVE Final   Influenza A by PCR NEGATIVE NEGATIVE Final   Influenza B by PCR NEGATIVE NEGATIVE Final    Comment: (NOTE) The Xpert Xpress SARS-CoV-2/FLU/RSV plus assay is intended as an aid in the diagnosis of influenza from Nasopharyngeal swab specimens and should not be used as a sole basis for treatment. Nasal washings and aspirates are unacceptable for Xpert Xpress SARS-CoV-2/FLU/RSV testing.  Fact Sheet for Patients: BloggerCourse.com  Fact Sheet for Healthcare Providers: SeriousBroker.it  This test is not yet approved or cleared by the [place]  FDA and has been authorized for detection and/or diagnosis of SARS-CoV-2 by FDA under an Emergency Use Authorization (EUA). This EUA will remain in effect (meaning this test can be used) for the duration of the COVID-19 declaration under Section 564(b)(1) of the Act, 21 U.S.C. section 360bbb-3(b)(1), unless the authorization is terminated or revoked.     Resp Syncytial Virus by PCR NEGATIVE NEGATIVE Final    Comment: (NOTE) Fact Sheet for Patients: BloggerCourse.com  Fact Sheet for Healthcare Providers: SeriousBroker.it  This test is not yet approved or cleared by the [place]  FDA and has been authorized for detection and/or diagnosis of SARS-CoV-2 by FDA under an  Emergency Use Authorization (EUA). This EUA will remain in effect (meaning this test can be used) for the duration of the COVID-19 declaration under Section 564(b)(1) of the Act, 21 U.S.C. section 360bbb-3(b)(1), unless the authorization is terminated or revoked.  Performed at [facility], [address] [place], [place] [address]   Blood culture (routine x 2)     Status: None (Preliminary result)   Collection Time: 08/19/24  9:37 PM   Specimen: BLOOD  Result Value Ref Range Status   Specimen Description BLOOD SITE NOT SPECIFIED  Final   Special Requests   Final    BOTTLES DRAWN AEROBIC AND ANAEROBIC Blood Culture results may not be optimal due to an inadequate volume of blood received in culture bottles   Culture   Final    NO GROWTH 2 DAYS Performed at [facility], [address], [place], [place] [address]    Report Status PENDING  Incomplete  Blood culture (routine x 2)     Status: None (Preliminary result)   Collection Time: 08/19/24  9:37 PM   Specimen: BLOOD  Result Value Ref Range Status   Specimen Description BLOOD SITE NOT SPECIFIED  Final   Special Requests   Final    BOTTLES DRAWN AEROBIC AND ANAEROBIC Blood Culture results may not be optimal due to an inadequate volume of blood received in culture bottles   Culture   Final    NO GROWTH 2 DAYS Performed at [facility], [address] [place], [place] [address]    Report Status PENDING  Incomplete    RADIOLOGY STUDIES/RESULTS: DG Shoulder Left Result Date: 08/20/2024 CLINICAL DATA:  Pain. EXAM: LEFT SHOULDER - 2+ VIEW COMPARISON:  Left shoulder radiographs dated 06/22/2022. FINDINGS: Diffuse osseous demineralization. No acute fracture or dislocation. Severe glenohumeral joint osteoarthritis with advanced joint space narrowing  and marginal osteophytosis, progressed since the prior exam. Findings suggestive of chronic rotator cuff pathology. Mild acromioclavicular joint osteoarthritis. Partially  visualized postsurgical changes of the cervical and thoracic spine. IMPRESSION: 1. No acute osseous abnormality. 2. Severe glenohumeral joint osteoarthritis with findings suggestive of chronic rotator cuff pathology. 3. Mild acromioclavicular joint osteoarthritis. Electronically Signed   By: Harrietta Sherry M.D.   On: 08/20/2024 17:58   DG Shoulder Right Result Date: 08/20/2024 CLINICAL DATA:  Pain. EXAM: RIGHT SHOULDER - 2+ VIEW COMPARISON:  None Available. FINDINGS: Diffuse osseous demineralization. No acute fracture or dislocation. Severe glenohumeral joint osteoarthritis with advanced joint space narrowing and marginal osteophytosis. Suture anchor in the right humeral head. Findings suggestive of chronic rotator cuff pathology. Moderate degenerative arthropathy of the acromioclavicular joint. Partially visualized postoperative changes of the cervical spine. IMPRESSION: 1. No acute osseous abnormality. 2. Severe glenohumeral joint osteoarthritis with findings suggestive of chronic rotator cuff pathology. 3. Moderate acromioclavicular joint osteoarthritis. Electronically Signed   By: Harrietta Sherry M.D.   On: 08/20/2024 17:56   MR BRAIN WO CONTRAST Result Date: 08/19/2024 EXAM: MRI BRAIN WITHOUT CONTRAST 08/19/2024 93:76:75 PM TECHNIQUE: Multiplanar multisequence MRI of the head/brain was performed without the administration of intravenous contrast. COMPARISON: 05/27/2024 CLINICAL HISTORY: CTH for confussion demonstrated possible subdural. mri was recommend by rad. FINDINGS: BRAIN AND VENTRICLES: No acute infarct. No intracranial hemorrhage. No mass. No midline shift. No hydrocephalus. The sella is unremarkable. Normal flow voids. Generalized volume loss most apparent in the frontal lobes. Multifocal hyperintense T2-weighted signal within the cerebral white matter, most commonly due to chronic small vessel disease. ORBITS: No acute abnormality. SINUSES AND MASTOIDS: No acute abnormality. BONES AND SOFT  TISSUES: Normal marrow signal. No acute soft tissue abnormality. IMPRESSION: 1. No acute intracranial abnormality. 2. Generalized volume loss most apparent in the frontal lobes. 3. Multifocal hyperintense T2-weighted signal within the cerebral white matter, most commonly due to chronic small vessel disease. Electronically signed by: Franky Stanford MD 08/19/2024 07:58 PM EDT RP Workstation: [id number]   CT Head Wo Contrast Result Date: 08/19/2024 CLINICAL DATA:  Provided history: Altered mental status. EXAM: CT HEAD WITHOUT CONTRAST TECHNIQUE: Contiguous axial images were obtained from the base of the skull through the vertex without intravenous contrast. RADIATION DOSE REDUCTION: This exam was performed according to the departmental dose-optimization program which includes automated exposure control, adjustment of the mA and/or kV according to patient size and/or use of iterative reconstruction technique. COMPARISON:  Brain MRI 05/27/2024. Non-contrast head CT and CT angiogram head/neck 08/19/2024. FINDINGS: Streak/beam hardening artifact arising from a craniocervical fusion construct partially obscures the posterior fossa. Within this limitation, findings are as follows. Brain: Generalized cerebral atrophy. A small intermediate-to-low density subdural collection is questioned along the anterolateral right frontal lobe, measuring up to 5 mm in thickness (for instance as seen on series 3, image 10) (series 4, images 51 and 52). Patchy and ill-defined hypoattenuation within the cerebral white matter, nonspecific but compatible with mild chronic small vessel ischemic disease. No demarcated cortical infarct. No evidence of an intracranial mass. No midline shift. Vascular: No hyperdense vessel.  Atherosclerotic calcifications. Skull: No calvarial fracture or aggressive osseous lesion. Partially imaged craniocervical fusion construct. Sinuses/Orbits: No mass or acute finding within the imaged orbits. No significant  paranasal sinus disease at the imaged levels. Impression #2 called by telephone at the time of interpretation on 08/19/2024 at 4:38 pm to provider Dr. Elnor, who verbally acknowledged these results. IMPRESSION: 1. Streak/beam hardening artifact arising from a craniocervical fusion  construct partially obscures the posterior fossa. Within this limitation, findings are as follows. 2. A small intermediate-to-low density subdural collection is questioned along the anterolateral right frontal lobe (versus artifact). Consider a brain MRI for further evaluation. No midline shift or significant mass effect. 3. Parenchymal atrophy and chronic small vessel ischemic disease. Electronically Signed   By: Rockey Childs D.O.   On: 08/19/2024 16:42   DG Chest 1 View Result Date: 08/19/2024 CLINICAL DATA:  Altered mental status.  Shortness of breath.  Chest EXAM: CHEST  1 VIEW COMPARISON:  Radiograph dated 08/16/2024. FINDINGS: No focal consolidation, pleural effusion, or pneumothorax. Stable cardiomegaly. Degenerative changes of the spine. No acute osseous pathology. IMPRESSION: 1. No active disease. 2. Cardiomegaly. Electronically Signed   By: Vanetta Chou M.D.   On: 08/19/2024 15:33     LOS: 2 days   Donalda Applebaum, MD  [facility]    To contact the attending provider between 7A-7P or the covering provider during after hours 7P-7A, please log into the web site [email or web address] and access using universal [place] password for that web site. If you do not have the password, please call the hospital operator.  08/21/2024, 9:52 AM

## 2024-08-22 LAB — CBC
HCT: 33.5 % — ABNORMAL LOW (ref 39.0–52.0)
Hemoglobin: 10.9 g/dL — ABNORMAL LOW (ref 13.0–17.0)
MCH: 29.4 pg (ref 26.0–34.0)
MCHC: 32.5 g/dL (ref 30.0–36.0)
MCV: 90.3 fL (ref 80.0–100.0)
Platelets: 255 K/uL (ref 150–400)
RBC: 3.71 MIL/uL — ABNORMAL LOW (ref 4.22–5.81)
RDW: 16.3 % — ABNORMAL HIGH (ref 11.5–15.5)
WBC: 6.1 K/uL (ref 4.0–10.5)
nRBC: 0 % (ref 0.0–0.2)

## 2024-08-22 LAB — BASIC METABOLIC PANEL WITH GFR
Anion gap: 15 (ref 5–15)
BUN: 45 mg/dL — ABNORMAL HIGH (ref 8–23)
CO2: 27 mmol/L (ref 22–32)
Calcium: 8.9 mg/dL (ref 8.9–10.3)
Chloride: 95 mmol/L — ABNORMAL LOW (ref 98–111)
Creatinine, Ser: 1.24 mg/dL (ref 0.61–1.24)
GFR, Estimated: 58 mL/min — ABNORMAL LOW (ref >60)
Glucose, Bld: 89 mg/dL (ref 70–99)
Potassium: 4 mmol/L (ref 3.5–5.1)
Sodium: 137 mmol/L (ref 135–145)

## 2024-08-22 LAB — URIC ACID: Uric Acid, Serum: 6.6 mg/dL (ref 3.7–8.6)

## 2024-08-22 NOTE — Plan of Care (Signed)

## 2024-08-22 NOTE — Progress Notes (Signed)
 PROGRESS NOTE        PATIENT DETAILS Name: [name] Age: 83 y.o. Sex: male Date of Birth: 1941/02/07 Admit Date: 08/19/2024 Admitting Physician Will Bernard, MD ERE:Mpryuzm, Darice LITTIE, MD  Brief Summary: Patient is a 83 y.o.  male with history of chronic pain syndrome on narcotics, A-fib, CVA, HTN-who was just discharged from the hospital on 9/2 after being treated for UTI/evaluation of chest pain-presented with confusion.  Significant events: 8/31-9/2>> hospitalization for evaluation of chest pain/UTI. 9/03>> admit to [facility]-confusion.  Significant studies: 9/3>> MRI brain: No acute intracranial abnormality. 9/4>> x-ray left shoulder: No acute abnormality 9/4>> x-ray right shoulder: No acute abnormality.  Significant microbiology data: 8/31>> urine culture: E. coli. 9/03>> COVID/influenza/RSV PCR: Negative 9/03>> blood culture: No growth  Procedures: None  Consults: None  Subjective:  Patient in bed, appears comfortable, denies any headache, no fever, no chest pain or pressure, no shortness of breath , no abdominal pain. No new focal weakness.   Objective: Vitals: Blood pressure 112/67, pulse 83, temperature 98.2 F (36.8 C), temperature source Oral, resp. rate 16, height 5' 9 (1.753 m), weight 88.5 kg, SpO2 96%.   Exam:  Awake Alert, No new F.N deficits, Normal affect [place].AT,[name] Supple Neck, No JVD,   Symmetrical Chest wall movement, Good air movement bilaterally, CTAB RRR,No Gallops, Rubs or new Murmurs,  +ve B.Sounds, Abd Soft, No tenderness,   No Cyanosis, Clubbing or edema    Assessment/Plan: Acute toxic metabolic encephalopathy Suspect etiology more from polypharmacy (on narcotics/multiple psychotropics)-rather than UTI Overall improved-more awake/alert this morning-answering simple questions appropriately Complete Rocephin  x 7 days total Difficult situation-apparently has been on narcotics for years-recently switched  by pain management from oxycodone /Tylenol  to buprenorphine .  Complicated UTI Afebrile Encephalopathy improving Rocephin  x 7 days total Frequent bladder scans to ensure no retention  Chronic pain syndrome Avoid addition of other narcotics Continue buprenorphine  Add scheduled Tylenol , add as needed Toradol . Right wrist pain on 08/21/2024 resolved, stable x-ray and uric acid levels.  Permanent A-fib Telemetry monitoring Metoprolol /Eliquis   Chronic HFpEF Euvolemic As needed Lasix  based on exam.  HLD Statin  BPH Flomax  See above regarding frequent bladder scans.  Anxiety/depression On multiple medications-since mental status improving-continue Wellbutrin /Topamax /Requip  Seroquel  on hold-resume over the next Avril days.  Code status:   Code Status: Full Code   DVT Prophylaxis: apixaban  (ELIQUIS ) tablet 5 mg    Family Communication: Sister-Brenda-[phone] updated 9/5   Disposition Plan: Status is: Inpatient Remains inpatient appropriate because: Severity of illness   Planned Discharge Destination:Skilled nursing facility   Diet: Diet Order             Diet regular Room service appropriate? Yes; Fluid consistency: Thin  Diet effective now                    Data Review:   Patient Lines/Drains/Airways Status     Active Line/Drains/Airways     Name Placement date Placement time Site Days   Peripheral IV 08/19/24 20 G Distal;Posterior;Right Forearm 08/19/24  1511  Forearm  3             Inpatient Medications  Scheduled Meds:  acetaminophen   1,000 mg Oral Q8H   apixaban   5 mg Oral BID   aspirin  EC  81 mg Oral Daily   buPROPion   100 mg Oral BID   ferrous sulfate   325 mg Oral BID WC   lidocaine   1 patch Transdermal Daily   metoprolol  succinate  25 mg Oral Daily   pantoprazole   40 mg Oral Q1200   rOPINIRole   0.25 mg Oral BID   simvastatin   40 mg Oral QHS   tamsulosin   0.4 mg Oral QHS   topiramate   25 mg Oral BID   Continuous Infusions:   cefTRIAXone  (ROCEPHIN )  IV Stopped (08/21/24 2219)   PRN Meds:.bisacodyl , buprenorphine , ketorolac , melatonin, ondansetron  **OR** ondansetron  (ZOFRAN ) IV  DVT Prophylaxis   apixaban  (ELIQUIS ) tablet 5 mg    Recent Labs  Lab 08/18/24 0330 08/19/24 1456 08/19/24 1606 08/19/24 1703 08/20/24 0311 08/21/24 0531 08/22/24 0542  WBC 10.2 13.2*  --   --  11.2* 8.0 6.1  HGB 11.7* 12.5* 13.9 12.6* 11.9* 11.3* 10.9*  HCT 36.9* 39.2 41.0 37.0* 37.2* 34.1* 33.5*  PLT 238 257  --   --  220 255 255  MCV 91.6 91.4  --   --  92.1 88.3 90.3  MCH 29.0 29.1  --   --  29.5 29.3 29.4  MCHC 31.7 31.9  --   --  32.0 33.1 32.5  RDW 16.5* 16.6*  --   --  16.6* 16.1* 16.3*  LYMPHSABS  --  0.9  --   --   --  1.0  --   MONOABS  --  1.6*  --   --   --  0.9  --   EOSABS  --  0.0  --   --   --  0.1  --   BASOSABS  --  0.0  --   --   --  0.0  --     Recent Labs  Lab 08/16/24 1048 08/17/24 0320 08/18/24 0330 08/19/24 1530 08/19/24 1606 08/19/24 1703 08/19/24 1924 08/20/24 0311 08/21/24 0531 08/22/24 0542  NA 135 139 136  --  133* 134* 135 135 136 137  K 2.7* 2.9* 3.7  --  6.0* 4.0 3.8 4.2 4.0 4.0  CL 87* 93* 93*  --  98  --  96* 96* 95* 95*  CO2 33* 33* 34*  --   --   --  27 25 27 27   ANIONGAP 15 13 9   --   --   --  12 14 14 15   GLUCOSE 120* 102* 97  --  104*  --  94 91 96 89  BUN 26* 23 30*  --  60*  --  44* 40* 37* 45*  CREATININE 1.13 1.04 1.11  --  1.20  --  1.08 1.04 1.10 1.24  AST 24 19 25   --   --   --   --   --   --   --   ALT 18 16 17   --   --   --   --   --   --   --   ALKPHOS 100 82 74  --   --   --   --   --   --   --   BILITOT 1.0 0.8 0.6  --   --   --   --   --   --   --   ALBUMIN 3.6 2.9* 2.6*  --   --   --   --   --   --   --   LATICACIDVEN  --   --   --   --  1.2  --   --   --   --   --  TSH  --   --   --   --   --   --   --  2.114  --   --   BNP 346.2*  --   --  197.2*  --   --   --   --   --   --   MG 2.1  --  2.2  --   --   --   --   --   --   --   CALCIUM  9.6 9.1 9.0   --   --   --  8.8* 8.9 9.0 8.9      Recent Labs  Lab 08/16/24 1048 08/17/24 0320 08/18/24 0330 08/19/24 1530 08/19/24 1606 08/19/24 1924 08/20/24 0311 08/21/24 0531 08/22/24 0542  LATICACIDVEN  --   --   --   --  1.2  --   --   --   --   TSH  --   --   --   --   --   --  2.114  --   --   BNP 346.2*  --   --  197.2*  --   --   --   --   --   MG 2.1  --  2.2  --   --   --   --   --   --   CALCIUM  9.6   < > 9.0  --   --  8.8* 8.9 9.0 8.9   < > = values in this interval not displayed.    --------------------------------------------------------------------------------------------------------------- Lab Results  Component Value Date   CHOL 112 11/11/2019   HDL 46 11/11/2019   LDLCALC 57 11/11/2019   TRIG 45 11/11/2019   CHOLHDL 2.4 11/11/2019    Lab Results  Component Value Date   HGBA1C 5.3 01/30/2024   Recent Labs    08/20/24 0311  TSH 2.114   No results for input(s): VITAMINB12, FOLATE, FERRITIN, TIBC, IRON, RETICCTPCT in the last 72 hours. ------------------------------------------------------------------------------------------------------------------ Cardiac Enzymes No results for input(s): CKMB, TROPONINI, MYOGLOBIN in the last 168 hours.  Invalid input(s): CK  Radiology Reports  DG Wrist 2 Views Right Result Date: 08/21/2024 CLINICAL DATA:  855384 Pain 855384 EXAM: RIGHT WRIST - 2 VIEW COMPARISON:  November 10, 2019 FINDINGS: No acute fracture or dislocation. There is no evidence of arthropathy or other focal bone abnormality. Soft tissues are unremarkable. IMPRESSION: No acute fracture or dislocation. Electronically Signed   By: Rogelia Myers M.D.   On: 08/21/2024 11:42   DG Shoulder Left Result Date: 08/20/2024 CLINICAL DATA:  Pain. EXAM: LEFT SHOULDER - 2+ VIEW COMPARISON:  Left shoulder radiographs dated 06/22/2022. FINDINGS: Diffuse osseous demineralization. No acute fracture or dislocation. Severe glenohumeral joint osteoarthritis  with advanced joint space narrowing and marginal osteophytosis, progressed since the prior exam. Findings suggestive of chronic rotator cuff pathology. Mild acromioclavicular joint osteoarthritis. Partially visualized postsurgical changes of the cervical and thoracic spine. IMPRESSION: 1. No acute osseous abnormality. 2. Severe glenohumeral joint osteoarthritis with findings suggestive of chronic rotator cuff pathology. 3. Mild acromioclavicular joint osteoarthritis. Electronically Signed   By: Harrietta Sherry M.D.   On: 08/20/2024 17:58   DG Shoulder Right Result Date: 08/20/2024 CLINICAL DATA:  Pain. EXAM: RIGHT SHOULDER - 2+ VIEW COMPARISON:  None Available. FINDINGS: Diffuse osseous demineralization. No acute fracture or dislocation. Severe glenohumeral joint osteoarthritis with advanced joint space narrowing and marginal osteophytosis. Suture anchor in the right humeral head. Findings suggestive of chronic rotator cuff pathology. Moderate  degenerative arthropathy of the acromioclavicular joint. Partially visualized postoperative changes of the cervical spine. IMPRESSION: 1. No acute osseous abnormality. 2. Severe glenohumeral joint osteoarthritis with findings suggestive of chronic rotator cuff pathology. 3. Moderate acromioclavicular joint osteoarthritis. Electronically Signed   By: Harrietta Sherry M.D.   On: 08/20/2024 17:56      Signature  -   Lavada Stank M.D on 08/22/2024 at 10:14 AM   -  To page go to [email or web address]

## 2024-08-23 MED ORDER — QUETIAPINE FUMARATE 25 MG PO TABS
25.0000 mg | ORAL_TABLET | Freq: Two times a day (BID) | ORAL | Status: DC
  Administered 2024-08-23: 25 mg via ORAL
  Filled 2024-08-23 (×2): qty 1

## 2024-08-23 MED ORDER — HALOPERIDOL LACTATE 5 MG/ML IJ SOLN: 2.0000 mg | Freq: Four times a day (QID) | INTRAMUSCULAR | Status: DC | PRN

## 2024-08-23 NOTE — Progress Notes (Signed)
 PROGRESS NOTE        PATIENT DETAILS Name: [name] Age: 83 y.o. Sex: male Date of Birth: January 12, 1941 Admit Date: 08/19/2024 Admitting Physician Brett Bernard, MD ERE:Mpryuzm, Brett LITTIE, MD  Brief Summary: Patient is a 83 y.o.  male with history of chronic pain syndrome on narcotics, A-fib, CVA, HTN-who was just discharged from the hospital on 9/2 after being treated for UTI/evaluation of chest pain-presented with confusion.  Significant events: 8/31-9/2>> hospitalization for evaluation of chest pain/UTI. 9/03>> admit to [facility]-confusion.  Significant studies: 9/3>> MRI brain: No acute intracranial abnormality. 9/4>> x-ray left shoulder: No acute abnormality 9/4>> x-ray right shoulder: No acute abnormality.  Significant microbiology data: 8/31>> urine culture: E. coli. 9/03>> COVID/influenza/RSV PCR: Negative 9/03>> blood culture: No growth  Procedures: None  Consults: None  Subjective:  Patient in bed, appears comfortable, denies any headache, no fever, no chest pain or pressure, no shortness of breath , no abdominal pain. No new focal weakness.   Objective: Vitals: Blood pressure 94/66, pulse 70, temperature 98.2 F (36.8 C), temperature source Oral, resp. rate 19, height 5' 9 (1.753 m), weight 88.5 kg, SpO2 91%.   Exam:  Awake but confused, No new F.N deficits, has scrapped his R ear lobe - bleeding [name],[name] Supple Neck, No JVD,   Symmetrical Chest wall movement, Good air movement bilaterally, CTAB RRR,No Gallops, Rubs or new Murmurs,  +ve B.Sounds, Abd Soft, No tenderness,   No Cyanosis, Clubbing or edema    Assessment/Plan:  Acute toxic metabolic encephalopathy Suspect etiology more from polypharmacy (on narcotics/multiple psychotropics)-rather than UTI Overall improved-more awake/alert this morning-answering simple questions appropriately Complete Rocephin  x 7 days total Difficult situation-apparently has been on  narcotics for years-recently switched by pain management from oxycodone /Tylenol  to buprenorphine .  Complicated UTI Afebrile Encephalopathy improving Rocephin  x 7 days total Frequent bladder scans to ensure no retention  Chronic pain syndrome Avoid addition of other narcotics Continue buprenorphine  Add scheduled Tylenol , add as needed Toradol . Right wrist pain on 08/21/2024 resolved, stable x-ray and uric acid levels.  Permanent A-fib Telemetry monitoring Metoprolol /Eliquis   Chronic HFpEF Euvolemic As needed Lasix  based on exam.  HLD Statin  BPH Flomax  See above regarding frequent bladder scans.  Anxiety/depression On multiple medications-since mental status improving-continue Wellbutrin /Topamax /Requip  Start Seroquel    Code status:   Code Status: Full Code   DVT Prophylaxis: apixaban  (ELIQUIS ) tablet 5 mg    Family Communication: Sister-[name] updated 08/23/2024, per sister patient has had multiple falls at home and she is unable to lift him up from the floor, every time he falls she has to call the fire department to lift him up, she understands that he is at very high risk for hurting himself at home along with her.  She is willing to look into SNF placement, TOC informed on 08/23/2024.   Disposition Plan: Status is: Inpatient Remains inpatient appropriate because: Severity of illness   Planned Discharge Destination:Skilled nursing facility   Diet: Diet Order             Diet regular Room service appropriate? Yes; Fluid consistency: Thin  Diet effective now                    Data Review:   Patient Lines/Drains/Airways Status     Active Line/Drains/Airways     Name Placement date Placement time Site Days  Peripheral IV 08/19/24 20 G Distal;Posterior;Right Forearm 08/19/24  1511  Forearm  3             Inpatient Medications  Scheduled Meds:  acetaminophen   1,000 mg Oral Q8H   apixaban   5 mg Oral BID   aspirin  EC  81 mg Oral  Daily   buPROPion   100 mg Oral BID   ferrous sulfate   325 mg Oral BID WC   lidocaine   1 patch Transdermal Daily   metoprolol  succinate  25 mg Oral Daily   pantoprazole   40 mg Oral Q1200   QUEtiapine   25 mg Oral BID   rOPINIRole   0.25 mg Oral BID   simvastatin   40 mg Oral QHS   tamsulosin   0.4 mg Oral QHS   topiramate   25 mg Oral BID   Continuous Infusions:   PRN Meds:.bisacodyl , buprenorphine , haloperidol  lactate, ketorolac , melatonin, ondansetron  **OR** ondansetron  (ZOFRAN ) IV  DVT Prophylaxis   apixaban  (ELIQUIS ) tablet 5 mg    Recent Labs  Lab 08/18/24 0330 08/19/24 1456 08/19/24 1606 08/19/24 1703 08/20/24 0311 08/21/24 0531 08/22/24 0542  WBC 10.2 13.2*  --   --  11.2* 8.0 6.1  HGB 11.7* 12.5* 13.9 12.6* 11.9* 11.3* 10.9*  HCT 36.9* 39.2 41.0 37.0* 37.2* 34.1* 33.5*  PLT 238 257  --   --  220 255 255  MCV 91.6 91.4  --   --  92.1 88.3 90.3  MCH 29.0 29.1  --   --  29.5 29.3 29.4  MCHC 31.7 31.9  --   --  32.0 33.1 32.5  RDW 16.5* 16.6*  --   --  16.6* 16.1* 16.3*  LYMPHSABS  --  0.9  --   --   --  1.0  --   MONOABS  --  1.6*  --   --   --  0.9  --   EOSABS  --  0.0  --   --   --  0.1  --   BASOSABS  --  0.0  --   --   --  0.0  --     Recent Labs  Lab 08/16/24 1048 08/17/24 0320 08/18/24 0330 08/19/24 1530 08/19/24 1606 08/19/24 1703 08/19/24 1924 08/20/24 0311 08/21/24 0531 08/22/24 0542  NA 135 139 136  --  133* 134* 135 135 136 137  K 2.7* 2.9* 3.7  --  6.0* 4.0 3.8 4.2 4.0 4.0  CL 87* 93* 93*  --  98  --  96* 96* 95* 95*  CO2 33* 33* 34*  --   --   --  27 25 27 27   ANIONGAP 15 13 9   --   --   --  12 14 14 15   GLUCOSE 120* 102* 97  --  104*  --  94 91 96 89  BUN 26* 23 30*  --  60*  --  44* 40* 37* 45*  CREATININE 1.13 1.04 1.11  --  1.20  --  1.08 1.04 1.10 1.24  AST 24 19 25   --   --   --   --   --   --   --   ALT 18 16 17   --   --   --   --   --   --   --   ALKPHOS 100 82 74  --   --   --   --   --   --   --   BILITOT 1.0 0.8 0.6  --   --    --   --   --   --   --  ALBUMIN 3.6 2.9* 2.6*  --   --   --   --   --   --   --   LATICACIDVEN  --   --   --   --  1.2  --   --   --   --   --   TSH  --   --   --   --   --   --   --  2.114  --   --   BNP 346.2*  --   --  197.2*  --   --   --   --   --   --   MG 2.1  --  2.2  --   --   --   --   --   --   --   CALCIUM  9.6 9.1 9.0  --   --   --  8.8* 8.9 9.0 8.9      Recent Labs  Lab 08/16/24 1048 08/17/24 0320 08/18/24 0330 08/19/24 1530 08/19/24 1606 08/19/24 1924 08/20/24 0311 08/21/24 0531 08/22/24 0542  LATICACIDVEN  --   --   --   --  1.2  --   --   --   --   TSH  --   --   --   --   --   --  2.114  --   --   BNP 346.2*  --   --  197.2*  --   --   --   --   --   MG 2.1  --  2.2  --   --   --   --   --   --   CALCIUM  9.6   < > 9.0  --   --  8.8* 8.9 9.0 8.9   < > = values in this interval not displayed.    --------------------------------------------------------------------------------------------------------------- Lab Results  Component Value Date   CHOL 112 11/11/2019   HDL 46 11/11/2019   LDLCALC 57 11/11/2019   TRIG 45 11/11/2019   CHOLHDL 2.4 11/11/2019    Lab Results  Component Value Date   HGBA1C 5.3 01/30/2024   No results for input(s): TSH, T4TOTAL, FREET4, T3FREE, THYROIDAB in the last 72 hours.  No results for input(s): VITAMINB12, FOLATE, FERRITIN, TIBC, IRON, RETICCTPCT in the last 72 hours. ------------------------------------------------------------------------------------------------------------------ Cardiac Enzymes No results for input(s): CKMB, TROPONINI, MYOGLOBIN in the last 168 hours.  Invalid input(s): CK  Radiology Reports  DG Wrist 2 Views Right Result Date: 08/21/2024 CLINICAL DATA:  855384 Pain 855384 EXAM: RIGHT WRIST - 2 VIEW COMPARISON:  November 10, 2019 FINDINGS: No acute fracture or dislocation. There is no evidence of arthropathy or other focal bone abnormality. Soft tissues are unremarkable.  IMPRESSION: No acute fracture or dislocation. Electronically Signed   By: Rogelia Myers M.D.   On: 08/21/2024 11:42      Signature  -   Lavada Stank M.D on 08/23/2024 at 9:11 AM   -  To page go to [email or web address]

## 2024-08-23 NOTE — Plan of Care (Signed)
  Problem: Education: Goal: Knowledge of General Education information will improve Description: Including pain rating scale, medication(s)/side effects and non-pharmacologic comfort measures 08/23/2024 2052 by Elnor Zachary CROME, RN Outcome: Progressing 08/23/2024 2051 by Elnor Zachary CROME, RN Outcome: Progressing   Problem: Health Behavior/Discharge Planning: Goal: Ability to manage health-related needs will improve 08/23/2024 2052 by Elnor Zachary CROME, RN Outcome: Progressing 08/23/2024 2051 by Elnor Zachary CROME, RN Outcome: Progressing   Problem: Clinical Measurements: Goal: Ability to maintain clinical measurements within normal limits will improve 08/23/2024 2052 by Elnor Zachary CROME, RN Outcome: Progressing 08/23/2024 2051 by Elnor Zachary CROME, RN Outcome: Progressing Goal: Will remain free from infection 08/23/2024 2052 by Elnor Zachary CROME, RN Outcome: Progressing 08/23/2024 2051 by Elnor Zachary CROME, RN Outcome: Progressing Goal: Diagnostic test results will improve 08/23/2024 2052 by Elnor Zachary CROME, RN Outcome: Progressing 08/23/2024 2051 by Elnor Zachary CROME, RN Outcome: Progressing Goal: Respiratory complications will improve 08/23/2024 2052 by Elnor Zachary CROME, RN Outcome: Progressing 08/23/2024 2051 by Elnor Zachary CROME, RN Outcome: Progressing Goal: Cardiovascular complication will be avoided 08/23/2024 2052 by Elnor Zachary CROME, RN Outcome: Progressing 08/23/2024 2051 by Elnor Zachary CROME, RN Outcome: Progressing   Problem: Activity: Goal: Risk for activity intolerance will decrease 08/23/2024 2052 by Elnor Zachary CROME, RN Outcome: Progressing 08/23/2024 2051 by Elnor Zachary CROME, RN Outcome: Progressing   Problem: Nutrition: Goal: Adequate nutrition will be maintained 08/23/2024 2052 by Elnor Zachary CROME, RN Outcome: Progressing 08/23/2024 2051 by Elnor Zachary CROME, RN Outcome: Progressing   Problem: Coping: Goal: Level of anxiety will decrease 08/23/2024 2052 by Elnor Zachary CROME, RN Outcome: Progressing 08/23/2024 2051 by Elnor Zachary CROME, RN Outcome: Progressing   Problem: Elimination: Goal: Will not experience complications related to bowel motility 08/23/2024 2052 by Elnor Zachary CROME, RN Outcome: Progressing 08/23/2024 2051 by Elnor Zachary CROME, RN Outcome: Progressing Goal: Will not experience complications related to urinary retention 08/23/2024 2052 by Elnor Zachary CROME, RN Outcome: Progressing 08/23/2024 2051 by Elnor Zachary CROME, RN Outcome: Progressing   Problem: Pain Managment: Goal: General experience of comfort will improve and/or be controlled 08/23/2024 2052 by Elnor Zachary CROME, RN Outcome: Progressing 08/23/2024 2051 by Elnor Zachary CROME, RN Outcome: Progressing   Problem: Safety: Goal: Ability to remain free from injury will improve 08/23/2024 2052 by Elnor Zachary CROME, RN Outcome: Progressing 08/23/2024 2051 by Elnor Zachary CROME, RN Outcome: Progressing   Problem: Skin Integrity: Goal: Risk for impaired skin integrity will decrease 08/23/2024 2052 by Elnor Zachary CROME, RN Outcome: Progressing 08/23/2024 2051 by Elnor Zachary CROME, RN Outcome: Progressing

## 2024-08-23 NOTE — Plan of Care (Signed)

## 2024-08-24 ENCOUNTER — Other Ambulatory Visit (HOSPITAL_COMMUNITY): Payer: Self-pay

## 2024-08-24 LAB — CULTURE, BLOOD (ROUTINE X 2)
Culture: NO GROWTH
Culture: NO GROWTH

## 2024-08-24 MED ORDER — METOPROLOL SUCCINATE ER 25 MG PO TB24
25.0000 mg | ORAL_TABLET | Freq: Every day | ORAL | 0 refills | Status: AC
  Filled 2024-08-24: qty 30, 30d supply, fill #0

## 2024-08-24 MED ORDER — QUETIAPINE FUMARATE 50 MG PO TABS
50.0000 mg | ORAL_TABLET | Freq: Two times a day (BID) | ORAL | Status: DC
  Administered 2024-08-24: 50 mg via ORAL
  Filled 2024-08-24: qty 1

## 2024-08-24 NOTE — Progress Notes (Signed)
 Discharge Nurse Summary: DC order noted per MD. DC RN at bedside with patient. Patient agreeable with discharge plan, states sister will arrive soon for pickup.   AVS printed/reviewed. PIV removed, skin intact. No DME needs. No home meds. No TOC meds as pharmacy confirmed the patient has ordered medication at home from previous admission. CP/Edu resolved. Telemonitor not present on assessment. All belongings accounted for. Dressing to R ear CDI, see LDAs. Patient wheeled downstairs for discharge by private auto.   Sister provided thorough instruction on options discussed with SW/CM team if she feels she is not able to care for the patient. APS# provided and saved to her telephone in the case she needs to call to report any concerns regarding the patient, medical management, patient safety or her safety.   Rosario EMERSON Lund, RN

## 2024-08-24 NOTE — Discharge Instructions (Signed)
 Follow with Primary MD [name], MD in 7 days   Get CBC, CMP, Magnesium , 2 view Chest X ray -  checked next visit with your primary MD   Activity: As tolerated with Full fall precautions use walker/cane & assistance as needed  Disposition Home   Special Instructions: If you have smoked or chewed Tobacco  in the last 2 yrs please stop smoking, stop any regular Alcohol  and or any Recreational drug use.  On your next visit with your primary care physician please Get Medicines reviewed and adjusted.  Please request your Prim.MD to go over all Hospital Tests and Procedure/Radiological results at the follow up, please get all Hospital records sent to your Prim MD by signing hospital release before you go home.  If you experience worsening of your admission symptoms, develop shortness of breath, life threatening emergency, suicidal or homicidal thoughts you must seek medical attention immediately by calling 911 or calling your MD immediately  if symptoms less severe.  You Must read complete instructions/literature along with all the possible adverse reactions/side effects for all the Medicines you take and that have been prescribed to you. Take any new Medicines after you have completely understood and accpet all the possible adverse reactions/side effects.   Do not drive when taking Pain medications.  Do not take more than prescribed Pain, Sleep and Anxiety Medications  Wear Seat belts while driving.

## 2024-08-24 NOTE — Discharge Summary (Signed)
 [name] FMW:[id number] DOB: 12-Jun-1941 DOA: 08/19/2024  PCP: [name], MD  Admit date: 08/19/2024  Discharge date: 08/24/2024  Admitted From: Home   Disposition:  Home - refused SNF   Recommendations for Outpatient Follow-up:   Follow up with PCP in 1-2 weeks  PCP Please obtain BMP/CBC, 2 view CXR in 1week,  (see Discharge instructions)   PCP Please follow up on the following pending results:    Home Health: PT, RN, SW, Aide if qualifies   Equipment/Devices: as below  Consultations: None  Discharge Condition: Fair   CODE STATUS: Full    Diet Recommendation: Heart Healthy     Chief Complaint  Patient presents with   Altered Mental Status     Brief history of present illness from the day of admission and additional interim summary    83 y.o.  male with history of chronic pain syndrome on narcotics, A-fib, CVA, HTN-who was just discharged from the hospital on 9/2 after being treated for UTI/evaluation of chest pain-presented with confusion.                                                                   Hospital Course   Acute toxic metabolic encephalopathy Suspect etiology more from polypharmacy (on narcotics/multiple psychotropics)-rather than UTI Overall improved-more awake/alert this morning-answering simple questions appropriately, refused SNF and adamant to be discharged home, discussed the plan with sister as well, she clearly states that she cannot take care of him at home but feels that she cannot stand up against his wishes. Completed Rocephin  regimen Difficult situation-apparently has been on narcotics for years-recently switched by pain management from oxycodone /Tylenol  to buprenorphine .   Complicated UTI Afebrile Encephalopathy improving Initial treatment with Rocephin  Able  postvoid bladder scans   Chronic pain syndrome Stable with supportive care continue home narcotic regimen Right wrist pain on 08/21/2024 resolved, stable x-ray and uric acid levels.   Permanent A-fib Remained stable on telemetry.  Continue Metoprolol /Eliquis    Chronic HFpEF Continue home as needed Lasix  regimen   HLD Statin   BPH Flomax  continued, stable postvoid bladder scans.   Anxiety/depression Home regimen continued.  Follow-up with PCP in a week.  Discharge diagnosis     Principal Problem:   Encephalopathy acute Active Problems:   Acute on chronic respiratory failure with hypoxia (HCC)   CAD (coronary artery disease)   S/P TAVR (transcatheter aortic valve replacement)   AF (paroxysmal atrial fibrillation) (HCC)    Discharge instructions    Discharge Instructions     Diet - low sodium heart healthy   Complete by: As directed    Discharge instructions   Complete by: As directed    Follow with Primary MD [name], MD in 7 days   Get CBC, CMP, Magnesium , 2 view  Chest X ray -  checked next visit with your primary MD   Activity: As tolerated with Full fall precautions use walker/cane & assistance as needed  Disposition Home   Special Instructions: If you have smoked or chewed Tobacco  in the last 2 yrs please stop smoking, stop any regular Alcohol  and or any Recreational drug use.  On your next visit with your primary care physician please Get Medicines reviewed and adjusted.  Please request your Prim.MD to go over all Hospital Tests and Procedure/Radiological results at the follow up, please get all Hospital records sent to your Prim MD by signing hospital release before you go home.  If you experience worsening of your admission symptoms, develop shortness of breath, life threatening emergency, suicidal or homicidal thoughts you must seek medical attention immediately by calling 911 or calling your MD immediately  if symptoms less severe.  You Must  read complete instructions/literature along with all the possible adverse reactions/side effects for all the Medicines you take and that have been prescribed to you. Take any new Medicines after you have completely understood and accpet all the possible adverse reactions/side effects.   Do not drive when taking Pain medications.  Do not take more than prescribed Pain, Sleep and Anxiety Medications  Wear Seat belts while driving.   Discharge wound care:   Complete by: As directed    Keep the dressing on your right earlobe for the next 3 days.   Increase activity slowly   Complete by: As directed        Discharge Medications   Allergies as of 08/24/2024       Reactions   Ativan  [lorazepam ] Other (See Comments)   Delirium   Lyrica [pregabalin] Swelling, Other (See Comments)   Triggered PTSD, Nightmares, Aggression   Zanaflex [tizanidine Hcl] Swelling, Anxiety   Cymbalta [duloxetine Hcl] Other (See Comments)   Increased depression, Irritability         Medication List     STOP taking these medications    aspirin  EC 81 MG tablet   cefadroxil  500 MG capsule Commonly known as: DURICEF       TAKE these medications    apixaban  5 MG Tabs tablet Commonly known as: ELIQUIS  Take 5 mg by mouth 2 (two) times daily.   buprenorphine  2 MG Subl SL tablet Commonly known as: SUBUTEX  Place 2 mg under the tongue 4 (four) times daily as needed (pain).   buPROPion  100 MG tablet Commonly known as: WELLBUTRIN  Take 1 tablet (100 mg total) by mouth 2 (two) times daily.   ferrous sulfate  325 (65 FE) MG tablet Take 325 mg by mouth 2 (two) times daily with a meal.   furosemide  40 MG tablet Commonly known as: LASIX  Take 1 tablet (40 mg total) by mouth 2 (two) times daily for 5 days. What changed: when to take this   lidocaine  5 % Commonly known as: Lidoderm  Place 1 patch onto the skin daily as needed. Remove & Discard patch within 12 hours or as directed by MD What changed:  when to  take this additional instructions   metoprolol  succinate 25 MG 24 hr tablet Commonly known as: TOPROL -XL Take 1 tablet (25 mg total) by mouth daily.   potassium chloride  SA 20 MEQ tablet Commonly known as: KLOR-CON  M Take 1 tablet (20 mEq total) by mouth daily.   QUEtiapine  25 MG tablet Commonly known as: SEROQUEL  Take 25 mg by mouth at bedtime.   rOPINIRole  0.25 MG tablet Commonly  known as: REQUIP  Take 0.25 mg by mouth 2 (two) times daily.   silver sulfADIAZINE 1 % cream Commonly known as: SILVADENE Apply 1 Application topically at bedtime.   simvastatin  40 MG tablet Commonly known as: ZOCOR  Take 1 tablet (40 mg total) by mouth at bedtime.   sucralfate  1 g tablet Commonly known as: CARAFATE  Take 1 g by mouth 2 (two) times daily.   tamsulosin  0.4 MG Caps capsule Commonly known as: FLOMAX  Take 0.4 mg by mouth in the morning and at bedtime.   topiramate  25 MG capsule Commonly known as: TOPAMAX  Take 25 mg by mouth 2 (two) times daily.               Durable Medical Equipment  (From admission, onward)           Start     Ordered   08/24/24 0732  For home use only DME Walker rolling  Once       Comments: 5 wheel  Question Answer Comment  Walker: With 5 Inch Wheels   Patient needs a walker to treat with the following condition Weakness      08/24/24 0731              Discharge Care Instructions  (From admission, onward)           Start     Ordered   08/24/24 0000  Discharge wound care:       Comments: Keep the dressing on your right earlobe for the next 3 days.   08/24/24 0732             Follow-up Information     [name], MD. Schedule an appointment as soon as possible for a visit in 1 week(s).   Specialty: Family Medicine Contact information: [address] [place] [address] [place] [address] [phone]                 Major procedures and Radiology Reports - PLEASE review detailed and final reports  thoroughly  -      DG Wrist 2 Views Right Result Date: 08/21/2024 CLINICAL DATA:  855384 Pain [id number] EXAM: RIGHT WRIST - 2 VIEW COMPARISON:  November 10, 2019 FINDINGS: No acute fracture or dislocation. There is no evidence of arthropathy or other focal bone abnormality. Soft tissues are unremarkable. IMPRESSION: No acute fracture or dislocation. Electronically Signed   By: Rogelia Myers M.D.   On: 08/21/2024 11:42   DG Shoulder Left Result Date: 08/20/2024 CLINICAL DATA:  Pain. EXAM: LEFT SHOULDER - 2+ VIEW COMPARISON:  Left shoulder radiographs dated 06/22/2022. FINDINGS: Diffuse osseous demineralization. No acute fracture or dislocation. Severe glenohumeral joint osteoarthritis with advanced joint space narrowing and marginal osteophytosis, progressed since the prior exam. Findings suggestive of chronic rotator cuff pathology. Mild acromioclavicular joint osteoarthritis. Partially visualized postsurgical changes of the cervical and thoracic spine. IMPRESSION: 1. No acute osseous abnormality. 2. Severe glenohumeral joint osteoarthritis with findings suggestive of chronic rotator cuff pathology. 3. Mild acromioclavicular joint osteoarthritis. Electronically Signed   By: Harrietta Sherry M.D.   On: 08/20/2024 17:58   DG Shoulder Right Result Date: 08/20/2024 CLINICAL DATA:  Pain. EXAM: RIGHT SHOULDER - 2+ VIEW COMPARISON:  None Available. FINDINGS: Diffuse osseous demineralization. No acute fracture or dislocation. Severe glenohumeral joint osteoarthritis with advanced joint space narrowing and marginal osteophytosis. Suture anchor in the right humeral head. Findings suggestive of chronic rotator cuff pathology. Moderate degenerative arthropathy of the acromioclavicular joint. Partially visualized postoperative changes of  the cervical spine. IMPRESSION: 1. No acute osseous abnormality. 2. Severe glenohumeral joint osteoarthritis with findings suggestive of chronic rotator cuff pathology. 3. Moderate  acromioclavicular joint osteoarthritis. Electronically Signed   By: Harrietta Sherry M.D.   On: 08/20/2024 17:56   MR BRAIN WO CONTRAST Result Date: 08/19/2024 EXAM: MRI BRAIN WITHOUT CONTRAST 08/19/2024 93:76:75 PM TECHNIQUE: Multiplanar multisequence MRI of the head/brain was performed without the administration of intravenous contrast. COMPARISON: 05/27/2024 CLINICAL HISTORY: CTH for confussion demonstrated possible subdural. mri was recommend by rad. FINDINGS: BRAIN AND VENTRICLES: No acute infarct. No intracranial hemorrhage. No mass. No midline shift. No hydrocephalus. The sella is unremarkable. Normal flow voids. Generalized volume loss most apparent in the frontal lobes. Multifocal hyperintense T2-weighted signal within the cerebral white matter, most commonly due to chronic small vessel disease. ORBITS: No acute abnormality. SINUSES AND MASTOIDS: No acute abnormality. BONES AND SOFT TISSUES: Normal marrow signal. No acute soft tissue abnormality. IMPRESSION: 1. No acute intracranial abnormality. 2. Generalized volume loss most apparent in the frontal lobes. 3. Multifocal hyperintense T2-weighted signal within the cerebral white matter, most commonly due to chronic small vessel disease. Electronically signed by: Franky Stanford MD 08/19/2024 07:58 PM EDT RP Workstation: [id number]   CT Head Wo Contrast Result Date: 08/19/2024 CLINICAL DATA:  Provided history: Altered mental status. EXAM: CT HEAD WITHOUT CONTRAST TECHNIQUE: Contiguous axial images were obtained from the base of the skull through the vertex without intravenous contrast. RADIATION DOSE REDUCTION: This exam was performed according to the departmental dose-optimization program which includes automated exposure control, adjustment of the mA and/or kV according to patient size and/or use of iterative reconstruction technique. COMPARISON:  Brain MRI 05/27/2024. Non-contrast head CT and CT angiogram head/neck 08/19/2024. FINDINGS: Streak/beam  hardening artifact arising from a craniocervical fusion construct partially obscures the posterior fossa. Within this limitation, findings are as follows. Brain: Generalized cerebral atrophy. A small intermediate-to-low density subdural collection is questioned along the anterolateral right frontal lobe, measuring up to 5 mm in thickness (for instance as seen on series 3, image 10) (series 4, images 51 and 52). Patchy and ill-defined hypoattenuation within the cerebral white matter, nonspecific but compatible with mild chronic small vessel ischemic disease. No demarcated cortical infarct. No evidence of an intracranial mass. No midline shift. Vascular: No hyperdense vessel.  Atherosclerotic calcifications. Skull: No calvarial fracture or aggressive osseous lesion. Partially imaged craniocervical fusion construct. Sinuses/Orbits: No mass or acute finding within the imaged orbits. No significant paranasal sinus disease at the imaged levels. Impression #2 called by telephone at the time of interpretation on 08/19/2024 at 4:38 pm to provider Dr. Elnor, who verbally acknowledged these results. IMPRESSION: 1. Streak/beam hardening artifact arising from a craniocervical fusion construct partially obscures the posterior fossa. Within this limitation, findings are as follows. 2. A small intermediate-to-low density subdural collection is questioned along the anterolateral right frontal lobe (versus artifact). Consider a brain MRI for further evaluation. No midline shift or significant mass effect. 3. Parenchymal atrophy and chronic small vessel ischemic disease. Electronically Signed   By: Rockey Childs D.O.   On: 08/19/2024 16:42   DG Chest 1 View Result Date: 08/19/2024 CLINICAL DATA:  Altered mental status.  Shortness of breath.  Chest EXAM: CHEST  1 VIEW COMPARISON:  Radiograph dated 08/16/2024. FINDINGS: No focal consolidation, pleural effusion, or pneumothorax. Stable cardiomegaly. Degenerative changes of the spine. No  acute osseous pathology. IMPRESSION: 1. No active disease. 2. Cardiomegaly. Electronically Signed   By: Vanetta Chou M.D.   On:  08/19/2024 15:33   DG Chest Port 1 View Result Date: 08/16/2024 CLINICAL DATA:  83 year old male with shortness of breath, orthopnea. EXAM: PORTABLE CHEST 1 VIEW COMPARISON:  Portable chest 06/25/2024 and earlier. FINDINGS: Portable AP semi upright view at 1040 hours. Chronic anterior and posterior spinal fusion hardware, lower thoracic posterior spinal fusion hardware, TAVR redemonstrated. Stable low lung volumes. Stable cardiac size and mediastinal contours. Allowing for portable technique the lungs are clear. No pneumothorax or pleural effusion. Chronic left lower rib fractures. Stable visualized osseous structures. Negative visible bowel gas. IMPRESSION: No acute cardiopulmonary abnormality. Electronically Signed   By: VEAR Hurst M.D.   On: 08/16/2024 10:53    Micro Results    Recent Results (from the past 240 hours)  Resp panel by RT-PCR (RSV, Flu A&B, Covid) Anterior Nasal Swab     Status: None   Collection Time: 08/16/24 10:49 AM   Specimen: Anterior Nasal Swab  Result Value Ref Range Status   SARS Coronavirus 2 by RT PCR NEGATIVE NEGATIVE Final   Influenza A by PCR NEGATIVE NEGATIVE Final   Influenza B by PCR NEGATIVE NEGATIVE Final    Comment: (NOTE) The Xpert Xpress SARS-CoV-2/FLU/RSV plus assay is intended as an aid in the diagnosis of influenza from Nasopharyngeal swab specimens and should not be used as a sole basis for treatment. Nasal washings and aspirates are unacceptable for Xpert Xpress SARS-CoV-2/FLU/RSV testing.  Fact Sheet for Patients: BloggerCourse.com  Fact Sheet for Healthcare Providers: SeriousBroker.it  This test is not yet approved or cleared by the [place]  FDA and has been authorized for detection and/or diagnosis of SARS-CoV-2 by FDA under an Emergency Use Authorization  (EUA). This EUA will remain in effect (meaning this test can be used) for the duration of the COVID-19 declaration under Section 564(b)(1) of the Act, 21 U.S.C. section 360bbb-3(b)(1), unless the authorization is terminated or revoked.     Resp Syncytial Virus by PCR NEGATIVE NEGATIVE Final    Comment: (NOTE) Fact Sheet for Patients: BloggerCourse.com  Fact Sheet for Healthcare Providers: SeriousBroker.it  This test is not yet approved or cleared by the [place]  FDA and has been authorized for detection and/or diagnosis of SARS-CoV-2 by FDA under an Emergency Use Authorization (EUA). This EUA will remain in effect (meaning this test can be used) for the duration of the COVID-19 declaration under Section 564(b)(1) of the Act, 21 U.S.C. section 360bbb-3(b)(1), unless the authorization is terminated or revoked.  Performed at [facility], [address], [place], [place] [address]   Urine Culture     Status: Abnormal   Collection Time: 08/16/24  3:29 PM   Specimen: Urine, Clean Catch  Result Value Ref Range Status   Specimen Description URINE, CLEAN CATCH  Final   Special Requests   Final    NONE Performed at [facility], [address], [place], [place] [address]    Culture >=100,000 COLONIES/mL ESCHERICHIA COLI (A)  Final   Report Status 08/18/2024 FINAL  Final   Organism ID, Bacteria ESCHERICHIA COLI (A)  Final      Susceptibility   Escherichia coli - MIC*    AMPICILLIN  >=32 RESISTANT Resistant     CEFAZOLIN (URINE) Value in next row Sensitive      4 SENSITIVEThis is a modified FDA-approved test that has been validated and its performance characteristics determined by the reporting laboratory.  This laboratory is certified under the Clinical Laboratory Improvement Amendments CLIA as qualified to perform high complexity clinical  laboratory testing.    CEFEPIME Value in next row Sensitive      4 SENSITIVEThis  is a modified FDA-approved test that has been validated and its performance characteristics determined by the reporting laboratory.  This laboratory is certified under the Clinical Laboratory Improvement Amendments CLIA as qualified to perform high complexity clinical laboratory testing.    ERTAPENEM Value in next row Sensitive      4 SENSITIVEThis is a modified FDA-approved test that has been validated and its performance characteristics determined by the reporting laboratory.  This laboratory is certified under the Clinical Laboratory Improvement Amendments CLIA as qualified to perform high complexity clinical laboratory testing.    CEFTRIAXONE  Value in next row Sensitive      4 SENSITIVEThis is a modified FDA-approved test that has been validated and its performance characteristics determined by the reporting laboratory.  This laboratory is certified under the Clinical Laboratory Improvement Amendments CLIA as qualified to perform high complexity clinical laboratory testing.    CIPROFLOXACIN Value in next row Sensitive      4 SENSITIVEThis is a modified FDA-approved test that has been validated and its performance characteristics determined by the reporting laboratory.  This laboratory is certified under the Clinical Laboratory Improvement Amendments CLIA as qualified to perform high complexity clinical laboratory testing.    GENTAMICIN Value in next row Sensitive      4 SENSITIVEThis is a modified FDA-approved test that has been validated and its performance characteristics determined by the reporting laboratory.  This laboratory is certified under the Clinical Laboratory Improvement Amendments CLIA as qualified to perform high complexity clinical laboratory testing.    NITROFURANTOIN Value in next row Sensitive      4 SENSITIVEThis is a modified FDA-approved test that has been validated and its performance characteristics determined by the reporting laboratory.  This laboratory is certified under the  Clinical Laboratory Improvement Amendments CLIA as qualified to perform high complexity clinical laboratory testing.    TRIMETH/SULFA Value in next row Resistant      4 SENSITIVEThis is a modified FDA-approved test that has been validated and its performance characteristics determined by the reporting laboratory.  This laboratory is certified under the Clinical Laboratory Improvement Amendments CLIA as qualified to perform high complexity clinical laboratory testing.    AMPICILLIN /SULBACTAM Value in next row Resistant      4 SENSITIVEThis is a modified FDA-approved test that has been validated and its performance characteristics determined by the reporting laboratory.  This laboratory is certified under the Clinical Laboratory Improvement Amendments CLIA as qualified to perform high complexity clinical laboratory testing.    PIP/TAZO Value in next row Sensitive ug/mL     <=4 SENSITIVEThis is a modified FDA-approved test that has been validated and its performance characteristics determined by the reporting laboratory.  This laboratory is certified under the Clinical Laboratory Improvement Amendments CLIA as qualified to perform high complexity clinical laboratory testing.    MEROPENEM Value in next row Sensitive      <=4 SENSITIVEThis is a modified FDA-approved test that has been validated and its performance characteristics determined by the reporting laboratory.  This laboratory is certified under the Clinical Laboratory Improvement Amendments CLIA as qualified to perform high complexity clinical laboratory testing.    * >=100,000 COLONIES/mL ESCHERICHIA COLI  Resp panel by RT-PCR (RSV, Flu A&B, Covid) Anterior Nasal Swab     Status: None   Collection Time: 08/19/24  2:56 PM   Specimen: Anterior Nasal Swab  Result Value Ref Range Status  SARS Coronavirus 2 by RT PCR NEGATIVE NEGATIVE Final   Influenza A by PCR NEGATIVE NEGATIVE Final   Influenza B by PCR NEGATIVE NEGATIVE Final    Comment:  (NOTE) The Xpert Xpress SARS-CoV-2/FLU/RSV plus assay is intended as an aid in the diagnosis of influenza from Nasopharyngeal swab specimens and should not be used as a sole basis for treatment. Nasal washings and aspirates are unacceptable for Xpert Xpress SARS-CoV-2/FLU/RSV testing.  Fact Sheet for Patients: BloggerCourse.com  Fact Sheet for Healthcare Providers: SeriousBroker.it  This test is not yet approved or cleared by the [place]  FDA and has been authorized for detection and/or diagnosis of SARS-CoV-2 by FDA under an Emergency Use Authorization (EUA). This EUA will remain in effect (meaning this test can be used) for the duration of the COVID-19 declaration under Section 564(b)(1) of the Act, 21 U.S.C. section 360bbb-3(b)(1), unless the authorization is terminated or revoked.     Resp Syncytial Virus by PCR NEGATIVE NEGATIVE Final    Comment: (NOTE) Fact Sheet for Patients: BloggerCourse.com  Fact Sheet for Healthcare Providers: SeriousBroker.it  This test is not yet approved or cleared by the [place]  FDA and has been authorized for detection and/or diagnosis of SARS-CoV-2 by FDA under an Emergency Use Authorization (EUA). This EUA will remain in effect (meaning this test can be used) for the duration of the COVID-19 declaration under Section 564(b)(1) of the Act, 21 U.S.C. section 360bbb-3(b)(1), unless the authorization is terminated or revoked.  Performed at [facility], [address], [place], [place] [address]   Blood culture (routine x 2)     Status: None (Preliminary result)   Collection Time: 08/19/24  9:37 PM   Specimen: BLOOD  Result Value Ref Range Status   Specimen Description BLOOD SITE NOT SPECIFIED  Final   Special Requests   Final    BOTTLES DRAWN AEROBIC AND ANAEROBIC Blood Culture results may not be optimal due to an inadequate  volume of blood received in culture bottles   Culture   Final    NO GROWTH 4 DAYS Performed at [facility], [address], [place], [place] [address]    Report Status PENDING  Incomplete  Blood culture (routine x 2)     Status: None (Preliminary result)   Collection Time: 08/19/24  9:37 PM   Specimen: BLOOD  Result Value Ref Range Status   Specimen Description BLOOD SITE NOT SPECIFIED  Final   Special Requests   Final    BOTTLES DRAWN AEROBIC AND ANAEROBIC Blood Culture results may not be optimal due to an inadequate volume of blood received in culture bottles   Culture   Final    NO GROWTH 4 DAYS Performed at [facility], [address] [place], [place] [address]    Report Status PENDING  Incomplete    Today   Subjective    Ralphael [place] today has no headache,no chest abdominal pain,no new weakness tingling or numbness, feels much better wants to go home today.    Objective   Blood pressure 113/73, pulse 84, temperature 98.4 F (36.9 C), temperature source Oral, resp. rate 18, height 5' 9 (1.753 m), weight 88.5 kg, SpO2 95%.   Intake/Output Summary (Last 24 hours) at 08/24/2024 0733 Last data filed at 08/24/2024 0650 Gross per 24 hour  Intake --  Output 1500 ml  Net -1500 ml    Exam  Awake Alert, No new F.N deficits,    [place].AT,[name] Supple Neck,  Symmetrical Chest wall movement, Good air movement bilaterally, CTAB RRR,No Gallops,   +ve B.Sounds, Abd Soft, Non tender,  No Cyanosis, Clubbing or edema    Data Review   Recent Labs  Lab 08/18/24 0330 08/19/24 1456 08/19/24 1606 08/19/24 1703 08/20/24 0311 08/21/24 0531 08/22/24 0542  WBC 10.2 13.2*  --   --  11.2* 8.0 6.1  HGB 11.7* 12.5* 13.9 12.6* 11.9* 11.3* 10.9*  HCT 36.9* 39.2 41.0 37.0* 37.2* 34.1* 33.5*  PLT 238 257  --   --  220 255 255  MCV 91.6 91.4  --   --  92.1 88.3 90.3  MCH 29.0 29.1  --   --  29.5 29.3 29.4  MCHC 31.7 31.9  --   --  32.0 33.1 32.5  RDW 16.5* 16.6*  --   --   16.6* 16.1* 16.3*  LYMPHSABS  --  0.9  --   --   --  1.0  --   MONOABS  --  1.6*  --   --   --  0.9  --   EOSABS  --  0.0  --   --   --  0.1  --   BASOSABS  --  0.0  --   --   --  0.0  --     Recent Labs  Lab 08/18/24 0330 08/19/24 1530 08/19/24 1606 08/19/24 1703 08/19/24 1924 08/20/24 0311 08/21/24 0531 08/22/24 0542  NA 136  --  133* 134* 135 135 136 137  K 3.7  --  6.0* 4.0 3.8 4.2 4.0 4.0  CL 93*  --  98  --  96* 96* 95* 95*  CO2 34*  --   --   --  27 25 27 27   ANIONGAP 9  --   --   --  12 14 14 15   GLUCOSE 97  --  104*  --  94 91 96 89  BUN 30*  --  60*  --  44* 40* 37* 45*  CREATININE 1.11  --  1.20  --  1.08 1.04 1.10 1.24  AST 25  --   --   --   --   --   --   --   ALT 17  --   --   --   --   --   --   --   ALKPHOS 74  --   --   --   --   --   --   --   BILITOT 0.6  --   --   --   --   --   --   --   ALBUMIN 2.6*  --   --   --   --   --   --   --   LATICACIDVEN  --   --  1.2  --   --   --   --   --   TSH  --   --   --   --   --  2.114  --   --   BNP  --  197.2*  --   --   --   --   --   --   MG 2.2  --   --   --   --   --   --   --   CALCIUM  9.0  --   --   --  8.8* 8.9 9.0 8.9    Total Time in preparing paper work, data  evaluation and todays exam - 35 minutes  Signature  -    Lavada Stank M.D on 08/24/2024 at 7:33 AM   -  To page go to [email or web address]

## 2024-08-24 NOTE — TOC Transition Note (Signed)
 Transition of Care [facility]) - Discharge Note   Patient Details  Name: [name] MRN: [id number] Date of Birth: 07/07/1941  [facility] CM/SW Contact:  Corean JAYSON Canary, RN Phone Number: 08/24/2024, 7:47 AM   Clinical Narrative:     Patient discharging home with home health services, orders in for continuation of home health   No further needs identified High risk readmission, patient declining SNF  Final next level of care: Home w Home Health Services Barriers to Discharge: No Barriers Identified   Patient Goals and CMS Choice Patient states their goals for this hospitalization and ongoing recovery are:: return home with sister CMS Medicare.gov Compare Post Acute Care list provided to:: Patient Choice offered to / list presented to : Patient [place] ownership interest in [facility].provided to:: Patient    Discharge Placement                       Discharge Plan and Services Additional resources added to the After Visit Summary for   In-house Referral: Clinical Social Work Discharge Planning Services: CM Consult Post Acute Care Choice: Home Health                               Social Drivers of Health (SDOH) Interventions SDOH Screenings   Food Insecurity: No Food Insecurity (08/21/2024)  Housing: Low Risk  (08/21/2024)  Transportation Needs: Patient Declined (08/21/2024)  Utilities: Not At Risk (08/21/2024)  Social Connections: Patient Declined (08/21/2024)  Tobacco Use: Medium Risk (08/19/2024)     Readmission Risk Interventions    03/16/2024   12:59 PM 01/31/2024   11:58 AM 10/27/2023   11:13 AM  Readmission Risk Prevention Plan  Transportation Screening Complete Complete Complete  PCP or Specialist Appt within 5-7 Days Complete Complete Complete  Home Care Screening Complete Complete Complete  Medication Review (RN CM) Complete Complete Complete

## 2024-12-02 ENCOUNTER — Other Ambulatory Visit (HOSPITAL_BASED_OUTPATIENT_CLINIC_OR_DEPARTMENT_OTHER): Payer: Self-pay

## 2024-12-03 ENCOUNTER — Other Ambulatory Visit: Payer: Self-pay

## 2024-12-03 ENCOUNTER — Encounter (HOSPITAL_BASED_OUTPATIENT_CLINIC_OR_DEPARTMENT_OTHER): Payer: Self-pay

## 2024-12-03 ENCOUNTER — Emergency Department (HOSPITAL_BASED_OUTPATIENT_CLINIC_OR_DEPARTMENT_OTHER)
Admission: EM | Admit: 2024-12-03 | Discharge: 2024-12-03 | Disposition: A | Discharge status: Home / Self Care | Attending: Emergency Medicine | Admitting: Emergency Medicine

## 2024-12-03 ENCOUNTER — Emergency Department (HOSPITAL_BASED_OUTPATIENT_CLINIC_OR_DEPARTMENT_OTHER): Admitting: Radiology

## 2024-12-03 ENCOUNTER — Emergency Department (HOSPITAL_BASED_OUTPATIENT_CLINIC_OR_DEPARTMENT_OTHER)

## 2024-12-03 DIAGNOSIS — S0083XA Contusion of other part of head, initial encounter: Secondary | ICD-10-CM | POA: Diagnosis not present

## 2024-12-03 DIAGNOSIS — Z7901 Long term (current) use of anticoagulants: Secondary | ICD-10-CM | POA: Diagnosis not present

## 2024-12-03 DIAGNOSIS — W06XXXA Fall from bed, initial encounter: Secondary | ICD-10-CM | POA: Diagnosis not present

## 2024-12-03 DIAGNOSIS — S0993XA Unspecified injury of face, initial encounter: Secondary | ICD-10-CM | POA: Diagnosis present

## 2024-12-03 DIAGNOSIS — W19XXXA Unspecified fall, initial encounter: Secondary | ICD-10-CM

## 2024-12-03 DIAGNOSIS — D689 Coagulation defect, unspecified: Secondary | ICD-10-CM | POA: Insufficient documentation

## 2024-12-03 DIAGNOSIS — R109 Unspecified abdominal pain: Secondary | ICD-10-CM | POA: Diagnosis not present

## 2024-12-03 DIAGNOSIS — S20212A Contusion of left front wall of thorax, initial encounter: Secondary | ICD-10-CM | POA: Insufficient documentation

## 2024-12-03 DIAGNOSIS — M25512 Pain in left shoulder: Secondary | ICD-10-CM | POA: Insufficient documentation

## 2024-12-03 LAB — COMPREHENSIVE METABOLIC PANEL WITH GFR
ALT: 13 U/L (ref 0–44)
AST: 19 U/L (ref 15–41)
Albumin: 3.9 g/dL (ref 3.5–5.0)
Alkaline Phosphatase: 98 U/L (ref 38–126)
Anion gap: 8 (ref 5–15)
BUN: 21 mg/dL (ref 8–23)
CO2: 28 mmol/L (ref 22–32)
Calcium: 9.5 mg/dL (ref 8.9–10.3)
Chloride: 104 mmol/L (ref 98–111)
Creatinine, Ser: 1.24 mg/dL (ref 0.61–1.24)
GFR, Estimated: 58 mL/min — ABNORMAL LOW (ref >60)
Glucose, Bld: 81 mg/dL (ref 70–99)
Potassium: 4 mmol/L (ref 3.5–5.1)
Sodium: 139 mmol/L (ref 135–145)
Total Bilirubin: 0.5 mg/dL (ref 0.0–1.2)
Total Protein: 7.1 g/dL (ref 6.5–8.1)

## 2024-12-03 LAB — CBC WITH DIFFERENTIAL/PLATELET
Abs Immature Granulocytes: 0.01 K/uL (ref 0.00–0.07)
Basophils Absolute: 0 K/uL (ref 0.0–0.1)
Basophils Relative: 1 %
Eosinophils Absolute: 0.3 K/uL (ref 0.0–0.5)
Eosinophils Relative: 5 %
HCT: 28.3 % — ABNORMAL LOW (ref 39.0–52.0)
Hemoglobin: 8.5 g/dL — ABNORMAL LOW (ref 13.0–17.0)
Immature Granulocytes: 0 %
Lymphocytes Relative: 14 %
Lymphs Abs: 0.8 K/uL (ref 0.7–4.0)
MCH: 25.1 pg — ABNORMAL LOW (ref 26.0–34.0)
MCHC: 30 g/dL (ref 30.0–36.0)
MCV: 83.5 fL (ref 80.0–100.0)
Monocytes Absolute: 0.6 K/uL (ref 0.1–1.0)
Monocytes Relative: 11 %
Neutro Abs: 3.9 K/uL (ref 1.7–7.7)
Neutrophils Relative %: 69 %
Platelets: 207 K/uL (ref 150–400)
RBC: 3.39 MIL/uL — ABNORMAL LOW (ref 4.22–5.81)
RDW: 19.4 % — ABNORMAL HIGH (ref 11.5–15.5)
WBC: 5.6 K/uL (ref 4.0–10.5)
nRBC: 0 % (ref 0.0–0.2)

## 2024-12-03 MED ORDER — MORPHINE SULFATE (PF) 4 MG/ML IV SOLN
4.0000 mg | Freq: Once | INTRAVENOUS | Status: AC
  Administered 2024-12-03: 16:00:00 4 mg via INTRAVENOUS
  Filled 2024-12-03: qty 1

## 2024-12-03 MED ORDER — OXYCODONE-ACETAMINOPHEN 5-325 MG PO TABS
1.0000 | ORAL_TABLET | Freq: Once | ORAL | Status: AC
  Administered 2024-12-03: 15:00:00 1 via ORAL
  Filled 2024-12-03: qty 1

## 2024-12-03 MED ORDER — IOHEXOL 300 MG/ML  SOLN
100.0000 mL | Freq: Once | INTRAMUSCULAR | Status: AC | PRN
  Administered 2024-12-03: 16:00:00 100 mL via INTRAVENOUS

## 2024-12-03 NOTE — ED Triage Notes (Signed)
 Presents to ED with c/o fall last night. Pt states he rolled out of bed and hit his chest on a trash can. States he also hit his head. Bandaged wound to L side of chest. Bruising noted to L cheek. Pt on eliquis . Pain to L shoulder

## 2024-12-03 NOTE — ED Notes (Signed)
 Discharge paperwork reviewed entirely with patient, including follow up care. Pain was under control. No prescriptions were called in, but all questions were addressed.  Pt verbalized understanding as well as all parties involved. No questions or concerns voiced at the time of discharge. No acute distress noted. Pt was encouraged to stay adequately hydrated and eat a healthy diet.   Pt was wheeled out to the PVA in a wheelchair without incident.  Pt advised they will notify their PCP immediately.  The pt was instructed to set up and/or review MyChart for their results; and was informed their Providers all have access to the information as well.

## 2024-12-03 NOTE — ED Provider Notes (Signed)
 [place] EMERGENCY DEPARTMENT AT [facility] Provider Note   CSN: [id number] Arrival date & time: 12/03/24  1344     Patient presents with: [name] is a 83 y.o. male.   Patient to ED for evaluation of fall that happened last night after rolling out of bed. He reports hitting his head, left shoulder and chest against a piece of furniture. He reports facial pain, left shoulder pain, left chest and abdominal pain. He Has been up and ambulatory without hip or lower extremity pain. He denies ongoing headache, visual change, nausea, vomiting. No difficulty breathing. He is anticoagulated on Eliquis .   The history is provided by the patient. No language interpreter was used.  Fall       Prior to Admission medications  Medication Sig Start Date End Date Taking? Authorizing Provider  oxyCODONE -acetaminophen  (PERCOCET/ROXICET) 5-325 MG tablet Take 1 tablet by mouth every 8 (eight) hours as needed. 11/20/24  Yes [provider]  apixaban  (ELIQUIS ) 5 MG TABS tablet Take 5 mg by mouth 2 (two) times daily.    [provider]  buprenorphine  (SUBUTEX ) 2 MG SUBL SL tablet Place 2 mg under the tongue 4 (four) times daily as needed (pain). 07/20/24   [provider]  buPROPion  (WELLBUTRIN ) 100 MG tablet Take 1 tablet (100 mg total) by mouth 2 (two) times daily. 08/18/24   Gherghe, Costin M, MD  ferrous sulfate  325 (65 FE) MG tablet Take 325 mg by mouth 2 (two) times daily with a meal. 03/15/20   [provider]  furosemide  (LASIX ) 40 MG tablet Take 1 tablet (40 mg total) by mouth 2 (two) times daily for 5 days. Patient taking differently: Take 40 mg by mouth daily. 06/18/24 08/20/24  Elnor Savant A, DO  lidocaine  (LIDODERM ) 5 % Place 1 patch onto the skin daily as needed. Remove & Discard patch within 12 hours or as directed by MD Patient taking differently: Place 1 patch onto the skin daily. 06/18/24   Elnor Savant LABOR, DO  metoprolol  succinate  (TOPROL -XL) 25 MG 24 hr tablet Take 1 tablet (25 mg total) by mouth daily. 08/24/24   Dennise Lavada POUR, MD  QUEtiapine  (SEROQUEL ) 25 MG tablet Take 25 mg by mouth at bedtime.    [provider]  rOPINIRole  (REQUIP ) 0.25 MG tablet Take 0.25 mg by mouth 2 (two) times daily.  10/09/18   [provider]  silver sulfADIAZINE (SILVADENE) 1 % cream Apply 1 Application topically at bedtime. 08/10/24   [provider]  simvastatin  (ZOCOR ) 40 MG tablet Take 1 tablet (40 mg total) by mouth at bedtime. Patient not taking: Reported on 08/20/2024 08/18/24   Gherghe, Costin M, MD  sucralfate  (CARAFATE ) 1 g tablet Take 1 g by mouth 2 (two) times daily.    [provider]  tamsulosin  (FLOMAX ) 0.4 MG CAPS capsule Take 0.4 mg by mouth in the morning and at bedtime. 09/02/19   [provider]  topiramate  (TOPAMAX ) 25 MG capsule Take 25 mg by mouth 2 (two) times daily.    [provider]    Allergies: Ativan  [lorazepam ], Lyrica [pregabalin], Zanaflex [tizanidine hcl], and Cymbalta [duloxetine hcl]    Review of Systems  Updated Vital Signs BP 102/65   Pulse 60   Temp (!) 97.5 F (36.4 C) (Temporal)   Resp 18   Ht 5' 9 (1.753 m)   Wt 89 kg   SpO2 96%   BMI 28.98 kg/m   Physical Exam Vitals  and nursing note reviewed.  Constitutional:      Appearance: He is well-developed.  HENT:     Head: Normocephalic.  Cardiovascular:     Rate and Rhythm: Normal rate and regular rhythm.     Heart sounds: No murmur heard. Pulmonary:     Effort: Pulmonary effort is normal.     Breath sounds: Normal breath sounds. No wheezing, rhonchi or rales.  Chest:     Chest wall: Tenderness (Tender to anterior and lateral chest wall.) present.  Abdominal:     General: Bowel sounds are normal.     Palpations: Abdomen is soft.     Tenderness: There is abdominal tenderness (Tender along left abdominal wall without bruising or abrasion. No CVA tenderness on the left.). There is no  guarding or rebound.  Musculoskeletal:        General: Normal range of motion.     Cervical back: Normal range of motion and neck supple.     Comments: FROM all extremities. The left shoulder appears to have a deformity consistent with anterior dislocation. No swelling. There is a superficial abrasion to lateral shoulder that appears aged. Distal pulses intact. Neck and spine without midline tenderness.   Skin:    General: Skin is warm and dry.     Comments: Skin tear to upper anterior left chest.   Neurological:     General: No focal deficit present.     Mental Status: He is alert and oriented to person, place, and time.     GCS: GCS eye subscore is 4. GCS verbal subscore is 5. GCS motor subscore is 6.     Cranial Nerves: Cranial nerves 2-12 are intact. No dysarthria or facial asymmetry.     Sensory: Sensation is intact.     Motor: No weakness, abnormal muscle tone or pronator drift.     Coordination: Coordination normal.     (all labs ordered are listed, but only abnormal results are displayed) Labs Reviewed  CBC WITH DIFFERENTIAL/PLATELET - Abnormal; Notable for the following components:      Result Value   RBC 3.39 (*)    Hemoglobin 8.5 (*)    HCT 28.3 (*)    MCH 25.1 (*)    RDW 19.4 (*)    All other components within normal limits  COMPREHENSIVE METABOLIC PANEL WITH GFR - Abnormal; Notable for the following components:   GFR, Estimated 58 (*)    All other components within normal limits    EKG: None  Radiology: CT CHEST ABDOMEN PELVIS W CONTRAST Result Date: 12/03/2024 CLINICAL DATA:  Trauma. EXAM: CT CHEST, ABDOMEN, AND PELVIS WITH CONTRAST TECHNIQUE: Multidetector CT imaging of the chest, abdomen and pelvis was performed following the standard protocol during bolus administration of intravenous contrast. RADIATION DOSE REDUCTION: This exam was performed according to the departmental dose-optimization program which includes automated exposure control, adjustment of the  mA and/or kV according to patient size and/or use of iterative reconstruction technique. CONTRAST:  OMNIPAQUE  IOHEXOL  300 MG/ML  SOLN COMPARISON:  Chest CT dated 06/18/2024. FINDINGS: CT CHEST FINDINGS Cardiovascular: There is no cardiomegaly or pericardial effusion. Postsurgical changes of the aortic valve repair. There is calcification of the mitral annulus. Mild atherosclerotic calcification of the thoracic aorta. None small dilatation or dissection. The origins of the great vessels of the aortic arch and the central pulmonary arteries are patent. Mediastinum/Nodes: No hilar or mediastinal adenopathy. The esophagus is grossly unremarkable no mediastinal fluid collection. Lungs/Pleura: Small right pleural effusion with partial  compressive atelectasis of the right lower lobe. Pneumonia is not excluded. There is mild diffuse chronic interstitial coarsening. No consolidative changes. No pneumothorax. The central airways are patent Musculoskeletal: Osteopenia with degenerative changes of the spine and shoulders. Multiple old bilateral rib fractures. Lower thoracic posterior fusion. No acute osseous pathology. Bilateral by glassy. CT ABDOMEN PELVIS FINDINGS No intra-abdominal free air or free fluid. Hepatobiliary: Indeterminate 15 mm faint high attenuating lesion in the right lobe of the liver (54/2). MRI may provide better characterization on a nonemergent/outpatient basis. No biliary dilatation. The gallbladder is unremarkable. Pancreas: Unremarkable. No pancreatic ductal dilatation or surrounding inflammatory changes. Spleen: Normal in size without focal abnormality. Adrenals/Urinary Tract: The adrenal glands unremarkable. Small left renal cyst. There is no hydronephrosis on either side. The visualized ureters appear unremarkable. Trabeculated appearance of the bladder wall suggestive of chronic bladder outlet obstruction. Stomach/Bowel: There is moderate stool throughout the colon. There is no bowel  obstruction or active inflammation. The appendix is normal. Vascular/Lymphatic: Mild aortoiliac atherosclerotic disease. The IVC is unremarkable. No portal venous gas. There is no adenopathy. Reproductive: The prostate is grossly normal.  No pelvic mass. Other: Small fat containing supraumbilical hernia. Musculoskeletal: Osteopenia with degenerative changes of the spine. No acute osseous pathology. IMPRESSION: 1. No acute/traumatic intrathoracic, abdominal, or pelvic pathology. 2. Small right pleural effusion with partial compressive atelectasis of the right lower lobe. Pneumonia is not excluded. 3. Indeterminate 15 mm faint high attenuating lesion in the right lobe of the liver. MRI may provide better characterization on a nonemergent/outpatient basis. 4. Moderate colonic stool burden. No bowel obstruction. Normal appendix. 5.  Aortic Atherosclerosis (ICD10-I70.0). Electronically Signed   By: Vanetta Chou M.D.   On: 12/03/2024 17:11   CT Maxillofacial Wo Contrast Result Date: 12/03/2024 EXAM: CT OF THE FACE WITHOUT CONTRAST 12/03/2024 04:33:58 PM TECHNIQUE: CT of the face was performed without the administration of intravenous contrast. Multiplanar reformatted images are provided for review. Automated exposure control, iterative reconstruction, and/or weight based adjustment of the mA/kV was utilized to reduce the radiation dose to as low as reasonably achievable. COMPARISON: None available. CLINICAL HISTORY: Facial trauma, blunt. FINDINGS: FACIAL BONES: No acute facial fracture. No mandibular dislocation. No suspicious bone lesion. ORBITS: Globes are intact. No acute traumatic injury. No inflammatory change. Bilateral lens replacement. SINUSES AND MASTOIDS: No acute abnormality. SOFT TISSUES: No acute abnormality. IMPRESSION: 1. No acute facial fracture. Electronically signed by: Morgane Naveau MD 12/03/2024 05:05 PM EST RP Workstation: [id number]   DG Shoulder Left Result Date: 12/03/2024 EXAM: 1  VIEW(S) XRAY OF THE LEFT SHOULDER 12/03/2024 04:40:00 PM COMPARISON: 08/20/2024 CLINICAL HISTORY: deformity after fall FINDINGS: BONES AND JOINTS: High-riding humeral head. 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 Subacromial narrowing. No acute fracture. SOFT TISSUES: No abnormal calcifications. Visualized lung is unremarkable. IMPRESSION: 1. No acute findings. 2. High-riding humeral head. Electronically signed by: Morgane Naveau MD 12/03/2024 05:04 PM EST RP Workstation: [id number]   CT Cervical Spine Wo Contrast Result Date: 12/03/2024 EXAM: CT CERVICAL SPINE WITHOUT CONTRAST 12/03/2024 04:33:58 PM TECHNIQUE: CT of the cervical spine was performed without the administration of intravenous contrast. Multiplanar reformatted images are provided for review. Automated exposure control, iterative reconstruction, and/or weight-based adjustment of the mA/kV was utilized to reduce the radiation dose to as low as reasonably achievable. COMPARISON: None available. CLINICAL HISTORY: Neck trauma (Age >= 65y) Neck trauma (Age >= 65y) FINDINGS: BONES AND ALIGNMENT: There is similar appearing multilevel osseous fusion of the cervical spine with associated occipital T1 surgical hardware.  C6-C7 anterior cervical hardware is present. There is a stable 8 mm anterolisthesis of C6 on C7, a stable 3 mm anterolisthesis of C7 on T1, and a stable grade 1 anterolisthesis of T1 on T2. DEGENERATIVE CHANGES: There are associated multilevel severe degenerative changes of the spine. No severe osseous neural foraminal or central canal stenosis is seen. SOFT TISSUES: At least a small volume right pleural effusion is present. Atherosclerotic plaque of the carotid arteries within the neck is noted. IMPRESSION: 1. No acute cervical spine fracture or traumatic malalignment. 2. Postsurgical changes with multilevel cervical osseous fusion and occiput to T1 posterior instrumentation, as well as C6-C7 anterior fusion hardware.  3. Small right pleural effusion. Electronically signed by: Morgane Naveau MD 12/03/2024 05:03 PM EST RP Workstation: [id number]   CT Head Wo Contrast Result Date: 12/03/2024 EXAM: CT HEAD WITHOUT CONTRAST 12/03/2024 04:33:58 PM TECHNIQUE: CT of the head was performed without the administration of intravenous contrast. Automated exposure control, iterative reconstruction, and/or weight based adjustment of the mA/kV was utilized to reduce the radiation dose to as low as reasonably achievable. COMPARISON: 08/19/2024 CLINICAL HISTORY: Head trauma, moderate-severe; anticoagulated. FINDINGS: BRAIN AND VENTRICLES: No acute hemorrhage. No evidence of acute infarct. Cerebral ventricle sizes are concordant with the degree of cerebral volume loss. Patchy and confluent areas of decreased attenuation are noted throughout the deep and periventricular white matter of the cerebral hemispheres bilaterally suggestive of chronic microvascular ischemic changes. No extra-axial collection. No mass effect or midline shift. ORBITS: Bilateral lens replacement. SINUSES: No acute abnormality. SOFT TISSUES AND SKULL: No acute soft tissue abnormality. Surgical fixation of occipital calvarium noted. No skull fracture. VASCULATURE: Moderate calcific atheromatous disease within carotid siphons. IMPRESSION: 1. No acute intracranial abnormality. Electronically signed by: Morgane Naveau MD 12/03/2024 04:43 PM EST RP Workstation: [id number]     Procedures   Medications Ordered in the ED  oxyCODONE -acetaminophen  (PERCOCET/ROXICET) 5-325 MG per tablet 1 tablet (1 tablet Oral Given 12/03/24 1438)  iohexol  (OMNIPAQUE ) 300 MG/ML solution 100 mL (100 mLs Intravenous Contrast Given 12/03/24 1617)  morphine  (PF) 4 MG/ML injection 4 mg (4 mg Intravenous Given 12/03/24 1602)    Clinical Course as of 12/03/24 1753  Thu Dec 03, 2024  1748 Patient to ED after rolling out of bed early this morning around 2:00 am, hitting the left chest, abdomen,  landing on shoulder. He has a facial abrasion. No bony deformity of the face. The left shoulder appeared c/w anterior dislocation, however, the xray is c/w with no acute changes/injury. CT head, neck, face, chest, abdomen/pelvis - all negative for acute finding per radiology. On recheck, the patient appears comfortable. Pain addressed with IV medications. VSS.   The patient reported that he did not have any of his regular pain medication at home, which he states was oxycodone . On review of the PDMD, he received #90 5/325 mg Percocet on 11/20/24, listed as a 30-day supply. He agrees with this history. Discussed that he can continue this for pain control. Encouraged close PCP follow up if soreness is no better in one week.  [SU]    Clinical Course User Index [SU] Odell Balls, PA-C                                 Medical Decision Making Amount and/or Complexity of Data Reviewed Labs: ordered. Radiology: ordered.  Risk Prescription drug management.        Final diagnoses:  Fall, initial encounter  Contusion of face, initial encounter  Contusion of left chest wall, initial encounter  Coagulopathy    ED Discharge Orders     None          Odell Balls, PA-C 12/03/24 1753

## 2024-12-03 NOTE — Discharge Instructions (Signed)
 As we discussed, follow up with your doctor for recheck in the next week. Continue your pain medications. You can take one oxycodone  5/325 mg three times daily until the soreness from your fall last night improves.   Follow up with your doctor as needed.

## 2024-12-26 ENCOUNTER — Inpatient Hospital Stay (HOSPITAL_COMMUNITY)
Admission: EM | Admit: 2024-12-26 | Discharge: 2024-12-30 | DRG: 291 | Disposition: A | Discharge status: Home / Self Care | Attending: Student | Admitting: Student

## 2024-12-26 ENCOUNTER — Emergency Department (HOSPITAL_COMMUNITY)

## 2024-12-26 ENCOUNTER — Other Ambulatory Visit: Payer: Self-pay

## 2024-12-26 DIAGNOSIS — F419 Anxiety disorder, unspecified: Secondary | ICD-10-CM | POA: Diagnosis present

## 2024-12-26 DIAGNOSIS — I509 Heart failure, unspecified: Secondary | ICD-10-CM | POA: Diagnosis present

## 2024-12-26 DIAGNOSIS — Z9842 Cataract extraction status, left eye: Secondary | ICD-10-CM

## 2024-12-26 DIAGNOSIS — E785 Hyperlipidemia, unspecified: Secondary | ICD-10-CM | POA: Diagnosis present

## 2024-12-26 DIAGNOSIS — Z87891 Personal history of nicotine dependence: Secondary | ICD-10-CM

## 2024-12-26 DIAGNOSIS — Z8673 Personal history of transient ischemic attack (TIA), and cerebral infarction without residual deficits: Secondary | ICD-10-CM

## 2024-12-26 DIAGNOSIS — R0902 Hypoxemia: Secondary | ICD-10-CM | POA: Diagnosis present

## 2024-12-26 DIAGNOSIS — H409 Unspecified glaucoma: Secondary | ICD-10-CM | POA: Diagnosis present

## 2024-12-26 DIAGNOSIS — R0603 Acute respiratory distress: Secondary | ICD-10-CM | POA: Diagnosis present

## 2024-12-26 DIAGNOSIS — I110 Hypertensive heart disease with heart failure: Principal | ICD-10-CM | POA: Diagnosis present

## 2024-12-26 DIAGNOSIS — N179 Acute kidney failure, unspecified: Secondary | ICD-10-CM | POA: Diagnosis present

## 2024-12-26 DIAGNOSIS — I5033 Acute on chronic diastolic (congestive) heart failure: Secondary | ICD-10-CM | POA: Diagnosis present

## 2024-12-26 DIAGNOSIS — I4821 Permanent atrial fibrillation: Secondary | ICD-10-CM | POA: Diagnosis present

## 2024-12-26 DIAGNOSIS — G894 Chronic pain syndrome: Secondary | ICD-10-CM | POA: Diagnosis present

## 2024-12-26 DIAGNOSIS — Z79899 Other long term (current) drug therapy: Secondary | ICD-10-CM

## 2024-12-26 DIAGNOSIS — G2581 Restless legs syndrome: Secondary | ICD-10-CM | POA: Diagnosis present

## 2024-12-26 DIAGNOSIS — M75102 Unspecified rotator cuff tear or rupture of left shoulder, not specified as traumatic: Secondary | ICD-10-CM

## 2024-12-26 DIAGNOSIS — Z888 Allergy status to other drugs, medicaments and biological substances status: Secondary | ICD-10-CM

## 2024-12-26 DIAGNOSIS — I350 Nonrheumatic aortic (valve) stenosis: Secondary | ICD-10-CM | POA: Diagnosis present

## 2024-12-26 DIAGNOSIS — Z96652 Presence of left artificial knee joint: Secondary | ICD-10-CM | POA: Diagnosis present

## 2024-12-26 DIAGNOSIS — D638 Anemia in other chronic diseases classified elsewhere: Secondary | ICD-10-CM | POA: Diagnosis present

## 2024-12-26 DIAGNOSIS — I951 Orthostatic hypotension: Secondary | ICD-10-CM | POA: Diagnosis not present

## 2024-12-26 DIAGNOSIS — E871 Hypo-osmolality and hyponatremia: Secondary | ICD-10-CM | POA: Diagnosis present

## 2024-12-26 DIAGNOSIS — Z7901 Long term (current) use of anticoagulants: Secondary | ICD-10-CM

## 2024-12-26 DIAGNOSIS — S0990XA Unspecified injury of head, initial encounter: Secondary | ICD-10-CM | POA: Diagnosis present

## 2024-12-26 DIAGNOSIS — Z952 Presence of prosthetic heart valve: Secondary | ICD-10-CM | POA: Diagnosis not present

## 2024-12-26 DIAGNOSIS — Z981 Arthrodesis status: Secondary | ICD-10-CM | POA: Diagnosis not present

## 2024-12-26 DIAGNOSIS — N400 Enlarged prostate without lower urinary tract symptoms: Secondary | ICD-10-CM | POA: Diagnosis present

## 2024-12-26 DIAGNOSIS — F32A Depression, unspecified: Secondary | ICD-10-CM | POA: Diagnosis present

## 2024-12-26 DIAGNOSIS — Z9841 Cataract extraction status, right eye: Secondary | ICD-10-CM

## 2024-12-26 LAB — CBC WITH DIFFERENTIAL/PLATELET
Abs Immature Granulocytes: 0.03 K/uL (ref 0.00–0.07)
Basophils Absolute: 0 K/uL (ref 0.0–0.1)
Basophils Relative: 0 %
Eosinophils Absolute: 0 K/uL (ref 0.0–0.5)
Eosinophils Relative: 0 %
HCT: 28.7 % — ABNORMAL LOW (ref 39.0–52.0)
Hemoglobin: 8.9 g/dL — ABNORMAL LOW (ref 13.0–17.0)
Immature Granulocytes: 1 %
Lymphocytes Relative: 22 %
Lymphs Abs: 1.3 K/uL (ref 0.7–4.0)
MCH: 25.4 pg — ABNORMAL LOW (ref 26.0–34.0)
MCHC: 31 g/dL (ref 30.0–36.0)
MCV: 81.8 fL (ref 80.0–100.0)
Monocytes Absolute: 0.5 K/uL (ref 0.1–1.0)
Monocytes Relative: 8 %
Neutro Abs: 4 K/uL (ref 1.7–7.7)
Neutrophils Relative %: 69 %
Platelets: 193 K/uL (ref 150–400)
RBC: 3.51 MIL/uL — ABNORMAL LOW (ref 4.22–5.81)
RDW: 20.5 % — ABNORMAL HIGH (ref 11.5–15.5)
WBC: 5.8 K/uL (ref 4.0–10.5)
nRBC: 0 % (ref 0.0–0.2)

## 2024-12-26 LAB — PRO BRAIN NATRIURETIC PEPTIDE: Pro Brain Natriuretic Peptide: 2011 pg/mL — ABNORMAL HIGH

## 2024-12-26 LAB — BASIC METABOLIC PANEL WITH GFR
Anion gap: 13 (ref 5–15)
BUN: 42 mg/dL — ABNORMAL HIGH (ref 8–23)
CO2: 21 mmol/L — ABNORMAL LOW (ref 22–32)
Calcium: 9.3 mg/dL (ref 8.9–10.3)
Chloride: 99 mmol/L (ref 98–111)
Creatinine, Ser: 1.3 mg/dL — ABNORMAL HIGH (ref 0.61–1.24)
GFR, Estimated: 55 mL/min — ABNORMAL LOW
Glucose, Bld: 108 mg/dL — ABNORMAL HIGH (ref 70–99)
Potassium: 4.4 mmol/L (ref 3.5–5.1)
Sodium: 133 mmol/L — ABNORMAL LOW (ref 135–145)

## 2024-12-26 LAB — TROPONIN T, HIGH SENSITIVITY
Troponin T High Sensitivity: 30 ng/L — ABNORMAL HIGH (ref 0–19)
Troponin T High Sensitivity: 28 ng/L — ABNORMAL HIGH (ref 0–19)

## 2024-12-26 MED ORDER — OXYCODONE-ACETAMINOPHEN 5-325 MG PO TABS
1.0000 | ORAL_TABLET | Freq: Once | ORAL | Status: AC
  Administered 2024-12-26: 1 via ORAL
  Filled 2024-12-26: qty 1

## 2024-12-26 MED ORDER — FENTANYL CITRATE (PF) 50 MCG/ML IJ SOSY
50.0000 ug | PREFILLED_SYRINGE | Freq: Once | INTRAMUSCULAR | Status: AC
  Administered 2024-12-26: 50 ug via INTRAVENOUS
  Filled 2024-12-26: qty 1

## 2024-12-26 MED ORDER — QUETIAPINE FUMARATE 25 MG PO TABS
25.0000 mg | ORAL_TABLET | Freq: Every day | ORAL | Status: DC
  Administered 2024-12-27 – 2024-12-29 (×3): 25 mg via ORAL
  Filled 2024-12-26 (×4): qty 1

## 2024-12-26 MED ORDER — ACETAMINOPHEN 325 MG PO TABS: 650.0000 mg | ORAL_TABLET | Freq: Four times a day (QID) | ORAL | Status: DC | PRN

## 2024-12-26 MED ORDER — ACETAMINOPHEN 650 MG RE SUPP: 650.0000 mg | Freq: Four times a day (QID) | RECTAL | Status: DC | PRN

## 2024-12-26 MED ORDER — ONDANSETRON HCL 4 MG/2ML IJ SOLN: 4.0000 mg | Freq: Four times a day (QID) | INTRAMUSCULAR | Status: DC | PRN

## 2024-12-26 MED ORDER — FUROSEMIDE 10 MG/ML IJ SOLN
40.0000 mg | Freq: Once | INTRAMUSCULAR | Status: AC
  Administered 2024-12-26: 40 mg via INTRAVENOUS
  Filled 2024-12-26: qty 4

## 2024-12-26 MED ORDER — POLYETHYLENE GLYCOL 3350 17 G PO PACK
17.0000 g | PACK | Freq: Every day | ORAL | Status: DC | PRN
  Administered 2024-12-29 – 2024-12-30 (×2): 17 g via ORAL
  Filled 2024-12-26 (×3): qty 1

## 2024-12-26 MED ORDER — MORPHINE SULFATE (PF) 2 MG/ML IV SOLN: 2.0000 mg | INTRAVENOUS | Status: DC | PRN

## 2024-12-26 MED ORDER — HYDROCODONE-ACETAMINOPHEN 5-325 MG PO TABS
1.0000 | ORAL_TABLET | ORAL | Status: DC | PRN
  Administered 2024-12-26 – 2024-12-28 (×8): 2 via ORAL
  Filled 2024-12-26 (×8): qty 2

## 2024-12-26 MED ORDER — BISACODYL 5 MG PO TBEC: 5.0000 mg | DELAYED_RELEASE_TABLET | Freq: Every day | ORAL | Status: DC | PRN

## 2024-12-26 MED ORDER — ONDANSETRON HCL 4 MG PO TABS: 4.0000 mg | ORAL_TABLET | Freq: Four times a day (QID) | ORAL | Status: DC | PRN

## 2024-12-26 MED ORDER — FUROSEMIDE 10 MG/ML IJ SOLN
80.0000 mg | Freq: Two times a day (BID) | INTRAMUSCULAR | Status: DC
  Administered 2024-12-27 – 2024-12-28 (×3): 80 mg via INTRAVENOUS
  Filled 2024-12-26 (×3): qty 8

## 2024-12-26 MED ORDER — ROPINIROLE HCL 0.25 MG PO TABS
0.2500 mg | ORAL_TABLET | Freq: Two times a day (BID) | ORAL | Status: DC
  Administered 2024-12-27 – 2024-12-30 (×7): 0.25 mg via ORAL
  Filled 2024-12-26 (×9): qty 1
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
## 2024-12-27 ENCOUNTER — Encounter (HOSPITAL_COMMUNITY): Payer: Self-pay | Admitting: Family Medicine

## 2024-12-27 LAB — CBC
HCT: 28.9 % — ABNORMAL LOW (ref 39.0–52.0)
Hemoglobin: 9.1 g/dL — ABNORMAL LOW (ref 13.0–17.0)
MCH: 25 pg — ABNORMAL LOW (ref 26.0–34.0)
MCHC: 31.5 g/dL (ref 30.0–36.0)
MCV: 79.4 fL — ABNORMAL LOW (ref 80.0–100.0)
Platelets: 207 K/uL (ref 150–400)
RBC: 3.64 MIL/uL — ABNORMAL LOW (ref 4.22–5.81)
RDW: 20 % — ABNORMAL HIGH (ref 11.5–15.5)
WBC: 4.7 K/uL (ref 4.0–10.5)
nRBC: 0 % (ref 0.0–0.2)

## 2024-12-27 LAB — COMPREHENSIVE METABOLIC PANEL WITH GFR
ALT: 25 U/L (ref 0–44)
AST: 32 U/L (ref 15–41)
Albumin: 3.6 g/dL (ref 3.5–5.0)
Alkaline Phosphatase: 89 U/L (ref 38–126)
Anion gap: 10 (ref 5–15)
BUN: 36 mg/dL — ABNORMAL HIGH (ref 8–23)
CO2: 25 mmol/L (ref 22–32)
Calcium: 9.4 mg/dL (ref 8.9–10.3)
Chloride: 101 mmol/L (ref 98–111)
Creatinine, Ser: 1.27 mg/dL — ABNORMAL HIGH (ref 0.61–1.24)
GFR, Estimated: 56 mL/min — ABNORMAL LOW
Glucose, Bld: 80 mg/dL (ref 70–99)
Potassium: 4.7 mmol/L (ref 3.5–5.1)
Sodium: 136 mmol/L (ref 135–145)
Total Bilirubin: 0.3 mg/dL (ref 0.0–1.2)
Total Protein: 6.6 g/dL (ref 6.5–8.1)

## 2024-12-27 LAB — PROTIME-INR
INR: 1.2 (ref 0.8–1.2)
Prothrombin Time: 16 s — ABNORMAL HIGH (ref 11.4–15.2)

## 2024-12-27 MED ORDER — LIDOCAINE 5 % EX PTCH
1.0000 | MEDICATED_PATCH | CUTANEOUS | Status: DC
  Administered 2024-12-27: 1 via TRANSDERMAL
  Filled 2024-12-27: qty 1

## 2024-12-27 MED ORDER — ROPINIROLE HCL 0.25 MG PO TABS
0.2500 mg | ORAL_TABLET | Freq: Once | ORAL | Status: AC
  Administered 2024-12-27: 0.25 mg via ORAL
  Filled 2024-12-27: qty 1

## 2024-12-27 MED ORDER — APIXABAN 5 MG PO TABS
5.0000 mg | ORAL_TABLET | Freq: Two times a day (BID) | ORAL | Status: DC
  Administered 2024-12-27 – 2024-12-30 (×6): 5 mg via ORAL
  Filled 2024-12-27 (×4): qty 1
  Filled 2024-12-27: qty 2
  Filled 2024-12-27 (×2): qty 1

## 2024-12-27 MED ORDER — TAMSULOSIN HCL 0.4 MG PO CAPS
0.4000 mg | ORAL_CAPSULE | Freq: Every day | ORAL | Status: DC
  Administered 2024-12-28 – 2024-12-30 (×3): 0.4 mg via ORAL
  Filled 2024-12-27 (×4): qty 1

## 2024-12-27 MED ORDER — BUPROPION HCL 100 MG PO TABS
100.0000 mg | ORAL_TABLET | Freq: Two times a day (BID) | ORAL | Status: DC
  Administered 2024-12-27 – 2024-12-30 (×7): 100 mg via ORAL
  Filled 2024-12-27 (×9): qty 1

## 2024-12-27 MED ORDER — METOPROLOL SUCCINATE ER 50 MG PO TB24
25.0000 mg | ORAL_TABLET | Freq: Every day | ORAL | Status: DC
  Administered 2024-12-27 – 2024-12-28 (×2): 25 mg via ORAL
  Filled 2024-12-27 (×3): qty 1
  Filled 2024-12-27: qty 0.5

## 2024-12-27 MED ORDER — TOPIRAMATE 25 MG PO TABS
25.0000 mg | ORAL_TABLET | Freq: Two times a day (BID) | ORAL | Status: DC
  Administered 2024-12-27 – 2024-12-30 (×7): 25 mg via ORAL
  Filled 2024-12-27 (×9): qty 1

## 2024-12-27 MED ORDER — IPRATROPIUM-ALBUTEROL 0.5-2.5 (3) MG/3ML IN SOLN: 3.0000 mL | RESPIRATORY_TRACT | Status: DC | PRN

## 2024-12-27 NOTE — Assessment & Plan Note (Signed)
 Takes p.o. Lasix  40 mg daily at home. Received Lasix  in the ED. - Continue Lasix  twice daily, decrease frequency as appropriate. - Continue home medications - Heart healthy diet - Continuous cardiac monitoring - Daily weights, strict INO's - Continue home anticoagulation - Wean O2 as appropriate, goal greater than 92% - PT/OT eval
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
## 2024-12-27 NOTE — Progress Notes (Signed)
 Patient admitted earlier this morning for possible CHF exacerbation and admitted and started on IV Lasix .  Patient seen and examined at bedside and plan of care discussed with him.  I have reviewed patient's medical records including this morning's H&P, current vitals, labs, medications myself.  Continue IV Lasix .  Strict input and output.  Daily weights.

## 2024-12-27 NOTE — Progress Notes (Signed)
 Mobility Specialist - Progress Note   12/27/24 1525  Mobility  Activity Pivoted/transferred from bed to chair  Level of Assistance Minimal assist, patient does 75% or more  Assistive Device Front wheel walker  Distance Ambulated (ft) 3 ft  Range of Motion/Exercises Active  Activity Response Tolerated well  Mobility visit 1 Mobility  Mobility Specialist Start Time (ACUTE ONLY) 1430  Mobility Specialist Stop Time (ACUTE ONLY) 1500  Mobility Specialist Time Calculation (min) (ACUTE ONLY) 30 min   Pt was found in bed and agreeable to mobilize. C/o some BUE pain. At EOS was left on recliner chair with all needs met. NT in room.   [name] Leos,  Mobility Specialist Can be reached via Secure Chat

## 2024-12-28 LAB — MAGNESIUM: Magnesium: 2.4 mg/dL (ref 1.7–2.4)

## 2024-12-28 LAB — BASIC METABOLIC PANEL WITH GFR
Anion gap: 10 (ref 5–15)
BUN: 37 mg/dL — ABNORMAL HIGH (ref 8–23)
CO2: 29 mmol/L (ref 22–32)
Calcium: 9 mg/dL (ref 8.9–10.3)
Chloride: 99 mmol/L (ref 98–111)
Creatinine, Ser: 1.34 mg/dL — ABNORMAL HIGH (ref 0.61–1.24)
GFR, Estimated: 53 mL/min — ABNORMAL LOW
Glucose, Bld: 77 mg/dL (ref 70–99)
Potassium: 3.8 mmol/L (ref 3.5–5.1)
Sodium: 137 mmol/L (ref 135–145)

## 2024-12-28 MED ORDER — ACETAMINOPHEN 650 MG RE SUPP
650.0000 mg | Freq: Four times a day (QID) | RECTAL | Status: DC | PRN
  Filled 2024-12-28: qty 1

## 2024-12-28 MED ORDER — DICLOFENAC SODIUM 1 % EX GEL
4.0000 g | Freq: Two times a day (BID) | CUTANEOUS | Status: DC
  Administered 2024-12-28 – 2024-12-30 (×4): 4 g via TOPICAL
  Filled 2024-12-28 (×2): qty 100

## 2024-12-28 MED ORDER — LIDOCAINE 5 % EX PTCH
2.0000 | MEDICATED_PATCH | CUTANEOUS | Status: DC
  Administered 2024-12-28 – 2024-12-29 (×2): 2 via TRANSDERMAL
  Filled 2024-12-28 (×3): qty 2

## 2024-12-28 MED ORDER — ONDANSETRON HCL 4 MG PO TABS
4.0000 mg | ORAL_TABLET | Freq: Four times a day (QID) | ORAL | Status: DC | PRN
  Filled 2024-12-28: qty 1

## 2024-12-28 MED ORDER — HYDROCODONE-ACETAMINOPHEN 5-325 MG PO TABS
2.0000 | ORAL_TABLET | Freq: Three times a day (TID) | ORAL | Status: DC | PRN
  Administered 2024-12-28 – 2024-12-30 (×5): 2 via ORAL
  Filled 2024-12-28 (×5): qty 2

## 2024-12-28 MED ORDER — HYDROCODONE-ACETAMINOPHEN 5-325 MG PO TABS: 2.0000 | ORAL_TABLET | Freq: Three times a day (TID) | ORAL | 0 refills | Status: DC | PRN

## 2024-12-28 MED ORDER — ACETAMINOPHEN 325 MG PO TABS
650.0000 mg | ORAL_TABLET | Freq: Four times a day (QID) | ORAL | Status: DC | PRN
  Filled 2024-12-28 (×2): qty 2

## 2024-12-28 MED ORDER — SENNOSIDES-DOCUSATE SODIUM 8.6-50 MG PO TABS
1.0000 | ORAL_TABLET | Freq: Every day | ORAL | Status: DC
  Administered 2024-12-28: 1 via ORAL
  Filled 2024-12-28 (×2): qty 1

## 2024-12-28 MED ORDER — FUROSEMIDE 10 MG/ML IJ SOLN
40.0000 mg | Freq: Every day | INTRAMUSCULAR | Status: DC
  Filled 2024-12-28: qty 4

## 2024-12-28 MED ORDER — METHOCARBAMOL 750 MG PO TABS
750.0000 mg | ORAL_TABLET | Freq: Three times a day (TID) | ORAL | Status: DC | PRN
  Administered 2024-12-28 – 2024-12-30 (×4): 750 mg via ORAL
  Filled 2024-12-28 (×3): qty 1
  Filled 2024-12-28 (×2): qty 2
  Filled 2024-12-28: qty 1.5
  Filled 2024-12-28: qty 1

## 2024-12-28 MED ORDER — FUROSEMIDE 10 MG/ML IJ SOLN: 40.0000 mg | Freq: Two times a day (BID) | INTRAMUSCULAR | Status: DC

## 2024-12-28 MED ORDER — MORPHINE SULFATE (PF) 2 MG/ML IV SOLN
1.0000 mg | INTRAVENOUS | Status: DC | PRN
  Administered 2024-12-28 – 2024-12-30 (×4): 1 mg via INTRAVENOUS
  Filled 2024-12-28 (×5): qty 1

## 2024-12-28 MED ORDER — BENZONATATE 100 MG PO CAPS
100.0000 mg | ORAL_CAPSULE | Freq: Three times a day (TID) | ORAL | Status: DC | PRN
  Administered 2024-12-29: 100 mg via ORAL
  Filled 2024-12-28: qty 1

## 2024-12-28 MED ORDER — ACETAMINOPHEN 500 MG PO TABS
1000.0000 mg | ORAL_TABLET | Freq: Two times a day (BID) | ORAL | Status: DC
  Administered 2024-12-28 – 2024-12-30 (×4): 1000 mg via ORAL
  Filled 2024-12-28 (×6): qty 2

## 2024-12-28 MED ORDER — SODIUM CHLORIDE 0.9 % IV BOLUS
750.0000 mL | Freq: Once | INTRAVENOUS | Status: AC
  Administered 2024-12-28: 750 mL via INTRAVENOUS

## 2024-12-28 MED ORDER — FUROSEMIDE 10 MG/ML IJ SOLN
40.0000 mg | Freq: Two times a day (BID) | INTRAMUSCULAR | Status: DC
  Filled 2024-12-28 (×2): qty 4
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
## 2024-12-28 NOTE — Progress Notes (Signed)
 Notified by nurse that patient is having uncontrolled chronic pain.  I ordered pain meds: Tylenol , Robaxin , lidocaine  patch, Norco, Voltaren  gel.  I have added IV morphine  for now but this may need to be reconsidered this patient is scheduled to move to the home setting i.e. hospital at home tomorrow.  Also added aqua thermia.  Will defer to rounding MD as to whether patient is appropriate to transition to long-acting narcotics.  PCP is attempting to get patient enrolled with pain clinic at [facility] and I suspect this would be the next step.  Patient unable to utilize NSAIDs secondary to chronic kidney disease.  Unable to utilize pregabalin due to prior undesired side effects.  Suspect may have same side effects with gabapentin  so this was not ordered either.

## 2024-12-28 NOTE — Progress Notes (Signed)
 On our planned pick up for this patient for hospital at home transfer, SBPs were in 80s-90s on orthostatic check. Mildly weak and fatigued. I suspect over diuresis. -3.5L fluid since admission Giving 750cc NS bolus  HOLDING Transfer tonight  Holding offending meds Will work on admitting him in the morning pending BP check.  Vitals:   12/28/24 1500 12/28/24 1927 12/28/24 1929 12/28/24 1933  BP: 99/75 (!) 86/54 91/61 (!) 83/55  Pulse:  73 70 77  Temp:  97.8 F (36.6 C)    Resp: 18 16    Height:      Weight:      SpO2: 99% 97% 97% 100%  TempSrc:  Oral    BMI (Calculated):

## 2024-12-28 NOTE — Evaluation (Signed)
 Occupational Therapy Evaluation Patient Details Name: [name] MRN: [id number] DOB: February 27, 1941 Today's Date: 12/28/2024   History of Present Illness   Pt is an 84y.o. male admitted on 12/26/24 due to a fall to the ground while getting into his car. PMH: HLD, HTN, OA, CHF, a-fib, CVA. Imaging negative for any intracranial bleeding.     Clinical Impressions PTA, patient lives at home with sister and uses a [facility] and has a RW as needed as well as DME/AE for mod I ADL's and mobility prior to hospital stay. Reports sister able to assist as needed. Currently, patient presents with deficits outlined below (see OT Problem List for details) most significantly pain, decreased B UE ROM, generalized muscle weakness, balance and activity tolerance deficits. Patient will benefit from caregiver assist and support, HHOT services once potential H@H  services are completed upon discharge from acute hospital setting. Patient requires continued Acute care hospital level OT services to progress safety and functional performance and allow for discharge.       If plan is discharge home, recommend the following:   A lot of help with walking and/or transfers;A lot of help with bathing/dressing/bathroom;Assistance with cooking/housework;Assistance with feeding;Assist for transportation;Help with stairs or ramp for entrance     Functional Status Assessment   Patient has had a recent decline in their functional status and demonstrates the ability to make significant improvements in function in a reasonable and predictable amount of time.     Equipment Recommendations   None recommended by OT (patient reports he has all needed DME/AE)     Recommendations for Other Services         Precautions/Restrictions   Precautions Precautions: Fall Recall of Precautions/Restrictions: Intact Restrictions Weight Bearing Restrictions Per Provider Order: No     Mobility Bed Mobility Overal bed mobility:  Needs Assistance Bed Mobility: Supine to Sit     Supine to sit: Contact guard     General bed mobility comments: completed on flat bed to replicate home environment, required increased time and effort with CGA to ensure pt didn't lose balance backwards. Mild dizziness reported in sitting, resolves with time    Transfers Overall transfer level: Needs assistance Equipment used: Rolling walker (2 wheels) Transfers: Sit to/from Stand Sit to Stand: Min assist           General transfer comment: MIN A to stand from low bed surface      Balance Overall balance assessment: Needs assistance Sitting-balance support: Feet supported Sitting balance-Leahy Scale: Fair     Standing balance support: During functional activity, Reliant on assistive device for balance, Bilateral upper extremity supported Standing balance-Leahy Scale: Poor Standing balance comment: initial posterior lean when standing requiring MIN A, able to maintain balance standing otherwise with use of RW                           ADL either performed or assessed with clinical judgement   ADL Overall ADL's : Needs assistance/impaired Eating/Feeding: Set up;Sitting   Grooming: Wash/dry hands;Wash/dry face;Oral care;Set up;Sitting   Upper Body Bathing: Contact guard assist;Sitting   Lower Body Bathing: Contact guard assist;Sit to/from stand   Upper Body Dressing : Set up;Sitting   Lower Body Dressing: Sit to/from stand;Minimal assistance Lower Body Dressing Details (indicate cue type and reason): reports he uses a reacher, sock aide and [facility] at home Toilet Transfer: Minimal assistance;Rolling walker (2 wheels);Regular Toilet;Ambulation   Toileting- Clothing Manipulation and Hygiene: Set up;Sitting/lateral  lean       Functional mobility during ADLs: Minimal assistance;Rolling walker (2 wheels) General ADL Comments: increased time for all tasks due to prolonged bed rest in hospital     Vision  Baseline Vision/History: 0 No visual deficits              Pertinent Vitals/Pain Pain Assessment Pain Assessment: Faces Faces Pain Scale: Hurts even more Pain Location: generalized pain, L shoulder Pain Descriptors / Indicators: Dull, Grimacing, Discomfort Pain Intervention(s): Limited activity within patient's tolerance, Monitored during session, Repositioned     Extremity/Trunk Assessment Upper Extremity Assessment Upper Extremity Assessment: Generalized weakness;Right hand dominant (B RTC limitations with 0-100 degrees AROM B shoulders at baseline)   Lower Extremity Assessment Lower Extremity Assessment: Defer to PT evaluation RLE Deficits / Details: MMT grossly 4-/5 RLE Sensation: WNL LLE Deficits / Details: MMT grossly 4-/5 LLE Sensation: WNL   Cervical / Trunk Assessment Cervical / Trunk Assessment: Normal   Communication Communication Communication: No apparent difficulties Factors Affecting Communication: Difficulty expressing self   Cognition Arousal: Alert Behavior During Therapy: WFL for tasks assessed/performed Cognition: No apparent impairments                               Following commands: Intact       Cueing  General Comments   Cueing Techniques: Verbal cues;Tactile cues  remained on RA with no SOB, cues for ECT principles ie figure 4 and pacing throughout session, no skin issues identified           Home Living Family/patient expects to be discharged to:: Private residence Living Arrangements: Other relatives (sister) Available Help at Discharge: Family;Available 24 hours/day Type of Home: House Home Access: Level entry     Home Layout: One level     Bathroom Shower/Tub: Producer, Television/film/video: Handicapped height Bathroom Accessibility: Yes   Home Equipment: Wheelchair - Forensic Psychologist (2 wheels);Cane - single point;Rollator (4 wheels)   Additional Comments: Pt lives with sister, available 24/7       Prior Functioning/Environment Prior Level of Function : Independent/Modified Independent             Mobility Comments: reports ambulating with cane ADLs Comments: reports driving and independence    OT Problem List: Decreased strength;Decreased range of motion;Decreased activity tolerance;Impaired balance (sitting and/or standing);Pain   OT Treatment/Interventions: Self-care/ADL training;Therapeutic exercise;Neuromuscular education;Energy conservation;DME and/or AE instruction;Therapeutic activities;Patient/family education;Balance training      OT Goals(Current goals can be found in the care plan section)   Acute Rehab OT Goals Patient Stated Goal: to get stronger OT Goal Formulation: With patient Time For Goal Achievement: 01/11/25 Potential to Achieve Goals: Good ADL Goals Pt Will Perform Lower Body Bathing: with modified independence;with adaptive equipment;sit to/from stand Pt Will Perform Lower Body Dressing: with modified independence;sit to/from stand;with adaptive equipment Pt Will Transfer to Toilet: with modified independence;ambulating;regular height toilet Pt Will Perform Toileting - Clothing Manipulation and hygiene: with modified independence;sit to/from stand   OT Frequency:  Min 2X/week       AM-PAC OT 6 Clicks Daily Activity     Outcome Measure Help from another person eating meals?: A Little Help from another person taking care of personal grooming?: A Little Help from another person toileting, which includes using toliet, bedpan, or urinal?: A Little Help from another person bathing (including washing, rinsing, drying)?: A Little Help from another person to put on and taking off  regular upper body clothing?: A Little Help from another person to put on and taking off regular lower body clothing?: A Little 6 Click Score: 18   End of Session Equipment Utilized During Treatment: Gait belt;Rolling walker (2 wheels) Nurse Communication: Mobility  status  Activity Tolerance: Patient tolerated treatment well Patient left: in chair;with call bell/phone within reach;with chair alarm set  OT Visit Diagnosis: Unsteadiness on feet (R26.81);Muscle weakness (generalized) (M62.81);Pain                Time: 8864-8794 OT Time Calculation (min): 30 min Charges:  OT General Charges $OT Visit: 1 Visit OT Evaluation $OT Eval Low Complexity: 1 Low OT Treatments $Self Care/Home Management : 8-22 mins Iyani Dresner OT/L Acute Rehabilitation Department  [phone]  12/28/2024, 12:38 PM

## 2024-12-28 NOTE — Progress Notes (Signed)
 " PROGRESS NOTE    [name]  FMW:[id number] DOB: 1940/12/26 DOA: 12/26/2024 PCP: Burney Darice LITTIE, MD   Brief Narrative:  84 year old male with history of hypertension, hyperlipidemia, osteoarthritis, chronic diastolic CHF, permanent A-fib, restless leg syndrome, chronic pain syndrome, BPH, anxiety/depression and unspecified CVA presented with worsening shortness of breath and fall.  On presentation, he was found to be hypoxic and in mild respiratory distress and was admitted for CHF exacerbation and started on IV Lasix .  Assessment & Plan:   Acute on chronic CHF exacerbation Hypertension - Continue Lasix  IV.  Strict input output.  Daily weights.  Fluid restriction.  Diuresing well.  Continue metoprolol  succinate.  Permanent A-fib - Rate controlled.  Continue Eliquis  and metoprolol   Anxiety and depression--continue Seroquel , bupropion  and Topamax   Restless leg syndrome - Continue Requip   Anemia of chronic disease Macrocytosis - Hemoglobin stable.  No signs of bleeding.  Monitor intermittently  Hyponatremia - Resolved  Chronic pain syndrome with opiate dependence - Continue current pain management regimen.  Outpatient follow-up with PCP and/for pain management  Physical deconditioning - PT eval   DVT prophylaxis: Eliquis  Code Status: Full Family Communication: None at bedside Disposition Plan: Status is: Inpatient Remains inpatient appropriate because: Of severity of illness  Consultants: None  Procedures: None  Antimicrobials: None   Subjective: Patient seen and examined at bedside.  No vomiting, fever, or agitation reported.  Feels slightly better.  Objective: Vitals:   12/27/24 2158 12/28/24 0018 12/28/24 0500 12/28/24 0520  BP: (!) 95/58 104/61  112/87  Pulse: 68 67  88  Resp: 20 17  18   Temp: 97.6 F (36.4 C)   97.7 F (36.5 C)  TempSrc: Oral   Oral  SpO2: 96% 97%  97%  Weight:   79.9 kg   Height:        Intake/Output Summary (Last 24 hours)  at 12/28/2024 0754 Last data filed at 12/28/2024 0521 Gross per 24 hour  Intake 660 ml  Output 1800 ml  Net -1140 ml   Filed Weights   12/26/24 1945 12/27/24 0438 12/28/24 0500  Weight: 84.5 kg 85.9 kg 79.9 kg    Examination:  General exam: Appears calm and comfortable.  Elderly male lying in bed Respiratory system: Bilateral decreased breath sounds at bases with scattered crackles Cardiovascular system: S1 & S2 heard, Rate controlled Gastrointestinal system: Abdomen is nondistended, soft and nontender. Normal bowel sounds heard. Extremities: No cyanosis, clubbing, edema  Central nervous system: Alert, slow to respond, poor historian.  No focal neurological deficits. Moving extremities Skin: No rashes, lesions or ulcers Psychiatry: Flat affect.  Not agitated    Data Reviewed: I have personally reviewed following labs and imaging studies  CBC: Recent Labs  Lab 12/26/24 1608 12/27/24 0516  WBC 5.8 4.7  NEUTROABS 4.0  --   HGB 8.9* 9.1*  HCT 28.7* 28.9*  MCV 81.8 79.4*  PLT 193 207   Basic Metabolic Panel: Recent Labs  Lab 12/26/24 1608 12/27/24 0516 12/28/24 0451  NA 133* 136 137  K 4.4 4.7 3.8  CL 99 101 99  CO2 21* 25 29  GLUCOSE 108* 80 77  BUN 42* 36* 37*  CREATININE 1.30* 1.27* 1.34*  CALCIUM  9.3 9.4 9.0  MG  --   --  2.4   GFR: Estimated Creatinine Clearance: 42.5 mL/min (A) (by C-G formula based on SCr of 1.34 mg/dL (H)). Liver Function Tests: Recent Labs  Lab 12/27/24 0516  AST 32  ALT 25  ALKPHOS 89  BILITOT 0.3  PROT 6.6  ALBUMIN 3.6   No results for input(s): LIPASE, AMYLASE in the last 168 hours. No results for input(s): AMMONIA in the last 168 hours. Coagulation Profile: Recent Labs  Lab 12/27/24 0516  INR 1.2   Cardiac Enzymes: No results for input(s): CKTOTAL, CKMB, CKMBINDEX, TROPONINI in the last 168 hours. BNP (last 3 results) Recent Labs    05/26/24 1648 06/18/24 1720 12/26/24 1608  PROBNP 3,172.0*  2,730.0* 2,011.0*   HbA1C: No results for input(s): HGBA1C in the last 72 hours. CBG: No results for input(s): GLUCAP in the last 168 hours. Lipid Profile: No results for input(s): CHOL, HDL, LDLCALC, TRIG, CHOLHDL, LDLDIRECT in the last 72 hours. Thyroid  Function Tests: No results for input(s): TSH, T4TOTAL, FREET4, T3FREE, THYROIDAB in the last 72 hours. Anemia Panel: No results for input(s): VITAMINB12, FOLATE, FERRITIN, TIBC, IRON, RETICCTPCT in the last 72 hours. Sepsis Labs: No results for input(s): PROCALCITON, LATICACIDVEN in the last 168 hours.  No results found for this or any previous visit (from the past 240 hours).       Radiology Studies: CT Cervical Spine Wo Contrast Result Date: 12/26/2024 EXAM: CT CERVICAL SPINE WITHOUT CONTRAST 12/26/2024 04:56:09 PM TECHNIQUE: CT of the cervical spine was performed without the administration of intravenous contrast. Multiplanar reformatted images are provided for review. Automated exposure control, iterative reconstruction, and/or weight based adjustment of the mA/kV was utilized to reduce the radiation dose to as low as reasonably achievable. COMPARISON: CT cervical spine 12/03/2024. CLINICAL HISTORY: fall on thinners FINDINGS: The examination is mildly motion degraded. BONES AND ALIGNMENT: Chronic straightening of the normal cervical lordosis with unchanged trace anterolisthesis of C2 on C3, 8 mm anterolisthesis of C6 on C7, and 4 mm anterolisthesis of C7 on T1. Previous C6-C7 ACDF and extensive posterior fusion extending from the occiput to T1. Osseous fusion of the vertebral bodies and posterior elements is present throughout the cervical spine, as well as fusion between C1 and C2 and fusion of C1 to the skull base. No acute fracture or suspicious lesion. No high grade osseous spinal canal stenosis. SOFT TISSUES: No prevertebral soft tissue swelling. LUNGS: Partially visualized persistent small  right pleural effusion. IMPRESSION: 1. No acute cervical spine fracture or traumatic malalignment. Electronically signed by: Dasie Hamburg MD MD 12/26/2024 05:24 PM EST RP Workstation: [id number]   DG Chest 2 View Result Date: 12/26/2024 CLINICAL DATA:  Shortness of breath.  Left shoulder pain. EXAM: LEFT SHOULDER - 2+ VIEW; CHEST - 2 VIEW COMPARISON:  12/03/2024. FINDINGS: Left shoulder: There is no evidence of acute fracture or dislocation. Severe degenerative changes are noted at the left shoulder. There is chronic elevation of the humeral head, suggesting with chronic rotator cuff tear. Cervical spinal fusion hardware is noted. Chest: The heart is enlarged and the mediastinal contour is within normal limits. A TAVR stent is noted. Small layering pleural effusion is noted on the right. No consolidation or pneumothorax is seen. Degenerative changes are present in the thoracic spine with spinal fusion hardware in the lower thoracic spine and anterior and posterior cervical spinal fusion hardware. There is evidence of prior rotator cuff surgery on the right. No acute osseous abnormality. IMPRESSION: 1. Severe degenerative changes at the left shoulder with no acute fracture or dislocation. 2. High riding humeral head suggesting chronic rotator cuff tear. 3. Suspected small layering pleural effusion on the right. Electronically Signed   By: Leita Birmingham M.D.   On: 12/26/2024 17:20   DG Shoulder Left  Result Date: 12/26/2024 CLINICAL DATA:  Shortness of breath.  Left shoulder pain. EXAM: LEFT SHOULDER - 2+ VIEW; CHEST - 2 VIEW COMPARISON:  12/03/2024. FINDINGS: Left shoulder: There is no evidence of acute fracture or dislocation. Severe degenerative changes are noted at the left shoulder. There is chronic elevation of the humeral head, suggesting with chronic rotator cuff tear. Cervical spinal fusion hardware is noted. Chest: The heart is enlarged and the mediastinal contour is within normal limits. A TAVR stent is  noted. Small layering pleural effusion is noted on the right. No consolidation or pneumothorax is seen. Degenerative changes are present in the thoracic spine with spinal fusion hardware in the lower thoracic spine and anterior and posterior cervical spinal fusion hardware. There is evidence of prior rotator cuff surgery on the right. No acute osseous abnormality. IMPRESSION: 1. Severe degenerative changes at the left shoulder with no acute fracture or dislocation. 2. High riding humeral head suggesting chronic rotator cuff tear. 3. Suspected small layering pleural effusion on the right. Electronically Signed   By: Leita Birmingham M.D.   On: 12/26/2024 17:20   CT Head Wo Contrast Result Date: 12/26/2024 EXAM: CT HEAD WITHOUT CONTRAST 12/26/2024 04:56:09 PM TECHNIQUE: CT of the head was performed without the administration of intravenous contrast. Automated exposure control, iterative reconstruction, and/or weight based adjustment of the mA/kV was utilized to reduce the radiation dose to as low as reasonably achievable. COMPARISON: Head CT 12/03/2024 and MRI 08/19/2024. CLINICAL HISTORY: fall on thinners FINDINGS: The examination is mildly motion degraded. BRAIN AND VENTRICLES: There is no evidence of an acute infarct, intracranial hemorrhage, mass, midline shift, hydrocephalus, or extra-axial fluid collection. There is moderate cerebral atrophy with unchanged mildly asymmetric enlargement of the subarachnoid spaces over the left frontal lobe. Cerebral white matter hypodensities are similar to the prior CT and nonspecific but compatible with mild chronic small vessel ischemic disease. Calcified atherosclerosis at the skull base. ORBITS: Bilateral cataract extraction. SINUSES: New moderately extensive left greater than right ethmoid air cell calcification and mild mucosal thickening within the sphenoid and included portions of the maxillary sinuses. Suspected small amount of fluid in the left maxillary sinus. Clear  mastoid air cells. SOFT TISSUES AND SKULL: Partially visualized spinal fusion extending to the occiput. No acute soft tissue abnormality. No skull fracture. IMPRESSION: 1. No acute intracranial abnormality. 2. Sinusitis. Electronically signed by: Dasie Hamburg MD MD 12/26/2024 05:17 PM EST RP Workstation: [id number]        Scheduled Meds:  apixaban   5 mg Oral BID   buPROPion   100 mg Oral BID   furosemide   80 mg Intravenous BID   lidocaine   1 patch Transdermal Q24H   metoprolol  succinate  25 mg Oral Daily   QUEtiapine   25 mg Oral QHS   rOPINIRole   0.25 mg Oral BID   tamsulosin   0.4 mg Oral Daily   topiramate   25 mg Oral BID   Continuous Infusions:        Sophie Mao, MD [facility] 12/28/2024, 7:54 AM   "

## 2024-12-28 NOTE — Progress Notes (Incomplete)
 Went to room 1411 to get pt for H@H  program. Pt was lying in the bed sleeping. Pt was awoken by this EMT and a NT on the floor. The pt was told that
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
## 2024-12-28 NOTE — Evaluation (Signed)
 Physical Therapy Evaluation Patient Details Name: [name] MRN: [id number] DOB: 1941/07/07 Today's Date: 12/28/2024  History of Present Illness  Pt is an 83y.o. male admitted on 12/26/24 due to a fall to the ground while getting into his car. PMH: HLD, HTN, OA, CHF, a-fib, CVA. Imaging negative for any intracranial bleeding.  Clinical Impression  PTA, pt was living with his sister and IND with all mobility using SPC, driving, and assisting with some IADLs. He now requires CGA-MIN A for bed mobility, transfers and short distance walking using a RW. He has necessary equipment at home and good support from his sister. He is limited by decreased functional strength and endurance, and mild balance deficits. He is a good candidate for [facility] with follow up HHPT once discharged.         If plan is discharge home, recommend the following: A little help with walking and/or transfers;A little help with bathing/dressing/bathroom;Assistance with cooking/housework;Assist for transportation   Can travel by private vehicle        Equipment Recommendations None recommended by PT  Recommendations for Other Services       Functional Status Assessment Patient has had a recent decline in their functional status and demonstrates the ability to make significant improvements in function in a reasonable and predictable amount of time.     Precautions / Restrictions Precautions Precautions: Fall Recall of Precautions/Restrictions: Intact Restrictions Weight Bearing Restrictions Per Provider Order: No      Mobility  Bed Mobility Overal bed mobility: Needs Assistance Bed Mobility: Supine to Sit     Supine to sit: Contact guard     General bed mobility comments: completed on flat bed to replicate home environment, required increased time and effort with CGA to ensure pt didn't lose balance backwards. Mild dizziness reported in sitting, resolves with time    Transfers Overall transfer  level: Needs assistance Equipment used: Rolling walker (2 wheels) Transfers: Sit to/from Stand Sit to Stand: Min assist           General transfer comment: MIN A to stand from low bed surface    Ambulation/Gait Ambulation/Gait assistance: Contact guard assist Gait Distance (Feet): 45 Feet Assistive device: Rolling walker (2 wheels)   Gait velocity: decreased     General Gait Details: slow cadence, UE support on RW, decr step height and length BLE,  Stairs            Wheelchair Mobility     Tilt Bed    Modified Rankin (Stroke Patients Only)       Balance Overall balance assessment: Needs assistance Sitting-balance support: Feet supported Sitting balance-Leahy Scale: Fair     Standing balance support: During functional activity, Reliant on assistive device for balance, Bilateral upper extremity supported Standing balance-Leahy Scale: Poor Standing balance comment: initial posterior lean when standing requiring MIN A, able to maintain balance standing otherwise with use of RW                             Pertinent Vitals/Pain Pain Assessment Pain Assessment: Faces Faces Pain Scale: Hurts even more Pain Location: generalized pain, L shoulder Pain Descriptors / Indicators: Dull, Grimacing, Discomfort Pain Intervention(s): Limited activity within patient's tolerance, Monitored during session, Repositioned    Home Living Family/patient expects to be discharged to:: Private residence Living Arrangements: Other relatives (sister) Available Help at Discharge: Family;Available 24 hours/day Type of Home: House Home Access: Level entry  Home Layout: One level Home Equipment: Wheelchair - Forensic Psychologist (2 wheels);Cane - single point;Rollator (4 wheels) Additional Comments: Pt lives with sister, available 24/7    Prior Function Prior Level of Function : Independent/Modified Independent             Mobility Comments: reports  ambulating with cane ADLs Comments: reports driving and independence     Extremity/Trunk Assessment   Upper Extremity Assessment Upper Extremity Assessment: Defer to OT evaluation    Lower Extremity Assessment Lower Extremity Assessment: Generalized weakness;RLE deficits/detail;LLE deficits/detail RLE Deficits / Details: MMT grossly 4-/5 RLE Sensation: WNL LLE Deficits / Details: MMT grossly 4-/5 LLE Sensation: WNL    Cervical / Trunk Assessment Cervical / Trunk Assessment: Normal  Communication   Communication Communication: No apparent difficulties    Cognition Arousal: Alert Behavior During Therapy: WFL for tasks assessed/performed                             Following commands: Intact       Cueing Cueing Techniques: Verbal cues, Tactile cues     General Comments      Exercises     Assessment/Plan    PT Assessment Patient needs continued PT services  PT Problem List Decreased strength;Decreased activity tolerance;Decreased balance       PT Treatment Interventions Therapeutic activities;Therapeutic exercise;Functional mobility training;Gait training    PT Goals (Current goals can be found in the Care Plan section)  Acute Rehab PT Goals Patient Stated Goal: go home PT Goal Formulation: With patient Time For Goal Achievement: 01/11/25 Potential to Achieve Goals: Good    Frequency Min 2X/week     Co-evaluation               AM-PAC PT 6 Clicks Mobility  Outcome Measure Help needed turning from your back to your side while in a flat bed without using bedrails?: A Little Help needed moving from lying on your back to sitting on the side of a flat bed without using bedrails?: A Little Help needed moving to and from a bed to a chair (including a wheelchair)?: A Little Help needed standing up from a chair using your arms (e.g., wheelchair or bedside chair)?: A Little Help needed to walk in hospital room?: A Little Help needed climbing  3-5 steps with a railing? : A Little 6 Click Score: 18    End of Session Equipment Utilized During Treatment: Gait belt Activity Tolerance: Patient tolerated treatment well;Patient limited by fatigue Patient left: in chair;with call bell/phone within reach;with chair alarm set Nurse Communication: Mobility status PT Visit Diagnosis: Unsteadiness on feet (R26.81);Muscle weakness (generalized) (M62.81)    Time: 1120-1150 PT Time Calculation (min) (ACUTE ONLY): 30 min   Charges:   PT Evaluation $PT Eval Low Complexity: 1 Low             Hue Frick H. Lynde Ludwig, PT, DPT   [employer] 12/28/2024, 12:01 PM

## 2024-12-28 NOTE — Progress Notes (Signed)
 Mobility Specialist - Progress Note   12/28/24 1354  Mobility  Activity Pivoted/transferred from chair to bed  Level of Assistance Minimal assist, patient does 75% or more  Assistive Device Front wheel walker  Distance Ambulated (ft) 3 ft  Range of Motion/Exercises Active  Activity Response Tolerated well  Mobility visit 1 Mobility  Mobility Specialist Start Time (ACUTE ONLY) 1344  Mobility Specialist Stop Time (ACUTE ONLY) 1354  Mobility Specialist Time Calculation (min) (ACUTE ONLY) 10 min   Pt was requesting assistance back to bed. C/o neck pain and notified RN pt requested pain medication. At EOS was left in bed with all needs met. Call bell in reach.   [name] Leos,  Mobility Specialist Can be reached via Secure Chat

## 2024-12-29 LAB — BASIC METABOLIC PANEL WITH GFR
Anion gap: 6 (ref 5–15)
BUN: 35 mg/dL — ABNORMAL HIGH (ref 8–23)
CO2: 29 mmol/L (ref 22–32)
Calcium: 9.2 mg/dL (ref 8.9–10.3)
Chloride: 101 mmol/L (ref 98–111)
Creatinine, Ser: 1.23 mg/dL (ref 0.61–1.24)
GFR, Estimated: 59 mL/min — ABNORMAL LOW
Glucose, Bld: 81 mg/dL (ref 70–99)
Potassium: 3.8 mmol/L (ref 3.5–5.1)
Sodium: 136 mmol/L (ref 135–145)

## 2024-12-29 MED ORDER — SODIUM CHLORIDE 0.9 % IV BOLUS
500.0000 mL | Freq: Once | INTRAVENOUS | Status: AC
  Administered 2024-12-29: 500 mL via INTRAVENOUS

## 2024-12-29 MED ORDER — ONDANSETRON HCL 4 MG PO TABS: 4.0000 mg | ORAL_TABLET | Freq: Four times a day (QID) | ORAL | Status: DC | PRN

## 2024-12-29 MED ORDER — LACTATED RINGERS IV BOLUS
1000.0000 mL | Freq: Once | INTRAVENOUS | Status: AC
  Administered 2024-12-29: 1000 mL via INTRAVENOUS

## 2024-12-29 MED ORDER — SENNOSIDES-DOCUSATE SODIUM 8.6-50 MG PO TABS
1.0000 | ORAL_TABLET | Freq: Every day | ORAL | Status: DC
  Administered 2024-12-29: 1 via ORAL
  Filled 2024-12-29: qty 1

## 2024-12-29 MED ORDER — ORAL CARE MOUTH RINSE: 15.0000 mL | OROMUCOSAL | Status: DC | PRN

## 2024-12-29 NOTE — Progress Notes (Signed)
 Heart Failure Navigator Progress Note  Assessed for Heart & Vascular TOC clinic readiness.  Patient previously stated he is established with cardiology at the [facility]. No TOC.   Navigator available for reassessment of patient.   Duwaine Plant, PharmD, BCPS Heart Failure Stewardship Pharmacist Phone [phone]

## 2024-12-29 NOTE — Progress Notes (Signed)
 Physical Therapy Treatment Patient Details Name: [name] MRN: [id number] DOB: 22-Apr-1941 Today's Date: 12/29/2024   History of Present Illness Pt is an 83y.o. male admitted on 12/26/24 due to a fall to the ground while getting into his car. PMH: HLD, HTN, OA, CHF, a-fib, CVA. Imaging negative for any intracranial bleeding.    PT Comments  Pt tolerated increased ambulation distance of 33' with RW, no loss of balance. Duration of PT session limited by pt's need to use the bathroom for an extended period of time.     If plan is discharge home, recommend the following: A little help with walking and/or transfers;A little help with bathing/dressing/bathroom;Assistance with cooking/housework;Assist for transportation   Can travel by private vehicle        Equipment Recommendations  None recommended by PT    Recommendations for Other Services       Precautions / Restrictions Precautions Precautions: Fall Recall of Precautions/Restrictions: Intact Restrictions Weight Bearing Restrictions Per Provider Order: No     Mobility  Bed Mobility Overal bed mobility: Modified Independent Bed Mobility: Rolling, Sidelying to Sit Rolling: Modified independent (Device/Increase time) Sidelying to sit: Modified independent (Device/Increase time), HOB elevated, Used rails       General bed mobility comments: HOB up, used rail, increased time    Transfers Overall transfer level: Needs assistance Equipment used: Rolling walker (2 wheels) Transfers: Sit to/from Stand Sit to Stand: Contact guard assist           General transfer comment: VCs hand placement    Ambulation/Gait Ambulation/Gait assistance: Contact guard assist Gait Distance (Feet): 90 Feet Assistive device: Rolling walker (2 wheels) Gait Pattern/deviations: Step-through pattern, Decreased stride length Gait velocity: decreased     General Gait Details: slow cadence, UE support on RW, decr step height and length BLE,  no loss of balance   Stairs             Wheelchair Mobility     Tilt Bed    Modified Rankin (Stroke Patients Only)       Balance Overall balance assessment: Needs assistance Sitting-balance support: Feet supported Sitting balance-Leahy Scale: Good Sitting balance - Comments: able to reach to feet to pull up socks sitting EOB   Standing balance support: During functional activity, Reliant on assistive device for balance, Bilateral upper extremity supported Standing balance-Leahy Scale: Fair Standing balance comment: steady with RW                            Communication Communication Communication: No apparent difficulties Factors Affecting Communication: Hearing impaired  Cognition Arousal: Alert Behavior During Therapy: WFL for tasks assessed/performed                             Following commands: Intact      Cueing Cueing Techniques: Verbal cues, Tactile cues  Exercises      General Comments        Pertinent Vitals/Pain Pain Assessment Faces Pain Scale: Hurts even more Pain Location: neck Pain Descriptors / Indicators: Dull, Grimacing, Discomfort Pain Intervention(s): Limited activity within patient's tolerance, Monitored during session, Premedicated before session    Home Living                          Prior Function            PT Goals (current goals can  now be found in the care plan section) Acute Rehab PT Goals Patient Stated Goal: go home PT Goal Formulation: With patient Time For Goal Achievement: 01/11/25 Potential to Achieve Goals: Good Progress towards PT goals: Progressing toward goals    Frequency    Min 3X/week      PT Plan      Co-evaluation              AM-PAC PT 6 Clicks Mobility   Outcome Measure  Help needed turning from your back to your side while in a flat bed without using bedrails?: None Help needed moving from lying on your back to sitting on the side of a  flat bed without using bedrails?: A Little Help needed moving to and from a bed to a chair (including a wheelchair)?: None Help needed standing up from a chair using your arms (e.g., wheelchair or bedside chair)?: None Help needed to walk in hospital room?: None Help needed climbing 3-5 steps with a railing? : A Little 6 Click Score: 22    End of Session Equipment Utilized During Treatment: Gait belt Activity Tolerance: Patient tolerated treatment well;Patient limited by fatigue Patient left: Other (comment);with call bell/phone within reach (on toilet) Nurse Communication: Mobility status PT Visit Diagnosis: Unsteadiness on feet (R26.81);Muscle weakness (generalized) (M62.81)     Time: 8555-8543 PT Time Calculation (min) (ACUTE ONLY): 12 min  Charges:    $Gait Training: 8-22 mins PT General Charges $$ ACUTE PT VISIT: 1 Visit                     Sylvan Delon Copp PT 12/29/2024  Acute Rehabilitation Services  Office [phone]

## 2024-12-29 NOTE — Evaluation (Signed)
 Clinical/Bedside Swallow Evaluation Patient Details  Name: [name] MRN: [id number] Date of Birth: December 15, 1941  Today's Date: 12/29/2024 Time: SLP Start Time (ACUTE ONLY): 1336 SLP Stop Time (ACUTE ONLY): 1400 SLP Time Calculation (min) (ACUTE ONLY): 24 min  Past Medical History:  Past Medical History:  Diagnosis Date   Cancer (HCC)    Carpal tunnel syndrome    Cervical spondylosis with myelopathy    Chronic pain syndrome    Depression    Difficult intubation    needs smaller tube; awake oral fiberoptic scope 8.0 ETT 03/30/08   Dysphagia, pharyngeal phase    Hemorrhage of gastrointestinal tract, unspecified    History of cardiac cath    a. 03/2018 Cath: LAD and LCX with mild luminal irregularities.  No significant disease.   Iron deficiency anemia, unspecified    Lumbosacral spondylosis without myelopathy    Muscle weakness (generalized)    Other and unspecified disc disorder of cervical region    Other and unspecified hyperlipidemia    Patent foramen ovale    a. 05/2018 Echo: post-op TAVR--> + bublble study w/ L->R shunt.   Permanent atrial fibrillation (HCC)    a.  Diagnosed in the spring 2019.  Had rapid atrial fibrillation following TAVR and has been rate controlled with beta-blocker.  CHA2DS2VASc equals 7.  Supposed to be on Eliquis .   Primary localized osteoarthrosis, lower leg    Primary localized osteoarthrosis, shoulder region    Reflux esophagitis    Severe aortic stenosis    a. mild-mod AS by 01/2016 TTE, restricted AV opening with mod AR 01/2016 TEE; b. 10/2017 Echo: EF 60-65%, sev Ca2+ AoV, mean grad ; c. 05/2018 s/p TAVR ([facility]); d. 06/2018 Echo: EF 55-60%, well-seated prosth AoV, peak velocity 156cm/s. AoV gradient .   Spinal stenosis, unspecified region other than cervical    Stroke (HCC)    01/2016   Syncope and collapse    Unspecified arthropathy, lower leg    Unspecified constipation    Unspecified essential hypertension    Unspecified glaucoma(365.9)     Past Surgical History:  Past Surgical History:  Procedure Laterality Date   BILATERAL CATARACT SURGERY  2009   DR GROAT    BIOPSY  10/14/2018   Procedure: BIOPSY;  Surgeon: Lennard Lesta FALCON, MD;  Location: [facility] ENDOSCOPY;  Service: Endoscopy;;   CARDIAC VALVE REPLACEMENT  05/2018   CERVIACAL SPINE (4X)     DR MARK ROY    COLONOSCOPY  2007   DR HENSEL    COLONOSCOPY WITH PROPOFOL  N/A 11/20/2017   Procedure: COLONOSCOPY WITH PROPOFOL ;  Surgeon: Dianna Specking, MD;  Location: [facility] ENDOSCOPY;  Service: Endoscopy;  Laterality: N/A;   ESOPHAGOGASTRODUODENOSCOPY (EGD) WITH PROPOFOL  N/A 10/14/2018   Procedure: ESOPHAGOGASTRODUODENOSCOPY (EGD) WITH PROPOFOL ;  Surgeon: Lennard Lesta FALCON, MD;  Location: [facility] ENDOSCOPY;  Service: Endoscopy;  Laterality: N/A;   JOINT REPLACEMENT     both knees   LEFT KNEE REPLACEMENT     DR ALUSIO   LEFT TRANSVERSE CARPAL LIGAMENT  01/06/2008   ROTATOR CUFF LEFT SHOULDER  2001   DR MURPHY    SHOULDER OPEN ROTATOR CUFF REPAIR  2006   DR [facility]   SPINE SURGERY     neck fusion   HPI:  [name] is an 84 y.o. male admitted on 12/26/24 due to a fall to the ground while getting into his car. Imaging negative for any intracranial bleeding. Pt with prior swallow studies September 2024 due to recurrent PNA. MBS was [facility]; esophagram  showed esophageal dysmotility with gastroesophageal reflux. SLP clinical swallow eval 03/2024 recommended GI consult if there are ongoing issues with GERD/pna. Baseline cough; RN states he reported difficulty swallowing this am. Regurgitated beef at lunch.  PMH: HTN, CAD, sp TAVR, afib on eliquis , cva, multiple back surgeries, ACDF.  Chart lists pharyngeal dysphagia in hx, but upon review it should state esophageal history.    Assessment / Plan / Recommendation  Clinical Impression  PLAN: Continue regular solids; thin liquids. Pt may benefit from referral for OP GI f/u if GERD/dysmotility have become more difficult to manage.    Pt was  irritable and reluctant to participate in clinical swallow evaluation.  He had a baseline, occasional cough prior to PO intake.  Oral mechanism exam was normal.  He drank water/juice, swallowed a pill with liquid, self fed applesauce and cracker with intermittent, mild cough present regardless of PO consumption. He described throwing up after eating beef for lunch.  He has a hx of dysmotility - suspect esophageal issues are the primary impact.  D/W RN.  Continue current diet; consider GI eval, either while here or as OP.    SLP Visit Diagnosis: Dysphagia, unspecified (R13.10)    Aspiration Risk       Diet Recommendation   Thin;Age appropriate regular  Medication Administration: Whole meds with liquid    Other Recommendations Recommended Consults: Consider GI evaluation Oral Care Recommendations: Oral care BID     Swallow Evaluation Recommendations     Assistance Recommended at Discharge    Functional Status Assessment    Frequency and Duration            Prognosis        Swallow Study   General Date of Onset: 12/27/24 HPI: [name] is an 84 y.o. male admitted on 12/26/24 due to a fall to the ground while getting into his car. Imaging negative for any intracranial bleeding. Pt with prior swallow studies  September 2024 due to recurrent PNA. MBS was [facility]; esophagram showed esophageal dysmotility with gastroesophageal reflux. SLP eval 03/2024 recommended GI consult if there are ongoing issues with GERD/pna. Baseline cough; RN states he reported difficulty swallowing this am. Regurgitated beef at lunch.  PMH: HTN, CAD, sp TAVR, afib on eliquis , cva, multiple back surgeries, ACDF. Type of Study: Bedside Swallow Evaluation Previous Swallow Assessment: See HPI Diet Prior to this Study: Regular;Thin liquids (Level 0) Temperature Spikes Noted: No Respiratory Status: Room air History of Recent Intubation: No Behavior/Cognition: Alert Oral Cavity Assessment: Within Functional  Limits Oral Care Completed by SLP: No Oral Cavity - Dentition: Adequate natural dentition Vision: Functional for self-feeding Self-Feeding Abilities: Able to feed self Patient Positioning: Upright in bed Baseline Vocal Quality: Normal Volitional Cough: Strong Volitional Swallow: Able to elicit    Oral/Motor/Sensory Function Overall Oral Motor/Sensory Function: Within functional limits   Ice Chips Ice chips: Not tested   Thin Liquid Thin Liquid: Within functional limits    Nectar Thick Nectar Thick Liquid: Not tested   Honey Thick Honey Thick Liquid: Not tested   Puree Puree: Within functional limits   Solid     Solid: Within functional limits      Vona Palma Laurice 12/29/2024,2:17 PM  Palma L. Vona, MA CCC/SLP Clinical Specialist - Acute Care SLP Acute Rehabilitation Services Office number [phone]

## 2024-12-29 NOTE — TOC Initial Note (Signed)
 Transition of Care [facility]) - Initial/Assessment Note    Patient Details  Name: [name] MRN: [id number] Date of Birth: 09/08/1941  Transition of Care [facility]) CM/SW Contact:    Bascom Service, RN Phone Number: 12/29/2024, 1:45 PM  Clinical Narrative: [facility] chosen for HHPT/OT rep Cindie aware. Noted [facility] for transfer home.                  Expected Discharge Plan: Home w Home Health Services Barriers to Discharge: Continued Medical Work up   Patient Goals and CMS Choice Patient states their goals for this hospitalization and ongoing recovery are:: Home CMS Medicare.gov Compare Post Acute Care list provided to:: Patient Choice offered to / list presented to : Patient      Expected Discharge Plan and Services   Discharge Planning Services: CM Consult Post Acute Care Choice: Home Health Living arrangements for the past 2 months: Single Family Home                           HH Arranged: PT, OT HH Agency: [facility] Date [facility] Agency Contacted: 12/29/24 Time HH Agency Contacted: [phone] Representative spoke with at [facility] Agency: cindie  Prior Living Arrangements/Services Living arrangements for the past 2 months: Single Family Home Lives with:: Relatives   Do you feel safe going back to the place where you live?: Yes          Current home services: DME (cane,rw)    Activities of Daily Living   ADL Screening (condition at time of admission) Independently performs ADLs?: Yes (appropriate for developmental age) Is the patient deaf or have difficulty hearing?: No Does the patient have difficulty seeing, even when wearing glasses/contacts?: No Does the patient have difficulty concentrating, remembering, or making decisions?: No  Permission Sought/Granted Permission sought to share information with : Case Manager Permission granted to share information with : Yes, Verbal Permission Granted              Emotional Assessment              Admission  diagnosis:  CHF (congestive heart failure) (HCC) [I50.9] Acute on chronic congestive heart failure, unspecified heart failure type (HCC) [I50.9] Tear of left rotator cuff, unspecified tear extent, unspecified whether traumatic [M75.102] Patient Active Problem List   Diagnosis Date Noted   CHF (congestive heart failure) (HCC) 12/26/2024   Encephalopathy acute 08/19/2024   Generalized weakness 08/16/2024   Acute on chronic respiratory failure with hypoxia (HCC) 05/27/2024   Persistent atrial fibrillation (HCC) 05/27/2024   Elevated troponin 05/27/2024   CAD (coronary artery disease) 05/27/2024   Hyperlipidemia 05/27/2024   BPH (benign prostatic hyperplasia) 05/27/2024   Dizziness 05/26/2024   Aspiration pneumonia (HCC) 03/16/2024   Normocytic anemia 03/16/2024   Acute exacerbation of CHF (congestive heart failure) (HCC) 03/15/2024   Atrial fibrillation with RVR (HCC) 01/30/2024   Rib fracture 11/30/2023   Pneumothorax 11/30/2023   PNA (pneumonia) 10/27/2023   Community acquired pneumonia of left lower lobe of lung 10/26/2023   Vitamin B 12 deficiency 03/22/2022   Schizoaffective disorder (HCC) 03/22/2022   Hypotension 11/04/2021   Laceration of forehead 11/04/2021   Chronic diastolic CHF (congestive heart failure) (HCC) 11/04/2021   Renal cyst 11/04/2021   Hyperkalemia 11/04/2021   Fall 11/03/2021   Palliative care by specialist    Goals of care, counseling/discussion    UTI (urinary tract infection) 11/10/2019   AF (paroxysmal atrial fibrillation) (HCC)  11/10/2019   Acute blood loss anemia 11/10/2019   Cellulitis 11/10/2019   AKI (acute kidney injury) 11/10/2019   History of CVA (cerebrovascular accident) 11/10/2019   Abdominal pain 10/07/2019   Pressure injury of skin 10/07/2019   Atrial fibrillation with rapid ventricular response (HCC) 10/06/2019   Cerebral infarction (HCC) 09/22/2019   S/P cervical spinal fusion 08/04/2019   Acute encephalopathy    GI bleeding  10/13/2018   Acute metabolic encephalopathy 10/13/2018   Acute renal failure (ARF) 10/13/2018   S/P TAVR (transcatheter aortic valve replacement) 09/20/2018   Permanent atrial fibrillation (HCC) 09/20/2018   Acute on chronic diastolic CHF (congestive heart failure) (HCC) 09/18/2018   Heme positive stool 11/20/2017   Gait disorder 03/24/2013   Cervical post-laminectomy syndrome 03/26/2012   Depression 03/26/2012   Degenerative arthritis of shoulder region 03/26/2012   KNEE REPLACEMENT, BILATERAL, HX OF 06/04/2007   CELLULITIS/ABSCESS, ARM 05/05/2007   Anxiety state 04/08/2007   Chronic pain 04/08/2007   OSTEOARTHROSIS, GENERALIZED, MULTIPLE SITES 04/08/2007   SPONDYLOLISTHESIS 04/08/2007   INCONTINENCE, URGE 04/08/2007   HYPERCHOLESTEROLEMIA 03/07/2007   HYPERTENSION, BENIGN 03/07/2007   PCP:  [name], MD Pharmacy:   [facility] [address] [place] - [address] [place] [address] Phone: [phone] Fax: [phone]  [facility] [address] [place] [address] Phone: [phone] Fax: [phone]  [facility] [address] [place] [address] Phone: [phone] Fax: [phone]  [id number] [place], [place] - [address] [facility] [address] [place] [address] Phone: [phone] Fax: [phone]     Social Drivers of Health (SDOH) Social History: SDOH Screenings   Food Insecurity: No Food Insecurity (12/26/2024)  Housing: Low Risk (12/26/2024)  Transportation Needs: No Transportation Needs (12/26/2024)  Utilities: Not At Risk (12/26/2024)  Social Connections: Moderately Isolated (12/26/2024)  Tobacco Use: Medium Risk (12/27/2024)   SDOH Interventions:     Readmission Risk Interventions    03/16/2024   12:59 PM 01/31/2024   11:58 AM 10/27/2023   11:13 AM   Readmission Risk Prevention Plan  Transportation Screening Complete Complete Complete  PCP or Specialist Appt within 5-7 Days Complete Complete Complete  Home Care Screening Complete Complete Complete  Medication Review (RN CM) Complete Complete Complete
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
## 2024-12-30 LAB — BASIC METABOLIC PANEL WITH GFR
Anion gap: 8 (ref 5–15)
BUN: 22 mg/dL (ref 8–23)
CO2: 25 mmol/L (ref 22–32)
Calcium: 9.2 mg/dL (ref 8.9–10.3)
Chloride: 105 mmol/L (ref 98–111)
Creatinine, Ser: 0.99 mg/dL (ref 0.61–1.24)
GFR, Estimated: >60 mL/min
Glucose, Bld: 84 mg/dL (ref 70–99)
Potassium: 4.1 mmol/L (ref 3.5–5.1)
Sodium: 139 mmol/L (ref 135–145)

## 2024-12-30 LAB — CBC
HCT: 30.5 % — ABNORMAL LOW (ref 39.0–52.0)
Hemoglobin: 9.3 g/dL — ABNORMAL LOW (ref 13.0–17.0)
MCH: 25 pg — ABNORMAL LOW (ref 26.0–34.0)
MCHC: 30.5 g/dL (ref 30.0–36.0)
MCV: 82 fL (ref 80.0–100.0)
Platelets: 249 K/uL (ref 150–400)
RBC: 3.72 MIL/uL — ABNORMAL LOW (ref 4.22–5.81)
RDW: 20.2 % — ABNORMAL HIGH (ref 11.5–15.5)
WBC: 5.6 K/uL (ref 4.0–10.5)
nRBC: 0 % (ref 0.0–0.2)

## 2024-12-30 MED ORDER — SENNOSIDES-DOCUSATE SODIUM 8.6-50 MG PO TABS: 2.0000 | ORAL_TABLET | Freq: Two times a day (BID) | ORAL | Status: AC

## 2024-12-30 MED ORDER — SENNOSIDES-DOCUSATE SODIUM 8.6-50 MG PO TABS
2.0000 | ORAL_TABLET | Freq: Two times a day (BID) | ORAL | Status: DC
  Administered 2024-12-30: 2 via ORAL
  Filled 2024-12-30: qty 2

## 2024-12-30 MED ORDER — HYDROCODONE-ACETAMINOPHEN 7.5-325 MG PO TABS: 1.0000 | ORAL_TABLET | Freq: Three times a day (TID) | ORAL | 0 refills | Status: AC | PRN

## 2024-12-30 NOTE — TOC Transition Note (Signed)
 Transition of Care [facility]) - Discharge Note   Patient Details  Name: [name] MRN: [id number] Date of Birth: 06/28/1941  Transition of Care [facility]) CM/SW Contact:  Bascom Service, RN Phone Number: 12/30/2024, 10:56 AM   Clinical Narrative:  d/c home w/HHC. PTAR called No further CM needs.     Final next level of care: Home w Home Health Services Barriers to Discharge: No Barriers Identified   Patient Goals and CMS Choice Patient states their goals for this hospitalization and ongoing recovery are:: Home CMS Medicare.gov Compare Post Acute Care list provided to:: Patient Choice offered to / list presented to : Patient      Discharge Placement                Patient to be transferred to facility by: PTAR Name of family member notified: [name](patient) Patient and family notified of of transfer: 12/30/24  Discharge Plan and Services Additional resources added to the After Visit Summary for     Discharge Planning Services: CM Consult Post Acute Care Choice: Home Health                    HH Arranged: PT, OT [facility] Agency: [facility] Date [facility] Agency Contacted: 12/29/24 Time HH Agency Contacted: [phone] Representative spoke with at [facility] Agency: cindie  Social Drivers of Health (SDOH) Interventions SDOH Screenings   Food Insecurity: No Food Insecurity (12/26/2024)  Housing: Low Risk (12/26/2024)  Transportation Needs: No Transportation Needs (12/26/2024)  Utilities: Not At Risk (12/26/2024)  Social Connections: Moderately Isolated (12/26/2024)  Tobacco Use: Medium Risk (12/27/2024)     Readmission Risk Interventions    03/16/2024   12:59 PM 01/31/2024   11:58 AM 10/27/2023   11:13 AM  Readmission Risk Prevention Plan  Transportation Screening Complete Complete Complete  PCP or Specialist Appt within 5-7 Days Complete Complete Complete  Home Care Screening Complete Complete Complete  Medication Review (RN CM) Complete Complete Complete

## 2024-12-30 NOTE — Discharge Summary (Signed)
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   UROBILINOGEN 0.2 04/24/2010 1617   NITRITE NEGATIVE 08/19/2024 2109   LEUKOCYTESUR LARGE (A) 08/19/2024 2109   Sepsis Labs: Invalid input(s): PROCALCITONIN, LACTICIDVEN   SIGNED:  Quinterious Walraven T Aryahi Denzler, MD  [facility] 12/30/2024, 6:35 PM   "

## 2024-12-30 NOTE — Progress Notes (Signed)
 Occupational Therapy Treatment Patient Details Name: [name] MRN: [id number] DOB: 1941/03/02 Today's Date: 12/30/2024   History of present illness Pt is an 83y.o. male admitted on 12/26/24 due to a fall to the ground while getting into his car. PMH: HLD, HTN, OA, CHF, a-fib, CVA. Imaging negative for any intracranial bleeding.   OT comments  Patient seen to discuss energy conservation and complete ADL tasks. Patient flat and minimally frustrated, requesting pain medication. Patient willing to sit EOB, and completing ADLs at set up level. Patient endorses he has assist from his sister at discharge and denies dizziness with movement. OT attempting transfer, however PTAR present in addition to RN. Education not provided in depth due to time constraints. OT recommendation remains appropriate, will continue to follow acutely.       If plan is discharge home, recommend the following:  A little help with walking and/or transfers;A little help with bathing/dressing/bathroom;Assistance with cooking/housework;Help with stairs or ramp for entrance;Assist for transportation   Equipment Recommendations  None recommended by OT    Recommendations for Other Services      Precautions / Restrictions Precautions Precautions: Fall Recall of Precautions/Restrictions: Intact Restrictions Weight Bearing Restrictions Per Provider Order: No       Mobility Bed Mobility Overal bed mobility: Modified Independent                  Transfers Overall transfer level: Needs assistance                 General transfer comment: unable to complete due to PTAR presence     Balance Overall balance assessment: Needs assistance Sitting-balance support: Feet supported Sitting balance-Leahy Scale: Good                                     ADL either performed or assessed with clinical judgement   ADL Overall ADL's : Needs assistance/impaired     Grooming: Wash/dry hands;Oral  care;Wash/dry face;Set up;Sitting                               Functional mobility during ADLs: Minimal assistance;Rolling walker (2 wheels) General ADL Comments: Patient seen to discuss energy conservation and complete ADL tasks. Patient flat and minimally frustrated, requesting pain medication. Patient willing to sit EOB, and completing ADLs at set up level. Patient endorses he has assist from his sister at discharge and denies dizziness with movement. OT attempting transfer, however PTAR present in addition to RN. Education not provided in depth due to time constraints. OT recommendation remains appropriate, will continue to follow acutely.    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[employer], [employer] Communication: No apparent difficulties   Cognition Arousal: Alert Behavior During Therapy: WFL for tasks assessed/performed Cognition: No apparent impairments                               Following commands: Intact        Cueing   Cueing Techniques: Verbal cues, Tactile cues  Exercises      Shoulder Instructions       General Comments      Pertinent Vitals/ Pain  Pain Assessment Pain Assessment: Faces Faces Pain Scale: Hurts even more Pain Location: did not state, perseverating on pain pill Pain Descriptors / Indicators: Dull, Grimacing, Discomfort Pain Intervention(s): Limited activity within patient's tolerance, Monitored during session, Repositioned, Patient requesting pain meds-RN notified  Home Living                                          Prior Functioning/Environment              Frequency  Min 2X/week        Progress Toward Goals  OT Goals(current goals can now be found in the care plan section)  Progress towards OT goals: Progressing toward goals  Acute Rehab OT Goals Patient Stated Goal: to go home OT Goal Formulation: With  patient Time For Goal Achievement: 01/11/25 Potential to Achieve Goals: Good  Plan      Co-evaluation                 AM-PAC OT 6 Clicks Daily Activity     Outcome Measure   Help from another person eating meals?: A Little Help from another person taking care of personal grooming?: A Little Help from another person toileting, which includes using toliet, bedpan, or urinal?: A Little Help from another person bathing (including washing, rinsing, drying)?: A Little Help from another person to put on and taking off regular upper body clothing?: A Little Help from another person to put on and taking off regular lower body clothing?: A Little 6 Click Score: 18    End of Session    OT Visit Diagnosis: Unsteadiness on feet (R26.81);Muscle weakness (generalized) (M62.81);Pain   Activity Tolerance Patient tolerated treatment well   Patient Left Other (comment);with nursing/sitter in room (sitting EOB with RN)   Nurse Communication Mobility status        Time: 8886-8871 OT Time Calculation (min): 15 min  Charges: OT General Charges $OT Visit: 1 Visit OT Treatments $Self Care/Home Management : 8-22 mins  Ronal Gift E. Robin Pafford, OTR/L Acute Rehabilitation Services [phone]   Ronal Gift Salt 12/30/2024, 11:41 AM
